# Patient Record
Sex: Male | Born: 1935
Health system: Southern US, Community
[De-identification: ages and names within clinical notes are randomized; demographics above are authoritative.]

## PROBLEM LIST (undated history)

## (undated) DIAGNOSIS — M549 Dorsalgia, unspecified: Secondary | ICD-10-CM

## (undated) DIAGNOSIS — I639 Cerebral infarction, unspecified: Secondary | ICD-10-CM

## (undated) DIAGNOSIS — Z8739 Personal history of other diseases of the musculoskeletal system and connective tissue: Secondary | ICD-10-CM

## (undated) DIAGNOSIS — R0600 Dyspnea, unspecified: Secondary | ICD-10-CM

## (undated) DIAGNOSIS — I10 Essential (primary) hypertension: Secondary | ICD-10-CM

## (undated) DIAGNOSIS — M2550 Pain in unspecified joint: Secondary | ICD-10-CM

## (undated) DIAGNOSIS — J189 Pneumonia, unspecified organism: Secondary | ICD-10-CM

## (undated) DIAGNOSIS — H9190 Unspecified hearing loss, unspecified ear: Secondary | ICD-10-CM

## (undated) DIAGNOSIS — Z8619 Personal history of other infectious and parasitic diseases: Secondary | ICD-10-CM

## (undated) DIAGNOSIS — I2510 Atherosclerotic heart disease of native coronary artery without angina pectoris: Secondary | ICD-10-CM

## (undated) DIAGNOSIS — K5790 Diverticulosis of intestine, part unspecified, without perforation or abscess without bleeding: Secondary | ICD-10-CM

## (undated) DIAGNOSIS — K52839 Microscopic colitis, unspecified: Secondary | ICD-10-CM

## (undated) DIAGNOSIS — K219 Gastro-esophageal reflux disease without esophagitis: Secondary | ICD-10-CM

## (undated) DIAGNOSIS — I959 Hypotension, unspecified: Secondary | ICD-10-CM

## (undated) DIAGNOSIS — E785 Hyperlipidemia, unspecified: Secondary | ICD-10-CM

## (undated) DIAGNOSIS — J449 Chronic obstructive pulmonary disease, unspecified: Secondary | ICD-10-CM

## (undated) DIAGNOSIS — C61 Malignant neoplasm of prostate: Secondary | ICD-10-CM

## (undated) DIAGNOSIS — N400 Enlarged prostate without lower urinary tract symptoms: Secondary | ICD-10-CM

## (undated) DIAGNOSIS — R739 Hyperglycemia, unspecified: Secondary | ICD-10-CM

## (undated) DIAGNOSIS — M1990 Unspecified osteoarthritis, unspecified site: Secondary | ICD-10-CM

## (undated) DIAGNOSIS — G459 Transient cerebral ischemic attack, unspecified: Secondary | ICD-10-CM

## (undated) DIAGNOSIS — I480 Paroxysmal atrial fibrillation: Secondary | ICD-10-CM

## (undated) DIAGNOSIS — H269 Unspecified cataract: Secondary | ICD-10-CM

## (undated) DIAGNOSIS — R531 Weakness: Secondary | ICD-10-CM

## (undated) HISTORY — DX: Hyperglycemia, unspecified: R73.9

## (undated) HISTORY — PX: EYE SURGERY: SHX253

## (undated) HISTORY — DX: Hyperlipidemia, unspecified: E78.5

## (undated) HISTORY — DX: Dorsalgia, unspecified: M54.9

## (undated) HISTORY — DX: Malignant neoplasm of prostate: C61

## (undated) HISTORY — DX: Hypotension, unspecified: I95.9

## (undated) HISTORY — PX: OTHER SURGICAL HISTORY: SHX169

## (undated) HISTORY — DX: Essential (primary) hypertension: I10

## (undated) HISTORY — DX: Transient cerebral ischemic attack, unspecified: G45.9

## (undated) HISTORY — DX: Gastro-esophageal reflux disease without esophagitis: K21.9

## (undated) HISTORY — DX: Diverticulosis of intestine, part unspecified, without perforation or abscess without bleeding: K57.90

## (undated) HISTORY — PX: COLONOSCOPY: SHX174

## (undated) HISTORY — DX: Personal history of other infectious and parasitic diseases: Z86.19

## (undated) HISTORY — DX: Atherosclerotic heart disease of native coronary artery without angina pectoris: I25.10

---
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
## 1995-12-19 HISTORY — PX: TOTAL HIP ARTHROPLASTY: SHX124

## 1998-12-18 DIAGNOSIS — M129 Arthropathy, unspecified: Secondary | ICD-10-CM | POA: Insufficient documentation

## 2000-12-18 DIAGNOSIS — K5730 Diverticulosis of large intestine without perforation or abscess without bleeding: Secondary | ICD-10-CM | POA: Insufficient documentation

## 2005-03-20 DIAGNOSIS — M4310 Spondylolisthesis, site unspecified: Secondary | ICD-10-CM | POA: Insufficient documentation

## 2007-05-10 ENCOUNTER — Other Ambulatory Visit: Payer: Self-pay

## 2007-05-10 ENCOUNTER — Emergency Department: Payer: Self-pay | Admitting: Emergency Medicine

## 2007-08-02 DIAGNOSIS — I10 Essential (primary) hypertension: Secondary | ICD-10-CM | POA: Insufficient documentation

## 2007-08-02 DIAGNOSIS — E785 Hyperlipidemia, unspecified: Secondary | ICD-10-CM | POA: Insufficient documentation

## 2007-09-10 ENCOUNTER — Ambulatory Visit: Payer: Self-pay | Admitting: Specialist

## 2008-03-04 ENCOUNTER — Ambulatory Visit: Payer: Self-pay | Admitting: Family Medicine

## 2008-03-11 ENCOUNTER — Ambulatory Visit: Payer: Self-pay | Admitting: Family Medicine

## 2008-09-28 DIAGNOSIS — J309 Allergic rhinitis, unspecified: Secondary | ICD-10-CM | POA: Insufficient documentation

## 2008-10-13 ENCOUNTER — Ambulatory Visit: Payer: Self-pay | Admitting: Family Medicine

## 2008-10-22 ENCOUNTER — Ambulatory Visit: Payer: Self-pay | Admitting: Family Medicine

## 2009-01-18 ENCOUNTER — Inpatient Hospital Stay: Payer: Self-pay | Admitting: Internal Medicine

## 2009-03-01 DIAGNOSIS — M545 Low back pain, unspecified: Secondary | ICD-10-CM | POA: Insufficient documentation

## 2009-03-17 ENCOUNTER — Ambulatory Visit: Payer: Self-pay | Admitting: Specialist

## 2009-04-07 ENCOUNTER — Ambulatory Visit: Payer: Self-pay | Admitting: Anesthesiology

## 2009-05-06 ENCOUNTER — Ambulatory Visit: Payer: Self-pay | Admitting: Anesthesiology

## 2009-06-02 ENCOUNTER — Ambulatory Visit: Payer: Self-pay | Admitting: Anesthesiology

## 2009-06-23 ENCOUNTER — Ambulatory Visit: Payer: Self-pay | Admitting: Anesthesiology

## 2009-12-18 HISTORY — PX: CORONARY ARTERY BYPASS GRAFT: SHX141

## 2010-03-14 ENCOUNTER — Ambulatory Visit: Payer: Self-pay | Admitting: Cardiology

## 2010-03-14 DIAGNOSIS — I2510 Atherosclerotic heart disease of native coronary artery without angina pectoris: Secondary | ICD-10-CM | POA: Insufficient documentation

## 2010-05-17 ENCOUNTER — Encounter: Payer: Self-pay | Admitting: Cardiology

## 2010-05-18 ENCOUNTER — Encounter: Payer: Self-pay | Admitting: Cardiology

## 2010-06-10 DIAGNOSIS — N529 Male erectile dysfunction, unspecified: Secondary | ICD-10-CM | POA: Insufficient documentation

## 2010-06-17 ENCOUNTER — Encounter: Payer: Self-pay | Admitting: Cardiology

## 2012-02-15 ENCOUNTER — Ambulatory Visit: Payer: Self-pay | Admitting: Unknown Physician Specialty

## 2012-02-15 LAB — HM COLONOSCOPY

## 2012-02-16 LAB — PATHOLOGY REPORT

## 2012-06-04 ENCOUNTER — Ambulatory Visit: Payer: Self-pay | Admitting: Family Medicine

## 2012-09-02 DIAGNOSIS — R3911 Hesitancy of micturition: Secondary | ICD-10-CM | POA: Insufficient documentation

## 2012-11-18 HISTORY — PX: PROSTATE SURGERY: SHX751

## 2012-12-02 ENCOUNTER — Ambulatory Visit: Payer: Self-pay | Admitting: Radiation Oncology

## 2012-12-18 ENCOUNTER — Ambulatory Visit: Payer: Self-pay | Admitting: Radiation Oncology

## 2013-01-18 ENCOUNTER — Ambulatory Visit: Payer: Self-pay | Admitting: Radiation Oncology

## 2013-01-21 ENCOUNTER — Ambulatory Visit: Payer: Self-pay | Admitting: Radiation Oncology

## 2013-01-21 LAB — BASIC METABOLIC PANEL
Anion Gap: 6 — ABNORMAL LOW (ref 7–16)
BUN: 29 mg/dL — ABNORMAL HIGH (ref 7–18)
Calcium, Total: 9 mg/dL (ref 8.5–10.1)
Chloride: 106 mmol/L (ref 98–107)
Co2: 28 mmol/L (ref 21–32)
Creatinine: 1.28 mg/dL (ref 0.60–1.30)
EGFR (African American): >60
EGFR (Non-African Amer.): 52 — ABNORMAL LOW
Glucose: 104 mg/dL — ABNORMAL HIGH (ref 65–99)
Osmolality: 286 (ref 275–301)
Potassium: 4.7 mmol/L (ref 3.5–5.1)
Sodium: 140 mmol/L (ref 136–145)

## 2013-01-21 LAB — HEMOGLOBIN: HGB: 14.7 g/dL (ref 13.0–18.0)

## 2013-01-29 ENCOUNTER — Ambulatory Visit: Payer: Self-pay | Admitting: Urology

## 2013-02-15 ENCOUNTER — Ambulatory Visit: Payer: Self-pay | Admitting: Radiation Oncology

## 2013-02-25 ENCOUNTER — Ambulatory Visit: Payer: Self-pay | Admitting: Radiation Oncology

## 2013-03-18 ENCOUNTER — Ambulatory Visit: Payer: Self-pay | Admitting: Radiation Oncology

## 2013-05-29 HISTORY — PX: OTHER SURGICAL HISTORY: SHX169

## 2013-05-29 HISTORY — PX: CARDIAC CATHETERIZATION: SHX172

## 2013-06-04 ENCOUNTER — Emergency Department: Payer: Self-pay

## 2013-06-04 LAB — TROPONIN I: Troponin-I: <0.02 ng/mL

## 2013-06-04 LAB — BASIC METABOLIC PANEL
Anion Gap: 7 (ref 7–16)
BUN: 20 mg/dL — ABNORMAL HIGH (ref 7–18)
Calcium, Total: 8.9 mg/dL (ref 8.5–10.1)
Chloride: 103 mmol/L (ref 98–107)
Co2: 26 mmol/L (ref 21–32)
Creatinine: 1.12 mg/dL (ref 0.60–1.30)
EGFR (African American): >60
EGFR (Non-African Amer.): >60
Glucose: 80 mg/dL (ref 65–99)
Osmolality: 274 (ref 275–301)
Potassium: 4.2 mmol/L (ref 3.5–5.1)
Sodium: 136 mmol/L (ref 136–145)

## 2013-06-04 LAB — CBC WITH DIFFERENTIAL/PLATELET
Basophil #: 0.1 10*3/uL (ref 0.0–0.1)
Basophil %: 1.1 %
Eosinophil #: 0.4 10*3/uL (ref 0.0–0.7)
Eosinophil %: 8 %
HCT: 43.6 % (ref 40.0–52.0)
HGB: 14.3 g/dL (ref 13.0–18.0)
Lymphocyte #: 1.7 10*3/uL (ref 1.0–3.6)
Lymphocyte %: 29.3 %
MCH: 28.7 pg (ref 26.0–34.0)
MCHC: 32.8 g/dL (ref 32.0–36.0)
MCV: 87 fL (ref 80–100)
Monocyte #: 0.6 x10 3/mm (ref 0.2–1.0)
Monocyte %: 10.3 %
Neutrophil #: 2.9 10*3/uL (ref 1.4–6.5)
Neutrophil %: 51.3 %
Platelet: 142 10*3/uL — ABNORMAL LOW (ref 150–440)
RBC: 4.98 10*6/uL (ref 4.40–5.90)
RDW: 15 % — ABNORMAL HIGH (ref 11.5–14.5)
WBC: 5.6 10*3/uL (ref 3.8–10.6)

## 2013-06-07 ENCOUNTER — Ambulatory Visit: Payer: Self-pay | Admitting: Cardiology

## 2013-06-07 HISTORY — PX: OTHER SURGICAL HISTORY: SHX169

## 2013-07-28 ENCOUNTER — Ambulatory Visit: Payer: Self-pay | Admitting: Radiation Oncology

## 2013-07-31 LAB — PSA: PSA: 1.2 ng/mL (ref 0.0–4.0)

## 2013-08-18 ENCOUNTER — Ambulatory Visit: Payer: Self-pay | Admitting: Radiation Oncology

## 2014-01-27 ENCOUNTER — Ambulatory Visit: Payer: Self-pay | Admitting: Radiation Oncology

## 2014-01-29 LAB — PSA: PSA: 0.7 ng/mL (ref 0.0–4.0)

## 2014-02-15 ENCOUNTER — Ambulatory Visit: Payer: Self-pay | Admitting: Radiation Oncology

## 2014-10-05 LAB — HEMOGLOBIN A1C: Hgb A1c MFr Bld: 5.7 % (ref 4.0–6.0)

## 2014-10-07 LAB — LIPID PANEL
Cholesterol: 104 mg/dL (ref 0–200)
HDL: 52 mg/dL (ref 35–70)
LDL Cholesterol: 40 mg/dL
Triglycerides: 61 mg/dL (ref 40–160)

## 2014-10-07 LAB — CBC AND DIFFERENTIAL
HCT: 43 % (ref 41–53)
Hemoglobin: 14.6 g/dL (ref 13.5–17.5)
Platelets: 156 10*3/uL (ref 150–399)
WBC: 5.5 10^3/mL

## 2014-10-07 LAB — BASIC METABOLIC PANEL
BUN: 17 mg/dL (ref 4–21)
Creatinine: 1.2 mg/dL (ref 0.6–1.3)
Glucose: 112 mg/dL
Potassium: 5.2 mmol/L (ref 3.4–5.3)
Sodium: 136 mmol/L — AB (ref 137–147)

## 2014-10-07 LAB — HEPATIC FUNCTION PANEL: ALT: 25 U/L (ref 10–40)

## 2014-10-07 LAB — HEMOGLOBIN A1C: Hgb A1c MFr Bld: 5.7 % (ref 4.0–6.0)

## 2014-12-02 ENCOUNTER — Ambulatory Visit: Payer: Self-pay | Admitting: Cardiology

## 2015-01-28 ENCOUNTER — Ambulatory Visit: Payer: Self-pay | Admitting: Radiation Oncology

## 2015-02-16 ENCOUNTER — Ambulatory Visit
Admit: 2015-02-16 | Disposition: A | Discharge status: Home / Self Care | Payer: Self-pay | Attending: Radiation Oncology | Admitting: Radiation Oncology

## 2015-03-03 ENCOUNTER — Inpatient Hospital Stay: Payer: Self-pay | Admitting: Internal Medicine

## 2015-03-08 ENCOUNTER — Emergency Department: Payer: Self-pay | Admitting: Emergency Medicine

## 2015-04-06 NOTE — Consult Note (Signed)
Reason for Visit: This 81 year old Male patient presents to the clinic for initial evaluation of  prostate cancer .   Referred by Dr. Serita Grit.  Diagnosis:   Chief Complaint/Diagnosis   81 year old male with clinical stage IIa (T1 C. N0 M0 presenting with a PSA in the 5 range and a Gleason score of 7 (3+4)   Pathology Report pathology report reviewed    Imaging Report appropriately no bone scan ordered    Referral Report clinical notes reviewed    Planned Treatment Regimen I-125 interstitial implant    HPI   patient is a healthy 81 year old male who presented with some increasing urinary frequency and urgency. His PSA on November 11, 2012 was 6.3. PSA dropped to 5 range with an. Antibiotic therapy although this prompted a transrectal ultrasound-guided biopsy. Biopsy confirmed Gleason 7 (3+4) adenocarcinoma of the prostate confined to the left lateral lobe at the base and apex. He has been seen by urology and discussion of treatment options have been made. Based on his life expectancy he was offered treatment and has elected to go ahead with brachytherapy. He is seen today in doing well. He has nocturia x1. No other systemic complaints at this time.  Past Hx:    Heart Bypass 2011:    Prostate Cancer:    Hypertension:    Appendectomy:    Hip Replacement - Left:   Past, Family and Social History:   Past Medical History positive    Cardiovascular CABG performed; coronary artery disease; hypertension; coronary bypass    Respiratory COPD    Past Surgical History appendectomy; cataract removal, hip replacement    Family History positive    Family History Comments family history of hypertension,mother and father both with MIs,rub with bladder cancer    Social History positive    Social History Comments patient is approximately 40-pack-year smoking history quit smoking in 1984. No EtOH abuse history    Additional Past Medical and Surgical History accompanied by his wife  today.   Allergies:   No Known Allergies:   Home Meds:  Home Medications: Medication Instructions Status  lorazepam 1 mg oral tablet 1 tab(s)  once a day, As Needed Active  ranitidine 150 mg oral tablet 1 tab(s) po once a day  Active  fish oil   once a day  Active  multivitamin 1 tab(s)  once a day  Active  metoprolol 75 mg.  1 cap(s)  2 times a day Active  Aspirin 325 mg.  1 tab(s) orally once a day Active  benazepril-hydrochlorothiazide 20 mg-12.5 mg oral tablet 1 tab(s) orally once a day Active  tramadol 50 mg oral tablet 1 tab(s) orally every 4 hours, As Needed- for Pain  Active  montelukast 10 mg oral tablet 1 tab(s) orally once a day (in the evening) Active  Spiriva 18 mcg inhalation capsule 1 each inhaled once a day Active  atorvastatin 40 mg oral tablet 1 tab(s) orally once a day (at bedtime) Active  tamsulosin 0.4 mg oral capsule 1 cap(s) orally once a day Active  Fish Oil 1200 mg oral capsule cap(s) orally once a day Active  magnesium gluconate 500 mg oral tablet 1 tab(s) orally once a day Active   Review of Systems:   General negative    Performance Status (ECOG) 0    Skin negative    Ophthalmologic see HPI    ENMT negative    Respiratory and Thorax negative    Cardiovascular negative    Gastrointestinal negative  Genitourinary see HPI    Musculoskeletal negative    Neurological negative    Psychiatric negative    Hematology/Lymphatics negative    Endocrine negative    Allergic/Immunologic negative    Review of Systems   according to nurse's notesPatient denies any weight loss, fatigue, weakness, fever, chills or night sweats. Patient denies any loss of vision, blurred vision. Patient denies any ringing  of the ears or hearing loss. No irregular heartbeat. Patient denies heart murmur or history of fainting. Patient denies any chest pain or pain radiating to her upper extremities. Patient denies any shortness of breath, difficulty breathing at  night, cough or hemoptysis. Patient denies any swelling in the lower legs. Patient denies any nausea vomiting, vomiting of blood, or coffee ground material in the vomitus. Patient denies any stomach pain. Patient states has had normal bowel movements no significant constipation or diarrhea. Patient denies any dysuria, hematuria or significant nocturia. Patient denies any problems walking, swelling in the joints or loss of balance. Patient denies any skin changes, loss of hair or loss of weight. Patient denies any excessive worrying or anxiety or significant depression. Patient denies any problems with insomnia. Patient denies excessive thirst, polyuria, polydipsia. Patient denies any swollen glands, patient denies easy bruising or easy bleeding. Patient denies any recent infections, allergies or URI. Patient "s visual fields have not changed significantly in recent time.  Nursing Notes:  Nursing Vital Signs and Chemo Nursing Nursing Notes: *CC Vital Signs Flowsheet:   16-Dec-13 09:19   Temp Temperature 97.2   Pulse Pulse 60   Respirations Respirations 20   SBP SBP 114   DBP DBP 63   Pain Scale (0-10)  0   Current Weight (kg) (kg) 90.9   Height (cm) centimeters 166.5   BSA (m2) 1.9   Physical Exam:  General/Skin/HEENT:   General normal    Skin normal    Eyes normal    ENMT normal    Head and Neck normal    Additional PE well-developed slightly obese male in NAD appears younger than stated age. Lungs are clear to A&P cardiac examination shows regular rate and rhythm. On rectal exam rectal sphincter tone is good. Prostate has slight irregularity of the left lateral lobe although the sulcus is preserved. Left lateral lobe is somewhat more firm than the right lateral lobe may be from prior biopsy. No other rectal abnormalities identified.   Breasts/Resp/CV/GI/GU:   Respiratory and Thorax normal    Cardiovascular normal    Gastrointestinal normal    Genitourinary normal    MS/Neuro/Psych/Lymph:   Musculoskeletal normal    Neurological normal    Lymphatics normal   Assessment and Plan:  Impression:   clinical stage II adenocarcinoma prostate and 81 year old male.  Plan:   at this time I agree patient be a good candidate for I-125 interstitial implant to deliver 145 gray to his prostatic volume. I believe his intermediate risk with Gleason 7 (3+4) score. Do not see need for Lupron therapy in his case. Do not see need for pelvic lymph node external beam treatment. Will set up a volume study of in next several weeks and use BrachyVision treatment planning to perform a treatment plan for implant. Risks and benefits of treatment including risk of urinary frequency and urgency, possible diarrhea, possible fatigue, were all explained in detail to the patient. We will arrange his Lyme study and proceed with implant for point on.  I would like to take this opportunity to thank you for  allowing me to continue to participate in this patient's care.  CC Referral:   cc: Dr. Bernardo Heater, Dr. Lelon Huh   Electronic Signatures: Baruch Gouty, Roda Shutters (MD)  (Signed 16-Dec-13 11:18)  Authored: HPI, Diagnosis, Past Hx, PFSH, Allergies, Home Meds, ROS, Nursing Notes, Physical Exam, Encounter Assessment and Plan, CC Referring Physician   Last Updated: 16-Dec-13 11:18 by Armstead Peaks (MD)

## 2015-04-09 NOTE — H&P (Signed)
PATIENT NAME:  James [name], James [name] MR#:  [id number] DATE OF BIRTH:  02-14-1929  DATE OF ADMISSION:  06/04/2013  PRIMARY CARE PHYSICIAN:  Kirstie Peri. Caryn Section, MD  CARDIOLOGIST:  Javier Docker. Ubaldo Glassing, MD  CHIEF COMPLAINT:  "My wife thinks I had slurred a few words."   HISTORY OF PRESENT ILLNESS:  James [name] is a very pleasant 81 year old Caucasian gentleman with history of CAD status post bypass in 2011, history of prostate cancer being treated at the [facility] and hypertension who comes in today because the patient's wife felt that the patient slurred a few words while having dinner and talking at the same time. The patient tells me he did not feel any of the symptoms. Denies any slurred speech, dysarthria or any trouble getting words out. The patient tells me "I am absolutely fine and I am just at my baseline." In the Emergency Room, the patient did not have any numbness, weakness, any dysarthria, dysphasia or any blurred vision. He is hemodynamically stable.   The patient has been having some mild shortness of breath as well doing heavy lifting at home and he was evaluated as outpatient by Dr. Ubaldo Glassing by a nuclear stress test. He has a followup appointment tomorrow on June 05, 2013. The patient did take an aspirin today in the morning. In the ER, the patient's CT of the head is normal.   PAST MEDICAL HISTORY:   1.  Cardiac bypass in 2011 with history of CAD.  2.  Prostate cancer being treated by Dr. Baruch Gouty at the [facility].  3.  Hypertension.  4.  Appendectomy.  5.  Left hip replacement.   ALLERGIES:  No known drug allergies.   MEDICATIONS:   1.  Aspirin 325 mg p.o. daily.  2.  Atorvastatin 40 mg at bedtime.  3.  Benazepril/hydrochlorothiazide 20/12.5 mg p.o. daily.  4.  Fish oil 1200 mg p.o. daily.  5.  Flonase 50 mcg 2 sprays daily.  6.  Lorazepam 1 mg at bedtime as needed for sleep.  7.  Magnesium gluconate 500 mg daily.  8.  Metoprolol tartrate 50 mg b.i.d.  9.  Singulair 10 mg daily.   10.  Multivitamin daily.  11.  Ranitidine 150 mg daily.  12.  Spiriva 18 mcg inhalation daily.  13.  Tamsulosin 0.4 mg daily.  14.  Tramadol 50 mg daily as needed for pain.   SOCIAL HISTORY:  Married lives with wife at home.   FAMILY HISTORY:  Brother deceased of bladder cancer at age 45, mother deceased with MI in 4s, father deceased with MI in 30s.   SOCIAL HISTORY:  Ex-smoker 15-pack-year history. He drinks 3 to 4 beers per week.   REVIEW OF SYSTEMS:  CONSTITUTIONAL: No fever, fatigue, weakness.  EYES: No blurred or double vision.  ENT: No tinnitus, ear pain, hearing loss.  RESPIRATORY: No cough, wheeze, hemoptysis or COPD.  CARDIOVASCULAR: No chest pain, orthopnea. Positive for some mild shortness of breath on exertion. No palpitations, syncope.  GASTROINTESTINAL: No nausea, vomiting, diarrhea, abdominal pain or hematemesis.  GENITOURINARY: No dysuria, hematuria.  SKIN: No acne or rash.  MUSCULOSKELETAL: Positive for arthritis.  NEUROLOGIC: No CVA or TIA, no dysarthria, tremors or epilepsy. No headache. No numbness or weakness.  PSYCHIATRIC: No anxiety, depression or bipolar disorder. All other systems are reviewed and negative.   PHYSICAL EXAMINATION: GENERAL: The patient is awake, alert and oriented x 3 not in acute distress.  VITAL SIGNS: Afebrile, pulse is 97, blood pressure is 122/78  and sats are 96% on room air.  HEENT: Atraumatic, normocephalic. PERLA. EOM intact. Oral mucosa is moist.  NECK: Supple. No JVD. No carotid bruit.  RESPIRATORY: Clear to auscultation bilaterally. No rales, rhonchi, respiratory distress or labored breathing.  CARDIOVASCULAR: Both the heart sounds are normal. Rate, rhythm is regular. PMI not lateralized. Chest is nontender.  EXTREMITIES: Good pedal pulses, good femoral pulses. No lower extremity edema.  ABDOMEN: Soft, benign, nontender. No organomegaly.  NEUROLOGIC: Grossly intact. Cranial nerves II through XII normal. No dysarthria. No facial  droop. No nystagmus.  MOTOR SYSTEM EXAM: In both upper and lower extremity 5/5 strength in all 4 extremities. Reflexes 1+ in both upper and lower extremities. Plantars downgoing. Sensory exam within normal limits.  SKIN: Warm and dry.  PSYCHIATRIC: The patient is alert and oriented x 3.   DIAGNOSTIC DATA:  Chest x-ray: No acute cardiopulmonary abnormality, mild right basilar opacities likely secondary to atelectasis. CT of the head: No acute intracranial process. Basic metabolic panel is within normal limits. CBC is within normal limits. Troponin is 0.02.   ASSESSMENT:  An 81 year old male with history of coronary artery disease status post coronary artery bypass graft and hypertension who comes in with:  1.  Suspected slurred speech, very transient, that was noted by the patient's wife. The patient is not aware of any symptoms or any dysarthria. I do not think this is a transient ischemic attack. I had recommended the patient to continue his aspirin and continue to monitor symptoms. If they get worse, either come to the Emergency Room or call primary care physician's office depending on his symptoms. There are no neurological deficits noted. CT of the head is negative. His blood pressure is within normal limits. In ER stay, the patient did not have any further symptoms of dysarthria.  2.  Coronary artery disease status post coronary artery bypass graft. The patient will continue his aspirin, statins and will continue his ACE inhibitors.  3.  Hypertension. The patient is on metoprolol and benazepril.  4.  Hyperlipidemia on atorvastatin.  5.  History of prostate cancer. The patient follows with Dr. Baruch Gouty.  6.  Shortness of breath on exertion. The patient had a recent Myoview stress test with Dr. Ubaldo Glassing. He will follow up with Dr. Ubaldo Glassing as an outpatient. He has a scheduled appointment on June 05, 2013. The patient's cardiac enzymes are negative and EKG does not show any acute changes. He does not have any  chest pain.   We will discharge the patient to go home. The patient is agreeable to it. He feels absolutely at baseline and did not even feel any of those symptoms of slurred speech that his wife noted prior to coming to the Emergency Room. Discharge plan was discussed with the patient.   TIME SPENT:  45 minutes.    ____________________________ Hart Rochester Posey Pronto, MD sap:si D: 06/04/2013 23:02:00 ET T: 06/04/2013 23:37:20 ET JOB#: [id number]  cc: Kiernan Farkas A. Posey Pronto, MD, <Dictator> Kirstie Peri. Caryn Section, MD Javier Docker Ubaldo Glassing, MD  Ilda Basset MD ELECTRONICALLY SIGNED 06/11/2013 14:23

## 2015-04-09 NOTE — H&P (Signed)
PATIENT NAME:  James [name], James [name] MR#:  [id number] DATE OF BIRTH:  December 20, 1928  DATE OF ADMISSION:  01/29/2013  HISTORY OF PRESENT ILLNESS: James [name] is an 81 year old male who presented with increasing urinary frequency and urgency and his PSA jumped to 6.3, in November 2013. This prompted transrectal ultrasound-guided biopsy which showed a Gleason 7 (3 + 4) adenocarcinoma confined to the left lateral lobe, at the base and apex. Treatment options were discussed and the patient opted for I-125 interstitial implant. Seen today to go ahead with his volume study. The patient is doing well. Still has some slight frequency, urgency and nocturia. No other complaints.   PAST MEDICAL HISTORY: 1. Heart bypass in 2011.  2. Hypertension.  3. Appendectomy.  4. Left hip replacement.  5. Prostate cancer, as described above.  6. Coronary artery disease.  7. Hypertension.  8. COPD.   FAMILY HISTORY: Family history of hypertension. Mother and father both with MIs. Also family history of bladder cancer.   SOCIAL HISTORY: A 40 pack-year smoking history, quit smoking in 1984. No EtOH abuse history.   Accompanied by his wife today.   ALLERGIES: No known medical allergies.   HOME MEDICATIONS: 1. Lorazepam 1 mg oral tablet once a day as needed. 2. Ranitidine 150 mg oral tablet 1 tablet once a day.  3. Fish oil once a day.  4. Multivitamin once a day.  5. Metoprolol 75 mg 1 capsule 2 times a day.  6. Baby aspirin 1 tablet orally once a day. The patient has been asked to discontinue this two weeks prior to his implant.  7. Benazepril/hydrochlorothiazide 20/12.5 mg one tablet orally once a day. 8. Tramadol 50 mg oral tablet 1 tablet every 4 hours as needed.  9. Montelukast 10 mg oral tablet 1 tablet orally once a day.  10. Spiriva 18 mcg inhalation capsule 1 each inhaled once a day.  11. Atorvastatin 40 mg orally 1 tablet once a day.  12. Flomax 0.4 mg oral capsule 1 capsule orally once a day.  13. Fish oil 1200  mg oral capsule once a day.  14. Magnesium gluconate 500 mg oral tablet once a day.   REVIEW OF SYSTEMS: Unchanged from prior examination dated 12/02/2012.   PHYSICAL EXAMINATION: VITAL SIGNS: Vital signs are stable. Temperature is 97.2, pulse 60, respirations 20 per minute and blood pressure 114/63.  GENERAL: Well-developed, well-nourished slightly obese male in no acute distress.  HEENT: PERRLA. EOMs intact. Disks not visualized.  NECK: Clear without evidence of cervical or supraclavicular adenopathy.  PULMONARY: Lungs are clear to auscultation and percussion.  CARDIAC: Regular rate and rhythm without murmur, rub or thrill.  ABDOMEN: Benign with no organomegaly or masses noted.  NEUROLOGIC: Motor, sensory and DTR levels are equal and symmetric in the upper and lower extremities. No peripheral adenopathy or edema is identified. Neurologically he is intact.   IMPRESSION: Adenocarcinoma of the prostate, clinical stage II-A (T1C, N0, M0) presenting with a PSA in the 5 range and a Gleason score of 7 (3 + 4).   PLAN: At this point in time, we are taking the patient to the OR for volume study. We will plan on delivering 145 Gy to his prostatic volume. Risks and benefits of treatment were reviewed with the patient and his wife and consent was signed. We will follow up with a CT scan a month after implant for quality assurance purposes.   I would like to thank you for allowing me to continue to participate in  his care.   ____________________________ Armstead Peaks, MD gsc:sb D: 01/01/2013 13:21:00 ET T: 01/01/2013 14:21:40 ET JOB#: [id number]  cc: Armstead Peaks, MD, <Dictator>  Armstead Peaks MD ELECTRONICALLY SIGNED 02/18/2013 11:10

## 2015-04-09 NOTE — Op Note (Signed)
PATIENT NAME:  James [name], James [name] MR#:  [id number] DATE OF BIRTH:  11/13/1929  DATE OF PROCEDURE:  01/29/2013  PREOPERATIVE DIAGNOSIS: Clinical stage T1C, moderate risk adenocarcinoma of the prostate.   POSTOPERATIVE DIAGNOSIS: Clinical stage T1C, moderate risk adenocarcinoma of the prostate.   PROCEDURES:  1.  I-125 transperineal interstitial implant.  2.  Cystoscopy.   SURGEON: Scott C. Bernardo Heater, M.D.   RADIATION ONCOLOGIST: Dr. Baruch Gouty.   ANESTHESIA: General/LMA.   INDICATIONS: An 81 year old male with a PSA of 6.3 and biopsy positive for Gleason 3 + 4 adenocarcinoma of the prostate. He is healthy and elected curative treatment. He presents for brachytherapy and is approximately 1 month status post brachytherapy planning study.   DESCRIPTION OF PROCEDURE: He was taken to the operating room where a general anesthetic was administered via LMA. He was then placed in the low lithotomy position and his external genitalia were prepped and draped in the usual fashion. A 16-French Foley was placed to gravity drainage. A transrectal ultrasound probe was lubricated and passed per rectum. Rectal probe was placed into the stepping unit and the prostate image was positioned to match that obtained during the brachytherapy planning study. Preloaded needles were then passed under ultrasound guidance to coordinates determined by the brachytherapy planning study. The needle position was verified on sagittal views. 18 needles were passed with a total number of  97 seeds. At completion of the procedure, the Foley catheter was removed. A 21-French cystoscope was passed under direct vision. There was moderate lateral lobe enlargement present. No seeds were seen within the urethra, prostatic urethra or bladder. Cystoscope was removed and Foley catheter was replaced. The patient was taken to the PACU in stable condition. There were no complications. EBL minimal.    ____________________________ Ronda Fairly. Bernardo Heater,  MD scs:aw D: 01/29/2013 09:17:05 ET T: 01/29/2013 09:31:57 ET JOB#: [id number]  cc: Nicki Reaper C. Bernardo Heater, MD, <Dictator> Abbie Sons MD ELECTRONICALLY SIGNED 02/05/2013 7:57

## 2015-04-12 ENCOUNTER — Ambulatory Visit
Admit: 2015-04-12 | Disposition: A | Discharge status: Home / Self Care | Payer: Self-pay | Attending: Gastroenterology | Admitting: Gastroenterology

## 2015-04-18 NOTE — Discharge Summary (Signed)
PATIENT NAME:  James [name], James [name] MR#:  [id number] DATE OF BIRTH:  [birth year]  DATE OF ADMISSION:  03/03/2015 DATE OF DISCHARGE:  03/04/2015  DISCHARGE DIAGNOSES:  1.  Sinus bradycardia secondary to metoprolol.  2.  Mild chest pain due to chronic angina versus gastroesophageal reflux disease.  3.  Coronary artery disease.  4.  Benign prostatic hypertrophy.  5.  Hypertension.  6.  Chronic obstructive pulmonary disease.  7.  Hyperlipidemia.   CONSULTATIONS: Dwayne D. Clayborn Bigness, MD and Dionisio David, MD of cardiology.   DISCHARGE MEDICATIONS: Please refer to medicine reconciliation.   DISCHARGE INSTRUCTIONS: Low sodium, regular consistency diet. Activity as tolerated.  FOLLOWUP: With primary care physician and Dr. Ubaldo Glassing in 1 to 2 weeks.   IMAGING STUDIES: Include a chest x-ray which shows signs of COPD, nothing acute.   ADMITTING HISTORY AND PHYSICAL: Please see detailed H and P dictated previously. In brief, an 81 year old male patient who presented to the hospital complaining of some chest pain. He was found to have bradycardia. The patient was admitted to rule out acute coronary syndrome.   HOSPITAL COURSE: The patient was admitted onto telemetry floor. His metoprolol was stopped. The patient did not have any further bradycardia in the hospital once the metoprolol was stopped. Cardiac enzymes have remained stable. He did have insignificant cardiac catheterization 1 month prior, was seen by Dr. Clayborn Bigness, was advised to stop the metoprolol and discharged home. At this point, the patient is back to normal. No bradycardia. No chest pain. Cardiac enzymes are normal. Has ambulated well.  PHYSICAL EXAMINATION: HEART: S1, S2 heard. LUNGS: Clear. EXTREMITIES: No edema.   DISPOSITION: He is being discharged home in stable condition.   DISCHARGE TIME SPENT ON DAY OF DISCHARGE: 40 minutes.   ____________________________ James Alf Iaan Oregel, MD srs:ST D: 03/06/2015 18:26:40 ET T: 03/07/2015  02:29:32 ET JOB#: [id number]  cc: James Heimlich R. Luella Gardenhire, MD, <Dictator> James Carp MD ELECTRONICALLY SIGNED 03/09/2015 2:24

## 2015-04-18 NOTE — H&P (Signed)
PATIENT NAME:  James [name], James [name] MR#:  [id number] DATE OF BIRTH:  [birth year]  DATE OF ADMISSION:  03/03/2015  PRIMARY CARE PHYSICIAN Kirstie Peri. Fisher, MD  CARDIOLOGIST:  [facility]  HISTORY OF PRESENT ILLNESS:  The patient is an 81 year old Caucasian male with past medical history significant for history of coronary artery disease status post coronary artery bypass grafting in 2011, who presents to the hospital with complaints of chest pain. Apparently, the patient was working in the yard as well as in the garage at around 5:00 p.m. today, but no strenuous work. He suddenly started having chest pains. The pain was described as in the lower sternal area. It was sharp, intermittent pain with no radiation and no improvement with deep breathing or walking around. His pain was as high as 8 out of 10 in intensity. It lasted approximately 30 minutes in total. He also noted significant sweating, as well as he was feeling somewhat short of breath. He felt nauseated, but he had no syncopal episode. He felt somewhat presyncopal. EMS was called by his wife, and the patient was noted to have a heart rate in the 20s. His systolic blood pressure was in the 60s at that time. Pacing was initiated, and the patient was brought to the Emergency Room for further evaluation. While en route, his pain abated.   On arrival to the Emergency Room, his heart rate was already in the 74J and his systolic blood pressure was in the 80s. He was given IV fluids and his pacer was taken off. He now remains in sinus rhythm at a rate of 80 and his systolic blood pressure is in the 130s. He has no chest pains at present. Apparently, EMS felt that he had third-degree AV block when they initiated pacing. The patient has been having diarrheal stool for the past 2 weeks. He was initiated on Cipro and Flagyl by his primary care physician, and while he was taking the Cipro and Flagyl, his diarrhea subsided; however, as soon as he stopped these  medication and antibiotic, his diarrhea started again. On arrival to the Emergency Room, his labs revealed acute renal failure, and the hospitalist services were contacted for admission.   PAST MEDICAL HISTORY:  Significant for history of prostate carcinoma status post iodine-121 implant, history of hypertension, coronary artery disease status post coronary artery bypass grafting in 2011, history of COPD but not on oxygen therapy at home, history of lower back pains.   MEDICATIONS:  Per medical records, the patient is on aspirin 325 mg p.o. daily; atorvastatin 80 mg p.o. daily; benazepril/HCTZ 20/12.5 mg p.o. once daily; fluticasone nasal spray 2 sprays daily; lansoprazole 30 mg p.o. daily; lorazepam 1 mg daily as needed; magnesium oxide 400 mg p.o. daily; metoprolol tartrate 50 mg 1-1/2 tablets, which would be 75 mg, once daily; metoprolol tartrate 25 mg in the evening; montelukast 10 mg p.o. daily; multivitamins once daily; Naprosyn 500 mg p.o. twice daily; Ocuvite once daily; Spiriva 2 inhalations daily; Symbicort 80/4.5 two puffs twice daily; tamsulosin 0.4 mg once daily; tramadol 50 mg every 6 hours as needed.   PAST SURGICAL HISTORY:  The patient has had no surgeries.   ALLERGIES:  None.   FAMILY HISTORY:  Coronary artery disease in both parents. The patient's older brother had bladder cancer. The patient's sister as well as brother had coronary artery disease at a relatively early age; the patient's sister was in her 17s and the patient's brother as well as parents were in their  2s.   SOCIAL HISTORY:  The patient is married and has 1 son, who lives in [place]. The patient lives with his wife. He used to smoke 1 pack a day for years and quit approximately 30 years ago. He drinks a couple of beers a day.   REVIEW OF SYSTEMS:   CONSTITUTIONAL:  Positive for fatigue and weakness and pains in the chest earlier today. Denies any high fevers, chills, or weight loss or gain.  EYES:  In regards to  the eyes, denies any blurry vision, double vision, or glaucoma. Cataract:  Left is removed. Right eye shows sometimes halo.  EARS, NOSE, AND THROAT:  Denies any tinnitus, allergies, epistaxis, sinus pain, dentures, or difficulty swallowing.  RESPIRATORY:  Sometimes coughing, sputum production, some pale yellowish phlegm. Denies any wheezes, asthma, or COPD.  CARDIOVASCULAR:  Lower extremity swelling in his ankles. Also, intermittent arrhythmias, palpitations, and dyspnea on exertion, especially whenever he walks; however, the patient's oxygen saturations on exertion are normal. Chest pains earlier today. Denies orthopnea, PND. GASTROINTESTINAL:  Nausea today with chest pains, abdominal pain as well as diarrheal stool over the past 2 weeks. Pain is described as in the abdomen, gas pains, some tenesmus prior to defecation. Denies any vomiting, hematemesis, rectal bleeding, or changes in bowel habits. GENITOURINARY:  Denies dysuria, hematuria, frequency, or incontinence.  ENDOCRINE:  Denies polydipsia, nocturia, thyroid problems, heat or cold intolerance, or thirst.  HEMATOLOGIC:  Denies anemia, easy bruising or bleeding, or swollen glands.  SKIN:  Denies acne, rashes, lesions, or change in moles.  MUSCULOSKELETAL:  Denies arthritis, cramps, or swelling.  NEUROLOGIC:  Denies numbness, epilepsy, or tremor.  PSYCHIATRIC: Denies anxiety, insomnia, or depression.   PHYSICAL EXAMINATION: VITAL SIGNS:  On arrival to the hospital, the patient's vital signs show temperature of 98, pulse 72, respirations 20, and blood pressure 100/69, although initially on arrival to the Emergency Room, the patient's vital signs showed a heart rate in the 80s, respirations 22, blood pressure 78/59, and saturation of 97% on oxygen therapy.  GENERAL:  This is a well-developed, well-nourished, obese Caucasian male in no significant distress, lying on the stretcher.  HEENT:  Pupils are equal and reactive to light. Extraocular  movements are intact. No icterus or conjunctivitis. He has normal hearing. No pharyngeal erythema. Mucosa is moist.  NECK:  No masses. Supple and nontender. Thyroid is not enlarged. No adenopathy. No JVD or carotid bruits bilaterally. Full range of motion.  LUNGS:  Clear to auscultation with diminished breath sounds at the bases. Few rhonchi are heard. Also, some wheezing is heard. No labored inspirations, increased effort, dullness to percussion, or overt respiratory distress.  CARDIOVASCULAR:  S1 and S2 appreciated. Rhythm is regular. PMI is not lateralized. Chest is nontender to palpation. There are 1+ pedal pulses. The patient does have 1+ lower extremity edema. No calf tenderness or cyanosis noted.  ABDOMEN:  Soft and nontender. Bowel sounds are present. No hepatosplenomegaly or masses are noted. The patient does have mild discomfort in the abdomen, but no rebound or guarding is noted.  MUSCULOSKELETAL:  Able to move all extremities. No cyanosis, degenerative joint disease, or kyphosis.  SKIN:  Does not reveal any rashes, lesions, erythema, nodularity, or induration. It is warm and dry to palpation.  LYMPHATIC:  No adenopathy in the cervical region.  NEUROLOGICAL:  Cranial nerves are grossly intact. Sensory is intact. No dysarthria or aphasia.  PSYCHIATRIC:  The patient is alert and oriented to time, person, and place. Cooperative. Memory is  good. No significant confusion, agitation, or depression noted.   LABORATORY DATA:  BMP showed glucose of 138, BUN and creatinine were 31 and 2.17, otherwise unremarkable. The patient's troponin was less than 0.03. CBC:  Within normal limits. Coagulation panel was unremarkable. EKG showed normal sinus rhythm at 80 beats per minute; left axis deviation; nonspecific intraventricular block; inferior infarct, age indeterminate; possible anterolateral infarct, age indeterminate. No acute ST-T changes, however, were noted. No significant change since prior EKG done in  June 2014.   RADIOLOGIC STUDIES:  Chest x-ray, portable, single view, on 03/03/2015, showed asymmetric elevation of the right hemidiaphragm.   ASSESSMENT AND PLAN: 1.  Chest pain. Admit the patient to the medical floor. Check cardiac enzymes x 3. Continue on aspirin and nitroglycerin as long as the patient's blood pressure remains stable and also heparin IV. Cardiology consultation was obtained by Emergency Room MD. We will follow the patient clinically. No beta blocker due to bradycardia.  2.  Bradycardia of unclear etiology at this time, concerning for cardiac event. We will hold beta blockers at this time.  3.  Acute renal failure due to acute tubular necrosis likely. We will hold the patient's nonsteroidal anti-inflammatory medications and also ACE inhibitor. We will continue IV fluids. We will follow creatinine. We will get ultrasound of kidneys as well as urinalysis to rule out urinary tract infection. 4.  Diarrhea. Get Clostridium difficile as well as stool cultures. We will start the patient on Imodium if the patient's Clostridium difficile and stool cultures are negative.  5.  Hypertension. We will continue IV fluids and hold blood pressure medications.   TIME SPENT:  50 minutes on the patient.    ____________________________ Theodoro Grist, MD rv:nb D: 03/03/2015 20:26:05 ET T: 03/04/2015 01:01:52 ET JOB#: [id number]  cc: Theodoro Grist, MD, <Dictator> Kirstie Peri. Caryn Section, MD  Theodoro Grist MD ELECTRONICALLY SIGNED 03/21/2015 20:01

## 2015-04-18 NOTE — Consult Note (Signed)
PATIENT NAME:  James [name], James [name] MR#:  [id number] DATE OF BIRTH:  Jul 18, 1929  DATE OF CONSULTATION:  03/03/2015  REFERRING PHYSICIAN:   CONSULTING PHYSICIAN:  Dionisio David, MD  INDICATION FOR CONSULTATION: Acute onset of chest pain and severe bradycardia associated with external pacing on arrival to the Emergency Room.     HISTORY OF PRESENT ILLNESS:  This is an 81 year old white male with past medical history of CABG 5 vessel at [facility] in 2011, apparently had a cardiac catheterization 1 month ago by Dr. Ubaldo Glassing that was unremarkable as per patient. The patient states that he normally has blood pressure systolic around 914 and the heart rate lately been running in the 40s and all of a sudden today he started having severe chest pain, a retrosternal pressure-type associated with shortness of breath and diaphoresis and he checked his pulse which was very sluggish and slow, thus he called 911. When EMS arrived according to the strips the patient was apparently having a heart rate in the 36 range with AV dissociation and they started externally pacing him. After giving 2 mg of Versed the patient's chest pain started subsiding. Upon arrival in the Emergency Room his blood pressure systolic was 60. I was called to evaluate the patient.  Decision was being made whether to transfer the patient to [facility] or [facility] because it was 5:30. I was asked to evaluate the patient emergently.  It turns out the patient is actually followed by Dr. Ubaldo Glassing and [facility]. At this time the patient denies any chest pain or shortness of breath, feeling much better blood pressure systolic is 88. He is getting 150 mL of IV fluid, we will increase the IV fluid to wide open. EKG was done which shows normal sinus rhythm with  , poor R wave progression suggestive of old anteroseptal wall MI, also Q waves in leads 2, 3, aVF, suggestive of old inferior wall MI, there is no evidence of ST elevation.   PAST MEDICAL HISTORY: As  mentioned has a history of CABG 5 vessel in 2011, had a negative cardiac catheterization 1 month ago, he has a history of hyperlipidemia, hypertension.   MEDICATIONS: Important that the fact that he is on metoprolol.   PHYSICAL EXAMINATION:  GENERAL: He is alert and oriented x 3, in no acute distress. He is not being paced externally. VITAL SIGNS: His blood pressure right now is 88 systolic. Pulse is 88, respirations are 14. He is afebrile.  HEENT:  No JVD.  LUNGS: Clear.  HEART: Regular rate and rhythm. Normal S1, S2. No audible murmur.  ABDOMEN: Soft, nontender, positive bowel sounds.  EXTREMITIES: No pedal edema.  NEUROLOGIC: The patient appears to be intact.   LABORATORY DATA:  His EKG shows normal sinus rhythm, 80 beats per minute, left axis deviation, nonspecific intraventricular conduction delay with poor R wave progression, old inferior wall MI, no ST elevation. Initial troponin is negative, however creatinine is 2.17, sodium is 139. BUN is 31. Glucose is 138. His white count is 7.6, H and H  is 14.6/44.6, platelet count is not available.   ASSESSMENT AND PLAN: The patient had unstable angina with questionable AV dissociation with a heart rate of 36.  I looked at the strips. He was given 2 mg of Versed and had externally been paced. When I arrived we removed the pacemaker and we did another EKG and he is apparently back in sinus rhythm. He is feeling much better. Advise giving wide open  fluid until blood pressure systolic is over 096. Also we will get an echocardiogram. His first set of cardiac enzymes are negative and EKG does not have any ST elevation, and he is no longer having chest pain, in fact this is the first time he ever had chest pain.  His cardiac catheterization was done by Dr. Ubaldo Glassing 1 month ago which was unremarkable after abnormal stress test. We will look at that and also will turn over the patient to Dr. Ubaldo Glassing in the morning.  I think right now we will just give IV fluid,  aspirin, heparin, and avoid nitrates, and will have the [facility] cardiology follow the patient in the morning.   Thank you very much for the referral.     ____________________________ Dionisio David, MD sak:bu D: 03/03/2015 18:00:10 ET T: 03/03/2015 18:43:33 ET JOB#: [id number]  cc: Dionisio David, MD, <Dictator> Dionisio David MD ELECTRONICALLY SIGNED 03/30/2015 13:32

## 2015-04-18 NOTE — Consult Note (Signed)
PATIENT NAME:  James [name], James [name] MR#:  [id number] DATE OF BIRTH:  12/21/1928  DATE OF CONSULTATION:  03/04/2015  REFERRING PHYSICIAN:  Theodoro Grist, MD CONSULTING PHYSICIAN:  Dwayne D. Clayborn Bigness, MD  PRIMARY PHYSICIAN: Kirstie Peri. Caryn Section, MD  CARDIOLOGY CONSULTATION   INDICATION: Bradycardia, hypotension.   HISTORY OF PRESENT ILLNESS: The patient is an 81 year old white male with history of coronary artery disease, status post coronary bypass surgery in 2011, presenting to the hospital with complaints of chest pain. The patient was working in the yard around 5 p.m., some strenuous work. The patient suddenly started having chest pains. The patient described left sternal, was sharp, intermittent, no radiation. Pain was as high as an 8/10, lasted about 30 minutes. Also complained of sweating. He was short of breath, felt nauseous, did not have any syncope. EMS was called. Heart rate was low, less than 50. Reportedly, the patient was paced and brought to the Emergency Room for evaluation. In the Emergency Room, the patient was found to have heart rates in the 50s, blood pressure was in the 80s. He was given IV fluids. Rate appeared to be sinus. Blood pressure in the 30s after the fluid bolus. He did reasonably well. The patient has had some diarrhea off and on for about 2 weeks. Placed on Cipro and Flagyl. Now he came to the Emergency Room for evaluation.   PAST MEDICAL HISTORY: Prostate cancer, hypertension, coronary artery disease, COPD, lower back pain.   PAST SURGICAL HISTORY: Bypass surgery, prostate implant in the past.    ALLERGIES: None.   FAMILY HISTORY: Coronary artery disease, bladder cancer.   SOCIAL HISTORY: Married. One son who lives in [place]. The patient lives with his wife. Quit smoking. Still drinks beer occasionally.   MEDICATIONS: Aspirin 325 a day, Lipitor 80 a day, benazepril/HCTZ 20/12.5, fluticasone spray 2 sprays a day, lansoprazole 30 mg a day, Ativan 1 mg as needed,  magnesium 400 mg daily, metoprolol 75 mg a day, Naprosyn 500 twice a day, Spiriva daily, Symbicort 80/4.5 twice a day, tamsulosin 0.4 daily, tramadol 50 q. 6.   REVIEW OF SYSTEMS: No blackout spells or syncope. No nausea, vomiting. No fever, chills. No weight loss or weight gain. No hemoptysis, hematemesis. No bright red blood per rectum. Skin exam normal.   PHYSICAL EXAMINATION:  VITAL SIGNS: Blood pressure 100/70, pulse 60, respiratory rate of 16, afebrile.  HEENT: Normocephalic, atraumatic. Pupils equal and reactive to light.  NECK: Supple. No significant JVD, bruits, or adenopathy.  LUNGS: Clear to auscultation and percussion. No significant wheeze, rhonchi, or rale.  HEART: Slightly bradycardic. Systolic ejection murmur left sternal border.  ABDOMEN: Benign.  NEUROLOGIC: Intact.  SKIN: Normal.   LABORATORY DATA: BMP normal except for sodium of 138,  31,  creatinine of 2.17. Troponin 0.03. CBC was normal. Coagulations normal.   EKG: Normal sinus rhythm. Followup EKG shows a rate of 80, nonspecific ST-T wave changes.   Chest x-ray is negative.   ASSESSMENT: Chest pain, bradycardia, renal insufficiency, diarrhea, hypertension, obesity.   PLAN:  1.  Recommend evaluation for chest pain. Consider functional study. Do not recommend cardiac catheterization. Hold off on beta blockade therapy and discontinue metoprolol. Continue current therapy. No clear indication for pacemaker as long as beta blocker is continued.  2.  Acute on chronic renal substances insufficiency. Have the patient follow with nephrology for further evaluation and management. Avoid ACE inhibitors. Follow up creatinine. Recommend continued hydration. Recommend ultrasound of the kidneys to rule out obstruction as well  as urinary tract infection.  3.  Diarrhea. Rule out Clostridium difficile. Recommend Imodium, which may be contributing to his dehydration.  4.  Hypertension Continue fluid hydration. Change metoprolol to avoid  ACE inhibitors, maybe consider amlodipine or hydralazine. Treat hypotension. Will recommend continued medical therapy.  5.  Continue treatment for gastroesophageal reflux disease. Have the patient follow up if he has further problems. We will treat the patient medically for now.    ____________________________ Loran Senters. Clayborn Bigness, MD ddc:bm D: 03/06/2015 18:28:53 ET T: 03/06/2015 22:49:47 ET JOB#: [id number]  cc: Dwayne D. Clayborn Bigness, MD, <Dictator> Yolonda Kida MD ELECTRONICALLY SIGNED 03/08/2015 13:32

## 2015-05-31 ENCOUNTER — Other Ambulatory Visit: Payer: Self-pay | Admitting: *Deleted

## 2015-05-31 MED ORDER — TAMSULOSIN HCL 0.4 MG PO CAPS: 0.4000 mg | ORAL_CAPSULE | Freq: Every day | ORAL | Status: DC

## 2015-05-31 NOTE — Telephone Encounter (Signed)
Refill request for Tamsulosin 0.4 mg Last filled by MD on- 09/02/2014 #90 x3 Last Appt: 03/10/2015 Next Appt: none Please advise refill? [facility]

## 2015-06-03 ENCOUNTER — Other Ambulatory Visit: Payer: Self-pay | Admitting: Radiation Oncology

## 2015-06-16 ENCOUNTER — Telehealth: Payer: Self-pay | Admitting: Emergency Medicine

## 2015-06-16 MED ORDER — MONTELUKAST SODIUM 10 MG PO TABS: 10.0000 mg | ORAL_TABLET | Freq: Every day | ORAL | Status: DC

## 2015-06-16 MED ORDER — TIOTROPIUM BROMIDE MONOHYDRATE 18 MCG IN CAPS: 1.0000 | ORAL_CAPSULE | Freq: Every day | RESPIRATORY_TRACT | Status: DC

## 2015-06-16 NOTE — Telephone Encounter (Signed)
Pt needs refills on these medications. Thanks.

## 2015-06-18 ENCOUNTER — Other Ambulatory Visit: Payer: Self-pay | Admitting: Family Medicine

## 2015-06-18 DIAGNOSIS — R0602 Shortness of breath: Secondary | ICD-10-CM

## 2015-06-18 NOTE — Telephone Encounter (Signed)
Pharmacy request refill.  Pharmacy: [facility]

## 2015-06-28 ENCOUNTER — Other Ambulatory Visit: Payer: Self-pay | Admitting: *Deleted

## 2015-06-28 MED ORDER — LORAZEPAM 1 MG PO TABS: 1.0000 mg | ORAL_TABLET | Freq: Every evening | ORAL | Status: DC | PRN

## 2015-06-28 NOTE — Telephone Encounter (Signed)
Please call in the following medication.   LORazepam (ATIVAN) 1 MG tablet     Sig: Take by mouth at bedtime as needed    Dispense: 30 tablet   Start RF: 3

## 2015-06-28 NOTE — Telephone Encounter (Signed)
Rx phoned into [facility].

## 2015-06-28 NOTE — Telephone Encounter (Signed)
Refill request for Lorazepam 1 mg Last filled by MD on- 12/21/2014 #30 x5 Last Appt: 03/10/2015 Next Appt: none Please advise refill?

## 2015-07-05 ENCOUNTER — Other Ambulatory Visit: Payer: Self-pay | Admitting: *Deleted

## 2015-07-05 MED ORDER — ATORVASTATIN CALCIUM 80 MG PO TABS: 80.0000 mg | ORAL_TABLET | Freq: Every day | ORAL | Status: DC

## 2015-07-05 NOTE — Telephone Encounter (Signed)
Refill request for atorvastatin 80 mg Last filled by MD on- 03/30/2014 #90 x3 Last Appt: 03/11/2015 Next Appt: none Please advise refill?

## 2015-07-27 ENCOUNTER — Encounter: Payer: Self-pay | Admitting: Family Medicine

## 2015-07-27 ENCOUNTER — Ambulatory Visit
Admission: RE | Admit: 2015-07-27 | Discharge: 2015-07-27 | Disposition: A | Discharge status: Home / Self Care | Payer: Medicare Other | Source: Ambulatory Visit | Attending: Family Medicine | Admitting: Family Medicine

## 2015-07-27 ENCOUNTER — Ambulatory Visit (INDEPENDENT_AMBULATORY_CARE_PROVIDER_SITE_OTHER): Payer: Medicare Other | Admitting: Family Medicine

## 2015-07-27 VITALS — BP 110/60 | HR 94 | Temp 98.3°F | Resp 20 | Wt 197.0 lb

## 2015-07-27 DIAGNOSIS — C61 Malignant neoplasm of prostate: Secondary | ICD-10-CM | POA: Insufficient documentation

## 2015-07-27 DIAGNOSIS — M5136 Other intervertebral disc degeneration, lumbar region: Secondary | ICD-10-CM | POA: Insufficient documentation

## 2015-07-27 DIAGNOSIS — G4700 Insomnia, unspecified: Secondary | ICD-10-CM | POA: Insufficient documentation

## 2015-07-27 DIAGNOSIS — M4310 Spondylolisthesis, site unspecified: Secondary | ICD-10-CM

## 2015-07-27 DIAGNOSIS — M1990 Unspecified osteoarthritis, unspecified site: Secondary | ICD-10-CM | POA: Insufficient documentation

## 2015-07-27 DIAGNOSIS — M5442 Lumbago with sciatica, left side: Secondary | ICD-10-CM

## 2015-07-27 DIAGNOSIS — R0602 Shortness of breath: Secondary | ICD-10-CM | POA: Insufficient documentation

## 2015-07-27 DIAGNOSIS — G459 Transient cerebral ischemic attack, unspecified: Secondary | ICD-10-CM | POA: Insufficient documentation

## 2015-07-27 DIAGNOSIS — M109 Gout, unspecified: Secondary | ICD-10-CM | POA: Insufficient documentation

## 2015-07-27 DIAGNOSIS — N400 Enlarged prostate without lower urinary tract symptoms: Secondary | ICD-10-CM | POA: Insufficient documentation

## 2015-07-27 DIAGNOSIS — R739 Hyperglycemia, unspecified: Secondary | ICD-10-CM | POA: Insufficient documentation

## 2015-07-27 DIAGNOSIS — R109 Unspecified abdominal pain: Secondary | ICD-10-CM | POA: Insufficient documentation

## 2015-07-27 DIAGNOSIS — M4319 Spondylolisthesis, multiple sites in spine: Secondary | ICD-10-CM

## 2015-07-27 DIAGNOSIS — K921 Melena: Secondary | ICD-10-CM | POA: Insufficient documentation

## 2015-07-27 DIAGNOSIS — K219 Gastro-esophageal reflux disease without esophagitis: Secondary | ICD-10-CM | POA: Insufficient documentation

## 2015-07-27 DIAGNOSIS — J441 Chronic obstructive pulmonary disease with (acute) exacerbation: Secondary | ICD-10-CM | POA: Insufficient documentation

## 2015-07-27 DIAGNOSIS — I959 Hypotension, unspecified: Secondary | ICD-10-CM | POA: Insufficient documentation

## 2015-07-27 DIAGNOSIS — J449 Chronic obstructive pulmonary disease, unspecified: Secondary | ICD-10-CM | POA: Insufficient documentation

## 2015-07-27 MED ORDER — HYDROCODONE-ACETAMINOPHEN 5-325 MG PO TABS: 1.0000 | ORAL_TABLET | Freq: Four times a day (QID) | ORAL | Status: DC | PRN

## 2015-07-27 NOTE — Progress Notes (Signed)
Patient: James WADHWA Sr. Male    DOB: [birth year]   81 y.o.   MRN: [id number] Visit Date: 07/27/2015  Today's Provider: Lelon Huh, MD   Chief Complaint  Patient presents with  . Back Pain    chronic   Subjective:    Back Pain This is a chronic problem. The current episode started more than 1 year ago. The problem occurs intermittently. The problem has been gradually worsening since onset. The pain is present in the lumbar spine. The quality of the pain is described as aching and shooting. Radiates to: left leg. Stiffness is present in the morning. Associated symptoms include leg pain and tingling (numbness in hands). Pertinent negatives include no abdominal pain, bladder incontinence, bowel incontinence, chest pain, dysuria, fever, numbness, paresis, paresthesias, pelvic pain, perianal numbness, weakness or weight loss. He has tried NSAIDs (Tramadol) for the symptoms. The treatment provided mild relief.   Has been getting chiropractic treatments which don't seem to help much anymore. Has taken occasional tramadol, but it makes him sleepy, but doesn't seem to help much with pain. Occasional pain radiates into his left hip. Pain is worse in the morning. OTC heat patches seem to help. He was found to have spondylolisthesis and was scheduled for back surgery several years but was not cleared for surgery due to cardiac condition.      Allergies  Allergen Reactions  . Indomethacin Nausea Only   Previous Medications   ASPIRIN BUF,CACARB-MGCARB-MGO, (BUFFERED ASPIRIN) 325 MG TABS       ATORVASTATIN (LIPITOR) 80 MG TABLET    Take 1 tablet (80 mg total) by mouth daily.   AZELASTINE (OPTIVAR) 0.05 % OPHTHALMIC SOLUTION    Apply 1 drop to eye 2 (two) times daily.    BENAZEPRIL-HYDROCHLORTHIAZIDE (LOTENSIN HCT) 20-12.5 MG PER TABLET    Take 0.5 tablets by mouth daily.    BETA CAROTENE W/MINERALS (OCUVITE) TABLET    Take 1 tablet by mouth daily.   FLUTICASONE (FLONASE) 50 MCG/ACT NASAL  SPRAY    Place 2 sprays into both nostrils daily.    LANSOPRAZOLE (PREVACID) 30 MG CAPSULE    TAKE ONE CAPSULE EVERY DAY   LORAZEPAM (ATIVAN) 1 MG TABLET    Take 1 tablet (1 mg total) by mouth at bedtime as needed for anxiety.   MAGNESIUM 400 MG CAPS    Take 1 capsule by mouth daily.    METOPROLOL (LOPRESSOR) 50 MG TABLET    1 tablet every morning , then 1/2 tablet in the evening   MONTELUKAST (SINGULAIR) 10 MG TABLET    Take 1 tablet (10 mg total) by mouth daily.   MULTIPLE VITAMIN PO    Take 1 tablet by mouth daily.    NAPROXEN (NAPROSYN) 500 MG TABLET    Take 500 mg by mouth 2 (two) times daily as needed.    OMEGA-3 FATTY ACIDS PO    Take 1,000 mg by mouth daily.    TAMSULOSIN (FLOMAX) 0.4 MG CAPS CAPSULE    Take 1 capsule (0.4 mg total) by mouth daily.   TIOTROPIUM (SPIRIVA HANDIHALER) 18 MCG INHALATION CAPSULE    1 capsule inhaled through device every day   TRAMADOL (ULTRAM) 50 MG TABLET    Take 50 mg by mouth 2 (two) times daily.     Review of Systems  Constitutional: Negative for fever and weight loss.  Cardiovascular: Negative for chest pain.  Gastrointestinal: Negative for abdominal pain and bowel incontinence.  Genitourinary: Negative for  bladder incontinence, dysuria and pelvic pain.  Musculoskeletal: Positive for back pain.  Neurological: Positive for tingling (numbness in hands). Negative for weakness, numbness and paresthesias.    History  Substance Use Topics  . Smoking status: Former Smoker -- 1.00 packs/day for 11 years    Types: Cigarettes    Quit date: 12/18/1978  . Smokeless tobacco: Not on file  . Alcohol Use: 0.0 oz/week    0 Standard drinks or equivalent per week     Comment: Moderate use; 2 beers per week   Objective:   BP 110/60 mmHg  Pulse 94  Temp(Src) 98.3 F (36.8 C) (Oral)  Resp 20  Wt 197 lb (89.359 kg)  SpO2 93%  Physical Exam   General Appearance:    Alert, cooperative, no distress  Eyes:    PERRL, conjunctiva/corneas clear, EOM's intact        Lungs:     Clear to auscultation bilaterally, respirations unlabored  Heart:    Regular rate and rhythm  Neurologic:   Awake, alert, oriented x 3. No apparent focal neurological           defect.   MS:  Tender along left lateral spine, no swelling or gross deformities. FROM and strength of LEs.        Assessment & Plan:     1. Low back pain with radiation, left No longer controlled with tramadol.  - DG Lumbar Spine Complete; Future - HYDROcodone-acetaminophen (NORCO/VICODIN) 5-325 MG per tablet; Take 1 tablet by mouth every 6 (six) hours as needed for moderate pain.  Dispense: 30 tablet; Refill: 0  2. Acquired spondylolisthesis         Lelon Huh, MD  [facility]

## 2015-08-17 ENCOUNTER — Other Ambulatory Visit: Payer: Self-pay | Admitting: *Deleted

## 2015-08-17 MED ORDER — METOPROLOL TARTRATE 50 MG PO TABS: 50.0000 mg | ORAL_TABLET | Freq: Two times a day (BID) | ORAL | Status: DC

## 2015-08-17 NOTE — Telephone Encounter (Signed)
Refill request for Metoprolol Tartrate 50 mg Last filled by MD on- 03/30/2014 #270 x3 Last Appt: 07/27/2015 Next Appt: none Please advise refill?

## 2015-08-19 ENCOUNTER — Other Ambulatory Visit: Payer: Self-pay | Admitting: Family Medicine

## 2015-08-19 ENCOUNTER — Telehealth: Payer: Self-pay | Admitting: Family Medicine

## 2015-08-19 MED ORDER — METOPROLOL TARTRATE 50 MG PO TABS: 50.0000 mg | ORAL_TABLET | Freq: Two times a day (BID) | ORAL | Status: DC

## 2015-08-19 NOTE — Telephone Encounter (Signed)
Pharmacy is needing clarification for how pt takes metoprolol 50 mg. Clarification was given to pharmacy.

## 2015-08-19 NOTE — Telephone Encounter (Signed)
Pt request that we contact his mail order pharmacy b/c they called him and told him they weren't going to fill his metoprolol (LOPRESSOR) 50 MG tablet RX that we sent in today. Pt stated he was told that they had tried to reach our office b/c they have a question and they can not fill the RX until they speak to someone at our office. Pt stated the pharmacy # is [phone]. Please advise. Thanks TNP

## 2015-08-20 ENCOUNTER — Other Ambulatory Visit: Payer: Self-pay | Admitting: Family Medicine

## 2015-08-20 MED ORDER — FLUTICASONE PROPIONATE 50 MCG/ACT NA SUSP: 2.0000 | Freq: Every day | NASAL | Status: DC

## 2015-09-11 ENCOUNTER — Ambulatory Visit: Payer: Medicare Other

## 2015-09-16 ENCOUNTER — Ambulatory Visit (INDEPENDENT_AMBULATORY_CARE_PROVIDER_SITE_OTHER): Payer: Medicare Other

## 2015-09-16 DIAGNOSIS — Z23 Encounter for immunization: Secondary | ICD-10-CM | POA: Diagnosis not present

## 2015-09-21 ENCOUNTER — Other Ambulatory Visit: Payer: Self-pay | Admitting: *Deleted

## 2015-09-21 MED ORDER — LORAZEPAM 1 MG PO TABS: 1.0000 mg | ORAL_TABLET | Freq: Every evening | ORAL | Status: DC | PRN

## 2015-09-21 NOTE — Telephone Encounter (Signed)
Rx called in to pharmacy. 

## 2015-09-21 NOTE — Telephone Encounter (Signed)
Please call in lorazepam.  

## 2015-09-30 ENCOUNTER — Other Ambulatory Visit: Payer: Self-pay | Admitting: Radiation Oncology

## 2015-10-26 ENCOUNTER — Other Ambulatory Visit: Payer: Self-pay | Admitting: *Deleted

## 2015-10-26 MED ORDER — TAMSULOSIN HCL 0.4 MG PO CAPS: 0.4000 mg | ORAL_CAPSULE | Freq: Every day | ORAL | Status: DC

## 2015-11-16 ENCOUNTER — Ambulatory Visit (INDEPENDENT_AMBULATORY_CARE_PROVIDER_SITE_OTHER): Payer: Medicare Other | Admitting: Family Medicine

## 2015-11-16 ENCOUNTER — Encounter: Payer: Self-pay | Admitting: Family Medicine

## 2015-11-16 VITALS — BP 120/68 | HR 73 | Temp 98.6°F | Resp 20 | Wt 203.0 lb

## 2015-11-16 DIAGNOSIS — R0609 Other forms of dyspnea: Secondary | ICD-10-CM | POA: Diagnosis not present

## 2015-11-16 DIAGNOSIS — R0600 Dyspnea, unspecified: Secondary | ICD-10-CM

## 2015-11-16 DIAGNOSIS — J449 Chronic obstructive pulmonary disease, unspecified: Secondary | ICD-10-CM | POA: Diagnosis not present

## 2015-11-16 DIAGNOSIS — I493 Ventricular premature depolarization: Secondary | ICD-10-CM | POA: Diagnosis not present

## 2015-11-16 MED ORDER — BUDESONIDE-FORMOTEROL FUMARATE 80-4.5 MCG/ACT IN AERO: 2.0000 | INHALATION_SPRAY | Freq: Two times a day (BID) | RESPIRATORY_TRACT | Status: DC

## 2015-11-16 NOTE — Progress Notes (Signed)
Patient: James SUSKI Sr. Male    DOB: 1929/02/25   81 y.o.   MRN: [id number] Visit Date: 11/16/2015  Today's Provider: Lelon Huh, MD   Chief Complaint  Patient presents with  . Shortness of Breath   Subjective:    HPI  Patient has had SOB for about 7 years. Patient has had sob upon exertion for the last 3 months. Patient has trouble doing strenuous thing without getting out of breath. He presented with similar symptoms in October of last year and found to have moderate COPD on spirometry. He was started on Symbicort and had significant improvement in symptoms and  Spirometry at follow up in December. He took Symbicort for a few months, but did not request additional refills since he thought he didn't need it anymore. He was doing well until about 3 months ago. He is followed by Dr. Ubaldo Glassing for CAD and states he had good report when he had cath done this spring.   Allergies  Allergen Reactions  . Indomethacin Nausea Only   Previous Medications   ASPIRIN BUF,CACARB-MGCARB-MGO, (BUFFERED ASPIRIN) 325 MG TABS       ATORVASTATIN (LIPITOR) 80 MG TABLET    Take 1 tablet (80 mg total) by mouth daily.   AZELASTINE (OPTIVAR) 0.05 % OPHTHALMIC SOLUTION    Apply 1 drop to eye 2 (two) times daily.    BENAZEPRIL-HYDROCHLORTHIAZIDE (LOTENSIN HCT) 20-12.5 MG PER TABLET    Take 0.5 tablets by mouth daily.    BETA CAROTENE W/MINERALS (OCUVITE) TABLET    Take 1 tablet by mouth daily.   FLUTICASONE (FLONASE) 50 MCG/ACT NASAL SPRAY    Place 2 sprays into both nostrils daily.   HYDROCODONE-ACETAMINOPHEN (NORCO/VICODIN) 5-325 MG PER TABLET    Take 1 tablet by mouth every 6 (six) hours as needed for moderate pain.   LANSOPRAZOLE (PREVACID) 30 MG CAPSULE    TAKE ONE CAPSULE EVERY DAY   LORAZEPAM (ATIVAN) 1 MG TABLET    Take 1 tablet (1 mg total) by mouth at bedtime as needed for anxiety.   MAGNESIUM 400 MG CAPS    Take 1 capsule by mouth daily.    METOPROLOL (LOPRESSOR) 50 MG TABLET    Take 1 tablet  (50 mg total) by mouth 2 (two) times daily.   MONTELUKAST (SINGULAIR) 10 MG TABLET    Take 1 tablet (10 mg total) by mouth daily.   MULTIPLE VITAMIN PO    Take 1 tablet by mouth daily.    NAPROXEN (NAPROSYN) 500 MG TABLET    Take 500 mg by mouth 2 (two) times daily as needed.    OMEGA-3 FATTY ACIDS PO    Take 1,000 mg by mouth daily.    TAMSULOSIN (FLOMAX) 0.4 MG CAPS CAPSULE    Take 1 capsule (0.4 mg total) by mouth daily.   TIOTROPIUM (SPIRIVA HANDIHALER) 18 MCG INHALATION CAPSULE    1 capsule inhaled through device every day   TRAMADOL (ULTRAM) 50 MG TABLET    Take 50 mg by mouth 2 (two) times daily.     Review of Systems  Respiratory: Positive for cough, shortness of breath and wheezing. Negative for chest tightness.   Cardiovascular: Positive for leg swelling. Negative for chest pain and palpitations.  Neurological: Positive for numbness. Negative for dizziness and light-headedness.       Hands get numb    Social History  Substance Use Topics  . Smoking status: Former Smoker -- 1.00 packs/day for 11 years  Types: Cigarettes    Quit date: 12/18/1978  . Smokeless tobacco: Not on file  . Alcohol Use: 0.0 oz/week    0 Standard drinks or equivalent per week     Comment: Moderate use; 2 beers per week   Objective:   BP 120/68 mmHg  Pulse 73  Temp(Src) 98.6 F (37 C) (Oral)  Resp 20  Wt 203 lb (92.08 kg)  SpO2 95%  Physical Exam   General Appearance:    Alert, cooperative, no distress  Eyes:    PERRL, conjunctiva/corneas clear, EOM's intact       Lungs:     Clear to auscultation bilaterally, respirations unlabored, diminished breath sounds.   Heart:    Iregular rhythm, normal rate  Neurologic:   Awake, alert, oriented x 3. No apparent focal neurological           defect.           Assessment & Plan:     1. Dyspnea on exertion  - EKG 12-Lead  2. PVC (premature ventricular contraction) On betablocker per cardiology.   3. Chronic obstructive pulmonary disease,  unspecified COPD type ([place]) Responded well to Symbicort for same symptoms last year. Will restart. Given sample for 15 day trial and prescription to fill if effective. He is to call if not greatly improved after finishing sample. Continue Spiriva.  - budesonide-formoterol (SYMBICORT) 80-4.5 MCG/ACT inhaler; Inhale 2 puffs into the lungs 2 (two) times daily.  Dispense: 1 Inhaler; Refill: 12       Lelon Huh, MD  [facility]

## 2015-11-17 ENCOUNTER — Other Ambulatory Visit: Payer: Self-pay | Admitting: Family Medicine

## 2015-11-17 ENCOUNTER — Other Ambulatory Visit: Payer: Self-pay | Admitting: *Deleted

## 2015-11-17 MED ORDER — MONTELUKAST SODIUM 10 MG PO TABS: 10.0000 mg | ORAL_TABLET | Freq: Every day | ORAL | Status: DC

## 2015-11-17 MED ORDER — FLUTICASONE PROPIONATE 50 MCG/ACT NA SUSP: 2.0000 | Freq: Every day | NASAL | Status: DC

## 2015-11-17 NOTE — Telephone Encounter (Signed)
Received fax from [facility] requesting 90 supply for montelukast 10 mg.

## 2015-11-29 ENCOUNTER — Other Ambulatory Visit: Payer: Self-pay | Admitting: *Deleted

## 2015-11-29 ENCOUNTER — Ambulatory Visit (INDEPENDENT_AMBULATORY_CARE_PROVIDER_SITE_OTHER): Payer: Medicare Other | Admitting: Family Medicine

## 2015-11-29 ENCOUNTER — Encounter: Payer: Self-pay | Admitting: Family Medicine

## 2015-11-29 VITALS — BP 124/62 | HR 65 | Temp 98.8°F | Resp 16 | Wt 204.8 lb

## 2015-11-29 DIAGNOSIS — J069 Acute upper respiratory infection, unspecified: Secondary | ICD-10-CM

## 2015-11-29 MED ORDER — DOXYCYCLINE HYCLATE 100 MG PO TABS: 100.0000 mg | ORAL_TABLET | Freq: Two times a day (BID) | ORAL | Status: DC

## 2015-11-29 MED ORDER — HYDROCOD POLST-CPM POLST ER 10-8 MG/5ML PO SUER: 5.0000 mL | Freq: Two times a day (BID) | ORAL | Status: DC | PRN

## 2015-11-29 NOTE — Telephone Encounter (Signed)
Patient sent an e-mail request for tussonex rx. Called pt back for more information. Patient has had cough for 2 days. Coughing often, non-productive. No sob, wheezing, fever, or sore throat. Patient had some tussionex that he has taken from a previous cough. Patient stated that it seems to help with cough and is requesting another rx for tussionex. Please advise?

## 2015-11-29 NOTE — Progress Notes (Signed)
Subjective:     Patient ID: James Croissant Sr., male   DOB: September 27, 1929, 81 y.o.   MRN: [id number]  HPI  Chief Complaint  Patient presents with  . Cough    Patient comes in office today with concerns of cough and congestion for 1 week. Patient states that he has had chest congestion and shortness of breath, his cough is describes as "croup" like. Patient reports taking his Symbicort inhaler for relief as well with otc Mucinex  Cough is minimally productive. Reports compliance with both Spiriva and Symbicort. Also has started Mucinex with minimal production of "pale yellow" sputum. Accompanied by his wife today.   Review of Systems  Constitutional: Negative for fever and chills.       Objective:   Physical Exam  Constitutional: He appears well-developed and well-nourished. No distress.  Ears: T.M's intact without inflammation Throat: no tonsillar enlargement or exudate Neck: no cervical adenopathy Lungs: clear     Assessment:    1. Upper respiratory infection - chlorpheniramine-HYDROcodone (TUSSIONEX PENNKINETIC ER) 10-8 MG/5ML SUER; Take 5 mLs by mouth every 12 (twelve) hours as needed for cough.  Dispense: 240 mL; Refill: 0 - doxycycline (VIBRA-TABS) 100 MG tablet; Take 1 tablet (100 mg total) by mouth 2 (two) times daily.  Dispense: 20 tablet; Refill: 0    Plan:   Start antibiotic if cough and shortness of breath are not improving over the next 24-48 hours.

## 2015-11-29 NOTE — Patient Instructions (Signed)
Continue inhalers and start cough syrup. If cough and shortness of breath not improving over the next few days to start antibiotic.

## 2015-11-29 NOTE — Telephone Encounter (Signed)
Patient is requesting rx for Tussionex Penninetic susp mpi

## 2015-11-29 NOTE — Telephone Encounter (Signed)
Tussionex is a controlled drug and cannot be called into pharmacy. He can take OTC Delsym or Mucinex for cough. He needs office visit if he has any fever, chills, shortness of breath or chest pain. Or if cough is not improving within a week.

## 2015-11-30 NOTE — Telephone Encounter (Signed)
Patient was seen by Mikki Santee yesterday for cough. Patient stated that is feeling a little better today.

## 2015-12-03 ENCOUNTER — Ambulatory Visit (INDEPENDENT_AMBULATORY_CARE_PROVIDER_SITE_OTHER): Payer: Medicare Other | Admitting: Family Medicine

## 2015-12-03 ENCOUNTER — Encounter: Payer: Self-pay | Admitting: Family Medicine

## 2015-12-03 ENCOUNTER — Ambulatory Visit
Admission: RE | Admit: 2015-12-03 | Discharge: 2015-12-03 | Disposition: A | Discharge status: Home / Self Care | Payer: Medicare Other | Source: Ambulatory Visit | Attending: Family Medicine | Admitting: Family Medicine

## 2015-12-03 ENCOUNTER — Telehealth: Payer: Self-pay

## 2015-12-03 VITALS — BP 118/58 | HR 54 | Temp 98.1°F | Resp 16 | Wt 200.0 lb

## 2015-12-03 DIAGNOSIS — J189 Pneumonia, unspecified organism: Secondary | ICD-10-CM | POA: Diagnosis not present

## 2015-12-03 DIAGNOSIS — R059 Cough, unspecified: Secondary | ICD-10-CM

## 2015-12-03 DIAGNOSIS — R05 Cough: Secondary | ICD-10-CM

## 2015-12-03 DIAGNOSIS — Z87891 Personal history of nicotine dependence: Secondary | ICD-10-CM | POA: Insufficient documentation

## 2015-12-03 DIAGNOSIS — Z951 Presence of aortocoronary bypass graft: Secondary | ICD-10-CM | POA: Diagnosis not present

## 2015-12-03 DIAGNOSIS — R918 Other nonspecific abnormal finding of lung field: Secondary | ICD-10-CM | POA: Insufficient documentation

## 2015-12-03 MED ORDER — LEVOFLOXACIN 500 MG PO TABS: 500.0000 mg | ORAL_TABLET | Freq: Every day | ORAL | Status: DC

## 2015-12-03 MED ORDER — PREDNISONE 10 MG (21) PO TBPK: 10.0000 mg | ORAL_TABLET | Freq: Every day | ORAL | Status: DC

## 2015-12-03 NOTE — Progress Notes (Signed)
Patient: James HABERLE Sr. Male    DOB: 1929-10-11   81 y.o.   MRN: [id number] Visit Date: 12/03/2015  Today's Provider: Lelon Huh, MD   Chief Complaint  Patient presents with  . URI   Subjective:    URI  The problem has been unchanged. Associated symptoms include congestion, coughing (productive with yellow phlegm), joint swelling (in feet and ankles), a sore throat and wheezing. Pertinent negatives include no abdominal pain, chest pain, diarrhea, dysuria, ear pain, headaches, joint pain, nausea, neck pain, plugged ear sensation, rash, rhinorrhea, sinus pain, sneezing, swollen glands or vomiting. Treatments tried: Tussionex cough syrup and Doxycline. The treatment provided no relief.  Patient was seen 11/29/2015 for URI. Patient was seen by Carmon Ginsberg PA-C and treated with Tussionex cough syrup. Patient was advised to start taking antibiotic is symptoms did not improve, which he just started yesterday. He continues on his chronic inhalers, which he states helps with his breathing quite a bit, but has not cough continues. Has been taking Tussionex prescribed by Mikki Santee but it hasn't been helping.      Allergies  Allergen Reactions  . Indomethacin Nausea Only   Previous Medications   ASPIRIN BUF,CACARB-MGCARB-MGO, (BUFFERED ASPIRIN) 325 MG TABS       ASPIRIN EC 325 MG TABLET    Take by mouth.   ATORVASTATIN (LIPITOR) 80 MG TABLET    Take 1 tablet (80 mg total) by mouth daily.   AZELASTINE (OPTIVAR) 0.05 % OPHTHALMIC SOLUTION    Apply 1 drop to eye 2 (two) times daily.    BENAZEPRIL-HYDROCHLORTHIAZIDE (LOTENSIN HCT) 20-12.5 MG PER TABLET    Take 0.5 tablets by mouth daily.    BETA CAROTENE W/MINERALS (OCUVITE) TABLET    Take 1 tablet by mouth daily.   BUDESONIDE-FORMOTEROL (SYMBICORT) 80-4.5 MCG/ACT INHALER    Inhale 2 puffs into the lungs 2 (two) times daily.   CHLORPHENIRAMINE-HYDROCODONE (TUSSIONEX PENNKINETIC ER) 10-8 MG/5ML SUER    Take 5 mLs by mouth every 12 (twelve)  hours as needed for cough.   DOXYCYCLINE (VIBRA-TABS) 100 MG TABLET    Take 1 tablet (100 mg total) by mouth 2 (two) times daily.   FLUTICASONE (FLONASE) 50 MCG/ACT NASAL SPRAY    Place 2 sprays into both nostrils daily.   HYDROCODONE-ACETAMINOPHEN (NORCO/VICODIN) 5-325 MG PER TABLET    Take 1 tablet by mouth every 6 (six) hours as needed for moderate pain.   LANSOPRAZOLE (PREVACID) 30 MG CAPSULE    TAKE ONE CAPSULE EVERY DAY   LORAZEPAM (ATIVAN) 1 MG TABLET    Take 1 tablet (1 mg total) by mouth at bedtime as needed for anxiety.   MAGNESIUM 400 MG CAPS    Take 1 capsule by mouth daily.    METOPROLOL (LOPRESSOR) 50 MG TABLET    Take 1 tablet (50 mg total) by mouth 2 (two) times daily.   MONTELUKAST (SINGULAIR) 10 MG TABLET    Take 1 tablet (10 mg total) by mouth daily.   MULTIPLE VITAMIN PO    Take 1 tablet by mouth daily.    NAPROXEN (NAPROSYN) 500 MG TABLET    Take 500 mg by mouth 2 (two) times daily as needed.    OMEGA-3 FATTY ACIDS PO    Take 1,000 mg by mouth daily.    TAMSULOSIN (FLOMAX) 0.4 MG CAPS CAPSULE    Take 1 capsule (0.4 mg total) by mouth daily.   TIOTROPIUM (SPIRIVA HANDIHALER) 18 MCG INHALATION CAPSULE  1 capsule inhaled through device every day   TRAMADOL (ULTRAM) 50 MG TABLET    Take 50 mg by mouth 2 (two) times daily.     Review of Systems  Constitutional: Positive for fatigue. Negative for fever, chills, diaphoresis and appetite change.  HENT: Positive for congestion, sore throat and voice change (hoarsness). Negative for ear pain, rhinorrhea and sneezing.   Eyes: Negative for photophobia, pain, discharge, redness, itching and visual disturbance.  Respiratory: Positive for cough (productive with yellow phlegm), shortness of breath and wheezing. Negative for chest tightness.   Cardiovascular: Negative for chest pain and palpitations.  Gastrointestinal: Negative for nausea, vomiting, abdominal pain and diarrhea.  Genitourinary: Negative for dysuria.  Musculoskeletal:  Negative for joint pain and neck pain.  Skin: Negative for rash.  Neurological: Negative for headaches.    Social History  Substance Use Topics  . Smoking status: Former Smoker -- 1.00 packs/day for 11 years    Types: Cigarettes    Quit date: 12/18/1978  . Smokeless tobacco: Not on file  . Alcohol Use: 0.0 oz/week    0 Standard drinks or equivalent per week     Comment: Moderate use; 2 beers per week   Objective:   BP 118/58 mmHg  Pulse 54  Temp(Src) 98.1 F (36.7 C) (Oral)  Resp 16  Wt 200 lb (90.719 kg)  SpO2 95%  Physical Exam  General Appearance:    Alert, cooperative, no distress  HENT:   bilateral TM normal without fluid or infection, neck without nodes and throat normal without erythema or exudate  Eyes:    PERRL, conjunctiva/corneas clear, EOM's intact       Lungs:     Clear to auscultation bilaterally, respirations unlabored  Heart:    Regular rate and rhythm  Neurologic:   Awake, alert, oriented x 3. No apparent focal neurological           defect.      Dg Chest 2 View  12/03/2015   IMPRESSION: Chronic elevation of the right hemidiaphragm. New subsegmental atelectasis or early interstitial pneumonia in the right middle lobe. Stable cardiomegaly and post CABG changes without CHF. Followup PA and lateral chest X-ray is recommended in 3-4 weeks following trial of antibiotic therapy to ensure resolution and exclude underlying malignancy. Electronically Signed   By: David  Martinique M.D.   On: 12/03/2015 16:29        Assessment & Plan:     1. Pneumonia Start Levaquin '500mg'$  daily and 6 day prednisone tape. Call if symptoms change or if not rapidly improving.    - DG Chest 2 View; Future       Lelon Huh, MD  [facility]

## 2015-12-03 NOTE — Telephone Encounter (Signed)
Advised patient of xray results. Meds called into pharmacy.

## 2015-12-07 ENCOUNTER — Telehealth: Payer: Self-pay | Admitting: Family Medicine

## 2015-12-07 NOTE — Telephone Encounter (Signed)
Please check with patient to see if cough is improving since starting Levaquin and prednisone last week. We can always extend prednisone if cough flares up when finished.

## 2015-12-07 NOTE — Telephone Encounter (Signed)
Patient reports that he is feeling much better. He reports that his breathing has improved. He also states that he has been taking Prednisone '10mg'$  1 tablet daily since Friday. He states that in the past he would have to take 5-6 tablets the first day, 4 the next, and so on. I thought I sent in Prednisone 6 day taper, did I (Electra Paladino) send in this med incorrectly? Please advise. Thanks!

## 2016-01-10 ENCOUNTER — Other Ambulatory Visit: Payer: Self-pay

## 2016-01-10 MED ORDER — BENAZEPRIL-HYDROCHLOROTHIAZIDE 20-12.5 MG PO TABS: 0.5000 | ORAL_TABLET | Freq: Every day | ORAL | Status: DC

## 2016-01-10 NOTE — Telephone Encounter (Signed)
Patient requesting refill through email to mail order pharmacy.

## 2016-01-25 ENCOUNTER — Other Ambulatory Visit: Payer: Self-pay | Admitting: *Deleted

## 2016-01-25 MED ORDER — LORAZEPAM 1 MG PO TABS: 1.0000 mg | ORAL_TABLET | Freq: Every evening | ORAL | Status: DC | PRN

## 2016-01-25 NOTE — Telephone Encounter (Signed)
Called in medication below into pharmacy.

## 2016-01-25 NOTE — Telephone Encounter (Signed)
Please call in alprazolam.  

## 2016-02-08 ENCOUNTER — Other Ambulatory Visit: Payer: Self-pay | Admitting: *Deleted

## 2016-02-08 DIAGNOSIS — C61 Malignant neoplasm of prostate: Secondary | ICD-10-CM

## 2016-02-09 ENCOUNTER — Ambulatory Visit: Payer: Medicare Other | Attending: Radiation Oncology | Admitting: Radiation Oncology

## 2016-02-09 ENCOUNTER — Inpatient Hospital Stay: Payer: Medicare Other | Attending: Radiation Oncology

## 2016-02-10 DIAGNOSIS — J42 Unspecified chronic bronchitis: Secondary | ICD-10-CM | POA: Diagnosis not present

## 2016-02-10 DIAGNOSIS — E782 Mixed hyperlipidemia: Secondary | ICD-10-CM | POA: Diagnosis not present

## 2016-02-10 DIAGNOSIS — I2510 Atherosclerotic heart disease of native coronary artery without angina pectoris: Secondary | ICD-10-CM | POA: Diagnosis not present

## 2016-02-10 DIAGNOSIS — I4891 Unspecified atrial fibrillation: Secondary | ICD-10-CM | POA: Diagnosis not present

## 2016-02-10 DIAGNOSIS — I10 Essential (primary) hypertension: Secondary | ICD-10-CM | POA: Diagnosis not present

## 2016-02-14 DIAGNOSIS — M9902 Segmental and somatic dysfunction of thoracic region: Secondary | ICD-10-CM | POA: Diagnosis not present

## 2016-02-14 DIAGNOSIS — M62830 Muscle spasm of back: Secondary | ICD-10-CM | POA: Diagnosis not present

## 2016-02-14 DIAGNOSIS — M9901 Segmental and somatic dysfunction of cervical region: Secondary | ICD-10-CM | POA: Diagnosis not present

## 2016-02-14 DIAGNOSIS — M9903 Segmental and somatic dysfunction of lumbar region: Secondary | ICD-10-CM | POA: Diagnosis not present

## 2016-02-14 DIAGNOSIS — M5412 Radiculopathy, cervical region: Secondary | ICD-10-CM | POA: Diagnosis not present

## 2016-02-14 DIAGNOSIS — M5136 Other intervertebral disc degeneration, lumbar region: Secondary | ICD-10-CM | POA: Diagnosis not present

## 2016-02-21 DIAGNOSIS — M9903 Segmental and somatic dysfunction of lumbar region: Secondary | ICD-10-CM | POA: Diagnosis not present

## 2016-02-21 DIAGNOSIS — M62830 Muscle spasm of back: Secondary | ICD-10-CM | POA: Diagnosis not present

## 2016-02-21 DIAGNOSIS — M9901 Segmental and somatic dysfunction of cervical region: Secondary | ICD-10-CM | POA: Diagnosis not present

## 2016-02-21 DIAGNOSIS — M9902 Segmental and somatic dysfunction of thoracic region: Secondary | ICD-10-CM | POA: Diagnosis not present

## 2016-02-21 DIAGNOSIS — M5412 Radiculopathy, cervical region: Secondary | ICD-10-CM | POA: Diagnosis not present

## 2016-02-21 DIAGNOSIS — M5136 Other intervertebral disc degeneration, lumbar region: Secondary | ICD-10-CM | POA: Diagnosis not present

## 2016-03-03 DIAGNOSIS — H3530 Unspecified macular degeneration: Secondary | ICD-10-CM | POA: Diagnosis not present

## 2016-03-03 DIAGNOSIS — H524 Presbyopia: Secondary | ICD-10-CM | POA: Diagnosis not present

## 2016-03-13 DIAGNOSIS — M9903 Segmental and somatic dysfunction of lumbar region: Secondary | ICD-10-CM | POA: Diagnosis not present

## 2016-03-13 DIAGNOSIS — M9901 Segmental and somatic dysfunction of cervical region: Secondary | ICD-10-CM | POA: Diagnosis not present

## 2016-03-13 DIAGNOSIS — M62830 Muscle spasm of back: Secondary | ICD-10-CM | POA: Diagnosis not present

## 2016-03-13 DIAGNOSIS — M5412 Radiculopathy, cervical region: Secondary | ICD-10-CM | POA: Diagnosis not present

## 2016-03-13 DIAGNOSIS — M9902 Segmental and somatic dysfunction of thoracic region: Secondary | ICD-10-CM | POA: Diagnosis not present

## 2016-03-13 DIAGNOSIS — M5136 Other intervertebral disc degeneration, lumbar region: Secondary | ICD-10-CM | POA: Diagnosis not present

## 2016-03-21 DIAGNOSIS — M4802 Spinal stenosis, cervical region: Secondary | ICD-10-CM | POA: Diagnosis not present

## 2016-03-21 DIAGNOSIS — M5136 Other intervertebral disc degeneration, lumbar region: Secondary | ICD-10-CM | POA: Diagnosis not present

## 2016-03-21 DIAGNOSIS — M5416 Radiculopathy, lumbar region: Secondary | ICD-10-CM | POA: Diagnosis not present

## 2016-03-21 DIAGNOSIS — M5412 Radiculopathy, cervical region: Secondary | ICD-10-CM | POA: Diagnosis not present

## 2016-03-21 DIAGNOSIS — M5030 Other cervical disc degeneration, unspecified cervical region: Secondary | ICD-10-CM | POA: Diagnosis not present

## 2016-03-27 DIAGNOSIS — M5412 Radiculopathy, cervical region: Secondary | ICD-10-CM | POA: Diagnosis not present

## 2016-03-27 DIAGNOSIS — M9903 Segmental and somatic dysfunction of lumbar region: Secondary | ICD-10-CM | POA: Diagnosis not present

## 2016-03-27 DIAGNOSIS — M9902 Segmental and somatic dysfunction of thoracic region: Secondary | ICD-10-CM | POA: Diagnosis not present

## 2016-03-27 DIAGNOSIS — M9901 Segmental and somatic dysfunction of cervical region: Secondary | ICD-10-CM | POA: Diagnosis not present

## 2016-03-27 DIAGNOSIS — M5136 Other intervertebral disc degeneration, lumbar region: Secondary | ICD-10-CM | POA: Diagnosis not present

## 2016-03-27 DIAGNOSIS — M62830 Muscle spasm of back: Secondary | ICD-10-CM | POA: Diagnosis not present

## 2016-04-04 ENCOUNTER — Ambulatory Visit (INDEPENDENT_AMBULATORY_CARE_PROVIDER_SITE_OTHER): Payer: Medicare Other | Admitting: Family Medicine

## 2016-04-04 ENCOUNTER — Encounter: Payer: Self-pay | Admitting: Family Medicine

## 2016-04-04 ENCOUNTER — Other Ambulatory Visit: Payer: Self-pay

## 2016-04-04 VITALS — BP 110/76 | HR 88 | Temp 98.5°F | Resp 16 | Wt 206.2 lb

## 2016-04-04 DIAGNOSIS — J069 Acute upper respiratory infection, unspecified: Secondary | ICD-10-CM | POA: Diagnosis not present

## 2016-04-04 NOTE — Patient Instructions (Signed)
Upper Respiratory Infection, Adult Most upper respiratory infections (URIs) are a viral infection of the air passages leading to the lungs. A URI affects the nose, throat, and upper air passages. The most common type of URI is nasopharyngitis and is typically referred to as "the common cold." URIs run their course and usually go away on their own. Most of the time, a URI does not require medical attention, but sometimes a bacterial infection in the upper airways can follow a viral infection. This is called a secondary infection. Sinus and middle ear infections are common types of secondary upper respiratory infections. Bacterial pneumonia can also complicate a URI. A URI can worsen asthma and chronic obstructive pulmonary disease (COPD). Sometimes, these complications can require emergency medical care and may be life threatening.  CAUSES Almost all URIs are caused by viruses. A virus is a type of germ and can spread from one person to another.  RISKS FACTORS You may be at risk for a URI if:   You smoke.   You have chronic heart or lung disease.  You have a weakened defense (immune) system.   You are very young or very old.   You have nasal allergies or asthma.  You work in crowded or poorly ventilated areas.  You work in health care facilities or schools. SIGNS AND SYMPTOMS  Symptoms typically develop 2-3 days after you come in contact with a cold virus. Most viral URIs last 7-10 days. However, viral URIs from the influenza virus (flu virus) can last 14-18 days and are typically more severe. Symptoms may include:   Runny or stuffy (congested) nose.   Sneezing.   Cough.   Sore throat.   Headache.   Fatigue.   Fever.   Loss of appetite.   Pain in your forehead, behind your eyes, and over your cheekbones (sinus pain).  Muscle aches.  DIAGNOSIS  Your health care provider may diagnose a URI by:  Physical exam.  Tests to check that your symptoms are not due to  another condition such as:  Strep throat.  Sinusitis.  Pneumonia.  Asthma. TREATMENT  A URI goes away on its own with time. It cannot be cured with medicines, but medicines may be prescribed or recommended to relieve symptoms. Medicines may help:  Reduce your fever.  Reduce your cough.  Relieve nasal congestion. HOME CARE INSTRUCTIONS   Take medicines only as directed by your health care provider.   Gargle warm saltwater or take cough drops to comfort your throat as directed by your health care provider.  Use a warm mist humidifier or inhale steam from a shower to increase air moisture. This may make it easier to breathe.  Drink enough fluid to keep your urine clear or pale yellow.   Eat soups and other clear broths and maintain good nutrition.   Rest as needed.   Return to work when your temperature has returned to normal or as your health care provider advises. You may need to stay home longer to avoid infecting others. You can also use a face mask and careful hand washing to prevent spread of the virus.  Increase the usage of your inhaler if you have asthma.   Do not use any tobacco products, including cigarettes, chewing tobacco, or electronic cigarettes. If you need help quitting, ask your health care provider. PREVENTION  The best way to protect yourself from getting a cold is to practice good hygiene.   Avoid oral or hand contact with people with cold   symptoms.   Wash your hands often if contact occurs.  There is no clear evidence that vitamin C, vitamin E, echinacea, or exercise reduces the chance of developing a cold. However, it is always recommended to get plenty of rest, exercise, and practice good nutrition.  SEEK MEDICAL CARE IF:   You are getting worse rather than better.   Your symptoms are not controlled by medicine.   You have chills.  You have worsening shortness of breath.  You have brown or red mucus.  You have yellow or brown nasal  discharge.  You have pain in your face, especially when you bend forward.  You have a fever.  You have swollen neck glands.  You have pain while swallowing.  You have white areas in the back of your throat. SEEK IMMEDIATE MEDICAL CARE IF:   You have severe or persistent:  Headache.  Ear pain.  Sinus pain.  Chest pain.  You have chronic lung disease and any of the following:  Wheezing.  Prolonged cough.  Coughing up blood.  A change in your usual mucus.  You have a stiff neck.  You have changes in your:  Vision.  Hearing.  Thinking.  Mood. MAKE SURE YOU:   Understand these instructions.  Will watch your condition.  Will get help right away if you are not doing well or get worse.   This information is not intended to replace advice given to you by your health care provider. Make sure you discuss any questions you have with your health care provider.   Document Released: 05/30/2001 Document Revised: 04/20/2015 Document Reviewed: 03/11/2014 Elsevier Interactive Patient Education 2016 [employer]  

## 2016-04-04 NOTE — Progress Notes (Signed)
Patient ID: James DASTRUP Sr., male   DOB: July 07, 1929, 82 y.o.   MRN: [id number]   Patient: James LEVINSON Sr. Male    DOB: 1928/12/28   82 y.o.   MRN: [id number] Visit Date: 04/04/2016  Today's Provider: Vernie Murders, PA   Chief Complaint  Patient presents with  . Nasal Congestion   Subjective:    URI  This is a new problem. Episode onset: 2 weeks ago. The problem has been gradually worsening. There has been no fever. Associated symptoms include congestion and coughing (mild). Associated symptoms comments: Postnasal drainage . Treatments tried: OTC sinus medications. The treatment provided no relief.    Past Medical History  Diagnosis Date  . GERD (gastroesophageal reflux disease)   . Prostate cancer ([place])   . Hyperlipidemia   . TIA (transient ischemic attack)   . Hypertension   . CAD (coronary artery disease)   . Hypotension   . Diverticulosis   . Back pain   . Hyperglycemia   . History of chicken pox   . History of measles   . History of mumps    Past Surgical History  Procedure Laterality Date  . Coronary artery bypass graft  2011    with 4 grafts  . Total hip arthroplasty Left 1997    hip joint replacement  . Appendectomy  1950  . Cardiac catheterization  05/29/2013    EF=40-45%. Moderate pulmonary hypertension. Infero/ lateral hypokinesis  . Mri brain  06/07/2013    Mild chronic involutional changes. No acute abnormalities  . Myocardial perfusion scan  05/29/2013    Abnormal myocardial perfusion image consistent with myocardial infarction  . Prostate surgery  11/18/2012     Needle biopsy; Adenocarcinoma of Prostate; Dr. Bernardo Heater   Family History  Problem Relation Age of Onset  . Heart attack Mother   . Stroke Father   . Heart attack Father   . Hypertension Father   . Heart attack Sister   . Bladder Cancer Brother   . Kidney cancer Brother   . Heart Problems Brother    Previous Medications   ASPIRIN EC 325 MG TABLET    Take by mouth.   ATORVASTATIN  (LIPITOR) 80 MG TABLET    Take 1 tablet (80 mg total) by mouth daily.   AZELASTINE (OPTIVAR) 0.05 % OPHTHALMIC SOLUTION    Apply 1 drop to eye 2 (two) times daily.    BENAZEPRIL-HYDROCHLORTHIAZIDE (LOTENSIN HCT) 20-12.5 MG TABLET    Take 0.5 tablets by mouth daily.   BETA CAROTENE W/MINERALS (OCUVITE) TABLET    Take 1 tablet by mouth daily.   BUDESONIDE-FORMOTEROL (SYMBICORT) 80-4.5 MCG/ACT INHALER    Inhale 2 puffs into the lungs 2 (two) times daily.   FLUTICASONE (FLONASE) 50 MCG/ACT NASAL SPRAY    Place 2 sprays into both nostrils daily.   LANSOPRAZOLE (PREVACID) 30 MG CAPSULE    TAKE ONE CAPSULE EVERY DAY   LORAZEPAM (ATIVAN) 1 MG TABLET    Take 1 tablet (1 mg total) by mouth at bedtime as needed for anxiety.   MAGNESIUM 400 MG CAPS    Take 1 capsule by mouth daily.    METOPROLOL (LOPRESSOR) 50 MG TABLET    Take 1 tablet (50 mg total) by mouth 2 (two) times daily.   MONTELUKAST (SINGULAIR) 10 MG TABLET    Take 1 tablet (10 mg total) by mouth daily.   MULTIPLE VITAMIN PO    Take 1 tablet by mouth daily.    NAPROXEN (NAPROSYN) 500  MG TABLET    Take 500 mg by mouth 2 (two) times daily as needed.    OMEGA-3 FATTY ACIDS PO    Take 1,000 mg by mouth daily.    TAMSULOSIN (FLOMAX) 0.4 MG CAPS CAPSULE    Take 1 capsule (0.4 mg total) by mouth daily.   TIOTROPIUM (SPIRIVA HANDIHALER) 18 MCG INHALATION CAPSULE    1 capsule inhaled through device every day   TRAMADOL (ULTRAM) 50 MG TABLET    Take 50 mg by mouth 2 (two) times daily.    Allergies  Allergen Reactions  . Indomethacin Nausea Only    Review of Systems  Constitutional: Negative.   HENT: Positive for congestion.   Eyes: Negative.   Respiratory: Positive for cough (mild).   Cardiovascular: Negative.   Gastrointestinal: Negative.   Endocrine: Negative.   Genitourinary: Negative.   Musculoskeletal: Negative.   Skin: Negative.   Allergic/Immunologic: Negative.   Neurological: Negative.   Hematological: Negative.     Psychiatric/Behavioral: Negative.     Social History  Substance Use Topics  . Smoking status: Former Smoker -- 1.00 packs/day for 11 years    Types: Cigarettes    Quit date: 12/18/1978  . Smokeless tobacco: Not on file  . Alcohol Use: 0.0 oz/week    0 Standard drinks or equivalent per week     Comment: Moderate use; 2 beers per week   Objective:   BP 110/76 mmHg  Pulse 88  Temp(Src) 98.5 F (36.9 C) (Oral)  Resp 16  Wt 206 lb 3.2 oz (93.532 kg)  Wt Readings from Last 3 Encounters:  04/04/16 206 lb 3.2 oz (93.532 kg)  12/03/15 200 lb (90.719 kg)  11/29/15 204 lb 12.8 oz (92.897 kg)   Temp Readings from Last 3 Encounters:  04/04/16 98.5 F (36.9 C) Oral  12/03/15 98.1 F (36.7 C) Oral  11/29/15 98.8 F (37.1 C) Oral   BP Readings from Last 3 Encounters:  04/04/16 110/76  12/03/15 118/58  11/29/15 124/62   Pulse Readings from Last 3 Encounters:  04/04/16 88  12/03/15 54  11/29/15 65   Physical Exam  Constitutional: He is oriented to person, place, and time. He appears well-developed and well-nourished. No distress.  HENT:  Head: Normocephalic and atraumatic.  Right Ear: Hearing and external ear normal.  Left Ear: Hearing and external ear normal.  Nose: Nose normal.  Mouth/Throat: Oropharynx is clear and moist.  Eyes: Conjunctivae and lids are normal. Right eye exhibits no discharge. Left eye exhibits no discharge. No scleral icterus.  Neck: Neck supple.  Cardiovascular: Normal rate and regular rhythm.   Pulmonary/Chest: Effort normal and breath sounds normal. No respiratory distress.  Abdominal: Soft. Bowel sounds are normal.  Musculoskeletal: Normal range of motion.  Neurological: He is alert and oriented to person, place, and time.  Skin: Skin is intact. No lesion and no rash noted.  Psychiatric: He has a normal mood and affect. His speech is normal and behavior is normal. Thought content normal.      Assessment & Plan:     1. Upper respiratory  infection Onset of congestion with post nasal drip over the past 10-14 days. Only slight cough. Finds symptoms are more prevalent at night and better during the day. No fever and feeling better since using Coricidin. May use it prn and increase fluid intake. Continue Fluticasone nasal spray each night and follow up prn.

## 2016-04-24 DIAGNOSIS — M9902 Segmental and somatic dysfunction of thoracic region: Secondary | ICD-10-CM | POA: Diagnosis not present

## 2016-04-24 DIAGNOSIS — M9901 Segmental and somatic dysfunction of cervical region: Secondary | ICD-10-CM | POA: Diagnosis not present

## 2016-04-24 DIAGNOSIS — M9903 Segmental and somatic dysfunction of lumbar region: Secondary | ICD-10-CM | POA: Diagnosis not present

## 2016-04-24 DIAGNOSIS — M5136 Other intervertebral disc degeneration, lumbar region: Secondary | ICD-10-CM | POA: Diagnosis not present

## 2016-04-24 DIAGNOSIS — M5412 Radiculopathy, cervical region: Secondary | ICD-10-CM | POA: Diagnosis not present

## 2016-04-24 DIAGNOSIS — M62830 Muscle spasm of back: Secondary | ICD-10-CM | POA: Diagnosis not present

## 2016-05-04 DIAGNOSIS — M5412 Radiculopathy, cervical region: Secondary | ICD-10-CM | POA: Diagnosis not present

## 2016-05-04 DIAGNOSIS — M4802 Spinal stenosis, cervical region: Secondary | ICD-10-CM | POA: Diagnosis not present

## 2016-05-04 DIAGNOSIS — M5136 Other intervertebral disc degeneration, lumbar region: Secondary | ICD-10-CM | POA: Diagnosis not present

## 2016-05-04 DIAGNOSIS — M5416 Radiculopathy, lumbar region: Secondary | ICD-10-CM | POA: Diagnosis not present

## 2016-05-04 DIAGNOSIS — M5030 Other cervical disc degeneration, unspecified cervical region: Secondary | ICD-10-CM | POA: Diagnosis not present

## 2016-05-22 ENCOUNTER — Encounter: Payer: Self-pay | Admitting: Family Medicine

## 2016-05-22 ENCOUNTER — Ambulatory Visit (INDEPENDENT_AMBULATORY_CARE_PROVIDER_SITE_OTHER): Payer: Medicare Other | Admitting: Family Medicine

## 2016-05-22 ENCOUNTER — Ambulatory Visit
Admission: RE | Admit: 2016-05-22 | Discharge: 2016-05-22 | Disposition: A | Discharge status: Home / Self Care | Payer: Medicare Other | Source: Ambulatory Visit | Attending: Family Medicine | Admitting: Family Medicine

## 2016-05-22 VITALS — BP 100/60 | HR 72 | Temp 98.5°F | Resp 18 | Wt 210.0 lb

## 2016-05-22 DIAGNOSIS — J9811 Atelectasis: Secondary | ICD-10-CM | POA: Insufficient documentation

## 2016-05-22 DIAGNOSIS — R0600 Dyspnea, unspecified: Secondary | ICD-10-CM

## 2016-05-22 DIAGNOSIS — R059 Cough, unspecified: Secondary | ICD-10-CM

## 2016-05-22 DIAGNOSIS — R0609 Other forms of dyspnea: Secondary | ICD-10-CM

## 2016-05-22 DIAGNOSIS — R0602 Shortness of breath: Secondary | ICD-10-CM | POA: Diagnosis not present

## 2016-05-22 DIAGNOSIS — R05 Cough: Secondary | ICD-10-CM | POA: Diagnosis not present

## 2016-05-22 DIAGNOSIS — I517 Cardiomegaly: Secondary | ICD-10-CM | POA: Diagnosis not present

## 2016-05-22 NOTE — Patient Instructions (Signed)
Go to the [facility] on [address] for Chest Xray  

## 2016-05-22 NOTE — Progress Notes (Signed)
Patient: James CUEVAS Sr. Male    DOB: Apr 13, 1929   82 y.o.   MRN: [id number] Visit Date: 05/22/2016  Today's Provider: Lelon Huh, MD   Chief Complaint  Patient presents with  . Cough   Subjective:    Cough This is a chronic problem. The current episode started more than 1 year ago. The problem has been gradually worsening. The cough is productive of sputum. Associated symptoms include nasal congestion and shortness of breath. Pertinent negatives include no chest pain, chills, ear congestion, ear pain, fever, headaches, heartburn, hemoptysis, myalgias, postnasal drip, rash, rhinorrhea, sore throat, sweats or wheezing. The symptoms are aggravated by exercise. He has tried steroid inhaler for the symptoms. The treatment provided mild relief. His past medical history is significant for COPD and pneumonia.    Patient has had a cough for the last few months. Sob upon exertion and productive cough which has worsened lately. Has mild cough productive yellow mucous. Patient is also more swelling in his feet for the last month or so. No chest pains.   Wt Readings from Last 3 Encounters:  05/22/16 210 lb (95.255 kg)  04/04/16 206 lb 3.2 oz (93.532 kg)  12/03/15 200 lb (90.719 kg)     Allergies  Allergen Reactions  . Indomethacin Nausea Only   Previous Medications   ASPIRIN EC 325 MG TABLET    Take by mouth.   ATORVASTATIN (LIPITOR) 80 MG TABLET    Take 1 tablet (80 mg total) by mouth daily.   AZELASTINE (OPTIVAR) 0.05 % OPHTHALMIC SOLUTION    Apply 1 drop to eye 2 (two) times daily.    BENAZEPRIL-HYDROCHLORTHIAZIDE (LOTENSIN HCT) 20-12.5 MG TABLET    Take 0.5 tablets by mouth daily.   BETA CAROTENE W/MINERALS (OCUVITE) TABLET    Take 1 tablet by mouth daily.   BUDESONIDE-FORMOTEROL (SYMBICORT) 80-4.5 MCG/ACT INHALER    Inhale 2 puffs into the lungs 2 (two) times daily.   FLUTICASONE (FLONASE) 50 MCG/ACT NASAL SPRAY    Place 2 sprays into both nostrils daily.   LANSOPRAZOLE  (PREVACID) 30 MG CAPSULE    TAKE ONE CAPSULE EVERY DAY   LORAZEPAM (ATIVAN) 1 MG TABLET    Take 1 tablet (1 mg total) by mouth at bedtime as needed for anxiety.   MAGNESIUM 400 MG CAPS    Take 1 capsule by mouth daily.    METOPROLOL (LOPRESSOR) 50 MG TABLET    Take 1 tablet (50 mg total) by mouth 2 (two) times daily.   MONTELUKAST (SINGULAIR) 10 MG TABLET    Take 1 tablet (10 mg total) by mouth daily.   MULTIPLE VITAMIN PO    Take 1 tablet by mouth daily.    NAPROXEN (NAPROSYN) 500 MG TABLET    Take 500 mg by mouth 2 (two) times daily as needed.    OMEGA-3 FATTY ACIDS PO    Take 1,000 mg by mouth daily.    TAMSULOSIN (FLOMAX) 0.4 MG CAPS CAPSULE    Take 1 capsule (0.4 mg total) by mouth daily.   TIOTROPIUM (SPIRIVA HANDIHALER) 18 MCG INHALATION CAPSULE    1 capsule inhaled through device every day   TRAMADOL (ULTRAM) 50 MG TABLET    Take 50 mg by mouth 2 (two) times daily.     Review of Systems  Constitutional: Negative for fever, chills and appetite change.  HENT: Negative for ear pain, postnasal drip, rhinorrhea and sore throat.   Respiratory: Positive for cough and shortness of  breath. Negative for hemoptysis, chest tightness and wheezing.   Cardiovascular: Positive for leg swelling. Negative for chest pain and palpitations.  Gastrointestinal: Negative for heartburn, nausea, vomiting and abdominal pain.  Musculoskeletal: Negative for myalgias.  Skin: Negative for rash.  Neurological: Negative for dizziness, light-headedness and headaches.    Social History  Substance Use Topics  . Smoking status: Former Smoker -- 1.00 packs/day for 11 years    Types: Cigarettes    Quit date: 12/18/1978  . Smokeless tobacco: Not on file  . Alcohol Use: 0.0 oz/week    0 Standard drinks or equivalent per week     Comment: Moderate use; 2 beers per week   Objective:   BP 100/60 mmHg  Pulse 72  Temp(Src) 98.5 F (36.9 C) (Oral)  Resp 18  Wt 210 lb (95.255 kg)  SpO2 95%  Physical  Exam  General Appearance:    Alert, cooperative, no distress, obese  Eyes:    PERRL, conjunctiva/corneas clear, EOM's intact       Lungs:     Clear to auscultation bilaterally, respirations unlabored  Heart:    Regular rate and rhythm  Neurologic:   Awake, alert, oriented x 3. No apparent focal neurological           defect.   Ext:   2+ bipedal edema       Assessment & Plan:     1. Dyspnea on exertion COPD versus CHF versus LRI - Brain natriuretic peptide - CBC - DG Chest 2 View; Future  2. Cough        Lelon Huh, MD  [facility]

## 2016-05-23 ENCOUNTER — Telehealth: Payer: Self-pay | Admitting: *Deleted

## 2016-05-23 LAB — CBC
Hematocrit: 43.7 % (ref 37.5–51.0)
Hemoglobin: 14.2 g/dL (ref 12.6–17.7)
MCH: 28.7 pg (ref 26.6–33.0)
MCHC: 32.5 g/dL (ref 31.5–35.7)
MCV: 89 fL (ref 79–97)
Platelets: 150 10*3/uL (ref 150–379)
RBC: 4.94 x10E6/uL (ref 4.14–5.80)
RDW: 14.3 % (ref 12.3–15.4)
WBC: 5.6 10*3/uL (ref 3.4–10.8)

## 2016-05-23 LAB — BRAIN NATRIURETIC PEPTIDE: BNP: 111 pg/mL — ABNORMAL HIGH (ref 0.0–100.0)

## 2016-05-23 MED ORDER — FUROSEMIDE 20 MG PO TABS: 20.0000 mg | ORAL_TABLET | Freq: Every day | ORAL | Status: DC

## 2016-05-23 NOTE — Telephone Encounter (Signed)
-----   Message from Birdie Sons, MD sent at 05/23/2016  3:04 PM EDT ----- Odette Horns of lungs and heart are normal. Slight elevation of BNP which could indicate increased work load on heart, which could also contribute to swelling in feet. Need to start furosemide '20mg'$  per day, #30, rf x 0. Follow up o.v. In 2-3 days. Need to get EKG at follow up.

## 2016-05-25 ENCOUNTER — Ambulatory Visit (INDEPENDENT_AMBULATORY_CARE_PROVIDER_SITE_OTHER): Payer: Medicare Other | Admitting: Family Medicine

## 2016-05-25 ENCOUNTER — Encounter: Payer: Self-pay | Admitting: Family Medicine

## 2016-05-25 VITALS — BP 114/60 | HR 71 | Temp 97.8°F | Resp 16 | Wt 208.0 lb

## 2016-05-25 DIAGNOSIS — R0602 Shortness of breath: Secondary | ICD-10-CM | POA: Diagnosis not present

## 2016-05-25 DIAGNOSIS — J449 Chronic obstructive pulmonary disease, unspecified: Secondary | ICD-10-CM

## 2016-05-25 DIAGNOSIS — R7989 Other specified abnormal findings of blood chemistry: Secondary | ICD-10-CM

## 2016-05-25 DIAGNOSIS — R799 Abnormal finding of blood chemistry, unspecified: Secondary | ICD-10-CM | POA: Diagnosis not present

## 2016-05-25 MED ORDER — BUDESONIDE-FORMOTEROL FUMARATE 160-4.5 MCG/ACT IN AERO: 2.0000 | INHALATION_SPRAY | Freq: Two times a day (BID) | RESPIRATORY_TRACT | Status: DC

## 2016-05-25 NOTE — Progress Notes (Signed)
       Patient: James ALLRED Sr. Male    DOB: 17-Oct-1929   82 y.o.   MRN: [id number] Visit Date: 05/25/2016  Today's Provider: Lelon Huh, MD   Chief Complaint  Patient presents with  . Abnormal Lab   Subjective:    HPI Shortness of breath Patient was last seen 05/22/2016. Labs were ordered showing slight elevation of BNP. Had XR with no acute findings. Patient was started on Furosemide '20mg'$  daily which he started yesterday and advised to follow up in the office in 2-3 days. Patient reports good compliance with treatment. Patient states his shortness of breath has worsened since he was last seen, but slightly improved today. Has chronic cough occasionally productive mucous, unchanged from baseline. No chest pains or palpitations.    Wt Readings from Last 3 Encounters:  05/25/16 208 lb (94.348 kg)  05/22/16 210 lb (95.255 kg)  04/04/16 206 lb 3.2 oz (93.532 kg)       Allergies  Allergen Reactions  . Indomethacin Nausea Only   No outpatient prescriptions have been marked as taking for the 05/25/16 encounter (Office Visit) with Birdie Sons, MD.    Review of Systems  Constitutional: Negative for fever, chills and appetite change.  Respiratory: Positive for cough and shortness of breath. Negative for chest tightness and wheezing.   Cardiovascular: Positive for leg swelling (in ankles). Negative for chest pain and palpitations.  Gastrointestinal: Negative for nausea, vomiting and abdominal pain.    Social History  Substance Use Topics  . Smoking status: Former Smoker -- 1.00 packs/day for 11 years    Types: Cigarettes    Quit date: 12/18/1978  . Smokeless tobacco: Not on file  . Alcohol Use: 0.0 oz/week    0 Standard drinks or equivalent per week     Comment: Moderate use; 2 beers per week   Objective:   BP 114/60 mmHg  Pulse 71  Temp(Src) 97.8 F (36.6 C) (Oral)  Resp 16  Wt 208 lb (94.348 kg)  SpO2 95%  Physical Exam  Spirometry-severe restriction EKG:  Sinus  Rhythm  - occasional ectopic ventricular beat    Low voltage in limb leads.   -Nonspecific QRS widening.   -Old inferior-apical infarct  -Poor R-wave progression -may be secondary to pulmonary disease   consider old anterior infarct  -Left axis secondary to infarct -consider anterior fascicular block.   -  Nonspecific T-abnormality.     Assessment & Plan:     1. Elevated brain natriuretic peptide (BNP) level NSR and no sign of CHF on chest XR. Edema is improved on furosemide which we will continue through the weekend.  - EKG 12-Lead - Spirometry with graph  2. Shortness of breath FVC has declined compared to spirometry of 11/2014 Change Symbicort to 160 2 puffs BID. Consider pulmonary referral.   - Spirometry with graph        Lelon Huh, MD  [facility]

## 2016-06-19 ENCOUNTER — Other Ambulatory Visit: Payer: Self-pay | Admitting: *Deleted

## 2016-06-19 ENCOUNTER — Other Ambulatory Visit: Payer: Self-pay | Admitting: Family Medicine

## 2016-06-21 ENCOUNTER — Other Ambulatory Visit: Payer: Self-pay | Admitting: Family Medicine

## 2016-06-21 MED ORDER — BENAZEPRIL-HYDROCHLOROTHIAZIDE 20-12.5 MG PO TABS: 1.0000 | ORAL_TABLET | Freq: Every day | ORAL | Status: DC

## 2016-06-21 MED ORDER — BUDESONIDE-FORMOTEROL FUMARATE 160-4.5 MCG/ACT IN AERO: 2.0000 | INHALATION_SPRAY | Freq: Two times a day (BID) | RESPIRATORY_TRACT | Status: DC

## 2016-06-21 MED ORDER — LORAZEPAM 1 MG PO TABS: 1.0000 mg | ORAL_TABLET | Freq: Every evening | ORAL | Status: DC | PRN

## 2016-06-21 NOTE — Progress Notes (Signed)
Patient sent e-mail message stating he has been taking 1 tablet daily benazepril-hctz 20-12.5, not 1/2 daily as previously ordered . New order with 1 daily sent to [facility]

## 2016-06-21 NOTE — Telephone Encounter (Signed)
Please call in lorazepam.  

## 2016-06-21 NOTE — Telephone Encounter (Signed)
Rx called in to pharmacy. 

## 2016-07-04 ENCOUNTER — Encounter: Payer: Self-pay | Admitting: Family Medicine

## 2016-07-04 ENCOUNTER — Telehealth: Payer: Self-pay | Admitting: Family Medicine

## 2016-07-04 ENCOUNTER — Ambulatory Visit (INDEPENDENT_AMBULATORY_CARE_PROVIDER_SITE_OTHER): Payer: Medicare Other | Admitting: Family Medicine

## 2016-07-04 VITALS — BP 92/48 | HR 45 | Temp 98.0°F | Resp 16 | Wt 207.0 lb

## 2016-07-04 DIAGNOSIS — M5090 Cervical disc disorder, unspecified, unspecified cervical region: Secondary | ICD-10-CM | POA: Diagnosis not present

## 2016-07-04 DIAGNOSIS — J449 Chronic obstructive pulmonary disease, unspecified: Secondary | ICD-10-CM | POA: Diagnosis not present

## 2016-07-04 MED ORDER — HYDROCODONE-ACETAMINOPHEN 5-325 MG PO TABS: 1.0000 | ORAL_TABLET | Freq: Two times a day (BID) | ORAL | Status: DC | PRN

## 2016-07-04 NOTE — Telephone Encounter (Signed)
If they require trial of PT first, then can go ahead and order PT and have patient schedule follow up in a month

## 2016-07-04 NOTE — Telephone Encounter (Signed)
Pt's insurance company would like a peer to peer to get MRI approved. ( Pt has not had physical therapy from what I can tell) Phone [phone].Member # [id number]

## 2016-07-04 NOTE — Telephone Encounter (Signed)
Please add order for physical therapy to [facility]

## 2016-07-04 NOTE — Progress Notes (Signed)
Patient: James MAYBURY Sr. Male    DOB: 10-Jun-1929   82 y.o.   MRN: [id number] Visit Date: 07/04/2016  Today's Provider: Lelon Huh, MD   Chief Complaint  Patient presents with  . Neck Pain   Subjective:    Neck Pain  This is a new problem. The current episode started more than 1 month ago (2 months). The problem occurs constantly. The problem has been gradually worsening. The pain is associated with an unknown factor. The pain is present in the right side. The quality of the pain is described as burning and shooting. The pain is at a severity of 6/10. The pain is moderate. The symptoms are aggravated by twisting and position. The pain is same all the time. Stiffness is present all day. Associated symptoms include numbness, photophobia, tingling and a visual change. Pertinent negatives include no chest pain, fever, headaches, leg pain, pain with swallowing, paresis, syncope, trouble swallowing, weakness or weight loss. Treatments tried: hydrocodone  The treatment provided mild relief.    Patient has had right sided neck pain since May 2017. Patient saw Dr. Sharlet Salina who did an x-ray and gave pt prednisone. Was last seen 05/04/16. Medication did not help with pain. Patient has symptoms of numbness, burning, and pain in right shoulder. Has also been prescribed hydrocodone/apap which is effective, but makes him sleepy so he only takes at bedtime to help rest at night. Patient states that his neck pain is gradually worsening. Does have pain when lifting and with certain movements. Discussed physical therapy, but has not yet been set up.     C-Spine 03/21/2016 from [facility] X-rays of the cervical spine including AP, lateral, and oblique views were  obtained in the office today.  There is reversal of the normal cervical  lordotic curvature.  At C2-3 there is a slight grade 1 anterior listhesis.   The disc space appears well preserved.  At C3-4 there is moderate  intervertebral  disc space narrowing with anterior osteophyte formation  consistent with moderate to severe degenerative disc disease.  At C4-5  there is severe degenerative disc disease with near obliteration of the  disc space and anterior osteophyte formation.  At C5-6 there is severe  degenerative disc disease.  Multilevel advanced facet spondylosis is  noted.  On the oblique views multilevel bilateral foraminal stenosis is  noted, most advanced at C3-4 and C4-5.  Overall multilevel severe  degenerative changes.  MRI 01/31/2010 [facility] At L5-S1 there is broad-based disc bulging and facet arthropathy with severe bilateral foraminal stenosis. There is no central stenosis. At L4-5 there are bilateral pars defects and a grade 2 anterior listhesis. There is severe bilateral foraminal stenosis. There is no central stenosis. At L3-4 there is no significant central or foraminal stenosis. At L2-3 there is broad-based disc bulging without central stenosis. There is mild bilateral foraminal stenosis.   Follow up COPD States much less short of breath on exertion since starting Spiriva and wants to continue.  Allergies  Allergen Reactions  . Indomethacin Nausea Only   Current Meds  Medication Sig  . aspirin EC 325 MG tablet Take by mouth.  [name] atorvastatin (LIPITOR) 80 MG tablet Take 1 tablet (80 mg total) by mouth daily.  [name] azelastine (OPTIVAR) 0.05 % ophthalmic solution Apply 1 drop to eye 2 (two) times daily.   . benazepril-hydrochlorthiazide (LOTENSIN HCT) 20-12.5 MG tablet Take 1 tablet by mouth daily.  . beta carotene w/minerals (OCUVITE) tablet  Take 1 tablet by mouth daily.  . budesonide-formoterol (SYMBICORT) 160-4.5 MCG/ACT inhaler Inhale 2 puffs into the lungs 2 (two) times daily.  . Coenzyme Q10 (CO Q 10) 100 MG CAPS Take by mouth.  . fluticasone (FLONASE) 50 MCG/ACT nasal spray Place 2 sprays into both nostrils daily.  . furosemide (LASIX) 20 MG tablet Take 1 tablet (20 mg total) by mouth daily.  [name]  HYDROcodone-acetaminophen (NORCO/VICODIN) 5-325 MG tablet Take 1 tablet by mouth 2 (two) times daily as needed.  . lansoprazole (PREVACID) 30 MG capsule TAKE ONE CAPSULE EVERY DAY  . LORazepam (ATIVAN) 1 MG tablet Take 1 tablet (1 mg total) by mouth at bedtime as needed for anxiety.  . Magnesium 400 MG CAPS Take 1 capsule by mouth daily.   . metoprolol (LOPRESSOR) 50 MG tablet Take 1 tablet (50 mg total) by mouth 2 (two) times daily.  . montelukast (SINGULAIR) 10 MG tablet TAKE 1 TABLET BY MOUTH DAILY  . MULTIPLE VITAMIN PO Take 1 tablet by mouth daily.   . naproxen (NAPROSYN) 500 MG tablet Take 500 mg by mouth 2 (two) times daily as needed.   . OMEGA-3 FATTY ACIDS PO Take 1,000 mg by mouth daily.   . tamsulosin (FLOMAX) 0.4 MG CAPS capsule Take 1 capsule (0.4 mg total) by mouth daily.  [name] tiotropium (SPIRIVA HANDIHALER) 18 MCG inhalation capsule 1 capsule inhaled through device every day  . traMADol (ULTRAM) 50 MG tablet Take 50 mg by mouth 2 (two) times daily.     Review of Systems  Constitutional: Negative for fever, chills, weight loss and appetite change.  HENT: Negative for trouble swallowing.   Eyes: Positive for photophobia.  Respiratory: Negative for chest tightness, shortness of breath and wheezing.   Cardiovascular: Negative for chest pain, palpitations and syncope.  Gastrointestinal: Negative for nausea, vomiting and abdominal pain.  Musculoskeletal: Positive for neck pain and neck stiffness.  Neurological: Positive for tingling and numbness. Negative for weakness and headaches.    Social History  Substance Use Topics  . Smoking status: Former Smoker -- 1.00 packs/day for 11 years    Types: Cigarettes    Quit date: 12/18/1978  . Smokeless tobacco: Not on file  . Alcohol Use: 0.0 oz/week    0 Standard drinks or equivalent per week     Comment: Moderate use; 2 beers per week   Objective:   BP 92/48 mmHg  Pulse 45  Temp(Src) 98 F (36.7 C) (Oral)  Resp 16  Wt 207 lb  (93.895 kg)  SpO2 95%  Physical Exam   General Appearance:    Alert, cooperative, no distress  Eyes:    PERRL, conjunctiva/corneas clear, EOM's intact       Lungs:     Clear to auscultation bilaterally, respirations unlabored  Heart:    Regular rate and rhythm  Neurologic:   MS +4 right upper extremity. +5 left upper extremity. DTR +1 and symmetric UEs. Normal s/s Numbness in right neck and shoulder when rotating head to limits of ROM.           Assessment & Plan:     1. Cervical neck pain with evidence of disc disease Refill hydrocodone/apap - MR Cervical Spine W Wo Contrast; Future  2. Chronic obstructive pulmonary disease, unspecified COPD type ([place]) Much better since adding Spiriva. Continue current inhalers.        Lelon Huh, MD  [facility]

## 2016-07-06 NOTE — Telephone Encounter (Signed)
Pt does want referral for physical therapy and will call to schedule appointment with Dr Caryn Section once physical therapy appointment has been made.Please add order,Thanks

## 2016-07-13 ENCOUNTER — Telehealth: Payer: Self-pay | Admitting: Family Medicine

## 2016-07-13 NOTE — Telephone Encounter (Signed)
See below

## 2016-07-13 NOTE — Telephone Encounter (Signed)
James [name] from [employer] called to let you know that case is still open for peer to peer to get MRI approved.Phone [phone]

## 2016-07-13 NOTE — Telephone Encounter (Signed)
Please review when you get back-aa

## 2016-07-13 NOTE — Telephone Encounter (Signed)
James [name] from [employer] called back stating that she had given wrong phone # to contact for peer to peer.Correct phone # is [phone].See previous note

## 2016-07-17 ENCOUNTER — Other Ambulatory Visit: Payer: Self-pay

## 2016-07-17 MED ORDER — ATORVASTATIN CALCIUM 80 MG PO TABS: 80.0000 mg | ORAL_TABLET | Freq: Every day | ORAL | 4 refills | Status: DC

## 2016-07-17 NOTE — Telephone Encounter (Signed)
Patient sent an e mail requesting a refill

## 2016-07-19 ENCOUNTER — Other Ambulatory Visit: Payer: Self-pay | Admitting: Family Medicine

## 2016-07-19 DIAGNOSIS — R0602 Shortness of breath: Secondary | ICD-10-CM

## 2016-07-19 NOTE — Telephone Encounter (Signed)
Pharmacy contacted office for refill request on the following medications: tiotropium (SPIRIVA HANDIHALER) 18 MCG inhalation capsule to [facility] stated that pt was there to pick up the RX and asked for approval. They stated pt said he called this morning and asked for the refill. I advised the only request we had was the request the pharmacy just sent this afternoon. Please advise. Thanks TNP

## 2016-07-19 NOTE — Telephone Encounter (Signed)
Patient stated that he was aware of this and that his PT has been scheduled to begin 08/08/2016.

## 2016-07-19 NOTE — Telephone Encounter (Signed)
Please advise patient that insurance won't cover MRI until he tries physical therapy first. If Dr. Sharlet Salina did not order PT then we can go ahead and order it for him. Schedule follow up o.v. 3-4 weeks after starting PT.

## 2016-08-08 ENCOUNTER — Ambulatory Visit: Payer: Medicare Other | Attending: Family Medicine

## 2016-08-08 VITALS — BP 148/71 | HR 98

## 2016-08-08 DIAGNOSIS — R293 Abnormal posture: Secondary | ICD-10-CM | POA: Diagnosis not present

## 2016-08-08 DIAGNOSIS — M542 Cervicalgia: Secondary | ICD-10-CM | POA: Diagnosis not present

## 2016-08-08 NOTE — Therapy (Signed)
[place] MAIN [facility] SERVICES [address] [place], [place], [address] Phone: [phone]   Fax:  [phone]  Physical Therapy Evaluation  Patient Details  Name: James CHEATUM Sr. MRN: [id number] Date of Birth: [birth year] Referring Provider: Lelon Huh  Encounter Date: 08/08/2016      PT End of Session - 08/09/16 1539    Visit Number 1   Number of Visits 13   Date for PT Re-Evaluation October 07, 2016   Authorization Type g codes 1/10   PT Start Time 1430   PT Stop Time 1530   PT Time Calculation (min) 60 min   Activity Tolerance Patient tolerated treatment well   Behavior During Therapy [facility] for tasks assessed/performed      Past Medical History:  Diagnosis Date  . Back pain   . CAD (coronary artery disease)   . Diverticulosis   . GERD (gastroesophageal reflux disease)   . History of chicken pox   . History of measles   . History of mumps   . Hyperglycemia   . Hyperlipidemia   . Hypertension   . Hypotension   . Prostate cancer ([place])   . TIA (transient ischemic attack)     Past Surgical History:  Procedure Laterality Date  . APPENDECTOMY  1950  . CARDIAC CATHETERIZATION  05/29/2013   EF=40-45%. Moderate pulmonary hypertension. Infero/ lateral hypokinesis  . CORONARY ARTERY BYPASS GRAFT  2011   with 4 grafts  . MRI BRAIN  06/07/2013   Mild chronic involutional changes. No acute abnormalities  . Myocardial Perfusion Scan  05/29/2013   Abnormal myocardial perfusion image consistent with myocardial infarction  . PROSTATE SURGERY  11/18/2012    Needle biopsy; Adenocarcinoma of Prostate; Dr. Bernardo Heater  . TOTAL HIP ARTHROPLASTY Left 1997   hip joint replacement    Vitals:   08/08/16 1438  BP: (!) 148/71  Pulse: 98  SpO2: 93%         Subjective Assessment - 08/08/16 1446    Subjective R sided neck pain   Pertinent History Pt reports R sided neck pain which radiates to R jaw and down into R shoulder. This started  approximately 2 months ago. He denies any injury. Pt states that he has been very short of breath recently. He has an appt with his cardiologist tomorrow. Pt has a history of CABG x 5. No history of similar pain in the past. Pt reports that the pain is intermittent and will last for 15 minutes at a time. He states that the pain worsens with straining when lifting something heavy. ROS negative for red flags.     Limitations Other (comment)  Denies limitation   Diagnostic tests Radiographs 03/2016: degenerative changes   Patient Stated Goals Decrease pain   Currently in Pain? Yes   Pain Score 0-No pain  Worst: 8/10, Best: 0/10; Present: 0/10   Pain Location Neck   Pain Orientation Right   Pain Descriptors / Indicators Burning;Numbness   Pain Type Chronic pain  Last 4 months   Pain Radiating Towards R jaw and down into R shoulder. Does not pass the shoulder into the arm   Pain Onset More than a month ago   Pain Frequency Intermittent   Aggravating Factors  Straining, unable to determine any motions of head/neck that bother   Pain Relieving Factors None   Multiple Pain Sites No  Lower back pain, unrelated to current episode            [facility]  PT Assessment - 08/08/16 1450      Assessment   Medical Diagnosis Cervical neck pain with evidence of disc disease   Referring Provider Lelon Huh   Onset Date/Surgical Date 04/08/16  Approximate   Hand Dominance Right   Next MD Visit None scheduled   Prior Therapy None     Precautions   Precautions None     Restrictions   Weight Bearing Restrictions No     Balance Screen   Has the patient fallen in the past 6 months No   Has the patient had a decrease in activity level because of a fear of falling?  No     Home Ecologist residence     Prior Function   Level of Independence Independent     Cognition   Overall Cognitive Status Within Functional Limits for tasks assessed     Observation/Other  Assessments   Other Surveys  Other Surveys   Neck Disability Index  44%     Sensation   Light Touch Appears Intact   Additional Comments C2-T1 intact to light touch     Posture/Postural Control   Posture Comments Forward head and rounded shoulders     ROM / Strength   AROM / PROM / Strength AROM;Strength     AROM   Overall AROM Comments UE grossly WFL without notable deficits. Active and passive L cervical rotation painless with OP, active and passive R cervical rotation are both painful, worsens with OP, active and passive cervical extension is painful, active and passive cervical flexion is painless, Active and passive lateral flexion is painless in both directions including with overpressure. Positive Hoffman L side, negative R side. Negative clonus or spasticity in bilateral UE. Pain with C5-C7 CPA but more central pressure. Positive reproduction of pain with C3-C5 R UPA. Still unable to reproduce radiating pain into jaw. Pain with first rib mobilizations. which is considerably higher on R side compared to L side. Pt reports he is unable to lay flat so cervical mobility assessed in sitting.    AROM Assessment Site Cervical   Cervical Flexion 32   Cervical Extension 34   Cervical - Right Side Bend 30   Cervical - Left Side Bend 30   Cervical - Right Rotation 50   Cervical - Left Rotation 47     Strength   Overall Strength Comments Cervical isometrics are strong and painless. UE strength grossly WFL without notable weakness     Palpation   Palpation comment Tender to palpation along R upper trap more distal near clavicular attachment. No radidating pain with tigger points in traps. No pain with palpation of SCM     Special Tests    Special Tests Cervical   Cervical Tests Spurling's;Dictraction     Spurling's   Findings Positive   Side Right     Distraction Test   side Right   Comment Pt reports "it feels good" but no real change in pain. Pt not having resting pain at time of  testing     Transfers   Comments Independent       TREATMENT  THER-EX  Pt instructed and performed cervical retractions, scapular retractions, and scalene stretch; Pt issued written HEP and provided correction regarding technique and form;                  PT Education - 08/09/16 1539    Education provided Yes   Education Details HEP and plan of  care   Person(s) Educated Patient   Methods Explanation   Comprehension Verbalized understanding             PT Long Term Goals - 08/09/16 1549      PT LONG TERM GOAL #1   Title Pt will be independent with HEP in order to decrease neck pain and improve function   Time 8   Period Weeks   Status New     PT LONG TERM GOAL #2   Title Pt will decrease NDI by at least 19% in order to demonstrate clinically significant reduction in pain and disability   Baseline 08/08/16: 44%   Time 8   Period Weeks   Status New     PT LONG TERM GOAL #3   Title Pt will improve cervical rotation by at least 5 degrees bilaterally in order to improve ability to look over his shoulder.    Baseline 08/08/16: R: 50 degrees, L: 47   Time 8   Period Weeks   Status New               Plan - 08/09/16 1541    Clinical Impression Statement Pt is a pleasant 82 yo male referred for R sided neck pain radiating into R shoulder and R jaw. NDI: 44% indicating significant disability from neck pain. PT evaluation reveals limited cervical rotation bilaterally, painful to the R. Pt also with limited and painful cervical extension. Positive spurling's and distraction. Painful posterior to anterior mobility testing both centrally and unilaterally on the R for C5-C7.[name] Pt denies numbness/tingling in face, neck, or RUE and unable to reproduce R jaw pain. Jaw pain appears to follow an upper cervical dermatomal distribution and is very transient in nature. Mild reproduction of pain with palpation of right posterior neck especially upper traps near distal  attachment at clavicle. Pt will benefit from skilled PT services in order to decrease neck pain and improve pain-free function.    Rehab Potential Good   Clinical Impairments Affecting Rehab Potential Positive: motivation; Negative: age   PT Frequency 2x / week   PT Duration 6 weeks   PT Treatment/Interventions Cryotherapy;Electrical Stimulation;Iontophoresis '4mg'$ /ml Dexamethasone;Moist Heat;Traction;Gait training;Therapeutic activities;Therapeutic exercise;Balance training;Neuromuscular re-education;Patient/family education;Manual techniques;Passive range of motion;Dry needling   PT Next Visit Plan STM and cervical mobilizations, manual distraction, pt unable to lay supine   PT Home Exercise Plan repeated cervical retractions, scapular retractions   Consulted and Agree with Plan of Care Patient      Patient will benefit from skilled therapeutic intervention in order to improve the following deficits and impairments:  Pain, Decreased range of motion  Visit Diagnosis: Cervicalgia - Plan: PT plan of care cert/re-cert  Abnormal posture - Plan: PT plan of care cert/re-cert      G-Codes - 40/81/44 1557    Functional Assessment Tool Used NDI, clinical judgement   Functional Limitation Mobility: Walking and moving around   Mobility: Walking and Moving Around Current Status (Y1856) At least 40 percent but less than 60 percent impaired, limited or restricted   Mobility: Walking and Moving Around Goal Status [id number]) At least 1 percent but less than 20 percent impaired, limited or restricted       Problem List Patient Active Problem List   Diagnosis Date Noted  . Cervical neck pain with evidence of disc disease 07/04/2016  . PVC (premature ventricular contraction) 11/16/2015  . Abdominal pain 07/27/2015  . Adenocarcinoma of prostate ([place]) 07/27/2015  . Arthritis 07/27/2015  . Blood in feces  07/27/2015  . BPH (benign prostatic hyperplasia) 07/27/2015  . COPD (chronic obstructive pulmonary  disease) ([place]) 07/27/2015  . GERD (gastroesophageal reflux disease) 07/27/2015  . Gout 07/27/2015  . Hyperglycemia 07/27/2015  . Hypotension 07/27/2015  . Insomnia 07/27/2015  . Shortness of breath 07/27/2015  . TIA (transient ischemic attack) 07/27/2015  . Atrial fibrillation ([place]) 11/16/2014  . BP (high blood pressure) 10/13/2014  . Urinary hesitancy 09/02/2012  . ED (erectile dysfunction) of organic origin 06/10/2010  . CAD (coronary artery disease) 03/14/2010  . LBP (low back pain) 03/01/2009  . Allergic rhinitis 09/28/2008  . Essential (primary) hypertension 08/02/2007  . Hyperlipidemia 08/02/2007  . Acquired spondylolisthesis 03/20/2005  . Diverticulosis of colon without hemorrhage 12/18/2000  . Arthropathy 12/18/1998   Phillips Grout PT, DPT   Jaquin Coy 08/09/2016, 5:20 PM  [place] MAIN [facility] SERVICES [address] [place], [place], [address] Phone: [phone]   Fax:  [phone]  Name: James WHELLER Sr. MRN: [id number] Date of Birth: [birth year]

## 2016-08-08 NOTE — Patient Instructions (Signed)
Scapular Retraction (Standing)    With arms at sides, pinch shoulder blades down and together. Repeat _10___ times per set. Do __2__ sets per session. Do __2__ sessions per day.   Neck Stretch (Chair) - Variation 1    Right hand holding edge of chair, tip head to left side, chin slightly tucked. Optional: Place other hand above temple for sensitive pressure to deepen stretch. Hold for _30 seconds. Repeat _3___ times, 2 times day.   Stretch Break - Chin Tuck    Looking straight forward, tuck chin push head straight back. Hold for _5___ seconds. Relax and return to starting position. Repeat 10 times. Relax and perform another 10 times. Perform 2 times/day.

## 2016-08-09 DIAGNOSIS — E782 Mixed hyperlipidemia: Secondary | ICD-10-CM | POA: Diagnosis not present

## 2016-08-09 DIAGNOSIS — J42 Unspecified chronic bronchitis: Secondary | ICD-10-CM | POA: Diagnosis not present

## 2016-08-09 DIAGNOSIS — R0609 Other forms of dyspnea: Secondary | ICD-10-CM | POA: Diagnosis not present

## 2016-08-09 DIAGNOSIS — I2510 Atherosclerotic heart disease of native coronary artery without angina pectoris: Secondary | ICD-10-CM | POA: Diagnosis not present

## 2016-08-09 DIAGNOSIS — R0602 Shortness of breath: Secondary | ICD-10-CM | POA: Diagnosis not present

## 2016-08-09 DIAGNOSIS — I10 Essential (primary) hypertension: Secondary | ICD-10-CM | POA: Diagnosis not present

## 2016-08-09 DIAGNOSIS — I4891 Unspecified atrial fibrillation: Secondary | ICD-10-CM | POA: Diagnosis not present

## 2016-08-10 ENCOUNTER — Ambulatory Visit: Payer: Medicare Other

## 2016-08-10 VITALS — BP 134/67 | HR 70

## 2016-08-10 DIAGNOSIS — R293 Abnormal posture: Secondary | ICD-10-CM

## 2016-08-10 DIAGNOSIS — M542 Cervicalgia: Secondary | ICD-10-CM | POA: Diagnosis not present

## 2016-08-10 NOTE — Patient Instructions (Signed)
Hand written instructions on HEP handout to cue pt to relax shoulders with scapular retraction exercise as practiced this session.

## 2016-08-10 NOTE — Therapy (Signed)
[place] MAIN [facility] SERVICES [address] [place], [place], [address] Phone: [phone]   Fax:  [phone]  Physical Therapy Treatment  Patient Details  Name: James SABADO Sr. MRN: [id number] Date of Birth: October 11, 1929 Referring Provider: Lelon Huh  Encounter Date: 08/10/2016      PT End of Session - 08/10/16 1531    Visit Number 2   Number of Visits 13   Date for PT Re-Evaluation Oct 10, 2016   Authorization Type g codes 02/17/2023   PT Start Time 7412   PT Stop Time 8786   PT Time Calculation (min) 44 min   Activity Tolerance Patient tolerated treatment well;No increased pain   Behavior During Therapy WFL for tasks assessed/performed      Past Medical History:  Diagnosis Date  . Back pain   . CAD (coronary artery disease)   . Diverticulosis   . GERD (gastroesophageal reflux disease)   . History of chicken pox   . History of measles   . History of mumps   . Hyperglycemia   . Hyperlipidemia   . Hypertension   . Hypotension   . Prostate cancer ([place])   . TIA (transient ischemic attack)     Past Surgical History:  Procedure Laterality Date  . APPENDECTOMY  1950  . CARDIAC CATHETERIZATION  05/29/2013   EF=40-45%. Moderate pulmonary hypertension. Infero/ lateral hypokinesis  . CORONARY ARTERY BYPASS GRAFT  2011   with 4 grafts  . MRI BRAIN  06/07/2013   Mild chronic involutional changes. No acute abnormalities  . Myocardial Perfusion Scan  05/29/2013   Abnormal myocardial perfusion image consistent with myocardial infarction  . PROSTATE SURGERY  11/18/2012    Needle biopsy; Adenocarcinoma of Prostate; Dr. Bernardo Heater  . TOTAL HIP ARTHROPLASTY Left 1997   hip joint replacement    Vitals:   08/10/16 1523  BP: 134/67  Pulse: 70  SpO2: 94%        Subjective Assessment - 08/10/16 1525    Subjective Pt reports he did not experience pain the day after his evaluation but has had more pain today, "I think I did something to  aggravate it".  Pt unable to think of any non-routine activities he may have done.  Has been completing his HEP.   Pertinent History Pt reports R sided neck pain which radiates to R jaw and down into R shoulder. This started approximately 2 months ago. He denies any injury. Pt states that he has been very short of breath recently. He has an appt with his cardiologist tomorrow. Pt has a history of CABG x 5. No history of similar pain in the past. Pt reports that the pain is intermittent and will last for 15 minutes at a time. He states that the pain worsens with straining when lifting something heavy. ROS negative for red flags.     Limitations Other (comment)  Denies limitation   Diagnostic tests Radiographs 03/2016: degenerative changes   Patient Stated Goals Decrease pain   Currently in Pain? Yes   Pain Score 4    Pain Location Neck   Pain Orientation Right   Pain Descriptors / Indicators Numbness;Grimacing;Discomfort   Pain Onset More than a month ago          TREATMENT:  Therapeutic Exercise: Reviewed and pt performed HEP: Cervical retractions, scapular retractions, and scalene stretch.  Postural cues to relax shoulders and depress scapulae during scapular retractions.   Cervical rotation AROM 4x10 following cervical mobilizations and MET.  Seated thoracic extension with towel roll 3x10  Manual Therapy: C3-7 upglides x30 seconds at each level (in supine) C5-7 CPA x 30 seconds at each level (in sitting) C3-C5 R UPA mobs x30 seconds at each level (in sitting) 1st rib mob Bil 3x20 seconds (in sitting).  Head in neutral as pt reports Rt neck numbness with Rt lateral flexion during mob MET Bil cervical rotation, Rt rotation limited to just before pain.  Not limited by pain or numbness on Lt. MET cervical lateral flexion Bil with head in neutral as pt unable to tolerate lateral flexion due to pain          PT Education - 08/10/16 1531    Education provided Yes   Education  Details Exercise technique, posture             PT Long Term Goals - 08/09/16 1549      PT LONG TERM GOAL #1   Title Pt will be independent with HEP in order to decrease neck pain and improve function   Time 8   Period Weeks   Status New     PT LONG TERM GOAL #2   Title Pt will decrease NDI by at least 19% in order to demonstrate clinically significant reduction in pain and disability   Baseline 08/08/16: 44%   Time 8   Period Weeks   Status New     PT LONG TERM GOAL #3   Title Pt will improve cervical rotation by at least 5 degrees bilaterally in order to improve ability to look over his shoulder.    Baseline 08/08/16: R: 50 degrees, L: 47   Time 8   Period Weeks   Status New               Plan - 08/10/16 1532    Clinical Impression Statement Pt demonstrated poor posture with HEP and cues provided to relax shoulders which pt has most difficulty with throughout session.  Tactile and verbal cues provided with cervical AROM and therapeutic exercises to relax shoulders and depress scapula.  Improved cervical Bil rotation AROM and pt reported decrease in Rt neck pain following mobilizations and MET.  He was very appreciative at end of session and will benefit from continued strengthening, manual therapy, and therapeutic exercise programs.   Rehab Potential Good   Clinical Impairments Affecting Rehab Potential Positive: motivation; Negative: age   PT Frequency 2x / week   PT Duration 6 weeks   PT Treatment/Interventions Cryotherapy;Electrical Stimulation;Iontophoresis 41m/ml Dexamethasone;Moist Heat;Traction;Gait training;Therapeutic activities;Therapeutic exercise;Balance training;Neuromuscular re-education;Patient/family education;Manual techniques;Passive range of motion;Dry needling   PT Next Visit Plan STM and cervical mobilizations, manual distraction, MET, pt able to lie supine for short period of time (5-10 mins).   PT Home Exercise Plan repeated cervical retractions,  scapular retractions, scalene stretch; Hand written instructions on HEP handout to cue pt to relax shoulders with scapular retraction exercise as practiced this session.   Consulted and Agree with Plan of Care Patient      Patient will benefit from skilled therapeutic intervention in order to improve the following deficits and impairments:  Pain, Decreased range of motion  Visit Diagnosis: Cervicalgia  Abnormal posture     Problem List Patient Active Problem List   Diagnosis Date Noted  . Cervical neck pain with evidence of disc disease 07/04/2016  . PVC (premature ventricular contraction) 11/16/2015  . Abdominal pain 07/27/2015  . Adenocarcinoma of prostate ([place] 07/27/2015  . Arthritis 07/27/2015  . Blood in feces  07/27/2015  . BPH (benign prostatic hyperplasia) 07/27/2015  . COPD (chronic obstructive pulmonary disease) ([place]) 07/27/2015  . GERD (gastroesophageal reflux disease) 07/27/2015  . Gout 07/27/2015  . Hyperglycemia 07/27/2015  . Hypotension 07/27/2015  . Insomnia 07/27/2015  . Shortness of breath 07/27/2015  . TIA (transient ischemic attack) 07/27/2015  . Atrial fibrillation ([place]) 11/16/2014  . BP (high blood pressure) 10/13/2014  . Urinary hesitancy 09/02/2012  . ED (erectile dysfunction) of organic origin 06/10/2010  . CAD (coronary artery disease) 03/14/2010  . LBP (low back pain) 03/01/2009  . Allergic rhinitis 09/28/2008  . Essential (primary) hypertension 08/02/2007  . Hyperlipidemia 08/02/2007  . Acquired spondylolisthesis 03/20/2005  . Diverticulosis of colon without hemorrhage 12/18/2000  . Arthropathy 12/18/1998     Collie Siad PT, DPT  08/11/2016, 10:17 AM Phillips Grout PT, DPT    [place] MAIN [facility] [address] [place], [place], [address] Phone: [phone]   Fax:  [phone]  Name: James REGNIER Sr. MRN: [id number] Date of Birth: 06-15-1929

## 2016-08-14 ENCOUNTER — Ambulatory Visit: Payer: Medicare Other

## 2016-08-14 DIAGNOSIS — R293 Abnormal posture: Secondary | ICD-10-CM

## 2016-08-14 DIAGNOSIS — M542 Cervicalgia: Secondary | ICD-10-CM

## 2016-08-14 NOTE — Therapy (Signed)
[place] MAIN [facility] SERVICES [address] [place], [place], [address] Phone: [phone]   Fax:  [phone]  Physical Therapy Treatment  Patient Details  Name: James [name] Sr. MRN: [id number] Date of Birth: [birth year] Referring Provider: Lelon Huh  Encounter Date: 08/14/2016      PT End of Session - 08/14/16 1356    Visit Number 3   Number of Visits 13   Date for PT Re-Evaluation 14-Oct-2016   Authorization Type g codes 3/10   PT Start Time 9924   PT Stop Time 1430   PT Time Calculation (min) 35 min   Activity Tolerance Patient tolerated treatment well;No increased pain   Behavior During Therapy WFL for tasks assessed/performed      Past Medical History:  Diagnosis Date  . Back pain   . CAD (coronary artery disease)   . Diverticulosis   . GERD (gastroesophageal reflux disease)   . History of chicken pox   . History of measles   . History of mumps   . Hyperglycemia   . Hyperlipidemia   . Hypertension   . Hypotension   . Prostate cancer ([place])   . TIA (transient ischemic attack)     Past Surgical History:  Procedure Laterality Date  . APPENDECTOMY  1950  . CARDIAC CATHETERIZATION  05/29/2013   EF=40-45%. Moderate pulmonary hypertension. Infero/ lateral hypokinesis  . CORONARY ARTERY BYPASS GRAFT  2011   with 4 grafts  . MRI BRAIN  06/07/2013   Mild chronic involutional changes. No acute abnormalities  . Myocardial Perfusion Scan  05/29/2013   Abnormal myocardial perfusion image consistent with myocardial infarction  . PROSTATE SURGERY  11/18/2012    Needle biopsy; Adenocarcinoma of Prostate; Dr. Bernardo Heater  . TOTAL HIP ARTHROPLASTY Left 1997   hip joint replacement    There were no vitals filed for this visit.      Subjective Assessment - 08/14/16 1354    Subjective Pt states that he is having some increased pain today. He states that he is having some more burning in his neck and R jaw. No specific questions or  concerns at this time.    Pertinent History Pt reports R sided neck pain which radiates to R jaw and down into R shoulder. This started approximately 2 months ago. He denies any injury. Pt states that he has been very short of breath recently. He has an appt with his cardiologist tomorrow. Pt has a history of CABG x 5. No history of similar pain in the past. Pt reports that the pain is intermittent and will last for 15 minutes at a time. He states that the pain worsens with straining when lifting something heavy. ROS negative for red flags.     Limitations Other (comment)  Denies limitation   Diagnostic tests Radiographs 03/2016: degenerative changes   Patient Stated Goals Decrease pain   Currently in Pain? Yes   Pain Score 5    Pain Location Neck   Pain Orientation Right   Pain Descriptors / Indicators Numbness  Stinging   Pain Type Chronic pain   Pain Radiating Towards R neck and R jaw   Pain Onset More than a month ago   Pain Frequency Intermittent   Aggravating Factors  Straining   Pain Relieving Factors None       TREATMENT:  Manual Therapy All manual therapy performed in sitting; C3-C4 R UPA mobs, grade III, 30 seconds/bout x 5 bouts/level; Mobilization with movement, L cervical  rotation following to end range followed by hold with AROM R cervical rotation 2 x 10, pain-free R cervical rotation following as well as increase in AROM; R 1st rib mobilization, grade III, 30 seconds/bout x 3 bouts, some positive reproduction of pain around R ear; Repeated cervical retractions 5 seconds hold 2 x 10; Scalene stretches in sitting 30 seconds x 2; Seated thoracic extension with towel roll 2 x 10 with overpressure from therapist; STM to R upper trap and levator scapulae near upper cervical attachments with some positive reproduction of pain around R ear; Biofreeze applied to R neck for patients comfort following therapy;                         PT Education -  08/14/16 1356    Education provided Yes   Education Details HEP    Person(s) Educated Patient   Methods Explanation   Comprehension Verbalized understanding             PT Long Term Goals - 08/09/16 1549      PT LONG TERM GOAL #1   Title Pt will be independent with HEP in order to decrease neck pain and improve function   Time 8   Period Weeks   Status New     PT LONG TERM GOAL #2   Title Pt will decrease NDI by at least 19% in order to demonstrate clinically significant reduction in pain and disability   Baseline 08/08/16: 44%   Time 8   Period Weeks   Status New     PT LONG TERM GOAL #3   Title Pt will improve cervical rotation by at least 5 degrees bilaterally in order to improve ability to look over his shoulder.    Baseline 08/08/16: R: 50 degrees, L: 47   Time 8   Period Weeks   Status New               Plan - 08/14/16 1356    Clinical Impression Statement Pt demonstrates improved R pain-free cervical rotation following manual therapy. Positive reproduction of pain with first rib mobilizations, STM, and R upper cervical UPA. Pt reports no pain at end of session. Pt will benefit from continued PT services to address to decrease neck pain.    Rehab Potential Good   Clinical Impairments Affecting Rehab Potential Positive: motivation; Negative: age   PT Frequency 2x / week   PT Duration 6 weeks   PT Treatment/Interventions Cryotherapy;Electrical Stimulation;Iontophoresis 26m/ml Dexamethasone;Moist Heat;Traction;Gait training;Therapeutic activities;Therapeutic exercise;Balance training;Neuromuscular re-education;Patient/family education;Manual techniques;Passive range of motion;Dry needling   PT Next Visit Plan STM and cervical mobilizations, 1st rib mobilizations, manual distraction, MET, pt able to lie supine for short period of time (5-10 mins).   PT Home Exercise Plan repeated cervical retractions, scapular retractions, scalene stretch; Hand written  instructions on HEP handout to cue pt to relax shoulders with scapular retraction exercise as practiced this session.   Consulted and Agree with Plan of Care Patient      Patient will benefit from skilled therapeutic intervention in order to improve the following deficits and impairments:  Pain, Decreased range of motion  Visit Diagnosis: Cervicalgia  Abnormal posture     Problem List Patient Active Problem List   Diagnosis Date Noted  . Cervical neck pain with evidence of disc disease 07/04/2016  . PVC (premature ventricular contraction) 11/16/2015  . Abdominal pain 07/27/2015  . [place] 07/27/2015  . Arthritis 07/27/2015  .  Blood in feces 07/27/2015  . BPH (benign prostatic hyperplasia) 07/27/2015  . COPD (chronic obstructive pulmonary disease) ([place]) 07/27/2015  . GERD (gastroesophageal reflux disease) 07/27/2015  . Gout 07/27/2015  . Hyperglycemia 07/27/2015  . Hypotension 07/27/2015  . Insomnia 07/27/2015  . Shortness of breath 07/27/2015  . TIA (transient ischemic attack) 07/27/2015  . Atrial fibrillation ([place]) 11/16/2014  . BP (high blood pressure) 10/13/2014  . Urinary hesitancy 09/02/2012  . ED (erectile dysfunction) of organic origin 06/10/2010  . CAD (coronary artery disease) 03/14/2010  . LBP (low back pain) 03/01/2009  . Allergic rhinitis 09/28/2008  . Essential (primary) hypertension 08/02/2007  . Hyperlipidemia 08/02/2007  . Acquired spondylolisthesis 03/20/2005  . Diverticulosis of colon without hemorrhage 12/18/2000  . Arthropathy 12/18/1998   Phillips Grout PT, DPT   Huprich,Jason 08/14/2016, 2:59 PM  [place] MAIN [facility] SERVICES [address] [place], [place], [address] Phone: [phone]   Fax:  [phone]  Name: AAIDYN SAN Sr. MRN: [id number] Date of Birth: 11-11-1929

## 2016-08-16 ENCOUNTER — Other Ambulatory Visit: Payer: Self-pay

## 2016-08-16 MED ORDER — METOPROLOL TARTRATE 50 MG PO TABS: 50.0000 mg | ORAL_TABLET | Freq: Two times a day (BID) | ORAL | 3 refills | Status: DC

## 2016-08-16 NOTE — Telephone Encounter (Signed)
Patient sent e-mail requesting refills to mail order pharmacy. Thanks!

## 2016-08-17 ENCOUNTER — Ambulatory Visit: Payer: Medicare Other | Admitting: Physical Therapy

## 2016-08-17 ENCOUNTER — Encounter: Payer: Self-pay | Admitting: Physical Therapy

## 2016-08-17 DIAGNOSIS — M542 Cervicalgia: Secondary | ICD-10-CM | POA: Diagnosis not present

## 2016-08-17 DIAGNOSIS — R293 Abnormal posture: Secondary | ICD-10-CM

## 2016-08-17 NOTE — Therapy (Signed)
[place] [facility] REHAB SERVICES [address] [place], [place], [address] Phone: [phone]   Fax:  [phone]  Physical Therapy Treatment  Patient Details  Name: James K Cimino Sr. MRN: [id number] Date of Birth: 06/09/1929 Referring Provider: Fisher, Donald  Encounter Date: 08/17/2016      PT End of Session - 08/17/16 1526    Visit Number 4   Number of Visits 13   Date for PT Re-Evaluation 09/19/16   Authorization Type g codes 4/10   PT Start Time 1432   PT Stop Time 1515   PT Time Calculation (min) 43 min   Activity Tolerance --   Behavior During Therapy WFL for tasks assessed/performed      Past Medical History:  Diagnosis Date  . Back pain   . CAD (coronary artery disease)   . Diverticulosis   . GERD (gastroesophageal reflux disease)   . History of chicken pox   . History of measles   . History of mumps   . Hyperglycemia   . Hyperlipidemia   . Hypertension   . Hypotension   . Prostate cancer ([facility])   . TIA (transient ischemic attack)     Past Surgical History:  Procedure Laterality Date  . APPENDECTOMY  1950  . CARDIAC CATHETERIZATION  05/29/2013   EF=40-45%. Moderate pulmonary hypertension. Infero/ lateral hypokinesis  . CORONARY ARTERY BYPASS GRAFT  2011   with 4 grafts  . MRI BRAIN  06/07/2013   Mild chronic involutional changes. No acute abnormalities  . Myocardial Perfusion Scan  05/29/2013   Abnormal myocardial perfusion image consistent with myocardial infarction  . PROSTATE SURGERY  11/18/2012    Needle biopsy; Adenocarcinoma of Prostate; Dr. Stoioff  . TOTAL HIP ARTHROPLASTY Left 1997   hip joint replacement    There were no vitals filed for this visit.      Subjective Assessment - 08/17/16 1434    Subjective Pt states that he has been performing his exercises at home. Patient states his pain has increased and his pain now stays for a longer period of time. He states he is not in pain but has numbness  in R jaw and neck.    Pertinent History Pt reports R sided neck pain which radiates to R jaw and down into R shoulder. This started approximately 2 months ago. He denies any injury. Pt states that he has been very short of breath recently. He has an appt with his cardiologist tomorrow. Pt has a history of CABG x 5. No history of similar pain in the past. Pt reports that the pain is intermittent and will last for 15 minutes at a time. He states that the pain worsens with straining when lifting something heavy. ROS negative for red flags.     Limitations Other (comment)  Denies limitation   Diagnostic tests Radiographs 03/2016: degenerative changes   Patient Stated Goals Decrease pain   Currently in Pain? No/denies   Pain Onset More than a month ago      Treatment: Manual Therapy (seated position): Soft tissue mobilization (incline position) to R upper quadrant ~8 minutes, tightness in upper traps and scalene muscles. Patient denies tenderness, but that doing this can help alleviate his pain. Reports increased numbness when performing soft tissue just lateral to R cervical spine. CPAs to C4-7, 10 sec x4, discontinued due to increased numbness (C4-C6). During treatment, patient complains of mild lightheadedness x2, BP: 118/54, HR: 73, SpO2: 94%. This subsides quickly.  Cervical AROM,   x2 each direction, relief with cervical flexion and moving towards left, increase in pain/numbness in opposite directions.  Manual distraction, 30 second holds x3, patient notes immediate relief, numbness begins after stopping distraction but to a lesser degree.  Thoracic PAs to T1-T6, 2x30 seconds each segment, patient is hypomobile and mildly kyphotic.  TherEx: Scapular retractions, x10, yellow tband resistance, min VCs to squeeze shoulder blades and decrease upper trap dominance, increase numbness Manual distraction used to relieve pain, x15 seconds Scapular low row, 2x10, yellow tband resistance, min VCs to  decrease range UEs moved through, no increase in numbness. Repeated cervical retraction, x5 Re-instructed in scalene stretch, min VCs to perform very light pressure with UE not resisted pressure. Seated passive scalene stretch, 3x30 second holds, denies pain Seated thoracic extension over towel roll, min VCs to perform at thoracic level not to include UEs or cervical in the movement.   Instructed on appropriate posture and head positioning to decrease numbness. After this instruction, patient had decreased numbness that did not return.                               PT Education - 08/17/16 1526    Education provided Yes   Education Details HEP (stretching), maintaining posture, breaks from the car during drive   Person(s) Educated Patient   Methods Explanation;Demonstration;Verbal cues   Comprehension Verbalized understanding;Returned demonstration;Verbal cues required             PT Long Term Goals - 08/09/16 1549      PT LONG TERM GOAL #1   Title Pt will be independent with HEP in order to decrease neck pain and improve function   Time 8   Period Weeks   Status New     PT LONG TERM GOAL #2   Title Pt will decrease NDI by at least 19% in order to demonstrate clinically significant reduction in pain and disability   Baseline 08/08/16: 44%   Time 8   Period Weeks   Status New     PT LONG TERM GOAL #3   Title Pt will improve cervical rotation by at least 5 degrees bilaterally in order to improve ability to look over his shoulder.    Baseline 08/08/16: R: 50 degrees, L: 47   Time 8   Period Weeks   Status New               Plan - 08/17/16 1527    Clinical Impression Statement Patient notes he has increased numbness and tingling that lasts for longer periods of time. With Grade I PAs to central (C5-C7) patient has increase in numbness so discontinued this session. Also has increased numbness with first rib mobility, discontinued. Patient  tolerate thoracic PAs and numbness resolved with instruction on posture. Patient would continue to benefit from skilled PT in order to address numbness, pain, and decreased range of motion.    Rehab Potential Good   Clinical Impairments Affecting Rehab Potential Positive: motivation; Negative: age   PT Frequency 2x / week   PT Duration 6 weeks   PT Treatment/Interventions Cryotherapy;Electrical Stimulation;Iontophoresis 4mg/ml Dexamethasone;Moist Heat;Traction;Gait training;Therapeutic activities;Therapeutic exercise;Balance training;Neuromuscular re-education;Patient/family education;Manual techniques;Passive range of motion;Dry needling   PT Next Visit Plan STM and cervical mobilizations, 1st rib mobilizations, manual distraction, MET, pt able to lie supine for short period of time (5-10 mins).   PT Home Exercise Plan repeated cervical retractions, scapular retractions, scalene stretch; Hand written instructions   on HEP handout to cue pt to relax shoulders with scapular retraction exercise as practiced this session.   Consulted and Agree with Plan of Care Patient      Patient will benefit from skilled therapeutic intervention in order to improve the following deficits and impairments:  Pain, Decreased range of motion  Visit Diagnosis: Cervicalgia  Abnormal posture     Problem List Patient Active Problem List   Diagnosis Date Noted  . Cervical neck pain with evidence of disc disease 07/04/2016  . PVC (premature ventricular contraction) 11/16/2015  . Abdominal pain 07/27/2015  . Adenocarcinoma of prostate ([place]) 07/27/2015  . Arthritis 07/27/2015  . Blood in feces 07/27/2015  . BPH (benign prostatic hyperplasia) 07/27/2015  . COPD (chronic obstructive pulmonary disease) ([place]) 07/27/2015  . GERD (gastroesophageal reflux disease) 07/27/2015  . Gout 07/27/2015  . Hyperglycemia 07/27/2015  . Hypotension 07/27/2015  . Insomnia 07/27/2015  . Shortness of breath 07/27/2015  . TIA  (transient ischemic attack) 07/27/2015  . Atrial fibrillation ([place]) 11/16/2014  . BP (high blood pressure) 10/13/2014  . Urinary hesitancy 09/02/2012  . ED (erectile dysfunction) of organic origin 06/10/2010  . CAD (coronary artery disease) 03/14/2010  . LBP (low back pain) 03/01/2009  . Allergic rhinitis 09/28/2008  . Essential (primary) hypertension 08/02/2007  . Hyperlipidemia 08/02/2007  . Acquired spondylolisthesis 03/20/2005  . Diverticulosis of colon without hemorrhage 12/18/2000  . Arthropathy 12/18/1998   Tilman Neat, SPT This entire session was performed under direct supervision and direction of a licensed therapist/therapist assistant . I have personally read, edited and approve of the note as written.  Trotter,Margaret PT, DPT 08/17/2016, 5:35 PM  [place] MAIN [facility] SERVICES [address] [place], [place], [address] Phone: [phone]   Fax:  [phone]  Name: James ROYE Sr. MRN: [id number] Date of Birth: [birth year]

## 2016-08-23 ENCOUNTER — Other Ambulatory Visit: Payer: Self-pay | Admitting: Family Medicine

## 2016-08-23 MED ORDER — AZELASTINE HCL 0.05 % OP SOLN: 1.0000 [drp] | Freq: Two times a day (BID) | OPHTHALMIC | 5 refills | Status: DC

## 2016-08-23 NOTE — Telephone Encounter (Signed)
LMOVM for pt to return call. Rx was sent to mail order 08/16/2016.

## 2016-08-23 NOTE — Telephone Encounter (Signed)
Patient called saying he ask for a refill on his metoprolol last week and has not heard anything.  He said if it has not been processed please send refill to [facility].  Thanks, [employer]

## 2016-08-24 ENCOUNTER — Encounter: Payer: Self-pay | Admitting: Physical Therapy

## 2016-08-24 ENCOUNTER — Ambulatory Visit: Payer: Medicare Other | Attending: Family Medicine | Admitting: Physical Therapy

## 2016-08-24 ENCOUNTER — Other Ambulatory Visit: Payer: Self-pay | Admitting: Family Medicine

## 2016-08-24 VITALS — BP 138/76 | HR 64

## 2016-08-24 DIAGNOSIS — M542 Cervicalgia: Secondary | ICD-10-CM | POA: Diagnosis not present

## 2016-08-24 DIAGNOSIS — R293 Abnormal posture: Secondary | ICD-10-CM | POA: Diagnosis not present

## 2016-08-24 MED ORDER — METOPROLOL TARTRATE 50 MG PO TABS: 50.0000 mg | ORAL_TABLET | Freq: Two times a day (BID) | ORAL | 4 refills | Status: DC

## 2016-08-24 NOTE — Therapy (Signed)
[place] MAIN [facility] SERVICES [address] [place], [place], [address] Phone: [phone]   Fax:  [phone]  Physical Therapy Treatment  Patient Details  Name: James VOLLER Sr. MRN: [id number] Date of Birth: 1929/11/17 Referring Provider: Lelon Huh  Encounter Date: 08/24/2016      PT End of Session - 08/24/16 1514    Visit Number 5   Number of Visits 13   Date for PT Re-Evaluation Oct 06, 2016   Authorization Type g codes 5/10   PT Start Time 8466   PT Stop Time 1513   PT Time Calculation (min) 45 min   Activity Tolerance Patient tolerated treatment well;No increased pain   Behavior During Therapy WFL for tasks assessed/performed      Past Medical History:  Diagnosis Date  . Back pain   . CAD (coronary artery disease)   . Diverticulosis   . GERD (gastroesophageal reflux disease)   . History of chicken pox   . History of measles   . History of mumps   . Hyperglycemia   . Hyperlipidemia   . Hypertension   . Hypotension   . Prostate cancer ([place])   . TIA (transient ischemic attack)     Past Surgical History:  Procedure Laterality Date  . APPENDECTOMY  1950  . CARDIAC CATHETERIZATION  05/29/2013   EF=40-45%. Moderate pulmonary hypertension. Infero/ lateral hypokinesis  . CORONARY ARTERY BYPASS GRAFT  2011   with 4 grafts  . MRI BRAIN  06/07/2013   Mild chronic involutional changes. No acute abnormalities  . Myocardial Perfusion Scan  05/29/2013   Abnormal myocardial perfusion image consistent with myocardial infarction  . PROSTATE SURGERY  11/18/2012    Needle biopsy; Adenocarcinoma of Prostate; Dr. Bernardo Heater  . TOTAL HIP ARTHROPLASTY Left 1997   hip joint replacement    Vitals:   08/24/16 1431  BP: 138/76  Pulse: 64  SpO2: 99%        Subjective Assessment - 08/24/16 1431    Subjective Pt states he has been peforming HEP and says today is a good day with his pain only 3/10 R neck.  "My neck feels the best it has  felt in a long time".  Used Blue Emu this morning and says he is using this 1-2x/day when he has numbness or pain. Says this reduces his pain does not eliminate it.  Drove to and from [place] since last session and pt reports pain with cervical rotation to switch lanes.  Reports new onset of R hip pain which he thinks may be associated with his yardwork, first noticed it when he woke up this morning.  Pt reports h/o Rt hip arthritis (h/o L hip replacement).   Pertinent History Pt reports R sided neck pain which radiates to R jaw and down into R shoulder. This started approximately 2 months ago. He denies any injury. Pt states that he has been very short of breath recently. He has an appt with his cardiologist tomorrow. Pt has a history of CABG x 5. No history of similar pain in the past. Pt reports that the pain is intermittent and will last for 15 minutes at a time. He states that the pain worsens with straining when lifting something heavy. ROS negative for red flags.     Limitations Other (comment)  Denies limitation   Diagnostic tests Radiographs 03/2016: degenerative changes   Patient Stated Goals Decrease pain   Currently in Pain? Yes   Pain Score 3  Pain Location Neck   Pain Orientation Right   Pain Descriptors / Indicators Sharp   Pain Type Chronic pain   Pain Onset More than a month ago   Multiple Pain Sites Yes   Pain Score 4   Pain Location Hip   Pain Orientation Right   Pain Descriptors / Indicators Aching   Pain Type Acute pain   Pain Onset Today       Treatment:   Manual Therapy (seated position):  Manual distraction, 30 second holds x3, reports less pain (2/10) following  Thoracic PAs grades II-III to T1-T6, 2x30 seconds each segment, patient is hypomobile and mildly kyphotic.  R 1st rib mobilization, grade III 2x30 seconds  R cervical rotation 2x10, MWM to aide with rotation of cervical spine, pt reports 0/10 pain with this but reports numbness for ~10 seconds  following. Cues for correct posture prior to exercise.   TherEx:  Cervical retraction 2x10 with demonstration and cues for technique  Repeated thoracic extension over  foam roll 2x10  Scapular retractions, 2x10, yellow tband resistance, min VCs to squeeze shoulder blades and decrease upper trap dominance, pt stood in front of mirror for feedback  Scapular low row, 2x10, yellow tband resistance, pt stood in front of mirror for feedback  Seated passive scalene stretch, 3x30 second holds         PT Education - 08/24/16 1435    Education provided Yes   Education Details Posture, exercise technique   Person(s) Educated Patient   Methods Explanation;Demonstration;Verbal cues;Tactile cues   Comprehension Verbalized understanding;Returned demonstration             PT Long Term Goals - 08/09/16 1549      PT LONG TERM GOAL #1   Title Pt will be independent with HEP in order to decrease neck pain and improve function   Time 8   Period Weeks   Status New     PT LONG TERM GOAL #2   Title Pt will decrease NDI by at least 19% in order to demonstrate clinically significant reduction in pain and disability   Baseline 08/08/16: 44%   Time 8   Period Weeks   Status New     PT LONG TERM GOAL #3   Title Pt will improve cervical rotation by at least 5 degrees bilaterally in order to improve ability to look over his shoulder.    Baseline 08/08/16: R: 50 degrees, L: 47   Time 8   Period Weeks   Status New               Plan - 08/24/16 1515    Clinical Impression Statement Today was a good day with less pain in R neck (3/10) with R rotation AROM.  Pt denied pain with MWM into R cervical rotation.  Pt is beginning to self correct posture with min verbal cues, specifically for scapular/shoulder depression.     Rehab Potential Good   Clinical Impairments Affecting Rehab Potential Positive: motivation; Negative: age   PT Frequency 2x / week   PT Duration 6 weeks   PT  Treatment/Interventions Cryotherapy;Electrical Stimulation;Iontophoresis 48m/ml Dexamethasone;Moist Heat;Traction;Gait training;Therapeutic activities;Therapeutic exercise;Balance training;Neuromuscular re-education;Patient/family education;Manual techniques;Passive range of motion;Dry needling   PT Next Visit Plan STM and cervical mobilizations, 1st rib mobilizations, manual distraction, MET, cervical rotation MWM   PT Home Exercise Plan repeated cervical retractions, scapular retractions, scalene stretch; Hand written instructions on HEP handout to cue pt to relax shoulders with scapular retraction exercise as practiced this  session.   Consulted and Agree with Plan of Care Patient      Patient will benefit from skilled therapeutic intervention in order to improve the following deficits and impairments:  Pain, Decreased range of motion  Visit Diagnosis: Cervicalgia  Abnormal posture     Problem List Patient Active Problem List   Diagnosis Date Noted  . Cervical neck pain with evidence of disc disease 07/04/2016  . PVC (premature ventricular contraction) 11/16/2015  . Abdominal pain 07/27/2015  . Adenocarcinoma of prostate ([place]) 07/27/2015  . Arthritis 07/27/2015  . Blood in feces 07/27/2015  . BPH (benign prostatic hyperplasia) 07/27/2015  . COPD (chronic obstructive pulmonary disease) ([place]) 07/27/2015  . GERD (gastroesophageal reflux disease) 07/27/2015  . Gout 07/27/2015  . Hyperglycemia 07/27/2015  . Hypotension 07/27/2015  . Insomnia 07/27/2015  . Shortness of breath 07/27/2015  . TIA (transient ischemic attack) 07/27/2015  . Atrial fibrillation ([place]) 11/16/2014  . BP (high blood pressure) 10/13/2014  . Urinary hesitancy 09/02/2012  . ED (erectile dysfunction) of organic origin 06/10/2010  . CAD (coronary artery disease) 03/14/2010  . LBP (low back pain) 03/01/2009  . Allergic rhinitis 09/28/2008  . Essential (primary) hypertension 08/02/2007  . Hyperlipidemia  08/02/2007  . Acquired spondylolisthesis 03/20/2005  . Diverticulosis of colon without hemorrhage 12/18/2000  . Arthropathy 12/18/1998    Collie Siad PT, DPT 08/24/2016, 3:18 PM  [place] MAIN [facility] SERVICES [address] [place], [place], [address] Phone: [phone]   Fax:  [phone]  Name: James FRICKER Sr. MRN: [id number] Date of Birth: 1929/07/27

## 2016-08-24 NOTE — Telephone Encounter (Signed)
Pt stated that mail order pharmacy still hasn't sent his metoprolol (LOPRESSOR) 50 MG tablet and he would like the RX for the medication to be sent to [place] b/c he no longer wishes to deal with the mail order. Please advise. Thanks TNP

## 2016-08-25 ENCOUNTER — Other Ambulatory Visit: Payer: Self-pay | Admitting: Family Medicine

## 2016-08-25 MED ORDER — METOPROLOL TARTRATE 50 MG PO TABS: 50.0000 mg | ORAL_TABLET | Freq: Two times a day (BID) | ORAL | 4 refills | Status: DC

## 2016-08-25 NOTE — Telephone Encounter (Signed)
LOV 07/04/2016. Renaldo Fiddler, CMA

## 2016-08-25 NOTE — Telephone Encounter (Signed)
Pt needs a refill for his metoprolol 50 mg sent to [facility].  He is completely out.  Thanks, [employer]

## 2016-08-29 ENCOUNTER — Ambulatory Visit: Payer: Medicare Other | Admitting: Physical Therapy

## 2016-08-29 ENCOUNTER — Encounter: Payer: Self-pay | Admitting: Physical Therapy

## 2016-08-29 VITALS — BP 124/58 | HR 83

## 2016-08-29 DIAGNOSIS — R293 Abnormal posture: Secondary | ICD-10-CM

## 2016-08-29 DIAGNOSIS — M542 Cervicalgia: Secondary | ICD-10-CM | POA: Diagnosis not present

## 2016-08-29 NOTE — Therapy (Signed)
[place] MAIN [facility] SERVICES [address] [place], [place], [address] Phone: [phone]   Fax:  [phone]  Physical Therapy Treatment  Patient Details  Name: James WARGA Sr. MRN: [id number] Date of Birth: 1929-04-26 Referring Provider: Lelon Huh  Encounter Date: 08/29/2016      PT End of Session - 08/29/16 1513    Visit Number 6   Number of Visits 13   Date for PT Re-Evaluation 2016-10-15   Authorization Type g codes 6/10   PT Start Time 1427   PT Stop Time 1512   PT Time Calculation (min) 45 min   Activity Tolerance Patient tolerated treatment well;No increased pain   Behavior During Therapy WFL for tasks assessed/performed      Past Medical History:  Diagnosis Date  . Back pain   . CAD (coronary artery disease)   . Diverticulosis   . GERD (gastroesophageal reflux disease)   . History of chicken pox   . History of measles   . History of mumps   . Hyperglycemia   . Hyperlipidemia   . Hypertension   . Hypotension   . Prostate cancer ([place])   . TIA (transient ischemic attack)     Past Surgical History:  Procedure Laterality Date  . APPENDECTOMY  1950  . CARDIAC CATHETERIZATION  05/29/2013   EF=40-45%. Moderate pulmonary hypertension. Infero/ lateral hypokinesis  . CORONARY ARTERY BYPASS GRAFT  2011   with 4 grafts  . MRI BRAIN  06/07/2013   Mild chronic involutional changes. No acute abnormalities  . Myocardial Perfusion Scan  05/29/2013   Abnormal myocardial perfusion image consistent with myocardial infarction  . PROSTATE SURGERY  11/18/2012    Needle biopsy; Adenocarcinoma of Prostate; Dr. Bernardo Heater  . TOTAL HIP ARTHROPLASTY Left 1997   hip joint replacement    Vitals:   08/29/16 1427  BP: (!) 124/58  Pulse: 83  SpO2: 96%        Subjective Assessment - 08/29/16 1427    Subjective Pt reports that after he performs his HEP his pain decreases.  Only has pain when turning his head to the R or into E.  Is  not noticing this pain as much when he is driving.  Occasional N/T remains over R neck.   Reports muscles spasms on R neck and typically applies Biofreeze which relieves this spasm.  Has been completing HEP every day.  Reports his R hip is getting better but still causes him pain at times.     Pertinent History Pt reports R sided neck pain which radiates to R jaw and down into R shoulder. This started approximately 2 months ago. He denies any injury. Pt states that he has been very short of breath recently. He has an appt with his cardiologist tomorrow. Pt has a history of CABG x 5. No history of similar pain in the past. Pt reports that the pain is intermittent and will last for 15 minutes at a time. He states that the pain worsens with straining when lifting something heavy. ROS negative for red flags.     Limitations Other (comment)  Denies limitation   Diagnostic tests Radiographs 03/2016: degenerative changes   Patient Stated Goals Decrease pain   Currently in Pain? Yes   Pain Score 4   when rotating to the R   Pain Location Neck   Pain Orientation Right   Pain Descriptors / Indicators Aching   Pain Onset More than a month ago  TREATMENT  R Cervical AROM rotation prior to manual therapy: 47 deg  Manual Therapy (seated position):  Manual distraction in sitting, 45 second holds x3  MWM cervical PA mob as pt extends neck. Prior to this treatment pain 4/10 with neck E. During this treatment pain 0/10 with neck E. After this treatment pain 3-4/10 with neck E.  Thoracic PAs grades II-III to T1-T6, 2x30 seconds each segment, patient is hypomobile and mildly kyphotic.  R 1st rib mobilization, grade III 2x30 seconds  R cervical rotation 3x10, MWM to aide with rotation of cervical spine, pt denies tingling or pain. Cues to relax shoulders during exercise.  R Cervical AROM rotation after manual therapy: 61 deg   TherEx:  Cervical retraction 2x10 with 3 second holds and with demonstration  and cues for technique  Repeated thoracic extension over  foam roll 2x10  Seated passive scalene stretch, 3x30 second holds  Scapular retractions, 2x10, yellow tband resistance, min VCs to squeeze shoulder blades and decrease upper trap dominance, pt stood in front of mirror for feedback  Scapular low row, 2x10, yellow tband resistance, pt stood in front of mirror for feedback           PT Education - 08/29/16 1433    Education Details Exercise technique; Cues for posture; provided pt with YTB to perform scapular retraction exercise with   Person(s) Educated Patient   Methods Explanation;Tactile cues;Verbal cues;Demonstration   Comprehension Verbalized understanding;Returned demonstration             PT Long Term Goals - 08/09/16 1549      PT LONG TERM GOAL #1   Title Pt will be independent with HEP in order to decrease neck pain and improve function   Time 8   Period Weeks   Status New     PT LONG TERM GOAL #2   Title Pt will decrease NDI by at least 19% in order to demonstrate clinically significant reduction in pain and disability   Baseline 08/08/16: 44%   Time 8   Period Weeks   Status New     PT LONG TERM GOAL #3   Title Pt will improve cervical rotation by at least 5 degrees bilaterally in order to improve ability to look over his shoulder.    Baseline 08/08/16: R: 50 degrees, L: 47   Time 8   Period Weeks   Status New               Plan - 08/29/16 1513    Clinical Impression Statement Pt demonstrated significant improvement in R cervical AROM following manual techniques.  Additionally pt denies pain with cervical extension with MWM exercise today.  Pt reports an overall decrease in intensity and frequency of symptoms and will benefit from skilled PT services to further address impairments and improve QOL.   Rehab Potential Good   Clinical Impairments Affecting Rehab Potential Positive: motivation; Negative: age   PT Frequency 2x / week   PT Duration  6 weeks   PT Treatment/Interventions Cryotherapy;Electrical Stimulation;Iontophoresis 65m/ml Dexamethasone;Moist Heat;Traction;Gait training;Therapeutic activities;Therapeutic exercise;Balance training;Neuromuscular re-education;Patient/family education;Manual techniques;Passive range of motion;Dry needling   PT Next Visit Plan STM and cervical mobilizations, 1st rib mobilizations, manual distraction, MET, cervical rotation and extension MWM   PT Home Exercise Plan repeated cervical retractions, scapular retractions, scalene stretch; Hand written instructions on HEP handout to cue pt to relax shoulders with scapular retraction exercise as practiced this session.   Consulted and Agree with Plan of Care Patient  Patient will benefit from skilled therapeutic intervention in order to improve the following deficits and impairments:  Pain, Decreased range of motion  Visit Diagnosis: Cervicalgia  Abnormal posture     Problem List Patient Active Problem List   Diagnosis Date Noted  . Cervical neck pain with evidence of disc disease 07/04/2016  . PVC (premature ventricular contraction) 11/16/2015  . Abdominal pain 07/27/2015  . Adenocarcinoma of prostate ([place]) 07/27/2015  . Arthritis 07/27/2015  . Blood in feces 07/27/2015  . BPH (benign prostatic hyperplasia) 07/27/2015  . COPD (chronic obstructive pulmonary disease) ([place]) 07/27/2015  . GERD (gastroesophageal reflux disease) 07/27/2015  . Gout 07/27/2015  . Hyperglycemia 07/27/2015  . Hypotension 07/27/2015  . Insomnia 07/27/2015  . Shortness of breath 07/27/2015  . TIA (transient ischemic attack) 07/27/2015  . Atrial fibrillation ([place]) 11/16/2014  . BP (high blood pressure) 10/13/2014  . Urinary hesitancy 09/02/2012  . ED (erectile dysfunction) of organic origin 06/10/2010  . CAD (coronary artery disease) 03/14/2010  . LBP (low back pain) 03/01/2009  . Allergic rhinitis 09/28/2008  . Essential (primary) hypertension 08/02/2007   . Hyperlipidemia 08/02/2007  . Acquired spondylolisthesis 03/20/2005  . Diverticulosis of colon without hemorrhage 12/18/2000  . Arthropathy 12/18/1998    Collie Siad PT, DPT 08/29/2016, 3:16 PM  [place] MAIN [facility] SERVICES [address] [place], [place], [address] Phone: [phone]   Fax:  [phone]  Name: James GUANDIQUE Sr. MRN: [id number] Date of Birth: 30-May-1929

## 2016-08-31 ENCOUNTER — Encounter: Payer: Self-pay | Admitting: Physical Therapy

## 2016-08-31 ENCOUNTER — Ambulatory Visit: Payer: Medicare Other | Admitting: Physical Therapy

## 2016-08-31 VITALS — BP 136/68 | HR 77

## 2016-08-31 DIAGNOSIS — M542 Cervicalgia: Secondary | ICD-10-CM

## 2016-08-31 DIAGNOSIS — R293 Abnormal posture: Secondary | ICD-10-CM

## 2016-08-31 NOTE — Therapy (Signed)
[place] MAIN [facility] SERVICES [address] [place], [place], [address] Phone: [phone]   Fax:  [phone]  Physical Therapy Treatment  Patient Details  Name: James KAUS Sr. MRN: [id number] Date of Birth: May 20, 1929 Referring Provider: Lelon Huh  Encounter Date: 08/31/2016      PT End of Session - 08/31/16 1515    Visit Number 7   Number of Visits 13   Date for PT Re-Evaluation 2016-10-13   Authorization Type g codes 7/10   PT Start Time 1426   PT Stop Time 1515   PT Time Calculation (min) 49 min   Activity Tolerance Patient tolerated treatment well;No increased pain   Behavior During Therapy WFL for tasks assessed/performed      Past Medical History:  Diagnosis Date  . Back pain   . CAD (coronary artery disease)   . Diverticulosis   . GERD (gastroesophageal reflux disease)   . History of chicken pox   . History of measles   . History of mumps   . Hyperglycemia   . Hyperlipidemia   . Hypertension   . Hypotension   . Prostate cancer ([place])   . TIA (transient ischemic attack)     Past Surgical History:  Procedure Laterality Date  . APPENDECTOMY  1950  . CARDIAC CATHETERIZATION  05/29/2013   EF=40-45%. Moderate pulmonary hypertension. Infero/ lateral hypokinesis  . CORONARY ARTERY BYPASS GRAFT  2011   with 4 grafts  . MRI BRAIN  06/07/2013   Mild chronic involutional changes. No acute abnormalities  . Myocardial Perfusion Scan  05/29/2013   Abnormal myocardial perfusion image consistent with myocardial infarction  . PROSTATE SURGERY  11/18/2012    Needle biopsy; Adenocarcinoma of Prostate; Dr. Bernardo Heater  . TOTAL HIP ARTHROPLASTY Left 1997   hip joint replacement    Vitals:   08/31/16 1425  BP: 136/68  Pulse: 77  SpO2: 94%        Subjective Assessment - 08/31/16 1426    Subjective Pt reports he had more neck pain and tingling than usual.  Says he has not been sleeping well.  Is not having as much pain with  R cervical rotation, most pain now is with cervical extension.  Says he does not have pain when turning his head while driving.  Has been doing repeated extension at home (not in HEP) with pain, instructed pt ot d/c this.  Is no longer having R hip pain.     Pertinent History Pt reports R sided neck pain which radiates to R jaw and down into R shoulder. This started approximately 2 months ago. He denies any injury. Pt states that he has been very short of breath recently. He has an appt with his cardiologist tomorrow. Pt has a history of CABG x 5. No history of similar pain in the past. Pt reports that the pain is intermittent and will last for 15 minutes at a time. He states that the pain worsens with straining when lifting something heavy. ROS negative for red flags.     Limitations Other (comment)  Denies limitation   Diagnostic tests Radiographs 03/2016: degenerative changes   Patient Stated Goals Decrease pain   Currently in Pain? Yes   Pain Score 6   with cervical extension   Pain Location Neck   Pain Orientation Right   Pain Descriptors / Indicators Sharp   Pain Type Chronic pain   Pain Onset More than a month ago   Pain Relieving Factors  avoiding cervical extension and R rotation      TREATMENT Cervical Extension AROM prior to manual therapy: 34 deg, 4/10 pain   Manual Therapy (seated position):  Manual distraction in sitting, 45 second holds x3  MWM cervical PA mob as pt extends neck, 3x10. Prior to this treatment pain 4/10 with neck E. During this treatment pain 0/10 with neck E. After this treatment pain 2/10 with neck E and cues for correct posture.  Cervical and Thoracic PAs grades II-III to C4-T6, 2x30 seconds each segment, patient is hypomobile and mildly kyphotic.  UPAs on L cervical grade III 3x30 second bouts  R 1st rib mobilization, grade III 2x30 seconds  R cervical rotation 3x10, MWM to aide with rotation of cervical spine, pt denies tingling or pain and reports 0/10  with R cervical rotation following. Cues to relax shoulders during exercise.  Cervical Extension AROM after manual therapy: 40deg, 1/10 pain   TherEx:  Seated repeated thoracic extension over  foam roll 2x10 with 3 second holds  Seated passive scalene stretch, 3x30 second holds  Seated self MWM into R cervical rotation with towel, 2x10 with 3 seconds                  PT Education - 08/31/16 1429    Education provided Yes   Education Details To not do cervical extension at home; Exercise technique   Person(s) Educated Patient   Methods Explanation;Demonstration;Tactile cues;Verbal cues   Comprehension Verbalized understanding;Returned demonstration             PT Long Term Goals - 08/09/16 1549      PT LONG TERM GOAL #1   Title Pt will be independent with HEP in order to decrease neck pain and improve function   Time 8   Period Weeks   Status New     PT LONG TERM GOAL #2   Title Pt will decrease NDI by at least 19% in order to demonstrate clinically significant reduction in pain and disability   Baseline 08/08/16: 44%   Time 8   Period Weeks   Status New     PT LONG TERM GOAL #3   Title Pt will improve cervical rotation by at least 5 degrees bilaterally in order to improve ability to look over his shoulder.    Baseline 08/08/16: R: 50 degrees, L: 47   Time 8   Period Weeks   Status New               Plan - 08/31/16 1447    Clinical Impression Statement Pt continues to have pain that fluctuates day to day but reports overall decrease in pain.  He does not notice pain unless performing cervical extension or R rotation, both of which decrease in pain following mobilizations today.  Pt will benefit from skilled PT interventions to continue to decresae pain and improve rotation and extension.   Rehab Potential Good   Clinical Impairments Affecting Rehab Potential Positive: motivation; Negative: age   PT Frequency 2x / week   PT Duration 6 weeks   PT  Treatment/Interventions Cryotherapy;Electrical Stimulation;Iontophoresis 27m/ml Dexamethasone;Moist Heat;Traction;Gait training;Therapeutic activities;Therapeutic exercise;Balance training;Neuromuscular re-education;Patient/family education;Manual techniques;Passive range of motion;Dry needling   PT Next Visit Plan Self MWM using towel into R cervical retraction at start of session and add to HEP if appopriate.  STM and cervical mobilizations, 1st rib mobilizations, manual distraction, MET, cervical rotation and extension MWM   PT Home Exercise Plan repeated cervical retractions, scapular retractions, scalene  stretch; Hand written instructions on HEP handout to cue pt to relax shoulders with scapular retraction exercise as practiced this session.   Consulted and Agree with Plan of Care Patient      Patient will benefit from skilled therapeutic intervention in order to improve the following deficits and impairments:  Pain, Decreased range of motion  Visit Diagnosis: Cervicalgia  Abnormal posture     Problem List Patient Active Problem List   Diagnosis Date Noted  . Cervical neck pain with evidence of disc disease 07/04/2016  . PVC (premature ventricular contraction) 11/16/2015  . Abdominal pain 07/27/2015  . Adenocarcinoma of prostate ([place]) 07/27/2015  . Arthritis 07/27/2015  . Blood in feces 07/27/2015  . BPH (benign prostatic hyperplasia) 07/27/2015  . COPD (chronic obstructive pulmonary disease) ([place]) 07/27/2015  . GERD (gastroesophageal reflux disease) 07/27/2015  . Gout 07/27/2015  . Hyperglycemia 07/27/2015  . Hypotension 07/27/2015  . Insomnia 07/27/2015  . Shortness of breath 07/27/2015  . TIA (transient ischemic attack) 07/27/2015  . Atrial fibrillation ([place]) 11/16/2014  . BP (high blood pressure) 10/13/2014  . Urinary hesitancy 09/02/2012  . ED (erectile dysfunction) of organic origin 06/10/2010  . CAD (coronary artery disease) 03/14/2010  . LBP (low back pain)  03/01/2009  . Allergic rhinitis 09/28/2008  . Essential (primary) hypertension 08/02/2007  . Hyperlipidemia 08/02/2007  . Acquired spondylolisthesis 03/20/2005  . Diverticulosis of colon without hemorrhage 12/18/2000  . Arthropathy 12/18/1998    Collie Siad PT, DPT 08/31/2016, 3:17 PM  [place] MAIN [facility] SERVICES [address] [place], [place], [address] Phone: [phone]   Fax:  [phone]  Name: James ENCINAS Sr. MRN: [id number] Date of Birth: 05-18-1929

## 2016-09-05 ENCOUNTER — Encounter: Payer: Self-pay | Admitting: Physical Therapy

## 2016-09-05 ENCOUNTER — Ambulatory Visit: Payer: Medicare Other | Admitting: Physical Therapy

## 2016-09-05 VITALS — BP 126/104 | HR 79

## 2016-09-05 DIAGNOSIS — M542 Cervicalgia: Secondary | ICD-10-CM

## 2016-09-05 DIAGNOSIS — R293 Abnormal posture: Secondary | ICD-10-CM

## 2016-09-05 NOTE — Therapy (Signed)
[place] MAIN [facility] SERVICES [address] [place], [place], [address] Phone: [phone]   Fax:  [phone]  Physical Therapy Treatment  Patient Details  Name: James TAVELLA Sr. MRN: [id number] Date of Birth: [birth year] Referring Provider: Lelon Huh  Encounter Date: 09/05/2016      PT End of Session - 09/05/16 1531    Visit Number 8   Number of Visits 13   Date for PT Re-Evaluation Oct 13, 2016   Authorization Type g codes 8/10   PT Start Time 1324   PT Stop Time 4010   PT Time Calculation (min) 45 min   Activity Tolerance Patient tolerated treatment well;No increased pain   Behavior During Therapy WFL for tasks assessed/performed      Past Medical History:  Diagnosis Date  . Back pain   . CAD (coronary artery disease)   . Diverticulosis   . GERD (gastroesophageal reflux disease)   . History of chicken pox   . History of measles   . History of mumps   . Hyperglycemia   . Hyperlipidemia   . Hypertension   . Hypotension   . Prostate cancer ([place])   . TIA (transient ischemic attack)     Past Surgical History:  Procedure Laterality Date  . APPENDECTOMY  1950  . CARDIAC CATHETERIZATION  05/29/2013   EF=40-45%. Moderate pulmonary hypertension. Infero/ lateral hypokinesis  . CORONARY ARTERY BYPASS GRAFT  2011   with 4 grafts  . MRI BRAIN  06/07/2013   Mild chronic involutional changes. No acute abnormalities  . Myocardial Perfusion Scan  05/29/2013   Abnormal myocardial perfusion image consistent with myocardial infarction  . PROSTATE SURGERY  11/18/2012    Needle biopsy; Adenocarcinoma of Prostate; Dr. Bernardo Heater  . 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  Pt  does not notice pain unless performing cervical extension (6/10 pain).  Otherwise no other complaints or concerns.  Reports that he is very pleased with his progress and overall is experiencing less pain than when he began PT.   Pertinent History Pt reports R sided neck pain which radiates to R jaw and down into R shoulder. This started approximately 2 months ago. He denies any injury. Pt states that he has been very short of breath recently. He has an appt with his cardiologist tomorrow. Pt has a history of CABG x 5. No history of similar pain in the past. Pt reports that the pain is intermittent and will last for 15 minutes at a time. He states that the pain worsens with straining when lifting something heavy. ROS negative for red flags.     Limitations Other (comment)  Denies limitation   Diagnostic tests Radiographs 03/2016: degenerative changes   Patient Stated Goals Decrease pain   Currently in Pain? Yes   Pain Score 6   with cervical extension   Pain Location Neck   Pain Orientation Right   Pain Descriptors / Indicators Sore;Sharp   Pain Type Chronic pain   Pain Onset More than a month ago   Multiple Pain Sites No       TREATMENT:  Cervical Extension AROM prior  to manual therapy: 27 deg, 6/10 pain   Manual Therapy (seated position):  Manual distraction in sitting, 45 second holds x3  MWM cervical PA mob as pt extends neck, 3x10. Prior to this treatment pain 6/10 with neck E. After this treatment pain 4/10 with neck E and cues for correct posture.  Cervical and Thoracic PAs grades II-III to C4-T6, 2x30 seconds each segment, patient is hypomobile and mildly kyphotic.  UPAs to L cervical grade III 3x30 second bouts  R 1st rib mobilization, grade III 2x30 seconds  R cervical rotation 3x10, MWM to aide with rotation of cervical spine, pt denies tingling or pain during and reports 1/10 with R cervical rotation following. Cues to relax shoulders during exercise.  Cervical Extension AROM  after manual therapy: 35deg, 4/10 pain   Therapeutic Exercise:  Seated repeated thoracic extension over  foam roll 2x10 with 3 second holds  Seated repeated cervical retraction, 3x10 with 3 second holds  Seated passive scalene stretch, 2x30 second holds  Seated self MWM into R cervical rotation with towel, 2x10 with 3 second hold in painfree range (at start of session). 2/10 pain with active R cervical rotation at start of session. 1/10 pain after exercise.                    PT Education - 09/05/16 1454    Education provided Yes   Education Details Exercise technique   Person(s) Educated Patient   Methods Explanation;Demonstration;Verbal cues;Tactile cues   Comprehension Verbalized understanding;Returned demonstration             PT Long Term Goals - 08/09/16 1549      PT LONG TERM GOAL #1   Title Pt will be independent with HEP in order to decrease neck pain and improve function   Time 8   Period Weeks   Status New     PT LONG TERM GOAL #2   Title Pt will decrease NDI by at least 19% in order to demonstrate clinically significant reduction in pain and disability   Baseline 08/08/16: 44%   Time 8   Period Weeks   Status New     PT LONG TERM GOAL #3   Title Pt will improve cervical rotation by at least 5 degrees bilaterally in order to improve ability to look over his shoulder.    Baseline 08/08/16: R: 50 degrees, L: 47   Time 8   Period Weeks   Status New               Plan - 09/05/16 1531    Clinical Impression Statement Pt reports improvement in cervical extension pain following MWM but not completely resolved.  Provided pt with MWM HEP for R cervical rotation as he reported decreased pain with R cervical rotation following this exercise.  Pt tolerated all interventions well this date.  Pt will benefit from continued skilled PT for strenthening and ROM exercises to decrease pain and improve QOL.   Rehab Potential Good   Clinical Impairments  Affecting Rehab Potential Positive: motivation; Negative: age   PT Frequency 2x / week   PT Duration 6 weeks   PT Treatment/Interventions Cryotherapy;Electrical Stimulation;Iontophoresis 93m/ml Dexamethasone;Moist Heat;Traction;Gait training;Therapeutic activities;Therapeutic exercise;Balance training;Neuromuscular re-education;Patient/family education;Manual techniques;Passive range of motion;Dry needling   PT Next Visit Plan STM and cervical mobilizations, 1st rib mobilizations, manual distraction, MET, cervical rotation and extension MWM   PT Home Exercise Plan repeated cervical retractions, scapular retractions, scalene stretch; Hand written instructions on HEP handout  to cue pt to relax shoulders with scapular retraction exercise as practiced this session; MWM R cervical rotation with towel   Consulted and Agree with Plan of Care Patient      Patient will benefit from skilled therapeutic intervention in order to improve the following deficits and impairments:  Pain, Decreased range of motion  Visit Diagnosis: Cervicalgia  Abnormal posture     Problem List Patient Active Problem List   Diagnosis Date Noted  . Cervical neck pain with evidence of disc disease 07/04/2016  . PVC (premature ventricular contraction) 11/16/2015  . Abdominal pain 07/27/2015  . Adenocarcinoma of prostate ([place]) 07/27/2015  . Arthritis 07/27/2015  . Blood in feces 07/27/2015  . BPH (benign prostatic hyperplasia) 07/27/2015  . COPD (chronic obstructive pulmonary disease) ([place]) 07/27/2015  . GERD (gastroesophageal reflux disease) 07/27/2015  . Gout 07/27/2015  . Hyperglycemia 07/27/2015  . Hypotension 07/27/2015  . Insomnia 07/27/2015  . Shortness of breath 07/27/2015  . TIA (transient ischemic attack) 07/27/2015  . Atrial fibrillation ([place]) 11/16/2014  . BP (high blood pressure) 10/13/2014  . Urinary hesitancy 09/02/2012  . ED (erectile dysfunction) of organic origin 06/10/2010  . CAD (coronary  artery disease) 03/14/2010  . LBP (low back pain) 03/01/2009  . Allergic rhinitis 09/28/2008  . Essential (primary) hypertension 08/02/2007  . Hyperlipidemia 08/02/2007  . Acquired spondylolisthesis 03/20/2005  . Diverticulosis of colon without hemorrhage 12/18/2000  . Arthropathy 12/18/1998    Collie Siad PT, DPT 09/05/2016, 8:38 PM  [place] MAIN [facility] SERVICES [address] [place], [place], [address] Phone: [phone]   Fax:  [phone]  Name: James VANHOUTEN Sr. MRN: [id number] Date of Birth: 1929-05-24

## 2016-09-05 NOTE — Patient Instructions (Addendum)
Active Neck Rotation   Copyright  VHI. All rights reserved.  Flexion: Cervical Facet Joint - Upglide    Wrap towel behind neck. Rotate neck slightly by pulling other end of towel so that you rotate your neck to the right.  Do not push into pain, stop just before you feel pain and hold this position for 3 seconds. Repeat 10 times per set. Do 2 sets each day. Do 5 sessions per week.  Copyright  VHI. All rights reserved.

## 2016-09-07 ENCOUNTER — Encounter: Payer: Self-pay | Admitting: Physical Therapy

## 2016-09-07 ENCOUNTER — Ambulatory Visit: Payer: Medicare Other | Admitting: Physical Therapy

## 2016-09-07 VITALS — BP 126/70 | HR 72

## 2016-09-07 DIAGNOSIS — R293 Abnormal posture: Secondary | ICD-10-CM

## 2016-09-07 DIAGNOSIS — M542 Cervicalgia: Secondary | ICD-10-CM

## 2016-09-07 NOTE — Therapy (Signed)
[employer] MAIN [facility] SERVICES [address] [place], [place], [address] Phone: [phone]   Fax:  [phone]  Physical Therapy Treatment  Patient Details  Name: James GALLICCHIO Sr. MRN: [id number] Date of Birth: 1929/09/04 Referring Provider: Lelon Huh  Encounter Date: 09/07/2016      PT End of Session - 09/07/16 1529    Visit Number 9   Number of Visits 13   Date for PT Re-Evaluation 12-Oct-2016   Authorization Type g codes 2023-09-20   PT Start Time 9798   PT Stop Time 1528   PT Time Calculation (min) 43 min   Activity Tolerance Patient tolerated treatment well;No increased pain   Behavior During Therapy WFL for tasks assessed/performed      Past Medical History:  Diagnosis Date  . Back pain   . CAD (coronary artery disease)   . Diverticulosis   . GERD (gastroesophageal reflux disease)   . History of chicken pox   . History of measles   . History of mumps   . Hyperglycemia   . Hyperlipidemia   . Hypertension   . Hypotension   . Prostate cancer ([place])   . TIA (transient ischemic attack)     Past Surgical History:  Procedure Laterality Date  . APPENDECTOMY  1950  . CARDIAC CATHETERIZATION  05/29/2013   EF=40-45%. Moderate pulmonary hypertension. Infero/ lateral hypokinesis  . CORONARY ARTERY BYPASS GRAFT  2011   with 4 grafts  . MRI BRAIN  06/07/2013   Mild chronic involutional changes. No acute abnormalities  . Myocardial Perfusion Scan  05/29/2013   Abnormal myocardial perfusion image consistent with myocardial infarction  . PROSTATE SURGERY  11/18/2012    Needle biopsy; Adenocarcinoma of Prostate; Dr. Bernardo Heater  . TOTAL HIP ARTHROPLASTY Left 1997   hip joint replacement    Vitals:   09/07/16 1451  BP: 126/70  Pulse: 72  SpO2: 95%        Subjective Assessment - 09/07/16 1451    Subjective Pt reports that the more he moves his neck and completes his HEP his pain with cervical rotation improves.  Reports he had some  tingling and pain in his neck while sleeping last night.  Pt reports he is unsure if his pillow is too flat or too fluffy.   Pertinent History Pt reports R sided neck pain which radiates to R jaw and down into R shoulder. This started approximately 2 months ago. He denies any injury. Pt states that he has been very short of breath recently. He has an appt with his cardiologist tomorrow. Pt has a history of CABG x 5. No history of similar pain in the past. Pt reports that the pain is intermittent and will last for 15 minutes at a time. He states that the pain worsens with straining when lifting something heavy. ROS negative for red flags.     Limitations Other (comment)  Denies limitation   Diagnostic tests Radiographs 03/2016: degenerative changes   Patient Stated Goals Decrease pain   Currently in Pain? Yes  with active cervical extension   Pain Score 8    Pain Location Neck   Pain Orientation Right   Pain Descriptors / Indicators Sharp   Pain Type Chronic pain   Pain Onset More than a month ago       TREATMENT:  Cervical Extension AROM prior to manual therapy: 8/10 pain   Manual Therapy (seated position):  Manual distraction in sitting, 45 second holds x3  MWM cervical PA mob as pt extends neck, 3x10.  Cervical and Thoracic PAs grades II-III to C4-T6, 2x30 seconds each segment, patient is hypomobile and mildly kyphotic.  UPAs to L cervical grade III 3x30 second bouts  R 1st rib mobilization, grade III 2x30 seconds  R cervical rotation MWM, 3x10  Cervical extension MWM, 3x10  Cervical Extension AROM after manual therapy: 1/10 pain  STM to R upper trap using Free-Up   Therapeutic Exercise:  Seated passive scalene stretch, 2x30 second holds  Seated passive upper trap stretch, 2x30 second holds. Pt reports relief following.                    PT Education - 09/07/16 1453    Education provided Yes   Education Details Exercise technique; cues for posture; demonstration  of proper pillow height in sidelying   Person(s) Educated Patient   Methods Explanation;Demonstration;Tactile cues;Verbal cues   Comprehension Verbalized understanding;Returned demonstration             PT Long Term Goals - 08/09/16 1549      PT LONG TERM GOAL #1   Title Pt will be independent with HEP in order to decrease neck pain and improve function   Time 8   Period Weeks   Status New     PT LONG TERM GOAL #2   Title Pt will decrease NDI by at least 19% in order to demonstrate clinically significant reduction in pain and disability   Baseline 08/08/16: 44%   Time 8   Period Weeks   Status New     PT LONG TERM GOAL #3   Title Pt will improve cervical rotation by at least 5 degrees bilaterally in order to improve ability to look over his shoulder.    Baseline 08/08/16: R: 50 degrees, L: 47   Time 8   Period Weeks   Status New               Plan - 09/07/16 1457    Clinical Impression Statement Discussed proper pillow height in sidelying (as pt does at home) and to have his wife check orientation of neck.  Pt reports 1/10 pain with cervical extension AROM at end of session compared to 8/10 pain at start of session.  STM to R upper trap this sesison which pt tolerated well.  Pt will benefit from skilled PT services to decrease pain.   Rehab Potential Good   Clinical Impairments Affecting Rehab Potential Positive: motivation; Negative: age   PT Frequency 2x / week   PT Duration 6 weeks   PT Treatment/Interventions Cryotherapy;Electrical Stimulation;Iontophoresis 87m/ml Dexamethasone;Moist Heat;Traction;Gait training;Therapeutic activities;Therapeutic exercise;Balance training;Neuromuscular re-education;Patient/family education;Manual techniques;Passive range of motion;Dry needling   PT Next Visit Plan G-codes; STM and cervical mobilizations, 1st rib mobilizations, manual distraction, MET, cervical rotation and extension MWM   PT Home Exercise Plan repeated cervical  retractions, scapular retractions, scalene stretch; Hand written instructions on HEP handout to cue pt to relax shoulders with scapular retraction exercise as practiced this session; MWM R cervical rotation with towel   Consulted and Agree with Plan of Care Patient      Patient will benefit from skilled therapeutic intervention in order to improve the following deficits and impairments:  Pain, Decreased range of motion  Visit Diagnosis: Cervicalgia  Abnormal posture     Problem List Patient Active Problem List   Diagnosis Date Noted  . Cervical neck pain with evidence of disc disease 07/04/2016  . PVC (premature ventricular  contraction) 11/16/2015  . Abdominal pain 07/27/2015  . Adenocarcinoma of prostate ([place]) 07/27/2015  . Arthritis 07/27/2015  . Blood in feces 07/27/2015  . BPH (benign prostatic hyperplasia) 07/27/2015  . COPD (chronic obstructive pulmonary disease) ([place]) 07/27/2015  . GERD (gastroesophageal reflux disease) 07/27/2015  . Gout 07/27/2015  . Hyperglycemia 07/27/2015  . Hypotension 07/27/2015  . Insomnia 07/27/2015  . Shortness of breath 07/27/2015  . TIA (transient ischemic attack) 07/27/2015  . Atrial fibrillation ([place]) 11/16/2014  . BP (high blood pressure) 10/13/2014  . Urinary hesitancy 09/02/2012  . ED (erectile dysfunction) of organic origin 06/10/2010  . CAD (coronary artery disease) 03/14/2010  . LBP (low back pain) 03/01/2009  . Allergic rhinitis 09/28/2008  . Essential (primary) hypertension 08/02/2007  . Hyperlipidemia 08/02/2007  . Acquired spondylolisthesis 03/20/2005  . Diverticulosis of colon without hemorrhage 12/18/2000  . Arthropathy 12/18/1998    Collie Siad PT, DPT 09/07/2016, 3:31 PM  [place] MAIN [facility] SERVICES [address] [place], [place], [address] Phone: [phone]   Fax:  [phone]  Name: James ROUSSEAU Sr. MRN: [id number] Date of Birth: 07/05/1929

## 2016-09-11 DIAGNOSIS — R0609 Other forms of dyspnea: Secondary | ICD-10-CM | POA: Diagnosis not present

## 2016-09-11 DIAGNOSIS — R0602 Shortness of breath: Secondary | ICD-10-CM | POA: Diagnosis not present

## 2016-09-13 ENCOUNTER — Ambulatory Visit: Payer: Medicare Other

## 2016-09-14 DIAGNOSIS — I480 Paroxysmal atrial fibrillation: Secondary | ICD-10-CM | POA: Diagnosis not present

## 2016-09-14 DIAGNOSIS — E782 Mixed hyperlipidemia: Secondary | ICD-10-CM | POA: Diagnosis not present

## 2016-09-14 DIAGNOSIS — I10 Essential (primary) hypertension: Secondary | ICD-10-CM | POA: Diagnosis not present

## 2016-09-14 DIAGNOSIS — I2510 Atherosclerotic heart disease of native coronary artery without angina pectoris: Secondary | ICD-10-CM | POA: Diagnosis not present

## 2016-09-19 ENCOUNTER — Ambulatory Visit: Payer: Medicare Other | Attending: Family Medicine

## 2016-09-19 DIAGNOSIS — R293 Abnormal posture: Secondary | ICD-10-CM | POA: Diagnosis not present

## 2016-09-19 DIAGNOSIS — M542 Cervicalgia: Secondary | ICD-10-CM | POA: Insufficient documentation

## 2016-09-19 NOTE — Therapy (Signed)
[place] MAIN [facility] SERVICES [address] [place], [place], [address] Phone: [phone]   Fax:  [phone]  Physical Therapy Treatment  Patient Details  Name: James MISIASZEK Sr. MRN: [id number] Date of Birth: December 03, 1929 Referring Provider: Lelon Huh  Encounter Date: 09/19/2016      PT End of Session - 09/19/16 1525    Visit Number 10   Number of Visits 25   Date for PT Re-Evaluation November 17, 2016   Authorization Type g codes 10/10   PT Start Time 1430   PT Stop Time 1519   PT Time Calculation (min) 49 min   Activity Tolerance Patient tolerated treatment well;No increased pain   Behavior During Therapy WFL for tasks assessed/performed      Past Medical History:  Diagnosis Date  . Back pain   . CAD (coronary artery disease)   . Diverticulosis   . GERD (gastroesophageal reflux disease)   . History of chicken pox   . History of measles   . History of mumps   . Hyperglycemia   . Hyperlipidemia   . Hypertension   . Hypotension   . Prostate cancer ([place])   . TIA (transient ischemic attack)     Past Surgical History:  Procedure Laterality Date  . APPENDECTOMY  1950  . CARDIAC CATHETERIZATION  05/29/2013   EF=40-45%. Moderate pulmonary hypertension. Infero/ lateral hypokinesis  . CORONARY ARTERY BYPASS GRAFT  2011   with 4 grafts  . MRI BRAIN  06/07/2013   Mild chronic involutional changes. No acute abnormalities  . Myocardial Perfusion Scan  05/29/2013   Abnormal myocardial perfusion image consistent with myocardial infarction  . PROSTATE SURGERY  11/18/2012    Needle biopsy; Adenocarcinoma of Prostate; Dr. Bernardo Heater  . TOTAL HIP ARTHROPLASTY Left 1997   hip joint replacement    There were no vitals filed for this visit.      Subjective Assessment - 09/19/16 1433    Subjective Pt reports his neck has been hurting friday and saturday but has been better since Sunday. States he has pain when he turns his head to the right  and going into extension.    Pertinent History Pt reports R sided neck pain which radiates to R jaw and down into R shoulder. This started approximately 2 months ago. He denies any injury. Pt states that he has been very short of breath recently. He has an appt with his cardiologist tomorrow. Pt has a history of CABG x 5. No history of similar pain in the past. Pt reports that the pain is intermittent and will last for 15 minutes at a time. He states that the pain worsens with straining when lifting something heavy. ROS negative for red flags.     Limitations Other (comment)  Denies limitation   Diagnostic tests Radiographs 03/2016: degenerative changes   Patient Stated Goals Decrease pain   Currently in Pain? No/denies   Pain Onset More than a month ago            [facility] PT Assessment - 09/19/16 1443      AROM   Overall AROM Comments UE grossly WFL without notable deficits. Active and passive L cervical rotation painless with OP, active and passive R cervical rotation are both painful, worsens with OP, active and passive cervical extension is painful, active and passive cervical flexion is painless, Active and passive lateral flexion is painless in both directions including with overpressure. Positive Hoffman L side, negative R side. Negative clonus  or spasticity in bilateral UE. Pain with C5-C7 CPA but more central pressure. Positive reproduction of pain with C3-C5 R UPA. Still unable to reproduce radiating pain into jaw. Pain with first rib mobilizations. which is considerably higher on R side compared to L side. Pt reports he is unable to lay flat so cervical mobility assessed in sitting.    AROM Assessment Site Cervical   Cervical Flexion 40   Cervical Extension 40   Cervical - Right Side Bend 40   Cervical - Left Side Bend 20   Cervical - Right Rotation 60   Cervical - Left Rotation 50     Spurling's   Findings Negative   Side Right     Distraction Test   side Right   Comment  Negative      TREATMENT: Manual therapy: STM with patient positioned in sitting and standing utilizing superficial and deep techniques to the left upper trap and cervical extensors before and following exercises. Patient reports minor decrease in pain after performing.  Therapeutic Exercise: Standing Wall angels facing the wall -- x20, x10 --stoppped due to fatigue; requires cueing to decrease upper trap activation Push up PLUS against wall -- x20 with cueing to activate serratus anterior Scapular retraction at MATRIX -- 17.5 # 2 x 15 cueing to decrease upper trap activation Cervical retraction isometrics against wall with a towel behind the head -- x 20  Straight arm push downs at MATRIX -- 2 x 20 10# on each side; cueing for lower trap activation Sitting high row at MATRIX -- 2 x 20 17.5# with cueing to decrease upper trap activation.         PT Education - 09/19/16 1531    Education provided Yes   Education Details Educated on exercise technique, form and posture throughout therapy session   Person(s) Educated Patient   Methods Explanation;Demonstration   Comprehension Verbalized understanding;Returned demonstration             PT Long Term Goals - 09/19/16 1439      PT LONG TERM GOAL #1   Title Pt will be independent with HEP in order to decrease neck pain and improve function   Baseline 09/19/16: Requires MinA and cueing for exercise performance   Time 6   Period Weeks   Status On-going     PT LONG TERM GOAL #2   Title Pt will decrease NDI by at least 19% in order to demonstrate clinically significant reduction in pain and disability   Baseline 08/08/16: 44% 09/19/16: 18%   Time 6   Period Weeks   Status On-going     PT LONG TERM GOAL #3   Title Pt will improve cervical rotation by at least 5 degrees bilaterally in order to improve ability to look over his shoulder.    Baseline 08/08/16: R: 50 degrees, L: 47 09/19/16: +5-10   Period Weeks   Status Achieved      PT LONG TERM GOAL #4   Title Patient will be able to extend his neck without pain to be able to perform showering activities without increase in pain   Baseline Increased neck pain to 2/10    Time 6   Period Weeks   Status New               Plan - 09/19/16 1526    Clinical Impression Statement Patient returns with no pain upon entering the clinic and focused on performing therapeutic exercise to improve cervical and scapular musculature stabilization.  Patient tolerated patient well with no aggravation of symptoms during therapy session but demonstrates increased muscular fatigue after performance indicating decreased endurance. Patient required frequent cueing on scapular and cervical positioning throughout session indicating decreased coordination. Patient demonstrates improved NDI and cervical AROM scores but continues to have pain with cervical movements limiting functional tasks such as driving. Pt will benefit from further skilled therapy addressing limitations to return to prior level of function.    Rehab Potential Good   Clinical Impairments Affecting Rehab Potential Positive: motivation; Negative: age   PT Frequency 2x / week   PT Duration 6 weeks   PT Treatment/Interventions Cryotherapy;Electrical Stimulation;Iontophoresis '4mg'$ /ml Dexamethasone;Moist Heat;Traction;Gait training;Therapeutic activities;Therapeutic exercise;Balance training;Neuromuscular re-education;Patient/family education;Manual techniques;Passive range of motion;Dry needling   PT Next Visit Plan Therapeutic exercise as long as pain continues to be decreased.    PT Home Exercise Plan repeated cervical retractions, scapular retractions, scalene stretch; Hand written instructions on HEP handout to cue pt to relax shoulders with scapular retraction exercise as practiced this session; MWM R cervical rotation with towel   Consulted and Agree with Plan of Care Patient      Patient will benefit from skilled therapeutic  intervention in order to improve the following deficits and impairments:  Pain, Decreased range of motion  Visit Diagnosis: Cervicalgia  Abnormal posture       G-Codes - 2016-10-13 1507    Functional Assessment Tool Used NDI, Clinical judgement   Functional Limitation Mobility: Walking and moving around   Mobility: Walking and Moving Around Current Status ([id number]) At least 20 percent but less than 40 percent impaired, limited or restricted   Mobility: Walking and Moving Around Goal Status [phone]) At least 1 percent but less than 20 percent impaired, limited or restricted      Problem List Patient Active Problem List   Diagnosis Date Noted  . Cervical neck pain with evidence of disc disease 07/04/2016  . PVC (premature ventricular contraction) 11/16/2015  . Abdominal pain 07/27/2015  . Adenocarcinoma of prostate ([place]) 07/27/2015  . Arthritis 07/27/2015  . Blood in feces 07/27/2015  . BPH (benign prostatic hyperplasia) 07/27/2015  . COPD (chronic obstructive pulmonary disease) ([place]) 07/27/2015  . GERD (gastroesophageal reflux disease) 07/27/2015  . Gout 07/27/2015  . Hyperglycemia 07/27/2015  . Hypotension 07/27/2015  . Insomnia 07/27/2015  . Shortness of breath 07/27/2015  . TIA (transient ischemic attack) 07/27/2015  . Atrial fibrillation ([place]) 11/16/2014  . BP (high blood pressure) 10/13/2014  . Urinary hesitancy 09/02/2012  . ED (erectile dysfunction) of organic origin 06/10/2010  . CAD (coronary artery disease) 03/14/2010  . LBP (low back pain) 03/01/2009  . Allergic rhinitis 09/28/2008  . Essential (primary) hypertension 08/02/2007  . Hyperlipidemia 08/02/2007  . Acquired spondylolisthesis 03/20/2005  . Diverticulosis of colon without hemorrhage 12/18/2000  . Arthropathy 12/18/1998    Blythe Stanford, PT DPT 10-13-16, 3:39 PM  [place] MAIN [facility] SERVICES [address] [place], [place], [address] Phone: [phone]   Fax:   [phone]  Name: James KLUEVER Sr. MRN: [id number] Date of Birth: 02-Jul-1929

## 2016-09-21 ENCOUNTER — Ambulatory Visit: Payer: Medicare Other

## 2016-09-21 DIAGNOSIS — M542 Cervicalgia: Secondary | ICD-10-CM

## 2016-09-21 DIAGNOSIS — R293 Abnormal posture: Secondary | ICD-10-CM

## 2016-09-21 NOTE — Therapy (Signed)
[place] MAIN [facility] SERVICES [address] [place], [place], [address] Phone: [phone]   Fax:  [phone]  Physical Therapy Treatment  Patient Details  Name: James [name] Sr. MRN: [id number] Date of Birth: 01/22/1929 Referring Provider: Lelon Huh  Encounter Date: 09/21/2016      PT End of Session - 09/21/16 1504    Visit Number 11   Number of Visits 25   Date for PT Re-Evaluation Nov 12, 2016   Authorization Type g codes 2023/11/19   PT Start Time 9371   PT Stop Time 1500   PT Time Calculation (min) 45 min   Activity Tolerance Patient tolerated treatment well;No increased pain   Behavior During Therapy WFL for tasks assessed/performed      Past Medical History:  Diagnosis Date  . Back pain   . CAD (coronary artery disease)   . Diverticulosis   . GERD (gastroesophageal reflux disease)   . History of chicken pox   . History of measles   . History of mumps   . Hyperglycemia   . Hyperlipidemia   . Hypertension   . Hypotension   . Prostate cancer ([place])   . TIA (transient ischemic attack)     Past Surgical History:  Procedure Laterality Date  . APPENDECTOMY  1950  . CARDIAC CATHETERIZATION  05/29/2013   EF=40-45%. Moderate pulmonary hypertension. Infero/ lateral hypokinesis  . CORONARY ARTERY BYPASS GRAFT  2011   with 4 grafts  . MRI BRAIN  06/07/2013   Mild chronic involutional changes. No acute abnormalities  . Myocardial Perfusion Scan  05/29/2013   Abnormal myocardial perfusion image consistent with myocardial infarction  . PROSTATE SURGERY  11/18/2012    Needle biopsy; Adenocarcinoma of Prostate; Dr. Bernardo Heater  . TOTAL HIP ARTHROPLASTY Left 1997   hip joint replacement    There were no vitals filed for this visit.      Subjective Assessment - 09/21/16 1418    Subjective Pt reports minimal muscular soreness after the previous treatment. States no pain in the neck currently. Patient reports he's enjoying the change  of exercise. Patient reports SOB when lifting and carrying objects.    Pertinent History Pt reports R sided neck pain which radiates to R jaw and down into R shoulder. This started approximately 2 months ago. He denies any injury. Pt states that he has been very short of breath recently. He has an appt with his cardiologist tomorrow. Pt has a history of CABG x 5. No history of similar pain in the past. Pt reports that the pain is intermittent and will last for 15 minutes at a time. He states that the pain worsens with straining when lifting something heavy. ROS negative for red flags.     Limitations Other (comment)  Denies limitation   Diagnostic tests Radiographs 03/2016: degenerative changes   Patient Stated Goals Decrease pain   Pain Onset More than a month ago      TREATMENT: Manual therapy: STM with patient positioned in supine utilizing superficial and deep techniques to the left upper trap and cervical extensors before and following exercises. Patient reports minor decrease in pain after performing.   Therapeutic Exercise: Push up PLUS against wall -- x20 with cueing to activate serratus anterior Scapular retraction at MATRIX -- 20 # 2 x 15 cueing to decrease upper trap activation Low row at MATRIX 10# 2 x 15 with cueing on proper muscular activation Cervical retraction and lateral isometrics against manual resistance in supine --  5 x 5 sec holds each direction  Sitting high row at MATRIX -- 2 x 20 20# with cueing to decrease upper trap activation. Straight arm push downs at MATRIX -- 2 x 20 10# on each side; cueing for lower trap activation Shoulder flexion with blue physioball overhead - 2 x 20 with cueing for serratus anterior activation         PT Education - 09/21/16 1505    Education provided Yes   Education Details Educated on decreasing upper trap activation and improving scapular retraction   Person(s) Educated Patient   Methods Explanation;Demonstration    Comprehension Verbalized understanding;Returned demonstration             PT Long Term Goals - 09/19/16 1439      PT LONG TERM GOAL #1   Title Pt will be independent with HEP in order to decrease neck pain and improve function   Baseline 09/19/16: Requires MinA and cueing for exercise performance   Time 6   Period Weeks   Status On-going     PT LONG TERM GOAL #2   Title Pt will decrease NDI by at least 19% in order to demonstrate clinically significant reduction in pain and disability   Baseline 08/08/16: 44% 09/19/16: 18%   Time 6   Period Weeks   Status On-going     PT LONG TERM GOAL #3   Title Pt will improve cervical rotation by at least 5 degrees bilaterally in order to improve ability to look over his shoulder.    Baseline 08/08/16: R: 50 degrees, L: 47 09/19/16: +5-10   Period Weeks   Status Achieved     PT LONG TERM GOAL #4   Title Patient will be able to extend his neck without pain to be able to perform showering activities without increase in pain   Baseline Increased neck pain to 2/10    Time 6   Period Weeks   Status New               Plan - 09/21/16 1506    Clinical Impression Statement Patient demonstrates decreased pain after performing manual therapy indicating decreased spasms and greater tissue mobility. Patient able to perform exercises with decreased cueing today indicating improved motor control and muscular coordination. Although patient is improving, he continues to demonstrate increased upper trap activation and decreased muscular endurance and will benefit from further skilled therapy to return to prior level of function.    Rehab Potential Good   Clinical Impairments Affecting Rehab Potential Positive: motivation; Negative: age   PT Frequency 2x / week   PT Duration 6 weeks   PT Treatment/Interventions Cryotherapy;Electrical Stimulation;Iontophoresis '4mg'$ /ml Dexamethasone;Moist Heat;Traction;Gait training;Therapeutic activities;Therapeutic  exercise;Balance training;Neuromuscular re-education;Patient/family education;Manual techniques;Passive range of motion;Dry needling   PT Next Visit Plan Therapeutic exercise as long as pain continues to be decreased; prone retraction   PT Home Exercise Plan repeated cervical retractions, scapular retractions, scalene stretch; Hand written instructions on HEP handout to cue pt to relax shoulders with scapular retraction exercise as practiced this session; MWM R cervical rotation with towel   Consulted and Agree with Plan of Care Patient      Patient will benefit from skilled therapeutic intervention in order to improve the following deficits and impairments:  Pain, Decreased range of motion  Visit Diagnosis: Cervicalgia  Abnormal posture     Problem List Patient Active Problem List   Diagnosis Date Noted  . Cervical neck pain with evidence of disc disease 07/04/2016  .  PVC (premature ventricular contraction) 11/16/2015  . Abdominal pain 07/27/2015  . Adenocarcinoma of prostate ([place]) 07/27/2015  . Arthritis 07/27/2015  . Blood in feces 07/27/2015  . BPH (benign prostatic hyperplasia) 07/27/2015  . COPD (chronic obstructive pulmonary disease) ([place]) 07/27/2015  . GERD (gastroesophageal reflux disease) 07/27/2015  . Gout 07/27/2015  . Hyperglycemia 07/27/2015  . Hypotension 07/27/2015  . Insomnia 07/27/2015  . Shortness of breath 07/27/2015  . TIA (transient ischemic attack) 07/27/2015  . Atrial fibrillation ([place]) 11/16/2014  . BP (high blood pressure) 10/13/2014  . Urinary hesitancy 09/02/2012  . ED (erectile dysfunction) of organic origin 06/10/2010  . CAD (coronary artery disease) 03/14/2010  . LBP (low back pain) 03/01/2009  . Allergic rhinitis 09/28/2008  . Essential (primary) hypertension 08/02/2007  . Hyperlipidemia 08/02/2007  . Acquired spondylolisthesis 03/20/2005  . Diverticulosis of colon without hemorrhage 12/18/2000  . Arthropathy 12/18/1998    Blythe Stanford, PT DPT 09/21/2016, 3:09 PM  [place] MAIN [facility] SERVICES [address] [place], [place], [address] Phone: [phone]   Fax:  [phone]  Name: TRAYVEN LUMADUE Sr. MRN: [id number] Date of Birth: 09-04-1929

## 2016-09-26 ENCOUNTER — Ambulatory Visit (INDEPENDENT_AMBULATORY_CARE_PROVIDER_SITE_OTHER): Payer: Medicare Other | Admitting: Family Medicine

## 2016-09-26 ENCOUNTER — Encounter: Payer: Self-pay | Admitting: Family Medicine

## 2016-09-26 VITALS — BP 104/58 | HR 84 | Temp 99.0°F | Resp 18 | Wt 209.0 lb

## 2016-09-26 DIAGNOSIS — R05 Cough: Secondary | ICD-10-CM

## 2016-09-26 DIAGNOSIS — R059 Cough, unspecified: Secondary | ICD-10-CM

## 2016-09-26 DIAGNOSIS — R0609 Other forms of dyspnea: Secondary | ICD-10-CM | POA: Diagnosis not present

## 2016-09-26 DIAGNOSIS — J984 Other disorders of lung: Secondary | ICD-10-CM

## 2016-09-26 DIAGNOSIS — R0600 Dyspnea, unspecified: Secondary | ICD-10-CM

## 2016-09-26 MED ORDER — AZITHROMYCIN 250 MG PO TABS: ORAL_TABLET | ORAL | 0 refills | Status: AC

## 2016-09-26 NOTE — Progress Notes (Signed)
Patient: James GARY Sr. Male    DOB: [birth year]   82 y.o.   MRN: [id number] Visit Date: 09/26/2016  Today's Provider: Lelon Huh, MD   Chief Complaint  Patient presents with  . Shortness of Breath   Subjective:    HPI Patient comes in today c/o cough and shortness of breath. Patient reports that it has gotten worse over the last 2-3 days. Patient does have history of COPD and reports that he has been compliant with taking his medications. Patient reports that he has shortness of breath more with exertion. Patient reports that since the increase of Symbicort (on last visit ) it seemed to help. Patient denies any chest pain or numbness or tingling in extremities. He has not been taking anything OTC for symptoms.   This is long standing problem that has worsened somewhat the last 3 days. He did have follow up with Dr. Ubaldo Glassing last week and Myoview showing hypokinesis and echo with EF=40%. No increase edema. He states inhaler helps for a few hours. Had CXR in June showing stable cardiomegaly. Had spirometry in June showing moderately severe restriction.   Lab Results  Component Value Date   WBC 5.6 05/22/2016   HGB 14.6 10/07/2014   HCT 43.7 05/22/2016   MCV 89 05/22/2016   PLT 150 05/22/2016        Wt Readings from Last 3 Encounters:  09/26/16 209 lb (94.8 kg)  07/04/16 207 lb (93.9 kg)  05/25/16 208 lb (94.3 kg)    Allergies  Allergen Reactions  . Indomethacin Nausea Only     Current Outpatient Prescriptions:  .  aspirin EC 325 MG tablet, Take by mouth., Disp: , Rfl:  .  atorvastatin (LIPITOR) 80 MG tablet, Take 1 tablet (80 mg total) by mouth daily., Disp: 90 tablet, Rfl: 4 .  azelastine (OPTIVAR) 0.05 % ophthalmic solution, Apply 1 drop to eye 2 (two) times daily., Disp: 6 mL, Rfl: 5 .  benazepril-hydrochlorthiazide (LOTENSIN HCT) 20-12.5 MG tablet, Take 1 tablet by mouth daily., Disp: 90 tablet, Rfl: 1 .  beta carotene w/minerals (OCUVITE) tablet, Take 1  tablet by mouth daily., Disp: , Rfl:  .  budesonide-formoterol (SYMBICORT) 160-4.5 MCG/ACT inhaler, Inhale 2 puffs into the lungs 2 (two) times daily., Disp: 1 Inhaler, Rfl: 12 .  Coenzyme Q10 (CO Q 10) 100 MG CAPS, Take by mouth., Disp: , Rfl:  .  fluticasone (FLONASE) 50 MCG/ACT nasal spray, Place 2 sprays into both nostrils daily., Disp: 48 g, Rfl: 3 .  HYDROcodone-acetaminophen (NORCO/VICODIN) 5-325 MG tablet, Take 1 tablet by mouth 2 (two) times daily as needed., Disp: 30 tablet, Rfl: 0 .  lansoprazole (PREVACID) 30 MG capsule, TAKE ONE CAPSULE EVERY DAY, Disp: 30 capsule, Rfl: 11 .  LORazepam (ATIVAN) 1 MG tablet, Take 1 tablet (1 mg total) by mouth at bedtime as needed for anxiety., Disp: 30 tablet, Rfl: 5 .  Magnesium 400 MG CAPS, Take 1 capsule by mouth daily. , Disp: , Rfl:  .  metoprolol (LOPRESSOR) 50 MG tablet, Take 1 tablet (50 mg total) by mouth 2 (two) times daily., Disp: 180 tablet, Rfl: 4 .  montelukast (SINGULAIR) 10 MG tablet, TAKE 1 TABLET BY MOUTH DAILY, Disp: 30 tablet, Rfl: 12 .  MULTIPLE VITAMIN PO, Take 1 tablet by mouth daily. , Disp: , Rfl:  .  naproxen (NAPROSYN) 500 MG tablet, Take 500 mg by mouth 2 (two) times daily as needed. , Disp: , Rfl:  .  OMEGA-3 FATTY ACIDS PO, Take 1,000 mg by mouth daily. , Disp: , Rfl:  .  SPIRIVA HANDIHALER 18 MCG inhalation capsule, INHALE 1 CAPSULE AS DIRECTED ONCE A DAY, Disp: 30 capsule, Rfl: 5 .  tamsulosin (FLOMAX) 0.4 MG CAPS capsule, Take 1 capsule (0.4 mg total) by mouth daily., Disp: 90 capsule, Rfl: 4 .  traMADol (ULTRAM) 50 MG tablet, Take 50 mg by mouth 2 (two) times daily. , Disp: , Rfl:  .  furosemide (LASIX) 20 MG tablet, Take 1 tablet (20 mg total) by mouth daily. (Patient not taking: Reported on 09/26/2016), Disp: 30 tablet, Rfl: 0  Review of Systems  Constitutional: Positive for activity change and fatigue. Negative for appetite change, chills, diaphoresis, fever and unexpected weight change.  HENT: Positive for  postnasal drip.   Respiratory: Positive for cough and shortness of breath.   Cardiovascular: Negative.   Musculoskeletal: Negative.   Skin: Negative.   Neurological: Negative.     Social History  Substance Use Topics  . Smoking status: Former Smoker    Packs/day: 1.00    Years: 11.00    Types: Cigarettes    Quit date: 12/18/1978  . Smokeless tobacco: Never Used  . Alcohol use 0.0 oz/week     Comment: Moderate use; 2 beers per week   Objective:   BP (!) 104/58   Pulse 84   Temp 99 F (37.2 C)   Resp 18   Wt 209 lb (94.8 kg)   SpO2 93%   BMI 33.73 kg/m   Physical Exam  General Appearance:    Alert, cooperative, no distress  HENT:   ENT exam normal, no neck nodes or sinus tenderness  Eyes:    PERRL, conjunctiva/corneas clear, EOM's intact       Lungs:     Clear to auscultation bilaterally, respirations unlabored. Occasional expiratory wheezes.   Heart:    Distant heart soundsRegular rate and rhythm  Neurologic:   Awake, alert, oriented x 3. No apparent focal neurological           defect.           Assessment & Plan:     1. Cough Zpack  2. Dyspnea on exertion Chronic, but worsening. Likely multifactorial. Dr. Ubaldo Glassing managing CHF, felt to be stable.  - Ambulatory referral to Pulmonology  3. Restrictive lung disease On Advair and Spiriva. Unclear if worsening symptoms are cardiac or pulmonary in nature.        Lelon Huh, MD  [facility]

## 2016-09-28 ENCOUNTER — Ambulatory Visit: Payer: Medicare Other | Admitting: Physical Therapy

## 2016-09-28 ENCOUNTER — Encounter: Payer: Self-pay | Admitting: Physical Therapy

## 2016-09-28 ENCOUNTER — Encounter: Payer: Self-pay | Admitting: Internal Medicine

## 2016-09-28 ENCOUNTER — Ambulatory Visit (INDEPENDENT_AMBULATORY_CARE_PROVIDER_SITE_OTHER): Payer: Medicare Other | Admitting: Internal Medicine

## 2016-09-28 VITALS — BP 122/70 | HR 73 | Ht 68.5 in | Wt 208.0 lb

## 2016-09-28 VITALS — BP 146/82 | HR 40

## 2016-09-28 DIAGNOSIS — R293 Abnormal posture: Secondary | ICD-10-CM

## 2016-09-28 DIAGNOSIS — Z23 Encounter for immunization: Secondary | ICD-10-CM

## 2016-09-28 DIAGNOSIS — J449 Chronic obstructive pulmonary disease, unspecified: Secondary | ICD-10-CM | POA: Diagnosis not present

## 2016-09-28 DIAGNOSIS — I10 Essential (primary) hypertension: Secondary | ICD-10-CM | POA: Diagnosis not present

## 2016-09-28 DIAGNOSIS — J984 Other disorders of lung: Secondary | ICD-10-CM

## 2016-09-28 DIAGNOSIS — I2510 Atherosclerotic heart disease of native coronary artery without angina pectoris: Secondary | ICD-10-CM | POA: Diagnosis not present

## 2016-09-28 DIAGNOSIS — I4891 Unspecified atrial fibrillation: Secondary | ICD-10-CM | POA: Diagnosis not present

## 2016-09-28 DIAGNOSIS — E782 Mixed hyperlipidemia: Secondary | ICD-10-CM | POA: Diagnosis not present

## 2016-09-28 DIAGNOSIS — M542 Cervicalgia: Secondary | ICD-10-CM

## 2016-09-28 MED ORDER — ALBUTEROL SULFATE HFA 108 (90 BASE) MCG/ACT IN AERS: 2.0000 | INHALATION_SPRAY | Freq: Four times a day (QID) | RESPIRATORY_TRACT | 2 refills | Status: DC | PRN

## 2016-09-28 NOTE — Patient Instructions (Addendum)
--  Go for a walk with your wife every day.   --Will refer to pulmonary rehab in November.   --Proair inhaler, take 2 puffs before exercise, or when experiencing dyspnea.   --Will give flu vaccine today.

## 2016-09-28 NOTE — Addendum Note (Signed)
Addended by: Maryanna Shape A on: 09/28/2016 04:22 PM   Modules accepted: Orders

## 2016-09-28 NOTE — Progress Notes (Signed)
[place] [facility] Consultation      Assessment and Plan:  COPD.  --Continue spiriva, symbicort.  --Add slow walking 20 min per day.  --Recommend Prevnar-13 vaccine if not given already.   Deconditioning/Debility.  --Recommend pulmonary rehab, but he is currently going through PT, and would like to hold off until he completes PT in November.   Elevated right diaphragm.  --Present for many years, likely contributing to dyspnea.   Chronic systolic cardiac dysfunction.  --Stable EF of 40%.   Dyspnea. --Dyspnea is likely multifactorial due to all of the above issues.  --I will prescribe an albuterol MDI to use with exercise.   Date: 09/28/2016  MRN# [id number] James MIZUNO Sr. 1929-06-12   [name] Sr. is a 82 y.o. old male seen in consultation for chief complaint of:    Chief Complaint  Patient presents with  . pulmonary consult    per Dr. Caryn Section. pt c/o sob with exertion X15y & occ prod ocugh with white to yellow mucus X4-5wk    HPI:   He notes that he has had trouble breathing progressively for the past 15 years.  He notes that his weight has increased about 25 lbs in that time. He used to smoke last about 40 years ago. He has noted about ppd.   He is using spiriva and symbicort inhalers, which helped. He notes that his throat is sore. He is rinsing his mouth after use.  He had a CABG in 2011, they noted that his diaphragm on his right side was high.   He has cats and dogs at home, seldom in the bedroom. He denies cough, occasional mucus production.  He recently had a echo and stress test with Dr. Ubaldo Glassing.   Review of Dr. Bethanne Ginger records: He is noted to have controlled Afib, most recent echocardiogram showed stable systolic function of about 40%.   Review of CXR images with patient; 05/22/16; elevated right diaphragm, stable for several years.   Review of office spirometry today 09/28/16; restriction with FEV1=48%  PMHX:   Past Medical History:    Diagnosis Date  . Back pain   . CAD (coronary artery disease)   . Diverticulosis   . GERD (gastroesophageal reflux disease)   . History of chicken pox   . History of measles   . History of mumps   . Hyperglycemia   . Hyperlipidemia   . Hypertension   . Hypotension   . Prostate cancer ([place])   . TIA (transient ischemic attack)    Surgical Hx:  Past Surgical History:  Procedure Laterality Date  . APPENDECTOMY  1950  . CARDIAC CATHETERIZATION  05/29/2013   EF=40-45%. Moderate pulmonary hypertension. Infero/ lateral hypokinesis  . CORONARY ARTERY BYPASS GRAFT  2011   with 4 grafts  . MRI BRAIN  06/07/2013   Mild chronic involutional changes. No acute abnormalities  . Myocardial Perfusion Scan  05/29/2013   Abnormal myocardial perfusion image consistent with myocardial infarction  . PROSTATE SURGERY  11/18/2012    Needle biopsy; Adenocarcinoma of Prostate; Dr. Bernardo Heater  . TOTAL HIP ARTHROPLASTY Left 1997   hip joint replacement   Family Hx:  Family History  Problem Relation Age of Onset  . Heart attack Mother   . Stroke Father   . Heart attack Father   . Hypertension Father   . Heart attack Sister   . Bladder Cancer Brother   . Kidney cancer Brother   . Heart Problems Brother    Social  Hx:   Social History  Substance Use Topics  . Smoking status: Former Smoker    Packs/day: 1.00    Years: 11.00    Types: Cigarettes    Quit date: 12/18/1978  . Smokeless tobacco: Never Used  . Alcohol use 0.0 oz/week     Comment: Moderate use; 2 beers per week   Medication:   Reviewed.     Allergies:  Indomethacin  Review of Systems: Gen:  Denies  fever, sweats, chills HEENT: Denies blurred vision, double vision. bleeds, sore throat Cvc:  No dizziness, chest pain. Resp:   Denies cough or sputum production, Gi: Denies swallowing difficulty, stomach pain. Gu:  Denies bladder incontinence, burning urine Ext:   No Joint pain, stiffness. Skin: No skin rash,  hives  Endoc:  No  polyuria, polydipsia. Psych: No depression, insomnia. Other:  All other systems were reviewed with the patient and were negative other that what is mentioned in the HPI.   Physical Examination:   VS: BP 122/70 (BP Location: Left Arm, Cuff Size: Normal)   Pulse 73   Ht 5' 8.5" (1.74 m)   Wt 208 lb (94.3 kg)   SpO2 93%   BMI 31.17 kg/m   General Appearance: No distress  Neuro:without focal findings,  speech normal,  HEENT: PERRLA, EOM intact.   Pulmonary: normal breath sounds, No wheezing.  CardiovascularNormal S1,S2.  No m/r/g.   Abdomen: Benign, Soft, non-tender. Renal:  No costovertebral tenderness  GU:  No performed at this time. Endoc: No evident thyromegaly, no signs of acromegaly. Skin:   warm, no rashes, no ecchymosis  Extremities: normal, no cyanosis, clubbing.  Other findings:    LABORATORY PANEL:   CBC No results for input(s): WBC, HGB, HCT, PLT in the last 168 hours. ------------------------------------------------------------------------------------------------------------------  Chemistries  No results for input(s): NA, K, CL, CO2, GLUCOSE, BUN, CREATININE, CALCIUM, MG, AST, ALT, ALKPHOS, BILITOT in the last 168 hours.  Invalid input(s): GFRCGP ------------------------------------------------------------------------------------------------------------------  Cardiac Enzymes No results for input(s): TROPONINI in the last 168 hours. ------------------------------------------------------------  RADIOLOGY:  No results found.     Thank  you for the consultation and for allowing [facility] to assist in the care of your patient. Our recommendations are noted above.  Please contact us if we can be of further service.   Marda Stalker, MD.  Board Certified in Internal Medicine, Pulmonary Medicine, [place], and Sleep Medicine.  [facility] Office Number: [phone]  Patricia Pesa, M.D.    Vilinda Boehringer, M.D.  Merton Border, M.D  09/28/2016

## 2016-09-28 NOTE — Therapy (Signed)
[place] MAIN [facility] SERVICES [address] [place], [place], [address] Phone: [phone]   Fax:  [phone]  Physical Therapy Treatment  Patient Details  Name: James USMAN Sr. MRN: [id number] Date of Birth: [birth year] Referring Provider: Lelon Huh  Encounter Date: 09/28/2016    Past Medical History:  Diagnosis Date  . Back pain   . CAD (coronary artery disease)   . Diverticulosis   . GERD (gastroesophageal reflux disease)   . History of chicken pox   . History of measles   . History of mumps   . Hyperglycemia   . Hyperlipidemia   . Hypertension   . Hypotension   . Prostate cancer ([place])   . TIA (transient ischemic attack)     Past Surgical History:  Procedure Laterality Date  . APPENDECTOMY  1950  . CARDIAC CATHETERIZATION  05/29/2013   EF=40-45%. Moderate pulmonary hypertension. Infero/ lateral hypokinesis  . CORONARY ARTERY BYPASS GRAFT  2011   with 4 grafts  . MRI BRAIN  06/07/2013   Mild chronic involutional changes. No acute abnormalities  . Myocardial Perfusion Scan  05/29/2013   Abnormal myocardial perfusion image consistent with myocardial infarction  . PROSTATE SURGERY  11/18/2012    Needle biopsy; Adenocarcinoma of Prostate; Dr. Bernardo Heater  . TOTAL HIP ARTHROPLASTY Left 1997   hip joint replacement    Vitals:   09/28/16 1513  BP: (!) 146/82  Pulse: (!) 40  SpO2: 95%        Subjective Assessment - 09/28/16 1513    Subjective Pt reports his neck has flared up over the last three days, he reports pain with R cervical rotation, extension.  Denies any new activity since last session.  Vitals taken and pt noted to have pulse ranging 30-80 bpm.  Pt went to his MD 2 days ago due to SOB who sent him to a Pulmonologist who said pt's weight is playing a role in his SOB and suggested weight loss.  Pt has known elevated diaphragm on R.  Per review of Dr. Bethanne Ginger notes (Cardiologist) pt is noted to have controlled afib.  Pt reports feeling fatigued and SOB over the past 3 days and this, combined with his fluctuating HR, this PT called Dr Bethanne Ginger office and spoke to Dr. Bethanne Ginger nurse who suggested pt to come in to see Dr. Ubaldo Glassing.  Pt left PT appointment at15:40 to go to Dr. Bethanne Ginger office for follow up.    Pertinent History Pt reports R sided neck pain which radiates to R jaw and down into R shoulder. This started approximately 2 months ago. He denies any injury. Pt states that he has been very short of breath recently. He has an appt with his cardiologist tomorrow. Pt has a history of CABG x 5. No history of similar pain in the past. Pt reports that the pain is intermittent and will last for 15 minutes at a time. He states that the pain worsens with straining when lifting something heavy. ROS negative for red flags.     Limitations Other (comment)  Denies limitation   Diagnostic tests Radiographs 03/2016: degenerative changes   Patient Stated Goals Decrease pain   Currently in Pain? Yes   Pain Location Neck   Pain Orientation Right;Posterior;Anterior   Pain Descriptors / Indicators Sharp   Pain Type Chronic pain   Pain Onset More than a month ago  PT Long Term Goals - 09/19/16 1439      PT LONG TERM GOAL #1   Title Pt will be independent with HEP in order to decrease neck pain and improve function   Baseline 09/19/16: Requires MinA and cueing for exercise performance   Time 6   Period Weeks   Status On-going     PT LONG TERM GOAL #2   Title Pt will decrease NDI by at least 19% in order to demonstrate clinically significant reduction in pain and disability   Baseline 08/08/16: 44% 09/19/16: 18%   Time 6   Period Weeks   Status On-going     PT LONG TERM GOAL #3   Title Pt will improve cervical rotation by at least 5 degrees bilaterally in order to improve ability to look over his shoulder.    Baseline 08/08/16: R: 50 degrees, L: 47 09/19/16: +5-10    Period Weeks   Status Achieved     PT LONG TERM GOAL #4   Title Patient will be able to extend his neck without pain to be able to perform showering activities without increase in pain   Baseline Increased neck pain to 2/10    Time 6   Period Weeks   Status New             Patient will benefit from skilled therapeutic intervention in order to improve the following deficits and impairments:     Visit Diagnosis: Cervicalgia  Abnormal posture     Problem List Patient Active Problem List   Diagnosis Date Noted  . Restrictive lung disease 09/26/2016  . Cervical neck pain with evidence of disc disease 07/04/2016  . PVC (premature ventricular contraction) 11/16/2015  . Abdominal pain 07/27/2015  . Adenocarcinoma of prostate ([place]) 07/27/2015  . Arthritis 07/27/2015  . Blood in feces 07/27/2015  . BPH (benign prostatic hyperplasia) 07/27/2015  . COPD (chronic obstructive pulmonary disease) ([place]) 07/27/2015  . GERD (gastroesophageal reflux disease) 07/27/2015  . Gout 07/27/2015  . Hyperglycemia 07/27/2015  . Hypotension 07/27/2015  . Insomnia 07/27/2015  . Shortness of breath 07/27/2015  . TIA (transient ischemic attack) 07/27/2015  . Atrial fibrillation ([place]) 11/16/2014  . BP (high blood pressure) 10/13/2014  . Urinary hesitancy 09/02/2012  . ED (erectile dysfunction) of organic origin 06/10/2010  . CAD (coronary artery disease) 03/14/2010  . LBP (low back pain) 03/01/2009  . Allergic rhinitis 09/28/2008  . Essential (primary) hypertension 08/02/2007  . Hyperlipidemia 08/02/2007  . Acquired spondylolisthesis 03/20/2005  . Diverticulosis of colon without hemorrhage 12/18/2000  . Arthropathy 12/18/1998    Collie Siad PT, DPT 09/28/2016, 4:07 PM  [place] [facility] SERVICES [address] [place], [place], [address] Phone: [phone]   Fax:  [phone]  Name: James VANSCYOC Sr. MRN: [id number] Date of Birth:  01-Feb-1929

## 2016-10-05 ENCOUNTER — Other Ambulatory Visit: Payer: Self-pay | Admitting: Radiation Oncology

## 2016-10-09 DIAGNOSIS — I480 Paroxysmal atrial fibrillation: Secondary | ICD-10-CM | POA: Diagnosis not present

## 2016-10-24 ENCOUNTER — Encounter: Payer: Medicare Other | Attending: Internal Medicine | Admitting: *Deleted

## 2016-10-24 VITALS — Ht 65.0 in | Wt 207.7 lb

## 2016-10-24 DIAGNOSIS — R05 Cough: Secondary | ICD-10-CM | POA: Diagnosis not present

## 2016-10-24 DIAGNOSIS — R0609 Other forms of dyspnea: Secondary | ICD-10-CM | POA: Diagnosis not present

## 2016-10-24 DIAGNOSIS — J449 Chronic obstructive pulmonary disease, unspecified: Secondary | ICD-10-CM

## 2016-10-24 NOTE — Patient Instructions (Signed)
Patient Instructions  Patient Details  Name: James FILS Sr. MRN: [id number] Date of Birth: 1929/02/26 Referring Provider:  Laverle Hobby, *  Below are the personal goals you chose as well as exercise and nutrition goals. Our goal is to help you keep on track towards obtaining and maintaining your goals. We will be discussing your progress on these goals with you throughout the program.  Initial Exercise Prescription:     Initial Exercise Prescription - 10/24/16 1300      Date of Initial Exercise RX and Referring Provider   Date 10/24/16   Referring Provider Ramachandran     Treadmill   MPH 1.4   Grade 0   Minutes 15  5/5/5   METs 2.07     NuStep   Level 2   Minutes 15   METs 2     T5 Nustep   Level 1   Minutes 15   METs 2      Exercise Goals: Frequency: Be able to perform aerobic exercise three times per week working toward 3-5 days per week.  Intensity: Work with a perceived exertion of 11 (fairly light) - 15 (hard) as tolerated. Follow your new exercise prescription and watch for changes in prescription as you progress with the program. Changes will be reviewed with you when they are made.  Duration: You should be able to do 30 minutes of continuous aerobic exercise in addition to a 5 minute warm-up and a 5 minute cool-down routine.  Nutrition Goals: Your personal nutrition goals will be established when you do your nutrition analysis with the dietician.  The following are nutrition guidelines to follow: Cholesterol < '200mg'$ /day Sodium < '1500mg'$ /day Fiber: Men over 50 yrs - 30 grams per day  Personal Goals:     Personal Goals and Risk Factors at Admission - 10/24/16 1207      Core Components/Risk Factors/Patient Goals on Admission    Weight Management Yes;Weight Maintenance   Admit Weight 207 lb 11.2 oz (94.2 kg)   Goal Weight: Short Term 204 lb (92.5 kg)   Goal Weight: Long Term 180 lb (81.6 kg)   Expected Outcomes Short Term: Continue to assess  and modify interventions until short term weight is achieved;Long Term: Adherence to nutrition and physical activity/exercise program aimed toward attainment of established weight goal;Weight Loss: Understanding of general recommendations for a balanced deficit meal plan, which promotes 1-2 lb weight loss per week and includes a negative energy balance of [phone] kcal/d   Sedentary Yes   Intervention Provide advice, education, support and counseling about physical activity/exercise needs.;Develop an individualized exercise prescription for aerobic and resistive training based on initial evaluation findings, risk stratification, comorbidities and participant's personal goals.   Expected Outcomes Achievement of increased cardiorespiratory fitness and enhanced flexibility, muscular endurance and strength shown through measurements of functional capacity and personal statement of participant.   Increase Strength and Stamina Yes   Intervention Provide advice, education, support and counseling about physical activity/exercise needs.;Develop an individualized exercise prescription for aerobic and resistive training based on initial evaluation findings, risk stratification, comorbidities and participant's personal goals.   Expected Outcomes Achievement of increased cardiorespiratory fitness and enhanced flexibility, muscular endurance and strength shown through measurements of functional capacity and personal statement of participant.   Improve shortness of breath with ADL's Yes   Intervention Provide education, individualized exercise plan and daily activity instruction to help decrease symptoms of SOB with activities of daily living.   Expected Outcomes Short Term: Achieves a reduction of  symptoms when performing activities of daily living.   Develop more efficient breathing techniques such as purse lipped breathing and diaphragmatic breathing; and practicing self-pacing with activity Yes   Intervention  Provide education, demonstration and support about specific breathing techniuqes utilized for more efficient breathing. Include techniques such as pursed lipped breathing, diaphragmatic breathing and self-pacing activity.   Expected Outcomes Short Term: Participant will be able to demonstrate and use breathing techniques as needed throughout daily activities.   Increase knowledge of respiratory medications and ability to use respiratory devices properly  Yes   Intervention Provide education and demonstration as needed of appropriate use of medications, inhalers, and oxygen therapy.   Expected Outcomes Short Term: Achieves understanding of medications use. Understands that oxygen is a medication prescribed by physician. Demonstrates appropriate use of inhaler and oxygen therapy.   Hypertension Yes   Intervention Provide education on lifestyle modifcations including regular physical activity/exercise, weight management, moderate sodium restriction and increased consumption of fresh fruit, vegetables, and low fat dairy, alcohol moderation, and smoking cessation.;Monitor prescription use compliance.   Expected Outcomes Short Term: Continued assessment and intervention until BP is < 140/48m HG in hypertensive participants. < 130/853mHG in hypertensive participants with diabetes, heart failure or chronic kidney disease.;Long Term: Maintenance of blood pressure at goal levels.   Lipids Yes   Intervention Provide education and support for participant on nutrition & aerobic/resistive exercise along with prescribed medications to achieve LDL '70mg'$ , HDL >'40mg'$ .   Expected Outcomes Short Term: Participant states understanding of desired cholesterol values and is compliant with medications prescribed. Participant is following exercise prescription and nutrition guidelines.;Long Term: Cholesterol controlled with medications as prescribed, with individualized exercise RX and with personalized nutrition plan. Value  goals: LDL < '70mg'$ , HDL > 40 mg.      Tobacco Use Initial Evaluation: History  Smoking Status   Former Smoker   Packs/day: 1.00   Years: 11.00   Types: Cigarettes   Quit date: 12/18/1978  Smokeless Tobacco   Never Used    Copy of goals given to participant.

## 2016-10-24 NOTE — Progress Notes (Signed)
Pulmonary Individual Treatment Plan  Patient Details  Name: James SISTRUNK Sr. MRN: [id number] Date of Birth: [birth year] Referring Provider:   Flowsheet Row Pulmonary Rehab from 10/24/2016 in [facility] Cardiac and Pulmonary Rehab  Referring Provider  Ramachandran      Initial Encounter Date:  Flowsheet Row Pulmonary Rehab from 10/24/2016 in [facility] Cardiac and Pulmonary Rehab  Date  10/24/16  Referring Provider  Ashby Dawes      Visit Diagnosis: COPD, mild ([place])  Patient's Home Medications on Admission:  Current Outpatient Prescriptions:    albuterol (PROAIR HFA) 108 (90 Base) MCG/ACT inhaler, Inhale 2 puffs into the lungs every 6 (six) hours as needed for wheezing or shortness of breath., Disp: 1 Inhaler, Rfl: 2   aspirin EC 325 MG tablet, Take by mouth., Disp: , Rfl:    atorvastatin (LIPITOR) 80 MG tablet, Take 1 tablet (80 mg total) by mouth daily., Disp: 90 tablet, Rfl: 4   azelastine (OPTIVAR) 0.05 % ophthalmic solution, Apply 1 drop to eye 2 (two) times daily., Disp: 6 mL, Rfl: 5   benazepril-hydrochlorthiazide (LOTENSIN HCT) 20-12.5 MG tablet, Take 1 tablet by mouth daily., Disp: 90 tablet, Rfl: 1   beta carotene w/minerals (OCUVITE) tablet, Take 1 tablet by mouth daily., Disp: , Rfl:    budesonide-formoterol (SYMBICORT) 160-4.5 MCG/ACT inhaler, Inhale 2 puffs into the lungs 2 (two) times daily., Disp: 1 Inhaler, Rfl: 12   fluticasone (FLONASE) 50 MCG/ACT nasal spray, Place 2 sprays into both nostrils daily., Disp: 48 g, Rfl: 3   lansoprazole (PREVACID) 30 MG capsule, TAKE 1 CAPSULE EVERY DAY, Disp: 30 capsule, Rfl: 6   LORazepam (ATIVAN) 1 MG tablet, Take 1 tablet (1 mg total) by mouth at bedtime as needed for anxiety., Disp: 30 tablet, Rfl: 5   Magnesium 400 MG CAPS, Take 1 capsule by mouth daily. , Disp: , Rfl:    metoprolol (LOPRESSOR) 50 MG tablet, Take 1 tablet (50 mg total) by mouth 2 (two) times daily., Disp: 180 tablet, Rfl: 4   montelukast (SINGULAIR) 10 MG  tablet, TAKE 1 TABLET BY MOUTH DAILY, Disp: 30 tablet, Rfl: 12   MULTIPLE VITAMIN PO, Take 1 tablet by mouth daily. , Disp: , Rfl:    naproxen (NAPROSYN) 500 MG tablet, Take 500 mg by mouth 2 (two) times daily as needed. , Disp: , Rfl:    OMEGA-3 FATTY ACIDS PO, Take 1,000 mg by mouth daily. , Disp: , Rfl:    SPIRIVA HANDIHALER 18 MCG inhalation capsule, INHALE 1 CAPSULE AS DIRECTED ONCE A DAY, Disp: 30 capsule, Rfl: 5   tamsulosin (FLOMAX) 0.4 MG CAPS capsule, Take 1 capsule (0.4 mg total) by mouth daily., Disp: 90 capsule, Rfl: 4   traMADol (ULTRAM) 50 MG tablet, Take 50 mg by mouth 2 (two) times daily. , Disp: , Rfl:    furosemide (LASIX) 20 MG tablet, Take 1 tablet (20 mg total) by mouth daily., Disp: 30 tablet, Rfl: 0   HYDROcodone-acetaminophen (NORCO/VICODIN) 5-325 MG tablet, Take 1 tablet by mouth 2 (two) times daily as needed., Disp: 30 tablet, Rfl: 0  Past Medical History: Past Medical History:  Diagnosis Date   Back pain    CAD (coronary artery disease)    Diverticulosis    GERD (gastroesophageal reflux disease)    History of chicken pox    History of measles    History of mumps    Hyperglycemia    Hyperlipidemia    Hypertension    Hypotension    Prostate cancer ([place])  TIA (transient ischemic attack)     Tobacco Use: History  Smoking Status   Former Smoker   Packs/day: 1.00   Years: 11.00   Types: Cigarettes   Quit date: 12/18/1978  Smokeless Tobacco   Never Used    Labs: Recent Review Flowsheet Data    Labs for ITP Cardiac and Pulmonary Rehab Latest Ref Rng & Units 10/05/2014 10/07/2014   Cholestrol 0 - 200 mg/dL - 104   LDLCALC mg/dL - 40   HDL 35 - 70 mg/dL - 52   Trlycerides 40 - 160 mg/dL - 61   Hemoglobin A1c 4.0 - 6.0 % 5.7 5.7       ADL UCSD:     Pulmonary Assessment Scores    Row Name 10/24/16 1213         ADL UCSD   ADL Phase Entry     SOB Score total 29     Rest 1     Walk 2     Stairs 3     Bath 3      Dress 2     Shop 1        Pulmonary Function Assessment:     Pulmonary Function Assessment - 10/24/16 1212      Initial Spirometry Results   FVC% 37 %   FEV1% 47 %   FEV1/FVC Ratio 87.3     Breath   Bilateral Breath Sounds Clear   Shortness of Breath Yes;Limiting activity      Exercise Target Goals: Date: 10/24/16  Exercise Program Goal: Individual exercise prescription set with THRR, safety & activity barriers. Participant demonstrates ability to understand and report RPE using BORG scale, to self-measure pulse accurately, and to acknowledge the importance of the exercise prescription.  Exercise Prescription Goal: Starting with aerobic activity 30 plus minutes a day, 3 days per week for initial exercise prescription. Provide home exercise prescription and guidelines that participant acknowledges understanding prior to discharge.  Activity Barriers & Risk Stratification:     Activity Barriers & Cardiac Risk Stratification - 10/24/16 1211      Activity Barriers & Cardiac Risk Stratification   Activity Barriers Back Problems;Arthritis;Shortness of Breath;Deconditioning      6 Minute Walk:     6 Minute Walk    Row Name 10/24/16 1258         6 Minute Walk   Distance 800 feet     Walk Time 6 minutes     # of Rest Breaks 0     MPH 1.5     METS 2.15     RPE 17     Perceived Dyspnea  4     VO2 Peak 1.9     Symptoms Yes (comment)     Comments left hip pain more than breathing     Resting HR 67 bpm     Resting BP 126/62     Max Ex. HR 110 bpm     Max Ex. BP 150/70       Interval HR   Baseline HR 67     1 Minute HR 92     2 Minute HR 99     3 Minute HR 96     4 Minute HR 110     5 Minute HR 99     6 Minute HR 100     2 Minute Post HR 81     Interval Heart Rate? Yes       Interval Oxygen   Interval  Oxygen? Yes     Baseline Oxygen Saturation % 96 %     1 Minute Oxygen Saturation % 92 %     2 Minute Oxygen Saturation % 92 %     3 Minute Oxygen  Saturation % 91 %     4 Minute Oxygen Saturation % 93 %     5 Minute Oxygen Saturation % 91 %     6 Minute Oxygen Saturation % 93 %     2 Minute Post Oxygen Saturation % 96 %        Initial Exercise Prescription:     Initial Exercise Prescription - 10/24/16 1300      Date of Initial Exercise RX and Referring Provider   Date 10/24/16   Referring Provider Ramachandran     Treadmill   MPH 1.4   Grade 0   Minutes 15  5/5/5   METs 2.07     NuStep   Level 2   Minutes 15   METs 2     T5 Nustep   Level 1   Minutes 15   METs 2      Perform Capillary Blood Glucose checks as needed.  Exercise Prescription Changes:   Exercise Comments:   Discharge Exercise Prescription (Final Exercise Prescription Changes):    Nutrition:  Target Goals: Understanding of nutrition guidelines, daily intake of sodium '1500mg'$ , cholesterol '200mg'$ , calories 30% from fat and 7% or less from saturated fats, daily to have 5 or more servings of fruits and vegetables.  Biometrics:     Pre Biometrics - 10/24/16 1257      Pre Biometrics   Height '5\' 5"'$  (1.651 m)   Weight 207 lb 11.2 oz (94.2 kg)   Waist Circumference 44.25 inches   Hip Circumference 44.25 inches   Waist to Hip Ratio 1 %   BMI (Calculated) 34.6       Nutrition Therapy Plan and Nutrition Goals:     Nutrition Therapy & Goals - 10/24/16 1207      Intervention Plan   Intervention Prescribe, educate and counsel regarding individualized specific dietary modifications aiming towards targeted core components such as weight, hypertension, lipid management, diabetes, heart failure and other comorbidities.   Expected Outcomes Short Term Goal: Understand basic principles of dietary content, such as calories, fat, sodium, cholesterol and nutrients.;Short Term Goal: A plan has been developed with personal nutrition goals set during dietitian appointment.;Long Term Goal: Adherence to prescribed nutrition plan.      Nutrition  Discharge: Rate Your Plate Scores:   Psychosocial: Target Goals: Acknowledge presence or absence of depression, maximize coping skills, provide positive support system. Participant is able to verbalize types and ability to use techniques and skills needed for reducing stress and depression.  Initial Review & Psychosocial Screening:     Initial Psych Review & Screening - 10/24/16 1209      Initial Review   Current issues with --  No concerns noted     Family Dynamics   Good Support System? Yes  Wife and neighbors     Barriers   Psychosocial barriers to participate in program There are no identifiable barriers or psychosocial needs.;The patient should benefit from training in stress management and relaxation.     Screening Interventions   Interventions Encouraged to exercise      Quality of Life Scores:     Quality of Life - 10/24/16 1218      Quality of Life Scores   Health/Function Pre 19.81 %  Socioeconomic Pre 21 %   Psych/Spiritual Pre 21 %   Family Pre 21 %   GLOBAL Pre 20.46 %      PHQ-9: Recent Review Flowsheet Data    Depression screen [facility] 2/9 10/24/2016 12/03/2015   Decreased Interest 0 0   Down, Depressed, Hopeless 0 0   PHQ - 2 Score 0 0   Altered sleeping 1 -   Tired, decreased energy 2 -   Change in appetite 0 -   Feeling bad or failure about yourself  2  -   Trouble concentrating 1 -   Moving slowly or fidgety/restless 0 -   Suicidal thoughts 0 -   PHQ-9 Score 6 -   Difficult doing work/chores Somewhat difficult -      Psychosocial Evaluation and Intervention:   Psychosocial Re-Evaluation:  Education: Education Goals: Education classes will be provided on a weekly basis, covering required topics. Participant will state understanding/return demonstration of topics presented.  Learning Barriers/Preferences:     Learning Barriers/Preferences - 10/24/16 1211      Learning Barriers/Preferences   Learning Barriers Hearing   Learning  Preferences None      Education Topics: Initial Evaluation Education: - Verbal, written and demonstration of respiratory meds, RPE/PD scales, oximetry and breathing techniques. Instruction on use of nebulizers and MDIs: cleaning and proper use, rinsing mouth with steroid doses and importance of monitoring MDI activations.   General Nutrition Guidelines/Fats and Fiber: -Group instruction provided by verbal, written material, models and posters to present the general guidelines for heart healthy nutrition. Gives an explanation and review of dietary fats and fiber.   Controlling Sodium/Reading Food Labels: -Group verbal and written material supporting the discussion of sodium use in heart healthy nutrition. Review and explanation with models, verbal and written materials for utilization of the food label.   Exercise Physiology & Risk Factors: - Group verbal and written instruction with models to review the exercise physiology of the cardiovascular system and associated critical values. Details cardiovascular disease risk factors and the goals associated with each risk factor.   Aerobic Exercise & Resistance Training: - Gives group verbal and written discussion on the health impact of inactivity. On the components of aerobic and resistive training programs and the benefits of this training and how to safely progress through these programs.   Flexibility, Balance, General Exercise Guidelines: - Provides group verbal and written instruction on the benefits of flexibility and balance training programs. Provides general exercise guidelines with specific guidelines to those with heart or lung disease. Demonstration and skill practice provided.   Stress Management: - Provides group verbal and written instruction about the health risks of elevated stress, cause of high stress, and healthy ways to reduce stress.   Depression: - Provides group verbal and written instruction on the correlation  between heart/lung disease and depressed mood, treatment options, and the stigmas associated with seeking treatment.   Exercise & Equipment Safety: - Individual verbal instruction and demonstration of equipment use and safety with use of the equipment. Flowsheet Row Pulmonary Rehab from 10/24/2016 in [facility] Cardiac and Pulmonary Rehab  Date  10/24/16  Educator  Sb  Instruction Review Code  2- meets goals/outcomes      Infection Prevention: - Provides verbal and written material to individual with discussion of infection control including proper hand washing and proper equipment cleaning during exercise session. Flowsheet Row Pulmonary Rehab from 10/24/2016 in [facility] Cardiac and Pulmonary Rehab  Date  10/24/16  Educator  Sb  Instruction Review  Code  2- meets goals/outcomes      Falls Prevention: - Provides verbal and written material to individual with discussion of falls prevention and safety. Flowsheet Row Pulmonary Rehab from 10/24/2016 in [facility] Cardiac and Pulmonary Rehab  Date  10/24/16  Educator  Sb  Instruction Review Code  2- meets goals/outcomes      Diabetes: - Individual verbal and written instruction to review signs/symptoms of diabetes, desired ranges of glucose level fasting, after meals and with exercise. Advice that pre and post exercise glucose checks will be done for 3 sessions at entry of program.   Chronic Lung Diseases: - Group verbal and written instruction to review new updates, new respiratory medications, new advancements in procedures and treatments. Provide informative websites and "800" numbers of self-education.   Lung Procedures: - Group verbal and written instruction to describe testing methods done to diagnose lung disease. Review the outcome of test results. Describe the treatment choices: Pulmonary Function Tests, ABGs and oximetry.   Energy Conservation: - Provide group verbal and written instruction for methods to conserve energy, plan and  organize activities. Instruct on pacing techniques, use of adaptive equipment and posture/positioning to relieve shortness of breath.   Triggers: - Group verbal and written instruction to review types of environmental controls: home humidity, furnaces, filters, dust mite/pet prevention, HEPA vacuums. To discuss weather changes, air quality and the benefits of nasal washing.   Exacerbations: - Group verbal and written instruction to provide: warning signs, infection symptoms, calling MD promptly, preventive modes, and value of vaccinations. Review: effective airway clearance, coughing and/or vibration techniques. Create an Sports administrator.   Oxygen: - Individual and group verbal and written instruction on oxygen therapy. Includes supplement oxygen, available portable oxygen systems, continuous and intermittent flow rates, oxygen safety, concentrators, and Medicare reimbursement for oxygen.   Respiratory Medications: - Group verbal and written instruction to review medications for lung disease. Drug class, frequency, complications, importance of spacers, rinsing mouth after steroid MDI's, and proper cleaning methods for nebulizers.   AED/CPR: - Group verbal and written instruction with the use of models to demonstrate the basic use of the AED with the basic ABC's of resuscitation.   Breathing Retraining: - Provides individuals verbal and written instruction on purpose, frequency, and proper technique of diaphragmatic breathing and pursed-lipped breathing. Applies individual practice skills.   Anatomy and Physiology of the Lungs: - Group verbal and written instruction with the use of models to provide basic lung anatomy and physiology related to function, structure and complications of lung disease.   Heart Failure: - Group verbal and written instruction on the basics of heart failure: signs/symptoms, treatments, explanation of ejection fraction, enlarged heart and cardiomyopathy.   Sleep  Apnea: - Individual verbal and written instruction to review Obstructive Sleep Apnea. Review of risk factors, methods for diagnosing and types of masks and machines for OSA.   Anxiety: - Provides group, verbal and written instruction on the correlation between heart/lung disease and anxiety, treatment options, and management of anxiety.   Relaxation: - Provides group, verbal and written instruction about the benefits of relaxation for patients with heart/lung disease. Also provides patients with examples of relaxation techniques.   Knowledge Questionnaire Score:     Knowledge Questionnaire Score - 10/24/16 1211      Knowledge Questionnaire Score   Pre Score 10/10       Core Components/Risk Factors/Patient Goals at Admission:     Personal Goals and Risk Factors at Admission - 10/24/16 1207  Core Components/Risk Factors/Patient Goals on Admission    Weight Management Yes;Weight Maintenance   Admit Weight 207 lb 11.2 oz (94.2 kg)   Goal Weight: Short Term 204 lb (92.5 kg)   Goal Weight: Long Term 180 lb (81.6 kg)   Expected Outcomes Short Term: Continue to assess and modify interventions until short term weight is achieved;Long Term: Adherence to nutrition and physical activity/exercise program aimed toward attainment of established weight goal;Weight Loss: Understanding of general recommendations for a balanced deficit meal plan, which promotes 1-2 lb weight loss per week and includes a negative energy balance of [phone] kcal/d   Sedentary Yes   Intervention Provide advice, education, support and counseling about physical activity/exercise needs.;Develop an individualized exercise prescription for aerobic and resistive training based on initial evaluation findings, risk stratification, comorbidities and participant's personal goals.   Expected Outcomes Achievement of increased cardiorespiratory fitness and enhanced flexibility, muscular endurance and strength shown through  measurements of functional capacity and personal statement of participant.   Increase Strength and Stamina Yes   Intervention Provide advice, education, support and counseling about physical activity/exercise needs.;Develop an individualized exercise prescription for aerobic and resistive training based on initial evaluation findings, risk stratification, comorbidities and participant's personal goals.   Expected Outcomes Achievement of increased cardiorespiratory fitness and enhanced flexibility, muscular endurance and strength shown through measurements of functional capacity and personal statement of participant.   Improve shortness of breath with ADL's Yes   Intervention Provide education, individualized exercise plan and daily activity instruction to help decrease symptoms of SOB with activities of daily living.   Expected Outcomes Short Term: Achieves a reduction of symptoms when performing activities of daily living.   Develop more efficient breathing techniques such as purse lipped breathing and diaphragmatic breathing; and practicing self-pacing with activity Yes   Intervention Provide education, demonstration and support about specific breathing techniuqes utilized for more efficient breathing. Include techniques such as pursed lipped breathing, diaphragmatic breathing and self-pacing activity.   Expected Outcomes Short Term: Participant will be able to demonstrate and use breathing techniques as needed throughout daily activities.   Increase knowledge of respiratory medications and ability to use respiratory devices properly  Yes   Intervention Provide education and demonstration as needed of appropriate use of medications, inhalers, and oxygen therapy.   Expected Outcomes Short Term: Achieves understanding of medications use. Understands that oxygen is a medication prescribed by physician. Demonstrates appropriate use of inhaler and oxygen therapy.   Hypertension Yes   Intervention Provide  education on lifestyle modifcations including regular physical activity/exercise, weight management, moderate sodium restriction and increased consumption of fresh fruit, vegetables, and low fat dairy, alcohol moderation, and smoking cessation.;Monitor prescription use compliance.   Expected Outcomes Short Term: Continued assessment and intervention until BP is < 140/60m HG in hypertensive participants. < 130/814mHG in hypertensive participants with diabetes, heart failure or chronic kidney disease.;Long Term: Maintenance of blood pressure at goal levels.   Lipids Yes   Intervention Provide education and support for participant on nutrition & aerobic/resistive exercise along with prescribed medications to achieve LDL '70mg'$ , HDL >'40mg'$ .   Expected Outcomes Short Term: Participant states understanding of desired cholesterol values and is compliant with medications prescribed. Participant is following exercise prescription and nutrition guidelines.;Long Term: Cholesterol controlled with medications as prescribed, with individualized exercise RX and with personalized nutrition plan. Value goals: LDL < '70mg'$ , HDL > 40 mg.      Core Components/Risk Factors/Patient Goals Review:    Core Components/Risk Factors/Patient Goals at  Discharge (Final Review):    ITP Comments:     ITP Comments    Row Name 10/24/16 1155           ITP Comments Initial medical review completed with ITP created. Documentation of diagnosis in [facility] Office Encounter 09/28/2016          Comments: Mr Dion plans to start [facility] on 10/30/16 and attended 3 days/week.

## 2016-10-27 DIAGNOSIS — I480 Paroxysmal atrial fibrillation: Secondary | ICD-10-CM | POA: Diagnosis not present

## 2016-10-27 DIAGNOSIS — I10 Essential (primary) hypertension: Secondary | ICD-10-CM | POA: Diagnosis not present

## 2016-10-27 DIAGNOSIS — E782 Mixed hyperlipidemia: Secondary | ICD-10-CM | POA: Diagnosis not present

## 2016-10-27 DIAGNOSIS — J42 Unspecified chronic bronchitis: Secondary | ICD-10-CM | POA: Diagnosis not present

## 2016-10-27 DIAGNOSIS — I2510 Atherosclerotic heart disease of native coronary artery without angina pectoris: Secondary | ICD-10-CM | POA: Diagnosis not present

## 2016-10-30 ENCOUNTER — Encounter: Payer: Medicare Other | Admitting: *Deleted

## 2016-10-30 DIAGNOSIS — R0609 Other forms of dyspnea: Secondary | ICD-10-CM | POA: Diagnosis not present

## 2016-10-30 DIAGNOSIS — R05 Cough: Secondary | ICD-10-CM | POA: Diagnosis not present

## 2016-10-30 DIAGNOSIS — J449 Chronic obstructive pulmonary disease, unspecified: Secondary | ICD-10-CM

## 2016-10-30 NOTE — Progress Notes (Signed)
Daily Session Note  Patient Details  Name: James ICKES Sr. MRN: [id number] Date of Birth: [birth year] Referring Provider:   Flowsheet Row Pulmonary Rehab from 10/24/2016 in [facility] Cardiac and Pulmonary Rehab  Referring Provider  Ramachandran      Encounter Date: 10/30/2016  Check In:     Session Check In - 10/30/16 1246      Check-In   Location [facility]-Cardiac & Pulmonary Rehab   Staff Present Gerlene Burdock, RN, Moises Blood, BS, ACSM CEP, Exercise Physiologist;Amanda Oletta Darter, [place], ACSM CEP, Exercise Physiologist   Supervising physician immediately available to respond to emergencies [facility] immediately available ER MD   Physician(s) Dr. Cinda Quest and Dr. Joni Fears   Medication changes reported     No   Fall or balance concerns reported    No   Warm-up and Cool-down Performed as group-led instruction   Resistance Training Performed Yes   VAD Patient? No     Pain Assessment   Currently in Pain? No/denies         Goals Met:  Proper associated with RPD/PD & O2 Sat Exercise tolerated well  Goals Unmet:  Not Applicable  Comments:     Dr. Emily Filbert is Medical Director for [place] and [facility] Rehabilitation.

## 2016-10-30 NOTE — Progress Notes (Signed)
Daily Session Note  Patient Details  Name: James K Veracruz Sr. MRN: [id number] Date of Birth: 05/28/1929 Referring Provider:   Flowsheet Row Pulmonary Rehab from 10/24/2016 in [facility]  Referring Provider  Ramachandran      Encounter Date: 10/30/2016  Check In:     Session Check In - 10/30/16 1246      Check-In   Location [facility]   Staff Present Christy Friede, RN, BSN;Kelly Hayes, BS, ACSM CEP, Exercise Physiologist;Amanda Sommer, BA, ACSM CEP, Exercise Physiologist   Supervising physician immediately available to respond to emergencies [facility] immediately available ER MD   Physician(s) Dr. Malinda and Dr. Stafford   Medication changes reported     No   Fall or balance concerns reported    No   Warm-up and Cool-down Performed as group-led instruction   Resistance Training Performed Yes   VAD Patient? No     Pain Assessment   Currently in Pain? No/denies           Exercise Prescription Changes - 10/30/16 1200      Response to Exercise   Blood Pressure (Admit) 140/60   Blood Pressure (Exercise) 132/68   Heart Rate (Admit) 67 bpm   Heart Rate (Exercise) 75 bpm   Oxygen Saturation (Admit) 97 %   Oxygen Saturation (Exercise) 95 %  Room air   Rating of Perceived Exertion (Exercise) 11   Perceived Dyspnea (Exercise) 3   Symptoms --  new knee pain which felt better with exercise.     Resistance Training   Training Prescription Yes   Weight 2   Reps 10-12     Treadmill   MPH 1.4   Grade 0   Minutes 15  Did alternate equipment due to knee pain will cont to assess   METs 2.07     NuStep   Level 2   Minutes 15   METs 2     T5 Nustep   Level 1   Minutes 15   METs 2      Goals Met:  Proper associated with RPD/PD & O2 Sat Exercise tolerated well  Goals Unmet:  Not Applicable  Comments: First full day of exercise!  Patient was oriented to gym and equipment including functions, settings, policies, and  procedures.  Patient's individual exercise prescription and treatment plan were reviewed.  All starting workloads were established based on the results of the 6 minute walk test done at initial orientation visit.  The plan for exercise progression was also introduced and progression will be customized based on patient's performance and goals.    Dr. Mark Miller is Medical Director for [facility] and [facility]. 

## 2016-10-30 NOTE — Progress Notes (Signed)
Daily Session Note  Patient Details  Name: James SIMIEN Sr. MRN: [id number] Date of Birth: 05/26/1929 Referring Provider:   Flowsheet Row Pulmonary Rehab from 10/24/2016 in [facility]  Referring Provider  Ramachandran      Encounter Date: 10/30/2016  Check In:     Session Check In - 10/30/16 1246      Check-In   Location [facility]   Staff Present Gerlene Burdock, RN, Moises Blood, BS, ACSM CEP, Exercise Physiologist;Amanda Oletta Darter, [place], ACSM CEP, Exercise Physiologist   Supervising physician immediately available to respond to emergencies [facility] immediately available ER MD   Physician(s) Dr. Cinda Quest and Dr. Joni Fears   Medication changes reported     No   Fall or balance concerns reported    No   Warm-up and Cool-down Performed as group-led instruction   Resistance Training Performed Yes   VAD Patient? No     Pain Assessment   Currently in Pain? No/denies           Exercise Prescription Changes - 10/30/16 1200      Response to Exercise   Blood Pressure (Admit) 140/60   Blood Pressure (Exercise) 132/68   Heart Rate (Admit) 67 bpm   Heart Rate (Exercise) 75 bpm   Oxygen Saturation (Admit) 97 %   Oxygen Saturation (Exercise) 95 %  Room air   Rating of Perceived Exertion (Exercise) 11   Perceived Dyspnea (Exercise) 3   Symptoms --  new knee pain which felt better with exercise.     Resistance Training   Training Prescription Yes   Weight 2   Reps 10-12     Treadmill   MPH 1.4   Grade 0   Minutes 15  Did alternate equipment due to knee pain will cont to assess   METs 2.07     NuStep   Level 2   Minutes 15   METs 2     T5 Nustep   Level 1   Minutes 15   METs 2      Goals Met:  Proper associated with RPD/PD & O2 Sat Exercise tolerated well  Goals Unmet:  Not Applicable  Comments: First full day of exercise!  Patient was oriented to gym and equipment including functions, settings, policies, and  procedures.  Patient's individual exercise prescription and treatment plan were reviewed.  All starting workloads were established based on the results of the 6 minute walk test done at initial orientation visit.  The plan for exercise progression was also introduced and progression will be customized based on patient's performance and goals.    Dr. Emily Filbert is Medical Director for [place] and [facility].

## 2016-11-06 ENCOUNTER — Other Ambulatory Visit: Payer: Self-pay

## 2016-11-06 DIAGNOSIS — R05 Cough: Secondary | ICD-10-CM | POA: Diagnosis not present

## 2016-11-06 DIAGNOSIS — J449 Chronic obstructive pulmonary disease, unspecified: Secondary | ICD-10-CM

## 2016-11-06 MED ORDER — TAMSULOSIN HCL 0.4 MG PO CAPS: 0.4000 mg | ORAL_CAPSULE | Freq: Every day | ORAL | 4 refills | Status: DC

## 2016-11-06 NOTE — Progress Notes (Signed)
Daily Session Note  Patient Details  Name: James [name] Sr. MRN: [id number] Date of Birth: 10-28-1929 Referring Provider:   Flowsheet Row Pulmonary Rehab from 10/24/2016 in [facility]  Referring Provider  Ramachandran      Encounter Date: 11/06/2016  Check In:     Session Check In - 11/06/16 1300      Check-In   Location [facility]   Staff Present Carson Myrtle, BS, RRT, Respiratory Therapist;Kelly Amedeo Plenty, BS, ACSM CEP, Exercise Physiologist;Raneen Jaffer Oletta Darter, BA, ACSM CEP, Exercise Physiologist   Supervising physician immediately available to respond to emergencies [facility] immediately available ER MD   Physician(s) Cinda Quest and [place]   Medication changes reported     No   Fall or balance concerns reported    No   Warm-up and Cool-down Performed as group-led instruction   Resistance Training Performed Yes     Pain Assessment   Currently in Pain? No/denies   Multiple Pain Sites No         Goals Met:  Proper associated with RPD/PD & O2 Sat Independence with exercise equipment Exercise tolerated well Strength training completed today  Goals Unmet:  Not Applicable  Comments: Pt able to follow exercise prescription today without complaint.  Will continue to monitor for progression.    Dr. Emily Filbert is Medical Director for [facility] and [facility].

## 2016-11-06 NOTE — Telephone Encounter (Signed)
Patient sent email requesting refill to mail order pharmacy. Thanks!

## 2016-11-08 ENCOUNTER — Encounter: Payer: Medicare Other | Admitting: *Deleted

## 2016-11-08 DIAGNOSIS — R05 Cough: Secondary | ICD-10-CM | POA: Diagnosis not present

## 2016-11-08 DIAGNOSIS — J449 Chronic obstructive pulmonary disease, unspecified: Secondary | ICD-10-CM

## 2016-11-08 NOTE — Progress Notes (Signed)
Daily Session Note  Patient Details  Name: James MICHALSKI Sr. MRN: [id number] Date of Birth: 1929/01/20 Referring Provider:   Flowsheet Row Pulmonary Rehab from 10/24/2016 in [facility]  Referring Provider  Ramachandran      Encounter Date: 11/08/2016  Check In:     Session Check In - 11/08/16 1246      Check-In   Location [facility]   Staff Present Carson Myrtle, BS, RRT, Respiratory Lennie Hummer, MA, ACSM RCEP, Exercise Physiologist;Other   Supervising physician immediately available to respond to emergencies [facility] immediately available ER MD   Physician(s) Drs. Marcelene Butte and Alfred Levins   Medication changes reported     No   Fall or balance concerns reported    No   Warm-up and Cool-down Performed as group-led Location manager Performed Yes   VAD Patient? No     Pain Assessment   Currently in Pain? No/denies   Multiple Pain Sites No           Exercise Prescription Changes - 11/07/16 1600      Exercise Review   Progression Yes     Response to Exercise   Blood Pressure (Admit) 136/70   Blood Pressure (Exercise) 110/72   Blood Pressure (Exit) 122/80   Heart Rate (Admit) 70 bpm   Heart Rate (Exercise) 92 bpm   Heart Rate (Exit) 82 bpm   Oxygen Saturation (Admit) 92 %   Oxygen Saturation (Exercise) 92 %   Oxygen Saturation (Exit) 95 %   Rating of Perceived Exertion (Exercise) 13   Perceived Dyspnea (Exercise) 4   Duration Progress to 45 minutes of aerobic exercise without signs/symptoms of physical distress   Intensity THRR unchanged     Progression   Progression Continue to progress workloads to maintain intensity without signs/symptoms of physical distress.   Average METs 1.99     Resistance Training   Training Prescription Yes   Weight 2 lbs   Reps 10-12     Interval Training   Interval Training No     Treadmill   MPH 1   Grade 0   Minutes 15   METs 1.77     NuStep   Level  2   Minutes 15   METs 2.2      Goals Met:  Proper associated with RPD/PD & O2 Sat Independence with exercise equipment Using PLB without cueing & demonstrates good technique Exercise tolerated well Personal goals reviewed No report of cardiac concerns or symptoms Strength training completed today  Goals Unmet:  Not Applicable  Comments: Pt able to follow exercise prescription today without complaint.  Will continue to monitor for progression.    Dr. Emily Filbert is Medical Director for [place] and [facility].

## 2016-11-13 ENCOUNTER — Other Ambulatory Visit: Payer: Self-pay | Admitting: *Deleted

## 2016-11-13 ENCOUNTER — Encounter: Payer: Self-pay | Admitting: *Deleted

## 2016-11-13 DIAGNOSIS — J449 Chronic obstructive pulmonary disease, unspecified: Secondary | ICD-10-CM

## 2016-11-13 DIAGNOSIS — R05 Cough: Secondary | ICD-10-CM | POA: Diagnosis not present

## 2016-11-13 MED ORDER — FLUTICASONE PROPIONATE 50 MCG/ACT NA SUSP: 2.0000 | Freq: Every day | NASAL | 5 refills | Status: DC

## 2016-11-13 NOTE — Progress Notes (Signed)
Pulmonary Individual Treatment Plan  Patient Details  Name: James POLLACK Sr. MRN: [id number] Date of Birth: Oct 04, 1929 Referring Provider:   Flowsheet Row Pulmonary Rehab from 10/24/2016 in [facility] Cardiac and Pulmonary Rehab  Referring Provider  Ramachandran      Initial Encounter Date:  Flowsheet Row Pulmonary Rehab from 10/24/2016 in [facility] Cardiac and Pulmonary Rehab  Date  10/24/16  Referring Provider  Ashby Dawes      Visit Diagnosis: COPD, mild ([place])  Patient's Home Medications on Admission:  Current Outpatient Prescriptions:  .  albuterol (PROAIR HFA) 108 (90 Base) MCG/ACT inhaler, Inhale 2 puffs into the lungs every 6 (six) hours as needed for wheezing or shortness of breath., Disp: 1 Inhaler, Rfl: 2 .  aspirin EC 325 MG tablet, Take by mouth., Disp: , Rfl:  .  atorvastatin (LIPITOR) 80 MG tablet, Take 1 tablet (80 mg total) by mouth daily., Disp: 90 tablet, Rfl: 4 .  azelastine (OPTIVAR) 0.05 % ophthalmic solution, Apply 1 drop to eye 2 (two) times daily., Disp: 6 mL, Rfl: 5 .  benazepril-hydrochlorthiazide (LOTENSIN HCT) 20-12.5 MG tablet, Take 1 tablet by mouth daily., Disp: 90 tablet, Rfl: 1 .  beta carotene w/minerals (OCUVITE) tablet, Take 1 tablet by mouth daily., Disp: , Rfl:  .  budesonide-formoterol (SYMBICORT) 160-4.5 MCG/ACT inhaler, Inhale 2 puffs into the lungs 2 (two) times daily., Disp: 1 Inhaler, Rfl: 12 .  fluticasone (FLONASE) 50 MCG/ACT nasal spray, Place 2 sprays into both nostrils daily., Disp: 48 g, Rfl: 3 .  furosemide (LASIX) 20 MG tablet, Take 1 tablet (20 mg total) by mouth daily., Disp: 30 tablet, Rfl: 0 .  HYDROcodone-acetaminophen (NORCO/VICODIN) 5-325 MG tablet, Take 1 tablet by mouth 2 (two) times daily as needed., Disp: 30 tablet, Rfl: 0 .  lansoprazole (PREVACID) 30 MG capsule, TAKE 1 CAPSULE EVERY DAY, Disp: 30 capsule, Rfl: 6 .  LORazepam (ATIVAN) 1 MG tablet, Take 1 tablet (1 mg total) by mouth at bedtime as needed for anxiety., Disp: 30  tablet, Rfl: 5 .  Magnesium 400 MG CAPS, Take 1 capsule by mouth daily. , Disp: , Rfl:  .  metoprolol (LOPRESSOR) 50 MG tablet, Take 1 tablet (50 mg total) by mouth 2 (two) times daily., Disp: 180 tablet, Rfl: 4 .  montelukast (SINGULAIR) 10 MG tablet, TAKE 1 TABLET BY MOUTH DAILY, Disp: 30 tablet, Rfl: 12 .  MULTIPLE VITAMIN PO, Take 1 tablet by mouth daily. , Disp: , Rfl:  .  naproxen (NAPROSYN) 500 MG tablet, Take 500 mg by mouth 2 (two) times daily as needed. , Disp: , Rfl:  .  OMEGA-3 FATTY ACIDS PO, Take 1,000 mg by mouth daily. , Disp: , Rfl:  .  SPIRIVA HANDIHALER 18 MCG inhalation capsule, INHALE 1 CAPSULE AS DIRECTED ONCE A DAY, Disp: 30 capsule, Rfl: 5 .  tamsulosin (FLOMAX) 0.4 MG CAPS capsule, Take 1 capsule (0.4 mg total) by mouth daily., Disp: 90 capsule, Rfl: 4 .  traMADol (ULTRAM) 50 MG tablet, Take 50 mg by mouth 2 (two) times daily. , Disp: , Rfl:   Past Medical History: Past Medical History:  Diagnosis Date  . Back pain   . CAD (coronary artery disease)   . Diverticulosis   . GERD (gastroesophageal reflux disease)   . History of chicken pox   . History of measles   . History of mumps   . Hyperglycemia   . Hyperlipidemia   . Hypertension   . Hypotension   . Prostate cancer ([place])   .  TIA (transient ischemic attack)     Tobacco Use: History  Smoking Status  . Former Smoker  . Packs/day: 1.00  . Years: 11.00  . Types: Cigarettes  . Quit date: 12/18/1978  Smokeless Tobacco  . Never Used    Labs: Recent Review Flowsheet Data    Labs for ITP Cardiac and Pulmonary Rehab Latest Ref Rng & Units 10/05/2014 10/07/2014   Cholestrol 0 - 200 mg/dL - 104   LDLCALC mg/dL - 40   HDL 35 - 70 mg/dL - 52   Trlycerides 40 - 160 mg/dL - 61   Hemoglobin A1c 4.0 - 6.0 % 5.7 5.7       ADL UCSD:     Pulmonary Assessment Scores    Row Name 10/24/16 1213         ADL UCSD   ADL Phase Entry     SOB Score total 29     Rest 1     Walk 2     Stairs 3     Bath 3      Dress 2     Shop 1        Pulmonary Function Assessment:     Pulmonary Function Assessment - 10/24/16 1212      Initial Spirometry Results   FVC% 37 %   FEV1% 47 %   FEV1/FVC Ratio 87.3     Breath   Bilateral Breath Sounds Clear   Shortness of Breath Yes;Limiting activity      Exercise Target Goals:    Exercise Program Goal: Individual exercise prescription set with THRR, safety & activity barriers. Participant demonstrates ability to understand and report RPE using BORG scale, to self-measure pulse accurately, and to acknowledge the importance of the exercise prescription.  Exercise Prescription Goal: Starting with aerobic activity 30 plus minutes a day, 3 days per week for initial exercise prescription. Provide home exercise prescription and guidelines that participant acknowledges understanding prior to discharge.  Activity Barriers & Risk Stratification:     Activity Barriers & Cardiac Risk Stratification - 10/24/16 1211      Activity Barriers & Cardiac Risk Stratification   Activity Barriers Back Problems;Arthritis;Shortness of Breath;Deconditioning      6 Minute Walk:     6 Minute Walk    Row Name 10/24/16 1258         6 Minute Walk   Distance 800 feet     Walk Time 6 minutes     # of Rest Breaks 0     MPH 1.5     METS 2.15     RPE 17     Perceived Dyspnea  4     VO2 Peak 1.9     Symptoms Yes (comment)     Comments left hip pain more than breathing     Resting HR 67 bpm     Resting BP 126/62     Max Ex. HR 110 bpm     Max Ex. BP 150/70       Interval HR   Baseline HR 67     1 Minute HR 92     2 Minute HR 99     3 Minute HR 96     4 Minute HR 110     5 Minute HR 99     6 Minute HR 100     2 Minute Post HR 81     Interval Heart Rate? Yes       Interval Oxygen   Interval  Oxygen? Yes     Baseline Oxygen Saturation % 96 %     1 Minute Oxygen Saturation % 92 %     2 Minute Oxygen Saturation % 92 %     3 Minute Oxygen Saturation % 91 %      4 Minute Oxygen Saturation % 93 %     5 Minute Oxygen Saturation % 91 %     6 Minute Oxygen Saturation % 93 %     2 Minute Post Oxygen Saturation % 96 %        Initial Exercise Prescription:     Initial Exercise Prescription - 10/24/16 1300      Date of Initial Exercise RX and Referring Provider   Date 10/24/16   Referring Provider Ashby Dawes     Treadmill   MPH 1.4   Grade 0   Minutes 15  5/5/5   METs 2.07     NuStep   Level 2   Minutes 15   METs 2     T5 Nustep   Level 1   Minutes 15   METs 2     Intensity   THRR 40-80% of Max Heartrate 94-121   Ratings of Perceived Exertion 11-13   Perceived Dyspnea 0-4     Progression   Progression Continue to progress workloads to maintain intensity without signs/symptoms of physical distress.     Resistance Training   Training Prescription Yes   Weight 2   Reps 10-12      Perform Capillary Blood Glucose checks as needed.  Exercise Prescription Changes:     Exercise Prescription Changes    Row Name 10/30/16 1200 11/07/16 1600           Exercise Review   Progression  - Yes        Response to Exercise   Blood Pressure (Admit) 140/60 136/70      Blood Pressure (Exercise) 132/68 110/72      Blood Pressure (Exit)  - 122/80      Heart Rate (Admit) 67 bpm 70 bpm      Heart Rate (Exercise) 75 bpm 92 bpm      Heart Rate (Exit)  - 82 bpm      Oxygen Saturation (Admit) 97 % 92 %      Oxygen Saturation (Exercise) 95 %  Room air 92 %      Oxygen Saturation (Exit)  - 95 %      Rating of Perceived Exertion (Exercise) 11 13      Perceived Dyspnea (Exercise) 3 4      Symptoms -  new knee pain which felt better with exercise.  -      Duration  - Progress to 45 minutes of aerobic exercise without signs/symptoms of physical distress      Intensity  - THRR unchanged        Progression   Progression  - Continue to progress workloads to maintain intensity without signs/symptoms of physical distress.      Average  METs  - 1.99        Resistance Training   Training Prescription Yes Yes      Weight 2 2 lbs      Reps 10-12 10-12        Interval Training   Interval Training  - No        Treadmill   MPH 1.4 1      Grade 0 0      Minutes 15  Did alternate equipment due to knee pain will cont to assess 15      METs 2.07 1.77        NuStep   Level 2 2      Minutes 15 15      METs 2 2.2        T5 Nustep   Level 1  -      Minutes 15  -      METs 2  -         Exercise Comments:     Exercise Comments    Row Name 11/07/16 1620           Exercise Comments Camran had a great first day on 11/13.  He is doing well.  We will continue to monitor his progression.          Discharge Exercise Prescription (Final Exercise Prescription Changes):     Exercise Prescription Changes - 11/07/16 1600      Exercise Review   Progression Yes     Response to Exercise   Blood Pressure (Admit) 136/70   Blood Pressure (Exercise) 110/72   Blood Pressure (Exit) 122/80   Heart Rate (Admit) 70 bpm   Heart Rate (Exercise) 92 bpm   Heart Rate (Exit) 82 bpm   Oxygen Saturation (Admit) 92 %   Oxygen Saturation (Exercise) 92 %   Oxygen Saturation (Exit) 95 %   Rating of Perceived Exertion (Exercise) 13   Perceived Dyspnea (Exercise) 4   Duration Progress to 45 minutes of aerobic exercise without signs/symptoms of physical distress   Intensity THRR unchanged     Progression   Progression Continue to progress workloads to maintain intensity without signs/symptoms of physical distress.   Average METs 1.99     Resistance Training   Training Prescription Yes   Weight 2 lbs   Reps 10-12     Interval Training   Interval Training No     Treadmill   MPH 1   Grade 0   Minutes 15   METs 1.77     NuStep   Level 2   Minutes 15   METs 2.2       Nutrition:  Target Goals: Understanding of nutrition guidelines, daily intake of sodium <1579m, cholesterol <2047m calories 30% from fat and 7% or less  from saturated fats, daily to have 5 or more servings of fruits and vegetables.  Biometrics:     Pre Biometrics - 10/24/16 1257      Pre Biometrics   Height '5\' 5"'  (1.651 m)   Weight 207 lb 11.2 oz (94.2 kg)   Waist Circumference 44.25 inches   Hip Circumference 44.25 inches   Waist to Hip Ratio 1 %   BMI (Calculated) 34.6       Nutrition Therapy Plan and Nutrition Goals:     Nutrition Therapy & Goals - 10/24/16 1207      Intervention Plan   Intervention Prescribe, educate and counsel regarding individualized specific dietary modifications aiming towards targeted core components such as weight, hypertension, lipid management, diabetes, heart failure and other comorbidities.   Expected Outcomes Short Term Goal: Understand basic principles of dietary content, such as calories, fat, sodium, cholesterol and nutrients.;Short Term Goal: A plan has been developed with personal nutrition goals set during dietitian appointment.;Long Term Goal: Adherence to prescribed nutrition plan.      Nutrition Discharge: Rate Your Plate Scores:     Nutrition Assessments - 10/30/16 1554  Rate Your Plate Scores   Pre Score 60   Pre Score % 66.7 %      Psychosocial: Target Goals: Acknowledge presence or absence of depression, maximize coping skills, provide positive support system. Participant is able to verbalize types and ability to use techniques and skills needed for reducing stress and depression.  Initial Review & Psychosocial Screening:     Initial Psych Review & Screening - 10/24/16 1209      Initial Review   Current issues with --  No concerns noted     Family Dynamics   Good Support System? Yes  Wife and neighbors     Barriers   Psychosocial barriers to participate in program There are no identifiable barriers or psychosocial needs.;The patient should benefit from training in stress management and relaxation.     Screening Interventions   Interventions Encouraged to  exercise      Quality of Life Scores:     Quality of Life - 10/24/16 1218      Quality of Life Scores   Health/Function Pre 19.81 %   Socioeconomic Pre 21 %   Psych/Spiritual Pre 21 %   Family Pre 21 %   GLOBAL Pre 20.46 %      PHQ-9: Recent Review Flowsheet Data    Depression screen [facility] 2/9 10/24/2016 12/03/2015   Decreased Interest 0 0   Down, Depressed, Hopeless 0 0   PHQ - 2 Score 0 0   Altered sleeping 1 -   Tired, decreased energy 2 -   Change in appetite 0 -   Feeling bad or failure about yourself  2  -   Trouble concentrating 1 -   Moving slowly or fidgety/restless 0 -   Suicidal thoughts 0 -   PHQ-9 Score 6 -   Difficult doing work/chores Somewhat difficult -      Psychosocial Evaluation and Intervention:     Psychosocial Evaluation - 11/06/16 1226      Psychosocial Evaluation & Interventions   Interventions Encouraged to exercise with the program and follow exercise prescription   Comments Counselor met with Mr. B today for initial psychosocial evaluation.  He is a well-adjusted 82 year old who has been diagnosed with mild COPD.  He has a wife of 65 years and is actively involved in his local church.  He has had CABGx5 in 2011 and hip replacement in 1997.  He also is in remission from Prostate Cancer in 2014.  Mr. B states he has "memory issues" as well., although he may have had some difficulty recalling information; it seemed more related to his lack of ability to hear - as he forgot his hearing aid today.  Mr. B states his sleep varies and his appetite is "too good."  He denies a history or current symptoms of depression or anxiety and states he is a positive person most of the time.  Mr. B has minimal stress in his life other than his health.  He reports his spouse walks (3) miles per day and he may join her once he completes this program.  His goals while here are to breathe better; increase his energy and possible lose some weight.        Psychosocial  Re-Evaluation:  Education: Education Goals: Education classes will be provided on a weekly basis, covering required topics. Participant will state understanding/return demonstration of topics presented.  Learning Barriers/Preferences:     Learning Barriers/Preferences - 10/24/16 1211      Learning Barriers/Preferences  Learning Barriers Hearing   Learning Preferences None      Education Topics: Initial Evaluation Education: - Verbal, written and demonstration of respiratory meds, RPE/PD scales, oximetry and breathing techniques. Instruction on use of nebulizers and MDIs: cleaning and proper use, rinsing mouth with steroid doses and importance of monitoring MDI activations.   General Nutrition Guidelines/Fats and Fiber: -Group instruction provided by verbal, written material, models and posters to present the general guidelines for heart healthy nutrition. Gives an explanation and review of dietary fats and fiber. Flowsheet Row Pulmonary Rehab from 11/13/2016 in [facility] Cardiac and Pulmonary Rehab  Date  11/06/16  Educator  CR  Instruction Review Code  2- meets goals/outcomes      Controlling Sodium/Reading Food Labels: -Group verbal and written material supporting the discussion of sodium use in heart healthy nutrition. Review and explanation with models, verbal and written materials for utilization of the food label. Flowsheet Row Pulmonary Rehab from 11/13/2016 in [facility] Cardiac and Pulmonary Rehab  Date  11/13/16  Educator  CR  Instruction Review Code  2- meets goals/outcomes      Exercise Physiology & Risk Factors: - Group verbal and written instruction with models to review the exercise physiology of the cardiovascular system and associated critical values. Details cardiovascular disease risk factors and the goals associated with each risk factor.   Aerobic Exercise & Resistance Training: - Gives group verbal and written discussion on the health impact of inactivity. On  the components of aerobic and resistive training programs and the benefits of this training and how to safely progress through these programs.   Flexibility, Balance, General Exercise Guidelines: - Provides group verbal and written instruction on the benefits of flexibility and balance training programs. Provides general exercise guidelines with specific guidelines to those with heart or lung disease. Demonstration and skill practice provided.   Stress Management: - Provides group verbal and written instruction about the health risks of elevated stress, cause of high stress, and healthy ways to reduce stress.   Depression: - Provides group verbal and written instruction on the correlation between heart/lung disease and depressed mood, treatment options, and the stigmas associated with seeking treatment.   Exercise & Equipment Safety: - Individual verbal instruction and demonstration of equipment use and safety with use of the equipment. Flowsheet Row Pulmonary Rehab from 11/13/2016 in [facility] Cardiac and Pulmonary Rehab  Date  10/30/16  Educator  C. EnterkinRN  Instruction Review Code  2- meets goals/outcomes      Infection Prevention: - Provides verbal and written material to individual with discussion of infection control including proper hand washing and proper equipment cleaning during exercise session. Flowsheet Row Pulmonary Rehab from 11/13/2016 in [facility] Cardiac and Pulmonary Rehab  Date  10/30/16  Educator  C. EnterkinRN  Instruction Review Code  2- meets goals/outcomes      Falls Prevention: - Provides verbal and written material to individual with discussion of falls prevention and safety. Flowsheet Row Pulmonary Rehab from 11/13/2016 in [facility] Cardiac and Pulmonary Rehab  Date  10/30/16  Educator  C. Enterkin R.N.  Instruction Review Code  2- meets goals/outcomes      Diabetes: - Individual verbal and written instruction to review signs/symptoms of diabetes, desired  ranges of glucose level fasting, after meals and with exercise. Advice that pre and post exercise glucose checks will be done for 3 sessions at entry of program.   Chronic Lung Diseases: - Group verbal and written instruction to review new updates, new respiratory medications, new  advancements in procedures and treatments. Provide informative websites and "800" numbers of self-education.   Lung Procedures: - Group verbal and written instruction to describe testing methods done to diagnose lung disease. Review the outcome of test results. Describe the treatment choices: Pulmonary Function Tests, ABGs and oximetry.   Energy Conservation: - Provide group verbal and written instruction for methods to conserve energy, plan and organize activities. Instruct on pacing techniques, use of adaptive equipment and posture/positioning to relieve shortness of breath.   Triggers: - Group verbal and written instruction to review types of environmental controls: home humidity, furnaces, filters, dust mite/pet prevention, HEPA vacuums. To discuss weather changes, air quality and the benefits of nasal washing.   Exacerbations: - Group verbal and written instruction to provide: warning signs, infection symptoms, calling MD promptly, preventive modes, and value of vaccinations. Review: effective airway clearance, coughing and/or vibration techniques. Create an Sports administrator.   Oxygen: - Individual and group verbal and written instruction on oxygen therapy. Includes supplement oxygen, available portable oxygen systems, continuous and intermittent flow rates, oxygen safety, concentrators, and Medicare reimbursement for oxygen.   Respiratory Medications: - Group verbal and written instruction to review medications for lung disease. Drug class, frequency, complications, importance of spacers, rinsing mouth after steroid MDI's, and proper cleaning methods for nebulizers.   AED/CPR: - Group verbal and written  instruction with the use of models to demonstrate the basic use of the AED with the basic ABC's of resuscitation.   Breathing Retraining: - Provides individuals verbal and written instruction on purpose, frequency, and proper technique of diaphragmatic breathing and pursed-lipped breathing. Applies individual practice skills. Flowsheet Row Pulmonary Rehab from 11/13/2016 in [facility] Cardiac and Pulmonary Rehab  Date  10/30/16  Educator  Loletha Grayer ENterkinRN  Instruction Review Code  2- meets goals/outcomes      Anatomy and Physiology of the Lungs: - Group verbal and written instruction with the use of models to provide basic lung anatomy and physiology related to function, structure and complications of lung disease.   Heart Failure: - Group verbal and written instruction on the basics of heart failure: signs/symptoms, treatments, explanation of ejection fraction, enlarged heart and cardiomyopathy.   Sleep Apnea: - Individual verbal and written instruction to review Obstructive Sleep Apnea. Review of risk factors, methods for diagnosing and types of masks and machines for OSA.   Anxiety: - Provides group, verbal and written instruction on the correlation between heart/lung disease and anxiety, treatment options, and management of anxiety.   Relaxation: - Provides group, verbal and written instruction about the benefits of relaxation for patients with heart/lung disease. Also provides patients with examples of relaxation techniques.   Knowledge Questionnaire Score:     Knowledge Questionnaire Score - 10/24/16 1211      Knowledge Questionnaire Score   Pre Score 10/10       Core Components/Risk Factors/Patient Goals at Admission:     Personal Goals and Risk Factors at Admission - 10/24/16 1207      Core Components/Risk Factors/Patient Goals on Admission    Weight Management Yes;Weight Maintenance   Admit Weight 207 lb 11.2 oz (94.2 kg)   Goal Weight: Short Term 204 lb (92.5 kg)    Goal Weight: Long Term 180 lb (81.6 kg)   Expected Outcomes Short Term: Continue to assess and modify interventions until short term weight is achieved;Long Term: Adherence to nutrition and physical activity/exercise program aimed toward attainment of established weight goal;Weight Loss: Understanding of general recommendations for a balanced deficit meal  plan, which promotes 1-2 lb weight loss per week and includes a negative energy balance of [phone] kcal/d   Sedentary Yes   Intervention Provide advice, education, support and counseling about physical activity/exercise needs.;Develop an individualized exercise prescription for aerobic and resistive training based on initial evaluation findings, risk stratification, comorbidities and participant's personal goals.   Expected Outcomes Achievement of increased cardiorespiratory fitness and enhanced flexibility, muscular endurance and strength shown through measurements of functional capacity and personal statement of participant.   Increase Strength and Stamina Yes   Intervention Provide advice, education, support and counseling about physical activity/exercise needs.;Develop an individualized exercise prescription for aerobic and resistive training based on initial evaluation findings, risk stratification, comorbidities and participant's personal goals.   Expected Outcomes Achievement of increased cardiorespiratory fitness and enhanced flexibility, muscular endurance and strength shown through measurements of functional capacity and personal statement of participant.   Improve shortness of breath with ADL's Yes   Intervention Provide education, individualized exercise plan and daily activity instruction to help decrease symptoms of SOB with activities of daily living.   Expected Outcomes Short Term: Achieves a reduction of symptoms when performing activities of daily living.   Develop more efficient breathing techniques such as purse lipped breathing and  diaphragmatic breathing; and practicing self-pacing with activity Yes   Intervention Provide education, demonstration and support about specific breathing techniuqes utilized for more efficient breathing. Include techniques such as pursed lipped breathing, diaphragmatic breathing and self-pacing activity.   Expected Outcomes Short Term: Participant will be able to demonstrate and use breathing techniques as needed throughout daily activities.   Increase knowledge of respiratory medications and ability to use respiratory devices properly  Yes   Intervention Provide education and demonstration as needed of appropriate use of medications, inhalers, and oxygen therapy.   Expected Outcomes Short Term: Achieves understanding of medications use. Understands that oxygen is a medication prescribed by physician. Demonstrates appropriate use of inhaler and oxygen therapy.   Hypertension Yes   Intervention Provide education on lifestyle modifcations including regular physical activity/exercise, weight management, moderate sodium restriction and increased consumption of fresh fruit, vegetables, and low fat dairy, alcohol moderation, and smoking cessation.;Monitor prescription use compliance.   Expected Outcomes Short Term: Continued assessment and intervention until BP is < 140/21m HG in hypertensive participants. < 130/824mHG in hypertensive participants with diabetes, heart failure or chronic kidney disease.;Long Term: Maintenance of blood pressure at goal levels.   Lipids Yes   Intervention Provide education and support for participant on nutrition & aerobic/resistive exercise along with prescribed medications to achieve LDL <7086mHDL >30m19m Expected Outcomes Short Term: Participant states understanding of desired cholesterol values and is compliant with medications prescribed. Participant is following exercise prescription and nutrition guidelines.;Long Term: Cholesterol controlled with medications as  prescribed, with individualized exercise RX and with personalized nutrition plan. Value goals: LDL < 70mg46mL > 40 mg.      Core Components/Risk Factors/Patient Goals Review:      Goals and Risk Factor Review    Row Name 10/30/16 1254             Core Components/Risk Factors/Patient Goals Review   Personal Goals Review Develop more efficient breathing techniques such as purse lipped breathing and diaphragmatic breathing and practicing self-pacing with activity.       Review Cont to encourage pursed lip breathing       Expected Outcomes Able to do activities of daily living without shortness of breath  Core Components/Risk Factors/Patient Goals at Discharge (Final Review):      Goals and Risk Factor Review - 10/30/16 1254      Core Components/Risk Factors/Patient Goals Review   Personal Goals Review Develop more efficient breathing techniques such as purse lipped breathing and diaphragmatic breathing and practicing self-pacing with activity.   Review Cont to encourage pursed lip breathing   Expected Outcomes Able to do activities of daily living without shortness of breath      ITP Comments:     ITP Comments    Row Name 10/24/16 1155 10/30/16 1256         ITP Comments Initial medical review completed with ITP created. Documentation of diagnosis in [facility] Office Encounter 09/28/2016 First full day of exercise!  Patient was oriented to gym and equipment including functions, settings, policies, and procedures.  Patient's individual exercise prescription and treatment plan were reviewed.  All starting workloads were established based on the results of the 6 minute walk test done at initial orientation visit.  The plan for exercise progression was also introduced and progression will be customized based on patient's performance and goals.         Comments: 30 Day Review

## 2016-11-13 NOTE — Progress Notes (Signed)
Daily Session Note  Patient Details  Name: James TOLEN Sr. MRN: [id number] Date of Birth: 1929/05/10 Referring Provider:   Flowsheet Row Pulmonary Rehab from 10/24/2016 in [facility] Cardiac and Pulmonary Rehab  Referring Provider  Ramachandran      Encounter Date: 11/13/2016  Check In:     Session Check In - 11/13/16 1140      Check-In   Location [facility]-Cardiac & Pulmonary Rehab   Staff Present Carson Myrtle, BS, RRT, Respiratory Therapist;Kelly Amedeo Plenty, BS, ACSM CEP, Exercise Physiologist;Amanda Oletta Darter, BA, ACSM CEP, Exercise Physiologist   Supervising physician immediately available to respond to emergencies [facility] immediately available ER MD   Physician(s) Joni Fears and Mariea Clonts   Medication changes reported     No   Fall or balance concerns reported    No   Warm-up and Cool-down Performed as group-led instruction   Resistance Training Performed Yes   VAD Patient? No     Pain Assessment   Currently in Pain? No/denies   Multiple Pain Sites No         Goals Met:  Proper associated with RPD/PD & O2 Sat Independence with exercise equipment Exercise tolerated well Strength training completed today  Goals Unmet:  Not Applicable  Comments: Pt able to follow exercise prescription today without complaint.  Will continue to monitor for progression.    Dr. Emily Filbert is Medical Director for [place] and [facility].

## 2016-11-15 ENCOUNTER — Encounter: Payer: Medicare Other | Admitting: *Deleted

## 2016-11-15 DIAGNOSIS — J449 Chronic obstructive pulmonary disease, unspecified: Secondary | ICD-10-CM

## 2016-11-15 DIAGNOSIS — R05 Cough: Secondary | ICD-10-CM | POA: Diagnosis not present

## 2016-11-15 NOTE — Progress Notes (Signed)
Daily Session Note  Patient Details  Name: James LILL Sr. MRN: [id number] Date of Birth: 1929/03/26 Referring Provider:   Flowsheet Row Pulmonary Rehab from 10/24/2016 in [facility] Cardiac and Pulmonary Rehab  Referring Provider  Ramachandran      Encounter Date: 11/15/2016  Check In:     Session Check In - 11/15/16 1152      Check-In   Location [facility]-Cardiac & Pulmonary Rehab   Staff Present Carson Myrtle, BS, RRT, Respiratory Lennie Hummer, MA, ACSM RCEP, Exercise Physiologist   Supervising physician immediately available to respond to emergencies [facility] immediately available ER MD   Physician(s) Drs. Jimmye Norman and Lord   Medication changes reported     No   Fall or balance concerns reported    No   Warm-up and Cool-down Performed as group-led Location manager Performed Yes   VAD Patient? No     Pain Assessment   Currently in Pain? No/denies   Multiple Pain Sites No         Goals Met:  Proper associated with RPD/PD & O2 Sat Exercise tolerated well No report of cardiac concerns or symptoms Strength training completed today  Goals Unmet:  Not Applicable  Comments: Pt able to follow exercise prescription today without complaint.  Will continue to monitor for progression.    Dr. Emily Filbert is Medical Director for [place] and [facility].

## 2016-11-17 ENCOUNTER — Encounter: Payer: Medicare Other | Attending: Internal Medicine | Admitting: *Deleted

## 2016-11-17 DIAGNOSIS — R05 Cough: Secondary | ICD-10-CM | POA: Insufficient documentation

## 2016-11-17 DIAGNOSIS — R0609 Other forms of dyspnea: Secondary | ICD-10-CM | POA: Insufficient documentation

## 2016-11-17 DIAGNOSIS — J449 Chronic obstructive pulmonary disease, unspecified: Secondary | ICD-10-CM

## 2016-11-17 NOTE — Progress Notes (Signed)
Daily Session Note  Patient Details  Name: James [name] Sr. MRN: [id number] Date of Birth: May 21, 1929 Referring Provider:   Flowsheet Row Pulmonary Rehab from 10/24/2016 in [facility] Cardiac and Pulmonary Rehab  Referring Provider  Ramachandran      Encounter Date: 11/17/2016  Check In:     Session Check In - 11/17/16 1340      Check-In   Location [facility]-Cardiac & Pulmonary Rehab   Staff Present Alberteen Sam, MA, ACSM RCEP, Exercise Physiologist;Other   Supervising physician immediately available to respond to emergencies [facility] immediately available ER MD   Physician(s) Drs. Schevita and Veronese   Medication changes reported     No   Fall or balance concerns reported    No   Warm-up and Cool-down Performed as group-led Location manager Performed Yes   VAD Patient? No     Pain Assessment   Currently in Pain? No/denies   Multiple Pain Sites No         Goals Met:  Proper associated with RPD/PD & O2 Sat Independence with exercise equipment Using PLB without cueing & demonstrates good technique Exercise tolerated well Strength training completed today  Goals Unmet:  Not Applicable  Comments: Pt able to follow exercise prescription today without complaint.  Will continue to monitor for progression. Attended Know Your Numbers education class.    Dr. Emily Filbert is Medical Director for [place] and [facility].

## 2016-11-20 DIAGNOSIS — J449 Chronic obstructive pulmonary disease, unspecified: Secondary | ICD-10-CM

## 2016-11-20 DIAGNOSIS — R05 Cough: Secondary | ICD-10-CM | POA: Diagnosis not present

## 2016-11-20 NOTE — Progress Notes (Signed)
Daily Session Note  Patient Details  Name: James PILLSBURY Sr. MRN: [id number] Date of Birth: [birth year] Referring Provider:   Flowsheet Row Pulmonary Rehab from 10/24/2016 in [facility] Cardiac and Pulmonary Rehab  Referring Provider  Ramachandran      Encounter Date: 11/20/2016  Check In:     Session Check In - 11/20/16 1424      Check-In   Location [facility]-Cardiac & Pulmonary Rehab   Staff Present Carson Myrtle, BS, RRT, Respiratory Therapist;Kelly Amedeo Plenty, BS, ACSM CEP, Exercise Physiologist;Avian Konigsberg Oletta Darter, BA, ACSM CEP, Exercise Physiologist   Supervising physician immediately available to respond to emergencies [facility] immediately available ER MD   Physician(s) Jimmye Norman and Mariea Clonts   Medication changes reported     No   Fall or balance concerns reported    No   Warm-up and Cool-down Performed as group-led instruction   Resistance Training Performed Yes   VAD Patient? No     Pain Assessment   Currently in Pain? No/denies   Multiple Pain Sites No         Goals Met:  Proper associated with RPD/PD & O2 Sat Independence with exercise equipment Exercise tolerated well Strength training completed today  Goals Unmet:  Not Applicable  Comments: Pt able to follow exercise prescription today without complaint.  Will continue to monitor for progression.    Dr. Emily Filbert is Medical Director for [place] and [facility].

## 2016-11-22 ENCOUNTER — Encounter: Payer: Medicare Other | Admitting: *Deleted

## 2016-11-22 DIAGNOSIS — R05 Cough: Secondary | ICD-10-CM | POA: Diagnosis not present

## 2016-11-22 DIAGNOSIS — J449 Chronic obstructive pulmonary disease, unspecified: Secondary | ICD-10-CM

## 2016-11-22 NOTE — Progress Notes (Signed)
[place] psychosocial assessment reveals no barriers at this time to participation in Pulmonary Rehab.  he  has good family and friend support that encourages Brian to participate in [place] and progress with His goals.  Roel concerns are monitored, but he  has acknowledge that attending the program has helped to maintain quality life with improved mobility, self-care, and emotional and financial stability.  Reinaldo is commended for regular attendance and self-motivation to improve His pulmonary disease management.

## 2016-11-22 NOTE — Progress Notes (Signed)
Daily Session Note  Patient Details  Name: James SAKATA Sr. MRN: [id number] Date of Birth: 10/30/1929 Referring Provider:   Flowsheet Row Pulmonary Rehab from 10/24/2016 in [facility] Cardiac and Pulmonary Rehab  Referring Provider  Ramachandran      Encounter Date: 11/22/2016  Check In:     Session Check In - 11/22/16 1145      Check-In   Location [facility]-Cardiac & Pulmonary Rehab   Staff Present Alberteen Sam, MA, ACSM RCEP, Exercise Physiologist;Laureen Owens Shark, BS, RRT, Respiratory Therapist;Other   Supervising physician immediately available to respond to emergencies [facility] immediately available ER MD   Physician(s) Drs. McShane and Schaevitz   Medication changes reported     No   Fall or balance concerns reported    No   Warm-up and Cool-down Performed as group-led Location manager Performed Yes   VAD Patient? No     Pain Assessment   Currently in Pain? No/denies   Multiple Pain Sites No           Exercise Prescription Changes - 11/21/16 1500      Exercise Review   Progression Yes     Response to Exercise   Blood Pressure (Admit) 112/84   Blood Pressure (Exercise) 118/74   Blood Pressure (Exit) 118/68   Heart Rate (Admit) 72 bpm   Heart Rate (Exercise) 101 bpm   Heart Rate (Exit) 61 bpm   Oxygen Saturation (Admit) 97 %   Oxygen Saturation (Exercise) 94 %   Oxygen Saturation (Exit) 98 %   Rating of Perceived Exertion (Exercise) 13   Perceived Dyspnea (Exercise) 3   Symptoms none   Duration Progress to 45 minutes of aerobic exercise without signs/symptoms of physical distress   Intensity THRR unchanged     Progression   Progression Continue to progress workloads to maintain intensity without signs/symptoms of physical distress.   Average METs 2     Resistance Training   Training Prescription Yes   Weight 2 lbs   Reps 10-12     Interval Training   Interval Training No     Treadmill   MPH 1.2   Grade 0   Minutes 15   METs 1.92     NuStep   Level 2   Minutes 15   METs 1.9     REL-XR   Level 1   Watts --  speed 40-50   Minutes 15   METs 2.2      Goals Met:  Proper associated with RPD/PD & O2 Sat Independence with exercise equipment Using PLB without cueing & demonstrates good technique Exercise tolerated well No report of cardiac concerns or symptoms Strength training completed today  Goals Unmet:  Not Applicable  Comments: Pt able to follow exercise prescription today without complaint.  Will continue to monitor for progression.    Dr. Emily Filbert is Medical Director for [place] and [facility].

## 2016-11-24 ENCOUNTER — Encounter: Payer: Medicare Other | Admitting: *Deleted

## 2016-11-24 DIAGNOSIS — J449 Chronic obstructive pulmonary disease, unspecified: Secondary | ICD-10-CM

## 2016-11-24 NOTE — Progress Notes (Signed)
Daily Session Note  Patient Details  Name: James K Idleman Sr. MRN: [id number] Date of Birth: 05/28/1929 Referring Provider:   Flowsheet Row Pulmonary Rehab from 10/24/2016 in [facility] Cardiac and Pulmonary Rehab  Referring Provider  Ramachandran      Encounter Date: 11/24/2016  Check In:     Session Check In - 11/24/16 1155      Check-In   Location [facility]-Cardiac & Pulmonary Rehab   Staff Present Jessica Hawkins, MA, ACSM RCEP, Exercise Physiologist;Rebecca Sickles, DPT, CEEA;Other   , RN   Supervising physician immediately available to respond to emergencies [facility] immediately available ER MD   Physician(s) Drs. Quale and Paduchowski   Medication changes reported     No   Fall or balance concerns reported    No   Warm-up and Cool-down Performed as group-led instruction   Resistance Training Performed Yes   VAD Patient? No     Pain Assessment   Currently in Pain? No/denies   Multiple Pain Sites No           Exercise Prescription Changes - 11/24/16 1200      Exercise Review   Progression Yes     Response to Exercise   Oxygen Saturation (Exercise) 94 %   Symptoms none   Comments Home Exercise Guidelines given 11/24/16   Duration Progress to 45 minutes of aerobic exercise without signs/symptoms of physical distress   Intensity THRR unchanged     Progression   Progression Continue to progress workloads to maintain intensity without signs/symptoms of physical distress.   Average METs 2     Resistance Training   Training Prescription Yes   Weight 2 lbs   Reps 10-12     Interval Training   Interval Training No     Treadmill   MPH 1.2   Grade 0   Minutes 15   METs 1.92     NuStep   Level 2   Minutes 15   METs 1.9     REL-XR   Level 1   Watts --  speed 40-50   Minutes 15   METs 2.2     Home Exercise Plan   Plans to continue exercise at Home  walking   Frequency Add 2 additional days to program exercise sessions.      Goals Met:   Proper associated with RPD/PD & O2 Sat Independence with exercise equipment Using PLB without cueing & demonstrates good technique Exercise tolerated well No report of cardiac concerns or symptoms Strength training completed today  Goals Unmet:  Not Applicable  Comments: Pt able to follow exercise prescription today without complaint.  Will continue to monitor for progression.    Dr. Mark Miller is Medical Director for [facility] and [facility]. 

## 2016-11-24 NOTE — Progress Notes (Signed)
Daily Session Note  Patient Details  Name: James TILLIS Sr. MRN: [id number] Date of Birth: Mar 06, 1929 Referring Provider:   Flowsheet Row Pulmonary Rehab from 10/24/2016 in [facility] Cardiac and Pulmonary Rehab  Referring Provider  Ramachandran      Encounter Date: 11/24/2016  Check In:     Session Check In - 11/24/16 1155      Check-In   Location [facility]-Cardiac & Pulmonary Rehab   Staff Present Alberteen Sam, MA, ACSM RCEP, Exercise Physiologist;Rebecca Brayton El, DPT, CEEA;Other  Levell July, RN   Supervising physician immediately available to respond to emergencies [facility] immediately available ER MD   Physician(s) Drs. Quale and Paduchowski   Medication changes reported     No   Fall or balance concerns reported    No   Warm-up and Cool-down Performed as group-led Location manager Performed Yes   VAD Patient? No     Pain Assessment   Currently in Pain? No/denies   Multiple Pain Sites No         Goals Met:  Proper associated with RPD/PD & O2 Sat Independence with exercise equipment Using PLB without cueing & demonstrates good technique Exercise tolerated well Strength training completed today  Goals Unmet:  Not Applicable  Comments: Pt able to follow exercise prescription today without complaint.  Will continue to monitor for progression. Reviewed home exercise with pt today.  Pt plans to walk at home and at stores for exercise.  Reviewed THR, pulse, RPE, sign and symptoms, and when to call 911 or MD.  Also discussed weather considerations and indoor options.  Pt voiced understanding.    Dr. Emily Filbert is Medical Director for [place] and [facility].

## 2016-11-27 DIAGNOSIS — R05 Cough: Secondary | ICD-10-CM | POA: Diagnosis not present

## 2016-11-27 DIAGNOSIS — J449 Chronic obstructive pulmonary disease, unspecified: Secondary | ICD-10-CM

## 2016-11-27 NOTE — Progress Notes (Signed)
Daily Session Note  Patient Details  Name: James [name]. MRN: [id number] Date of Birth: August 27, 1929 Referring Provider:   Flowsheet Row Pulmonary Rehab from 10/24/2016 in [facility] Cardiac and Pulmonary Rehab  Referring Provider  Ramachandran      Encounter Date: 11/27/2016  Check In:     Session Check In - 11/27/16 1305      Check-In   Location [facility]-Cardiac & Pulmonary Rehab   Staff Present Carson Myrtle, BS, RRT, Respiratory Therapist;Kelly Amedeo Plenty, BS, ACSM CEP, Exercise Physiologist;Casin Federici Oletta Darter, BA, ACSM CEP, Exercise Physiologist   Supervising physician immediately available to respond to emergencies [facility] immediately available ER MD   Physician(s) Clearnce Hasten and Joni Fears   Medication changes reported     No   Fall or balance concerns reported    No   Warm-up and Cool-down Performed as group-led instruction   Resistance Training Performed Yes   VAD Patient? No     Pain Assessment   Currently in Pain? No/denies   Multiple Pain Sites No         Goals Met:  Proper associated with RPD/PD & O2 Sat Independence with exercise equipment Exercise tolerated well Strength training completed today  Goals Unmet:  Not Applicable  Comments: Pt able to follow exercise prescription today without complaint.  Will continue to monitor for progression.    Dr. Emily Filbert is Medical Director for [place] and [facility].

## 2016-11-29 ENCOUNTER — Encounter: Payer: Medicare Other | Admitting: *Deleted

## 2016-11-29 DIAGNOSIS — J449 Chronic obstructive pulmonary disease, unspecified: Secondary | ICD-10-CM

## 2016-11-29 DIAGNOSIS — R05 Cough: Secondary | ICD-10-CM | POA: Diagnosis not present

## 2016-11-29 NOTE — Progress Notes (Signed)
Daily Session Note  Patient Details  Name: James NAKAMA Sr. MRN: [id number] Date of Birth: [birth year] Referring Provider:   Flowsheet Row Pulmonary Rehab from 10/24/2016 in [facility] Cardiac and Pulmonary Rehab  Referring Provider  Ramachandran      Encounter Date: 11/29/2016  Check In:     Session Check In - 11/29/16 1233      Check-In   Location [facility]-Cardiac & Pulmonary Rehab   Staff Present Alberteen Sam, MA, ACSM RCEP, Exercise Physiologist;Laureen Owens Shark, BS, RRT, Respiratory Therapist;Other   Supervising physician immediately available to respond to emergencies [facility] immediately available ER MD   Physician(s) Drs. Norm Salt, and [place]   Medication changes reported     No   Fall or balance concerns reported    No   Warm-up and Cool-down Performed as group-led instruction   Resistance Training Performed Yes   VAD Patient? No     Pain Assessment   Currently in Pain? No/denies   Multiple Pain Sites No         Goals Met:  Proper associated with RPD/PD & O2 Sat Independence with exercise equipment Using PLB without cueing & demonstrates good technique Exercise tolerated well Strength training completed today  Goals Unmet:  Not Applicable  Comments: Pt able to follow exercise prescription today without complaint.  Will continue to monitor for progression.    Dr. Emily Filbert is Medical Director for [place] and [facility].

## 2016-12-01 ENCOUNTER — Encounter: Payer: Medicare Other | Admitting: *Deleted

## 2016-12-01 DIAGNOSIS — R05 Cough: Secondary | ICD-10-CM | POA: Diagnosis not present

## 2016-12-01 DIAGNOSIS — J449 Chronic obstructive pulmonary disease, unspecified: Secondary | ICD-10-CM

## 2016-12-01 NOTE — Progress Notes (Signed)
Daily Session Note  Patient Details  Name: James BILTON Sr. MRN: [id number] Date of Birth: Jun 09, 1929 Referring Provider:   Flowsheet Row Pulmonary Rehab from 10/24/2016 in [facility] Cardiac and Pulmonary Rehab  Referring Provider  Ramachandran      Encounter Date: 12/01/2016  Check In:     Session Check In - 12/01/16 1143      Check-In   Location [facility]-Cardiac & Pulmonary Rehab   Staff Present Alberteen Sam, MA, ACSM RCEP, Exercise Physiologist;Susanne Bice, RN, BSN, CCRP;Other  Simmie Davies, RN, BSN   Supervising physician immediately available to respond to emergencies [facility] immediately available ER MD   Physician(s) Drs. Earnstine Regal, and Paduchowski   Medication changes reported     No   Fall or balance concerns reported    No   Warm-up and Cool-down Performed as group-led instruction   Resistance Training Performed Yes   VAD Patient? No     Pain Assessment   Currently in Pain? No/denies   Multiple Pain Sites No         Goals Met:  Proper associated with RPD/PD & O2 Sat Independence with exercise equipment Using PLB without cueing & demonstrates good technique Exercise tolerated well Strength training completed today  Goals Unmet:  Not Applicable  Comments: Pt able to follow exercise prescription today without complaint.  Will continue to monitor for progression.    Dr. Emily Filbert is Medical Director for [place] and [facility].

## 2016-12-04 ENCOUNTER — Encounter: Payer: Self-pay | Admitting: Respiratory Therapy

## 2016-12-04 DIAGNOSIS — R05 Cough: Secondary | ICD-10-CM | POA: Diagnosis not present

## 2016-12-04 DIAGNOSIS — J449 Chronic obstructive pulmonary disease, unspecified: Secondary | ICD-10-CM

## 2016-12-04 NOTE — Progress Notes (Signed)
Daily Session Note  Patient Details  Name: James [name]. MRN: [id number] Date of Birth: September 16, 1929 Referring Provider:   Flowsheet Row Pulmonary Rehab from 10/24/2016 in [facility] Cardiac and Pulmonary Rehab  Referring Provider  Ramachandran      Encounter Date: 12/04/2016  Check In:     Session Check In - 12/04/16 1249      Check-In   Location [facility]-Cardiac & Pulmonary Rehab   Staff Present Nyoka Cowden, RN, BSN, [place], BS, RRT, Respiratory Bertis Ruddy, BS, ACSM CEP, Exercise Physiologist;Saraiya Kozma Oletta Darter, [place], ACSM CEP, Exercise Physiologist   Supervising physician immediately available to respond to emergencies [facility] immediately available ER MD   Physician(s) Dominic Pea and Paduchowski   Medication changes reported     No   Fall or balance concerns reported    No   Warm-up and Cool-down Performed as group-led instruction   Resistance Training Performed Yes   VAD Patient? No     Pain Assessment   Currently in Pain? No/denies         Goals Met:  Proper associated with RPD/PD & O2 Sat Independence with exercise equipment Exercise tolerated well Strength training completed today  Goals Unmet:  Not Applicable  Comments: Pt able to follow exercise prescription today without complaint.  Will continue to monitor for progression.    Dr. Emily Filbert is Medical Director for [place] and [facility].

## 2016-12-04 NOTE — Progress Notes (Signed)
Pulmonary Individual Treatment Plan  Patient Details  Name: James JOHANNES Sr. MRN: [id number] Date of Birth: 26-Mar-1929 Referring Provider:   Flowsheet Row Pulmonary Rehab from 10/24/2016 in [facility] Cardiac and Pulmonary Rehab  Referring Provider  Ramachandran      Initial Encounter Date:  Flowsheet Row Pulmonary Rehab from 10/24/2016 in [facility] Cardiac and Pulmonary Rehab  Date  10/24/16  Referring Provider  Ashby Dawes      Visit Diagnosis: COPD, mild ([place])  Patient's Home Medications on Admission:  Current Outpatient Prescriptions:    albuterol (PROAIR HFA) 108 (90 Base) MCG/ACT inhaler, Inhale 2 puffs into the lungs every 6 (six) hours as needed for wheezing or shortness of breath., Disp: 1 Inhaler, Rfl: 2   aspirin EC 325 MG tablet, Take by mouth., Disp: , Rfl:    atorvastatin (LIPITOR) 80 MG tablet, Take 1 tablet (80 mg total) by mouth daily., Disp: 90 tablet, Rfl: 4   azelastine (OPTIVAR) 0.05 % ophthalmic solution, Apply 1 drop to eye 2 (two) times daily., Disp: 6 mL, Rfl: 5   benazepril-hydrochlorthiazide (LOTENSIN HCT) 20-12.5 MG tablet, Take 1 tablet by mouth daily., Disp: 90 tablet, Rfl: 1   beta carotene w/minerals (OCUVITE) tablet, Take 1 tablet by mouth daily., Disp: , Rfl:    budesonide-formoterol (SYMBICORT) 160-4.5 MCG/ACT inhaler, Inhale 2 puffs into the lungs 2 (two) times daily., Disp: 1 Inhaler, Rfl: 12   fluticasone (FLONASE) 50 MCG/ACT nasal spray, Place 2 sprays into both nostrils daily., Disp: 48 g, Rfl: 5   furosemide (LASIX) 20 MG tablet, Take 1 tablet (20 mg total) by mouth daily., Disp: 30 tablet, Rfl: 0   HYDROcodone-acetaminophen (NORCO/VICODIN) 5-325 MG tablet, Take 1 tablet by mouth 2 (two) times daily as needed., Disp: 30 tablet, Rfl: 0   lansoprazole (PREVACID) 30 MG capsule, TAKE 1 CAPSULE EVERY DAY, Disp: 30 capsule, Rfl: 6   LORazepam (ATIVAN) 1 MG tablet, Take 1 tablet (1 mg total) by mouth at bedtime as needed for anxiety., Disp: 30  tablet, Rfl: 5   Magnesium 400 MG CAPS, Take 1 capsule by mouth daily. , Disp: , Rfl:    metoprolol (LOPRESSOR) 50 MG tablet, Take 1 tablet (50 mg total) by mouth 2 (two) times daily., Disp: 180 tablet, Rfl: 4   montelukast (SINGULAIR) 10 MG tablet, TAKE 1 TABLET BY MOUTH DAILY, Disp: 30 tablet, Rfl: 12   MULTIPLE VITAMIN PO, Take 1 tablet by mouth daily. , Disp: , Rfl:    naproxen (NAPROSYN) 500 MG tablet, Take 500 mg by mouth 2 (two) times daily as needed. , Disp: , Rfl:    OMEGA-3 FATTY ACIDS PO, Take 1,000 mg by mouth daily. , Disp: , Rfl:    SPIRIVA HANDIHALER 18 MCG inhalation capsule, INHALE 1 CAPSULE AS DIRECTED ONCE A DAY, Disp: 30 capsule, Rfl: 5   tamsulosin (FLOMAX) 0.4 MG CAPS capsule, Take 1 capsule (0.4 mg total) by mouth daily., Disp: 90 capsule, Rfl: 4   traMADol (ULTRAM) 50 MG tablet, Take 50 mg by mouth 2 (two) times daily. , Disp: , Rfl:   Past Medical History: Past Medical History:  Diagnosis Date   Back pain    CAD (coronary artery disease)    Diverticulosis    GERD (gastroesophageal reflux disease)    History of chicken pox    History of measles    History of mumps    Hyperglycemia    Hyperlipidemia    Hypertension    Hypotension    Prostate cancer ([place])  TIA (transient ischemic attack)     Tobacco Use: History  Smoking Status   Former Smoker   Packs/day: 1.00   Years: 11.00   Types: Cigarettes   Quit date: 12/18/1978  Smokeless Tobacco   Never Used    Labs: Recent Review Flowsheet Data    Labs for ITP Cardiac and Pulmonary Rehab Latest Ref Rng & Units 10/05/2014 10/07/2014   Cholestrol 0 - 200 mg/dL - 104   LDLCALC mg/dL - 40   HDL 35 - 70 mg/dL - 52   Trlycerides 40 - 160 mg/dL - 61   Hemoglobin A1c 4.0 - 6.0 % 5.7 5.7       ADL UCSD:     Pulmonary Assessment Scores    Row Name 10/24/16 1213         ADL UCSD   ADL Phase Entry     SOB Score total 29     Rest 1     Walk 2     Stairs 3     Bath 3      Dress 2     Shop 1        Pulmonary Function Assessment:     Pulmonary Function Assessment - 10/24/16 1212      Initial Spirometry Results   FVC% 37 %   FEV1% 47 %   FEV1/FVC Ratio 87.3     Breath   Bilateral Breath Sounds Clear   Shortness of Breath Yes;Limiting activity      Exercise Target Goals:    Exercise Program Goal: Individual exercise prescription set with THRR, safety & activity barriers. Participant demonstrates ability to understand and report RPE using BORG scale, to self-measure pulse accurately, and to acknowledge the importance of the exercise prescription.  Exercise Prescription Goal: Starting with aerobic activity 30 plus minutes a day, 3 days per week for initial exercise prescription. Provide home exercise prescription and guidelines that participant acknowledges understanding prior to discharge.  Activity Barriers & Risk Stratification:     Activity Barriers & Cardiac Risk Stratification - 10/24/16 1211      Activity Barriers & Cardiac Risk Stratification   Activity Barriers Back Problems;Arthritis;Shortness of Breath;Deconditioning      6 Minute Walk:     6 Minute Walk    Row Name 10/24/16 1258         6 Minute Walk   Distance 800 feet     Walk Time 6 minutes     # of Rest Breaks 0     MPH 1.5     METS 2.15     RPE 17     Perceived Dyspnea  4     VO2 Peak 1.9     Symptoms Yes (comment)     Comments left hip pain more than breathing     Resting HR 67 bpm     Resting BP 126/62     Max Ex. HR 110 bpm     Max Ex. BP 150/70       Interval HR   Baseline HR 67     1 Minute HR 92     2 Minute HR 99     3 Minute HR 96     4 Minute HR 110     5 Minute HR 99     6 Minute HR 100     2 Minute Post HR 81     Interval Heart Rate? Yes       Interval Oxygen   Interval  Oxygen? Yes     Baseline Oxygen Saturation % 96 %     1 Minute Oxygen Saturation % 92 %     2 Minute Oxygen Saturation % 92 %     3 Minute Oxygen Saturation % 91 %      4 Minute Oxygen Saturation % 93 %     5 Minute Oxygen Saturation % 91 %     6 Minute Oxygen Saturation % 93 %     2 Minute Post Oxygen Saturation % 96 %        Initial Exercise Prescription:     Initial Exercise Prescription - 10/24/16 1300      Date of Initial Exercise RX and Referring Provider   Date 10/24/16   Referring Provider Ashby Dawes     Treadmill   MPH 1.4   Grade 0   Minutes 15  5/5/5   METs 2.07     NuStep   Level 2   Minutes 15   METs 2     T5 Nustep   Level 1   Minutes 15   METs 2     Intensity   THRR 40-80% of Max Heartrate 94-121   Ratings of Perceived Exertion 11-13   Perceived Dyspnea 0-4     Progression   Progression Continue to progress workloads to maintain intensity without signs/symptoms of physical distress.     Resistance Training   Training Prescription Yes   Weight 2   Reps 10-12      Perform Capillary Blood Glucose checks as needed.  Exercise Prescription Changes:     Exercise Prescription Changes    Row Name 10/30/16 1200 11/07/16 1600 11/21/16 1500 11/24/16 1200       Exercise Review   Progression  -- Yes Yes Yes      Response to Exercise   Blood Pressure (Admit) 140/60 136/70 112/84  --    Blood Pressure (Exercise) 132/68 110/72 118/74  --    Blood Pressure (Exit)  -- 122/80 118/68  --    Heart Rate (Admit) 67 bpm 70 bpm 72 bpm  --    Heart Rate (Exercise) 75 bpm 92 bpm 101 bpm  --    Heart Rate (Exit)  -- 82 bpm 61 bpm  --    Oxygen Saturation (Admit) 97 % 92 % 97 %  --    Oxygen Saturation (Exercise) 95 %  Room air 92 % 94 % 94 %    Oxygen Saturation (Exit)  -- 95 % 98 %  --    Rating of Perceived Exertion (Exercise) '11 13 13  ' --    Perceived Dyspnea (Exercise) '3 4 3  ' --    Symptoms --  new knee pain which felt better with exercise.  -- none none    Comments  --  --  -- Home Exercise Guidelines given 11/24/16    Duration  -- Progress to 45 minutes of aerobic exercise without signs/symptoms of physical  distress Progress to 45 minutes of aerobic exercise without signs/symptoms of physical distress Progress to 45 minutes of aerobic exercise without signs/symptoms of physical distress    Intensity  -- THRR unchanged THRR unchanged THRR unchanged      Progression   Progression  -- Continue to progress workloads to maintain intensity without signs/symptoms of physical distress. Continue to progress workloads to maintain intensity without signs/symptoms of physical distress. Continue to progress workloads to maintain intensity without signs/symptoms of physical distress.    Average METs  --  1.99 2 2      Resistance Training   Training Prescription Yes Yes Yes Yes    Weight 2 2 lbs 2 lbs 2 lbs    Reps 10-12 10-12 10-12 10-12      Interval Training   Interval Training  -- No No No      Treadmill   MPH 1.4 1 1.2 1.2    Grade 0 0 0 0    Minutes 15  Did alternate equipment due to knee pain will cont to assess '15 15 15    ' METs 2.07 1.77 1.92 1.92      NuStep   Level '2 2 2 2    ' Minutes '15 15 15 15    ' METs 2 2.2 1.9 1.9      REL-XR   Level  --  -- 1 1    Watts  --  -- --  speed 40-50 --  speed 40-50    Minutes  --  -- 15 15    METs  --  -- 2.2 2.2      T5 Nustep   Level 1  --  --  --    Minutes 15  --  --  --    METs 2  --  --  --      Home Exercise Plan   Plans to continue exercise at  --  --  -- Home  walking    Frequency  --  --  -- Add 2 additional days to program exercise sessions.       Exercise Comments:     Exercise Comments    Row Name 11/07/16 1620 11/21/16 1545 11/24/16 1209 11/24/16 1230     Exercise Comments Seung had a great first day on 11/13.  He is doing well.  We will continue to monitor his progression. Pascal continues to do well with exercise.  We will try to bump up some of his workloads.  He had bumped up the NuStep by accident, but did not return to the increase.  We will continue to monitor his progression. Reviewed METs average and discussed progression  with pt today. Reviewed home exercise with pt today.  Pt plans to walk at home and at stores for exercise.  Reviewed THR, pulse, RPE, sign and symptoms, and when to call 911 or MD.  Also discussed weather considerations and indoor options.  Pt voiced understanding.       Discharge Exercise Prescription (Final Exercise Prescription Changes):     Exercise Prescription Changes - 11/24/16 1200      Exercise Review   Progression Yes     Response to Exercise   Oxygen Saturation (Exercise) 94 %   Symptoms none   Comments Home Exercise Guidelines given 11/24/16   Duration Progress to 45 minutes of aerobic exercise without signs/symptoms of physical distress   Intensity THRR unchanged     Progression   Progression Continue to progress workloads to maintain intensity without signs/symptoms of physical distress.   Average METs 2     Resistance Training   Training Prescription Yes   Weight 2 lbs   Reps 10-12     Interval Training   Interval Training No     Treadmill   MPH 1.2   Grade 0   Minutes 15   METs 1.92     NuStep   Level 2   Minutes 15   METs 1.9     REL-XR   Level 1   Watts --  speed 40-50   Minutes 15   METs 2.2     Home Exercise Plan   Plans to continue exercise at Home  walking   Frequency Add 2 additional days to program exercise sessions.       Nutrition:  Target Goals: Understanding of nutrition guidelines, daily intake of sodium <153m, cholesterol <2084m calories 30% from fat and 7% or less from saturated fats, daily to have 5 or more servings of fruits and vegetables.  Biometrics:     Pre Biometrics - 10/24/16 1257      Pre Biometrics   Height '5\' 5"'  (1.651 m)   Weight 207 lb 11.2 oz (94.2 kg)   Waist Circumference 44.25 inches   Hip Circumference 44.25 inches   Waist to Hip Ratio 1 %   BMI (Calculated) 34.6       Nutrition Therapy Plan and Nutrition Goals:     Nutrition Therapy & Goals - 10/24/16 1207      Intervention Plan    Intervention Prescribe, educate and counsel regarding individualized specific dietary modifications aiming towards targeted core components such as weight, hypertension, lipid management, diabetes, heart failure and other comorbidities.   Expected Outcomes Short Term Goal: Understand basic principles of dietary content, such as calories, fat, sodium, cholesterol and nutrients.;Short Term Goal: A plan has been developed with personal nutrition goals set during dietitian appointment.;Long Term Goal: Adherence to prescribed nutrition plan.      Nutrition Discharge: Rate Your Plate Scores:     Nutrition Assessments - 10/30/16 1554      Rate Your Plate Scores   Pre Score 60   Pre Score % 66.7 %      Psychosocial: Target Goals: Acknowledge presence or absence of depression, maximize coping skills, provide positive support system. Participant is able to verbalize types and ability to use techniques and skills needed for reducing stress and depression.  Initial Review & Psychosocial Screening:     Initial Psych Review & Screening - 10/24/16 1209      Initial Review   Current issues with --  No concerns noted     Family Dynamics   Good Support System? Yes  Wife and neighbors     Barriers   Psychosocial barriers to participate in program There are no identifiable barriers or psychosocial needs.;The patient should benefit from training in stress management and relaxation.     Screening Interventions   Interventions Encouraged to exercise      Quality of Life Scores:     Quality of Life - 10/24/16 1218      Quality of Life Scores   Health/Function Pre 19.81 %   Socioeconomic Pre 21 %   Psych/Spiritual Pre 21 %   Family Pre 21 %   GLOBAL Pre 20.46 %      PHQ-9: Recent Review Flowsheet Data    [facility] 10/24/2016 12/03/2015   Decreased Interest 0 0   Down, Depressed, Hopeless 0 0   PHQ - 2 Score 0 0   Altered sleeping 1 -   Tired, decreased energy 2 -    Change in appetite 0 -   Feeling bad or failure about yourself  2  -   Trouble concentrating 1 -   Moving slowly or fidgety/restless 0 -   Suicidal thoughts 0 -   PHQ-9 Score 6 -   Difficult doing work/chores Somewhat difficult -      Psychosocial Evaluation and Intervention:     Psychosocial Evaluation - 11/06/16  1226      Psychosocial Evaluation & Interventions   Interventions Encouraged to exercise with the program and follow exercise prescription   Comments Counselor met with James [name] today for initial psychosocial evaluation.  He is a well-adjusted 82 year old who has been diagnosed with mild COPD.  He has a wife of 6 years and is actively involved in his local church.  He has had CABGx5 in 2011 and hip replacement in 1997.  He also is in remission from Prostate Cancer in 2014.  James [name] states he has "memory issues" as well., although he may have had some difficulty recalling information; it seemed more related to his lack of ability to hear - as he forgot his hearing aid today.  James [name] states his sleep varies and his appetite is "too good."  He denies a history or current symptoms of depression or anxiety and states he is a positive person most of the time.  James [name] has minimal stress in his life other than his health.  He reports his spouse walks (3) miles per day and he may join her once he completes this program.  His goals while here are to breathe better; increase his energy and possible lose some weight.        Psychosocial Re-Evaluation:     Psychosocial Re-Evaluation    [place] Name 11/22/16 1521             Psychosocial Re-Evaluation   Comments James [name] psychosocial assessment reveals no barriers at this time to participation in Pulmonary Rehab.  he  has good family and friend support that encourages James [name] to participate in [place] and progress with His goals.  Gerald concerns are monitored, but he  has acknowledge that attending the program has helped to maintain quality life with  improved mobility, self-care, and emotional and financial stability.  Danuel is commended for regular attendance and self-motivation to improve His pulmonary disease management.         Education: Education Goals: Education classes will be provided on a weekly basis, covering required topics. Participant will state understanding/return demonstration of topics presented.  Learning Barriers/Preferences:     Learning Barriers/Preferences - 10/24/16 1211      Learning Barriers/Preferences   Learning Barriers Hearing   Learning Preferences None      Education Topics: Initial Evaluation Education: - Verbal, written and demonstration of respiratory meds, RPE/PD scales, oximetry and breathing techniques. Instruction on use of nebulizers and MDIs: cleaning and proper use, rinsing mouth with steroid doses and importance of monitoring MDI activations.   General Nutrition Guidelines/Fats and Fiber: -Group instruction provided by verbal, written material, models and posters to present the general guidelines for heart healthy nutrition. Gives an explanation and review of dietary fats and fiber. Flowsheet Row Pulmonary Rehab from 12/01/2016 in [facility] Cardiac and Pulmonary Rehab  Date  11/06/16  Educator  CR  Instruction Review Code  2- meets goals/outcomes      Controlling Sodium/Reading Food Labels: -Group verbal and written material supporting the discussion of sodium use in heart healthy nutrition. Review and explanation with models, verbal and written materials for utilization of the food label. Flowsheet Row Pulmonary Rehab from 12/01/2016 in [facility] Cardiac and Pulmonary Rehab  Date  11/13/16  Educator  CR  Instruction Review Code  2- meets goals/outcomes      Exercise Physiology & Risk Factors: - Group verbal and written instruction with models to review the exercise physiology of the cardiovascular system and associated critical  values. Details cardiovascular disease risk factors and the  goals associated with each risk factor.   Aerobic Exercise & Resistance Training: - Gives group verbal and written discussion on the health impact of inactivity. On the components of aerobic and resistive training programs and the benefits of this training and how to safely progress through these programs.   Flexibility, Balance, General Exercise Guidelines: - Provides group verbal and written instruction on the benefits of flexibility and balance training programs. Provides general exercise guidelines with specific guidelines to those with heart or lung disease. Demonstration and skill practice provided.   Stress Management: - Provides group verbal and written instruction about the health risks of elevated stress, cause of high stress, and healthy ways to reduce stress.   Depression: - Provides group verbal and written instruction on the correlation between heart/lung disease and depressed mood, treatment options, and the stigmas associated with seeking treatment. Flowsheet Row Pulmonary Rehab from 12/01/2016 in [facility] Cardiac and Pulmonary Rehab  Date  11/20/16  Educator  [facility]  Instruction Review Code  2- meets goals/outcomes      Exercise & Equipment Safety: - Individual verbal instruction and demonstration of equipment use and safety with use of the equipment. Flowsheet Row Pulmonary Rehab from 12/01/2016 in [facility] Cardiac and Pulmonary Rehab  Date  10/30/16  Educator  C. EnterkinRN  Instruction Review Code  2- meets goals/outcomes      Infection Prevention: - Provides verbal and written material to individual with discussion of infection control including proper hand washing and proper equipment cleaning during exercise session. Flowsheet Row Pulmonary Rehab from 12/01/2016 in [facility] Cardiac and Pulmonary Rehab  Date  10/30/16  Educator  C. EnterkinRN  Instruction Review Code  2- meets goals/outcomes      Falls Prevention: - Provides verbal and written material to individual  with discussion of falls prevention and safety. Flowsheet Row Pulmonary Rehab from 12/01/2016 in [facility] Cardiac and Pulmonary Rehab  Date  10/30/16  Educator  C. Enterkin R.N.  Instruction Review Code  2- meets goals/outcomes      Diabetes: - Individual verbal and written instruction to review signs/symptoms of diabetes, desired ranges of glucose level fasting, after meals and with exercise. Advice that pre and post exercise glucose checks will be done for 3 sessions at entry of program.   Chronic Lung Diseases: - Group verbal and written instruction to review new updates, new respiratory medications, new advancements in procedures and treatments. Provide informative websites and "800" numbers of self-education. Flowsheet Row Pulmonary Rehab from 12/01/2016 in [facility] Cardiac and Pulmonary Rehab  Date  11/15/16  Educator  LB  Instruction Review Code  2- meets goals/outcomes      Lung Procedures: - Group verbal and written instruction to describe testing methods done to diagnose lung disease. Review the outcome of test results. Describe the treatment choices: Pulmonary Function Tests, ABGs and oximetry.   Energy Conservation: - Provide group verbal and written instruction for methods to conserve energy, plan and organize activities. Instruct on pacing techniques, use of adaptive equipment and posture/positioning to relieve shortness of breath.   Triggers: - Group verbal and written instruction to review types of environmental controls: home humidity, furnaces, filters, dust mite/pet prevention, HEPA vacuums. To discuss weather changes, air quality and the benefits of nasal washing.   Exacerbations: - Group verbal and written instruction to provide: warning signs, infection symptoms, calling MD promptly, preventive modes, and value of vaccinations. Review: effective airway clearance, coughing and/or vibration techniques. Create an  Action Plan. Flowsheet Row Pulmonary Rehab from 12/01/2016  in [facility] Cardiac and Pulmonary Rehab  Date  11/22/16  Educator  LB  Instruction Review Code  2- meets goals/outcomes      Oxygen: - Individual and group verbal and written instruction on oxygen therapy. Includes supplement oxygen, available portable oxygen systems, continuous and intermittent flow rates, oxygen safety, concentrators, and Medicare reimbursement for oxygen.   Respiratory Medications: - Group verbal and written instruction to review medications for lung disease. Drug class, frequency, complications, importance of spacers, rinsing mouth after steroid MDI's, and proper cleaning methods for nebulizers.   AED/CPR: - Group verbal and written instruction with the use of models to demonstrate the basic use of the AED with the basic ABC's of resuscitation.   Breathing Retraining: - Provides individuals verbal and written instruction on purpose, frequency, and proper technique of diaphragmatic breathing and pursed-lipped breathing. Applies individual practice skills. Flowsheet Row Pulmonary Rehab from 12/01/2016 in [facility] Cardiac and Pulmonary Rehab  Date  10/30/16  Educator  Loletha Grayer ENterkinRN  Instruction Review Code  2- meets goals/outcomes      Anatomy and Physiology of the Lungs: - Group verbal and written instruction with the use of models to provide basic lung anatomy and physiology related to function, structure and complications of lung disease.   Heart Failure: - Group verbal and written instruction on the basics of heart failure: signs/symptoms, treatments, explanation of ejection fraction, enlarged heart and cardiomyopathy. Flowsheet Row Pulmonary Rehab from 12/01/2016 in [facility] Cardiac and Pulmonary Rehab  Date  12/01/16  Educator  SB  Instruction Review Code  2- meets goals/outcomes      Sleep Apnea: - Individual verbal and written instruction to review Obstructive Sleep Apnea. Review of risk factors, methods for diagnosing and types of masks and machines for  OSA.   Anxiety: - Provides group, verbal and written instruction on the correlation between heart/lung disease and anxiety, treatment options, and management of anxiety.   Relaxation: - Provides group, verbal and written instruction about the benefits of relaxation for patients with heart/lung disease. Also provides patients with examples of relaxation techniques.   Knowledge Questionnaire Score:     Knowledge Questionnaire Score - 10/24/16 1211      Knowledge Questionnaire Score   Pre Score 10/10       Core Components/Risk Factors/Patient Goals at Admission:     Personal Goals and Risk Factors at Admission - 10/24/16 1207      Core Components/Risk Factors/Patient Goals on Admission    Weight Management Yes;Weight Maintenance   Admit Weight 207 lb 11.2 oz (94.2 kg)   Goal Weight: Short Term 204 lb (92.5 kg)   Goal Weight: Long Term 180 lb (81.6 kg)   Expected Outcomes Short Term: Continue to assess and modify interventions until short term weight is achieved;Long Term: Adherence to nutrition and physical activity/exercise program aimed toward attainment of established weight goal;Weight Loss: Understanding of general recommendations for a balanced deficit meal plan, which promotes 1-2 lb weight loss per week and includes a negative energy balance of [phone] kcal/d   Sedentary Yes   Intervention Provide advice, education, support and counseling about physical activity/exercise needs.;Develop an individualized exercise prescription for aerobic and resistive training based on initial evaluation findings, risk stratification, comorbidities and participant's personal goals.   Expected Outcomes Achievement of increased cardiorespiratory fitness and enhanced flexibility, muscular endurance and strength shown through measurements of functional capacity and personal statement of participant.   Increase Strength and Stamina Yes  Intervention Provide advice, education, support and  counseling about physical activity/exercise needs.;Develop an individualized exercise prescription for aerobic and resistive training based on initial evaluation findings, risk stratification, comorbidities and participant's personal goals.   Expected Outcomes Achievement of increased cardiorespiratory fitness and enhanced flexibility, muscular endurance and strength shown through measurements of functional capacity and personal statement of participant.   Improve shortness of breath with ADL's Yes   Intervention Provide education, individualized exercise plan and daily activity instruction to help decrease symptoms of SOB with activities of daily living.   Expected Outcomes Short Term: Achieves a reduction of symptoms when performing activities of daily living.   Develop more efficient breathing techniques such as purse lipped breathing and diaphragmatic breathing; and practicing self-pacing with activity Yes   Intervention Provide education, demonstration and support about specific breathing techniuqes utilized for more efficient breathing. Include techniques such as pursed lipped breathing, diaphragmatic breathing and self-pacing activity.   Expected Outcomes Short Term: Participant will be able to demonstrate and use breathing techniques as needed throughout daily activities.   Increase knowledge of respiratory medications and ability to use respiratory devices properly  Yes   Intervention Provide education and demonstration as needed of appropriate use of medications, inhalers, and oxygen therapy.   Expected Outcomes Short Term: Achieves understanding of medications use. Understands that oxygen is a medication prescribed by physician. Demonstrates appropriate use of inhaler and oxygen therapy.   Hypertension Yes   Intervention Provide education on lifestyle modifcations including regular physical activity/exercise, weight management, moderate sodium restriction and increased consumption of fresh  fruit, vegetables, and low fat dairy, alcohol moderation, and smoking cessation.;Monitor prescription use compliance.   Expected Outcomes Short Term: Continued assessment and intervention until BP is < 140/53m HG in hypertensive participants. < 130/883mHG in hypertensive participants with diabetes, heart failure or chronic kidney disease.;Long Term: Maintenance of blood pressure at goal levels.   Lipids Yes   Intervention Provide education and support for participant on nutrition & aerobic/resistive exercise along with prescribed medications to achieve LDL <7090mHDL >66m30m Expected Outcomes Short Term: Participant states understanding of desired cholesterol values and is compliant with medications prescribed. Participant is following exercise prescription and nutrition guidelines.;Long Term: Cholesterol controlled with medications as prescribed, with individualized exercise RX and with personalized nutrition plan. Value goals: LDL < 70mg58mL > 40 mg.      Core Components/Risk Factors/Patient Goals Review:      Goals and Risk Factor Review    Row Name 10/30/16 1254 11/22/16 1507           Core Components/Risk Factors/Patient Goals Review   Personal Goals Review Develop more efficient breathing techniques such as purse lipped breathing and diaphragmatic breathing and practicing self-pacing with activity. Weight Management/Obesity;Sedentary;Increase Strength and Stamina;Improve shortness of breath with ADL's;Develop more efficient breathing techniques such as purse lipped breathing and diaphragmatic breathing and practicing self-pacing with activity.;Increase knowledge of respiratory medications and ability to use respiratory devices properly.;Hypertension;Lipids      Review Cont to encourage pursed lip breathing James BrodeHakimincreased his exercise goals, notices improvement in his shortness of breath, and is using PLB daily with his activity in [facility] and at home. He has good understanding  of his Spiriva and Symbicort. A spacer was given to him today for use with his Symbicort and was given instruction on technique and cleaning of the spacer. His blood pressures are acceptable, and he is compliant with his medication, both blood pressure and cholestrol. Planning to  meet with the dietitian, James [name] has the food diary to complete.       Expected Outcomes Able to do activities of daily living without shortness of breath Continue increasing his exercise goals and increasing knowledge of COPD management.          Core Components/Risk Factors/Patient Goals at Discharge (Final Review):      Goals and Risk Factor Review - 11/22/16 1507      Core Components/Risk Factors/Patient Goals Review   Personal Goals Review Weight Management/Obesity;Sedentary;Increase Strength and Stamina;Improve shortness of breath with ADL's;Develop more efficient breathing techniques such as purse lipped breathing and diaphragmatic breathing and practicing self-pacing with activity.;Increase knowledge of respiratory medications and ability to use respiratory devices properly.;Hypertension;Lipids   Review James [name] has increased his exercise goals, notices improvement in his shortness of breath, and is using PLB daily with his activity in [facility] and at home. He has good understanding of his Spiriva and Symbicort. A spacer was given to him today for use with his Symbicort and was given instruction on technique and cleaning of the spacer. His blood pressures are acceptable, and he is compliant with his medication, both blood pressure and cholestrol. Planning to meet with the dietitian, James [name] has the food diary to complete.    Expected Outcomes Continue increasing his exercise goals and increasing knowledge of COPD management.       ITP Comments:     ITP Comments    Row Name 10/24/16 1155 10/30/16 1256 11/17/16 1341       ITP Comments Initial medical review completed with ITP created. Documentation of  diagnosis in [facility] Office Encounter 09/28/2016 First full day of exercise!  Patient was oriented to gym and equipment including functions, settings, policies, and procedures.  Patient's individual exercise prescription and treatment plan were reviewed.  All starting workloads were established based on the results of the 6 minute walk test done at initial orientation visit.  The plan for exercise progression was also introduced and progression will be customized based on patient's performance and goals. Attended Know Your Numbers education class.        Comments: 30 day note review

## 2016-12-06 ENCOUNTER — Encounter: Payer: Medicare Other | Admitting: *Deleted

## 2016-12-06 DIAGNOSIS — J449 Chronic obstructive pulmonary disease, unspecified: Secondary | ICD-10-CM

## 2016-12-06 DIAGNOSIS — R05 Cough: Secondary | ICD-10-CM | POA: Diagnosis not present

## 2016-12-06 NOTE — Progress Notes (Signed)
Daily Session Note  Patient Details  Name: James [name]. MRN: [id number] Date of Birth: 01/10/1929 Referring Provider:   Flowsheet Row Pulmonary Rehab from 10/24/2016 in [facility] Cardiac and Pulmonary Rehab  Referring Provider  Ramachandran      Encounter Date: 12/06/2016  Check In:     Session Check In - 12/06/16 1452      Check-In   Location [facility]-Cardiac & Pulmonary Rehab   Staff Present Alberteen Sam, MA, ACSM RCEP, Exercise Physiologist;Patricia Surles RN BSN;Laureen Owens Shark, BS, RRT, Respiratory Therapist   Supervising physician immediately available to respond to emergencies [facility] immediately available ER MD   Physician(s) Drs. Schaevitz and Alfred Levins   Medication changes reported     No   Fall or balance concerns reported    No   Warm-up and Cool-down Performed as group-led Location manager Performed Yes   VAD Patient? No     Pain Assessment   Currently in Pain? No/denies   Multiple Pain Sites No           Exercise Prescription Changes - 12/06/16 1400      Exercise Review   Progression Yes     Response to Exercise   Blood Pressure (Admit) 124/72   Blood Pressure (Exercise) 140/68   Blood Pressure (Exit) 98/58   Heart Rate (Admit) 68 bpm   Heart Rate (Exercise) 105 bpm   Heart Rate (Exit) 68 bpm   Oxygen Saturation (Admit) 97 %   Oxygen Saturation (Exercise) 95 %   Oxygen Saturation (Exit) 96 %   Rating of Perceived Exertion (Exercise) 13   Perceived Dyspnea (Exercise) 3   Symptoms none   Comments Home Exercise Guidelines given 11/24/16   Duration Progress to 45 minutes of aerobic exercise without signs/symptoms of physical distress   Intensity THRR unchanged     Progression   Progression Continue to progress workloads to maintain intensity without signs/symptoms of physical distress.   Average METs 2     Resistance Training   Training Prescription Yes   Weight 2 lbs   Reps 10-12     Interval Training   Interval Training No      Treadmill   MPH 1.4   Grade 0   Minutes 15   METs 2.07     NuStep   Level 3   Minutes 15   METs 2.4     REL-XR   Level 2   Minutes 15   METs 1.5     Home Exercise Plan   Plans to continue exercise at Home  walking   Frequency Add 2 additional days to program exercise sessions.      Goals Met:  Independence with exercise equipment Exercise tolerated well No report of cardiac concerns or symptoms Strength training completed today  Goals Unmet:  Not Applicable  Comments: Pt able to follow exercise prescription today without complaint.  Will continue to monitor for progression.    Dr. Emily Filbert is Medical Director for [place] and [facility].

## 2016-12-14 ENCOUNTER — Encounter: Payer: Self-pay | Admitting: Family Medicine

## 2016-12-14 ENCOUNTER — Ambulatory Visit (INDEPENDENT_AMBULATORY_CARE_PROVIDER_SITE_OTHER): Payer: Medicare Other | Admitting: Family Medicine

## 2016-12-14 VITALS — BP 106/60 | HR 60 | Temp 98.1°F | Resp 18 | Wt 209.0 lb

## 2016-12-14 DIAGNOSIS — M5090 Cervical disc disorder, unspecified, unspecified cervical region: Secondary | ICD-10-CM

## 2016-12-14 DIAGNOSIS — G4700 Insomnia, unspecified: Secondary | ICD-10-CM | POA: Diagnosis not present

## 2016-12-14 MED ORDER — TIZANIDINE HCL 4 MG PO TABS: 4.0000 mg | ORAL_TABLET | Freq: Every day | ORAL | 1 refills | Status: DC

## 2016-12-14 NOTE — Progress Notes (Signed)
Patient: James LORTIE Sr. Male    DOB: 03-17-1929   82 y.o.   MRN: [id number] Visit Date: 12/14/2016  Today's Provider: Lelon Huh, MD   Chief Complaint  Patient presents with  . Neck Pain   Subjective:    Neck Pain   This is a recurrent problem. The current episode started more than 1 month ago (Patient was last seen for cervical neck pain 5 months ago. Hydrocodone-Acetaminophen was refilled during that visit. An MRI was ordered, but patients insurance required a trial of PT before covering that procedure). The problem occurs constantly. The problem has been gradually worsening. Associated symptoms include numbness, tingling and weakness. Pertinent negatives include no chest pain, fever, headaches or leg pain. Treatments tried: Physical therapy and Hydrocodone-Acetaminophen. The treatment provided mild relief.  Pain seems to getting worse with burning and stinging in neck. Was referred to physical therapy which he did for 6 weeks with no improvement.  Was seen by Dr. Sharlet Salina in April and recommended physical therapy, home exercises and prescribed prednisone which didn't seem to help.   Also complains he is having more trouble sleeping. Is taking lorazepam at bedtime and sleeps for a few hours.     Allergies  Allergen Reactions  . Indomethacin Nausea Only     Current Outpatient Prescriptions:  .  albuterol (PROAIR HFA) 108 (90 Base) MCG/ACT inhaler, Inhale 2 puffs into the lungs every 6 (six) hours as needed for wheezing or shortness of breath., Disp: 1 Inhaler, Rfl: 2 .  aspirin EC 325 MG tablet, Take by mouth., Disp: , Rfl:  .  atorvastatin (LIPITOR) 80 MG tablet, Take 1 tablet (80 mg total) by mouth daily., Disp: 90 tablet, Rfl: 4 .  azelastine (OPTIVAR) 0.05 % ophthalmic solution, Apply 1 drop to eye 2 (two) times daily., Disp: 6 mL, Rfl: 5 .  benazepril-hydrochlorthiazide (LOTENSIN HCT) 20-12.5 MG tablet, Take 1 tablet by mouth daily., Disp: 90 tablet, Rfl: 1 .  beta  carotene w/minerals (OCUVITE) tablet, Take 1 tablet by mouth daily., Disp: , Rfl:  .  budesonide-formoterol (SYMBICORT) 160-4.5 MCG/ACT inhaler, Inhale 2 puffs into the lungs 2 (two) times daily., Disp: 1 Inhaler, Rfl: 12 .  fluticasone (FLONASE) 50 MCG/ACT nasal spray, Place 2 sprays into both nostrils daily., Disp: 48 g, Rfl: 5 .  furosemide (LASIX) 20 MG tablet, Take 1 tablet (20 mg total) by mouth daily., Disp: 30 tablet, Rfl: 0 .  HYDROcodone-acetaminophen (NORCO/VICODIN) 5-325 MG tablet, Take 1 tablet by mouth 2 (two) times daily as needed., Disp: 30 tablet, Rfl: 0 .  lansoprazole (PREVACID) 30 MG capsule, TAKE 1 CAPSULE EVERY DAY, Disp: 30 capsule, Rfl: 6 .  LORazepam (ATIVAN) 1 MG tablet, Take 1 tablet (1 mg total) by mouth at bedtime as needed for anxiety., Disp: 30 tablet, Rfl: 5 .  Magnesium 400 MG CAPS, Take 1 capsule by mouth daily. , Disp: , Rfl:  .  metoprolol (LOPRESSOR) 50 MG tablet, Take 1 tablet (50 mg total) by mouth 2 (two) times daily., Disp: 180 tablet, Rfl: 4 .  montelukast (SINGULAIR) 10 MG tablet, TAKE 1 TABLET BY MOUTH DAILY, Disp: 30 tablet, Rfl: 12 .  MULTIPLE VITAMIN PO, Take 1 tablet by mouth daily. , Disp: , Rfl:  .  naproxen (NAPROSYN) 500 MG tablet, Take 500 mg by mouth 2 (two) times daily as needed. , Disp: , Rfl:  .  OMEGA-3 FATTY ACIDS PO, Take 1,000 mg by mouth daily. , Disp: ,  Rfl:  .  SPIRIVA HANDIHALER 18 MCG inhalation capsule, INHALE 1 CAPSULE AS DIRECTED ONCE A DAY, Disp: 30 capsule, Rfl: 5 .  tamsulosin (FLOMAX) 0.4 MG CAPS capsule, Take 1 capsule (0.4 mg total) by mouth daily., Disp: 90 capsule, Rfl: 4 .  traMADol (ULTRAM) 50 MG tablet, Take 50 mg by mouth 2 (two) times daily. , Disp: , Rfl:   Review of Systems  Constitutional: Positive for fatigue. Negative for appetite change, chills and fever.  Respiratory: Negative for chest tightness, shortness of breath and wheezing.   Cardiovascular: Negative for chest pain and palpitations.  Gastrointestinal:  Negative for abdominal pain, nausea and vomiting.  Musculoskeletal: Positive for neck pain (right side) and neck stiffness.  Neurological: Positive for tingling, weakness and numbness. Negative for headaches.    Social History  Substance Use Topics  . Smoking status: Former Smoker    Packs/day: 1.00    Years: 11.00    Types: Cigarettes    Quit date: 12/18/1978  . Smokeless tobacco: Never Used  . Alcohol use 0.0 oz/week     Comment: Moderate use; 2 beers per week   Objective:   BP 106/60 (BP Location: Left Arm, Patient Position: Sitting, Cuff Size: Large)   Pulse 60   Temp 98.1 F (36.7 C) (Oral)   Resp 18   Wt 209 lb (94.8 kg)   BMI 34.78 kg/m   Physical Exam  General appearance: alert, well developed, well nourished, cooperative and in no distress Head: Normocephalic, without obvious abnormality, atraumatic Respiratory: Respirations even and unlabored, normal respiratory rate Extremities:Tender mid and lower c-spine. Pain in neck and shoulder exacerbated by lateral neck flexion and extension. +5 muscle strength both UEs. Diminished somatosensation     Assessment & Plan:     1. Cervical neck pain with evidence of disc disease Failed 6 weeks physical therapy, no improvement with oxycodone or prendisone.  - MR CERVICAL SPINE W CONTRAST; Future - tiZANidine (ZANAFLEX) 4 MG tablet; Take 1 tablet (4 mg total) by mouth at bedtime.  Dispense: 30 tablet; Refill: 1  2. Insomnia, unspecified type May improve with nighttime dose of muscle relaxer as above.        Lelon Huh, MD  [facility]

## 2016-12-15 ENCOUNTER — Encounter: Payer: Medicare Other | Admitting: *Deleted

## 2016-12-15 ENCOUNTER — Other Ambulatory Visit: Payer: Self-pay | Admitting: Family Medicine

## 2016-12-15 ENCOUNTER — Telehealth: Payer: Self-pay | Admitting: Family Medicine

## 2016-12-15 DIAGNOSIS — J449 Chronic obstructive pulmonary disease, unspecified: Secondary | ICD-10-CM

## 2016-12-15 DIAGNOSIS — M5412 Radiculopathy, cervical region: Secondary | ICD-10-CM

## 2016-12-15 DIAGNOSIS — R05 Cough: Secondary | ICD-10-CM | POA: Diagnosis not present

## 2016-12-15 NOTE — Telephone Encounter (Signed)
Please enter order,Thanks

## 2016-12-15 NOTE — Progress Notes (Signed)
Daily Session Note  Patient Details  Name: James ZAPF Sr. MRN: [id number] Date of Birth: 1929-09-23 Referring Provider:   Flowsheet Row Pulmonary Rehab from 10/24/2016 in [facility] Cardiac and Pulmonary Rehab  Referring Provider  Ramachandran      Encounter Date: 12/15/2016  Check In:     Session Check In - 12/15/16 1141      Check-In   Location [facility]-Cardiac & Pulmonary Rehab   Staff Present Gerlene Burdock, RN, Levie Heritage, MA, ACSM RCEP, Exercise Physiologist;Patricia Surles RN BSN   Supervising physician immediately available to respond to emergencies [facility] immediately available ER MD   Physician(s) Dr. Corky Downs and Dr. Marcelene Butte   Medication changes reported     No   Fall or balance concerns reported    No   Warm-up and Cool-down Performed as group-led instruction   Resistance Training Performed Yes   VAD Patient? No     Pain Assessment   Currently in Pain? No/denies         Goals Met:  Proper associated with RPD/PD & O2 Sat Exercise tolerated well  Goals Unmet:  Not Applicable  Comments:     Dr. Emily Filbert is Medical Director for [place] and [facility].

## 2016-12-15 NOTE — Telephone Encounter (Signed)
Without contrast please

## 2016-12-15 NOTE — Telephone Encounter (Signed)
Called into [facility]

## 2016-12-15 NOTE — Telephone Encounter (Signed)
Order in Epic!

## 2016-12-15 NOTE — Telephone Encounter (Signed)
Please call in lorazepam.  

## 2016-12-15 NOTE — Telephone Encounter (Signed)
Per [facility] MRI cervical spine must be ordered either without contrast or W/WO.If looking for disc problem w/o contrast.If mass is suspected it is w/wo

## 2016-12-20 ENCOUNTER — Encounter: Payer: Medicare Other | Attending: Internal Medicine | Admitting: Respiratory Therapy

## 2016-12-20 DIAGNOSIS — J449 Chronic obstructive pulmonary disease, unspecified: Secondary | ICD-10-CM

## 2016-12-20 DIAGNOSIS — R0609 Other forms of dyspnea: Secondary | ICD-10-CM | POA: Insufficient documentation

## 2016-12-20 DIAGNOSIS — R05 Cough: Secondary | ICD-10-CM | POA: Insufficient documentation

## 2016-12-20 NOTE — Progress Notes (Signed)
Daily Session Note  Patient Details  Name: CORNELUIS ALLSTON Sr. MRN: [id number] Date of Birth: 18-Feb-1929 Referring Provider:   Flowsheet Row Pulmonary Rehab from 10/24/2016 in [facility] Cardiac and Pulmonary Rehab  Referring Provider  Ramachandran      Encounter Date: 12/20/2016  Check In:     Session Check In - 12/20/16 1148      Check-In   Location [facility]-Cardiac & Pulmonary Rehab   Staff Present Carson Myrtle, BS, RRT, Respiratory Lennie Hummer, MA, ACSM RCEP, Exercise Physiologist;Patricia Surles RN BSN   Supervising physician immediately available to respond to emergencies [facility] immediately available ER MD   Physician(s) Drs Clearnce Hasten & Joni Fears   Medication changes reported     No   Fall or balance concerns reported    No   Warm-up and Cool-down Performed as group-led instruction   Resistance Training Performed Yes   VAD Patient? No     Pain Assessment   Currently in Pain? No/denies   Multiple Pain Sites No           Exercise Prescription Changes - 12/20/16 1300      Exercise Review   Progression Yes     Response to Exercise   Blood Pressure (Admit) 126/69   Blood Pressure (Exercise) 136/64   Blood Pressure (Exit) 116/64   Heart Rate (Admit) 98 bpm   Heart Rate (Exercise) 95 bpm   Heart Rate (Exit) 76 bpm   Oxygen Saturation (Admit) 95 %   Oxygen Saturation (Exercise) 93 %   Oxygen Saturation (Exit) 96 %   Rating of Perceived Exertion (Exercise) 13   Perceived Dyspnea (Exercise) 4   Symptoms none   Comments Home Exercise Guidelines given 11/24/16   Duration Progress to 45 minutes of aerobic exercise without signs/symptoms of physical distress   Intensity THRR unchanged     Progression   Progression Continue to progress workloads to maintain intensity without signs/symptoms of physical distress.   Average METs 1.89     Resistance Training   Training Prescription Yes   Weight 2 lbs   Reps 10-12     Interval Training   Interval Training No      Treadmill   MPH 1.4   Grade 0   Minutes 15   METs 2.07     NuStep   Level 4   Minutes 15   METs 2.3     REL-XR   Level 2   Minutes 15   METs 1.5     Home Exercise Plan   Plans to continue exercise at Home  walking   Frequency Add 2 additional days to program exercise sessions.      Goals Met:  Proper associated with RPD/PD & O2 Sat Independence with exercise equipment Using PLB without cueing & demonstrates good technique Exercise tolerated well Strength training completed today  Goals Unmet:  Not Applicable  Comments: Pt able to follow exercise prescription today without complaint.  Will continue to monitor for progression.    Dr. Emily Filbert is Medical Director for [place] and [facility].

## 2016-12-20 NOTE — Progress Notes (Signed)
Daily Session Note  Patient Details  Name: James CALIENDO Sr. MRN: [id number] Date of Birth: 1929/07/02 Referring Provider:   Flowsheet Row Pulmonary Rehab from 10/24/2016 in [facility] Cardiac and Pulmonary Rehab  Referring Provider  Ramachandran      Encounter Date: 12/20/2016  Check In:     Session Check In - 12/20/16 1148      Check-In   Location [facility]-Cardiac & Pulmonary Rehab   Staff Present Carson Myrtle, BS, RRT, Respiratory Lennie Hummer, MA, ACSM RCEP, Exercise Physiologist;Isra Lindy RN BSN   Supervising physician immediately available to respond to emergencies [facility] immediately available ER MD   Physician(s) Drs Clearnce Hasten & Joni Fears   Medication changes reported     No   Fall or balance concerns reported    No   Warm-up and Cool-down Performed as group-led instruction   Resistance Training Performed Yes   VAD Patient? No     Pain Assessment   Currently in Pain? No/denies   Multiple Pain Sites No         Goals Met:  Proper associated with RPD/PD & O2 Sat Independence with exercise equipment Using PLB without cueing & demonstrates good technique Exercise tolerated well No report of cardiac concerns or symptoms Strength training completed today  Goals Unmet:  Not Applicable  Comments: Pt able to follow exercise prescription today without complaint.  Will continue to monitor for progression.    Dr. Emily Filbert is Medical Director for [place] and [facility].

## 2016-12-22 ENCOUNTER — Encounter: Payer: Medicare Other | Admitting: *Deleted

## 2016-12-22 DIAGNOSIS — R05 Cough: Secondary | ICD-10-CM | POA: Diagnosis not present

## 2016-12-22 DIAGNOSIS — J449 Chronic obstructive pulmonary disease, unspecified: Secondary | ICD-10-CM

## 2016-12-22 NOTE — Progress Notes (Signed)
Daily Session Note  Patient Details  Name: James DORR Sr. MRN: [id number] Date of Birth: Jul 15, 1929 Referring Provider:   Flowsheet Row Pulmonary Rehab from 10/24/2016 in [facility] Cardiac and Pulmonary Rehab  Referring Provider  Ramachandran      Encounter Date: 12/22/2016  Check In:     Session Check In - 12/22/16 1147      Check-In   Location [facility]-Cardiac & Pulmonary Rehab   Staff Present Gerlene Burdock, RN, Levie Heritage, MA, ACSM RCEP, Exercise Physiologist;Darriana Deboy RN BSN   Supervising physician immediately available to respond to emergencies [facility] immediately available ER MD   Physician(s) Drs. Paduchowski & Williams   Medication changes reported     No   Fall or balance concerns reported    No   Warm-up and Cool-down Performed as group-led Location manager Performed Yes   VAD Patient? No     Pain Assessment   Currently in Pain? No/denies   Multiple Pain Sites No         Goals Met:  Proper associated with RPD/PD & O2 Sat Independence with exercise equipment Using PLB without cueing & demonstrates good technique Exercise tolerated well No report of cardiac concerns or symptoms Strength training completed today  Goals Unmet:  Not Applicable  Comments: Pt able to follow exercise prescription today without complaint.  Will continue to monitor for progression.    Dr. Emily Filbert is Medical Director for [place] and [facility].

## 2016-12-25 ENCOUNTER — Encounter: Payer: Self-pay | Admitting: *Deleted

## 2016-12-25 ENCOUNTER — Telehealth: Payer: Self-pay | Admitting: *Deleted

## 2016-12-25 DIAGNOSIS — R05 Cough: Secondary | ICD-10-CM | POA: Diagnosis not present

## 2016-12-25 DIAGNOSIS — R0609 Other forms of dyspnea: Secondary | ICD-10-CM | POA: Diagnosis not present

## 2016-12-25 NOTE — Addendum Note (Signed)
Addended by: Gerlene Burdock on: 12/25/2016 10:25 AM   Modules accepted: Miquel Dunn
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
## 2016-12-25 NOTE — Progress Notes (Signed)
This encounter was created in error - please disregard.

## 2016-12-26 ENCOUNTER — Other Ambulatory Visit: Payer: Self-pay | Admitting: *Deleted

## 2016-12-26 MED ORDER — BENAZEPRIL-HYDROCHLOROTHIAZIDE 20-12.5 MG PO TABS: 1.0000 | ORAL_TABLET | Freq: Every day | ORAL | 3 refills | Status: DC

## 2016-12-27 ENCOUNTER — Encounter: Payer: Medicare Other | Admitting: *Deleted

## 2016-12-27 DIAGNOSIS — J449 Chronic obstructive pulmonary disease, unspecified: Secondary | ICD-10-CM

## 2016-12-27 DIAGNOSIS — R05 Cough: Secondary | ICD-10-CM | POA: Diagnosis not present

## 2016-12-27 NOTE — Progress Notes (Signed)
Daily Session Note  Patient Details  Name: James BRAHM Sr. MRN: [id number] Date of Birth: [birth year] Referring Provider:   Flowsheet Row Pulmonary Rehab from 10/24/2016 in [facility] Cardiac and Pulmonary Rehab  Referring Provider  Ramachandran      Encounter Date: 12/27/2016  Check In:     Session Check In - 12/27/16 1124      Check-In   Location [facility]-Cardiac & Pulmonary Rehab   Staff Present Alberteen Sam, MA, ACSM RCEP, Exercise Physiologist;Laureen Owens Shark, BS, RRT, Respiratory Therapist;Lokelani Lutes RN BSN   Supervising physician immediately available to respond to emergencies [facility] immediately available ER MD   Physician(s) Drs. Darl Householder and Alfred Levins   Medication changes reported     No   Fall or balance concerns reported    No   Warm-up and Cool-down Performed as group-led Location manager Performed Yes   VAD Patient? No     Pain Assessment   Currently in Pain? No/denies   Multiple Pain Sites No         Goals Met:  Independence with exercise equipment Using PLB without cueing & demonstrates good technique Exercise tolerated well No report of cardiac concerns or symptoms Strength training completed today  Goals Unmet:  Not Applicable  Comments:Pt able to follow exercise prescription today without complaint at reduced workloads due to hip pain (chronic pain flare-up) that kept him out of class earlier this week as well.  Will continue to monitor for progression.    Dr. Emily Filbert is Medical Director for [place] and [facility].

## 2016-12-28 ENCOUNTER — Ambulatory Visit
Admission: RE | Admit: 2016-12-28 | Discharge: 2016-12-28 | Disposition: A | Discharge status: Home / Self Care | Payer: Medicare Other | Source: Ambulatory Visit | Attending: Family Medicine | Admitting: Family Medicine

## 2016-12-28 DIAGNOSIS — M50321 Other cervical disc degeneration at C4-C5 level: Secondary | ICD-10-CM | POA: Insufficient documentation

## 2016-12-28 DIAGNOSIS — M542 Cervicalgia: Secondary | ICD-10-CM | POA: Insufficient documentation

## 2016-12-28 DIAGNOSIS — M50222 Other cervical disc displacement at C5-C6 level: Secondary | ICD-10-CM | POA: Diagnosis not present

## 2016-12-28 DIAGNOSIS — R200 Anesthesia of skin: Secondary | ICD-10-CM | POA: Insufficient documentation

## 2016-12-28 DIAGNOSIS — M25511 Pain in right shoulder: Secondary | ICD-10-CM | POA: Diagnosis not present

## 2016-12-28 DIAGNOSIS — M5031 Other cervical disc degeneration,  high cervical region: Secondary | ICD-10-CM | POA: Insufficient documentation

## 2016-12-28 DIAGNOSIS — M47812 Spondylosis without myelopathy or radiculopathy, cervical region: Secondary | ICD-10-CM | POA: Diagnosis not present

## 2016-12-28 DIAGNOSIS — M5412 Radiculopathy, cervical region: Secondary | ICD-10-CM

## 2016-12-28 DIAGNOSIS — M25512 Pain in left shoulder: Secondary | ICD-10-CM | POA: Insufficient documentation

## 2016-12-29 ENCOUNTER — Telehealth: Payer: Self-pay

## 2016-12-29 DIAGNOSIS — J449 Chronic obstructive pulmonary disease, unspecified: Secondary | ICD-10-CM

## 2016-12-29 DIAGNOSIS — G542 Cervical root disorders, not elsewhere classified: Secondary | ICD-10-CM

## 2016-12-29 DIAGNOSIS — R05 Cough: Secondary | ICD-10-CM | POA: Diagnosis not present

## 2016-12-29 MED ORDER — PREDNISONE 10 MG (48) PO TBPK: ORAL_TABLET | ORAL | 0 refills | Status: DC

## 2016-12-29 NOTE — Telephone Encounter (Signed)
-----   Message from Birdie Sons, MD sent at 12/29/2016  8:04 AM EST ----- MRI shows pinching of several nerves in his neck and some swelling around spine. Need to start on prednisone '10mg'$  12 day taper (6 for 2 days, 5 fo r 2days, etc..[name]#[id number]) and need referral to neurosurgery for further evaluation.

## 2016-12-29 NOTE — Progress Notes (Unsigned)
Daily Session Note  Patient Details  Name: James BOEDECKER Sr. MRN: [id number] Date of Birth: [birth year] Referring Provider:   Flowsheet Row Pulmonary Rehab from 10/24/2016 in [facility] and [facility] Rehab  Referring Provider  Ramachandran      Encounter Date: 12/29/2016  Check In:     Session Check In - 12/29/16 1159      Check-In   Location [facility]-Cardiac & Pulmonary Rehab   Staff Present Heath Lark, RN, BSN, CCRP;Jessica Winthrop, [place], ACSM RCEP, Exercise Physiologist;Lindel Marcell RN BSN   Supervising physician immediately available to respond to emergencies [facility] immediately available ER MD   Physician(s) Drs. McShane & Kinner   Medication changes reported     No   Fall or balance concerns reported    No   Warm-up and Cool-down Performed as group-led Location manager Performed Yes   VAD Patient? No     Pain Assessment   Currently in Pain? No/denies   Multiple Pain Sites No         Goals Met:  Proper associated with RPD/PD & O2 Sat Independence with exercise equipment Using PLB without cueing & demonstrates good technique Exercise tolerated well No report of cardiac concerns or symptoms Strength training completed today  Goals Unmet:  Not Applicable  Comments: Pt able to follow exercise prescription today without complaint.  Will continue to monitor for progression.    Dr. Emily Filbert is Medical Director for [place] and [facility].

## 2017-01-01 ENCOUNTER — Encounter: Payer: Self-pay | Admitting: Respiratory Therapy

## 2017-01-01 DIAGNOSIS — J449 Chronic obstructive pulmonary disease, unspecified: Secondary | ICD-10-CM

## 2017-01-01 DIAGNOSIS — R05 Cough: Secondary | ICD-10-CM | POA: Diagnosis not present

## 2017-01-01 NOTE — Progress Notes (Signed)
Pulmonary Individual Treatment Plan  Patient Details  Name: James KSIAZEK Sr. MRN: [id number] Date of Birth: July 02, 1929 Referring Provider:   Flowsheet Row Pulmonary Rehab from 10/24/2016 in [facility] Cardiac and Pulmonary Rehab  Referring Provider  Ramachandran      Initial Encounter Date:  Flowsheet Row Pulmonary Rehab from 10/24/2016 in [facility] Cardiac and Pulmonary Rehab  Date  10/24/16  Referring Provider  Ashby Dawes      Visit Diagnosis: COPD, mild ([place])  Patient's Home Medications on Admission:  Current Outpatient Prescriptions:    albuterol (PROAIR HFA) 108 (90 Base) MCG/ACT inhaler, Inhale 2 puffs into the lungs every 6 (six) hours as needed for wheezing or shortness of breath., Disp: 1 Inhaler, Rfl: 2   aspirin EC 325 MG tablet, Take by mouth., Disp: , Rfl:    atorvastatin (LIPITOR) 80 MG tablet, Take 1 tablet (80 mg total) by mouth daily., Disp: 90 tablet, Rfl: 4   azelastine (OPTIVAR) 0.05 % ophthalmic solution, Apply 1 drop to eye 2 (two) times daily., Disp: 6 mL, Rfl: 5   benazepril-hydrochlorthiazide (LOTENSIN HCT) 20-12.5 MG tablet, Take 1 tablet by mouth daily., Disp: 90 tablet, Rfl: 3   beta carotene w/minerals (OCUVITE) tablet, Take 1 tablet by mouth daily., Disp: , Rfl:    budesonide-formoterol (SYMBICORT) 160-4.5 MCG/ACT inhaler, Inhale 2 puffs into the lungs 2 (two) times daily., Disp: 1 Inhaler, Rfl: 12   fluticasone (FLONASE) 50 MCG/ACT nasal spray, Place 2 sprays into both nostrils daily., Disp: 48 g, Rfl: 5   furosemide (LASIX) 20 MG tablet, Take 1 tablet (20 mg total) by mouth daily., Disp: 30 tablet, Rfl: 0   HYDROcodone-acetaminophen (NORCO/VICODIN) 5-325 MG tablet, Take 1 tablet by mouth 2 (two) times daily as needed., Disp: 30 tablet, Rfl: 0   lansoprazole (PREVACID) 30 MG capsule, TAKE 1 CAPSULE EVERY DAY, Disp: 30 capsule, Rfl: 6   LORazepam (ATIVAN) 1 MG tablet, TAKE ONE TABLET BY MOUTH AT BEDTIME AS NEEDED FOR ANXIETY, Disp: 30 tablet, Rfl: 4    Magnesium 400 MG CAPS, Take 1 capsule by mouth daily. , Disp: , Rfl:    metoprolol (LOPRESSOR) 50 MG tablet, Take 1 tablet (50 mg total) by mouth 2 (two) times daily., Disp: 180 tablet, Rfl: 4   montelukast (SINGULAIR) 10 MG tablet, TAKE 1 TABLET BY MOUTH DAILY, Disp: 30 tablet, Rfl: 12   MULTIPLE VITAMIN PO, Take 1 tablet by mouth daily. , Disp: , Rfl:    OMEGA-3 FATTY ACIDS PO, Take 1,000 mg by mouth daily. , Disp: , Rfl:    predniSONE (STERAPRED UNI-PAK 48 TAB) 10 MG (48) TBPK tablet, As directed, Disp: 48 tablet, Rfl: 0   SPIRIVA HANDIHALER 18 MCG inhalation capsule, INHALE 1 CAPSULE AS DIRECTED ONCE A DAY, Disp: 30 capsule, Rfl: 5   tamsulosin (FLOMAX) 0.4 MG CAPS capsule, Take 1 capsule (0.4 mg total) by mouth daily., Disp: 90 capsule, Rfl: 4   tiZANidine (ZANAFLEX) 4 MG tablet, Take 1 tablet (4 mg total) by mouth at bedtime., Disp: 30 tablet, Rfl: 1   traMADol (ULTRAM) 50 MG tablet, Take 50 mg by mouth 2 (two) times daily. , Disp: , Rfl:   Past Medical History: Past Medical History:  Diagnosis Date   Back pain    CAD (coronary artery disease)    Diverticulosis    GERD (gastroesophageal reflux disease)    History of chicken pox    History of measles    History of mumps    Hyperglycemia    Hyperlipidemia  Hypertension    Hypotension    Prostate cancer ([place])    TIA (transient ischemic attack)     Tobacco Use: History  Smoking Status   Former Smoker   Packs/day: 1.00   Years: 11.00   Types: Cigarettes   Quit date: 12/18/1978  Smokeless Tobacco   Never Used    Labs: Recent Review Flowsheet Data    Labs for ITP Cardiac and Pulmonary Rehab Latest Ref Rng & Units 10/05/2014 10/07/2014   Cholestrol 0 - 200 mg/dL - 104   LDLCALC mg/dL - 40   HDL 35 - 70 mg/dL - 52   Trlycerides 40 - 160 mg/dL - 61   Hemoglobin A1c 4.0 - 6.0 % 5.7 5.7       ADL UCSD:     Pulmonary Assessment Scores    Row Name 10/24/16 1213 12/27/16 1134       ADL  UCSD   ADL Phase Entry Mid    SOB Score total 29 64    Rest 1 1    Walk 2 3    Stairs 3 4    Bath 3 4    Dress 2 3    Shop 1 3       Pulmonary Function Assessment:     Pulmonary Function Assessment - 10/24/16 1212      Initial Spirometry Results   FVC% 37 %   FEV1% 47 %   FEV1/FVC Ratio 87.3     Breath   Bilateral Breath Sounds Clear   Shortness of Breath Yes;Limiting activity      Exercise Target Goals:    Exercise Program Goal: Individual exercise prescription set with THRR, safety & activity barriers. Participant demonstrates ability to understand and report RPE using BORG scale, to self-measure pulse accurately, and to acknowledge the importance of the exercise prescription.  Exercise Prescription Goal: Starting with aerobic activity 30 plus minutes a day, 3 days per week for initial exercise prescription. Provide home exercise prescription and guidelines that participant acknowledges understanding prior to discharge.  Activity Barriers & Risk Stratification:     Activity Barriers & Cardiac Risk Stratification - 10/24/16 1211      Activity Barriers & Cardiac Risk Stratification   Activity Barriers Back Problems;Arthritis;Shortness of Breath;Deconditioning      6 Minute Walk:     6 Minute Walk    Row Name 10/24/16 1258 12/20/16 1350       6 Minute Walk   Phase  -- Mid Program    Distance 800 feet 1000 feet    Distance % Change  -- 25 %  200 ft    Walk Time 6 minutes 6 minutes    # of Rest Breaks 0 0    MPH 1.5 1.89    METS 2.15 2.45    RPE 17 17    Perceived Dyspnea  4 5    VO2 Peak 1.9 4.18    Symptoms Yes (comment) Yes (comment)    Comments left hip pain more than breathing left hip pain    Resting HR 67 bpm 81 bpm    Resting BP 126/62 126/69    Max Ex. HR 110 bpm 102 bpm    Max Ex. BP 150/70 136/64    2 Minute Post BP  -- 134/64      Interval HR   Baseline HR 67 81    1 Minute HR 92 91    2 Minute HR 99 94    3 Minute HR 96  99    4  Minute HR 110 100    5 Minute HR 99 101    6 Minute HR 100 102    2 Minute Post HR 81 87    Interval Heart Rate? Yes Yes      Interval Oxygen   Interval Oxygen? Yes Yes    Baseline Oxygen Saturation % 96 % 95 %    Baseline Liters of Oxygen  -- 0 L  Room Air    1 Minute Oxygen Saturation % 92 % 96 %    1 Minute Liters of Oxygen  -- 0 L    2 Minute Oxygen Saturation % 92 % 94 %    2 Minute Liters of Oxygen  -- 0 L    3 Minute Oxygen Saturation % 91 % 94 %    3 Minute Liters of Oxygen  -- 0 L    4 Minute Oxygen Saturation % 93 % 92 %    4 Minute Liters of Oxygen  -- 0 L    5 Minute Oxygen Saturation % 91 % 93 %    5 Minute Liters of Oxygen  -- 0 L    6 Minute Oxygen Saturation % 93 % 93 %    6 Minute Liters of Oxygen  -- 0 L    2 Minute Post Oxygen Saturation % 96 % 97 %    2 Minute Post Liters of Oxygen  -- 0 L       Initial Exercise Prescription:     Initial Exercise Prescription - 10/24/16 1300      Date of Initial Exercise RX and Referring Provider   Date 10/24/16   Referring Provider Ramachandran     Treadmill   MPH 1.4   Grade 0   Minutes 15  5/5/5   METs 2.07     NuStep   Level 2   Minutes 15   METs 2     T5 Nustep   Level 1   Minutes 15   METs 2     Intensity   THRR 40-80% of Max Heartrate 94-121   Ratings of Perceived Exertion 11-13   Perceived Dyspnea 0-4     Progression   Progression Continue to progress workloads to maintain intensity without signs/symptoms of physical distress.     Resistance Training   Training Prescription Yes   Weight 2   Reps 10-12      Perform Capillary Blood Glucose checks as needed.  Exercise Prescription Changes:     Exercise Prescription Changes    Row Name 10/30/16 1200 11/07/16 1600 11/21/16 1500 11/24/16 1200 12/06/16 1400     Exercise Review   Progression  -- Yes Yes Yes Yes     Response to Exercise   Blood Pressure (Admit) 140/60 136/70 112/84  -- 124/72   Blood Pressure (Exercise) 132/68 110/72  118/74  -- 140/68   Blood Pressure (Exit)  -- 122/80 118/68  -- 98/58   Heart Rate (Admit) 67 bpm 70 bpm 72 bpm  -- 68 bpm   Heart Rate (Exercise) 75 bpm 92 bpm 101 bpm  -- 105 bpm   Heart Rate (Exit)  -- 82 bpm 61 bpm  -- 68 bpm   Oxygen Saturation (Admit) 97 % 92 % 97 %  -- 97 %   Oxygen Saturation (Exercise) 95 %  Room air 92 % 94 % 94 % 95 %   Oxygen Saturation (Exit)  -- 95 % 98 %  --  96 %   Rating of Perceived Exertion (Exercise) _0 -- 13   Perceived Dyspnea (Exercise) _1 -- 3   Symptoms --  new knee pain which felt better with exercise.  -- none none none   Comments  --  --  -- Home Exercise Guidelines given 11/24/16 Home Exercise Guidelines given 11/24/16   Duration  -- Progress to 45 minutes of aerobic exercise without signs/symptoms of physical distress Progress to 45 minutes of aerobic exercise without signs/symptoms of physical distress Progress to 45 minutes of aerobic exercise without signs/symptoms of physical distress Progress to 45 minutes of aerobic exercise without signs/symptoms of physical distress   Intensity  -- THRR unchanged THRR unchanged THRR unchanged THRR unchanged     Progression   Progression  -- Continue to progress workloads to maintain intensity without signs/symptoms of physical distress. Continue to progress workloads to maintain intensity without signs/symptoms of physical distress. Continue to progress workloads to maintain intensity without signs/symptoms of physical distress. Continue to progress workloads to maintain intensity without signs/symptoms of physical distress.   Average METs  -- 1.99 _2 Resistance Training   Training Prescription _3    Weight 2 2 lbs 2 lbs 2 lbs 2 lbs   Reps 10-12 10-12 10-12 10-12 10-12     Interval Training   Interval Training  -- No No No No     Treadmill   MPH 1.4 1 1.2 1.2 1.4   Grade 0 0 0 0 0   Minutes 15  Did alternate equipment due to knee pain will cont to assess _4 METs 2.07 1.77 1.92 1.92 2.07     NuStep   Level _5 Minutes _6 METs 2 2.2 1.9 1.9 2.4     REL-XR   Level  --  -- _7 Watts  --  -- --  speed 40-50 --  speed 40-50  --   Minutes  --  -- _8 METs  --  -- 2.2 2.2 1.5     T5 Nustep   Level 1  --  --  --  --   Minutes 15  --  --  --  --   METs 2  --  --  --  --     Home Exercise Plan   Plans to continue exercise at  --  --  -- Home  walking Home  walking   Frequency  --  --  -- Add 2 additional days to program exercise sessions. Add 2 additional days to program exercise sessions.   Row Name 12/20/16 1300             Exercise Review   Progression Yes         Response to Exercise   Blood Pressure (Admit) 126/69       Blood Pressure (Exercise) 136/64       Blood Pressure (Exit) 116/64       Heart Rate (Admit) 98 bpm       Heart Rate (Exercise) 95 bpm       Heart Rate (Exit) 76 bpm       Oxygen Saturation (Admit) 95 %       Oxygen Saturation (Exercise) 93 %       Oxygen Saturation (Exit) 96 %  Rating of Perceived Exertion (Exercise) 13       Perceived Dyspnea (Exercise) 4       Symptoms none       Comments Home Exercise Guidelines given 11/24/16       Duration Progress to 45 minutes of aerobic exercise without signs/symptoms of physical distress       Intensity THRR unchanged         Progression   Progression Continue to progress workloads to maintain intensity without signs/symptoms of physical distress.       Average METs 1.89         Resistance Training   Training Prescription Yes       Weight 2 lbs       Reps 10-12         Interval Training   Interval Training No         Treadmill   MPH 1.4       Grade 0       Minutes 15       METs 2.07         NuStep   Level 4       Minutes 15       METs 2.3         REL-XR   Level 2       Minutes 15       METs 1.5         Home Exercise Plan   Plans to continue exercise at Home  walking       Frequency Add 2  additional days to program exercise sessions.          Exercise Comments:     Exercise Comments    Row Name 11/07/16 1620 11/21/16 1545 11/24/16 1209 11/24/16 1230 12/06/16 1453   Exercise Comments Shervin had a great first day on 11/13.  He is doing well.  We will continue to monitor his progression. Le continues to do well with exercise.  We will try to bump up some of his workloads.  He had bumped up the NuStep by accident, but did not return to the increase.  We will continue to monitor his progression. Reviewed METs average and discussed progression with pt today. Reviewed home exercise with pt today.  Pt plans to walk at home and at stores for exercise.  Reviewed THR, pulse, RPE, sign and symptoms, and when to call 911 or MD.  Also discussed weather considerations and indoor options.  Pt voiced understanding. Morio has been doing well with rehab.  He is already almost half way done.  We will be doing his mid 6MWT soon as he is on visit 15 today.  He has sped his treadmill up to 1.4 mph now.  We will continue to monitor Rockie's progression.   [place] Name 12/20/16 1352 12/27/16 1234         Exercise Comments Deniz has returned after being out sick for a week.  He did his mid 6MWT today and improved by 25%.  We will conitnue to monitor his porgression. Pt able to follow exercise prescription today without complaint at reduced workloads due to hip pain (chronic pain flare-up) that kept him out of class earlier this week as well.  Will continue to monitor for progression.         Discharge Exercise Prescription (Final Exercise Prescription Changes):     Exercise Prescription Changes - 12/20/16 1300      Exercise Review   Progression Yes  Response to Exercise   Blood Pressure (Admit) 126/69   Blood Pressure (Exercise) 136/64   Blood Pressure (Exit) 116/64   Heart Rate (Admit) 98 bpm   Heart Rate (Exercise) 95 bpm   Heart Rate (Exit) 76 bpm   Oxygen Saturation (Admit) 95 %   Oxygen  Saturation (Exercise) 93 %   Oxygen Saturation (Exit) 96 %   Rating of Perceived Exertion (Exercise) 13   Perceived Dyspnea (Exercise) 4   Symptoms none   Comments Home Exercise Guidelines given 11/24/16   Duration Progress to 45 minutes of aerobic exercise without signs/symptoms of physical distress   Intensity THRR unchanged     Progression   Progression Continue to progress workloads to maintain intensity without signs/symptoms of physical distress.   Average METs 1.89     Resistance Training   Training Prescription Yes   Weight 2 lbs   Reps 10-12     Interval Training   Interval Training No     Treadmill   MPH 1.4   Grade 0   Minutes 15   METs 2.07     NuStep   Level 4   Minutes 15   METs 2.3     REL-XR   Level 2   Minutes 15   METs 1.5     Home Exercise Plan   Plans to continue exercise at Home  walking   Frequency Add 2 additional days to program exercise sessions.       Nutrition:  Target Goals: Understanding of nutrition guidelines, daily intake of sodium <1573m, cholesterol <2023m calories 30% from fat and 7% or less from saturated fats, daily to have 5 or more servings of fruits and vegetables.  Biometrics:     Pre Biometrics - 10/24/16 1257      Pre Biometrics   Height 5' 5" (1.651 m)   Weight 207 lb 11.2 oz (94.2 kg)   Waist Circumference 44.25 inches   Hip Circumference 44.25 inches   Waist to Hip Ratio 1 %   BMI (Calculated) 34.6       Nutrition Therapy Plan and Nutrition Goals:     Nutrition Therapy & Goals - 10/24/16 1207      Intervention Plan   Intervention Prescribe, educate and counsel regarding individualized specific dietary modifications aiming towards targeted core components such as weight, hypertension, lipid management, diabetes, heart failure and other comorbidities.   Expected Outcomes Short Term Goal: Understand basic principles of dietary content, such as calories, fat, sodium, cholesterol and nutrients.;Short Term  Goal: A plan has been developed with personal nutrition goals set during dietitian appointment.;Long Term Goal: Adherence to prescribed nutrition plan.      Nutrition Discharge: Rate Your Plate Scores:     Nutrition Assessments - 10/30/16 1554      Rate Your Plate Scores   Pre Score 60   Pre Score % 66.7 %      Psychosocial: Target Goals: Acknowledge presence or absence of depression, maximize coping skills, provide positive support system. Participant is able to verbalize types and ability to use techniques and skills needed for reducing stress and depression.  Initial Review & Psychosocial Screening:     Initial Psych Review & Screening - 10/24/16 1209      Initial Review   Current issues with --  No concerns noted     Family Dynamics   Good Support System? Yes  Wife and neighbors     Barriers   Psychosocial barriers to participate in program There  are no identifiable barriers or psychosocial needs.;The patient should benefit from training in stress management and relaxation.     Screening Interventions   Interventions Encouraged to exercise      Quality of Life Scores:     Quality of Life - 10/24/16 1218      Quality of Life Scores   Health/Function Pre 19.81 %   Socioeconomic Pre 21 %   Psych/Spiritual Pre 21 %   Family Pre 21 %   GLOBAL Pre 20.46 %      PHQ-9: Recent Review Flowsheet Data    Depression screen [facility] 2/9 10/24/2016 12/03/2015   Decreased Interest 0 0   Down, Depressed, Hopeless 0 0   PHQ - 2 Score 0 0   Altered sleeping 1 -   Tired, decreased energy 2 -   Change in appetite 0 -   Feeling bad or failure about yourself  2  -   Trouble concentrating 1 -   Moving slowly or fidgety/restless 0 -   Suicidal thoughts 0 -   PHQ-9 Score 6 -   Difficult doing work/chores Somewhat difficult -      Psychosocial Evaluation and Intervention:     Psychosocial Evaluation - 11/06/16 1226      Psychosocial Evaluation & Interventions    Interventions Encouraged to exercise with the program and follow exercise prescription   Comments Counselor met with Mr. B today for initial psychosocial evaluation.  He is a well-adjusted 83 year old who has been diagnosed with mild COPD.  He has a wife of 74 years and is actively involved in his local church.  He has had CABGx5 in 2011 and hip replacement in 1997.  He also is in remission from Prostate Cancer in 2014.  Mr. B states he has "memory issues" as well., although he may have had some difficulty recalling information; it seemed more related to his lack of ability to hear - as he forgot his hearing aid today.  Mr. B states his sleep varies and his appetite is "too good."  He denies a history or current symptoms of depression or anxiety and states he is a positive person most of the time.  Mr. B has minimal stress in his life other than his health.  He reports his spouse walks (3) miles per day and he may join her once he completes this program.  His goals while here are to breathe better; increase his energy and possible lose some weight.        Psychosocial Re-Evaluation:     Psychosocial Re-Evaluation    [place] Name 11/22/16 1521 12/20/16 1242 12/25/16 1019         Psychosocial Re-Evaluation   Interventions  -- Encouraged to attend Pulmonary Rehabilitation for the exercise  --     Comments Eddie Dibbles psychosocial assessment reveals no barriers at this time to participation in Pulmonary Rehab.  he  has good family and friend support that encourages Emmanuel to participate in [place] and progress with His goals.  Nicolo concerns are monitored, but he  has acknowledge that attending the program has helped to maintain quality life with improved mobility, self-care, and emotional and financial stability.  Demetrios is commended for regular attendance and self-motivation to improve His pulmonary disease management. Jaser states that he is continuing to feel well mentally, and that he has "no worries".  He reports  that he tends to just take life situations as they come and doesn't worry.  Altough, he does admit that  he is sometimes "aggravated that I can't do things like I used to".  He continues to have good support from his family and remains self-motivated in the [facility] program.   Hasaan called to say he was sorry that he could not attend Pulm Rehab today because he is sick. There is also a chance of an ice storm today too.        Education: Education Goals: Education classes will be provided on a weekly basis, covering required topics. Participant will state understanding/return demonstration of topics presented.  Learning Barriers/Preferences:     Learning Barriers/Preferences - 10/24/16 1211      Learning Barriers/Preferences   Learning Barriers Hearing   Learning Preferences None      Education Topics: Initial Evaluation Education: - Verbal, written and demonstration of respiratory meds, RPE/PD scales, oximetry and breathing techniques. Instruction on use of nebulizers and MDIs: cleaning and proper use, rinsing mouth with steroid doses and importance of monitoring MDI activations.   General Nutrition Guidelines/Fats and Fiber: -Group instruction provided by verbal, written material, models and posters to present the general guidelines for heart healthy nutrition. Gives an explanation and review of dietary fats and fiber. Flowsheet Row Pulmonary Rehab from 12/27/2016 in [facility] Cardiac and Pulmonary Rehab  Date  11/06/16  Educator  CR  Instruction Review Code  2- meets goals/outcomes      Controlling Sodium/Reading Food Labels: -Group verbal and written material supporting the discussion of sodium use in heart healthy nutrition. Review and explanation with models, verbal and written materials for utilization of the food label. Flowsheet Row Pulmonary Rehab from 12/27/2016 in [facility] Cardiac and Pulmonary Rehab  Date  11/13/16  Educator  CR  Instruction Review Code  2- meets goals/outcomes       Exercise Physiology & Risk Factors: - Group verbal and written instruction with models to review the exercise physiology of the cardiovascular system and associated critical values. Details cardiovascular disease risk factors and the goals associated with each risk factor. Flowsheet Row Pulmonary Rehab from 12/27/2016 in [facility] Cardiac and Pulmonary Rehab  Date  12/20/16  Educator  [facility]  Instruction Review Code  2- meets goals/outcomes      Aerobic Exercise & Resistance Training: - Gives group verbal and written discussion on the health impact of inactivity. On the components of aerobic and resistive training programs and the benefits of this training and how to safely progress through these programs.   Flexibility, Balance, General Exercise Guidelines: - Provides group verbal and written instruction on the benefits of flexibility and balance training programs. Provides general exercise guidelines with specific guidelines to those with heart or lung disease. Demonstration and skill practice provided. Flowsheet Row Pulmonary Rehab from 12/27/2016 in [facility] Cardiac and Pulmonary Rehab  Date  12/06/16  Educator  AS  Instruction Review Code  2- meets goals/outcomes      Stress Management: - Provides group verbal and written instruction about the health risks of elevated stress, cause of high stress, and healthy ways to reduce stress. Flowsheet Row Pulmonary Rehab from 12/27/2016 in [facility] Cardiac and Pulmonary Rehab  Date  12/27/16  Educator  [facility]  Instruction Review Code  2- meets goals/outcomes      Depression: - Provides group verbal and written instruction on the correlation between heart/lung disease and depressed mood, treatment options, and the stigmas associated with seeking treatment. Flowsheet Row Pulmonary Rehab from 12/27/2016 in [facility] Cardiac and Pulmonary Rehab  Date  11/20/16  Educator  [facility]  Instruction  Review Code  2- meets goals/outcomes      Exercise & Equipment  Safety: - Individual verbal instruction and demonstration of equipment use and safety with use of the equipment. Flowsheet Row Pulmonary Rehab from 12/27/2016 in [facility] Cardiac and Pulmonary Rehab  Date  10/30/16  Educator  C. EnterkinRN  Instruction Review Code  2- meets goals/outcomes      Infection Prevention: - Provides verbal and written material to individual with discussion of infection control including proper hand washing and proper equipment cleaning during exercise session. Flowsheet Row Pulmonary Rehab from 12/27/2016 in [facility] Cardiac and Pulmonary Rehab  Date  10/30/16  Educator  C. EnterkinRN  Instruction Review Code  2- meets goals/outcomes      Falls Prevention: - Provides verbal and written material to individual with discussion of falls prevention and safety. Flowsheet Row Pulmonary Rehab from 12/27/2016 in [facility] Cardiac and Pulmonary Rehab  Date  10/30/16  Educator  C. Enterkin R.N.  Instruction Review Code  2- meets goals/outcomes      Diabetes: - Individual verbal and written instruction to review signs/symptoms of diabetes, desired ranges of glucose level fasting, after meals and with exercise. Advice that pre and post exercise glucose checks will be done for 3 sessions at entry of program.   Chronic Lung Diseases: - Group verbal and written instruction to review new updates, new respiratory medications, new advancements in procedures and treatments. Provide informative websites and "800" numbers of self-education. Flowsheet Row Pulmonary Rehab from 12/27/2016 in [facility] Cardiac and Pulmonary Rehab  Date  11/15/16  Educator  LB  Instruction Review Code  2- meets goals/outcomes      Lung Procedures: - Group verbal and written instruction to describe testing methods done to diagnose lung disease. Review the outcome of test results. Describe the treatment choices: Pulmonary Function Tests, ABGs and oximetry.   Energy Conservation: - Provide group verbal and  written instruction for methods to conserve energy, plan and organize activities. Instruct on pacing techniques, use of adaptive equipment and posture/positioning to relieve shortness of breath.   Triggers: - Group verbal and written instruction to review types of environmental controls: home humidity, furnaces, filters, dust mite/pet prevention, HEPA vacuums. To discuss weather changes, air quality and the benefits of nasal washing.   Exacerbations: - Group verbal and written instruction to provide: warning signs, infection symptoms, calling MD promptly, preventive modes, and value of vaccinations. Review: effective airway clearance, coughing and/or vibration techniques. Create an Sports administrator. Flowsheet Row Pulmonary Rehab from 12/27/2016 in [facility] Cardiac and Pulmonary Rehab  Date  11/22/16  Educator  LB  Instruction Review Code  2- meets goals/outcomes      Oxygen: - Individual and group verbal and written instruction on oxygen therapy. Includes supplement oxygen, available portable oxygen systems, continuous and intermittent flow rates, oxygen safety, concentrators, and Medicare reimbursement for oxygen.   Respiratory Medications: - Group verbal and written instruction to review medications for lung disease. Drug class, frequency, complications, importance of spacers, rinsing mouth after steroid MDI's, and proper cleaning methods for nebulizers.   AED/CPR: - Group verbal and written instruction with the use of models to demonstrate the basic use of the AED with the basic ABC's of resuscitation.   Breathing Retraining: - Provides individuals verbal and written instruction on purpose, frequency, and proper technique of diaphragmatic breathing and pursed-lipped breathing. Applies individual practice skills. Flowsheet Row Pulmonary Rehab from 12/27/2016 in [facility] Cardiac and Pulmonary Rehab  Date  10/30/16  Educator  C. ENterkinRN  Instruction Review Code  2- meets goals/outcomes       Anatomy and Physiology of the Lungs: - Group verbal and written instruction with the use of models to provide basic lung anatomy and physiology related to function, structure and complications of lung disease.   Heart Failure: - Group verbal and written instruction on the basics of heart failure: signs/symptoms, treatments, explanation of ejection fraction, enlarged heart and cardiomyopathy. Flowsheet Row Pulmonary Rehab from 12/27/2016 in [facility] Cardiac and Pulmonary Rehab  Date  12/01/16  Educator  SB  Instruction Review Code  2- meets goals/outcomes      Sleep Apnea: - Individual verbal and written instruction to review Obstructive Sleep Apnea. Review of risk factors, methods for diagnosing and types of masks and machines for OSA.   Anxiety: - Provides group, verbal and written instruction on the correlation between heart/lung disease and anxiety, treatment options, and management of anxiety. Flowsheet Row Pulmonary Rehab from 12/27/2016 in [facility] Cardiac and Pulmonary Rehab  Date  12/27/16  Educator  [facility]  Instruction Review Code  2- Meets goals/outcomes      Relaxation: - Provides group, verbal and written instruction about the benefits of relaxation for patients with heart/lung disease. Also provides patients with examples of relaxation techniques.   Knowledge Questionnaire Score:     Knowledge Questionnaire Score - 10/24/16 1211      Knowledge Questionnaire Score   Pre Score 10/10       Core Components/Risk Factors/Patient Goals at Admission:     Personal Goals and Risk Factors at Admission - 10/24/16 1207      Core Components/Risk Factors/Patient Goals on Admission    Weight Management Yes;Weight Maintenance   Admit Weight 207 lb 11.2 oz (94.2 kg)   Goal Weight: Short Term 204 lb (92.5 kg)   Goal Weight: Long Term 180 lb (81.6 kg)   Expected Outcomes Short Term: Continue to assess and modify interventions until short term weight is achieved;Long Term: Adherence  to nutrition and physical activity/exercise program aimed toward attainment of established weight goal;Weight Loss: Understanding of general recommendations for a balanced deficit meal plan, which promotes 1-2 lb weight loss per week and includes a negative energy balance of [id number] kcal/d   Sedentary Yes   Intervention Provide advice, education, support and counseling about physical activity/exercise needs.;Develop an individualized exercise prescription for aerobic and resistive training based on initial evaluation findings, risk stratification, comorbidities and participant's personal goals.   Expected Outcomes Achievement of increased cardiorespiratory fitness and enhanced flexibility, muscular endurance and strength shown through measurements of functional capacity and personal statement of participant.   Increase Strength and Stamina Yes   Intervention Provide advice, education, support and counseling about physical activity/exercise needs.;Develop an individualized exercise prescription for aerobic and resistive training based on initial evaluation findings, risk stratification, comorbidities and participant's personal goals.   Expected Outcomes Achievement of increased cardiorespiratory fitness and enhanced flexibility, muscular endurance and strength shown through measurements of functional capacity and personal statement of participant.   Improve shortness of breath with ADL's Yes   Intervention Provide education, individualized exercise plan and daily activity instruction to help decrease symptoms of SOB with activities of daily living.   Expected Outcomes Short Term: Achieves a reduction of symptoms when performing activities of daily living.   Develop more efficient breathing techniques such as purse lipped breathing and diaphragmatic breathing; and practicing self-pacing with activity Yes   Intervention Provide education, demonstration and support about specific breathing techniuqes  utilized for more efficient  breathing. Include techniques such as pursed lipped breathing, diaphragmatic breathing and self-pacing activity.   Expected Outcomes Short Term: Participant will be able to demonstrate and use breathing techniques as needed throughout daily activities.   Increase knowledge of respiratory medications and ability to use respiratory devices properly  Yes   Intervention Provide education and demonstration as needed of appropriate use of medications, inhalers, and oxygen therapy.   Expected Outcomes Short Term: Achieves understanding of medications use. Understands that oxygen is a medication prescribed by physician. Demonstrates appropriate use of inhaler and oxygen therapy.   Hypertension Yes   Intervention Provide education on lifestyle modifcations including regular physical activity/exercise, weight management, moderate sodium restriction and increased consumption of fresh fruit, vegetables, and low fat dairy, alcohol moderation, and smoking cessation.;Monitor prescription use compliance.   Expected Outcomes Short Term: Continued assessment and intervention until BP is < 140/20m HG in hypertensive participants. < 130/86mHG in hypertensive participants with diabetes, heart failure or chronic kidney disease.;Long Term: Maintenance of blood pressure at goal levels.   Lipids Yes   Intervention Provide education and support for participant on nutrition & aerobic/resistive exercise along with prescribed medications to achieve LDL <7073mHDL >64m56m Expected Outcomes Short Term: Participant states understanding of desired cholesterol values and is compliant with medications prescribed. Participant is following exercise prescription and nutrition guidelines.;Long Term: Cholesterol controlled with medications as prescribed, with individualized exercise RX and with personalized nutrition plan. Value goals: LDL < 70mg66mL > 40 mg.      Core Components/Risk Factors/Patient Goals  Review:      Goals and Risk Factor Review    Row Name 10/30/16 1254 11/22/16 1507 12/20/16 1249 12/22/16 1139       Core Components/Risk Factors/Patient Goals Review   Personal Goals Review Develop more efficient breathing techniques such as purse lipped breathing and diaphragmatic breathing and practicing self-pacing with activity. Weight Management/Obesity;Sedentary;Increase Strength and Stamina;Improve shortness of breath with ADL's;Develop more efficient breathing techniques such as purse lipped breathing and diaphragmatic breathing and practicing self-pacing with activity.;Increase knowledge of respiratory medications and ability to use respiratory devices properly.;Hypertension;Lipids Weight Management/Obesity;Increase knowledge of respiratory medications and ability to use respiratory devices properly.;Increase Strength and Stamina;Sedentary;Lipids;Hypertension  --    Review Cont to encourage pursed lip breathing Mr BrodeEschmannincreased his exercise goals, notices improvement in his shortness of breath, and is using PLB daily with his activity in [facility] and at home. He has good understanding of his Spiriva and Symbicort. A spacer was given to him today for use with his Symbicort and was given instruction on technique and cleaning of the spacer. His blood pressures are acceptable, and he is compliant with his medication, both blood pressure and cholestrol. Planning to meet with the dietitian, Mr BrodeEdgellthe food diary to complete.  Mr. BrodeKleinmanially saw a decrease in his shortness of breath with the program, but states that he is currently having a slight set-back related to "junk in my throat" making him cough more and increasing SOB.  He continues to use his pulmonary and blood pressure & cholesterol meds as prescribed (using spacer with inhalers) and is using his rescue inhaler on average one time/day and prior to coming to [facility] [employer] has noticed an improvement in overall stamina, but  steps are still difficult.  Bernardino Lavell bit reluctant to meet with dietician, stating "it would be a waste of time because I won't follow through with a diet".  I encouraged him  to make the appointment anyway, and that he might be surprised how much difference a few changes can make.  He agreed to make the appointment and he still has the diet diary to complete.  He continues to have excellent attendence to the program.   Mr. Bathgate initially saw a decrease in his shortness of breath with the program, but states that he is currently having a slight set-back related to "junk in my throat" making him cough more and increasing SOB.  He continues to use his pulmonary and blood pressure & cholesterol meds as prescribed (using spacer with inhalers) and is using his rescue inhaler on average one time/day and prior to coming to [employer].  He has noticed an improvement in overall stamina, but steps are still difficult.  Abhimanyu is a bit reluctant to meet with dietician, stating "it would be a waste of time because I won't follow through with a diet".  I encouraged him to make the appointment anyway, and that he might be surprised how much difference a few changes can make.  He agreed to make the appointment and he still has the diet diary to complete.  He continues to have excellent attendence to the program.      Expected Outcomes Able to do activities of daily living without shortness of breath Continue increasing his exercise goals and increasing knowledge of COPD management.  Continue increasing his exercise goals and increase knowledge of COPD management and diet management.    --       Core Components/Risk Factors/Patient Goals at Discharge (Final Review):      Goals and Risk Factor Review - 12/22/16 1139      Core Components/Risk Factors/Patient Goals Review   Review Mr. Conran initially saw a decrease in his shortness of breath with the program, but states that he is currently having a slight set-back related  to "junk in my throat" making him cough more and increasing SOB.  He continues to use his pulmonary and blood pressure & cholesterol meds as prescribed (using spacer with inhalers) and is using his rescue inhaler on average one time/day and prior to coming to [employer].  He has noticed an improvement in overall stamina, but steps are still difficult.  Rondey is a bit reluctant to meet with dietician, stating "it would be a waste of time because I won't follow through with a diet".  I encouraged him to make the appointment anyway, and that he might be surprised how much difference a few changes can make.  He agreed to make the appointment and he still has the diet diary to complete.  He continues to have excellent attendence to the program.        ITP Comments:     ITP Comments    Row Name 10/24/16 1155 10/30/16 1256 11/17/16 1341 12/25/16 1019 12/25/16 1026   ITP Comments Initial medical review completed with ITP created. Documentation of diagnosis in [facility] Office Encounter 09/28/2016 First full day of exercise!  Patient was oriented to gym and equipment including functions, settings, policies, and procedures.  Patient's individual exercise prescription and treatment plan were reviewed.  All starting workloads were established based on the results of the 6 minute walk test done at initial orientation visit.  The plan for exercise progression was also introduced and progression will be customized based on patient's performance and goals. Attended Know Your Numbers education class. Roosevelt [place]ocks called to say he was sorry that he could not attend Pulm Rehab  today because he is sick. There is also a chance of an ice storm today too.  Alistar Mcenery called to say he was sorry that he could not attend Pulm Rehab today because he is sick. There is also a chance of an ice storm today.       Comments: 30 day note review

## 2017-01-01 NOTE — Progress Notes (Signed)
Daily Session Note  Patient Details  Name: James [name] Sr. MRN: [id number] Date of Birth: 02/23/1929 Referring Provider:   Flowsheet Row Pulmonary Rehab from 10/24/2016 in [facility] Cardiac and Pulmonary Rehab  Referring Provider  Ramachandran      Encounter Date: 01/01/2017  Check In:     Session Check In - 01/01/17 1208      Check-In   Location [facility]-Cardiac & Pulmonary Rehab   Staff Present Earlean Shawl, BS, ACSM CEP, Exercise Physiologist;Laureen Owens Shark, BS, RRT, Respiratory Dareen Piano, BA, ACSM CEP, Exercise Physiologist   Supervising physician immediately available to respond to emergencies [facility] immediately available ER MD   Physician(s) Marcelene [place] and Jimmye Norman   Medication changes reported     No   Fall or balance concerns reported    No   Warm-up and Cool-down Performed as group-led instruction   Resistance Training Performed Yes   VAD Patient? No     Pain Assessment   Currently in Pain? No/denies   Multiple Pain Sites No         Goals Met:  Proper associated with RPD/PD & O2 Sat Independence with exercise equipment Exercise tolerated well Strength training completed today  Goals Unmet:  Not Applicable  Comments: Pt able to follow exercise prescription today without complaint.  Will continue to monitor for progression.    Dr. Emily Filbert is Medical Director for [place] and [facility].

## 2017-01-07 ENCOUNTER — Other Ambulatory Visit: Payer: Self-pay | Admitting: Family Medicine

## 2017-01-07 DIAGNOSIS — M5090 Cervical disc disorder, unspecified, unspecified cervical region: Secondary | ICD-10-CM

## 2017-01-08 DIAGNOSIS — R05 Cough: Secondary | ICD-10-CM | POA: Diagnosis not present

## 2017-01-08 DIAGNOSIS — J449 Chronic obstructive pulmonary disease, unspecified: Secondary | ICD-10-CM

## 2017-01-08 NOTE — Progress Notes (Signed)
Daily Session Note  Patient Details  Name: ODEAN FESTER Sr. MRN: [id number] Date of Birth: 1929/05/19 Referring Provider:   Flowsheet Row Pulmonary Rehab from 10/24/2016 in [facility] Cardiac and Pulmonary Rehab  Referring Provider  Ramachandran      Encounter Date: 01/08/2017  Check In:     Session Check In - 01/08/17 1304      Check-In   Location [facility]-Cardiac & Pulmonary Rehab   Staff Present Earlean Shawl, BS, ACSM CEP, Exercise Physiologist;Laureen Owens Shark, BS, RRT, Respiratory Dareen Piano, BA, ACSM CEP, Exercise Physiologist   Supervising physician immediately available to respond to emergencies [facility] immediately available ER MD   Physician(s) Mariea Clonts and Marcelene [place]   Medication changes reported     No   Fall or balance concerns reported    No   Warm-up and Cool-down Performed as group-led instruction   Resistance Training Performed Yes   VAD Patient? No     Pain Assessment   Currently in Pain? No/denies   Multiple Pain Sites No         Goals Met:  Proper associated with RPD/PD & O2 Sat Independence with exercise equipment Exercise tolerated well Strength training completed today  Goals Unmet:  Not Applicable  Comments: Pt able to follow exercise prescription today without complaint.  Will continue to monitor for progression.    Dr. Emily Filbert is Medical Director for [place] and [facility].

## 2017-01-09 ENCOUNTER — Telehealth: Payer: Self-pay

## 2017-01-09 NOTE — Telephone Encounter (Signed)
Please advise 

## 2017-01-09 NOTE — Telephone Encounter (Signed)
Pt reports he was referred to Dr. Arnoldo Morale in [place] on 01/23/2017. Pt is asking if he can be referred to a neurologist in [place]? Renaldo Fiddler, CMA

## 2017-01-10 ENCOUNTER — Encounter: Payer: Medicare Other | Admitting: Respiratory Therapy

## 2017-01-10 DIAGNOSIS — J449 Chronic obstructive pulmonary disease, unspecified: Secondary | ICD-10-CM

## 2017-01-10 DIAGNOSIS — R05 Cough: Secondary | ICD-10-CM | POA: Diagnosis not present

## 2017-01-10 NOTE — Progress Notes (Signed)
Daily Session Note  Patient Details  Name: James GALLICCHIO Sr. MRN: [id number] Date of Birth: 1929/06/14 Referring Provider:   Flowsheet Row Pulmonary Rehab from 10/24/2016 in [facility] Cardiac and Pulmonary Rehab  Referring Provider  Ramachandran      Encounter Date: 01/10/2017  Check In:     Session Check In - 01/10/17 1134      Check-In   Location [facility]-Cardiac & Pulmonary Rehab   Staff Present Carson Myrtle, BS, RRT, Respiratory Lennie Hummer, MA, ACSM RCEP, Exercise Physiologist;Kalla Watson RN BSN   Supervising physician immediately available to respond to emergencies [facility] immediately available ER MD   Physician(s) Drs. Malinda and Lord   Medication changes reported     No   Fall or balance concerns reported    No   Warm-up and Cool-down Performed as group-led Location manager Performed Yes   VAD Patient? No     Pain Assessment   Currently in Pain? No/denies   Multiple Pain Sites No         Goals Met:  Proper associated with RPD/PD & O2 Sat Using PLB without cueing & demonstrates good technique Exercise tolerated well No report of cardiac concerns or symptoms Strength training completed today  Goals Unmet:  Not Applicable  Comments: Pt able to follow exercise prescription today without complaint.  Will continue to monitor for progression.    Dr. Emily Filbert is Medical Director for [place] and [facility].

## 2017-01-10 NOTE — Telephone Encounter (Signed)
It will be a few weeks or more before before anyone can see him in [place], I would suggest he keep appointment with Dr. Arnoldo Morale in [place].

## 2017-01-11 NOTE — Telephone Encounter (Signed)
Patient advised as directed below.  Thanks,  -Tomeka Kantner 

## 2017-01-12 ENCOUNTER — Encounter: Payer: Medicare Other | Admitting: *Deleted

## 2017-01-12 DIAGNOSIS — R05 Cough: Secondary | ICD-10-CM | POA: Diagnosis not present

## 2017-01-12 DIAGNOSIS — J449 Chronic obstructive pulmonary disease, unspecified: Secondary | ICD-10-CM

## 2017-01-12 NOTE — Progress Notes (Addendum)
Daily Session Note  Patient Details  Name: James HYSER Sr. MRN: [id number] Date of Birth: 1929-12-06 Referring Provider:   Flowsheet Row Pulmonary Rehab from 10/24/2016 in [facility] Cardiac and Pulmonary Rehab  Referring Provider  Ramachandran      Encounter Date: 01/12/2017  Check In:     Session Check In - 01/12/17 1204      Check-In   Location [facility]-Cardiac & Pulmonary Rehab   Staff Present Gerlene Burdock, RN, Levie Heritage, MA, ACSM RCEP, Exercise Physiologist;Patricia Surles RN BSN   Supervising physician immediately available to respond to emergencies See telemetry face sheet for immediately available ER MD   Physician(s) Dr. Kerman Passey and Dr. Quentin Cornwall   Medication changes reported     No   Fall or balance concerns reported    No   Warm-up and Cool-down Performed on first and last piece of equipment   Resistance Training Performed Yes   VAD Patient? No     Pain Assessment   Currently in Pain? No/denies         Goals Met:  Proper associated with RPD/PD & O2 Sat Independence with exercise equipment Using PLB without cueing & demonstrates good technique Exercise tolerated well Personal goals reviewed Strength training completed today  Goals Unmet:  Not Applicable  Comments: Pt able to follow exercise prescription today without complaint.  Will continue to monitor for progression.     Dr. Emily Filbert is Medical Director for [place] and [facility].

## 2017-01-15 DIAGNOSIS — R05 Cough: Secondary | ICD-10-CM | POA: Diagnosis not present

## 2017-01-15 DIAGNOSIS — J449 Chronic obstructive pulmonary disease, unspecified: Secondary | ICD-10-CM

## 2017-01-15 NOTE — Progress Notes (Signed)
Daily Session Note  Patient Details  Name: James MCCAUGHAN Sr. MRN: [id number] Date of Birth: August 17, 1929 Referring Provider:   Flowsheet Row Pulmonary Rehab from 10/24/2016 in [facility] Cardiac and Pulmonary Rehab  Referring Provider  Ramachandran      Encounter Date: 01/15/2017  Check In:     Session Check In - 01/15/17 1235      Check-In   Location [facility]-Cardiac & Pulmonary Rehab   Staff Present Carson Myrtle, BS, RRT, Respiratory Therapist;Kelly Amedeo Plenty, BS, ACSM CEP, Exercise Physiologist;Amanda Oletta Darter, BA, ACSM CEP, Exercise Physiologist   Supervising physician immediately available to respond to emergencies [facility] immediately available ER MD   Physician(s) Corky Downs and Paduchowski   Medication changes reported     No   Fall or balance concerns reported    No   Warm-up and Cool-down Performed as group-led instruction   Resistance Training Performed Yes   VAD Patient? No     Pain Assessment   Currently in Pain? No/denies   Multiple Pain Sites No         Goals Met:  Proper associated with RPD/PD & O2 Sat Independence with exercise equipment Exercise tolerated well Strength training completed today  Goals Unmet:  Not Applicable  Comments: Pt able to follow exercise prescription today without complaint.  Will continue to monitor for progression.    Dr. Emily Filbert is Medical Director for [place] and [facility].

## 2017-01-16 ENCOUNTER — Other Ambulatory Visit: Payer: Self-pay | Admitting: Family Medicine

## 2017-01-16 DIAGNOSIS — Z6833 Body mass index (BMI) 33.0-33.9, adult: Secondary | ICD-10-CM | POA: Diagnosis not present

## 2017-01-16 DIAGNOSIS — I10 Essential (primary) hypertension: Secondary | ICD-10-CM | POA: Diagnosis not present

## 2017-01-16 DIAGNOSIS — R0602 Shortness of breath: Secondary | ICD-10-CM

## 2017-01-16 DIAGNOSIS — M542 Cervicalgia: Secondary | ICD-10-CM | POA: Diagnosis not present

## 2017-01-16 DIAGNOSIS — M4712 Other spondylosis with myelopathy, cervical region: Secondary | ICD-10-CM | POA: Diagnosis not present

## 2017-01-17 ENCOUNTER — Encounter: Payer: Medicare Other | Admitting: *Deleted

## 2017-01-17 DIAGNOSIS — J449 Chronic obstructive pulmonary disease, unspecified: Secondary | ICD-10-CM

## 2017-01-17 DIAGNOSIS — R05 Cough: Secondary | ICD-10-CM | POA: Diagnosis not present

## 2017-01-17 NOTE — Progress Notes (Signed)
Daily Session Note  Patient Details  Name: James NUNN Sr. MRN: [id number] Date of Birth: 1929/09/09 Referring Provider:   Flowsheet Row Pulmonary Rehab from 10/24/2016 in [facility] Cardiac and Pulmonary Rehab  Referring Provider  Ramachandran      Encounter Date: 01/17/2017  Check In:     Session Check In - 01/17/17 1223      Check-In   Location [facility]-Cardiac & Pulmonary Rehab   Staff Present Carson Myrtle, BS, RRT, Respiratory Lennie Hummer, MA, ACSM RCEP, Exercise Physiologist;Temiloluwa Laredo RN BSN   Supervising physician immediately available to respond to emergencies [facility] immediately available ER MD   Physician(s) Drs. Malinda and Williams   Medication changes reported     No   Fall or balance concerns reported    No   Warm-up and Cool-down Performed as group-led Location manager Performed Yes   VAD Patient? No     Pain Assessment   Currently in Pain? No/denies   Multiple Pain Sites No         Goals Met:  Proper associated with RPD/PD & O2 Sat Independence with exercise equipment Using PLB without cueing & demonstrates good technique Exercise tolerated well Strength training completed today  Goals Unmet:  Not Applicable  Comments: Pt able to follow exercise prescription today without complaint.  Will continue to monitor for progression.   Dr. Emily Filbert is Medical Director for [place] and [facility].

## 2017-01-19 ENCOUNTER — Telehealth: Payer: Self-pay | Admitting: *Deleted

## 2017-01-19 NOTE — Telephone Encounter (Signed)
James [name] called out for rehab today as his hip was bothering him more today

## 2017-01-22 ENCOUNTER — Encounter: Payer: Medicare Other | Attending: Internal Medicine

## 2017-01-22 DIAGNOSIS — R0609 Other forms of dyspnea: Secondary | ICD-10-CM | POA: Insufficient documentation

## 2017-01-22 DIAGNOSIS — R05 Cough: Secondary | ICD-10-CM | POA: Insufficient documentation

## 2017-01-23 ENCOUNTER — Other Ambulatory Visit: Payer: Self-pay | Admitting: Neurosurgery

## 2017-01-24 ENCOUNTER — Encounter: Payer: Medicare Other | Admitting: *Deleted

## 2017-01-24 DIAGNOSIS — J449 Chronic obstructive pulmonary disease, unspecified: Secondary | ICD-10-CM

## 2017-01-24 DIAGNOSIS — R05 Cough: Secondary | ICD-10-CM | POA: Diagnosis not present

## 2017-01-24 DIAGNOSIS — R0609 Other forms of dyspnea: Secondary | ICD-10-CM | POA: Diagnosis not present

## 2017-01-24 NOTE — Progress Notes (Signed)
Daily Session Note  Patient Details  Name: James [name] Sr. MRN: [id number] Date of Birth: June 06, 1929 Referring Provider:   Flowsheet Row Pulmonary Rehab from 10/24/2016 in [facility] Cardiac and Pul[facility] Rehab  Referring Provider  Ramachandran      Encounter Date: 01/24/2017  Check In:     Session Check In - 01/24/17 1140      Check-In   Location [facility]-Cardiac & Pulmonary Rehab   Staff Present Alberteen Sam, MA, ACSM RCEP, Exercise Physiologist;Patricia Surles RN BSN;Laureen Owens Shark, BS, RRT, Respiratory Therapist   Supervising physician immediately available to respond to emergencies [facility] immediately available ER MD   Physician(s) Drs. Malinda and Robinson   Medication changes reported     No   Fall or balance concerns reported    No   Warm-up and Cool-down Performed as group-led Location manager Performed Yes   VAD Patient? No     Pain Assessment   Currently in Pain? No/denies   Multiple Pain Sites No         Goals Met:  Proper associated with RPD/PD & O2 Sat Independence with exercise equipment Using PLB without cueing & demonstrates good technique Exercise tolerated well Strength training completed today  Goals Unmet:  Not Applicable  Comments: Pt able to follow exercise prescription today without complaint.  Will continue to monitor for progression.    Dr. Emily Filbert is Medical Director for [place] and [facility].

## 2017-01-26 ENCOUNTER — Encounter: Payer: Medicare Other | Admitting: *Deleted

## 2017-01-26 DIAGNOSIS — R0609 Other forms of dyspnea: Secondary | ICD-10-CM | POA: Diagnosis not present

## 2017-01-26 DIAGNOSIS — J449 Chronic obstructive pulmonary disease, unspecified: Secondary | ICD-10-CM

## 2017-01-26 DIAGNOSIS — R05 Cough: Secondary | ICD-10-CM | POA: Diagnosis not present

## 2017-01-26 NOTE — Progress Notes (Signed)
Daily Session Note  Patient Details  Name: James [name] Sr. MRN: [id number] Date of Birth: 1929/01/21 Referring Provider:   Flowsheet Row Pulmonary Rehab from 10/24/2016 in [facility] Cardiac and Pulmonary Rehab  Referring Provider  Ramachandran      Encounter Date: 01/26/2017  Check In:     Session Check In - 01/26/17 1212      Check-In   Location [facility]-Cardiac & Pulmonary Rehab   Staff Present Alberteen Sam, MA, ACSM RCEP, Exercise Physiologist;Abdirizak Richison RN BSN   Supervising physician immediately available to respond to emergencies [facility] immediately available ER MD   Physician(s) Drs. Schaevitz & Williams   Medication changes reported     No   Fall or balance concerns reported    No   Warm-up and Cool-down Performed as group-led Location manager Performed Yes   VAD Patient? No     Pain Assessment   Currently in Pain? No/denies   Multiple Pain Sites No         Goals Met:  Proper associated with RPD/PD & O2 Sat Independence with exercise equipment Using PLB without cueing & demonstrates good technique Exercise tolerated well No report of cardiac concerns or symptoms Strength training completed today  Goals Unmet:  Not Applicable  Comments: Pt able to follow exercise prescription today without complaint.  Will continue to monitor for progression.    Dr. Emily Filbert is Medical Director for [place] and [facility].

## 2017-01-29 ENCOUNTER — Encounter: Payer: Self-pay | Admitting: Respiratory Therapy

## 2017-01-29 DIAGNOSIS — R05 Cough: Secondary | ICD-10-CM | POA: Diagnosis not present

## 2017-01-29 DIAGNOSIS — R0609 Other forms of dyspnea: Secondary | ICD-10-CM | POA: Diagnosis not present

## 2017-01-29 DIAGNOSIS — J449 Chronic obstructive pulmonary disease, unspecified: Secondary | ICD-10-CM

## 2017-01-29 NOTE — Progress Notes (Signed)
Daily Session Note  Patient Details  Name: James WOLZ Sr. MRN: [id number] Date of Birth: [birth year] Referring Provider:   Flowsheet Row Pulmonary Rehab from 10/24/2016 in [facility] Card[facility] Pul[facility] Re[facility]  Referring Provider  Ramachandran      Encounter Date: 01/29/2017  Check In:     Session Check In - 01/29/17 1344      Check-In   Location [facility]-Cardiac & Pulmonary Rehab   Staff Present Nyoka Cowden, RN, BSN, Kela Millin, BA, ACSM CEP, Exercise Physiologist;Laureen Janell Quiet, RRT, Respiratory Therapist   Supervising physician immediately available to respond to emergencies [facility] immediately available ER MD   Physician(s) Jacqualine Code and Mariea Clonts   Medication changes reported     No   Fall or balance concerns reported    No   Warm-up and Cool-down Performed as group-led instruction   Resistance Training Performed Yes   VAD Patient? No     Pain Assessment   Currently in Pain? No/denies   Multiple Pain Sites No         Goals Met:  Proper associated with RPD/PD & O2 Sat Independence with exercise equipment Exercise tolerated well Strength training completed today  Goals Unmet:  Not Applicable  Comments: Pt able to follow exercise prescription today without complaint.  Will continue to monitor for progression.    Dr. Emily [name] is Medical Director for [place] and [facility].

## 2017-01-29 NOTE — Progress Notes (Signed)
Pulmonary Individual Treatment Plan  Patient Details  Name: James RICKE Sr. MRN: [id number] Date of Birth: [birth year] Referring Provider:   Flowsheet Row Pulmonary Rehab from 10/24/2016 in [facility] Cardiac and Pulmonary Rehab  Referring Provider  Ramachandran      Initial Encounter Date:  Flowsheet Row Pulmonary Rehab from 10/24/2016 in [facility] Cardiac and Pulmonary Rehab  Date  10/24/16  Referring Provider  Ashby Dawes      Visit Diagnosis: COPD, mild ([place])  Patient's Home Medications on Admission:  Current Outpatient Prescriptions:    albuterol (PROAIR HFA) 108 (90 Base) MCG/ACT inhaler, Inhale 2 puffs into the lungs every 6 (six) hours as needed for wheezing or shortness of breath., Disp: 1 Inhaler, Rfl: 2   aspirin EC 325 MG tablet, Take by mouth., Disp: , Rfl:    atorvastatin (LIPITOR) 80 MG tablet, Take 1 tablet (80 mg total) by mouth daily., Disp: 90 tablet, Rfl: 4   azelastine (OPTIVAR) 0.05 % ophthalmic solution, Apply 1 drop to eye 2 (two) times daily., Disp: 6 mL, Rfl: 5   benazepril-hydrochlorthiazide (LOTENSIN HCT) 20-12.5 MG tablet, Take 1 tablet by mouth daily., Disp: 90 tablet, Rfl: 3   beta carotene w/minerals (OCUVITE) tablet, Take 1 tablet by mouth daily., Disp: , Rfl:    budesonide-formoterol (SYMBICORT) 160-4.5 MCG/ACT inhaler, Inhale 2 puffs into the lungs 2 (two) times daily., Disp: 1 Inhaler, Rfl: 12   fluticasone (FLONASE) 50 MCG/ACT nasal spray, Place 2 sprays into both nostrils daily., Disp: 48 g, Rfl: 5   furosemide (LASIX) 20 MG tablet, Take 1 tablet (20 mg total) by mouth daily., Disp: 30 tablet, Rfl: 0   HYDROcodone-acetaminophen (NORCO/VICODIN) 5-325 MG tablet, Take 1 tablet by mouth 2 (two) times daily as needed., Disp: 30 tablet, Rfl: 0   lansoprazole (PREVACID) 30 MG capsule, TAKE 1 CAPSULE EVERY DAY, Disp: 30 capsule, Rfl: 6   LORazepam (ATIVAN) 1 MG tablet, TAKE ONE TABLET BY MOUTH AT BEDTIME AS NEEDED FOR ANXIETY, Disp: 30 tablet, Rfl: 4    Magnesium 400 MG CAPS, Take 1 capsule by mouth daily. , Disp: , Rfl:    metoprolol (LOPRESSOR) 50 MG tablet, Take 1 tablet (50 mg total) by mouth 2 (two) times daily., Disp: 180 tablet, Rfl: 4   montelukast (SINGULAIR) 10 MG tablet, TAKE 1 TABLET BY MOUTH DAILY, Disp: 30 tablet, Rfl: 12   MULTIPLE VITAMIN PO, Take 1 tablet by mouth daily. , Disp: , Rfl:    OMEGA-3 FATTY ACIDS PO, Take 1,000 mg by mouth daily. , Disp: , Rfl:    predniSONE (STERAPRED UNI-PAK 48 TAB) 10 MG (48) TBPK tablet, As directed, Disp: 48 tablet, Rfl: 0   SPIRIVA HANDIHALER 18 MCG inhalation capsule, INHALE 1 CAPSULE AS DIRECTED ONCE A DAY, Disp: 30 capsule, Rfl: 12   tamsulosin (FLOMAX) 0.4 MG CAPS capsule, Take 1 capsule (0.4 mg total) by mouth daily., Disp: 90 capsule, Rfl: 4   tiZANidine (ZANAFLEX) 4 MG tablet, TAKE ONE TABLET AT BEDTIME, Disp: 30 tablet, Rfl: 12   traMADol (ULTRAM) 50 MG tablet, Take 50 mg by mouth 2 (two) times daily. , Disp: , Rfl:   Past Medical History: Past Medical History:  Diagnosis Date   Back pain    CAD (coronary artery disease)    Diverticulosis    GERD (gastroesophageal reflux disease)    History of chicken pox    History of measles    History of mumps    Hyperglycemia    Hyperlipidemia    Hypertension  Hypotension    Prostate cancer (HCC)    TIA (transient ischemic attack)     Tobacco Use: History  Smoking Status   Former Smoker   Packs/day: 1.00   Years: 11.00   Types: Cigarettes   Quit date: 12/18/1978  Smokeless Tobacco   Never Used    Labs: Recent Review Flowsheet Data    Labs for ITP Cardiac and Pulmonary Rehab Latest Ref Rng & Units 10/05/2014 10/07/2014   Cholestrol 0 - 200 mg/dL - 104   LDLCALC mg/dL - 40   HDL 35 - 70 mg/dL - 52   Trlycerides 40 - 160 mg/dL - 61   Hemoglobin A1c 4.0 - 6.0 % 5.7 5.7       ADL UCSD:     Pulmonary Assessment Scores    Row Name 10/24/16 1213 12/27/16 1134       ADL UCSD   ADL Phase  Entry Mid    SOB Score total 29 64    Rest 1 1    Walk 2 3    Stairs 3 4    Bath 3 4    Dress 2 3    Shop 1 3       Pulmonary Function Assessment:     Pulmonary Function Assessment - 10/24/16 1212      Initial Spirometry Results   FVC% 37 %   FEV1% 47 %   FEV1/FVC Ratio 87.3     Breath   Bilateral Breath Sounds Clear   Shortness of Breath Yes;Limiting activity      Exercise Target Goals:    Exercise Program Goal: Individual exercise prescription set with THRR, safety & activity barriers. Participant demonstrates ability to understand and report RPE using BORG scale, to self-measure pulse accurately, and to acknowledge the importance of the exercise prescription.  Exercise Prescription Goal: Starting with aerobic activity 30 plus minutes a day, 3 days per week for initial exercise prescription. Provide home exercise prescription and guidelines that participant acknowledges understanding prior to discharge.  Activity Barriers & Risk Stratification:     Activity Barriers & Cardiac Risk Stratification - 10/24/16 1211      Activity Barriers & Cardiac Risk Stratification   Activity Barriers Back Problems;Arthritis;Shortness of Breath;Deconditioning      6 Minute Walk:     6 Minute Walk    Row Name 10/24/16 1258 12/20/16 1350       6 Minute Walk   Phase  -- Mid Program    Distance 800 feet 1000 feet    Distance % Change  -- 25 %  200 ft    Walk Time 6 minutes 6 minutes    # of Rest Breaks 0 0    MPH 1.5 1.89    METS 2.15 2.45    RPE 17 17    Perceived Dyspnea  4 5    VO2 Peak 1.9 4.18    Symptoms Yes (comment) Yes (comment)    Comments left hip pain more than breathing left hip pain    Resting HR 67 bpm 81 bpm    Resting BP 126/62 126/69    Max Ex. HR 110 bpm 102 bpm    Max Ex. BP 150/70 136/64    2 Minute Post BP  -- 134/64      Interval HR   Baseline HR 67 81    1 Minute HR 92 91    2 Minute HR 99 94    3 Minute HR 96 99  4 Minute HR 110 100     5 Minute HR 99 101    6 Minute HR 100 102    2 Minute Post HR 81 87    Interval Heart Rate? Yes Yes      Interval Oxygen   Interval Oxygen? Yes Yes    Baseline Oxygen Saturation % 96 % 95 %    Baseline Liters of Oxygen  -- 0 L  Room Air    1 Minute Oxygen Saturation % 92 % 96 %    1 Minute Liters of Oxygen  -- 0 L    2 Minute Oxygen Saturation % 92 % 94 %    2 Minute Liters of Oxygen  -- 0 L    3 Minute Oxygen Saturation % 91 % 94 %    3 Minute Liters of Oxygen  -- 0 L    4 Minute Oxygen Saturation % 93 % 92 %    4 Minute Liters of Oxygen  -- 0 L    5 Minute Oxygen Saturation % 91 % 93 %    5 Minute Liters of Oxygen  -- 0 L    6 Minute Oxygen Saturation % 93 % 93 %    6 Minute Liters of Oxygen  -- 0 L    2 Minute Post Oxygen Saturation % 96 % 97 %    2 Minute Post Liters of Oxygen  -- 0 L       Initial Exercise Prescription:     Initial Exercise Prescription - 10/24/16 1300      Date of Initial Exercise RX and Referring Provider   Date 10/24/16   Referring Provider Ramachandran     Treadmill   MPH 1.4   Grade 0   Minutes 15  5/5/5   METs 2.07     NuStep   Level 2   Minutes 15   METs 2     T5 Nustep   Level 1   Minutes 15   METs 2     Intensity   THRR 40-80% of Max Heartrate 94-121   Ratings of Perceived Exertion 11-13   Perceived Dyspnea 0-4     Progression   Progression Continue to progress workloads to maintain intensity without signs/symptoms of physical distress.     Resistance Training   Training Prescription Yes   Weight 2   Reps 10-12      Perform Capillary Blood Glucose checks as needed.  Exercise Prescription Changes:     Exercise Prescription Changes    Row Name 10/30/16 1200 11/07/16 1600 11/21/16 1500 11/24/16 1200 12/06/16 1400     Exercise Review   Progression  -- Yes Yes Yes Yes     Response to Exercise   Blood Pressure (Admit) 140/60 136/70 112/84  -- 124/72   Blood Pressure (Exercise) 132/68 110/72 118/74  -- 140/68    Blood Pressure (Exit)  -- 122/80 118/68  -- 98/58   Heart Rate (Admit) 67 bpm 70 bpm 72 bpm  -- 68 bpm   Heart Rate (Exercise) 75 bpm 92 bpm 101 bpm  -- 105 bpm   Heart Rate (Exit)  -- 82 bpm 61 bpm  -- 68 bpm   Oxygen Saturation (Admit) 97 % 92 % 97 %  -- 97 %   Oxygen Saturation (Exercise) 95 %  Room air 92 % 94 % 94 % 95 %   Oxygen Saturation (Exit)  -- 95 % 98 %  -- 96 %  Rating of Perceived Exertion (Exercise) '11 13 13  ' -- 13   Perceived Dyspnea (Exercise) '3 4 3  ' -- 3   Symptoms --  new knee pain which felt better with exercise.  -- none none none   Comments  --  --  -- Home Exercise Guidelines given 11/24/16 Home Exercise Guidelines given 11/24/16   Duration  -- Progress to 45 minutes of aerobic exercise without signs/symptoms of physical distress Progress to 45 minutes of aerobic exercise without signs/symptoms of physical distress Progress to 45 minutes of aerobic exercise without signs/symptoms of physical distress Progress to 45 minutes of aerobic exercise without signs/symptoms of physical distress   Intensity  -- THRR unchanged THRR unchanged THRR unchanged THRR unchanged     Progression   Progression  -- Continue to progress workloads to maintain intensity without signs/symptoms of physical distress. Continue to progress workloads to maintain intensity without signs/symptoms of physical distress. Continue to progress workloads to maintain intensity without signs/symptoms of physical distress. Continue to progress workloads to maintain intensity without signs/symptoms of physical distress.   Average METs  -- 1.99 '2 2 2     ' Resistance Training   Training Prescription Yes Yes Yes Yes Yes   Weight 2 2 lbs 2 lbs 2 lbs 2 lbs   Reps 10-12 10-12 10-12 10-12 10-12     Interval Training   Interval Training  -- No No No No     Treadmill   MPH 1.4 1 1.2 1.2 1.4   Grade 0 0 0 0 0   Minutes 15  Did alternate equipment due to knee pain will cont to assess '15 15 15 15   ' METs 2.07 1.77  1.92 1.92 2.07     NuStep   Level '2 2 2 2 3   ' Minutes '15 15 15 15 15   ' METs 2 2.2 1.9 1.9 2.4     REL-XR   Level  --  -- '1 1 2   ' Watts  --  -- --  speed 40-50 --  speed 40-50  --   Minutes  --  -- '15 15 15   ' METs  --  -- 2.2 2.2 1.5     T5 Nustep   Level 1  --  --  --  --   Minutes 15  --  --  --  --   METs 2  --  --  --  --     Home Exercise Plan   Plans to continue exercise at  --  --  -- Home  walking Home  walking   Frequency  --  --  -- Add 2 additional days to program exercise sessions. Add 2 additional days to program exercise sessions.   Row Name 12/20/16 1300 01/02/17 1500 01/17/17 1400         Exercise Review   Progression Yes Yes Yes       Response to Exercise   Blood Pressure (Admit) 126/69 134/70 126/72     Blood Pressure (Exercise) 136/64 144/70 132/70     Blood Pressure (Exit) 116/64 114/72 118/70     Heart Rate (Admit) 98 bpm 80 bpm 66 bpm     Heart Rate (Exercise) 95 bpm 104 bpm 84 bpm     Heart Rate (Exit) 76 bpm 93 bpm 70 bpm     Oxygen Saturation (Admit) 95 % 97 % 96 %     Oxygen Saturation (Exercise) 93 % 91 % 94 %  Oxygen Saturation (Exit) 96 % 97 % 97 %     Rating of Perceived Exertion (Exercise) '13 13 12     ' Perceived Dyspnea (Exercise) '4 3 3     ' Symptoms none hip pain hip pain and shoulder pain     Comments Home Exercise Guidelines given 11/24/16 Home Exercise Guidelines given 11/24/16 Home Exercise Guidelines given 11/24/16     Duration Progress to 45 minutes of aerobic exercise without signs/symptoms of physical distress Progress to 45 minutes of aerobic exercise without signs/symptoms of physical distress Progress to 45 minutes of aerobic exercise without signs/symptoms of physical distress     Intensity THRR unchanged THRR unchanged THRR unchanged       Progression   Progression Continue to progress workloads to maintain intensity without signs/symptoms of physical distress. Continue to progress workloads to maintain intensity without  signs/symptoms of physical distress. Continue to progress workloads to maintain intensity without signs/symptoms of physical distress.     Average METs 1.89 2.05 2.39       Resistance Training   Training Prescription Yes Yes Yes     Weight 2 lbs 3 lbs right and 2 lbs left red band     Reps 10-12 10-12 10-15       Interval Training   Interval Training No No No       Treadmill   MPH 1.4 1.3  modified for hip pain 1.4     Grade 0 0 0     Minutes '15 15 15     ' METs 2.07 2 2.07       NuStep   Level '4 5 5     ' Minutes '15 15 15     ' METs 2.3 2.2 2.4       REL-XR   Level 2  -- 2     Watts  --  -- --  44 spm     Minutes 15  -- 15     METs 1.5  -- 2.7       T5 Nustep   Level  -- 2  --     Minutes  -- 15  --     METs  -- 1.9  --       Home Exercise Plan   Plans to continue exercise at Home  walking Home  walking Home  walking     Frequency Add 2 additional days to program exercise sessions. Add 2 additional days to program exercise sessions. Add 2 additional days to program exercise sessions.        Exercise Comments:     Exercise Comments    Row Name 11/07/16 1620 11/21/16 1545 11/24/16 1209 11/24/16 1230 12/06/16 1453   Exercise Comments James [name] had a great first day on 11/13.  He is doing well.  We will continue to monitor his progression. James [name] continues to do well with exercise.  We will try to bump up some of his workloads.  He had bumped up the NuStep by accident, but did not return to the increase.  We will continue to monitor his progression. Reviewed METs average and discussed progression with pt today. Reviewed home exercise with pt today.  Pt plans to walk at home and at stores for exercise.  Reviewed THR, pulse, RPE, sign and symptoms, and when to call 911 or MD.  Also discussed weather considerations and indoor options.  Pt voiced understanding. James [name] has been doing well with rehab.  He is already almost half way done.  We  will be doing his mid 6MWT soon as he is on visit  15 today.  He has sped his treadmill up to 1.4 mph now.  We will continue to monitor James [name]'s progression.   Row Name 12/20/16 1352 12/27/16 1234 01/02/17 1502 01/17/17 1427     Exercise Comments Hairo has returned after being out sick for a week.  He did his mid 6MWT today and improved by 25%.  We will conitnue to monitor his porgression. Pt able to follow exercise prescription today without complaint at reduced workloads due to hip pain (chronic pain flare-up) that kept him out of class earlier this week as well.  Will continue to monitor for progression. Pradyun has been doing well in exercise. He has had some issues with his hip, but is trying to do his exercise.  We will continue to monitor his progression. [place] continues to do well in exercise.  He is now also having some left sided shoulder problems along with his hip.  We will continue to monitor his progress.       Discharge Exercise Prescription (Final Exercise Prescription Changes):     Exercise Prescription Changes - 01/17/17 1400      Exercise Review   Progression Yes     Response to Exercise   Blood Pressure (Admit) 126/72   Blood Pressure (Exercise) 132/70   Blood Pressure (Exit) 118/70   Heart Rate (Admit) 66 bpm   Heart Rate (Exercise) 84 bpm   Heart Rate (Exit) 70 bpm   Oxygen Saturation (Admit) 96 %   Oxygen Saturation (Exercise) 94 %   Oxygen Saturation (Exit) 97 %   Rating of Perceived Exertion (Exercise) 12   Perceived Dyspnea (Exercise) 3   Symptoms hip pain and shoulder pain   Comments Home Exercise Guidelines given 11/24/16   Duration Progress to 45 minutes of aerobic exercise without signs/symptoms of physical distress   Intensity THRR unchanged     Progression   Progression Continue to progress workloads to maintain intensity without signs/symptoms of physical distress.   Average METs 2.39     Resistance Training   Training Prescription Yes   Weight red band   Reps 10-15     Interval Training   Interval  Training No     Treadmill   MPH 1.4   Grade 0   Minutes 15   METs 2.07     NuStep   Level 5   Minutes 15   METs 2.4     REL-XR   Level 2   Watts --  44 spm   Minutes 15   METs 2.7     Home Exercise Plan   Plans to continue exercise at Home  walking   Frequency Add 2 additional days to program exercise sessions.       Nutrition:  Target Goals: Understanding of nutrition guidelines, daily intake of sodium <1565m, cholesterol <2082m calories 30% from fat and 7% or less from saturated fats, daily to have 5 or more servings of fruits and vegetables.  Biometrics:     Pre Biometrics - 10/24/16 1257      Pre Biometrics   Height '5\' 5"'  (1.651 m)   Weight 207 lb 11.2 oz (94.2 kg)   Waist Circumference 44.25 inches   Hip Circumference 44.25 inches   Waist to Hip Ratio 1 %   BMI (Calculated) 34.6       Nutrition Therapy Plan and Nutrition Goals:     Nutrition Therapy & Goals - 10/24/16  1207      Intervention Plan   Intervention Prescribe, educate and counsel regarding individualized specific dietary modifications aiming towards targeted core components such as weight, hypertension, lipid management, diabetes, heart failure and other comorbidities.   Expected Outcomes Short Term Goal: Understand basic principles of dietary content, such as calories, fat, sodium, cholesterol and nutrients.;Short Term Goal: A plan has been developed with personal nutrition goals set during dietitian appointment.;Long Term Goal: Adherence to prescribed nutrition plan.      Nutrition Discharge: Rate Your Plate Scores:     Nutrition Assessments - 10/30/16 1554      Rate Your Plate Scores   Pre Score 60   Pre Score % 66.7 %      Psychosocial: Target Goals: Acknowledge presence or absence of depression, maximize coping skills, provide positive support system. Participant is able to verbalize types and ability to use techniques and skills needed for reducing stress and  depression.  Initial Review & Psychosocial Screening:     Initial Psych Review & Screening - 10/24/16 1209      Initial Review   Current issues with --  No concerns noted     Family Dynamics   Good Support System? Yes  Wife and neighbors     Barriers   Psychosocial barriers to participate in program There are no identifiable barriers or psychosocial needs.;The patient should benefit from training in stress management and relaxation.     Screening Interventions   Interventions Encouraged to exercise      Quality of Life Scores:     Quality of Life - 10/24/16 1218      Quality of Life Scores   Health/Function Pre 19.81 %   Socioeconomic Pre 21 %   Psych/Spiritual Pre 21 %   Family Pre 21 %   GLOBAL Pre 20.46 %      PHQ-9: Recent Review Flowsheet Data    Depression screen [facility] 2/9 10/24/2016 12/03/2015   Decreased Interest 0 0   Down, Depressed, Hopeless 0 0   PHQ - 2 Score 0 0   Altered sleeping 1 -   Tired, decreased energy 2 -   Change in appetite 0 -   Feeling bad or failure about yourself  2  -   Trouble concentrating 1 -   Moving slowly or fidgety/restless 0 -   Suicidal thoughts 0 -   PHQ-9 Score 6 -   Difficult doing work/chores Somewhat difficult -      Psychosocial Evaluation and Intervention:     Psychosocial Evaluation - 11/06/16 1226      Psychosocial Evaluation & Interventions   Interventions Encouraged to exercise with the program and follow exercise prescription   Comments Counselor met with James [name] today for initial psychosocial evaluation.  He is a well-adjusted 83 year old who has been diagnosed with mild COPD.  He has a wife of 31 years and is actively involved in his local church.  He has had CABGx5 in 2011 and hip replacement in 1997.  He also is in remission from Prostate Cancer in 2014.  James [name] states he has "memory issues" as well., although he may have had some difficulty recalling information; it seemed more related to his lack of  ability to hear - as he forgot his hearing aid today.  James [name] states his sleep varies and his appetite is "too good."  He denies a history or current symptoms of depression or anxiety and states he is a positive person most of  the time.  James [name] has minimal stress in his life other than his health.  He reports his spouse walks (3) miles per day and he may join her once he completes this program.  His goals while here are to breathe better; increase his energy and possible lose some weight.        Psychosocial Re-Evaluation:     Psychosocial Re-Evaluation    [place] Name 11/22/16 1521 12/20/16 1242 12/25/16 1019 01/12/17 1309       Psychosocial Re-Evaluation   Interventions  -- Encouraged to attend Pulmonary Rehabilitation for the exercise  -- Encouraged to attend Pulmonary Rehabilitation for the exercise    Comments [facility] psychosocial assessment reveals no barriers at this time to participation in Pulmonary Rehab.  he  has good family and friend support that encourages Read to participate in [place] and progress with His goals.  [place] concerns are monitored, but he  has acknowledge that attending the program has helped to maintain quality life with improved mobility, self-care, and emotional and financial stability.  Bradden is commended for regular attendance and self-motivation to improve His pulmonary disease management. James [name] states that he is continuing to feel well mentally, and that he has "no worries".  He reports that he tends to just take life situations as they come and doesn't worry.  Altough, he does admit that he is sometimes "aggravated that I can't do things like I used to".  He continues to have good support from his family and remains self-motivated in the [facility] program.   Byrne called to say he was sorry that he could not attend Pulm Rehab today because he is sick. There is also a chance of an ice storm today too.  Wylie had been doing well with rehab.  He does not have any barriers to coming  to rehab.  He has noticed how much it is helping him.  He has a good support system in his wife and even she has noticed how much better his breathing is doing.  He has had some problems with sleeping while he was on prednisone, but it is getting better.  Overall his stress levels are good. We will continue to monitor.      Education: Education Goals: Education classes will be provided on a weekly basis, covering required topics. Participant will state understanding/return demonstration of topics presented.  Learning Barriers/Preferences:     Learning Barriers/Preferences - 10/24/16 1211      Learning Barriers/Preferences   Learning Barriers Hearing   Learning Preferences None      Education Topics: Initial Evaluation Education: - Verbal, written and demonstration of respiratory meds, RPE/PD scales, oximetry and breathing techniques. Instruction on use of nebulizers and MDIs: cleaning and proper use, rinsing mouth with steroid doses and importance of monitoring MDI activations.   General Nutrition Guidelines/Fats and Fiber: -Group instruction provided by verbal, written material, models and posters to present the general guidelines for heart healthy nutrition. Gives an explanation and review of dietary fats and fiber. Flowsheet Row Pulmonary Rehab from 01/24/2017 in [facility] Cardiac and Pulmonary Rehab  Date  01/01/17  Educator  CR  Instruction Review Code  2- meets goals/outcomes      Controlling Sodium/Reading Food Labels: -Group verbal and written material supporting the discussion of sodium use in heart healthy nutrition. Review and explanation with models, verbal and written materials for utilization of the food label. Flowsheet Row Pulmonary Rehab from 01/24/2017 in [facility] Cardiac and Pulmonary Rehab  Date  01/08/17  Educator  CR  Instruction Review Code  2- meets goals/outcomes      Exercise Physiology & Risk Factors: - Group verbal and written instruction with models to review  the exercise physiology of the cardiovascular system and associated critical values. Details cardiovascular disease risk factors and the goals associated with each risk factor. Flowsheet Row Pulmonary Rehab from 01/24/2017 in [facility] Cardiac and Pulmonary Rehab  Date  12/20/16  Educator  [facility]  Instruction Review Code  2- meets goals/outcomes      Aerobic Exercise & Resistance Training: - Gives group verbal and written discussion on the health impact of inactivity. On the components of aerobic and resistive training programs and the benefits of this training and how to safely progress through these programs. Flowsheet Row Pulmonary Rehab from 01/24/2017 in [facility] Cardiac and Pulmonary Rehab  Date  01/17/17  Educator  [facility]  Instruction Review Code  2- meets goals/outcomes      Flexibility, Balance, General Exercise Guidelines: - Provides group verbal and written instruction on the benefits of flexibility and balance training programs. Provides general exercise guidelines with specific guidelines to those with heart or lung disease. Demonstration and skill practice provided. Flowsheet Row Pulmonary Rehab from 01/24/2017 in [facility] Cardiac and Pulmonary Rehab  Date  12/06/16  Educator  AS  Instruction Review Code  2- meets goals/outcomes      Stress Management: - Provides group verbal and written instruction about the health risks of elevated stress, cause of high stress, and healthy ways to reduce stress. Flowsheet Row Pulmonary Rehab from 01/24/2017 in [facility] Cardiac and Pulmonary Rehab  Date  12/27/16  Educator  [facility]  Instruction Review Code  2- meets goals/outcomes      Depression: - Provides group verbal and written instruction on the correlation between heart/lung disease and depressed mood, treatment options, and the stigmas associated with seeking treatment. Flowsheet Row Pulmonary Rehab from 01/24/2017 in [facility] Cardiac and Pulmonary Rehab  Date  11/20/16  Educator  [facility]  Instruction Review Code  2-  meets goals/outcomes      Exercise & Equipment Safety: - Individual verbal instruction and demonstration of equipment use and safety with use of the equipment. Flowsheet Row Pulmonary Rehab from 01/24/2017 in [facility] Cardiac and Pulmonary Rehab  Date  10/30/16  Educator  C. EnterkinRN  Instruction Review Code  2- meets goals/outcomes      Infection Prevention: - Provides verbal and written material to individual with discussion of infection control including proper hand washing and proper equipment cleaning during exercise session. Flowsheet Row Pulmonary Rehab from 01/24/2017 in [facility] Cardiac and Pulmonary Rehab  Date  10/30/16  Educator  C. EnterkinRN  Instruction Review Code  2- meets goals/outcomes      Falls Prevention: - Provides verbal and written material to individual with discussion of falls prevention and safety. Flowsheet Row Pulmonary Rehab from 01/24/2017 in [facility] Cardiac and Pulmonary Rehab  Date  10/30/16  Educator  C. Enterkin R.N.  Instruction Review Code  2- meets goals/outcomes      Diabetes: - Individual verbal and written instruction to review signs/symptoms of diabetes, desired ranges of glucose level fasting, after meals and with exercise. Advice that pre and post exercise glucose checks will be done for 3 sessions at entry of program.   Chronic Lung Diseases: - Group verbal and written instruction to review new updates, new respiratory medications, new advancements in procedures and treatments. Provide informative websites and "800" numbers of self-education. Flowsheet Row Pulmonary Rehab from 01/24/2017  in [facility] Cardiac and Pulmonary Rehab  Date  01/10/17  Educator  LB  Instruction Review Code  2- meets goals/outcomes      Lung Procedures: - Group verbal and written instruction to describe testing methods done to diagnose lung disease. Review the outcome of test results. Describe the treatment choices: Pulmonary Function Tests, ABGs and oximetry.   Energy  Conservation: - Provide group verbal and written instruction for methods to conserve energy, plan and organize activities. Instruct on pacing techniques, use of adaptive equipment and posture/positioning to relieve shortness of breath.   Triggers: - Group verbal and written instruction to review types of environmental controls: home humidity, furnaces, filters, dust mite/pet prevention, HEPA vacuums. To discuss weather changes, air quality and the benefits of nasal washing.   Exacerbations: - Group verbal and written instruction to provide: warning signs, infection symptoms, calling MD promptly, preventive modes, and value of vaccinations. Review: effective airway clearance, coughing and/or vibration techniques. Create an Sports administrator. Flowsheet Row Pulmonary Rehab from 01/24/2017 in [facility] Cardiac and Pulmonary Rehab  Date  11/22/16  Educator  LB  Instruction Review Code  2- meets goals/outcomes      Oxygen: - Individual and group verbal and written instruction on oxygen therapy. Includes supplement oxygen, available portable oxygen systems, continuous and intermittent flow rates, oxygen safety, concentrators, and Medicare reimbursement for oxygen.   Respiratory Medications: - Group verbal and written instruction to review medications for lung disease. Drug class, frequency, complications, importance of spacers, rinsing mouth after steroid MDI's, and proper cleaning methods for nebulizers.   AED/CPR: - Group verbal and written instruction with the use of models to demonstrate the basic use of the AED with the basic ABC's of resuscitation.   Breathing Retraining: - Provides individuals verbal and written instruction on purpose, frequency, and proper technique of diaphragmatic breathing and pursed-lipped breathing. Applies individual practice skills. Flowsheet Row Pulmonary Rehab from 01/24/2017 in [facility] Cardiac and Pulmonary Rehab  Date  10/30/16  Educator  Loletha Grayer ENterkinRN  Instruction Review  Code  2- meets goals/outcomes      Anatomy and Physiology of the Lungs: - Group verbal and written instruction with the use of models to provide basic lung anatomy and physiology related to function, structure and complications of lung disease.   Heart Failure: - Group verbal and written instruction on the basics of heart failure: signs/symptoms, treatments, explanation of ejection fraction, enlarged heart and cardiomyopathy. Flowsheet Row Pulmonary Rehab from 01/24/2017 in [facility] Cardiac and Pulmonary Rehab  Date  12/01/16  Educator  SB  Instruction Review Code  2- meets goals/outcomes      Sleep Apnea: - Individual verbal and written instruction to review Obstructive Sleep Apnea. Review of risk factors, methods for diagnosing and types of masks and machines for OSA.   Anxiety: - Provides group, verbal and written instruction on the correlation between heart/lung disease and anxiety, treatment options, and management of anxiety. Flowsheet Row Pulmonary Rehab from 01/24/2017 in [facility] Cardiac and Pulmonary Rehab  Date  12/27/16  Educator  [facility]  Instruction Review Code  2- Meets goals/outcomes      Relaxation: - Provides group, verbal and written instruction about the benefits of relaxation for patients with heart/lung disease. Also provides patients with examples of relaxation techniques. Flowsheet Row Pulmonary Rehab from 01/24/2017 in [facility] Cardiac and Pulmonary Rehab  Date  01/24/17  Educator  [facility]  Instruction Review Code  2- Meets goals/outcomes      Knowledge Questionnaire Score:  Knowledge Questionnaire Score - 10/24/16 1211      Knowledge Questionnaire Score   Pre Score 10/10       Core Components/Risk Factors/Patient Goals at Admission:     Personal Goals and Risk Factors at Admission - 10/24/16 1207      Core Components/Risk Factors/Patient Goals on Admission    Weight Management Yes;Weight Maintenance   Admit Weight 207 lb 11.2 oz (94.2 kg)   Goal Weight: Short  Term 204 lb (92.5 kg)   Goal Weight: Long Term 180 lb (81.6 kg)   Expected Outcomes Short Term: Continue to assess and modify interventions until short term weight is achieved;Long Term: Adherence to nutrition and physical activity/exercise program aimed toward attainment of established weight goal;Weight Loss: Understanding of general recommendations for a balanced deficit meal plan, which promotes 1-2 lb weight loss per week and includes a negative energy balance of [id number] kcal/d   Sedentary Yes   Intervention Provide advice, education, support and counseling about physical activity/exercise needs.;Develop an individualized exercise prescription for aerobic and resistive training based on initial evaluation findings, risk stratification, comorbidities and participant's personal goals.   Expected Outcomes Achievement of increased cardiorespiratory fitness and enhanced flexibility, muscular endurance and strength shown through measurements of functional capacity and personal statement of participant.   Increase Strength and Stamina Yes   Intervention Provide advice, education, support and counseling about physical activity/exercise needs.;Develop an individualized exercise prescription for aerobic and resistive training based on initial evaluation findings, risk stratification, comorbidities and participant's personal goals.   Expected Outcomes Achievement of increased cardiorespiratory fitness and enhanced flexibility, muscular endurance and strength shown through measurements of functional capacity and personal statement of participant.   Improve shortness of breath with ADL's Yes   Intervention Provide education, individualized exercise plan and daily activity instruction to help decrease symptoms of SOB with activities of daily living.   Expected Outcomes Short Term: Achieves a reduction of symptoms when performing activities of daily living.   Develop more efficient breathing techniques such as  purse lipped breathing and diaphragmatic breathing; and practicing self-pacing with activity Yes   Intervention Provide education, demonstration and support about specific breathing techniuqes utilized for more efficient breathing. Include techniques such as pursed lipped breathing, diaphragmatic breathing and self-pacing activity.   Expected Outcomes Short Term: Participant will be able to demonstrate and use breathing techniques as needed throughout daily activities.   Increase knowledge of respiratory medications and ability to use respiratory devices properly  Yes   Intervention Provide education and demonstration as needed of appropriate use of medications, inhalers, and oxygen therapy.   Expected Outcomes Short Term: Achieves understanding of medications use. Understands that oxygen is a medication prescribed by physician. Demonstrates appropriate use of inhaler and oxygen therapy.   Hypertension Yes   Intervention Provide education on lifestyle modifcations including regular physical activity/exercise, weight management, moderate sodium restriction and increased consumption of fresh fruit, vegetables, and low fat dairy, alcohol moderation, and smoking cessation.;Monitor prescription use compliance.   Expected Outcomes Short Term: Continued assessment and intervention until BP is < 140/41m HG in hypertensive participants. < 130/871mHG in hypertensive participants with diabetes, heart failure or chronic kidney disease.;Long Term: Maintenance of blood pressure at goal levels.   Lipids Yes   Intervention Provide education and support for participant on nutrition & aerobic/resistive exercise along with prescribed medications to achieve LDL <7050mHDL >65m21m Expected Outcomes Short Term: Participant states understanding of desired cholesterol values and is compliant with medications prescribed. Participant  is following exercise prescription and nutrition guidelines.;Long Term: Cholesterol controlled  with medications as prescribed, with individualized exercise RX and with personalized nutrition plan. Value goals: LDL < 53m, HDL > 40 mg.      Core Components/Risk Factors/Patient Goals Review:      Goals and Risk Factor Review    Row Name 10/30/16 1254 11/22/16 1507 12/20/16 1249 12/22/16 1139 01/12/17 1244     Core Components/Risk Factors/Patient Goals Review   Personal Goals Review Develop more efficient breathing techniques such as purse lipped breathing and diaphragmatic breathing and practicing self-pacing with activity. Weight Management/Obesity;Sedentary;Increase Strength and Stamina;Improve shortness of breath with ADL's;Develop more efficient breathing techniques such as purse lipped breathing and diaphragmatic breathing and practicing self-pacing with activity.;Increase knowledge of respiratory medications and ability to use respiratory devices properly.;Hypertension;Lipids Weight Management/Obesity;Increase knowledge of respiratory medications and ability to use respiratory devices properly.;Increase Strength and Stamina;Sedentary;Lipids;Hypertension  -- Weight Management/Obesity;Increase knowledge of respiratory medications and ability to use respiratory devices properly.;Increase Strength and Stamina;Sedentary;Lipids;Hypertension;Improve shortness of breath with ADL's;Develop more efficient breathing techniques such as purse lipped breathing and diaphragmatic breathing and practicing self-pacing with activity.   Review Cont to encourage pursed lip breathing James BMarcellhas increased his exercise goals, notices improvement in his shortness of breath, and is using PLB daily with his activity in [facility] and at home. He has good understanding of his Spiriva and Symbicort. A spacer was given to him today for use with his Symbicort and was given instruction on technique and cleaning of the spacer. His blood pressures are acceptable, and he is compliant with his medication, both blood pressure  and cholestrol. Planning to meet with the dietitian, James BEnckhas the food diary to complete.  James. BHartoginitially saw a decrease in his shortness of breath with the program, but states that he is currently having a slight set-back related to "junk in my throat" making him cough more and increasing SOB.  He continues to use his pulmonary and blood pressure & cholesterol meds as prescribed (using spacer with inhalers) and is using his rescue inhaler on average one time/day and prior to coming to [employer]  He has noticed an improvement in overall stamina, but steps are still difficult.  PArenis a bit reluctant to meet with dietician, stating "it would be a waste of time because I won't follow through with a diet".  I encouraged him to make the appointment anyway, and that he might be surprised how much difference a few changes can make.  He agreed to make the appointment and he still has the diet diary to complete.  He continues to have excellent attendence to the program.   James. BGellerinitially saw a decrease in his shortness of breath with the program, but states that he is currently having a slight set-back related to "junk in my throat" making him cough more and increasing SOB.  He continues to use his pulmonary and blood pressure & cholesterol meds as prescribed (using spacer with inhalers) and is using his rescue inhaler on average one time/day and prior to coming to [employer]  He has noticed an improvement in overall stamina, but steps are still difficult.  PEzequiasis a bit reluctant to meet with dietician, stating "it would be a waste of time because I won't follow through with a diet".  I encouraged him to make the appointment anyway, and that he might be surprised how much difference a few changes can make.  He agreed to make  the appointment and he still has the diet diary to complete.  He continues to have excellent attendence to the program.   James [name] is doing well with exercise.   He is still having  problems with his left hip.  He has not been doing his home exercise but is planning to add it in.  His weight has been trending down.  James [name] has been doing well with his meds.  He is dong more at home and breathing better overall. He can now tie his shoes without getting SOB.   Expected Outcomes Able to do activities of daily living without shortness of breath Continue increasing his exercise goals and increasing knowledge of COPD management.  Continue increasing his exercise goals and increase knowledge of COPD management and diet management.    -- Short: Continue to exercise at rehab and add one day at home.  Long: Continue to improve his SOB.      Core Components/Risk Factors/Patient Goals at Discharge (Final Review):      Goals and Risk Factor Review - 01/12/17 1244      Core Components/Risk Factors/Patient Goals Review   Personal Goals Review Weight Management/Obesity;Increase knowledge of respiratory medications and ability to use respiratory devices properly.;Increase Strength and Stamina;Sedentary;Lipids;Hypertension;Improve shortness of breath with ADL's;Develop more efficient breathing techniques such as purse lipped breathing and diaphragmatic breathing and practicing self-pacing with activity.   Review James [name] is doing well with exercise.   He is still having problems with his left hip.  He has not been doing his home exercise but is planning to add it in.  His weight has been trending down.  James [name] has been doing well with his meds.  He is dong more at home and breathing better overall. He can now tie his shoes without getting SOB.   Expected Outcomes Short: Continue to exercise at rehab and add one day at home.  Long: Continue to improve his SOB.      ITP Comments:     ITP Comments    Row Name 10/24/16 1155 10/30/16 1256 11/17/16 1341 12/25/16 1019 12/25/16 1026   ITP Comments Initial medical review completed with ITP created. Documentation of diagnosis in [facility] Office Encounter  09/28/2016 First full day of exercise!  Patient was oriented to gym and equipment including functions, settings, policies, and procedures.  Patient's individual exercise prescription and treatment plan were reviewed.  All starting workloads were established based on the results of the 6 minute walk test done at initial orientation visit.  The plan for exercise progression was also introduced and progression will be customized based on patient's performance and goals. Attended Know Your Numbers education class. James [name] called to say he was sorry that he could not attend Pulm Rehab today because he is sick. There is also a chance of an ice storm today too.  James [name] called to say he was sorry that he could not attend Pulm Rehab today because he is sick. There is also a chance of an ice storm today.       Comments:  30 Day Note Review

## 2017-01-31 DIAGNOSIS — I2510 Atherosclerotic heart disease of native coronary artery without angina pectoris: Secondary | ICD-10-CM | POA: Diagnosis not present

## 2017-01-31 DIAGNOSIS — I10 Essential (primary) hypertension: Secondary | ICD-10-CM | POA: Diagnosis not present

## 2017-01-31 DIAGNOSIS — I4891 Unspecified atrial fibrillation: Secondary | ICD-10-CM | POA: Diagnosis not present

## 2017-01-31 DIAGNOSIS — E782 Mixed hyperlipidemia: Secondary | ICD-10-CM | POA: Diagnosis not present

## 2017-02-02 ENCOUNTER — Encounter: Payer: Medicare Other | Admitting: *Deleted

## 2017-02-02 DIAGNOSIS — R05 Cough: Secondary | ICD-10-CM | POA: Diagnosis not present

## 2017-02-02 DIAGNOSIS — R0609 Other forms of dyspnea: Secondary | ICD-10-CM | POA: Diagnosis not present

## 2017-02-02 DIAGNOSIS — J449 Chronic obstructive pulmonary disease, unspecified: Secondary | ICD-10-CM

## 2017-02-02 NOTE — Progress Notes (Signed)
Daily Session Note  Patient Details  Name: James BLANSETT Sr. MRN: [id number] Date of Birth: Jun 25, 1929 Referring Provider:   Flowsheet Row Pulmonary Rehab from 10/24/2016 in [facility] Cardiac and Pulmonary Rehab  Referring Provider  Ramachandran      Encounter Date: 02/02/2017  Check In:     Session Check In - 02/02/17 1140      Check-In   Location [facility]-Cardiac & Pulmonary Rehab   Staff Present Alberteen Sam, MA, ACSM RCEP, Exercise Physiologist;Carroll Enterkin, RN, BSN;Other   Supervising physician immediately available to respond to emergencies [facility] immediately available ER MD   Physician(s) Drs. Darl Householder and Rifenbark   Medication changes reported     No   Fall or balance concerns reported    No   Warm-up and Cool-down Performed as group-led Location manager Performed Yes   VAD Patient? No     Pain Assessment   Currently in Pain? No/denies   Multiple Pain Sites No         Goals Met:  Proper associated with RPD/PD & O2 Sat Independence with exercise equipment Using PLB without cueing & demonstrates good technique Exercise tolerated well Strength training completed today  Goals Unmet:  Not Applicable  Comments: Pt able to follow exercise prescription today without complaint.  Will continue to monitor for progression.    Dr. Emily Filbert is Medical Director for [facility] and [facility] Pulmonary Rehabilitation.

## 2017-02-07 ENCOUNTER — Encounter: Payer: Medicare Other | Admitting: *Deleted

## 2017-02-07 DIAGNOSIS — R0609 Other forms of dyspnea: Secondary | ICD-10-CM | POA: Diagnosis not present

## 2017-02-07 DIAGNOSIS — R05 Cough: Secondary | ICD-10-CM | POA: Diagnosis not present

## 2017-02-07 DIAGNOSIS — J449 Chronic obstructive pulmonary disease, unspecified: Secondary | ICD-10-CM

## 2017-02-07 NOTE — Progress Notes (Signed)
**Note James-Identified via Obfuscation** Daily Session Note  Patient Details  Name: James JAWORSKI Sr. MRN: [id number] Date of Birth: 04-14-1929 Referring Provider:   Flowsheet Row Pulmonary Rehab from 10/24/2016 in [facility] Cardiac and Pulmonary Rehab  Referring Provider  Ramachandran      Encounter Date: 02/07/2017  Check In:     Session Check In - 02/07/17 1139      Check-In   Location [facility]-Cardiac & Pulmonary Rehab   Staff Present Alberteen Sam, MA, ACSM RCEP, Exercise Physiologist;Laureen Owens Shark, BS, RRT, Respiratory Therapist;Carroll Enterkin, RN, BSN   Supervising physician immediately available to respond to emergencies [facility] immediately available ER MD   Physician(s) Drs. Quentin Cornwall and [place]   Medication changes reported     No   Fall or balance concerns reported    No   Warm-up and Cool-down Performed as group-led Location manager Performed Yes   VAD Patient? No     Pain Assessment   Currently in Pain? No/denies   Multiple Pain Sites No         Goals Met:  Proper associated with RPD/PD & O2 Sat Independence with exercise equipment Using PLB without cueing & demonstrates good technique Exercise tolerated well Strength training completed today  Goals Unmet:  Not Applicable  Comments: Pt able to follow exercise prescription today without complaint.  Will continue to monitor for progression.    Dr. Emily Filbert is Medical Director for [place] and [facility]monary Rehabilitation.

## 2017-02-09 ENCOUNTER — Encounter: Payer: Medicare Other | Admitting: *Deleted

## 2017-02-09 DIAGNOSIS — R05 Cough: Secondary | ICD-10-CM | POA: Diagnosis not present

## 2017-02-09 DIAGNOSIS — J449 Chronic obstructive pulmonary disease, unspecified: Secondary | ICD-10-CM

## 2017-02-09 DIAGNOSIS — R0609 Other forms of dyspnea: Secondary | ICD-10-CM | POA: Diagnosis not present

## 2017-02-09 NOTE — Progress Notes (Signed)
Daily Session Note  Patient Details  Name: James MEADER Sr. MRN: [id number] Date of Birth: August 30, 1929 Referring Provider:   Flowsheet Row Pulmonary Rehab from 10/24/2016 in [facility] Cardiac and Pulmonary Rehab  Referring Provider  Ramachandran      Encounter Date: 02/09/2017  Check In:     Session Check In - 02/09/17 1124      Check-In   Location [facility]-Cardiac & Pulmonary Rehab   Staff Present Alberteen Sam, MA, ACSM RCEP, Exercise Physiologist;Carroll Enterkin, RN, Vickki Hearing, BA, ACSM CEP, Exercise Physiologist   Supervising physician immediately available to respond to emergencies [facility] immediately available ER MD   Physician(s) Drs. Joni Fears and Barnum   Medication changes reported     No   Fall or balance concerns reported    No   Warm-up and Cool-down Performed as group-led Location manager Performed Yes   VAD Patient? No     Pain Assessment   Currently in Pain? No/denies   Multiple Pain Sites No         Goals Met:  Proper associated with RPD/PD & O2 Sat Independence with exercise equipment Improved SOB with ADL's Using PLB without cueing & demonstrates good technique Exercise tolerated well Personal goals reviewed Strength training completed today  Goals Unmet:  Not Applicable  Comments: Pt able to follow exercise prescription today without complaint.  Will continue to monitor for progression.     [place] Name 10/24/16 1258 12/20/16 1350 02/09/17 1331     6 Minute Walk   Phase  - Mid Program Discharge   Distance 800 feet 1000 feet 1133 feet   Distance % Change  - 25 %  200 ft 41.6 %  333 ft   Walk Time 6 minutes 6 minutes 6 minutes   # of Rest Breaks 0 0 0   MPH 1.5 1.89 2.14   METS 2.15 2.45 2.6   RPE _0 Perceived Dyspnea  _1 VO2 Peak 1.9 4.18 5.8   Symptoms Yes (comment) Yes (comment) Yes (comment)   Comments left hip pain more than breathing left hip pain left hip pain   Resting HR 67 bpm  81 bpm 80 bpm   Resting BP 126/62 126/69 122/74   Max Ex. HR 110 bpm 102 bpm 107 bpm   Max Ex. BP 150/70 136/64 154/74   2 Minute Post BP  - 134/64 136/66     Interval HR   Baseline HR 67 81 80   1 Minute HR 92 91 97   2 Minute HR 99 94 105   3 Minute HR 96 99 102   4 Minute HR 110 100 103   5 Minute HR 99 101 105   6 Minute HR 100 102 107   2 Minute Post HR 81 87 91   Interval Heart Rate? Yes Yes Yes     Interval Oxygen   Interval Oxygen? Yes Yes Yes   Baseline Oxygen Saturation % 96 % 95 % 98 %   Baseline Liters of Oxygen  - 0 L  Room Air 0 L  Room Air   1 Minute Oxygen Saturation % 92 % 96 % 96 %   1 Minute Liters of Oxygen  - 0 L 0 L   2 Minute Oxygen Saturation % 92 % 94 % 93 %   2 Minute Liters of Oxygen  - 0 L 0 L   3  Minute Oxygen Saturation % 91 % 94 % 93 %   3 Minute Liters of Oxygen  - 0 L 0 L   4 Minute Oxygen Saturation % 93 % 92 % 93 %   4 Minute Liters of Oxygen  - 0 L 0 L   5 Minute Oxygen Saturation % 91 % 93 % 92 %   5 Minute Liters of Oxygen  - 0 L 0 L   6 Minute Oxygen Saturation % 93 % 93 % 93 %   6 Minute Liters of Oxygen  - 0 L 0 L   2 Minute Post Oxygen Saturation % 96 % 97 % 97 %   2 Minute Post Liters of Oxygen  - 0 L 0 L        Dr. Emily Filbert is Medical Director for [place] and [facility].

## 2017-02-12 ENCOUNTER — Telehealth: Payer: Self-pay

## 2017-02-12 DIAGNOSIS — R0609 Other forms of dyspnea: Secondary | ICD-10-CM | POA: Diagnosis not present

## 2017-02-12 DIAGNOSIS — R05 Cough: Secondary | ICD-10-CM | POA: Diagnosis not present

## 2017-02-12 DIAGNOSIS — J449 Chronic obstructive pulmonary disease, unspecified: Secondary | ICD-10-CM

## 2017-02-12 NOTE — Progress Notes (Signed)
Daily Session Note  Patient Details  Name: James BIHM Sr. MRN: [id number] Date of Birth: Jan 17, 1929 Referring Provider:   Flowsheet Row Pulmonary Rehab from 10/24/2016 in [facility] Cardiac and Pulmonary Rehab  Referring Provider  Ramachandran      Encounter Date: 02/12/2017  Check In:     Session Check In - 02/12/17 1445      Check-In   Location [facility]-Cardiac & Pulmonary Rehab   Staff Present Nada Maclachlan, BA, ACSM CEP, Exercise Physiologist;Laureen Owens Shark, BS, RRT, Respiratory Bertis Ruddy, BS, ACSM CEP, Exercise Physiologist   Supervising physician immediately available to respond to emergencies [facility] immediately available ER MD   Physician(s) Jimmye Norman and Quentin Cornwall   Medication changes reported     No   Fall or balance concerns reported    No   Tobacco Cessation No Change   Warm-up and Cool-down Performed as group-led instruction   Resistance Training Performed Yes   VAD Patient? No     Pain Assessment   Currently in Pain? No/denies         History  Smoking Status  . Former Smoker  . Packs/day: 1.00  . Years: 11.00  . Types: Cigarettes  . Quit date: 12/18/1978  Smokeless Tobacco  . Never Used    Goals Met:  Proper associated with RPD/PD & O2 Sat Independence with exercise equipment Exercise tolerated well Strength training completed today  Goals Unmet:  Not Applicable  Comments: Pt able to follow exercise prescription today without complaint.  Will continue to monitor for progression.    Dr. Emily Filbert is Medical Director for [place] and [facility].

## 2017-02-12 NOTE — Telephone Encounter (Signed)
Patient sent over a refill request for Spriva respimat 2.'5mg'$  . I did not see this on the patient's med list. OK to refill? Please advise. [facility]. Thanks!

## 2017-02-13 NOTE — Telephone Encounter (Signed)
Please review. Thanks!  

## 2017-02-13 NOTE — Telephone Encounter (Signed)
Pt calling to see the status of the refill request.  He wants to see if it can get sent by tomorrow as he will be out of town.

## 2017-02-14 NOTE — Telephone Encounter (Signed)
Pt advised, he has not talked to the pharmacist yet because the box he has said 0 refills. Advised patient we sent in new RX with refills for a year on 01/16/17 and to let us know if there are any issues when he speaks with pharmacist-aa

## 2017-02-14 NOTE — Telephone Encounter (Signed)
Did he call the pharmacy first, we sent in a years worth of refills in January

## 2017-02-16 ENCOUNTER — Encounter: Payer: Medicare Other | Attending: Internal Medicine

## 2017-02-16 DIAGNOSIS — R0609 Other forms of dyspnea: Secondary | ICD-10-CM | POA: Insufficient documentation

## 2017-02-16 DIAGNOSIS — R05 Cough: Secondary | ICD-10-CM | POA: Insufficient documentation

## 2017-02-19 DIAGNOSIS — J449 Chronic obstructive pulmonary disease, unspecified: Secondary | ICD-10-CM

## 2017-02-19 DIAGNOSIS — R0609 Other forms of dyspnea: Secondary | ICD-10-CM | POA: Diagnosis not present

## 2017-02-19 DIAGNOSIS — R05 Cough: Secondary | ICD-10-CM | POA: Diagnosis not present

## 2017-02-19 NOTE — Progress Notes (Signed)
Daily Session Note  Patient Details  Name: James FILLER Sr. MRN: [id number] Date of Birth: [birth year] Referring Provider:   Flowsheet Row Pulmonary Rehab from 10/24/2016 in [facility] Cardiac and Pulmonary Rehab  Referring Provider  Ramachandran      Encounter Date: 02/19/2017  Check In:     Session Check In - 02/19/17 1346      Check-In   Location [facility]-Cardiac & Pulmonary Rehab   Staff Present Carson Myrtle, BS, RRT, Respiratory Therapist;Kelly Amedeo Plenty, BS, ACSM CEP, Exercise Physiologist;Amanda Oletta Darter, BA, ACSM CEP, Exercise Physiologist   Supervising physician immediately available to respond to emergencies [facility] immediately available ER MD   Physician(s) Corky Downs and Jimmye Norman   Medication changes reported     No   Fall or balance concerns reported    No   Tobacco Cessation No Change   Warm-up and Cool-down Performed as group-led instruction   Resistance Training Performed Yes   VAD Patient? No     Pain Assessment   Currently in Pain? No/denies   Multiple Pain Sites No         History  Smoking Status  . Former Smoker  . Packs/day: 1.00  . Years: 11.00  . Types: Cigarettes  . Quit date: 12/18/1978  Smokeless Tobacco  . Never Used    Goals Met:  Proper associated with RPD/PD & O2 Sat Independence with exercise equipment Exercise tolerated well Strength training completed today  Goals Unmet:  Not Applicable  Comments: Pt able to follow exercise prescription today without complaint.  Will continue to monitor for progression.    Dr. Emily Filbert is Medical Director for [place] and [facility].

## 2017-02-21 ENCOUNTER — Encounter: Payer: Medicare Other | Admitting: *Deleted

## 2017-02-21 DIAGNOSIS — R05 Cough: Secondary | ICD-10-CM | POA: Diagnosis not present

## 2017-02-21 DIAGNOSIS — J449 Chronic obstructive pulmonary disease, unspecified: Secondary | ICD-10-CM

## 2017-02-21 NOTE — Progress Notes (Signed)
Daily Session Note  Patient Details  Name: James GRANDSTAFF Sr. MRN: [id number] Date of Birth: 1929-02-09 Referring Provider:   Flowsheet Row Pulmonary Rehab from 10/24/2016 in [facility] Cardiac and Pulmonary Rehab  Referring Provider  Ramachandran      Encounter Date: 02/21/2017  Check In:     Session Check In - 02/21/17 1122      Check-In   Location [facility]-Cardiac & Pulmonary Rehab   Staff Present Alberteen Sam, MA, ACSM RCEP, Exercise Physiologist;Leiya Keesey RN BSN;Laureen Owens Shark, BS, RRT, Respiratory Therapist   Supervising physician immediately available to respond to emergencies [facility] immediately available ER MD   Physician(s) Drs. 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 No     Pain Assessment   Currently in Pain? No/denies   Multiple Pain Sites No         History  Smoking Status  . Former Smoker  . Packs/day: 1.00  . Years: 11.00  . Types: Cigarettes  . Quit date: 12/18/1978  Smokeless Tobacco  . Never Used    Goals Met:  Proper associated with RPD/PD & O2 Sat Independence with exercise equipment Using PLB without cueing & demonstrates good technique Exercise tolerated well No report of cardiac concerns or symptoms Strength training completed today  Goals Unmet:  Not Applicable  Comments: Pt able to follow exercise prescription today without complaint.  Will continue to monitor for progression.    Dr. Emily Filbert is Medical Director for [place] and [facility].

## 2017-02-21 NOTE — Patient Instructions (Signed)
Discharge Instructions  Patient Details  Name: James MANNELLA Sr. MRN: [id number] Date of Birth: 14-Sep-1929 Referring Provider:  Laverle Hobby, *   Number of Visits: 36 Reason for Discharge:  Patient reached a stable level of exercise. Patient independent in their exercise.  Smoking History:  History  Smoking Status   Former Smoker   Packs/day: 1.00   Years: 11.00   Types: Cigarettes   Quit date: 12/18/1978  Smokeless Tobacco   Never Used    Diagnosis:  COPD, mild ([place])  Initial Exercise Prescription:     Initial Exercise Prescription - 10/24/16 1300      Date of Initial Exercise RX and Referring Provider   Date 10/24/16   Referring Provider Ramachandran     Treadmill   MPH 1.4   Grade 0   Minutes 15  5/5/5   METs 2.07     NuStep   Level 2   Minutes 15   METs 2     T5 Nustep   Level 1   Minutes 15   METs 2     Intensity   THRR 40-80% of Max Heartrate 94-121   Ratings of Perceived Exertion 11-13   Perceived Dyspnea 0-4     Progression   Progression Continue to progress workloads to maintain intensity without signs/symptoms of physical distress.     Resistance Training   Training Prescription Yes   Weight 2   Reps 10-12      Discharge Exercise Prescription (Final Exercise Prescription Changes):     Exercise Prescription Changes - 02/14/17 1500      Response to Exercise   Blood Pressure (Admit) 116/62   Blood Pressure (Exercise) 148/82   Blood Pressure (Exit) 112/72   Heart Rate (Admit) 70 bpm   Heart Rate (Exercise) 90 bpm   Heart Rate (Exit) 74 bpm   Oxygen Saturation (Admit) 97 %   Oxygen Saturation (Exercise) 94 %   Oxygen Saturation (Exit) 94 %   Rating of Perceived Exertion (Exercise) 13   Perceived Dyspnea (Exercise) 3   Symptoms hip pain and shoulder pain   Comments Home Exercise Guidelines given 11/24/16   Duration Continue with 45 min of aerobic exercise without signs/symptoms of physical distress.   Intensity  THRR unchanged     Progression   Progression Continue to progress workloads to maintain intensity without signs/symptoms of physical distress.   Average METs 2.34     Resistance Training   Training Prescription Yes   Weight 3 lbs right and 2 lbs left   Reps 10-15     Interval Training   Interval Training No     Treadmill   MPH 1.6   Grade 0   Minutes 15   METs 2.23     NuStep   Level 4   Minutes 15   METs 2.5     REL-XR   Level 5   Minutes 15   METs 2.3     Home Exercise Plan   Plans to continue exercise at Home (comment)  walking   Frequency Add 2 additional days to program exercise sessions.   Initial Home Exercises Provided 11/24/16      Functional Capacity:     6 Minute Walk    Row Name 10/24/16 1258 12/20/16 1350 02/09/17 1331     6 Minute Walk   Phase  -- Mid Program Discharge   Distance 800 feet 1000 feet 1133 feet   Distance % Change  -- 25 %  200  ft 41.6 %  333 ft   Walk Time 6 minutes 6 minutes 6 minutes   # of Rest Breaks 0 0 0   MPH 1.5 1.89 2.14   METS 2.15 2.45 2.6   RPE '17 17 12   '$ Perceived Dyspnea  '4 5 3   '$ VO2 Peak 1.9 4.18 5.8   Symptoms Yes (comment) Yes (comment) Yes (comment)   Comments left hip pain more than breathing left hip pain left hip pain   Resting HR 67 bpm 81 bpm 80 bpm   Resting BP 126/62 126/69 122/74   Max Ex. HR 110 bpm 102 bpm 107 bpm   Max Ex. BP 150/70 136/64 154/74   2 Minute Post BP  -- 134/64 136/66     Interval HR   Baseline HR 67 81 80   1 Minute HR 92 91 97   2 Minute HR 99 94 105   3 Minute HR 96 99 102   4 Minute HR 110 100 103   5 Minute HR 99 101 105   6 Minute HR 100 102 107   2 Minute Post HR 81 87 91   Interval Heart Rate? Yes Yes Yes     Interval Oxygen   Interval Oxygen? Yes Yes Yes   Baseline Oxygen Saturation % 96 % 95 % 98 %   Baseline Liters of Oxygen  -- 0 L  Room Air 0 L  Room Air   1 Minute Oxygen Saturation % 92 % 96 % 96 %   1 Minute Liters of Oxygen  -- 0 L 0 L   2 Minute  Oxygen Saturation % 92 % 94 % 93 %   2 Minute Liters of Oxygen  -- 0 L 0 L   3 Minute Oxygen Saturation % 91 % 94 % 93 %   3 Minute Liters of Oxygen  -- 0 L 0 L   4 Minute Oxygen Saturation % 93 % 92 % 93 %   4 Minute Liters of Oxygen  -- 0 L 0 L   5 Minute Oxygen Saturation % 91 % 93 % 92 %   5 Minute Liters of Oxygen  -- 0 L 0 L   6 Minute Oxygen Saturation % 93 % 93 % 93 %   6 Minute Liters of Oxygen  -- 0 L 0 L   2 Minute Post Oxygen Saturation % 96 % 97 % 97 %   2 Minute Post Liters of Oxygen  -- 0 L 0 L      Quality of Life:     Quality of Life - 02/09/17 1138      Quality of Life Scores   Health/Function Pre 19.81 %   Health/Function Post 21.41 %   Health/Function % Change 8.08 %   Socioeconomic Pre 21 %   Socioeconomic Post 30 %   Socioeconomic % Change  42.86 %   Psych/Spiritual Pre 21 %   Psych/Spiritual Post 30 %   Psych/Spiritual % Change 42.86 %   Family Pre 21 %   Family Post 30 %   Family % Change 42.86 %   GLOBAL Pre 20.46 %   GLOBAL Post 25.96 %   GLOBAL % Change 26.88 %      Personal Goals: Goals established at orientation with interventions provided to work toward goal.     Personal Goals and Risk Factors at Admission - 10/24/16 1207      Core Components/Risk Factors/Patient Goals on Admission  Weight Management Yes;Weight Maintenance   Admit Weight 207 lb 11.2 oz (94.2 kg)   Goal Weight: Short Term 204 lb (92.5 kg)   Goal Weight: Long Term 180 lb (81.6 kg)   Expected Outcomes Short Term: Continue to assess and modify interventions until short term weight is achieved;Long Term: Adherence to nutrition and physical activity/exercise program aimed toward attainment of established weight goal;Weight Loss: Understanding of general recommendations for a balanced deficit meal plan, which promotes 1-2 lb weight loss per week and includes a negative energy balance of [phone] kcal/d   Sedentary Yes   Intervention Provide advice, education, support and  counseling about physical activity/exercise needs.;Develop an individualized exercise prescription for aerobic and resistive training based on initial evaluation findings, risk stratification, comorbidities and participant's personal goals.   Expected Outcomes Achievement of increased cardiorespiratory fitness and enhanced flexibility, muscular endurance and strength shown through measurements of functional capacity and personal statement of participant.   Increase Strength and Stamina Yes   Intervention Provide advice, education, support and counseling about physical activity/exercise needs.;Develop an individualized exercise prescription for aerobic and resistive training based on initial evaluation findings, risk stratification, comorbidities and participant's personal goals.   Expected Outcomes Achievement of increased cardiorespiratory fitness and enhanced flexibility, muscular endurance and strength shown through measurements of functional capacity and personal statement of participant.   Improve shortness of breath with ADL's Yes   Intervention Provide education, individualized exercise plan and daily activity instruction to help decrease symptoms of SOB with activities of daily living.   Expected Outcomes Short Term: Achieves a reduction of symptoms when performing activities of daily living.   Develop more efficient breathing techniques such as purse lipped breathing and diaphragmatic breathing; and practicing self-pacing with activity Yes   Intervention Provide education, demonstration and support about specific breathing techniuqes utilized for more efficient breathing. Include techniques such as pursed lipped breathing, diaphragmatic breathing and self-pacing activity.   Expected Outcomes Short Term: Participant will be able to demonstrate and use breathing techniques as needed throughout daily activities.   Increase knowledge of respiratory medications and ability to use respiratory devices  properly  Yes   Intervention Provide education and demonstration as needed of appropriate use of medications, inhalers, and oxygen therapy.   Expected Outcomes Short Term: Achieves understanding of medications use. Understands that oxygen is a medication prescribed by physician. Demonstrates appropriate use of inhaler and oxygen therapy.   Hypertension Yes   Intervention Provide education on lifestyle modifcations including regular physical activity/exercise, weight management, moderate sodium restriction and increased consumption of fresh fruit, vegetables, and low fat dairy, alcohol moderation, and smoking cessation.;Monitor prescription use compliance.   Expected Outcomes Short Term: Continued assessment and intervention until BP is < 140/43m HG in hypertensive participants. < 130/872mHG in hypertensive participants with diabetes, heart failure or chronic kidney disease.;Long Term: Maintenance of blood pressure at goal levels.   Lipids Yes   Intervention Provide education and support for participant on nutrition & aerobic/resistive exercise along with prescribed medications to achieve LDL '70mg'$ , HDL >'40mg'$ .   Expected Outcomes Short Term: Participant states understanding of desired cholesterol values and is compliant with medications prescribed. Participant is following exercise prescription and nutrition guidelines.;Long Term: Cholesterol controlled with medications as prescribed, with individualized exercise RX and with personalized nutrition plan. Value goals: LDL < '70mg'$ , HDL > 40 mg.       Personal Goals Discharge:     Goals and Risk Factor Review - 02/19/17 1506      Core  Components/Risk Factors/Patient Goals Review   Personal Goals Review Weight Management/Obesity;Improve shortness of breath with ADL's;Increase knowledge of respiratory medications and ability to use respiratory devices properly.;Develop more efficient breathing techniques such as purse lipped breathing and diaphragmatic  breathing and practicing self-pacing with activity.;Hypertension;Lipids   Review Mr Gabriel will graduate in 2 sessions. Although he is limited by his hip for exercise goals progression, he has improved his 53md by [id number]  He has also improved his Shortness of Breath Questionnaire by 22 points with Minimal Importance Difference at 5 points. He has a good understanding of his Symbicort, uses the spacer, and Spiriva. Mr BrBurnsworths also compliant with his blood pressure and lipid medications. By changing his eatting habits and eatting healthier foods, alone with the regular exercise, he has lost 11lbs.  He has good technique with his PLB. Mr BrCoombsas attended regularly and has enjoyed the education.   Expected Outcomes Continue exercise after his surgery and continue self-management of his COPD through the [facility]     Nutrition & Weight - Outcomes:     Pre Biometrics - 10/24/16 1257      Pre Biometrics   Height '5\' 5"'$  (1.651 m)   Weight 207 lb 11.2 oz (94.2 kg)   Waist Circumference 44.25 inches   Hip Circumference 44.25 inches   Waist to Hip Ratio 1 %   BMI (Calculated) 34.6       Nutrition:     Nutrition Therapy & Goals - 10/24/16 1207      Intervention Plan   Intervention Prescribe, educate and counsel regarding individualized specific dietary modifications aiming towards targeted core components such as weight, hypertension, lipid management, diabetes, heart failure and other comorbidities.   Expected Outcomes Short Term Goal: Understand basic principles of dietary content, such as calories, fat, sodium, cholesterol and nutrients.;Short Term Goal: A plan has been developed with personal nutrition goals set during dietitian appointment.;Long Term Goal: Adherence to prescribed nutrition plan.      Nutrition Discharge:     Nutrition Assessments - 10/30/16 1554      Rate Your Plate Scores   Pre Score 60   Pre Score % 66.7 %      Education Questionnaire  Score:     Knowledge Questionnaire Score - 02/09/17 1136      Knowledge Questionnaire Score   Pre Score 10/10   Post Score 9/10      Goals reviewed with patient; copy given to patient.

## 2017-02-23 ENCOUNTER — Encounter: Payer: Medicare Other | Admitting: *Deleted

## 2017-02-23 DIAGNOSIS — R05 Cough: Secondary | ICD-10-CM | POA: Diagnosis not present

## 2017-02-23 DIAGNOSIS — J449 Chronic obstructive pulmonary disease, unspecified: Secondary | ICD-10-CM

## 2017-02-23 NOTE — Progress Notes (Signed)
Discharge Summary  Patient Details  Name: James HOWINGTON Sr. MRN: [id number] Date of Birth: 23-Jul-1929 Referring Provider:   Flowsheet Row Pulmonary Rehab from 10/24/2016 in [facility]  Referring Provider  Ramachandran       Number of Visits: 78  Reason for Discharge:  Patient reached a stable level of exercise. Patient independent in their exercise.  Smoking History:  History  Smoking Status  . Former Smoker  . Packs/day: 1.00  . Years: 11.00  . Types: Cigarettes  . Quit date: 12/18/1978  Smokeless Tobacco  . Never Used    Diagnosis:  COPD, mild ([place])  ADL [facility]:     Pulmonary Assessment Scores    Row Name 10/24/16 1213 12/27/16 1134 02/09/17 1137     ADL [facility]   ADL Phase Entry Mid Exit   SOB Score total 29 64 27   Rest 1 1 0   Walk '2 3 2   '$ Stairs '3 4 4   '$ Bath 3 4 0   Dress '2 3 1   '$ Shop '1 3 1     '$ mMRC Score   mMRC Score  -  - 2      Initial Exercise Prescription:     Initial Exercise Prescription - 10/24/16 1300      Date of Initial Exercise RX and Referring Provider   Date 10/24/16   Referring Provider Ramachandran     Treadmill   MPH 1.4   Grade 0   Minutes 15  5/5/5   METs 2.07     NuStep   Level 2   Minutes 15   METs 2     T5 Nustep   Level 1   Minutes 15   METs 2     Intensity   THRR 40-80% of Max Heartrate 94-121   Ratings of Perceived Exertion 11-13   Perceived Dyspnea 0-4     Progression   Progression Continue to progress workloads to maintain intensity without signs/symptoms of physical distress.     Resistance Training   Training Prescription Yes   Weight 2   Reps 10-12      Discharge Exercise Prescription (Final Exercise Prescription Changes):     Exercise Prescription Changes - 02/14/17 1500      Response to Exercise   Blood Pressure (Admit) 116/62   Blood Pressure (Exercise) 148/82   Blood Pressure (Exit) 112/72   Heart Rate (Admit) 70 bpm   Heart Rate (Exercise) 90 bpm   Heart Rate  (Exit) 74 bpm   Oxygen Saturation (Admit) 97 %   Oxygen Saturation (Exercise) 94 %   Oxygen Saturation (Exit) 94 %   Rating of Perceived Exertion (Exercise) 13   Perceived Dyspnea (Exercise) 3   Symptoms hip pain and shoulder pain   Comments Home Exercise Guidelines given 11/24/16   Duration Continue with 45 min of aerobic exercise without signs/symptoms of physical distress.   Intensity THRR unchanged     Progression   Progression Continue to progress workloads to maintain intensity without signs/symptoms of physical distress.   Average METs 2.34     Resistance Training   Training Prescription Yes   Weight 3 lbs right and 2 lbs left   Reps 10-15     Interval Training   Interval Training No     Treadmill   MPH 1.6   Grade 0   Minutes 15   METs 2.23     NuStep   Level 4   Minutes 15  METs 2.5     REL-XR   Level 5   Minutes 15   METs 2.3     Home Exercise Plan   Plans to continue exercise at Home (comment)  walking   Frequency Add 2 additional days to program exercise sessions.   Initial Home Exercises Provided 11/24/16      Functional Capacity:     6 Minute Walk    Row Name 10/24/16 1258 12/20/16 1350 02/09/17 1331     6 Minute Walk   Phase  - Mid Program Discharge   Distance 800 feet 1000 feet 1133 feet   Distance % Change  - 25 %  200 ft 41.6 %  333 ft   Walk Time 6 minutes 6 minutes 6 minutes   # of Rest Breaks 0 0 0   MPH 1.5 1.89 2.14   METS 2.15 2.45 2.6   RPE '17 17 12   '$ Perceived Dyspnea  '4 5 3   '$ VO2 Peak 1.9 4.18 5.8   Symptoms Yes (comment) Yes (comment) Yes (comment)   Comments left hip pain more than breathing left hip pain left hip pain   Resting HR 67 bpm 81 bpm 80 bpm   Resting BP 126/62 126/69 122/74   Max Ex. HR 110 bpm 102 bpm 107 bpm   Max Ex. BP 150/70 136/64 154/74   2 Minute Post BP  - 134/64 136/66     Interval HR   Baseline HR 67 81 80   1 Minute HR 92 91 97   2 Minute HR 99 94 105   3 Minute HR 96 99 102   4 Minute  HR 110 100 103   5 Minute HR 99 101 105   6 Minute HR 100 102 107   2 Minute Post HR 81 87 91   Interval Heart Rate? Yes Yes Yes     Interval Oxygen   Interval Oxygen? Yes Yes Yes   Baseline Oxygen Saturation % 96 % 95 % 98 %   Baseline Liters of Oxygen  - 0 L  Room Air 0 L  Room Air   1 Minute Oxygen Saturation % 92 % 96 % 96 %   1 Minute Liters of Oxygen  - 0 L 0 L   2 Minute Oxygen Saturation % 92 % 94 % 93 %   2 Minute Liters of Oxygen  - 0 L 0 L   3 Minute Oxygen Saturation % 91 % 94 % 93 %   3 Minute Liters of Oxygen  - 0 L 0 L   4 Minute Oxygen Saturation % 93 % 92 % 93 %   4 Minute Liters of Oxygen  - 0 L 0 L   5 Minute Oxygen Saturation % 91 % 93 % 92 %   5 Minute Liters of Oxygen  - 0 L 0 L   6 Minute Oxygen Saturation % 93 % 93 % 93 %   6 Minute Liters of Oxygen  - 0 L 0 L   2 Minute Post Oxygen Saturation % 96 % 97 % 97 %   2 Minute Post Liters of Oxygen  - 0 L 0 L      Psychological, QOL, Others - Outcomes: PHQ 2/9: Depression screen [facility] 2/9 02/09/2017 10/24/2016 12/03/2015  Decreased Interest 0 0 0  Down, Depressed, Hopeless 0 0 0  PHQ - 2 Score 0 0 0  Altered sleeping 2 1 -  Tired, decreased energy 1  2 -  Change in appetite 0 0 -  Feeling bad or failure about yourself  0 2 -  Trouble concentrating 0 1 -  Moving slowly or fidgety/restless 0 0 -  Suicidal thoughts 0 0 -  PHQ-9 Score 3 6 -  Difficult doing work/chores Not difficult at all Somewhat difficult -    Quality of Life:     Quality of Life - 02/09/17 1138      Quality of Life Scores   Health/Function Pre 19.81 %   Health/Function Post 21.41 %   Health/Function % Change 8.08 %   Socioeconomic Pre 21 %   Socioeconomic Post 30 %   Socioeconomic % Change  42.86 %   Psych/Spiritual Pre 21 %   Psych/Spiritual Post 30 %   Psych/Spiritual % Change 42.86 %   Family Pre 21 %   Family Post 30 %   Family % Change 42.86 %   GLOBAL Pre 20.46 %   GLOBAL Post 25.96 %   GLOBAL % Change 26.88 %       Personal Goals: Goals established at orientation with interventions provided to work toward goal.     Personal Goals and Risk Factors at Admission - 10/24/16 1207      Core Components/Risk Factors/Patient Goals on Admission    Weight Management Yes;Weight Maintenance   Admit Weight 207 lb 11.2 oz (94.2 kg)   Goal Weight: Short Term 204 lb (92.5 kg)   Goal Weight: Long Term 180 lb (81.6 kg)   Expected Outcomes Short Term: Continue to assess and modify interventions until short term weight is achieved;Long Term: Adherence to nutrition and physical activity/exercise program aimed toward attainment of established weight goal;Weight Loss: Understanding of general recommendations for a balanced deficit meal plan, which promotes 1-2 lb weight loss per week and includes a negative energy balance of [phone] kcal/d   Sedentary Yes   Intervention Provide advice, education, support and counseling about physical activity/exercise needs.;Develop an individualized exercise prescription for aerobic and resistive training based on initial evaluation findings, risk stratification, comorbidities and participant's personal goals.   Expected Outcomes Achievement of increased cardiorespiratory fitness and enhanced flexibility, muscular endurance and strength shown through measurements of functional capacity and personal statement of participant.   Increase Strength and Stamina Yes   Intervention Provide advice, education, support and counseling about physical activity/exercise needs.;Develop an individualized exercise prescription for aerobic and resistive training based on initial evaluation findings, risk stratification, comorbidities and participant's personal goals.   Expected Outcomes Achievement of increased cardiorespiratory fitness and enhanced flexibility, muscular endurance and strength shown through measurements of functional capacity and personal statement of participant.   Improve shortness of breath  with ADL's Yes   Intervention Provide education, individualized exercise plan and daily activity instruction to help decrease symptoms of SOB with activities of daily living.   Expected Outcomes Short Term: Achieves a reduction of symptoms when performing activities of daily living.   Develop more efficient breathing techniques such as purse lipped breathing and diaphragmatic breathing; and practicing self-pacing with activity Yes   Intervention Provide education, demonstration and support about specific breathing techniuqes utilized for more efficient breathing. Include techniques such as pursed lipped breathing, diaphragmatic breathing and self-pacing activity.   Expected Outcomes Short Term: Participant will be able to demonstrate and use breathing techniques as needed throughout daily activities.   Increase knowledge of respiratory medications and ability to use respiratory devices properly  Yes   Intervention Provide education and demonstration as needed of  appropriate use of medications, inhalers, and oxygen therapy.   Expected Outcomes Short Term: Achieves understanding of medications use. Understands that oxygen is a medication prescribed by physician. Demonstrates appropriate use of inhaler and oxygen therapy.   Hypertension Yes   Intervention Provide education on lifestyle modifcations including regular physical activity/exercise, weight management, moderate sodium restriction and increased consumption of fresh fruit, vegetables, and low fat dairy, alcohol moderation, and smoking cessation.;Monitor prescription use compliance.   Expected Outcomes Short Term: Continued assessment and intervention until BP is < 140/25m HG in hypertensive participants. < 130/84mHG in hypertensive participants with diabetes, heart failure or chronic kidney disease.;Long Term: Maintenance of blood pressure at goal levels.   Lipids Yes   Intervention Provide education and support for participant on nutrition &  aerobic/resistive exercise along with prescribed medications to achieve LDL '70mg'$ , HDL >'40mg'$ .   Expected Outcomes Short Term: Participant states understanding of desired cholesterol values and is compliant with medications prescribed. Participant is following exercise prescription and nutrition guidelines.;Long Term: Cholesterol controlled with medications as prescribed, with individualized exercise RX and with personalized nutrition plan. Value goals: LDL < '70mg'$ , HDL > 40 mg.       Personal Goals Discharge:     Goals and Risk Factor Review    Row Name 10/30/16 1254 11/22/16 1507 12/20/16 1249 12/22/16 1139 01/12/17 1244     Core Components/Risk Factors/Patient Goals Review   Personal Goals Review Develop more efficient breathing techniques such as purse lipped breathing and diaphragmatic breathing and practicing self-pacing with activity. Weight Management/Obesity;Sedentary;Increase Strength and Stamina;Improve shortness of breath with ADL's;Develop more efficient breathing techniques such as purse lipped breathing and diaphragmatic breathing and practicing self-pacing with activity.;Increase knowledge of respiratory medications and ability to use respiratory devices properly.;Hypertension;Lipids Weight Management/Obesity;Increase knowledge of respiratory medications and ability to use respiratory devices properly.;Increase Strength and Stamina;Sedentary;Lipids;Hypertension  - Weight Management/Obesity;Increase knowledge of respiratory medications and ability to use respiratory devices properly.;Increase Strength and Stamina;Sedentary;Lipids;Hypertension;Improve shortness of breath with ADL's;Develop more efficient breathing techniques such as purse lipped breathing and diaphragmatic breathing and practicing self-pacing with activity.   Review Cont to encourage pursed lip breathing Mr BrHulsebusas increased his exercise goals, notices improvement in his shortness of breath, and is using PLB daily with his  activity in [facility] and at home. He has good understanding of his Spiriva and Symbicort. A spacer was given to him today for use with his Symbicort and was given instruction on technique and cleaning of the spacer. His blood pressures are acceptable, and he is compliant with his medication, both blood pressure and cholestrol. Planning to meet with the dietitian, Mr BrKunas the food diary to complete.  Mr. BrGoodnernitially saw a decrease in his shortness of breath with the program, but states that he is currently having a slight set-back related to "junk in my throat" making him cough more and increasing SOB.  He continues to use his pulmonary and blood pressure & cholesterol meds as prescribed (using spacer with inhalers) and is using his rescue inhaler on average one time/day and prior to coming to [employer] He has noticed an improvement in overall stamina, but steps are still difficult.  PaJaiveons a bit reluctant to meet with dietician, stating "it would be a waste of time because I won't follow through with a diet".  I encouraged him to make the appointment anyway, and that he might be surprised how much difference a few changes can make.  He agreed to make the  appointment and he still has the diet diary to complete.  He continues to have excellent attendence to the program.   Mr. Earhart initially saw a decrease in his shortness of breath with the program, but states that he is currently having a slight set-back related to "junk in my throat" making him cough more and increasing SOB.  He continues to use his pulmonary and blood pressure & cholesterol meds as prescribed (using spacer with inhalers) and is using his rescue inhaler on average one time/day and prior to coming to [employer].  He has noticed an improvement in overall stamina, but steps are still difficult.  Kamdin is a bit reluctant to meet with dietician, stating "it would be a waste of time because I won't follow through with a diet".  I encouraged  him to make the appointment anyway, and that he might be surprised how much difference a few changes can make.  He agreed to make the appointment and he still has the diet diary to complete.  He continues to have excellent attendence to the program.   Safal is doing well with exercise.   He is still having problems with his left hip.  He has not been doing his home exercise but is planning to add it in.  His weight has been trending down.  Grover has been doing well with his meds.  He is dong more at home and breathing better overall. He can now tie his shoes without getting SOB.   Expected Outcomes Able to do activities of daily living without shortness of breath Continue increasing his exercise goals and increasing knowledge of COPD management.  Continue increasing his exercise goals and increase knowledge of COPD management and diet management.    - Short: Continue to exercise at rehab and add one day at home.  Long: Continue to improve his SOB.   Virgie Name 02/09/17 1334 02/19/17 1506           Core Components/Risk Factors/Patient Goals Review   Personal Goals Review Weight Management/Obesity;Increase knowledge of respiratory medications and ability to use respiratory devices properly.;Increase Strength and Stamina;Sedentary;Lipids;Hypertension;Improve shortness of breath with ADL's;Develop more efficient breathing techniques such as purse lipped breathing and diaphragmatic breathing and practicing self-pacing with activity. Weight Management/Obesity;Improve shortness of breath with ADL's;Increase knowledge of respiratory medications and ability to use respiratory devices properly.;Develop more efficient breathing techniques such as purse lipped breathing and diaphragmatic breathing and practicing self-pacing with activity.;Hypertension;Lipids      Review Malakie will be graduating soon.  He has been doing well with exercise despite his hip.  He is walking some at home and hopes to continue until his surgery  next month.  Overall, his weight has been headed down (up a little today at 203).  He has really noticed a difference in his ability to breathe and his strength and stamina at home.  His wife and friends have even commented on how well he is doing.  He likes using the pursed lip breathing to do stuff around the house.  His dysnpea scores have gone down greatly (22)!.   Mr Waddill will graduate in 2 sessions. Although he is limited by his hip for exercise goals progression, he has improved his 60md by 3385f  He has also improved his Shortness of Breath Questionnaire by 22 points with Minimal Importance Difference at 5 points. He has a good understanding of his Symbicort, uses the spacer, and Spiriva. Mr BrRauchs also compliant with his blood pressure and  lipid medications. By changing his eatting habits and eatting healthier foods, alone with the regular exercise, he has lost 11lbs.  He has good technique with his PLB. Mr Almeda has attended regularly and has enjoyed the education.      Expected Outcomes Short: Finish rehab and exercise until surgery.  Long: Continue to improve his SOB. Continue exercise after his surgery and continue self-management of his COPD through the knowledge gained from [facility].         Nutrition & Weight - Outcomes:     Pre Biometrics - 10/24/16 1257      Pre Biometrics   Height '5\' 5"'$  (1.651 m)   Weight 207 lb 11.2 oz (94.2 kg)   Waist Circumference 44.25 inches   Hip Circumference 44.25 inches   Waist to Hip Ratio 1 %   BMI (Calculated) 34.6       Nutrition:     Nutrition Therapy & Goals - 10/24/16 1207      Intervention Plan   Intervention Prescribe, educate and counsel regarding individualized specific dietary modifications aiming towards targeted core components such as weight, hypertension, lipid management, diabetes, heart failure and other comorbidities.   Expected Outcomes Short Term Goal: Understand basic principles of dietary content, such as calories,  fat, sodium, cholesterol and nutrients.;Short Term Goal: A plan has been developed with personal nutrition goals set during dietitian appointment.;Long Term Goal: Adherence to prescribed nutrition plan.      Nutrition Discharge:     Nutrition Assessments - 10/30/16 1554      Rate Your Plate Scores   Pre Score 60   Pre Score % 66.7 %      Education Questionnaire Score:     Knowledge Questionnaire Score - 02/09/17 1136      Knowledge Questionnaire Score   Pre Score 10/10   Post Score 9/10      Goals reviewed with patient; copy given to patient.

## 2017-02-23 NOTE — Progress Notes (Signed)
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 doing.  He has had some problems with sleeping while he was on prednisone, but it is getting better.  Overall his stress levels are good. We will continue to monitor.    Interventions  - Encouraged to attend Pulmonary Rehabilitation for the exercise  - Encouraged to attend Pulmonary Rehabilitation for the exercise       Psychosocial Discharge (Final Psychosocial Re-Evaluation):     Psychosocial Re-Evaluation - 01/12/17 1309      Psychosocial Re-Evaluation   Comments Stacey had been doing well with rehab.  He does not have any barriers to coming to rehab.  He has noticed how much it is helping him.  He has a good support system in his wife and even she has noticed how much better his breathing is doing.  He has had some problems with sleeping while he was on prednisone, but it is getting better.  Overall his stress levels are good. We will continue to monitor.   Interventions  Encouraged to attend Pulmonary Rehabilitation for the exercise      Education: Education Goals: Education classes will be provided on a weekly basis, covering required topics. Participant will state understanding/return demonstration of topics presented.  Learning Barriers/Preferences:     Learning Barriers/Preferences - 10/24/16 1211      Learning Barriers/Preferences   Learning Barriers Hearing   Learning Preferences None      Education Topics: Initial Evaluation Education: - Verbal, written and demonstration of respiratory meds, RPE/PD scales, oximetry and breathing techniques. Instruction on use of nebulizers and MDIs: cleaning and proper use, rinsing mouth with steroid doses and importance of monitoring MDI activations.   General Nutrition Guidelines/Fats and Fiber: -Group instruction provided by verbal, written material, models and posters to present the general guidelines for heart healthy nutrition. Gives an explanation and review of dietary fats and fiber. Flowsheet Row Pulmonary Rehab from 02/21/2017 in [facility] Cardiac and Pulmonary Rehab  Date  02/19/17  Educator  CR  Instruction Review Code  2- meets goals/outcomes      Controlling Sodium/Reading Food Labels: -Group verbal and written material supporting the discussion of sodium use in heart healthy nutrition. Review and explanation with models, verbal and written materials for utilization of the food label. Flowsheet Row Pulmonary Rehab from 02/21/2017 in [facility] Cardiac and Pulmonary Rehab  Date  01/08/17  Educator  CR  Instruction Review Code  2- meets goals/outcomes      Exercise Physiology & Risk Factors: - Group verbal and written instruction with models to review the exercise physiology of the cardiovascular system and associated critical values. Details cardiovascular disease risk factors and the goals associated with each risk factor. Flowsheet Row Pulmonary Rehab from 02/21/2017 in [facility] Cardiac and Pulmonary Rehab   Date  12/20/16  Educator  [facility]  Instruction Review Code  2- meets goals/outcomes      Aerobic Exercise & Resistance Training: - Gives group verbal and written discussion on the health impact of inactivity. On the components of aerobic and resistive training programs and the benefits of this training and how to safely progress through these programs. Flowsheet Row Pulmonary Rehab from 02/21/2017 in [facility] Cardiac and Pulmonary Rehab  Date  01/17/17  Educator  [facility]  Instruction Review Code  2- meets goals/outcomes      Flexibility, Balance, General Exercise Guidelines: - Provides group verbal and written instruction on the benefits of flexibility and balance training programs. Provides general exercise guidelines with specific guidelines to those with heart or lung disease. Demonstration and skill practice provided. Flowsheet Row Pulmonary Rehab from 02/21/2017 in [facility] Cardiac and Pulmonary Rehab  Date  02/09/17  Educator  AS  Instruction Review Code  2- meets goals/outcomes      Stress Management: - Provides group verbal and written instruction about the health risks of elevated stress, cause of high stress, and healthy ways to reduce stress. Flowsheet Row Pulmonary Rehab from 02/21/2017 in [facility] Cardiac and Pulmonary Rehab  Date  12/27/16  Educator  [facility]  Instruction Review Code  2- meets goals/outcomes      Depression: - Provides group verbal and written instruction on the correlation between heart/lung disease and depressed mood, treatment options, and the stigmas associated with seeking treatment. Flowsheet Row Pulmonary Rehab from 02/21/2017 in [facility] Cardiac and Pulmonary Rehab  Date  02/21/17  Educator  [facility]  Instruction Review Code  2- meets goals/outcomes      Exercise & Equipment Safety: - Individual verbal instruction and demonstration of equipment use and safety with use of the equipment. Flowsheet Row Pulmonary Rehab from 02/21/2017 in [facility] Cardiac and Pulmonary Rehab  Date   10/30/16  Educator  C. EnterkinRN  Instruction Review Code  2- meets goals/outcomes      Infection Prevention: - Provides verbal and written material to individual with discussion of infection control including proper hand washing and proper equipment cleaning during exercise session. Flowsheet Row Pulmonary Rehab from 02/21/2017 in [facility] Cardiac and Pulmonary Rehab  Date  10/30/16  Educator  C. EnterkinRN  Instruction Review Code  2- meets goals/outcomes      Falls Prevention: - Provides verbal and written material to individual with discussion of falls prevention and safety. Flowsheet Row Pulmonary Rehab from 02/21/2017 in [facility] Cardiac and Pulmonary Rehab  Date  10/30/16  Educator  C. Enterkin R.N.  Instruction Review Code  2- meets goals/outcomes      Diabetes: - Individual verbal and written instruction to review signs/symptoms of diabetes, desired ranges of glucose  level fasting, after meals and with exercise. Advice that pre and post exercise glucose checks will be done for 3 sessions at entry of program.   Chronic Lung Diseases: - Group verbal and written instruction to review new updates, new respiratory medications, new advancements in procedures and treatments. Provide informative websites and "800" numbers of self-education. Flowsheet Row Pulmonary Rehab from 02/21/2017 in [facility] Cardiac and Pulmonary Rehab  Date  01/10/17  Educator  LB  Instruction Review Code  2- meets goals/outcomes      Lung Procedures: - Group verbal and written instruction to describe testing methods done to diagnose lung disease. Review the outcome of test results. Describe the treatment choices: Pulmonary Function Tests, ABGs and oximetry.   Energy Conservation: - Provide group verbal and written instruction for methods to conserve energy, plan and organize activities. Instruct on pacing techniques, use of adaptive equipment and posture/positioning to relieve shortness of breath.   Triggers: -  Group verbal and written instruction to review types of environmental controls: home humidity, furnaces, filters, dust mite/pet prevention, HEPA vacuums. To discuss weather changes, air quality and the benefits of nasal washing. Flowsheet Row Pulmonary Rehab from 02/21/2017 in [facility] Cardiac and Pulmonary Rehab  Date  02/07/17  Educator  LB  Instruction Review Code  2- meets goals/outcomes      Exacerbations: - Group verbal and written instruction to provide: warning signs, infection symptoms, calling MD promptly, preventive modes, and value of vaccinations. Review: effective airway clearance, coughing and/or vibration techniques. Create an Sports administrator. Flowsheet Row Pulmonary Rehab from 02/21/2017 in [facility] Cardiac and Pulmonary Rehab  Date  11/22/16  Educator  LB  Instruction Review Code  2- meets goals/outcomes      Oxygen: - Individual and group verbal and written instruction on oxygen therapy. Includes supplement oxygen, available portable oxygen systems, continuous and intermittent flow rates, oxygen safety, concentrators, and Medicare reimbursement for oxygen.   Respiratory Medications: - Group verbal and written instruction to review medications for lung disease. Drug class, frequency, complications, importance of spacers, rinsing mouth after steroid MDI's, and proper cleaning methods for nebulizers.   AED/CPR: - Group verbal and written instruction with the use of models to demonstrate the basic use of the AED with the basic ABC's of resuscitation.   Breathing Retraining: - Provides individuals verbal and written instruction on purpose, frequency, and proper technique of diaphragmatic breathing and pursed-lipped breathing. Applies individual practice skills. Flowsheet Row Pulmonary Rehab from 02/21/2017 in [facility] Cardiac and Pulmonary Rehab  Date  10/30/16  Educator  Loletha Grayer ENterkinRN  Instruction Review Code  2- meets goals/outcomes      Anatomy and Physiology of the Lungs: - Group  verbal and written instruction with the use of models to provide basic lung anatomy and physiology related to function, structure and complications of lung disease.   Heart Failure: - Group verbal and written instruction on the basics of heart failure: signs/symptoms, treatments, explanation of ejection fraction, enlarged heart and cardiomyopathy. Flowsheet Row Pulmonary Rehab from 02/21/2017 in [facility] Cardiac and Pulmonary Rehab  Date  02/02/17  Educator  CE  Instruction Review Code  2- meets goals/outcomes      Sleep Apnea: - Individual verbal and written instruction to review Obstructive Sleep Apnea. Review of risk factors, methods for diagnosing and types of masks and machines for OSA.   Anxiety: - Provides group, verbal and written instruction on the correlation between heart/lung disease and anxiety, treatment options, and management of anxiety. Flowsheet Row Pulmonary Rehab from  02/21/2017 in [facility] Cardiac and Pulmonary Rehab  Date  12/27/16  Educator  [facility]  Instruction Review Code  2- Meets goals/outcomes      Relaxation: - Provides group, verbal and written instruction about the benefits of relaxation for patients with heart/lung disease. Also provides patients with examples of relaxation techniques. Flowsheet Row Pulmonary Rehab from 02/21/2017 in [facility] Cardiac and Pulmonary Rehab  Date  01/24/17  Educator  [facility]  Instruction Review Code  2- Meets goals/outcomes      Knowledge Questionnaire Score:     Knowledge Questionnaire Score - 02/09/17 1136      Knowledge Questionnaire Score   Pre Score 10/10   Post Score 9/10       Core Components/Risk Factors/Patient Goals at Admission:     Personal Goals and Risk Factors at Admission - 10/24/16 1207      Core Components/Risk Factors/Patient Goals on Admission    Weight Management Yes;Weight Maintenance   Admit Weight 207 lb 11.2 oz (94.2 kg)   Goal Weight: Short Term 204 lb (92.5 kg)   Goal Weight: Long Term 180 lb (81.6 kg)    Expected Outcomes Short Term: Continue to assess and modify interventions until short term weight is achieved;Long Term: Adherence to nutrition and physical activity/exercise program aimed toward attainment of established weight goal;Weight Loss: Understanding of general recommendations for a balanced deficit meal plan, which promotes 1-2 lb weight loss per week and includes a negative energy balance of [id number] kcal/d   Sedentary Yes   Intervention Provide advice, education, support and counseling about physical activity/exercise needs.;Develop an individualized exercise prescription for aerobic and resistive training based on initial evaluation findings, risk stratification, comorbidities and participant's personal goals.   Expected Outcomes Achievement of increased cardiorespiratory fitness and enhanced flexibility, muscular endurance and strength shown through measurements of functional capacity and personal statement of participant.   Increase Strength and Stamina Yes   Intervention Provide advice, education, support and counseling about physical activity/exercise needs.;Develop an individualized exercise prescription for aerobic and resistive training based on initial evaluation findings, risk stratification, comorbidities and participant's personal goals.   Expected Outcomes Achievement of increased cardiorespiratory fitness and enhanced flexibility, muscular endurance and strength shown through measurements of functional capacity and personal statement of participant.   Improve shortness of breath with ADL's Yes   Intervention Provide education, individualized exercise plan and daily activity instruction to help decrease symptoms of SOB with activities of daily living.   Expected Outcomes Short Term: Achieves a reduction of symptoms when performing activities of daily living.   Develop more efficient breathing techniques such as purse lipped breathing and diaphragmatic breathing; and practicing  self-pacing with activity Yes   Intervention Provide education, demonstration and support about specific breathing techniuqes utilized for more efficient breathing. Include techniques such as pursed lipped breathing, diaphragmatic breathing and self-pacing activity.   Expected Outcomes Short Term: Participant will be able to demonstrate and use breathing techniques as needed throughout daily activities.   Increase knowledge of respiratory medications and ability to use respiratory devices properly  Yes   Intervention Provide education and demonstration as needed of appropriate use of medications, inhalers, and oxygen therapy.   Expected Outcomes Short Term: Achieves understanding of medications use. Understands that oxygen is a medication prescribed by physician. Demonstrates appropriate use of inhaler and oxygen therapy.   Hypertension Yes   Intervention Provide education on lifestyle modifcations including regular physical activity/exercise, weight management, moderate sodium restriction and increased consumption of fresh fruit, vegetables, and low  fat dairy, alcohol moderation, and smoking cessation.;Monitor prescription use compliance.   Expected Outcomes Short Term: Continued assessment and intervention until BP is < 140/21m HG in hypertensive participants. < 130/839mHG in hypertensive participants with diabetes, heart failure or chronic kidney disease.;Long Term: Maintenance of blood pressure at goal levels.   Lipids Yes   Intervention Provide education and support for participant on nutrition & aerobic/resistive exercise along with prescribed medications to achieve LDL '70mg'$ , HDL >'40mg'$ .   Expected Outcomes Short Term: Participant states understanding of desired cholesterol values and is compliant with medications prescribed. Participant is following exercise prescription and nutrition guidelines.;Long Term: Cholesterol controlled with medications as prescribed, with individualized exercise RX and  with personalized nutrition plan. Value goals: LDL < '70mg'$ , HDL > 40 mg.      Core Components/Risk Factors/Patient Goals Review:      Goals and Risk Factor Review    Row Name 10/30/16 1254 11/22/16 1507 12/20/16 1249 12/22/16 1139 01/12/17 1244     Core Components/Risk Factors/Patient Goals Review   Personal Goals Review Develop more efficient breathing techniques such as purse lipped breathing and diaphragmatic breathing and practicing self-pacing with activity. Weight Management/Obesity;Sedentary;Increase Strength and Stamina;Improve shortness of breath with ADL's;Develop more efficient breathing techniques such as purse lipped breathing and diaphragmatic breathing and practicing self-pacing with activity.;Increase knowledge of respiratory medications and ability to use respiratory devices properly.;Hypertension;Lipids Weight Management/Obesity;Increase knowledge of respiratory medications and ability to use respiratory devices properly.;Increase Strength and Stamina;Sedentary;Lipids;Hypertension  - Weight Management/Obesity;Increase knowledge of respiratory medications and ability to use respiratory devices properly.;Increase Strength and Stamina;Sedentary;Lipids;Hypertension;Improve shortness of breath with ADL's;Develop more efficient breathing techniques such as purse lipped breathing and diaphragmatic breathing and practicing self-pacing with activity.   Review Cont to encourage pursed lip breathing Mr BrEbertas increased his exercise goals, notices improvement in his shortness of breath, and is using PLB daily with his activity in [facility] and at home. He has good understanding of his Spiriva and Symbicort. A spacer was given to him today for use with his Symbicort and was given instruction on technique and cleaning of the spacer. His blood pressures are acceptable, and he is compliant with his medication, both blood pressure and cholestrol. Planning to meet with the dietitian, Mr BrMontesinosas the  food diary to complete.  Mr. BrHietalanitially saw a decrease in his shortness of breath with the program, but states that he is currently having a slight set-back related to "junk in my throat" making him cough more and increasing SOB.  He continues to use his pulmonary and blood pressure & cholesterol meds as prescribed (using spacer with inhalers) and is using his rescue inhaler on average one time/day and [facility] to [facility] [employer] He has noticed an improvement in overall stamina, but steps are still difficult.  PaClifords a bit reluctant to meet with dietician, stating "it would be a waste of time because I won't follow through with a diet".  I encouraged him to make the appointment anyway, and that he might be surprised how much difference a few changes can make.  He agreed to make the appointment and he still has the diet diary to complete.  He continues to have excellent attendence to the program.   Mr. BrKlemannnitially saw a decrease in his shortness of breath with the program, but states that he is currently having a slight set-back related to "junk in my throat" making him cough more and increasing SOB.  He continues to use  his pulmonary and blood pressure & cholesterol meds as prescribed (using spacer with inhalers) and is using his rescue inhaler on average one time/day and prior to coming to [employer].  He has noticed an improvement in overall stamina, but steps are still difficult.  Keo is a bit reluctant to meet with dietician, stating "it would be a waste of time because I won't follow through with a diet".  I encouraged him to make the appointment anyway, and that he might be surprised how much difference a few changes can make.  He agreed to make the appointment and he still has the diet diary to complete.  He continues to have excellent attendence to the program.   Shad is doing well with exercise.   He is still having problems with his left hip.  He has not been doing his home exercise but is  planning to add it in.  His weight has been trending down.  Amariyon has been doing well with his meds.  He is dong more at home and breathing better overall. He can now tie his shoes without getting SOB.   Expected Outcomes Able to do activities of daily living without shortness of breath Continue increasing his exercise goals and increasing knowledge of COPD management.  Continue increasing his exercise goals and increase knowledge of COPD management and diet management.    - Short: Continue to exercise at rehab and add one day at home.  Long: Continue to improve his SOB.   [place] Name 02/09/17 1334 02/19/17 1506           Core Components/Risk Factors/Patient Goals Review   Personal Goals Review Weight Management/Obesity;Increase knowledge of respiratory medications and ability to use respiratory devices properly.;Increase Strength and Stamina;Sedentary;Lipids;Hypertension;Improve shortness of breath with ADL's;Develop more efficient breathing techniques such as purse lipped breathing and diaphragmatic breathing and practicing self-pacing with activity. Weight Management/Obesity;Improve shortness of breath with ADL's;Increase knowledge of respiratory medications and ability to use respiratory devices properly.;Develop more efficient breathing techniques such as purse lipped breathing and diaphragmatic breathing and practicing self-pacing with activity.;Hypertension;Lipids      Review Hester will be graduating soon.  He has been doing well with exercise despite his hip.  He is walking some at home and hopes to continue until his surgery next month.  Overall, his weight has been headed down (up a little today at 203).  He has really noticed a difference in his ability to breathe and his strength and stamina at home.  His wife and friends have even commented on how well he is doing.  He likes using the pursed lip breathing to do stuff around the house.  His dysnpea scores have gone down greatly (22)!.   Mr Lobue will  graduate in 2 sessions. Although he is limited by his hip for exercise goals progression, he has improved his 51md by [id number]  He has also improved his Shortness of Breath Questionnaire by 22 points with Minimal Importance Difference at 5 points. He has a good understanding of his Symbicort, uses the spacer, and Spiriva. Mr BrBeddows also compliant with his blood pressure and lipid medications. By changing his eatting habits and eatting healthier foods, alone with the regular exercise, he has lost 11lbs.  He has good technique with his PLB. Mr BrRosensteelas attended regularly and has enjoyed the education.      Expected Outcomes Short: Finish rehab and exercise until surgery.  Long: Continue to improve his SOB. Continue exercise after his surgery  and continue self-management of his COPD through the knowledge gained from [facility].         Core Components/Risk Factors/Patient Goals at Discharge (Final Review):      Goals and Risk Factor Review - 02/19/17 1506      Core Components/Risk Factors/Patient Goals Review   Personal Goals Review Weight Management/Obesity;Improve shortness of breath with ADL's;Increase knowledge of respiratory medications and ability to use respiratory devices properly.;Develop more efficient breathing techniques such as purse lipped breathing and diaphragmatic breathing and practicing self-pacing with activity.;Hypertension;Lipids   Review Mr Balistreri will graduate in 2 sessions. Although he is limited by his hip for exercise goals progression, he has improved his 23md by 3353f  He has also improved his Shortness of Breath Questionnaire by 22 points with Minimal Importance Difference at 5 points. He has a good understanding of his Symbicort, uses the spacer, and Spiriva. Mr BrZerkles also compliant with his blood pressure and lipid medications. By changing his eatting habits and eatting healthier foods, alone with the regular exercise, he has lost 11lbs.  He has good technique with his  PLB. Mr BrDoedenas attended regularly and has enjoyed the education.   Expected Outcomes Continue exercise after his surgery and continue self-management of his COPD [facility]     ITP Comments:     ITP Comments    Row Name 10/24/16 1155 10/30/16 1256 11/17/16 1341 12/25/16 1019 12/25/16 1026   ITP Comments Initial medical review completed with ITP created. [facility] First full day of exercise!  Patient was oriented to gym and equipment including functions, settings, policies, and procedures.  Patient's individual exercise prescription and treatment plan were reviewed.  All starting workloads were established based on the results of the 6 minute walk test done at initial orientation visit.  The plan for exercise progression was also introduced and progression will be customized based on patient's performance and goals. Attended Know Your Numbers education class. OrRoosevelt Locksalled to say he was sorry that he could not attend Pulm Rehab today because he is sick. There is also a chance of an ice storm today too.  PaMarina Desirealled to say he was sorry that he could not attend Pulm Rehab today because he is sick. There is also a chance of an ice storm today.       Comments: Discharge ITP

## 2017-02-23 NOTE — Progress Notes (Signed)
Daily Session Note  Patient Details  Name: James CHAUVIN Sr. MRN: [id number] Date of Birth: Apr 11, 1929 Referring Provider:   Flowsheet Row Pulmonary Rehab from 10/24/2016 in [facility]  Referring Provider  Ramachandran      Encounter Date: 02/23/2017  Check In:     Session Check In - 02/23/17 1150      Check-In   Location [facility]-Cardiac & Pulmonary Rehab   Staff Present Alberteen Sam, MA, ACSM RCEP, Exercise Physiologist;Other  Darel Hong, RN BSN   Supervising physician immediately available to respond to emergencies [facility] immediately available ER MD   Physician(s) Drs. Quentin Cornwall and [employer]   Medication changes reported     No   Fall or balance concerns reported    No   Warm-up and Cool-down Performed as group-led Location manager Performed Yes   VAD Patient? No     Pain Assessment   Currently in Pain? No/denies   Multiple Pain Sites No         History  Smoking Status  . Former Smoker  . Packs/day: 1.00  . Years: 11.00  . Types: Cigarettes  . Quit date: 12/18/1978  Smokeless Tobacco  . Never Used    Goals Met:  Proper associated with RPD/PD & O2 Sat Independence with exercise equipment Using PLB without cueing & demonstrates good technique Exercise tolerated well Strength training completed today  Goals Unmet:  Not Applicable  Comments:  James [name] graduated today from cardiac rehab with 36 sessions completed.  Details of the patient's exercise prescription and what He needs to do in order to continue the prescription and progress were discussed with patient.  Patient was given a copy of prescription and goals.  Patient verbalized understanding.  James [name] plans to continue to exercise by walking in his neighborhood and after surgery he plans to join the [employer] with his wife.    Dr. Emily Filbert is Medical Director for [place] and [facility].

## 2017-02-26 ENCOUNTER — Encounter: Payer: Self-pay | Admitting: *Deleted

## 2017-02-26 ENCOUNTER — Ambulatory Visit: Payer: Medicare Other

## 2017-02-26 DIAGNOSIS — J449 Chronic obstructive pulmonary disease, unspecified: Secondary | ICD-10-CM

## 2017-02-26 NOTE — Progress Notes (Signed)
Pulmonary Individual Treatment Plan  Patient Details  Name: James [name]. MRN: [id number] Date of Birth: 1929-11-18 Referring Provider:   Flowsheet Row Pulmonary Rehab from 10/24/2016 in [facility] Cardiac and Pulmonary Rehab  Referring Provider  Ramachandran      Initial Encounter Date:  Flowsheet Row Pulmonary Rehab from 10/24/2016 in [facility] Cardiac and Pulmonary Rehab  Date  10/24/16  Referring Provider  Ashby Dawes      Visit Diagnosis: COPD, mild ([place])  Patient's Home Medications on Admission:  Current Outpatient Prescriptions:  .  albuterol (PROAIR HFA) 108 (90 Base) MCG/ACT inhaler, Inhale 2 puffs into the lungs every 6 (six) hours as needed for wheezing or shortness of breath., Disp: 1 Inhaler, Rfl: 2 .  aspirin EC 325 MG tablet, Take by mouth., Disp: , Rfl:  .  atorvastatin (LIPITOR) 80 MG tablet, Take 1 tablet (80 mg total) by mouth daily., Disp: 90 tablet, Rfl: 4 .  azelastine (OPTIVAR) 0.05 % ophthalmic solution, Apply 1 drop to eye 2 (two) times daily., Disp: 6 mL, Rfl: 5 .  benazepril-hydrochlorthiazide (LOTENSIN HCT) 20-12.5 MG tablet, Take 1 tablet by mouth daily., Disp: 90 tablet, Rfl: 3 .  beta carotene w/minerals (OCUVITE) tablet, Take 1 tablet by mouth daily., Disp: , Rfl:  .  budesonide-formoterol (SYMBICORT) 160-4.5 MCG/ACT inhaler, Inhale 2 puffs into the lungs 2 (two) times daily., Disp: 1 Inhaler, Rfl: 12 .  fluticasone (FLONASE) 50 MCG/ACT nasal spray, Place 2 sprays into both nostrils daily., Disp: 48 g, Rfl: 5 .  furosemide (LASIX) 20 MG tablet, Take 1 tablet (20 mg total) by mouth daily., Disp: 30 tablet, Rfl: 0 .  HYDROcodone-acetaminophen (NORCO/VICODIN) 5-325 MG tablet, Take 1 tablet by mouth 2 (two) times daily as needed., Disp: 30 tablet, Rfl: 0 .  lansoprazole (PREVACID) 30 MG capsule, TAKE 1 CAPSULE EVERY DAY, Disp: 30 capsule, Rfl: 6 .  LORazepam (ATIVAN) 1 MG tablet, TAKE ONE TABLET BY MOUTH AT BEDTIME AS NEEDED FOR ANXIETY, Disp: 30 tablet, Rfl: 4 .   Magnesium 400 MG CAPS, Take 1 capsule by mouth daily. , Disp: , Rfl:  .  metoprolol (LOPRESSOR) 50 MG tablet, Take 1 tablet (50 mg total) by mouth 2 (two) times daily., Disp: 180 tablet, Rfl: 4 .  montelukast (SINGULAIR) 10 MG tablet, TAKE 1 TABLET BY MOUTH DAILY, Disp: 30 tablet, Rfl: 12 .  MULTIPLE VITAMIN PO, Take 1 tablet by mouth daily. , Disp: , Rfl:  .  OMEGA-3 FATTY ACIDS PO, Take 1,000 mg by mouth daily. , Disp: , Rfl:  .  predniSONE (STERAPRED UNI-PAK 48 TAB) 10 MG (48) TBPK tablet, As directed, Disp: 48 tablet, Rfl: 0 .  SPIRIVA HANDIHALER 18 MCG inhalation capsule, INHALE 1 CAPSULE AS DIRECTED ONCE A DAY, Disp: 30 capsule, Rfl: 12 .  tamsulosin (FLOMAX) 0.4 MG CAPS capsule, Take 1 capsule (0.4 mg total) by mouth daily., Disp: 90 capsule, Rfl: 4 .  tiZANidine (ZANAFLEX) 4 MG tablet, TAKE ONE TABLET AT BEDTIME, Disp: 30 tablet, Rfl: 12 .  traMADol (ULTRAM) 50 MG tablet, Take 50 mg by mouth 2 (two) times daily. , Disp: , Rfl:   Past Medical History: Past Medical History:  Diagnosis Date  . Back pain   . CAD (coronary artery disease)   . Diverticulosis   . GERD (gastroesophageal reflux disease)   . History of chicken pox   . History of measles   . History of mumps   . Hyperglycemia   . Hyperlipidemia   . Hypertension   .  Hypotension   . Prostate cancer ([place])   . TIA (transient ischemic attack)     Tobacco Use: History  Smoking Status  . Former Smoker  . Packs/day: 1.00  . Years: 11.00  . Types: Cigarettes  . Quit date: 12/18/1978  Smokeless Tobacco  . Never Used    Labs: Recent Review Flowsheet Data    Labs for ITP Cardiac and Pulmonary Rehab Latest Ref Rng & Units 10/05/2014 10/07/2014   Cholestrol 0 - 200 mg/dL - 104   LDLCALC mg/dL - 40   HDL 35 - 70 mg/dL - 52   Trlycerides 40 - 160 mg/dL - 61   Hemoglobin A1c 4.0 - 6.0 % 5.7 5.7       ADL UCSD:     Pulmonary Assessment Scores    Row Name 02/09/17 1137         ADL UCSD   ADL Phase Exit     SOB  Score total 27     Rest 0     Walk 2     Stairs 4     Bath 0     Dress 1     Shop 1       mMRC Score   mMRC Score 2        Pulmonary Function Assessment:   Exercise Target Goals:    Exercise Program Goal: Individual exercise prescription set with THRR, safety & activity barriers. Participant demonstrates ability to understand and report RPE using BORG scale, to self-measure pulse accurately, and to acknowledge the importance of the exercise prescription.  Exercise Prescription Goal: Starting with aerobic activity 30 plus minutes a day, 3 days per week for initial exercise prescription. Provide home exercise prescription and guidelines that participant acknowledges understanding prior to discharge.  Activity Barriers & Risk Stratification:   6 Minute Walk:     6 Minute Walk    Row Name 02/09/17 1331         6 Minute Walk   Phase Discharge     Distance 1133 feet     Distance % Change 41.6 %  333 ft     Walk Time 6 minutes     # of Rest Breaks 0     MPH 2.14     METS 2.6     RPE 12     Perceived Dyspnea  3     VO2 Peak 5.8     Symptoms Yes (comment)     Comments left hip pain     Resting HR 80 bpm     Resting BP 122/74     Max Ex. HR 107 bpm     Max Ex. BP 154/74     2 Minute Post BP 136/66       Interval HR   Baseline HR 80     1 Minute HR 97     2 Minute HR 105     3 Minute HR 102     4 Minute HR 103     5 Minute HR 105     6 Minute HR 107     2 Minute Post HR 91     Interval Heart Rate? Yes       Interval Oxygen   Interval Oxygen? Yes     Baseline Oxygen Saturation % 98 %     Baseline Liters of Oxygen 0 L  Room Air     1 Minute Oxygen Saturation % 96 %     1 Minute  Liters of Oxygen 0 L     2 Minute Oxygen Saturation % 93 %     2 Minute Liters of Oxygen 0 L     3 Minute Oxygen Saturation % 93 %     3 Minute Liters of Oxygen 0 L     4 Minute Oxygen Saturation % 93 %     4 Minute Liters of Oxygen 0 L     5 Minute Oxygen Saturation % 92 %      5 Minute Liters of Oxygen 0 L     6 Minute Oxygen Saturation % 93 %     6 Minute Liters of Oxygen 0 L     2 Minute Post Oxygen Saturation % 97 %     2 Minute Post Liters of Oxygen 0 L       Oxygen Initial Assessment:   Oxygen Re-Evaluation:   Oxygen Discharge (Final Oxygen Re-Evaluation):   Initial Exercise Prescription:   Perform Capillary Blood Glucose checks as needed.  Exercise Prescription Changes:     Exercise Prescription Changes    Row Name 01/02/17 1500 01/17/17 1400 01/31/17 1500 02/14/17 1500       Response to Exercise   Blood Pressure (Admit) 134/70 126/72 118/60 116/62    Blood Pressure (Exercise) 144/70 132/70 110/60 148/82    Blood Pressure (Exit) 114/72 118/70 94/58 112/72    Heart Rate (Admit) 80 bpm 66 bpm 81 bpm 70 bpm    Heart Rate (Exercise) 104 bpm 84 bpm 95 bpm 90 bpm    Heart Rate (Exit) 93 bpm 70 bpm 81 bpm 74 bpm    Oxygen Saturation (Admit) 97 % 96 % 96 % 97 %    Oxygen Saturation (Exercise) 91 % 94 % 95 % 94 %    Oxygen Saturation (Exit) 97 % 97 % 97 % 94 %    Rating of Perceived Exertion (Exercise) '13 12 13 13    '$ Perceived Dyspnea (Exercise) '3 3 3 3    '$ Symptoms hip pain hip pain and shoulder pain hip pain and shoulder pain hip pain and shoulder pain    Comments Home Exercise Guidelines given 11/24/16 Home Exercise Guidelines given 11/24/16 Home Exercise Guidelines given 11/24/16 Home Exercise Guidelines given 11/24/16    Duration Progress to 45 minutes of aerobic exercise without signs/symptoms of physical distress Progress to 45 minutes of aerobic exercise without signs/symptoms of physical distress Progress to 45 minutes of aerobic exercise without signs/symptoms of physical distress Continue with 45 min of aerobic exercise without signs/symptoms of physical distress.    Intensity THRR unchanged THRR unchanged THRR unchanged THRR unchanged      Progression   Progression Continue to progress workloads to maintain intensity without  signs/symptoms of physical distress. Continue to progress workloads to maintain intensity without signs/symptoms of physical distress. Continue to progress workloads to maintain intensity without signs/symptoms of physical distress. Continue to progress workloads to maintain intensity without signs/symptoms of physical distress.    Average METs 2.05 2.39 2.26 2.34      Resistance Training   Training Prescription Yes Yes Yes Yes    Weight 3 lbs right and 2 lbs left red band 2 lbs 3 lbs right and 2 lbs left    Reps 10-12 10-15 10-15 10-15      Interval Training   Interval Training No No No No      Treadmill   MPH 1.3  modified for hip pain 1.4 1.4 1.6    Grade  0 0 0 0    Minutes '15 15 15 15    '$ METs 2 2.07 2.07 2.23      NuStep   Level '5 5 4 4    '$ Minutes '15 15 15 15    '$ METs 2.2 2.4 2.7 2.5      REL-XR   Level  - '2 2 5    '$ Watts  - -  44 spm  -  -    Minutes  - '15 15 15    '$ METs  - 2.7 2 2.3      T5 Nustep   Level 2  -  -  -    Minutes 15  -  -  -    METs 1.9  -  -  -      Home Exercise Plan   Plans to continue exercise at Home  walking Home  walking Home  walking Home (comment)  walking    Frequency Add 2 additional days to program exercise sessions. Add 2 additional days to program exercise sessions. Add 2 additional days to program exercise sessions. Add 2 additional days to program exercise sessions.    Initial Home Exercises Provided  -  -  - 11/24/16      Exercise Review   Progression Yes Yes Yes  -       Exercise Comments:     Exercise Comments    Row Name 01/02/17 1502 01/17/17 1427 01/31/17 1556 02/23/17 1201     Exercise Comments Buck has been doing well in exercise. He has had some issues with his hip, but is trying to do his exercise.  We will continue to monitor his progression. Thelmer continues to do well in exercise.  He is now also having some left sided shoulder problems along with his hip.  We will continue to monitor his progress. Geza is scheduled to  have surgery next week.  We will discuss having him graduate prior to surgery as he may need PT after surgery.  We will do his post walk test on Friday before he goes out. Sandra graduated today from cardiac rehab with 36 sessions completed.  Details of the patient's exercise prescription and what He needs to do in order to continue the prescription and progress were discussed with patient.  Patient was given a copy of prescription and goals.  Patient verbalized understanding.  Trapper plans to continue to exercise by walking in his neighborhood and after surgery he plans to join the [employer] with his wife.       Exercise Goals and Review:   Exercise Goals Re-Evaluation :     Exercise Goals Re-Evaluation    Row Name 02/14/17 1512             Exercise Goal Re-Evaluation   Exercise Goals Review Increase Physical Activity;Increase Strenth and Stamina       Comments Glendal wil be graduating at the end of next week.  He has done well in the program. He is now up to level 5 on the XR.       Expected Outcomes Short: Brevon will graduate [facility] Long: Mayson will continue to exercise on his own by walking at home.          Discharge Exercise Prescription (Final Exercise Prescription Changes):     Exercise Prescription Changes - 02/14/17 1500      Response to Exercise   Blood Pressure (Admit) 116/62   Blood Pressure (Exercise) 148/82   Blood Pressure (  Exit) 112/72   Heart Rate (Admit) 70 bpm   Heart Rate (Exercise) 90 bpm   Heart Rate (Exit) 74 bpm   Oxygen Saturation (Admit) 97 %   Oxygen Saturation (Exercise) 94 %   Oxygen Saturation (Exit) 94 %   Rating of Perceived Exertion (Exercise) 13   Perceived Dyspnea (Exercise) 3   Symptoms hip pain and shoulder pain   Comments Home Exercise Guidelines given 11/24/16   Duration Continue with 45 min of aerobic exercise without signs/symptoms of physical distress.   Intensity THRR unchanged     Progression   Progression Continue to progress workloads  to maintain intensity without signs/symptoms of physical distress.   Average METs 2.34     Resistance Training   Training Prescription Yes   Weight 3 lbs right and 2 lbs left   Reps 10-15     Interval Training   Interval Training No     Treadmill   MPH 1.6   Grade 0   Minutes 15   METs 2.23     NuStep   Level 4   Minutes 15   METs 2.5     REL-XR   Level 5   Minutes 15   METs 2.3     Home Exercise Plan   Plans to continue exercise at Home (comment)  walking   Frequency Add 2 additional days to program exercise sessions.   Initial Home Exercises Provided 11/24/16      Nutrition:  Target Goals: Understanding of nutrition guidelines, daily intake of sodium '1500mg'$ , cholesterol '200mg'$ , calories 30% from fat and 7% or less from saturated fats, daily to have 5 or more servings of fruits and vegetables.  Biometrics:    Nutrition Therapy Plan and Nutrition Goals:   Nutrition Discharge: Rate Your Plate Scores:   Nutrition Goals Re-Evaluation:   Nutrition Goals Discharge (Final Nutrition Goals Re-Evaluation):   Psychosocial: Target Goals: Acknowledge presence or absence of significant depression and/or stress, maximize coping skills, provide positive support system. Participant is able to verbalize types and ability to use techniques and skills needed for reducing stress and depression.   Initial Review & Psychosocial Screening:   Quality of Life Scores:     Quality of Life - 02/09/17 1138      Quality of Life Scores   Health/Function Pre 19.81 %   Health/Function Post 21.41 %   Health/Function % Change 8.08 %   Socioeconomic Pre 21 %   Socioeconomic Post 30 %   Socioeconomic % Change  42.86 %   Psych/Spiritual Pre 21 %   Psych/Spiritual Post 30 %   Psych/Spiritual % Change 42.86 %   Family Pre 21 %   Family Post 30 %   Family % Change 42.86 %   GLOBAL Pre 20.46 %   GLOBAL Post 25.96 %   GLOBAL % Change 26.88 %      PHQ-9: Recent Review  Flowsheet Data    Depression screen [facility] 2/9 02/09/2017 10/24/2016 12/03/2015   Decreased Interest 0 0 0   Down, Depressed, Hopeless 0 0 0   PHQ - 2 Score 0 0 0   Altered sleeping 2 1 -   Tired, decreased energy 1 2 -   Change in appetite 0 0 -   Feeling bad or failure about yourself  0 2  -   Trouble concentrating 0 1 -   Moving slowly or fidgety/restless 0 0 -   Suicidal thoughts 0 0 -   PHQ-9 Score 3 6 -  Difficult doing work/chores Not difficult at all Somewhat difficult -     Interpretation of Total Score  Total Score Depression Severity:  1-4 = Minimal depression, 5-9 = Mild depression, 10-14 = Moderate depression, 15-19 = Moderately severe depression, 20-27 = Severe depression   Psychosocial Evaluation and Intervention:     Psychosocial Evaluation - 02/12/17 1230      Discharge Psychosocial Assessment & Intervention   Comments Counselor follow up prior to discharge with Mr. Swab.  He reports he has accomplished all his goals since starting this program with increased energy; breathing a little better and loss of some weight (not as much as he would have liked!).  He also reports he has been sleeping better lately and this is some progress as well.  Mr. Brault [facility] following discharge from this program.  Counselor commended Mr. Seier on all his progress made.        Psychosocial Re-Evaluation:     Psychosocial Re-Evaluation    Row Name 01/12/17 1309             Psychosocial Re-Evaluation   Comments Tyrique had been doing well with rehab.  He does not have any barriers to coming to rehab.  He has noticed how much it is helping him.  He has a good support system in his wife and even she has noticed how much better his breathing is doing.  He has had some problems with sleeping while he was on prednisone, but it is getting better.  Overall his stress levels are good. We will continue to monitor.       Interventions  Encouraged to attend Pulmonary Rehabilitation for the exercise          Psychosocial Discharge (Final Psychosocial Re-Evaluation):     Psychosocial Re-Evaluation - 01/12/17 1309      Psychosocial Re-Evaluation   Comments Bradin had been doing well with rehab.  He does not have any barriers to coming to rehab.  He has noticed how much it is helping him.  He has a good support system in his wife and even she has noticed how much better his breathing is doing.  He has had some problems with sleeping while he was on prednisone, but it is getting better.  Overall his stress levels are good. We will continue to monitor.   Interventions Encouraged to attend Pulmonary Rehabilitation for the exercise      Education: Education Goals: Education classes will be provided on a weekly basis, covering required topics. Participant will state understanding/return demonstration of topics presented.  Learning Barriers/Preferences:   Education Topics: Initial Evaluation Education: - Verbal, written and demonstration of respiratory meds, RPE/PD scales, oximetry and breathing techniques. Instruction on use of nebulizers and MDIs: cleaning and proper use, rinsing mouth with steroid doses and importance of monitoring MDI activations.   General Nutrition Guidelines/Fats and Fiber: -Group instruction provided by verbal, written material, models and posters to present the general guidelines for heart healthy nutrition. Gives an explanation and review of dietary fats and fiber. Flowsheet Row Pulmonary Rehab from 02/21/2017 in [facility] Cardiac and Pulmonary Rehab  Date  02/19/17  Educator  CR  Instruction Review Code  2- meets goals/outcomes      Controlling Sodium/Reading Food Labels: -Group verbal and written material supporting the discussion of sodium use in heart healthy nutrition. Review and explanation with models, verbal and written materials for utilization of the food label. Flowsheet  Row Pulmonary Rehab  from 02/21/2017 in [facility] Cardiac and Pulmonary Rehab  Date  01/08/17  Educator  CR  Instruction Review Code  2- meets goals/outcomes      Exercise Physiology & Risk Factors: - Group verbal and written instruction with models to review the exercise physiology of the cardiovascular system and associated critical values. Details cardiovascular disease risk factors and the goals associated with each risk factor. Flowsheet Row Pulmonary Rehab from 02/21/2017 in [facility] Cardiac and Pulmonary Rehab  Date  12/20/16  Educator  [facility]  Instruction Review Code  2- meets goals/outcomes      Aerobic Exercise & Resistance Training: - Gives group verbal and written discussion on the health impact of inactivity. On the components of aerobic and resistive training programs and the benefits of this training and how to safely progress through these programs. Flowsheet Row Pulmonary Rehab from 02/21/2017 in [facility] Cardiac and Pulmonary Rehab  Date  01/17/17  Educator  [facility]  Instruction Review Code  2- meets goals/outcomes      Flexibility, Balance, General Exercise Guidelines: - Provides group verbal and written instruction on the benefits of flexibility and balance training programs. Provides general exercise guidelines with specific guidelines to those with heart or lung disease. Demonstration and skill practice provided. Flowsheet Row Pulmonary Rehab from 02/21/2017 in [facility] Cardiac and Pulmonary Rehab  Date  02/09/17  Educator  AS  Instruction Review Code  2- meets goals/outcomes      Stress Management: - Provides group verbal and written instruction about the health risks of elevated stress, cause of high stress, and healthy ways to reduce stress. Flowsheet Row Pulmonary Rehab from 02/21/2017 in [facility] Cardiac and Pulmonary Rehab  Date  12/27/16  Educator  [facility]  Instruction Review Code  2- meets goals/outcomes      Depression: - Provides group verbal and written instruction on the correlation between heart/lung  disease and depressed mood, treatment options, and the stigmas associated with seeking treatment. Flowsheet Row Pulmonary Rehab from 02/21/2017 in [facility] Cardiac and Pulmonary Rehab  Date  02/21/17  Educator  [facility]  Instruction Review Code  2- meets goals/outcomes      Exercise & Equipment Safety: - Individual verbal instruction and demonstration of equipment use and safety with use of the equipment. Flowsheet Row Pulmonary Rehab from 02/21/2017 in [facility] Cardiac and Pulmonary Rehab  Date  10/30/16  Educator  C. EnterkinRN  Instruction Review Code  2- meets goals/outcomes      Infection Prevention: - Provides verbal and written material to individual with discussion of infection control including proper hand washing and proper equipment cleaning during exercise session. Flowsheet Row Pulmonary Rehab from 02/21/2017 in [facility] Cardiac and Pulmonary Rehab  Date  10/30/16  Educator  C. EnterkinRN  Instruction Review Code  2- meets goals/outcomes      Falls Prevention: - Provides verbal and written material to individual with discussion of falls prevention and safety. Flowsheet Row Pulmonary Rehab from 02/21/2017 in [facility] Cardiac and Pulmonary Rehab  Date  10/30/16  Educator  C. Enterkin R.N.  Instruction Review Code  2- meets goals/outcomes      Diabetes: - Individual verbal and written instruction to review signs/symptoms of diabetes, desired ranges of glucose level fasting, after meals and with exercise. Advice that pre and post exercise glucose checks will be done for 3 sessions at entry of program.   Chronic Lung Diseases: - Group verbal and written instruction to review new updates, new respiratory medications, new advancements in procedures  and treatments. Provide informative websites and "800" numbers of self-education. Flowsheet Row Pulmonary Rehab from 02/21/2017 in [facility] Cardiac and Pulmonary Rehab  Date  01/10/17  Educator  LB  Instruction Review Code  2- meets goals/outcomes       Lung Procedures: - Group verbal and written instruction to describe testing methods done to diagnose lung disease. Review the outcome of test results. Describe the treatment choices: Pulmonary Function Tests, ABGs and oximetry.   Energy Conservation: - Provide group verbal and written instruction for methods to conserve energy, plan and organize activities. Instruct on pacing techniques, use of adaptive equipment and posture/positioning to relieve shortness of breath.   Triggers: - Group verbal and written instruction to review types of environmental controls: home humidity, furnaces, filters, dust mite/pet prevention, HEPA vacuums. To discuss weather changes, air quality and the benefits of nasal washing. Flowsheet Row Pulmonary Rehab from 02/21/2017 in [facility] Cardiac and Pulmonary Rehab  Date  02/07/17  Educator  LB  Instruction Review Code  2- meets goals/outcomes      Exacerbations: - Group verbal and written instruction to provide: warning signs, infection symptoms, calling MD promptly, preventive modes, and value of vaccinations. Review: effective airway clearance, coughing and/or vibration techniques. Create an Sports administrator. Flowsheet Row Pulmonary Rehab from 02/21/2017 in [facility] Cardiac and Pulmonary Rehab  Date  11/22/16  Educator  LB  Instruction Review Code  2- meets goals/outcomes      Oxygen: - Individual and group verbal and written instruction on oxygen therapy. Includes supplement oxygen, available portable oxygen systems, continuous and intermittent flow rates, oxygen safety, concentrators, and Medicare reimbursement for oxygen.   Respiratory Medications: - Group verbal and written instruction to review medications for lung disease. Drug class, frequency, complications, importance of spacers, rinsing mouth after steroid MDI's, and proper cleaning methods for nebulizers.   AED/CPR: - Group verbal and written instruction with the use of models to demonstrate the basic  use of the AED with the basic ABC's of resuscitation.   Breathing Retraining: - Provides individuals verbal and written instruction on purpose, frequency, and proper technique of diaphragmatic breathing and pursed-lipped breathing. Applies individual practice skills. Flowsheet Row Pulmonary Rehab from 02/21/2017 in [facility] Cardiac and Pulmonary Rehab  Date  10/30/16  Educator  Loletha Grayer ENterkinRN  Instruction Review Code  2- meets goals/outcomes      Anatomy and Physiology of the Lungs: - Group verbal and written instruction with the use of models to provide basic lung anatomy and physiology related to function, structure and complications of lung disease.   Heart Failure: - Group verbal and written instruction on the basics of heart failure: signs/symptoms, treatments, explanation of ejection fraction, enlarged heart and cardiomyopathy. Flowsheet Row Pulmonary Rehab from 02/21/2017 in [facility] Cardiac and Pulmonary Rehab  Date  02/02/17  Educator  CE  Instruction Review Code  2- meets goals/outcomes      Sleep Apnea: - Individual verbal and written instruction to review Obstructive Sleep Apnea. Review of risk factors, methods for diagnosing and types of masks and machines for OSA.   Anxiety: - Provides group, verbal and written instruction on the correlation between heart/lung disease and anxiety, treatment options, and management of anxiety. Flowsheet Row Pulmonary Rehab from 02/21/2017 in [facility] Cardiac and Pulmonary Rehab  Date  12/27/16  Educator  [facility]  Instruction Review Code  2- Meets goals/outcomes      Relaxation: - Provides group, verbal and written instruction about the benefits of relaxation for patients with heart/lung disease. Also  provides patients with examples of relaxation techniques. Flowsheet Row Pulmonary Rehab from 02/21/2017 in [facility] Cardiac and Pulmonary Rehab  Date  01/24/17  Educator  [facility]  Instruction Review Code  2- Meets goals/outcomes      Knowledge Questionnaire  Score:     Knowledge Questionnaire Score - 02/09/17 1136      Knowledge Questionnaire Score   Pre Score 10/10   Post Score 9/10       Core Components/Risk Factors/Patient Goals at Admission:   Core Components/Risk Factors/Patient Goals Review:      Goals and Risk Factor Review    Row Name 01/12/17 1244 02/09/17 1334 02/19/17 1506         Core Components/Risk Factors/Patient Goals Review   Personal Goals Review Weight Management/Obesity;Increase knowledge of respiratory medications and ability to use respiratory devices properly.;Increase Strength and Stamina;Sedentary;Lipids;Hypertension;Improve shortness of breath with ADL's;Develop more efficient breathing techniques such as purse lipped breathing and diaphragmatic breathing and practicing self-pacing with activity. Weight Management/Obesity;Increase knowledge of respiratory medications and ability to use respiratory devices properly.;Increase Strength and Stamina;Sedentary;Lipids;Hypertension;Improve shortness of breath with ADL's;Develop more efficient breathing techniques such as purse lipped breathing and diaphragmatic breathing and practicing self-pacing with activity. Weight Management/Obesity;Improve shortness of breath with ADL's;Increase knowledge of respiratory medications and ability to use respiratory devices properly.;Develop more efficient breathing techniques such as purse lipped breathing and diaphragmatic breathing and practicing self-pacing with activity.;Hypertension;Lipids     Review Khyre is doing well with exercise.   He is still having problems with his left hip.  He has not been doing his home exercise but is planning to add it in.  His weight has been trending down.  Maison has been doing well with his meds.  He is dong more at home and breathing better overall. He can now tie his shoes without getting SOB. Emran will be graduating soon.  He has been doing well with exercise despite his hip.  He is walking some at home  and hopes to continue until his surgery next month.  Overall, his weight has been headed down (up a little today at 203).  He has really noticed a difference in his ability to breathe and his strength and stamina at home.  His wife and friends have even commented on how well he is doing.  He likes using the pursed lip breathing to do stuff around the house.  His dysnpea scores have gone down greatly (22)!.   Mr Leaf will graduate in 2 sessions. Although he is limited by his hip for exercise goals progression, he has improved his 54md by 3397f  He has also improved his Shortness of Breath Questionnaire by 22 points with Minimal Importance Difference at 5 points. He has a good understanding of his Symbicort, uses the spacer, and Spiriva. Mr BrZuerchers also compliant with his blood pressure and lipid medications. By changing his eatting habits and eatting healthier foods, alone with the regular exercise, he has lost 11lbs.  He has good technique with his PLB. Mr BrMatneyas attended regularly and has enjoyed the education.     Expected Outcomes Short: Continue to exercise at rehab and add one day at home.  Long: Continue to improve his SOB. Short: Finish rehab and exercise until surgery.  Long: Continue to improve his SOB. Continue exercise after his surgery and continue self-management of his COPD [facility]       Core Components/Risk Factors/Patient Goals at Discharge (Final Review):  Goals and Risk Factor Review - 02/19/17 1506      Core Components/Risk Factors/Patient Goals Review   Personal Goals Review Weight Management/Obesity;Improve shortness of breath with ADL's;Increase knowledge of respiratory medications and ability to use respiratory devices properly.;Develop more efficient breathing techniques such as purse lipped breathing and diaphragmatic breathing and practicing self-pacing with activity.;Hypertension;Lipids   Review Mr Mahler will graduate in 2  sessions. Although he is limited by his hip for exercise goals progression, he has improved his 48md by 3381f  He has also improved his Shortness of Breath Questionnaire by 22 points with Minimal Importance Difference at 5 points. He has a good understanding of his Symbicort, uses the spacer, and Spiriva. Mr BrBernardss also compliant with his blood pressure and lipid medications. By changing his eatting habits and eatting healthier foods, alone with the regular exercise, he has lost 11lbs.  He has good technique with his PLB. Mr BrCullenas attended regularly and has enjoyed the education.   Expected Outcomes Continue exercise after his surgery and continue self-management of his COPD [facility]     ITP Comments:   Comments: 30 Day Review

## 2017-02-28 ENCOUNTER — Encounter (HOSPITAL_COMMUNITY)
Admission: RE | Admit: 2017-02-28 | Discharge: 2017-02-28 | Disposition: A | Discharge status: Home / Self Care | Payer: Medicare Other | Source: Ambulatory Visit | Attending: Neurosurgery | Admitting: Neurosurgery

## 2017-02-28 ENCOUNTER — Ambulatory Visit: Payer: Medicare Other

## 2017-02-28 ENCOUNTER — Encounter (HOSPITAL_COMMUNITY): Payer: Self-pay

## 2017-02-28 ENCOUNTER — Other Ambulatory Visit: Payer: Self-pay

## 2017-02-28 DIAGNOSIS — M4712 Other spondylosis with myelopathy, cervical region: Secondary | ICD-10-CM | POA: Insufficient documentation

## 2017-02-28 DIAGNOSIS — Z01818 Encounter for other preprocedural examination: Secondary | ICD-10-CM | POA: Diagnosis not present

## 2017-02-28 HISTORY — DX: Unspecified cataract: H26.9

## 2017-02-28 HISTORY — DX: Weakness: R53.1

## 2017-02-28 HISTORY — DX: Dyspnea, unspecified: R06.00

## 2017-02-28 HISTORY — DX: Chronic obstructive pulmonary disease, unspecified: J44.9

## 2017-02-28 HISTORY — DX: Pain in unspecified joint: M25.50

## 2017-02-28 HISTORY — DX: Microscopic colitis, unspecified: K52.839

## 2017-02-28 HISTORY — DX: Personal history of other diseases of the musculoskeletal system and connective tissue: Z87.39

## 2017-02-28 HISTORY — DX: Unspecified osteoarthritis, unspecified site: M19.90

## 2017-02-28 HISTORY — DX: Benign prostatic hyperplasia without lower urinary tract symptoms: N40.0

## 2017-02-28 LAB — BASIC METABOLIC PANEL
Anion gap: 6 (ref 5–15)
BUN: 14 mg/dL (ref 6–20)
CO2: 30 mmol/L (ref 22–32)
Calcium: 9 mg/dL (ref 8.9–10.3)
Chloride: 101 mmol/L (ref 101–111)
Creatinine, Ser: 1.14 mg/dL (ref 0.61–1.24)
GFR calc Af Amer: >60 mL/min (ref >60)
GFR calc non Af Amer: 57 mL/min — ABNORMAL LOW (ref >60)
Glucose, Bld: 141 mg/dL — ABNORMAL HIGH (ref 65–99)
Potassium: 4.6 mmol/L (ref 3.5–5.1)
Sodium: 137 mmol/L (ref 135–145)

## 2017-02-28 LAB — CBC
HCT: 44.8 % (ref 39.0–52.0)
Hemoglobin: 14.4 g/dL (ref 13.0–17.0)
MCH: 28.9 pg (ref 26.0–34.0)
MCHC: 32.1 g/dL (ref 30.0–36.0)
MCV: 90 fL (ref 78.0–100.0)
Platelets: 160 10*3/uL (ref 150–400)
RBC: 4.98 MIL/uL (ref 4.22–5.81)
RDW: 14.1 % (ref 11.5–15.5)
WBC: 5.9 10*3/uL (ref 4.0–10.5)

## 2017-02-28 LAB — TYPE AND SCREEN
ABO/RH(D): O NEG
Antibody Screen: NEGATIVE

## 2017-02-28 LAB — SURGICAL PCR SCREEN
MRSA, PCR: NEGATIVE
Staphylococcus aureus: NEGATIVE

## 2017-02-28 LAB — ABO/RH: ABO/RH(D): O NEG

## 2017-02-28 MED ORDER — CHLORHEXIDINE GLUCONATE CLOTH 2 % EX PADS: 6.0000 | MEDICATED_PAD | Freq: Once | CUTANEOUS | Status: DC

## 2017-02-28 NOTE — Progress Notes (Addendum)
Anesthesia PAT Evaluation: Patient is a 83 year old [name] scheduled for C3-4, C4-5 ACDF on 03/07/17 by Dr. Arnoldo Morale.  History includes former smoker, CAD s/p CABG (LIMA-LAD, SVG-D1, SVG-RAM INT, SVG-OM1, SVG-PDA) 03/17/10 (by op note, patient felt to be a poor redo CABG candidate), GERD, HLD, TIA, prostate cancer s/p radioactive seed implant 01/2012, HTN, COPD, exertional dyspnea, microscopic colitis, left THA '97, appendectomy. He had ventricular runs versus PAF on 2017 event monitor.   - PCP is Dr. Lelon Huh. - Cardiologist is Dr. Bartholome Bill, last visit 01/31/17. He felt patient would be at moderate risk from a cardiac standpoint. Today's EKG faxed to Dr. Ubaldo Glassing, and I also spoke with him regarding patient's heart rate of 33 and follow-up EKG (HR 64, PVCs). He agrees that patient was likely in ventricular bigeminy at the time of his vitals (thus reading of HR 33). In looking back at patient's records, he said that patient has had a known history of this. Patient has been asymptomatic. Continue b-blocker therapy, but if no new symptoms nothing further recommended preoperatively. Patient denied CP, SOB at rest, any significant edema, dizziness, syncope.  - Pulmonologist is Dr. Laverle Hobby, last visit 09/28/16 for COPD. Patient also with known elevated right diaphragm. He prescribed pulmonary rehab due to deconditioning and albuterol with exercise. Patient just completed Pulmonary Re[facility] with [facility] Patient has lost 10 lb through diet and exercise. He does not require home oxygen. He denied SOB at rest, and has stable DOE.   Meds include albuterol, ASA 325 mg (on hold), Liptior, Optivar ophthalmic, benazepril-HCTZ, Symbicort, Flonase, Lasix, Prevacid, Ativan, magnesium, metoprolol 50 mg twice a day, Singulair, Omega 3 (on hold), Spiriva, Flomax, Zanaflex.  BP (!) 125/45   Pulse (!) 33 Comment: notified Kristi RN  Temp 36.4 C   Resp 20   Ht 5' 8.5" (1.74 m)   Wt 200 lb 3.2 oz  (90.8 kg)   SpO2 95%   BMI 30.00 kg/m   Heart overall RRR, occasional ectopic beat. Lungs clear. Trace pre-tibial edema. I did not note any carotid bruits.    EKG 02/28/17: SR at 64 bpm, occasional PVCs, LAD, left BBB. PVCs are new, but otherwise I think tracing is similar to 01/31/17 from Dr. Bethanne Ginger office.   48 Hour Holter Monitor 09/28/16 Jefm Bryant; [facility]; Care Everywhere): Holter Results Hours recorded: 48 hour Total Beats: 90,562 beats Heart Rate:  Average: 67 bpm Minimum: 38 bpm at 4:43 AM Maximum: 114 bpm at 4:09 PM Pauses > 2 seconds: 0 Longest RR interval: 1.9 sec at 5 : 19: A.m. Ventricular Ectopy:Supraventricular Ectopy Total: 4218 Total: 49 Isolated: 4049 Isolated: 49 Couplets: 80 Pairs: 0 Runs: 1 Runs: 0 Longest: 9 beats at 2:11 AMLongest: N/A Fastest: 165 bpm at 2:11 AMFastest: N/A Summary: 1.Predominant rhythm was sinus rhythm.He had 1 9 beat run of wide-complex tachycardia with irregularity.Ventricular runs versus apparent atrial fibrillation.There were no prolonged periods of A. Fib.  Nuclear stress test 09/11/16 Jefm Bryant, [facility]; Care Everywhere): Result Impression: Perfusion Analysis:SPECT images demonstrate large perfusion abnormality  of moderate intensity is present in the inferolateral region on the stress  images.Borderline reversibility. Reduced left ventricular function EF 37%.Evidence of inferolateral  infarct with borderline peri-infarct ischemia. (Dr. Ubaldo Glassing reviewed and felt study revealed no significant change from his 2015 study that was followed by cardiac catheterization which showed no progression of disease. Patient's symptoms were stable. Medical therapy recommended.)  Echo 09/11/16 Jefm Bryant, [facility]; Care Everywhere): INTERPRETATION MODERATE LV SYSTOLIC DYSFUNCTION (hypocontractile: Anterior, lateral, septal,  apical, inferior, posterior) MILD RV SYSTOLIC DYSFUNCTION (See above-->documented as normal contraction) MILD VALVULAR  REGURGITATION (trivial AR, trivial MR, trivial TR) NO VALVULAR STENOSIS Contraction: MOD GLOBAL DECREASE LVH: MILD LVH Size: MILDLY ENLARGED Closest EF: 40% (Estimated) Aortic: TRIVIAL AR Tricuspid: TRIVIAL TR Mitral: TRIVIAL MR  Cardiac cath 12/02/14 [facility]):  Native: 100% mid LAD. 99% ostial circumflex. 100% proximal circumflex. 100% proximal RCA. Grafts:  Graft to the LAD: The graft was a very small size LIMA. Graft angiography showed severe atherosclerosis. Graft to the first diagonal: The graft was a normal-size saphenous vein graft from aorta. Graft angiography showed minor luminal irregularities. Graft to the OM1: The graft was a normal-size sequential vein graft from the first diagonal. Graft angiography showed minor luminal irregularities. Graft to the distal RCA: The graft was a saphenous vein graft from aorta. Graft to the RPDA: The graft was a normal-size saphenous vein graft from aorta. Angiography showed minor luminal irregularities. Impression: There is significant triple-vessel coronary artery disease.  (Per 12/08/14 note by Dr. Ubaldo Glassing, "Recently underwent leftcardiac catheterization revealing no significant progression of disease. He was felt to be a candidate for medical management.")  Spirometry 09/28/16: FVC 1.70 (48%), FEV1 1.19 (48%), FEV1/FVC 70% (99%), FEF 25-75% 0.70 (44%). Severe restriction.  Preoperative labs noted.   If no acute changes then I anticipate that he can proceed as planned.  George Hugh [facility] Short Stay Center/Anesthesiology Phone [phone] 02/28/2017 4:37 PM

## 2017-02-28 NOTE — Progress Notes (Addendum)
Cardiologist is Dr.Fath with last visit in chart  Medical Md is Dr.Donald Caryn Section   Echo report in epic from 09/11/16  Stress test report in epic from 09/11/16  Heart cath report in epic from 12/02/14  EKG in chart from 01-31-17  CXR in epic from 05-22-16

## 2017-02-28 NOTE — Progress Notes (Signed)
This pt has screened at an elevated risk for Obstructive Sleep Apnea using the STOP-Bang tool during a presurgical visit.A score of 4 or greater is an elevated risk.

## 2017-02-28 NOTE — Pre-Procedure Instructions (Signed)
James Santellan Heilman Sr.  02/28/2017      [facility] (MAIL ORDER) [facility] - [place], [place] - [place] [address] Phone: [phone] Fax: [phone]  [place], [place] - [address] [place] [address] Phone: [phone] Fax: [phone]  [facility] filled by [place], [address] [place] [address] Phone: [phone] Fax: [phone]    Your procedure is scheduled on Wed, Mar 21 @ 11:15 AM  Report to [facility] at 9:15 AM  Call this number if you have problems the morning of surgery:  [phone]   Remember:  Do not eat food or drink liquids after midnight.  Take these medicines the morning of surgery with A SIP OF WATER Albuterol<Bring Your Inhaler With You>,Eye Drops,Symbicort,Flonase(Fluticasone),Prevacid(Lansoprazole),Metoprolol(Lopressor),Singulair(Montelukast),and Spiriva.                Stop taking your Aspirin along with any Vitamins or Herbal Medications.No Goody's,BC's,Aleve,Advil,Motrin,or Fish Oil.   Do not wear jewelry.  Do not wear lotions, powders,colognes, or deoderant.  Men may shave face and neck.  Do not bring valuables to the hospital.  [facility] is not responsible for any belongings or valuables.  Contacts, dentures or bridgework may not be worn into surgery.  Leave your suitcase in the car.  After surgery it may be brought to your room.  For patients admitted to the hospital, discharge time will be determined by your treatment team.  Patients discharged the day of surgery will not be allowed to drive home.    [facility]  Before surgery, you can play an important role.  Because skin is not sterile, your skin needs to be as free of germs as possible.  You can reduce the number of germs on you skin by washing with CHG (chlorahexidine  gluconate) soap before surgery.  CHG is an antiseptic cleaner which kills germs and bonds with the skin to continue killing germs even after washing.  Please DO NOT use if you have an allergy to CHG or antibacterial soaps.  If your skin becomes reddened/irritated stop using the CHG and inform your nurse when you arrive at Short Stay.  Do not shave (including legs and underarms) for at least 48 hours prior to the first CHG shower.  You may shave your face.  Please follow these instructions carefully:   1.  Shower with CHG Soap the night before surgery and the                                morning of Surgery.  2.  If you choose to wash your hair, wash your hair first as usual with your       normal shampoo.  3.  After you shampoo, rinse your hair and body thoroughly to remove the                      Shampoo.  4.  Use CHG as you would any other liquid soap.  You can apply chg directly       to the skin and wash gently with scrungie or a clean washcloth.  5.  Apply the CHG Soap to your body ONLY FROM THE NECK DOWN.        Do not use on  open wounds or open sores.  Avoid contact with your eyes,       ears, mouth and genitals (private parts).  Wash genitals (private parts)       with your normal soap.  6.  Wash thoroughly, paying special attention to the area where your surgery        will be performed.  7.  Thoroughly rinse your body with warm water from the neck down.  8.  DO NOT shower/wash with your normal soap after using and rinsing off       the CHG Soap.  9.  Pat yourself dry with a clean towel.            10.  Wear clean pajamas.            11.  Place clean sheets on your bed the night of your first shower and do not        sleep with pets.  Day of Surgery  Do not apply any lotions/deoderants the morning of surgery.  Please wear clean clothes to the hospital/surgery center.   Please read over the following fact sheets that you were given. Pain Booklet, Coughing and Deep Breathing, MRSA  Information and Surgical Site Infection Prevention

## 2017-03-02 ENCOUNTER — Ambulatory Visit: Payer: Medicare Other

## 2017-03-05 ENCOUNTER — Ambulatory Visit: Payer: Medicare Other

## 2017-03-07 ENCOUNTER — Ambulatory Visit (HOSPITAL_COMMUNITY): Payer: Medicare Other

## 2017-03-07 ENCOUNTER — Encounter (HOSPITAL_COMMUNITY): Payer: Self-pay

## 2017-03-07 ENCOUNTER — Encounter (HOSPITAL_COMMUNITY)
Admission: RE | Disposition: A | Discharge status: Home / Self Care | Payer: Self-pay | Source: Ambulatory Visit | Attending: Neurosurgery

## 2017-03-07 ENCOUNTER — Ambulatory Visit (HOSPITAL_COMMUNITY): Payer: Medicare Other | Admitting: Certified Registered Nurse Anesthetist

## 2017-03-07 ENCOUNTER — Ambulatory Visit (HOSPITAL_COMMUNITY)
Admission: RE | Admit: 2017-03-07 | Discharge: 2017-03-08 | Disposition: A | Discharge status: Home / Self Care | Payer: Medicare Other | Source: Ambulatory Visit | Attending: Neurosurgery | Admitting: Neurosurgery

## 2017-03-07 ENCOUNTER — Ambulatory Visit (HOSPITAL_COMMUNITY): Payer: Medicare Other | Admitting: Vascular Surgery

## 2017-03-07 DIAGNOSIS — Z951 Presence of aortocoronary bypass graft: Secondary | ICD-10-CM | POA: Diagnosis not present

## 2017-03-07 DIAGNOSIS — M4802 Spinal stenosis, cervical region: Secondary | ICD-10-CM | POA: Diagnosis not present

## 2017-03-07 DIAGNOSIS — J449 Chronic obstructive pulmonary disease, unspecified: Secondary | ICD-10-CM | POA: Insufficient documentation

## 2017-03-07 DIAGNOSIS — Z7951 Long term (current) use of inhaled steroids: Secondary | ICD-10-CM | POA: Insufficient documentation

## 2017-03-07 DIAGNOSIS — Z79899 Other long term (current) drug therapy: Secondary | ICD-10-CM | POA: Diagnosis not present

## 2017-03-07 DIAGNOSIS — K219 Gastro-esophageal reflux disease without esophagitis: Secondary | ICD-10-CM | POA: Insufficient documentation

## 2017-03-07 DIAGNOSIS — I2510 Atherosclerotic heart disease of native coronary artery without angina pectoris: Secondary | ICD-10-CM | POA: Insufficient documentation

## 2017-03-07 DIAGNOSIS — I10 Essential (primary) hypertension: Secondary | ICD-10-CM | POA: Insufficient documentation

## 2017-03-07 DIAGNOSIS — Z419 Encounter for procedure for purposes other than remedying health state, unspecified: Secondary | ICD-10-CM

## 2017-03-07 DIAGNOSIS — N400 Enlarged prostate without lower urinary tract symptoms: Secondary | ICD-10-CM | POA: Insufficient documentation

## 2017-03-07 DIAGNOSIS — Z981 Arthrodesis status: Secondary | ICD-10-CM | POA: Diagnosis not present

## 2017-03-07 DIAGNOSIS — E785 Hyperlipidemia, unspecified: Secondary | ICD-10-CM | POA: Diagnosis not present

## 2017-03-07 DIAGNOSIS — Z7982 Long term (current) use of aspirin: Secondary | ICD-10-CM | POA: Diagnosis not present

## 2017-03-07 DIAGNOSIS — M4712 Other spondylosis with myelopathy, cervical region: Secondary | ICD-10-CM | POA: Diagnosis not present

## 2017-03-07 DIAGNOSIS — Z96642 Presence of left artificial hip joint: Secondary | ICD-10-CM | POA: Insufficient documentation

## 2017-03-07 DIAGNOSIS — M4722 Other spondylosis with radiculopathy, cervical region: Secondary | ICD-10-CM | POA: Insufficient documentation

## 2017-03-07 DIAGNOSIS — Z87891 Personal history of nicotine dependence: Secondary | ICD-10-CM | POA: Diagnosis not present

## 2017-03-07 DIAGNOSIS — Z8673 Personal history of transient ischemic attack (TIA), and cerebral infarction without residual deficits: Secondary | ICD-10-CM | POA: Insufficient documentation

## 2017-03-07 DIAGNOSIS — M542 Cervicalgia: Secondary | ICD-10-CM | POA: Diagnosis present

## 2017-03-07 HISTORY — PX: ANTERIOR CERVICAL DECOMP/DISCECTOMY FUSION: SHX1161

## 2017-03-07 SURGERY — ANTERIOR CERVICAL DECOMPRESSION/DISCECTOMY FUSION 2 LEVELS
Anesthesia: General | Site: Spine Cervical

## 2017-03-07 MED ORDER — PANTOPRAZOLE SODIUM 40 MG PO TBEC
40.0000 mg | DELAYED_RELEASE_TABLET | Freq: Every day | ORAL | Status: DC
  Administered 2017-03-08: 40 mg via ORAL
  Filled 2017-03-07: qty 1

## 2017-03-07 MED ORDER — ROCURONIUM BROMIDE 50 MG/5ML IV SOSY
PREFILLED_SYRINGE | INTRAVENOUS | Status: AC
  Filled 2017-03-07: qty 5

## 2017-03-07 MED ORDER — PROSIGHT PO TABS
1.0000 | ORAL_TABLET | Freq: Every day | ORAL | Status: DC
  Administered 2017-03-07 – 2017-03-08 (×2): 1 via ORAL
  Filled 2017-03-07 (×3): qty 1

## 2017-03-07 MED ORDER — BENAZEPRIL HCL 20 MG PO TABS
20.0000 mg | ORAL_TABLET | Freq: Every day | ORAL | Status: DC
  Administered 2017-03-08: 20 mg via ORAL
  Filled 2017-03-07 (×2): qty 1

## 2017-03-07 MED ORDER — FENTANYL CITRATE (PF) 100 MCG/2ML IJ SOLN
INTRAMUSCULAR | Status: AC
  Filled 2017-03-07: qty 2

## 2017-03-07 MED ORDER — LACTATED RINGERS IV SOLN
INTRAVENOUS | Status: DC
  Administered 2017-03-07: 19:00:00 via INTRAVENOUS

## 2017-03-07 MED ORDER — SUCCINYLCHOLINE CHLORIDE 200 MG/10ML IV SOSY
PREFILLED_SYRINGE | INTRAVENOUS | Status: AC
  Filled 2017-03-07: qty 10

## 2017-03-07 MED ORDER — THROMBIN 5000 UNITS EX SOLR
CUTANEOUS | Status: DC | PRN
  Administered 2017-03-07 (×2): 5000 [IU] via TOPICAL

## 2017-03-07 MED ORDER — EPHEDRINE 5 MG/ML INJ
INTRAVENOUS | Status: AC
  Filled 2017-03-07: qty 10

## 2017-03-07 MED ORDER — PHENOL 1.4 % MT LIQD: 1.0000 | OROMUCOSAL | Status: DC | PRN

## 2017-03-07 MED ORDER — SUGAMMADEX SODIUM 200 MG/2ML IV SOLN
INTRAVENOUS | Status: DC | PRN
  Administered 2017-03-07: 180 mg via INTRAVENOUS

## 2017-03-07 MED ORDER — DOCUSATE SODIUM 100 MG PO CAPS
100.0000 mg | ORAL_CAPSULE | Freq: Two times a day (BID) | ORAL | Status: DC
  Administered 2017-03-07 – 2017-03-08 (×2): 100 mg via ORAL
  Filled 2017-03-07 (×2): qty 1

## 2017-03-07 MED ORDER — HYDROMORPHONE HCL 1 MG/ML IJ SOLN: 0.2500 mg | INTRAMUSCULAR | Status: DC | PRN

## 2017-03-07 MED ORDER — ATORVASTATIN CALCIUM 80 MG PO TABS
80.0000 mg | ORAL_TABLET | Freq: Every day | ORAL | Status: DC
  Administered 2017-03-07 – 2017-03-08 (×2): 80 mg via ORAL
  Filled 2017-03-07 (×2): qty 1

## 2017-03-07 MED ORDER — HEMOSTATIC AGENTS (NO CHARGE) OPTIME
TOPICAL | Status: DC | PRN
  Administered 2017-03-07: 1 via TOPICAL

## 2017-03-07 MED ORDER — LIDOCAINE 2% (20 MG/ML) 5 ML SYRINGE
INTRAMUSCULAR | Status: DC | PRN
  Administered 2017-03-07: 20 mg via INTRAVENOUS
  Administered 2017-03-07: 80 mg via INTRAVENOUS

## 2017-03-07 MED ORDER — TAMSULOSIN HCL 0.4 MG PO CAPS
0.4000 mg | ORAL_CAPSULE | Freq: Every day | ORAL | Status: DC
  Administered 2017-03-08: 0.4 mg via ORAL
  Filled 2017-03-07: qty 1

## 2017-03-07 MED ORDER — DEXAMETHASONE SODIUM PHOSPHATE 4 MG/ML IJ SOLN: 4.0000 mg | Freq: Four times a day (QID) | INTRAMUSCULAR | Status: AC

## 2017-03-07 MED ORDER — EPHEDRINE SULFATE-NACL 50-0.9 MG/10ML-% IV SOSY
PREFILLED_SYRINGE | INTRAVENOUS | Status: DC | PRN
  Administered 2017-03-07: 10 mg via INTRAVENOUS
  Administered 2017-03-07: 5 mg via INTRAVENOUS

## 2017-03-07 MED ORDER — MULTIPLE VITAMIN PO TABS: ORAL_TABLET | Freq: Every day | ORAL | Status: DC

## 2017-03-07 MED ORDER — PROPOFOL 10 MG/ML IV BOLUS
INTRAVENOUS | Status: AC
  Filled 2017-03-07: qty 20

## 2017-03-07 MED ORDER — TIOTROPIUM BROMIDE MONOHYDRATE 18 MCG IN CAPS
18.0000 ug | ORAL_CAPSULE | Freq: Every day | RESPIRATORY_TRACT | Status: DC
  Administered 2017-03-08: 18 ug via RESPIRATORY_TRACT
  Filled 2017-03-07: qty 5

## 2017-03-07 MED ORDER — ONDANSETRON HCL 4 MG/2ML IJ SOLN
INTRAMUSCULAR | Status: DC | PRN
  Administered 2017-03-07: 4 mg via INTRAVENOUS

## 2017-03-07 MED ORDER — BUPIVACAINE-EPINEPHRINE (PF) 0.5% -1:200000 IJ SOLN
INTRAMUSCULAR | Status: DC | PRN
Start: 2017-03-07 — End: 2017-03-07
  Administered 2017-03-07: 10 mL

## 2017-03-07 MED ORDER — FLUTICASONE PROPIONATE 50 MCG/ACT NA SUSP
2.0000 | Freq: Every day | NASAL | Status: DC
  Administered 2017-03-07 – 2017-03-08 (×2): 2 via NASAL
  Filled 2017-03-07: qty 16

## 2017-03-07 MED ORDER — METOPROLOL TARTRATE 50 MG PO TABS
50.0000 mg | ORAL_TABLET | Freq: Two times a day (BID) | ORAL | Status: DC
  Administered 2017-03-07 – 2017-03-08 (×2): 50 mg via ORAL
  Filled 2017-03-07 (×2): qty 1

## 2017-03-07 MED ORDER — CEFAZOLIN SODIUM-DEXTROSE 2-4 GM/100ML-% IV SOLN
2.0000 g | INTRAVENOUS | Status: AC
  Administered 2017-03-07: 2 g via INTRAVENOUS

## 2017-03-07 MED ORDER — PHENYLEPHRINE HCL 10 MG/ML IJ SOLN
INTRAVENOUS | Status: DC | PRN
  Administered 2017-03-07: 25 ug/min via INTRAVENOUS

## 2017-03-07 MED ORDER — DEXAMETHASONE SODIUM PHOSPHATE 10 MG/ML IJ SOLN
INTRAMUSCULAR | Status: DC | PRN
  Administered 2017-03-07: 10 mg via INTRAVENOUS

## 2017-03-07 MED ORDER — MENTHOL 3 MG MT LOZG
1.0000 | LOZENGE | OROMUCOSAL | Status: DC | PRN
  Administered 2017-03-07: 3 mg via ORAL
  Filled 2017-03-07: qty 9

## 2017-03-07 MED ORDER — LIDOCAINE 2% (20 MG/ML) 5 ML SYRINGE
INTRAMUSCULAR | Status: AC
  Filled 2017-03-07: qty 5

## 2017-03-07 MED ORDER — CEFAZOLIN SODIUM-DEXTROSE 2-4 GM/100ML-% IV SOLN
INTRAVENOUS | Status: AC
  Filled 2017-03-07: qty 100

## 2017-03-07 MED ORDER — OXYCODONE HCL 5 MG PO TABS
5.0000 mg | ORAL_TABLET | ORAL | Status: DC | PRN
  Administered 2017-03-07 – 2017-03-08 (×3): 10 mg via ORAL
  Administered 2017-03-08: 5 mg via ORAL
  Filled 2017-03-07: qty 1
  Filled 2017-03-07 (×3): qty 2

## 2017-03-07 MED ORDER — THROMBIN 5000 UNITS EX SOLR
CUTANEOUS | Status: AC
  Filled 2017-03-07: qty 15000

## 2017-03-07 MED ORDER — SUGAMMADEX SODIUM 200 MG/2ML IV SOLN
INTRAVENOUS | Status: AC
  Filled 2017-03-07: qty 2

## 2017-03-07 MED ORDER — MORPHINE SULFATE (PF) 2 MG/ML IV SOLN: 2.0000 mg | INTRAVENOUS | Status: DC | PRN

## 2017-03-07 MED ORDER — HYDROCHLOROTHIAZIDE 12.5 MG PO CAPS
12.5000 mg | ORAL_CAPSULE | Freq: Every day | ORAL | Status: DC
  Administered 2017-03-08: 12.5 mg via ORAL
  Filled 2017-03-07 (×2): qty 1

## 2017-03-07 MED ORDER — ALBUTEROL SULFATE (2.5 MG/3ML) 0.083% IN NEBU: 2.5000 mg | INHALATION_SOLUTION | Freq: Four times a day (QID) | RESPIRATORY_TRACT | Status: DC | PRN

## 2017-03-07 MED ORDER — CEFAZOLIN SODIUM-DEXTROSE 2-4 GM/100ML-% IV SOLN
2.0000 g | Freq: Three times a day (TID) | INTRAVENOUS | Status: AC
  Administered 2017-03-07 – 2017-03-08 (×2): 2 g via INTRAVENOUS
  Filled 2017-03-07 (×2): qty 100

## 2017-03-07 MED ORDER — BACITRACIN ZINC 500 UNIT/GM EX OINT
TOPICAL_OINTMENT | CUTANEOUS | Status: DC | PRN
  Administered 2017-03-07: 1 via TOPICAL

## 2017-03-07 MED ORDER — FENTANYL CITRATE (PF) 100 MCG/2ML IJ SOLN
INTRAMUSCULAR | Status: DC | PRN
Start: 2017-03-07 — End: 2017-03-07
  Administered 2017-03-07: 75 ug via INTRAVENOUS
  Administered 2017-03-07: 25 ug via INTRAVENOUS
  Administered 2017-03-07: 50 ug via INTRAVENOUS

## 2017-03-07 MED ORDER — ONDANSETRON HCL 4 MG PO TABS: 4.0000 mg | ORAL_TABLET | Freq: Four times a day (QID) | ORAL | Status: DC | PRN

## 2017-03-07 MED ORDER — MOMETASONE FURO-FORMOTEROL FUM 200-5 MCG/ACT IN AERO
2.0000 | INHALATION_SPRAY | Freq: Two times a day (BID) | RESPIRATORY_TRACT | Status: DC
  Administered 2017-03-07 – 2017-03-08 (×2): 2 via RESPIRATORY_TRACT
  Filled 2017-03-07: qty 8.8

## 2017-03-07 MED ORDER — DEXAMETHASONE 4 MG PO TABS
4.0000 mg | ORAL_TABLET | Freq: Four times a day (QID) | ORAL | Status: AC
  Administered 2017-03-07 – 2017-03-08 (×3): 4 mg via ORAL
  Filled 2017-03-07 (×3): qty 1

## 2017-03-07 MED ORDER — DEXAMETHASONE SODIUM PHOSPHATE 10 MG/ML IJ SOLN
INTRAMUSCULAR | Status: AC
  Filled 2017-03-07: qty 1

## 2017-03-07 MED ORDER — SODIUM CHLORIDE 0.9 % IR SOLN
Status: DC | PRN
  Administered 2017-03-07: 12:00:00

## 2017-03-07 MED ORDER — ROCURONIUM BROMIDE 100 MG/10ML IV SOLN
INTRAVENOUS | Status: DC | PRN
  Administered 2017-03-07: 50 mg via INTRAVENOUS

## 2017-03-07 MED ORDER — ONDANSETRON HCL 4 MG/2ML IJ SOLN: 4.0000 mg | Freq: Four times a day (QID) | INTRAMUSCULAR | Status: DC | PRN

## 2017-03-07 MED ORDER — THROMBIN 5000 UNITS EX SOLR
OROMUCOSAL | Status: DC | PRN
  Administered 2017-03-07: 12:00:00 via TOPICAL

## 2017-03-07 MED ORDER — ALBUTEROL SULFATE HFA 108 (90 BASE) MCG/ACT IN AERS: 2.0000 | INHALATION_SPRAY | Freq: Four times a day (QID) | RESPIRATORY_TRACT | Status: DC | PRN

## 2017-03-07 MED ORDER — BENAZEPRIL-HYDROCHLOROTHIAZIDE 20-12.5 MG PO TABS: 1.0000 | ORAL_TABLET | Freq: Every day | ORAL | Status: DC

## 2017-03-07 MED ORDER — ACETAMINOPHEN 650 MG RE SUPP
650.0000 mg | RECTAL | Status: DC | PRN
Start: 2017-03-07 — End: 2017-03-08

## 2017-03-07 MED ORDER — KETOTIFEN FUMARATE 0.025 % OP SOLN
1.0000 [drp] | Freq: Two times a day (BID) | OPHTHALMIC | Status: DC
  Administered 2017-03-07: 1 [drp] via OPHTHALMIC
  Filled 2017-03-07: qty 5

## 2017-03-07 MED ORDER — BACITRACIN ZINC 500 UNIT/GM EX OINT
TOPICAL_OINTMENT | CUTANEOUS | Status: AC
  Filled 2017-03-07: qty 28.35

## 2017-03-07 MED ORDER — LACTATED RINGERS IV SOLN
INTRAVENOUS | Status: DC | PRN
  Administered 2017-03-07 (×2): via INTRAVENOUS

## 2017-03-07 MED ORDER — FENTANYL CITRATE (PF) 100 MCG/2ML IJ SOLN: 25.0000 ug | INTRAMUSCULAR | Status: DC | PRN

## 2017-03-07 MED ORDER — ONDANSETRON HCL 4 MG/2ML IJ SOLN
INTRAMUSCULAR | Status: AC
  Filled 2017-03-07: qty 2

## 2017-03-07 MED ORDER — PROPOFOL 10 MG/ML IV BOLUS
INTRAVENOUS | Status: DC | PRN
  Administered 2017-03-07: 100 mg via INTRAVENOUS

## 2017-03-07 MED ORDER — MONTELUKAST SODIUM 10 MG PO TABS
10.0000 mg | ORAL_TABLET | Freq: Every day | ORAL | Status: DC
  Administered 2017-03-07: 10 mg via ORAL
  Filled 2017-03-07: qty 1

## 2017-03-07 MED ORDER — MAGNESIUM OXIDE 400 (241.3 MG) MG PO TABS
400.0000 mg | ORAL_TABLET | Freq: Every day | ORAL | Status: DC
  Administered 2017-03-08: 400 mg via ORAL
  Filled 2017-03-07 (×2): qty 1

## 2017-03-07 MED ORDER — BUPIVACAINE-EPINEPHRINE (PF) 0.5% -1:200000 IJ SOLN
INTRAMUSCULAR | Status: AC
  Filled 2017-03-07: qty 30

## 2017-03-07 MED ORDER — 0.9 % SODIUM CHLORIDE (POUR BTL) OPTIME
TOPICAL | Status: DC | PRN
  Administered 2017-03-07: 1000 mL

## 2017-03-07 MED ORDER — MAGNESIUM 400 MG PO CAPS: 1.0000 | ORAL_CAPSULE | Freq: Every day | ORAL | Status: DC

## 2017-03-07 MED ORDER — PHENYLEPHRINE 40 MCG/ML (10ML) SYRINGE FOR IV PUSH (FOR BLOOD PRESSURE SUPPORT)
PREFILLED_SYRINGE | INTRAVENOUS | Status: AC
  Filled 2017-03-07: qty 10

## 2017-03-07 MED ORDER — ACETAMINOPHEN 325 MG PO TABS
650.0000 mg | ORAL_TABLET | ORAL | Status: DC | PRN
  Administered 2017-03-08: 650 mg via ORAL
  Filled 2017-03-07: qty 2

## 2017-03-07 MED ORDER — BISACODYL 10 MG RE SUPP: 10.0000 mg | Freq: Every day | RECTAL | Status: DC | PRN

## 2017-03-07 SURGICAL SUPPLY — 57 items
BAG DECANTER FOR FLEXI CONT (MISCELLANEOUS) ×2 IMPLANT
BENZOIN TINCTURE PRP APPL 2/3 (GAUZE/BANDAGES/DRESSINGS) ×2 IMPLANT
BIT DRILL NEURO 2X3.1 SFT TUCH (MISCELLANEOUS) ×1 IMPLANT
BLADE SURG 15 STRL LF DISP TIS (BLADE) ×1 IMPLANT
BLADE SURG 15 STRL SS (BLADE) ×1
BLADE ULTRA TIP 2M (BLADE) ×2 IMPLANT
BUR BARREL STRAIGHT FLUTE 4.0 (BURR) ×2 IMPLANT
BUR MATCHSTICK NEURO 3.0 LAGG (BURR) ×2 IMPLANT
CANISTER SUCT 3000ML PPV (MISCELLANEOUS) ×2 IMPLANT
CARTRIDGE OIL MAESTRO DRILL (MISCELLANEOUS) ×1 IMPLANT
COVER MAYO STAND STRL (DRAPES) ×2 IMPLANT
DIFFUSER DRILL AIR PNEUMATIC (MISCELLANEOUS) ×2 IMPLANT
DRAPE LAPAROTOMY 100X72 PEDS (DRAPES) ×2 IMPLANT
DRAPE POUCH INSTRU U-SHP 10X18 (DRAPES) ×2 IMPLANT
DRAPE SURG 17X23 STRL (DRAPES) ×4 IMPLANT
DRILL NEURO 2X3.1 SOFT TOUCH (MISCELLANEOUS) ×2
ELECT REM PT RETURN 9FT ADLT (ELECTROSURGICAL) ×2
ELECTRODE REM PT RTRN 9FT ADLT (ELECTROSURGICAL) ×1 IMPLANT
GAUZE SPONGE 4X4 12PLY STRL (GAUZE/BANDAGES/DRESSINGS) ×2 IMPLANT
GAUZE SPONGE 4X4 12PLY STRL LF (GAUZE/BANDAGES/DRESSINGS) ×2 IMPLANT
GAUZE SPONGE 4X4 16PLY XRAY LF (GAUZE/BANDAGES/DRESSINGS) IMPLANT
GLOVE BIO SURGEON STRL SZ8 (GLOVE) ×2 IMPLANT
GLOVE BIO SURGEON STRL SZ8.5 (GLOVE) ×2 IMPLANT
GLOVE BIOGEL PI IND STRL 7.0 (GLOVE) ×3 IMPLANT
GLOVE BIOGEL PI IND STRL 7.5 (GLOVE) ×1 IMPLANT
GLOVE BIOGEL PI INDICATOR 7.0 (GLOVE) ×3
GLOVE BIOGEL PI INDICATOR 7.5 (GLOVE) ×1
GOWN STRL REUS W/ TWL LRG LVL3 (GOWN DISPOSABLE) IMPLANT
GOWN STRL REUS W/ TWL XL LVL3 (GOWN DISPOSABLE) IMPLANT
GOWN STRL REUS W/TWL LRG LVL3 (GOWN DISPOSABLE)
GOWN STRL REUS W/TWL XL LVL3 (GOWN DISPOSABLE)
HEMOSTAT POWDER KIT SURGIFOAM (HEMOSTASIS) ×2 IMPLANT
KIT BASIN OR (CUSTOM PROCEDURE TRAY) ×2 IMPLANT
KIT ROOM TURNOVER OR (KITS) ×2 IMPLANT
MARKER SKIN DUAL TIP RULER LAB (MISCELLANEOUS) ×2 IMPLANT
NEEDLE HYPO 22GX1.5 SAFETY (NEEDLE) ×2 IMPLANT
NEEDLE SPNL 18GX3.5 QUINCKE PK (NEEDLE) ×2 IMPLANT
NS IRRIG 1000ML POUR BTL (IV SOLUTION) ×2 IMPLANT
OIL CARTRIDGE MAESTRO DRILL (MISCELLANEOUS) ×2
PACK LAMINECTOMY NEURO (CUSTOM PROCEDURE TRAY) ×2 IMPLANT
PEEK VISTA 14X14X7MM (Peek) ×2 IMPLANT
PEEK VISTA 14X14X8MM (Peek) ×2 IMPLANT
PIN DISTRACTION 14MM (PIN) ×4 IMPLANT
PLATE ANT CERV XTEND 2 LV 28 (Plate) ×2 IMPLANT
PUTTY KINEX BIOACTIVE 5CC (Bone Implant) ×2 IMPLANT
RUBBERBAND STERILE (MISCELLANEOUS) IMPLANT
SCREW XTD VAR 4.2 SELF TAP (Screw) ×12 IMPLANT
SPONGE INTESTINAL PEANUT (DISPOSABLE) ×4 IMPLANT
SPONGE SURGIFOAM ABS GEL SZ50 (HEMOSTASIS) ×2 IMPLANT
STRIP CLOSURE SKIN 1/2X4 (GAUZE/BANDAGES/DRESSINGS) ×2 IMPLANT
SUT VIC AB 0 CT1 27 (SUTURE) ×2
SUT VIC AB 0 CT1 27XBRD ANTBC (SUTURE) ×2 IMPLANT
SUT VIC AB 3-0 SH 8-18 (SUTURE) ×4 IMPLANT
TAPE CLOTH SURG 4X10 WHT LF (GAUZE/BANDAGES/DRESSINGS) ×2 IMPLANT
TOWEL GREEN STERILE (TOWEL DISPOSABLE) ×2 IMPLANT
TOWEL GREEN STERILE FF (TOWEL DISPOSABLE) ×2 IMPLANT
WATER STERILE IRR 1000ML POUR (IV SOLUTION) ×2 IMPLANT

## 2017-03-07 NOTE — Progress Notes (Signed)
Patient ID: James OPHEIM Sr., male   DOB: 10-20-1929, 83 y.o.   MRN: [id number] Subjective:  the patient is alert and pleasant.  He looks well.   He is in no apparent distress.  Objective: Vital signs in last 24 hours: Temp:  [97.2 F (36.2 C)-97.7 F (36.5 C)] 97.7 F (36.5 C) (03/21 1555) Pulse Rate:  [57-106] 106 (03/21 1712) Resp:  [13-28] 18 (03/21 1712) BP: (130-154)/(62-129) 150/80 (03/21 1712) SpO2:  [90 %-97 %] 95 % (03/21 1712) Weight:  [90.7 kg (200 lb)-90.8 kg (200 lb 3.2 oz)] 90.7 kg (200 lb) (03/21 0848)  Intake/Output from previous day: No intake/output data recorded. Intake/Output this shift: Total I/O In: 1100 [I.V.:1100] Out: 200 [Blood:200]  Physical exam the patient is alert and pleasant.  He is moving all 4 extremities well.  His deltoid strength is normal bilaterally.  The patient's dressing is clean and dry.  There is no hematoma or shift.  Lab Results: No results for input(s): WBC, HGB, HCT, PLT in the last 72 hours. BMET No results for input(s): NA, K, CL, CO2, GLUCOSE, BUN, CREATININE, CALCIUM in the last 72 hours.  Studies/Results: Dg Cervical Spine 2 Or 3 Views  Result Date: 03/07/2017 CLINICAL DATA:  Intraoperative localization for cervical decompression EXAM: CERVICAL SPINE - 1 VIEW COMPARISON:  12/28/2016 FINDINGS: Initial images demonstrate a surgical instrument in the anterior aspect of the C2-3 interspace. Subsequent finishing image demonstrates anterior fixation at C3-4 and C4-5 with anterior fixation. IMPRESSION: Intraoperative cervical fusion. Electronically Signed   By: Inez Catalina M.D.   On: 03/07/2017 14:28    Assessment/Plan: The patient is doing well. I spoke with his wife.  LOS: 0 days     Tyresa Prindiville D 03/07/2017, 5:45 PM

## 2017-03-07 NOTE — Anesthesia Procedure Notes (Addendum)
Procedure Name: Intubation Date/Time: 03/07/2017 11:10 AM Performed by: Trixie Deis A Pre-anesthesia Checklist: Patient identified, Emergency Drugs available, Suction available and Patient being monitored Patient Re-evaluated:Patient Re-evaluated prior to inductionOxygen Delivery Method: Circle System Utilized Preoxygenation: Pre-oxygenation with 100% oxygen Intubation Type: IV induction and Inhalational induction Ventilation: Mask ventilation without difficulty Laryngoscope Size: Glidescope and 4 Grade View: Grade I Tube type: Oral Tube size: 7.5 mm Number of attempts: 1 Airway Equipment and Method: Stylet,  Oral airway and LTA kit utilized Placement Confirmation: ETT inserted through vocal cords under direct vision,  positive ETCO2 and breath sounds checked- equal and bilateral Secured at: 22 cm Tube secured with: Tape Dental Injury: Teeth and Oropharynx as per pre-operative assessment

## 2017-03-07 NOTE — H&P (Signed)
Subjective: Patient is an 83 year old white male who has complained of chronic neck pain poor. More recently he has developed increasing numbness and tingling weakness in his hands. He was worked up with a cervical MRI which demonstrated spondylosis stenosis, etc. at C3-4 and C4-5. I discussed the various treatment options with the patient. He has decided to proceed with surgery.   Past Medical History:  Diagnosis Date  . Arthritis   . Back pain   . CAD (coronary artery disease)   . Cataract    right  . COPD (chronic obstructive pulmonary disease) ([place])    SPiriva and SYmbicort daily. Albuterol as needed  . Diverticulosis   . Dyspnea    with exertion  . Enlarged prostate    takes Flomax daily  . GERD (gastroesophageal reflux disease)   . History of chicken pox   . History of gout   . History of measles   . History of mumps   . Hyperglycemia   . Hyperlipidemia    takes Atorvastatin daily  . Hypertension    takes Metoprolol daily as well as Lotensin HCT  . Hypotension   . Joint pain   . Microscopic colitis   . Prostate cancer ([place])   . TIA (transient ischemic attack)   . Weakness    numbness and tingling.mainly on right    Past Surgical History:  Procedure Laterality Date  . APPENDECTOMY  1950  . CARDIAC CATHETERIZATION  05/29/2013   EF=40-45%. Moderate pulmonary hypertension. Infero/ lateral hypokinesis  . cataract surgery Left   . COLONOSCOPY    . CORONARY ARTERY BYPASS GRAFT  2011   x 5  . MRI BRAIN  06/07/2013   Mild chronic involutional changes. No acute abnormalities  . MRI of neck    . Myocardial Perfusion Scan  05/29/2013   Abnormal myocardial perfusion image consistent with myocardial infarction  . PROSTATE SURGERY  11/18/2012   radiation seed  . TOTAL HIP ARTHROPLASTY Left 1997   hip joint replacement    Allergies  Allergen Reactions  . Indomethacin Nausea Only    Social History  Substance Use Topics  . Smoking status: Former Smoker    Packs/day:  1.00    Years: 11.00    Types: Cigarettes  . Smokeless tobacco: Never Used     Comment: quit smoking 30 yrs ago  . Alcohol use 0.0 oz/week     Comment: beer occasionally    Family History  Problem Relation Age of Onset  . Heart attack Mother   . Stroke Father   . Heart attack Father   . Hypertension Father   . Heart attack Sister   . Bladder Cancer Brother   . Kidney cancer Brother   . Heart Problems Brother    Prior to Admission medications   Medication Sig Start Date End Date Taking? Authorizing Provider  albuterol (PROAIR HFA) 108 (90 Base) MCG/ACT inhaler Inhale 2 puffs into the lungs every 6 (six) hours as needed for wheezing or shortness of breath. 09/28/16  Yes Laverle Hobby, MD  aspirin EC 325 MG tablet Take 325 mg by mouth daily.    Yes Historical Provider, MD  atorvastatin (LIPITOR) 80 MG tablet Take 1 tablet (80 mg total) by mouth daily. Patient taking differently: Take 80 mg by mouth daily at 6 PM.  07/17/16  Yes Carmon Ginsberg, PA  azelastine (OPTIVAR) 0.05 % ophthalmic solution Apply 1 drop to eye 2 (two) times daily. Patient taking differently: Apply 1 drop to eye 2 (  two) times daily as needed (allergies).  08/23/16  Yes Birdie Sons, MD  benazepril-hydrochlorthiazide (LOTENSIN HCT) 20-12.5 MG tablet Take 1 tablet by mouth daily. 12/26/16  Yes Birdie Sons, MD  beta carotene w/minerals (OCUVITE) tablet Take 1 tablet by mouth daily.   Yes Historical Provider, MD  budesonide-formoterol (SYMBICORT) 160-4.5 MCG/ACT inhaler Inhale 2 puffs into the lungs 2 (two) times daily. 06/21/16  Yes Birdie Sons, MD  fluticasone (FLONASE) 50 MCG/ACT nasal spray Place 2 sprays into both nostrils daily. Patient taking differently: Place 2 sprays into both nostrils every evening.  11/13/16  Yes Birdie Sons, MD  lansoprazole (PREVACID) 30 MG capsule TAKE 1 CAPSULE EVERY DAY 10/06/16  Yes [name], MD  LORazepam (ATIVAN) 1 MG tablet TAKE ONE TABLET BY MOUTH AT BEDTIME AS  NEEDED FOR ANXIETY Patient taking differently: TAKE ONE TABLET BY MOUTH AT BEDTIME 12/15/16  Yes Birdie Sons, MD  Magnesium 400 MG CAPS Take 1 capsule by mouth daily.    Yes Historical Provider, MD  metoprolol (LOPRESSOR) 50 MG tablet Take 1 tablet (50 mg total) by mouth 2 (two) times daily. 08/25/16  Yes Birdie Sons, MD  montelukast (SINGULAIR) 10 MG tablet TAKE 1 TABLET BY MOUTH DAILY Patient taking differently: TAKE 1 TABLET BY MOUTH DAILY IN THE EVENING 06/19/16  Yes Birdie Sons, MD  MULTIPLE VITAMIN PO Take 1 tablet by mouth daily.    Yes Historical Provider, MD  OMEGA-3 FATTY ACIDS PO Take 1,000 mg by mouth daily.    Yes Historical Provider, MD  SPIRIVA HANDIHALER 18 MCG inhalation capsule INHALE 1 CAPSULE AS DIRECTED ONCE A DAY 01/16/17  Yes Birdie Sons, MD  tamsulosin (FLOMAX) 0.4 MG CAPS capsule Take 1 capsule (0.4 mg total) by mouth daily. Patient taking differently: Take 0.4 mg by mouth every evening.  11/06/16  Yes Birdie Sons, MD  tiZANidine (ZANAFLEX) 4 MG tablet TAKE ONE TABLET AT BEDTIME Patient taking differently: TAKE ONE TABLET AT BEDTIME AS NEEDED FOR SPASMS 01/07/17  Yes Birdie Sons, MD  furosemide (LASIX) 20 MG tablet Take 1 tablet (20 mg total) by mouth daily. Patient not taking: Reported on 02/26/2017 05/23/16   Birdie Sons, MD  HYDROcodone-acetaminophen (NORCO/VICODIN) 5-325 MG tablet Take 1 tablet by mouth 2 (two) times daily as needed. Patient not taking: Reported on 02/26/2017 07/04/16   Birdie Sons, MD  predniSONE (STERAPRED UNI-PAK 48 TAB) 10 MG (48) TBPK tablet As directed Patient not taking: Reported on 02/26/2017 12/29/16   Birdie Sons, MD     Review of Systems  Positive ROS: As above  All other systems have been reviewed and were otherwise negative with the exception of those mentioned in the HPI and as above.  Objective: Vital signs in last 24 hours: Temp:  [97.6 F (36.4 C)] 97.6 F (36.4 C) (03/21 0819) Pulse Rate:  [57] 57  (03/21 0819) Resp:  [18] 18 (03/21 0819) BP: (151)/(62) 151/62 (03/21 0819) SpO2:  [97 %] 97 % (03/21 0819) Weight:  [90.7 kg (200 lb)-90.8 kg (200 lb 3.2 oz)] 90.7 kg (200 lb) (03/21 0848)  General Appearance: Alert Head: Normocephalic, without obvious abnormality, atraumatic Eyes: PERRL, conjunctiva/corneas clear, EOM's intact,    Ears: Normal  Throat: Normal  Neck: Supple, Back: unremarkable Lungs: Clear to auscultation bilaterally, respirations unlabored Heart: Regular rate and rhythm, no murmur, rub or gallop Abdomen: Soft, non-tender Extremities: Extremities normal, atraumatic, no cyanosis or edema Skin: unremarkable  NEUROLOGIC:  Mental status: alert and oriented,Motor Exam - grossly normal Sensory Exam - grossly normal Reflexes:  Coordination - grossly normal Gait - grossly normal Balance - grossly normal Cranial Nerves: I: smell Not tested  II: visual acuity  OS: Normal  OD: Normal   II: visual fields Full to confrontation  II: pupils Equal, round, reactive to light  III,VII: ptosis None  III,IV,VI: extraocular muscles  Full ROM  V: mastication Normal  V: facial light touch sensation  Normal  V,VII: corneal reflex  Present  VII: facial muscle function - upper  Normal  VII: facial muscle function - lower Normal  VIII: hearing Not tested  IX: soft palate elevation  Normal  IX,X: gag reflex Present  XI: trapezius strength  5/5  XI: sternocleidomastoid strength 5/5  XI: neck flexion strength  5/5  XII: tongue strength  Normal    Data Review Lab Results  Component Value Date   WBC 5.9 02/28/2017   HGB 14.4 02/28/2017   HCT 44.8 02/28/2017   MCV 90.0 02/28/2017   PLT 160 02/28/2017   Lab Results  Component Value Date   NA 137 02/28/2017   K 4.6 02/28/2017   CL 101 02/28/2017   CO2 30 02/28/2017   BUN 14 02/28/2017   CREATININE 1.14 02/28/2017   GLUCOSE 141 (H) 02/28/2017   No results found for: INR, PROTIME  Assessment/Plan: C3-4 and C4-5  spondylosis, stenosis, cervical radiculopathy, cervical myelopathy, cervicalgia: I have discussed the situation with the patient and reviewed his MRI scan with him. We have discussed the various treatment options including surgery. I have described the surgical treatment option of a C3-4 and C4-5 anterior cervical discectomy, fusion, and plating. I have shown him surgical models. We have discussed the risks, benefits, alternatives, expected postoperative course, and likelihood of achieving her goals with surgery. I have answered all the patient's questions. He has decided to proceed with surgery.   Dragon Thrush D 03/07/2017 10:32 AM

## 2017-03-07 NOTE — Progress Notes (Signed)
Patient arrived to unit from PACU, A+Ox4, VSS, O2 @ 2L via [place], NAD noted. Patient ambulated from stretcher to bed, tol well. Anterior post op dsg C/D/I. Denies questions/concerns at this time.

## 2017-03-07 NOTE — Transfer of Care (Signed)
Immediate Anesthesia Transfer of Care Note  Patient: James Croissant Sr.  Procedure(s) Performed: Procedure(s) with comments: ANTERIOR CERVICAL DECOMPRESSION/DISCECTOMY FUSION CERVICAL THREE- CERVICAL FOUR, CERVICAL FOUR- CERVICAL FIVE (N/A) - ANTERIOR CERVICAL DECOMPRESSION/DISCECTOMY FUSION CERVICAL 3- CERVICAL 4, CERVIACL 4- CERVICAL 5  Patient Location: PACU  Anesthesia Type:General  Level of Consciousness: awake, alert  and oriented  Airway & Oxygen Therapy: Patient Spontanous Breathing and Patient connected to nasal cannula oxygen  Post-op Assessment: Report given to RN, Post -op Vital signs reviewed and stable and Patient moving all extremities  Post vital signs: Reviewed and stable  Last Vitals:  Vitals:   03/07/17 0819  BP: (!) 151/62  Pulse: (!) 57  Resp: 18  Temp: 36.4 C    Last Pain:  Vitals:   03/07/17 0819  TempSrc: Oral         Complications: No apparent anesthesia complications

## 2017-03-07 NOTE — Progress Notes (Signed)
Patient instructed with use of incentive spirometer with teachback. Ambulated to chair with standby assist. No complaints at this time. Continue to monitor.

## 2017-03-07 NOTE — Anesthesia Preprocedure Evaluation (Addendum)
Anesthesia Evaluation  Patient identified by MRN, date of birth, ID band Patient awake    Reviewed: Allergy & Precautions, NPO status , Patient's Chart, lab work & pertinent test results, reviewed documented beta blocker date and time   History of Anesthesia Complications Negative for: history of anesthetic complications  Airway Mallampati: II  TM Distance: >3 FB Neck ROM: Limited    Dental  (+) Partial Upper, Dental Advisory Given, Teeth Intact   Pulmonary COPD,  COPD inhaler, former smoker,    Pulmonary exam normal breath sounds clear to auscultation       Cardiovascular hypertension, Pt. on medications and Pt. on home beta blockers + CAD and + CABG  + dysrhythmias (paroxysmal) Atrial Fibrillation  Rhythm:Regular Rate:Normal  '15 myoview: EF of 37%. There was a mild fixed inferior defect. There was no significant reversibility.  '15 Echocardiogram showed EF around 40% with no high-grade valvular disease '15 cath: grafts patent   Neuro/Psych TIA   GI/Hepatic Neg liver ROS, GERD  Medicated and Controlled,  Endo/Other  negative endocrine ROS  Renal/GU negative Renal ROS     Musculoskeletal  (+) Arthritis ,   Abdominal   Peds  Hematology negative hematology ROS (+)   Anesthesia Other Findings   Reproductive/Obstetrics                         Anesthesia Physical Anesthesia Plan  ASA: III  Anesthesia Plan: General   Post-op Pain Management:    Induction: Intravenous  Airway Management Planned: Oral ETT and Video Laryngoscope Planned  Additional Equipment:   Intra-op Plan:   Post-operative Plan: Extubation in OR  Informed Consent: I have reviewed the patients History and Physical, chart, labs and discussed the procedure including the risks, benefits and alternatives for the proposed anesthesia with the patient or authorized representative who has indicated his/her understanding and  acceptance.   Dental advisory given  Plan Discussed with: Anesthesiologist, CRNA and Surgeon  Anesthesia Plan Comments: (Plan routine monitors, GETA with VideoGlide intubation)       Anesthesia Quick Evaluation

## 2017-03-07 NOTE — Op Note (Signed)
Brief history: It is an 83 year old white male who presented with neck pain bila hand numbness weakness, and tingling. He failed medical management and was worked up with a cervical MRI. This demonstrated severe spondylosis and stenosis at C4-5 and C3-4. I discussed the situation with the patient and his wife. He has weighed the risks, benefits, and alternatives to surgery and decided proceed with the C3-4 and C4-5 anterior cervical discectomy, fusion, and plating.  Preoperative diagnosis: C3-4 and C4-5 disc degeneration, spondylosis, stenosis, cervical myelopathy, cervicalgia  Postoperative diagnosis: The same  Procedure: C3-4 and C4-5 Anterior cervical discectomy/decompression; C3-4 and C4-5 interbody arthrodesis with local morcellized autograft bone and Kinnex bone graft extender; insertion of interbody prosthesis at C3-4 and C4-5 (Zimmer peek interbody prosthesis); anterior cervical plating from C3-C5 with globus titanium plate  Surgeon: Dr. Earle Gell  Asst.: Dr. Sherley Bounds  Anesthesia: Gen. endotracheal  Estimated blood loss: 125 mL  Drains: None  Complications: None  Description of procedure: The patient was brought to the operating room by the anesthesia team. General endotracheal anesthesia was induced. A roll was placed under the patient's shoulders to keep the neck in the neutral position. The patient's anterior cervical region was then prepared with Betadine scrub and Betadine solution. Sterile drapes were applied.  The area to be incised was then injected with Marcaine with epinephrine solution. I then used a scalpel to make a transverse incision in the patient's left anterior neck. I used the Metzenbaum scissors to divide the platysmal muscle and then to dissect medial to the sternocleidomastoid muscle, jugular vein, and carotid artery. I carefully dissected down towards the anterior cervical spine identifying the esophagus and retracting it medially. Then using Kitner swabs to  clear soft tissue from the anterior cervical spine. We then inserted a bent spinal needle into the upper exposed intervertebral disc space. We then obtained intraoperative radiographs confirm our location.  I then used electrocautery to detach the medial border of the longus colli muscle bilaterally from the C3-4 and C4-5 intervertebral disc spaces. I then inserted the Caspar self-retaining retractor underneath the longus colli muscle bilaterally to provide exposure.  We then incised the intervertebral disc at C4-5. We then performed a partial intervertebral discectomy with a pituitary forceps and the Karlin curettes. I then inserted distraction screws into the vertebral bodies at C4-5. We then distracted the interspace. We then used the high-speed drill to decorticate the vertebral endplates at R4-1, to drill away the remainder of the intervertebral disc, to drill away some posterior spondylosis, and to thin out the posterior longitudinal ligament. I then incised ligament with the arachnoid knife. We then removed the ligament with a Kerrison punches undercutting the vertebral endplates and decompressing the thecal sac. We then performed foraminotomies about the bilateral C5 nerve roots. This completed the decompression at this level.  We then repeated this procedure and analogous fashion at C3-4 decompressing the thecal sac and the bilateral C4 nerve roots.  We now turned our to attention to the interbody fusion. We used the trial spacers to determine the appropriate size for the interbody prosthesis. We then pre-filled prosthesis with a combination of local morcellized autograft bone that we obtained during decompression as well as Kinnex bone graft extender. We then inserted the prosthesis into the distracted interspace at C3-4 and C4-5. We then removed the distraction screws. There was a good snug fit of the prosthesis in the interspace.  Having completed the fusion we now turned attention to the  anterior spinal instrumentation. We  used the high-speed drill to drill away some anterior spondylosis at the disc spaces so that the plate lay down flat. We selected the appropriate length titanium anterior cervical plate. We laid it along the anterior aspect of the vertebral bodies from C3-C5. We then drilled 14 mm holes at C3, C4 and C5. We then secured the plate to the vertebral bodies by placing two 14 mm self-tapping screws at C3, C4 and C5. We then obtained intraoperative radiograph. The demonstrating good position of the instrumentation. We therefore secured the screws the plate the locking each cam. This completed the instrumentation.  We then obtained hemostasis using bipolar electrocautery. We irrigated the wound out with bacitracin solution. We then removed the retractor. We inspected the esophagus for any damage. There was none apparent. We then reapproximated patient's platysmal muscle with interrupted 3-0 Vicryl suture. We then reapproximated the subcutaneous tissue with interrupted 3-0 Vicryl suture. The skin was reapproximated with Steri-Strips and benzoin. The wound was then covered with bacitracin ointment. A sterile dressing was applied. The drapes were removed. Patient was subsequently extubated by the anesthesia team and transported to the post anesthesia care unit in stable condition. All sponge instrument and needle counts were reportedly correct at the end of this case.

## 2017-03-08 ENCOUNTER — Encounter (HOSPITAL_COMMUNITY): Payer: Self-pay | Admitting: Neurosurgery

## 2017-03-08 DIAGNOSIS — M4802 Spinal stenosis, cervical region: Secondary | ICD-10-CM | POA: Diagnosis not present

## 2017-03-08 MED ORDER — DOCUSATE SODIUM 100 MG PO CAPS: 100.0000 mg | ORAL_CAPSULE | Freq: Two times a day (BID) | ORAL | 0 refills | Status: DC

## 2017-03-08 MED ORDER — OXYCODONE HCL 5 MG PO TABS: 5.0000 mg | ORAL_TABLET | ORAL | 0 refills | Status: DC | PRN

## 2017-03-08 NOTE — Care Management Note (Signed)
Case Management Note  Patient Details  Name: James HAGERMAN Sr. MRN: [id number] Date of Birth: 09-18-1929  Subjective/Objective:   Pt underwent:  C3-4 and C4-5 Anterior cervical discectomy/decompression; C3-4 and C4-5 interbody arthrodesis with local morcellized autograft bone and Kinnex bone graft extender; insertion of interbody prosthesis at C3-4 and C4-5 (Zimmer peek interbody prosthesis); anterior cervical plating from C3-C5 with globus titanium plate. He is from home with his spouse.                 Action/Plan: Pt discharging home with self care. Pt has insurance and a PCP. No further needs per CM.  Expected Discharge Date:  03/08/17               Expected Discharge Plan:  Home/Self Care  In-House Referral:     Discharge planning Services     Post Acute Care Choice:    Choice offered to:     DME Arranged:    DME Agency:     HH Arranged:    HH Agency:     Status of Service:  Completed, signed off  If discussed at [employer] of Stay Meetings, dates discussed:    Additional Comments:  Pollie Friar, RN 03/08/2017, 11:38 AM

## 2017-03-08 NOTE — Anesthesia Postprocedure Evaluation (Signed)
Anesthesia Post Note  Patient: James YARDLEY Sr.  Procedure(s) Performed: Procedure(s) (LRB): ANTERIOR CERVICAL DECOMPRESSION/DISCECTOMY FUSION CERVICAL THREE- CERVICAL FOUR, CERVICAL FOUR- CERVICAL FIVE (N/A)  Patient location during evaluation: PACU Anesthesia Type: General Level of consciousness: awake and alert Pain management: pain level controlled Vital Signs Assessment: post-procedure vital signs reviewed and stable Respiratory status: spontaneous breathing, nonlabored ventilation, respiratory function stable and patient connected to nasal cannula oxygen Cardiovascular status: blood pressure returned to baseline and stable Postop Assessment: no signs of nausea or vomiting Anesthetic complications: no       Last Vitals:  Vitals:   03/08/17 0444 03/08/17 0951  BP: 126/66 140/66  Pulse: 72 80  Resp: 18 18  Temp: 36.9 C 37.4 C    Last Pain:  Vitals:   03/08/17 0951  TempSrc: Oral  PainSc:                  [name]

## 2017-03-08 NOTE — Discharge Summary (Signed)
Physician Discharge Summary  Patient ID: James BIERLEIN Sr. MRN: [id number] DOB/AGE: [birth year] 83 y.o.  Admit date: 03/07/2017 Discharge date: 03/08/2017  Admission Diagnoses:C3-4 and C4-5 disc degeneration, spondylosis, stenosis, cervical radiculopathy, cervical myelopathy  Discharge Diagnoses: The same Active Problems:   Cervical spondylosis with myelopathy   Discharged Condition: good  Hospital Course: I performed a C3-4 and C4-5 anterior cervical discectomy, fusion, and plating on the patient on 03/07/2017. The surgery went well.  The patient's postoperative course is unremarkable. On postoperative day #1 the patient felt well and requested discharge to home. He was given written and oral discharge instructions. All his questions were answered.   Consults: Physical therapy Significant Diagnostic Studies: None Treatments: C3-4 and C4-5 anterior cervical discectomy, fusion, and plating. Discharge Exam: Blood pressure 126/66, pulse 72, temperature 98.4 F (36.9 C), temperature source Oral, resp. rate 18, height 5' 8.5" (1.74 m), weight 90.7 kg (200 lb), SpO2 94 %. The patient is alert and pleasant. He looks well. His dressing is clean and dry. There is no hematoma or shift. His strength normal in all 4 extremities.  Disposition: Home  Discharge Instructions    Call MD for:  difficulty breathing, headache or visual disturbances    Complete by:  As directed    Call MD for:  extreme fatigue    Complete by:  As directed    Call MD for:  hives    Complete by:  As directed    Call MD for:  persistant dizziness or light-headedness    Complete by:  As directed    Call MD for:  persistant nausea and vomiting    Complete by:  As directed    Call MD for:  redness, tenderness, or signs of infection (pain, swelling, redness, odor or green/yellow discharge around incision site)    Complete by:  As directed    Call MD for:  severe uncontrolled pain    Complete by:  As directed    Call MD  for:  temperature >100.4    Complete by:  As directed    Diet - low sodium heart healthy    Complete by:  As directed    Discharge instructions    Complete by:  As directed    Call [phone] for a followup appointment. Take a stool softener while you are using pain medications.   Driving Restrictions    Complete by:  As directed    Do not drive for 2 weeks.   Increase activity slowly    Complete by:  As directed    Lifting restrictions    Complete by:  As directed    Do not lift more than 5 pounds. No excessive bending or twisting.   May shower / Bathe    Complete by:  As directed    He may shower after the pain she is removed 3 days after surgery. Leave the incision alone.   Remove dressing in 48 hours    Complete by:  As directed    Your stitches are under the scan and will dissolve by themselves. The Steri-Strips will fall off after you take a few showers. Do not rub back or pick at the wound, Leave the wound alone.     Allergies as of 03/08/2017      Reactions   Indomethacin Nausea Only      Medication List    STOP taking these medications   albuterol 108 (90 Base) MCG/ACT inhaler Commonly known as:  PROAIR HFA  HYDROcodone-acetaminophen 5-325 MG tablet Commonly known as:  NORCO/VICODIN   predniSONE 10 MG (48) Tbpk tablet Commonly known as:  STERAPRED UNI-PAK 48 TAB     TAKE these medications   aspirin EC 325 MG tablet Take 325 mg by mouth daily.   atorvastatin 80 MG tablet Commonly known as:  LIPITOR Take 1 tablet (80 mg total) by mouth daily. What changed:  when to take this   azelastine 0.05 % ophthalmic solution Commonly known as:  OPTIVAR Apply 1 drop to eye 2 (two) times daily. What changed:  when to take this  reasons to take this   benazepril-hydrochlorthiazide 20-12.5 MG tablet Commonly known as:  LOTENSIN HCT Take 1 tablet by mouth daily.   beta carotene w/minerals tablet Take 1 tablet by mouth daily.   budesonide-formoterol 160-4.5  MCG/ACT inhaler Commonly known as:  SYMBICORT Inhale 2 puffs into the lungs 2 (two) times daily.   docusate sodium 100 MG capsule Commonly known as:  COLACE Take 1 capsule (100 mg total) by mouth 2 (two) times daily.   fluticasone 50 MCG/ACT nasal spray Commonly known as:  FLONASE Place 2 sprays into both nostrils daily. What changed:  when to take this   furosemide 20 MG tablet Commonly known as:  LASIX Take 1 tablet (20 mg total) by mouth daily.   lansoprazole 30 MG capsule Commonly known as:  PREVACID TAKE 1 CAPSULE EVERY DAY   LORazepam 1 MG tablet Commonly known as:  ATIVAN TAKE ONE TABLET BY MOUTH AT BEDTIME AS NEEDED FOR ANXIETY What changed:  See the new instructions.   Magnesium 400 MG Caps Take 1 capsule by mouth daily.   metoprolol 50 MG tablet Commonly known as:  LOPRESSOR Take 1 tablet (50 mg total) by mouth 2 (two) times daily.   montelukast 10 MG tablet Commonly known as:  SINGULAIR TAKE 1 TABLET BY MOUTH DAILY What changed:  See the new instructions.   MULTIPLE VITAMIN PO Take 1 tablet by mouth daily.   OMEGA-3 FATTY ACIDS PO Take 1,000 mg by mouth daily.   oxyCODONE 5 MG immediate release tablet Commonly known as:  Oxy IR/ROXICODONE Take 1-2 tablets (5-10 mg total) by mouth every 3 (three) hours as needed for breakthrough pain.   SPIRIVA HANDIHALER 18 MCG inhalation capsule Generic drug:  tiotropium INHALE 1 CAPSULE AS DIRECTED ONCE A DAY   tamsulosin 0.4 MG Caps capsule Commonly known as:  FLOMAX Take 1 capsule (0.4 mg total) by mouth daily. What changed:  when to take this   tiZANidine 4 MG tablet Commonly known as:  ZANAFLEX TAKE ONE TABLET AT BEDTIME What changed:  See the new instructions.        SignedOphelia Charter 03/08/2017, 9:48 AM

## 2017-03-08 NOTE — Progress Notes (Signed)
DC instructions provided to patient and family, deny questions/concerns at this time. Patient transported to front entrance via [place] by volunteer services, tol well.

## 2017-03-09 ENCOUNTER — Ambulatory Visit: Payer: Medicare Other

## 2017-03-12 ENCOUNTER — Ambulatory Visit: Payer: Medicare Other

## 2017-03-13 ENCOUNTER — Emergency Department
Admission: EM | Admit: 2017-03-13 | Discharge: 2017-03-13 | Payer: Medicare Other | Attending: Emergency Medicine | Admitting: Emergency Medicine

## 2017-03-13 ENCOUNTER — Emergency Department: Payer: Medicare Other

## 2017-03-13 ENCOUNTER — Observation Stay (HOSPITAL_COMMUNITY)
Admission: AD | Admit: 2017-03-13 | Discharge: 2017-03-14 | Disposition: A | Discharge status: Home / Self Care | Payer: Medicare Other | Source: Other Acute Inpatient Hospital | Attending: Neurosurgery | Admitting: Neurosurgery

## 2017-03-13 ENCOUNTER — Other Ambulatory Visit: Payer: Self-pay

## 2017-03-13 ENCOUNTER — Encounter: Payer: Self-pay | Admitting: *Deleted

## 2017-03-13 ENCOUNTER — Encounter (HOSPITAL_COMMUNITY): Payer: Self-pay | Admitting: General Practice

## 2017-03-13 DIAGNOSIS — R1310 Dysphagia, unspecified: Secondary | ICD-10-CM | POA: Diagnosis present

## 2017-03-13 DIAGNOSIS — N400 Enlarged prostate without lower urinary tract symptoms: Secondary | ICD-10-CM | POA: Diagnosis not present

## 2017-03-13 DIAGNOSIS — X58XXXA Exposure to other specified factors, initial encounter: Secondary | ICD-10-CM | POA: Insufficient documentation

## 2017-03-13 DIAGNOSIS — Z8673 Personal history of transient ischemic attack (TIA), and cerebral infarction without residual deficits: Secondary | ICD-10-CM | POA: Insufficient documentation

## 2017-03-13 DIAGNOSIS — Y939 Activity, unspecified: Secondary | ICD-10-CM | POA: Diagnosis not present

## 2017-03-13 DIAGNOSIS — I110 Hypertensive heart disease with heart failure: Secondary | ICD-10-CM | POA: Diagnosis not present

## 2017-03-13 DIAGNOSIS — K219 Gastro-esophageal reflux disease without esophagitis: Secondary | ICD-10-CM | POA: Diagnosis not present

## 2017-03-13 DIAGNOSIS — R221 Localized swelling, mass and lump, neck: Secondary | ICD-10-CM | POA: Diagnosis not present

## 2017-03-13 DIAGNOSIS — Z7951 Long term (current) use of inhaled steroids: Secondary | ICD-10-CM | POA: Insufficient documentation

## 2017-03-13 DIAGNOSIS — Y832 Surgical operation with anastomosis, bypass or graft as the cause of abnormal reaction of the patient, or of later complication, without mention of misadventure at the time of the procedure: Secondary | ICD-10-CM | POA: Diagnosis not present

## 2017-03-13 DIAGNOSIS — I480 Paroxysmal atrial fibrillation: Secondary | ICD-10-CM | POA: Diagnosis not present

## 2017-03-13 DIAGNOSIS — Y929 Unspecified place or not applicable: Secondary | ICD-10-CM | POA: Diagnosis not present

## 2017-03-13 DIAGNOSIS — Z87891 Personal history of nicotine dependence: Secondary | ICD-10-CM | POA: Insufficient documentation

## 2017-03-13 DIAGNOSIS — I10 Essential (primary) hypertension: Secondary | ICD-10-CM | POA: Insufficient documentation

## 2017-03-13 DIAGNOSIS — S1093XA Contusion of unspecified part of neck, initial encounter: Secondary | ICD-10-CM | POA: Diagnosis not present

## 2017-03-13 DIAGNOSIS — I9762 Postprocedural hemorrhage of a circulatory system organ or structure following other procedure: Secondary | ICD-10-CM | POA: Diagnosis not present

## 2017-03-13 DIAGNOSIS — R0600 Dyspnea, unspecified: Secondary | ICD-10-CM

## 2017-03-13 DIAGNOSIS — J95861 Postprocedural hematoma of a respiratory system organ or structure following other procedure: Secondary | ICD-10-CM | POA: Diagnosis not present

## 2017-03-13 DIAGNOSIS — S199XXA Unspecified injury of neck, initial encounter: Secondary | ICD-10-CM | POA: Diagnosis present

## 2017-03-13 DIAGNOSIS — J449 Chronic obstructive pulmonary disease, unspecified: Secondary | ICD-10-CM | POA: Insufficient documentation

## 2017-03-13 DIAGNOSIS — Z951 Presence of aortocoronary bypass graft: Secondary | ICD-10-CM | POA: Insufficient documentation

## 2017-03-13 DIAGNOSIS — I97621 Postprocedural hematoma of a circulatory system organ or structure following other procedure: Secondary | ICD-10-CM

## 2017-03-13 DIAGNOSIS — I5022 Chronic systolic (congestive) heart failure: Secondary | ICD-10-CM | POA: Insufficient documentation

## 2017-03-13 DIAGNOSIS — I2510 Atherosclerotic heart disease of native coronary artery without angina pectoris: Secondary | ICD-10-CM | POA: Diagnosis not present

## 2017-03-13 DIAGNOSIS — E785 Hyperlipidemia, unspecified: Secondary | ICD-10-CM | POA: Diagnosis not present

## 2017-03-13 DIAGNOSIS — I5023 Acute on chronic systolic (congestive) heart failure: Secondary | ICD-10-CM

## 2017-03-13 DIAGNOSIS — Z8546 Personal history of malignant neoplasm of prostate: Secondary | ICD-10-CM | POA: Insufficient documentation

## 2017-03-13 DIAGNOSIS — R0602 Shortness of breath: Secondary | ICD-10-CM | POA: Diagnosis not present

## 2017-03-13 DIAGNOSIS — Z981 Arthrodesis status: Secondary | ICD-10-CM | POA: Insufficient documentation

## 2017-03-13 DIAGNOSIS — R222 Localized swelling, mass and lump, trunk: Secondary | ICD-10-CM | POA: Diagnosis not present

## 2017-03-13 DIAGNOSIS — L7632 Postprocedural hematoma of skin and subcutaneous tissue following other procedure: Principal | ICD-10-CM | POA: Insufficient documentation

## 2017-03-13 DIAGNOSIS — Y999 Unspecified external cause status: Secondary | ICD-10-CM | POA: Insufficient documentation

## 2017-03-13 DIAGNOSIS — Z7982 Long term (current) use of aspirin: Secondary | ICD-10-CM | POA: Insufficient documentation

## 2017-03-13 DIAGNOSIS — M7989 Other specified soft tissue disorders: Secondary | ICD-10-CM | POA: Insufficient documentation

## 2017-03-13 DIAGNOSIS — Z96642 Presence of left artificial hip joint: Secondary | ICD-10-CM | POA: Diagnosis not present

## 2017-03-13 DIAGNOSIS — Z79899 Other long term (current) drug therapy: Secondary | ICD-10-CM | POA: Diagnosis not present

## 2017-03-13 HISTORY — DX: Unspecified hearing loss, unspecified ear: H91.90

## 2017-03-13 HISTORY — DX: Paroxysmal atrial fibrillation: I48.0

## 2017-03-13 LAB — COMPREHENSIVE METABOLIC PANEL
ALT: 19 U/L (ref 17–63)
AST: 31 U/L (ref 15–41)
Albumin: 3.7 g/dL (ref 3.5–5.0)
Alkaline Phosphatase: 66 U/L (ref 38–126)
Anion gap: 8 (ref 5–15)
BUN: 14 mg/dL (ref 6–20)
CO2: 28 mmol/L (ref 22–32)
Calcium: 8.5 mg/dL — ABNORMAL LOW (ref 8.9–10.3)
Chloride: 94 mmol/L — ABNORMAL LOW (ref 101–111)
Creatinine, Ser: 0.96 mg/dL (ref 0.61–1.24)
GFR calc Af Amer: >60 mL/min (ref >60)
GFR calc non Af Amer: >60 mL/min (ref >60)
Glucose, Bld: 129 mg/dL — ABNORMAL HIGH (ref 65–99)
Potassium: 4.2 mmol/L (ref 3.5–5.1)
Sodium: 130 mmol/L — ABNORMAL LOW (ref 135–145)
Total Bilirubin: 1.7 mg/dL — ABNORMAL HIGH (ref 0.3–1.2)
Total Protein: 6.7 g/dL (ref 6.5–8.1)

## 2017-03-13 LAB — CBC WITH DIFFERENTIAL/PLATELET
Basophils Absolute: 0 10*3/uL (ref 0–0.1)
Basophils Relative: 1 %
Eosinophils Absolute: 0.1 10*3/uL (ref 0–0.7)
Eosinophils Relative: 1 %
HCT: 41.4 % (ref 40.0–52.0)
Hemoglobin: 13.7 g/dL (ref 13.0–18.0)
Lymphocytes Relative: 11 %
Lymphs Abs: 0.7 10*3/uL — ABNORMAL LOW (ref 1.0–3.6)
MCH: 29.2 pg (ref 26.0–34.0)
MCHC: 33.1 g/dL (ref 32.0–36.0)
MCV: 88.2 fL (ref 80.0–100.0)
Monocytes Absolute: 0.6 10*3/uL (ref 0.2–1.0)
Monocytes Relative: 10 %
Neutro Abs: 4.6 10*3/uL (ref 1.4–6.5)
Neutrophils Relative %: 77 %
Platelets: 160 10*3/uL (ref 150–440)
RBC: 4.7 MIL/uL (ref 4.40–5.90)
RDW: 14.2 % (ref 11.5–14.5)
WBC: 5.9 10*3/uL (ref 3.8–10.6)

## 2017-03-13 LAB — BRAIN NATRIURETIC PEPTIDE: B Natriuretic Peptide: 160 pg/mL — ABNORMAL HIGH (ref 0.0–100.0)

## 2017-03-13 LAB — TROPONIN I: Troponin I: 0.03 ng/mL (ref <0.03)

## 2017-03-13 MED ORDER — POTASSIUM CHLORIDE IN NACL 20-0.9 MEQ/L-% IV SOLN
INTRAVENOUS | Status: DC
  Administered 2017-03-13: 20:00:00 via INTRAVENOUS
  Filled 2017-03-13: qty 1000

## 2017-03-13 MED ORDER — PANTOPRAZOLE SODIUM 40 MG PO TBEC
40.0000 mg | DELAYED_RELEASE_TABLET | Freq: Every day | ORAL | Status: DC
  Administered 2017-03-13 – 2017-03-14 (×2): 40 mg via ORAL
  Filled 2017-03-13 (×2): qty 1

## 2017-03-13 MED ORDER — TIOTROPIUM BROMIDE MONOHYDRATE 18 MCG IN CAPS
18.0000 ug | ORAL_CAPSULE | Freq: Every day | RESPIRATORY_TRACT | Status: DC
  Administered 2017-03-14: 18 ug via RESPIRATORY_TRACT
  Filled 2017-03-13: qty 5

## 2017-03-13 MED ORDER — MOMETASONE FURO-FORMOTEROL FUM 200-5 MCG/ACT IN AERO
2.0000 | INHALATION_SPRAY | Freq: Two times a day (BID) | RESPIRATORY_TRACT | Status: DC
  Administered 2017-03-13 – 2017-03-14 (×2): 2 via RESPIRATORY_TRACT
  Filled 2017-03-13: qty 8.8

## 2017-03-13 MED ORDER — METOPROLOL TARTRATE 50 MG PO TABS
50.0000 mg | ORAL_TABLET | Freq: Two times a day (BID) | ORAL | Status: DC
  Administered 2017-03-13 – 2017-03-14 (×2): 50 mg via ORAL
  Filled 2017-03-13 (×2): qty 1

## 2017-03-13 MED ORDER — DEXAMETHASONE SODIUM PHOSPHATE 10 MG/ML IJ SOLN
10.0000 mg | Freq: Once | INTRAMUSCULAR | Status: AC
Start: 2017-03-13 — End: 2017-03-13
  Administered 2017-03-13: 10 mg via INTRAVENOUS
  Filled 2017-03-13: qty 1

## 2017-03-13 MED ORDER — ACETAMINOPHEN 325 MG PO TABS: 650.0000 mg | ORAL_TABLET | Freq: Four times a day (QID) | ORAL | Status: DC | PRN

## 2017-03-13 MED ORDER — TAMSULOSIN HCL 0.4 MG PO CAPS
0.4000 mg | ORAL_CAPSULE | Freq: Every day | ORAL | Status: DC
  Filled 2017-03-13: qty 1

## 2017-03-13 MED ORDER — BENAZEPRIL HCL 10 MG PO TABS
20.0000 mg | ORAL_TABLET | Freq: Every day | ORAL | Status: DC
  Administered 2017-03-13 – 2017-03-14 (×2): 20 mg via ORAL
  Filled 2017-03-13 (×3): qty 2

## 2017-03-13 MED ORDER — OXYCODONE HCL 5 MG PO TABS: 5.0000 mg | ORAL_TABLET | ORAL | Status: DC | PRN

## 2017-03-13 MED ORDER — ATORVASTATIN CALCIUM 80 MG PO TABS
80.0000 mg | ORAL_TABLET | Freq: Every day | ORAL | Status: DC
  Administered 2017-03-13: 80 mg via ORAL
  Filled 2017-03-13: qty 1

## 2017-03-13 MED ORDER — FUROSEMIDE 40 MG PO TABS
20.0000 mg | ORAL_TABLET | Freq: Every day | ORAL | Status: DC
  Administered 2017-03-13 – 2017-03-14 (×2): 20 mg via ORAL
  Filled 2017-03-13 (×2): qty 1

## 2017-03-13 MED ORDER — KETOTIFEN FUMARATE 0.025 % OP SOLN
1.0000 [drp] | Freq: Two times a day (BID) | OPHTHALMIC | Status: DC
  Filled 2017-03-13: qty 5

## 2017-03-13 MED ORDER — IOPAMIDOL (ISOVUE-370) INJECTION 76%
75.0000 mL | Freq: Once | INTRAVENOUS | Status: AC | PRN
Start: 2017-03-13 — End: 2017-03-13
  Administered 2017-03-13: 75 mL via INTRAVENOUS

## 2017-03-13 MED ORDER — ONDANSETRON HCL 4 MG PO TABS: 4.0000 mg | ORAL_TABLET | Freq: Four times a day (QID) | ORAL | Status: DC | PRN

## 2017-03-13 MED ORDER — MONTELUKAST SODIUM 10 MG PO TABS
10.0000 mg | ORAL_TABLET | Freq: Every day | ORAL | Status: DC
  Administered 2017-03-13: 10 mg via ORAL
  Filled 2017-03-13: qty 1

## 2017-03-13 MED ORDER — MAGNESIUM 200 MG PO TABS
1.0000 | ORAL_TABLET | Freq: Every day | ORAL | Status: DC
  Filled 2017-03-13 (×2): qty 1

## 2017-03-13 MED ORDER — BENAZEPRIL-HYDROCHLOROTHIAZIDE 20-12.5 MG PO TABS: 1.0000 | ORAL_TABLET | Freq: Every day | ORAL | Status: DC

## 2017-03-13 MED ORDER — TIZANIDINE HCL 2 MG PO TABS
4.0000 mg | ORAL_TABLET | Freq: Every day | ORAL | Status: DC
  Administered 2017-03-13: 4 mg via ORAL
  Filled 2017-03-13 (×2): qty 2

## 2017-03-13 MED ORDER — DEXAMETHASONE SODIUM PHOSPHATE 4 MG/ML IJ SOLN
4.0000 mg | Freq: Four times a day (QID) | INTRAMUSCULAR | Status: AC
  Administered 2017-03-13 – 2017-03-14 (×4): 4 mg via INTRAVENOUS
  Filled 2017-03-13 (×4): qty 1

## 2017-03-13 MED ORDER — HYDROCHLOROTHIAZIDE 12.5 MG PO CAPS
12.5000 mg | ORAL_CAPSULE | Freq: Every day | ORAL | Status: DC
  Administered 2017-03-13 – 2017-03-14 (×2): 12.5 mg via ORAL
  Filled 2017-03-13 (×2): qty 1

## 2017-03-13 MED ORDER — ACETAMINOPHEN 650 MG RE SUPP: 650.0000 mg | Freq: Four times a day (QID) | RECTAL | Status: DC | PRN

## 2017-03-13 MED ORDER — FLUTICASONE PROPIONATE 50 MCG/ACT NA SUSP
2.0000 | Freq: Every day | NASAL | Status: DC
  Administered 2017-03-14: 2 via NASAL
  Filled 2017-03-13: qty 16

## 2017-03-13 MED ORDER — FUROSEMIDE 40 MG PO TABS
20.0000 mg | ORAL_TABLET | Freq: Once | ORAL | Status: AC
  Administered 2017-03-13: 20 mg via ORAL
  Filled 2017-03-13: qty 1

## 2017-03-13 MED ORDER — DOCUSATE SODIUM 100 MG PO CAPS
100.0000 mg | ORAL_CAPSULE | Freq: Two times a day (BID) | ORAL | Status: DC
  Administered 2017-03-13 – 2017-03-14 (×2): 100 mg via ORAL
  Filled 2017-03-13 (×2): qty 1

## 2017-03-13 MED ORDER — ONDANSETRON HCL 4 MG/2ML IJ SOLN: 4.0000 mg | Freq: Four times a day (QID) | INTRAMUSCULAR | Status: DC | PRN

## 2017-03-13 NOTE — Progress Notes (Signed)
Physician notified: Merdis Delay  At: 1628  Regarding: contacted office. FYI pt arrived. no orders at this time. Resting comfortably in bed on room air. Arnoldo Morale is admitting MD.

## 2017-03-13 NOTE — H&P (Signed)
Subjective: The patient is a 83 year old white male on whom I performed a C3-4 and C4-5 anterior cervical discectomy, fusion, and plating on 03/07/2017. He was discharged home on postoperative day #1. His wife called today and said he was having some dysphagia and she was concerned that he has some shortness of breath. I advised him to go to the ER for an evaluation. He went to the [facility] and was worked up with a cervical CT which demonstrated a cervical hematoma. The patient was transferred to [facility] for observation.  Presently the patient is alert and pleasant.  He admits to dysphagia but is improving. He denies shortness of breath.  Past Medical History:  Diagnosis Date  . Arthritis   . Back pain   . CAD (coronary artery disease)   . Cataract    right  . COPD (chronic obstructive pulmonary disease) ([place])    SPiriva and SYmbicort daily. Albuterol as needed  . Diverticulosis   . Dyspnea    with exertion  . Enlarged prostate    takes Flomax daily  . GERD (gastroesophageal reflux disease)   . History of chicken pox   . History of gout   . History of measles   . History of mumps   . HOH (hard of hearing)   . Hyperglycemia   . Hyperlipidemia    takes Atorvastatin daily  . Hypertension    takes Metoprolol daily as well as Lotensin HCT  . Hypotension   . Joint pain   . Microscopic colitis   . Prostate cancer ([place])   . TIA (transient ischemic attack)   . Weakness    numbness and tingling.mainly on right    Past Surgical History:  Procedure Laterality Date  . ANTERIOR CERVICAL DECOMP/DISCECTOMY FUSION N/A 03/07/2017   Procedure: ANTERIOR CERVICAL DECOMPRESSION/DISCECTOMY FUSION CERVICAL THREE- CERVICAL FOUR, CERVICAL FOUR- CERVICAL FIVE;  Surgeon: Newman Pies, MD;  Location: [facility];  Service: Neurosurgery;  Laterality: N/A;  ANTERIOR CERVICAL DECOMPRESSION/DISCECTOMY FUSION CERVICAL 3- CERVICAL 4, CERVIACL 4- CERVICAL 5  . APPENDECTOMY  1950   . CARDIAC CATHETERIZATION  05/29/2013   EF=40-45%. Moderate pulmonary hypertension. Infero/ lateral hypokinesis  . cataract surgery Left   . COLONOSCOPY    . CORONARY ARTERY BYPASS GRAFT  2011   x 5  . MRI BRAIN  06/07/2013   Mild chronic involutional changes. No acute abnormalities  . MRI of neck    . Myocardial Perfusion Scan  05/29/2013   Abnormal myocardial perfusion image consistent with myocardial infarction  . PROSTATE SURGERY  11/18/2012   radiation seed  . TOTAL HIP ARTHROPLASTY Left 1997   hip joint replacement    Allergies  Allergen Reactions  . Indomethacin Nausea Only    Social History  Substance Use Topics  . Smoking status: Former Smoker    Packs/day: 1.00    Years: 11.00    Types: Cigarettes  . Smokeless tobacco: Never Used     Comment: quit smoking 30 yrs ago  . Alcohol use 0.0 oz/week     Comment: beer occasionally    Family History  Problem Relation Age of Onset  . Heart attack Mother   . Stroke Father   . Heart attack Father   . Hypertension Father   . Heart attack Sister   . Bladder Cancer Brother   . Kidney cancer Brother   . Heart Problems Brother    Prior to Admission medications   Medication Sig Start Date End Date Taking?  Authorizing Provider  aspirin EC 325 MG tablet Take 325 mg by mouth daily.     Historical Provider, MD  atorvastatin (LIPITOR) 80 MG tablet Take 1 tablet (80 mg total) by mouth daily. Patient taking differently: Take 80 mg by mouth daily at 6 PM.  07/17/16   Carmon Ginsberg, PA  azelastine (OPTIVAR) 0.05 % ophthalmic solution Apply 1 drop to eye 2 (two) times daily. Patient taking differently: Apply 1 drop to eye 2 (two) times daily as needed (allergies).  08/23/16   Birdie Sons, MD  benazepril-hydrochlorthiazide (LOTENSIN HCT) 20-12.5 MG tablet Take 1 tablet by mouth daily. 12/26/16   Birdie Sons, MD  beta carotene w/minerals (OCUVITE) tablet Take 1 tablet by mouth daily.    Historical Provider, MD   budesonide-formoterol (SYMBICORT) 160-4.5 MCG/ACT inhaler Inhale 2 puffs into the lungs 2 (two) times daily. 06/21/16   Birdie Sons, MD  docusate sodium (COLACE) 100 MG capsule Take 1 capsule (100 mg total) by mouth 2 (two) times daily. 03/08/17   Newman Pies, MD  fluticasone [facility]) 50 MCG/ACT nasal spray Place 2 sprays into both nostrils daily. Patient taking differently: Place 2 sprays into both nostrils every evening.  11/13/16   Birdie Sons, MD  furosemide (LASIX) 20 MG tablet Take 1 tablet (20 mg total) by mouth daily. Patient not taking: Reported on 02/26/2017 05/23/16   Birdie Sons, MD  lansoprazole (PREVACID) 30 MG capsule TAKE 1 CAPSULE EVERY DAY 10/06/16   [name], MD  LORazepam (ATIVAN) 1 MG tablet TAKE ONE TABLET BY MOUTH AT BEDTIME AS NEEDED FOR ANXIETY Patient taking differently: TAKE ONE TABLET BY MOUTH AT BEDTIME 12/15/16   Birdie Sons, MD  Magnesium 400 MG CAPS Take 1 capsule by mouth daily.     Historical Provider, MD  metoprolol (LOPRESSOR) 50 MG tablet Take 1 tablet (50 mg total) by mouth 2 (two) times daily. 08/25/16   Birdie Sons, MD  montelukast (SINGULAIR) 10 MG tablet TAKE 1 TABLET BY MOUTH DAILY Patient taking differently: TAKE 1 TABLET BY MOUTH DAILY IN THE EVENING 06/19/16   Birdie Sons, MD  MULTIPLE VITAMIN PO Take 1 tablet by mouth daily.     Historical Provider, MD  OMEGA-3 FATTY ACIDS PO Take 1,000 mg by mouth daily.     Historical Provider, MD  oxyCODONE (OXY IR/ROXICODONE) 5 MG immediate release tablet Take 1-2 tablets (5-10 mg total) by mouth every 3 (three) hours as needed for breakthrough pain. 03/08/17   Newman Pies, MD  SPIRIVA HANDIHALER 18 MCG inhalation capsule INHALE 1 CAPSULE AS DIRECTED ONCE A DAY 01/16/17   Birdie Sons, MD  tamsulosin (FLOMAX) 0.4 MG CAPS capsule Take 1 capsule (0.4 mg total) by mouth daily. Patient not taking: Reported on 03/13/2017 11/06/16   Birdie Sons, MD  tiZANidine (ZANAFLEX) 4 MG tablet  TAKE ONE TABLET AT BEDTIME Patient taking differently: TAKE ONE TABLET AT BEDTIME AS NEEDED FOR SPASMS 01/07/17   Birdie Sons, MD     Review of Systems  Positive ROS:  As above  All other systems have been reviewed and were otherwise negative with the exception of those mentioned in the HPI and as above.  Objective: Vital signs in last 24 hours: Temp:  [98 F (36.7 C)-98.7 F (37.1 C)] 98.7 F (37.1 C) (03/27 1616) Pulse Rate:  [39-107] 107 (03/27 1616) Resp:  [14-27] 16 (03/27 1616) BP: (107-176)/(65-98) 172/98 (03/27 1616) SpO2:  [94 %-98 %] 95 % (  03/27 1616) Weight:  [90.7 kg (200 lb)] 90.7 kg (200 lb) (03/27 0768)  Physical exam:  General: An alert and pleasant 83 year old white male in no apparent distress.  HEENT: The patient's left cervical incision is healing well. There is some peri-incisional ecchymosis. His neck is at baseline quite large. There is a moderate palpable hematoma. There is no significant midline shift. His voice seems to be at his baseline.  Thorax: Symmetric  Abdomen: Soft  Extremities: Unremarkable  Neurologic exam: The patient is alert and oriented. He is moving all 4 extremities well.  I have reviewed the patient's cervical CT performed at [facility] today. It demonstrates a moderate prevertebral fluid density consistent with hematoma.   Data Review Lab Results  Component Value Date   WBC 5.9 03/13/2017   HGB 13.7 03/13/2017   HCT 41.4 03/13/2017   MCV 88.2 03/13/2017   PLT 160 03/13/2017   Lab Results  Component Value Date   NA 130 (L) 03/13/2017   K 4.2 03/13/2017   CL 94 (L) 03/13/2017   CO2 28 03/13/2017   BUN 14 03/13/2017   CREATININE 0.96 03/13/2017   GLUCOSE 129 (H) 03/13/2017   No results found for: INR, PROTIME  Assessment/Plan: Dysphagia, cervical hematoma: I have discussed the situation with the patient. He seems to be improving. I'll admit him for observation and start Decadron. I don't  think he needs surgery presently. I think he'll continue to improve.   Toa Mia D 03/13/2017 6:20 PM

## 2017-03-13 NOTE — ED Provider Notes (Addendum)
[facility] Emergency Department Provider Note       Time seen: ----------------------------------------- 9:50 AM on 03/13/2017 -----------------------------------------     I have reviewed the triage vital signs and the nursing notes.   HISTORY   Chief Complaint Shortness of Breath; Constipation; and Leg Swelling    HPI James [name] Sr. is a 83 y.o. male who presents to the ED for difficulty breathing. Patient had anterior cervical fusion 6 days ago in [place] and has noted persistent shortness of breath and swelling. Patient has continued to note swelling around the incision site in his neck and also reports increased shortness of breath since his surgery. He hasn't noted increased peripheral edema and swelling as well as constipation. He denies fevers or chills, denies chest pain.   Past Medical History:  Diagnosis Date  . Arthritis   . Back pain   . CAD (coronary artery disease)   . Cataract    right  . COPD (chronic obstructive pulmonary disease) ([place])    SPiriva and SYmbicort daily. Albuterol as needed  . Diverticulosis   . Dyspnea    with exertion  . Enlarged prostate    takes Flomax daily  . GERD (gastroesophageal reflux disease)   . History of chicken pox   . History of gout   . History of measles   . History of mumps   . Hyperglycemia   . Hyperlipidemia    takes Atorvastatin daily  . Hypertension    takes Metoprolol daily as well as Lotensin HCT  . Hypotension   . Joint pain   . Microscopic colitis   . Prostate cancer ([place])   . TIA (transient ischemic attack)   . Weakness    numbness and tingling.mainly on right    Patient Active Problem List   Diagnosis Date Noted  . Cervical spondylosis with myelopathy 03/07/2017  . Restrictive lung disease 09/26/2016  . Cervical neck pain with evidence of disc disease 07/04/2016  . PVC (premature ventricular contraction) 11/16/2015  . Abdominal pain 07/27/2015  . Adenocarcinoma  of prostate ([place]) 07/27/2015  . Arthritis 07/27/2015  . Blood in feces 07/27/2015  . BPH (benign prostatic hyperplasia) 07/27/2015  . COPD (chronic obstructive pulmonary disease) ([place]) 07/27/2015  . GERD (gastroesophageal reflux disease) 07/27/2015  . Gout 07/27/2015  . Hyperglycemia 07/27/2015  . Hypotension 07/27/2015  . Insomnia 07/27/2015  . Shortness of breath 07/27/2015  . TIA (transient ischemic attack) 07/27/2015  . Atrial fibrillation ([place]) 11/16/2014  . BP (high blood pressure) 10/13/2014  . Urinary hesitancy 09/02/2012  . ED (erectile dysfunction) of organic origin 06/10/2010  . CAD (coronary artery disease) 03/14/2010  . LBP (low back pain) 03/01/2009  . Allergic rhinitis 09/28/2008  . Essential (primary) hypertension 08/02/2007  . Hyperlipidemia 08/02/2007  . Acquired spondylolisthesis 03/20/2005  . Diverticulosis of colon without hemorrhage 12/18/2000  . Arthropathy 12/18/1998    Past Surgical History:  Procedure Laterality Date  . ANTERIOR CERVICAL DECOMP/DISCECTOMY FUSION N/A 03/07/2017   Procedure: ANTERIOR CERVICAL DECOMPRESSION/DISCECTOMY FUSION CERVICAL THREE- CERVICAL FOUR, CERVICAL FOUR- CERVICAL FIVE;  Surgeon: Newman Pies, MD;  Location: [facility];  Service: Neurosurgery;  Laterality: N/A;  ANTERIOR CERVICAL DECOMPRESSION/DISCECTOMY FUSION CERVICAL 3- CERVICAL 4, CERVIACL 4- CERVICAL 5  . APPENDECTOMY  1950  . CARDIAC CATHETERIZATION  05/29/2013   EF=40-45%. Moderate pulmonary hypertension. Infero/ lateral hypokinesis  . cataract surgery Left   . COLONOSCOPY    . CORONARY ARTERY BYPASS GRAFT  2011   x 5  . MRI  BRAIN  06/07/2013   Mild chronic involutional changes. No acute abnormalities  . MRI of neck    . Myocardial Perfusion Scan  05/29/2013   Abnormal myocardial perfusion image consistent with myocardial infarction  . PROSTATE SURGERY  11/18/2012   radiation seed  . TOTAL HIP ARTHROPLASTY Left 1997   hip joint replacement     Allergies Indomethacin  Social History Social History  Substance Use Topics  . Smoking status: Former Smoker    Packs/day: 1.00    Years: 11.00    Types: Cigarettes  . Smokeless tobacco: Never Used     Comment: quit smoking 30 yrs ago  . Alcohol use 0.0 oz/week     Comment: beer occasionally    Review of Systems Constitutional: Negative for fever. ENT: Positive for neck swelling and pain Cardiovascular: Negative for chest pain. Respiratory: Positive shortness of breath Gastrointestinal: Negative for abdominal pain, vomiting and diarrhea. Genitourinary: Negative for dysuria. Musculoskeletal: Negative for back pain. Positive for peripheral edema Skin: Positive for neck ecchymosis Neurological: Negative for headaches, focal weakness or numbness.  10-point ROS otherwise negative.  ____________________________________________   PHYSICAL EXAM:  VITAL SIGNS: ED Triage Vitals [03/13/17 0942]  Enc Vitals Group     BP (!) 170/65     Pulse Rate 96     Resp 18     Temp 98.1 F (36.7 C)     Temp Source Oral     SpO2 95 %     Weight 200 lb (90.7 kg)     Height '5\' 8"'$  (1.727 m)     Head Circumference      Peak Flow      Pain Score 0     Pain Loc      Pain Edu?      Excl. in [place]?     Constitutional: Alert and oriented. Mild distress Eyes: Conjunctivae are normal. PERRL. Normal extraocular movements. ENT   Head: Normocephalic and atraumatic.   Nose: No congestion/rhinnorhea.   Mouth/Throat: Mucous membranes are moist.   Neck: No stridor. Anterior neck hematoma is noted, left-sided incision appears clean dry and intact. Cardiovascular: Normal rate, regular rhythm. No murmurs, rubs, or gallops. Respiratory: Tachypnea with grossly clear breath sounds. Gastrointestinal: Soft and nontender. Normal bowel sounds Musculoskeletal: Nontender with normal range of motion in extremities. Bilateral lower extremity pitting edema Neurologic:  Normal speech and  language. No gross focal neurologic deficits are appreciated.  Skin:  Skin is warm, dry and intact. No rash noted. Psychiatric: Mood and affect are normal. Speech and behavior are normal.  ____________________________________________  EKG: Interpreted by me. Sinus rhythm rate of 97 bpm, bigeminy, likely inferior and anterior infarct age indeterminate.  ____________________________________________  ED COURSE:  Pertinent labs & imaging results that were available during my care of the patient were reviewed by me and considered in my medical decision making (see chart for details). Patient presents for dyspnea and possible postop complication, we will assess with labs and imaging as indicated.   Procedures ____________________________________________   LABS (pertinent positives/negatives)  Labs Reviewed  CBC WITH DIFFERENTIAL/PLATELET - Abnormal; Notable for the following:       Result Value   Lymphs Abs 0.7 (*)    All other components within normal limits  BRAIN NATRIURETIC PEPTIDE - Abnormal; Notable for the following:    B Natriuretic Peptide 160.0 (*)    All other components within normal limits  TROPONIN I - Abnormal; Notable for the following:    Troponin I 0.03 (*)  All other components within normal limits  COMPREHENSIVE METABOLIC PANEL - Abnormal; Notable for the following:    Sodium 130 (*)    Chloride 94 (*)    Glucose, Bld 129 (*)    Calcium 8.5 (*)    Total Bilirubin 1.7 (*)    All other components within normal limits    RADIOLOGY Images were viewed by me  CT neck, CT chest IMPRESSION: 1. Prevertebral vs. retropharyngeal anteriorly bulging fluid collection measuring 15 x 29 x 47 mm (AP by transverse by CC). This appears to communicate with a second serpiginous fluid collection which tracks laterally into the left neck along the surgical approach (series 3, image 58). These could be postoperative hematomas or seromas, although surrounding thickened or  enhancing soft tissue rim raises the possibility of abscess. 2. Associated effacement of the lower pharynx. Associated pharyngeal mucosal edema. 3. NOTE ALSO a retropharyngeal course of both carotid arteries. 4. C3-C4 and C4-C5 ACDF hardware is intact. Stable cervical spine alignment. 5.  Calcified aortic atherosclerosis. 6. Chronic appearing T4 compression fracture.  IMPRESSION: 1. No CT evidence for acute pulmonary embolus. 2. 3.1 x 4.1 cm masslike opacity posterior right costophrenic sulcus. Lesion is more focal than typically seen for pneumonia. Rounded atelectasis is a consideration but neoplasm could have this appearance. PET-CT may prove helpful to further evaluate.  ____________________________________________  FINAL ASSESSMENT AND PLAN  Dyspnea, postop swelling  Plan: Patient's labs and imaging were dictated above. Patient had presented for dyspnea and postop swelling of his neck. CT scan reveals most likely postop hematoma. I will discuss with his neurosurgeon and he will likely need observation. Wife is uncomfortable taking him home due to his shortness of breath.   Earleen Newport, MD   Note: This note was generated in part or whole with voice recognition software. Voice recognition is usually quite accurate but there are transcription errors that can and very often do occur. I apologize for any typographical errors that were not detected and corrected.     Earleen Newport, MD 03/13/17 [place], MD 03/13/17 [phone]

## 2017-03-13 NOTE — ED Triage Notes (Signed)
States he had cervical surgery last week, swelling noted to throat area around incision, states increased SOB since surgery, states increased swelling in his legs and constipation, awake and alert

## 2017-03-13 NOTE — ED Notes (Signed)
ED Provider at bedside. 

## 2017-03-14 ENCOUNTER — Ambulatory Visit: Payer: Medicare Other

## 2017-03-14 ENCOUNTER — Encounter (HOSPITAL_COMMUNITY): Payer: Self-pay | Admitting: Physician Assistant

## 2017-03-14 DIAGNOSIS — R1310 Dysphagia, unspecified: Secondary | ICD-10-CM | POA: Diagnosis not present

## 2017-03-14 DIAGNOSIS — Z981 Arthrodesis status: Secondary | ICD-10-CM | POA: Diagnosis not present

## 2017-03-14 DIAGNOSIS — I2510 Atherosclerotic heart disease of native coronary artery without angina pectoris: Secondary | ICD-10-CM | POA: Diagnosis not present

## 2017-03-14 DIAGNOSIS — I480 Paroxysmal atrial fibrillation: Secondary | ICD-10-CM | POA: Diagnosis not present

## 2017-03-14 DIAGNOSIS — I5022 Chronic systolic (congestive) heart failure: Secondary | ICD-10-CM

## 2017-03-14 DIAGNOSIS — S1093XA Contusion of unspecified part of neck, initial encounter: Secondary | ICD-10-CM | POA: Diagnosis present

## 2017-03-14 DIAGNOSIS — I5023 Acute on chronic systolic (congestive) heart failure: Secondary | ICD-10-CM

## 2017-03-14 DIAGNOSIS — L7632 Postprocedural hematoma of skin and subcutaneous tissue following other procedure: Secondary | ICD-10-CM | POA: Diagnosis not present

## 2017-03-14 HISTORY — DX: Paroxysmal atrial fibrillation: I48.0

## 2017-03-14 MED ORDER — MAGNESIUM OXIDE 400 (241.3 MG) MG PO TABS
400.0000 mg | ORAL_TABLET | Freq: Every day | ORAL | Status: DC
  Administered 2017-03-14: 400 mg via ORAL
  Filled 2017-03-14: qty 1

## 2017-03-14 MED ORDER — FUROSEMIDE 20 MG PO TABS: 20.0000 mg | ORAL_TABLET | Freq: Every day | ORAL | 0 refills | Status: DC

## 2017-03-14 NOTE — Care Management Obs Status (Signed)
[facility] NOTIFICATION   Patient Details  Name: James WOLK Sr. MRN: [id number] Date of Birth: 06/15/1929   Medicare Observation Status Notification Given:  Yes    Zenon Mayo, RN 03/14/2017, 11:16 AM

## 2017-03-14 NOTE — Care Management Note (Signed)
Case Management Note  Patient Details  Name: James COCKERELL Sr. MRN: [id number] Date of Birth: [birth year]  Subjective/Objective:  From home with wife,  Had c3-4 and c4-5 anterior cervical discectomy, fusion and plating on 3/21, admitted from [facility] ER 3/27 with a cervical hematoma.  In rapid afib , Cards is following.  Was on lasix and decadron. PCP is Dr. Caryn Section, ambulatory.  He has no DME at home, pta indep.  Has transportation at dc.  For possible dc today.                    Action/Plan:   Expected Discharge Date:                  Expected Discharge Plan:  Home/Self Care  In-House Referral:     Discharge planning Services  CM Consult  Post Acute Care Choice:    Choice offered to:     DME Arranged:    DME Agency:     HH Arranged:    HH Agency:     Status of Service:  Completed, signed off  If discussed at [employer] of Stay Meetings, dates discussed:    Additional Comments:  Zenon Mayo, RN 03/14/2017, 11:16 AM

## 2017-03-14 NOTE — Consult Note (Signed)
CARDIOLOGY CONSULT NOTE   Patient ID: James Croissant Sr. MRN: [id number] DOB/AGE: November 25, 1929 83 y.o.  Admit date: 03/13/2017  Primary Physician   Lelon Huh, MD Primary Cardiologist   Dr Ubaldo Glassing, Mar[place]o Moores 01/31/2017 Reason for Consultation   PAF Requesting MD: Dr Arnoldo Morale  PJA:SNKN James [name]. is a 83 y.o. year old male with a history of CABG x 5 in 2011, cath ok 2016 after abnl MV, HLD, HTN, BPH, GERD, COPD, TIA. Mild ICM w/ EF 40% echo 08/2016. PAF dx 2017, pt on ASA 325 mg for anticoag  Pt who had C3-4 and C4-5 anterior cervical discectomy, fusion, and plating on 03/07/2017, admitted from [facility] ER 03/27 with a cervical hematoma. Conservative Mgt planned. Pt in rapid afib overnight and cards asked to see.   James [name] does not get palpitations. He will occasionally feel a little more SOB than usual, his HR will then be up on his home BP machine. He feels that is likely an episode of afib, but it never lasts that long. Cannot really say how often that happens. Doesn't bother him too much.   Pt not aware that he was having episodes of afib overnight, except he is a little more SOB than usual today. He feels this is secondary to the hematoma. No palpitations. No LE edema, mild orthopnea at baseline, no PND. No CP since the surgery. Currently, he is in [place] w/ frequent PVCs, bigeminy at times and is unaware of this.  Feels like he can swallow ok today, was having problems with that yesterday because of the hematoma.     Past Medical History:  Diagnosis Date  . Arthritis   . Back pain   . CAD (coronary artery disease)   . Cataract    right  . COPD (chronic obstructive pulmonary disease) ([place])    SPiriva and SYmbicort daily. Albuterol as needed  . Diverticulosis   . Dyspnea    with exertion  . Enlarged prostate    takes Flomax daily  . GERD (gastroesophageal reflux disease)   . History of chicken pox   . History of gout   . History of measles   . History of mumps   . HOH (hard of  hearing)   . Hyperglycemia   . Hyperlipidemia    takes Atorvastatin daily  . Hypertension    takes Metoprolol daily as well as Lotensin HCT  . Hypotension   . Joint pain   . Microscopic colitis   . PAF (paroxysmal atrial fibrillation) ([place]), RVR 03/14/2017  . Prostate cancer ([place])   . TIA (transient ischemic attack)   . Weakness    numbness and tingling.mainly on right     Past Surgical History:  Procedure Laterality Date  . ANTERIOR CERVICAL DECOMP/DISCECTOMY FUSION N/A 03/07/2017   Procedure: ANTERIOR CERVICAL DECOMPRESSION/DISCECTOMY FUSION CERVICAL THREE- CERVICAL FOUR, CERVICAL FOUR- CERVICAL FIVE;  Surgeon: Newman Pies, MD;  Location: [place];  Service: Neurosurgery;  Laterality: N/A;  ANTERIOR CERVICAL DECOMPRESSION/DISCECTOMY FUSION CERVICAL 3- CERVICAL 4, CERVIACL 4- CERVICAL 5  . APPENDECTOMY  1950  . CARDIAC CATHETERIZATION  05/29/2013   EF=40-45%. Moderate pulmonary hypertension. Infero/ lateral hypokinesis  . cataract surgery Left   . COLONOSCOPY    . CORONARY ARTERY BYPASS GRAFT  2011   x 5  . MRI BRAIN  06/07/2013   Mild chronic involutional changes. No acute abnormalities  . MRI of neck    . Myocardial Perfusion Scan  05/29/2013   Abnormal myocardial  perfusion image consistent with myocardial infarction  . PROSTATE SURGERY  11/18/2012   radiation seed  . TOTAL HIP ARTHROPLASTY Left 1997   hip joint replacement    Allergies  Allergen Reactions  . Indomethacin Nausea Only    I have reviewed the patient's current medications . atorvastatin  80 mg Oral Daily  . benazepril  20 mg Oral Daily   And  . hydrochlorothiazide  12.5 mg Oral Daily  . dexamethasone  4 mg Intravenous Q6H  . docusate sodium  100 mg Oral BID  . fluticasone  2 spray Each Nare Daily  . furosemide  20 mg Oral Daily  . ketotifen  1 drop Both Eyes BID  . magnesium oxide  400 mg Oral Daily  . metoprolol  50 mg Oral BID  . mometasone-formoterol  2 puff Inhalation BID  . montelukast  10  mg Oral Daily  . pantoprazole  40 mg Oral Daily  . tamsulosin  0.4 mg Oral Daily  . tiotropium  18 mcg Inhalation Daily  . tiZANidine  4 mg Oral QHS   . 0.9 % NaCl with KCl 20 mEq / L 75 mL/hr at 03/13/17 2000   acetaminophen **OR** acetaminophen, ondansetron **OR** ondansetron (ZOFRAN) IV, oxyCODONE, oxyCODONE  Prior to Admission medications   Medication Sig Start Date End Date Taking? Authorizing Provider  aspirin EC 325 MG tablet Take 325 mg by mouth daily.     Historical Provider, MD  atorvastatin (LIPITOR) 80 MG tablet Take 1 tablet (80 mg total) by mouth daily. Patient taking differently: Take 80 mg by mouth daily at 6 PM.  07/17/16   Carmon Ginsberg, PA  azelastine (OPTIVAR) 0.05 % ophthalmic solution Apply 1 drop to eye 2 (two) times daily. Patient taking differently: Apply 1 drop to eye 2 (two) times daily as needed (allergies).  08/23/16   Birdie Sons, MD  benazepril-hydrochlorthiazide (LOTENSIN HCT) 20-12.5 MG tablet Take 1 tablet by mouth daily. 12/26/16   Birdie Sons, MD  beta carotene w/minerals (OCUVITE) tablet Take 1 tablet by mouth daily.    Historical Provider, MD  budesonide-formoterol (SYMBICORT) 160-4.5 MCG/ACT inhaler Inhale 2 puffs into the lungs 2 (two) times daily. 06/21/16   Birdie Sons, MD  docusate sodium (COLACE) 100 MG capsule Take 1 capsule (100 mg total) by mouth 2 (two) times daily. 03/08/17   Newman Pies, MD  fluticasone [facility]) 50 MCG/ACT nasal spray Place 2 sprays into both nostrils daily. Patient taking differently: Place 2 sprays into both nostrils every evening.  11/13/16   Birdie Sons, MD  furosemide (LASIX) 20 MG tablet Take 1 tablet (20 mg total) by mouth daily. Patient not taking: Reported on 02/26/2017 05/23/16   Birdie Sons, MD  lansoprazole (PREVACID) 30 MG capsule TAKE 1 CAPSULE EVERY DAY 10/06/16   [name], MD  LORazepam (ATIVAN) 1 MG tablet TAKE ONE TABLET BY MOUTH AT BEDTIME AS NEEDED FOR ANXIETY Patient taking  differently: TAKE ONE TABLET BY MOUTH AT BEDTIME 12/15/16   Birdie Sons, MD  Magnesium 400 MG CAPS Take 1 capsule by mouth daily.     Historical Provider, MD  metoprolol (LOPRESSOR) 50 MG tablet Take 1 tablet (50 mg total) by mouth 2 (two) times daily. 08/25/16   Birdie Sons, MD  montelukast (SINGULAIR) 10 MG tablet TAKE 1 TABLET BY MOUTH DAILY Patient taking differently: TAKE 1 TABLET BY MOUTH DAILY IN THE EVENING 06/19/16   Birdie Sons, MD  MULTIPLE VITAMIN PO Take  1 tablet by mouth daily.     Historical Provider, MD  OMEGA-3 FATTY ACIDS PO Take 1,000 mg by mouth daily.     Historical Provider, MD  oxyCODONE (OXY IR/ROXICODONE) 5 MG immediate release tablet Take 1-2 tablets (5-10 mg total) by mouth every 3 (three) hours as needed for breakthrough pain. 03/08/17   Newman Pies, MD  SPIRIVA HANDIHALER 18 MCG inhalation capsule INHALE 1 CAPSULE AS DIRECTED ONCE A DAY 01/16/17   Birdie Sons, MD  tamsulosin (FLOMAX) 0.4 MG CAPS capsule Take 1 capsule (0.4 mg total) by mouth daily. Patient not taking: Reported on 03/13/2017 11/06/16   Birdie Sons, MD  tiZANidine (ZANAFLEX) 4 MG tablet TAKE ONE TABLET AT BEDTIME Patient taking differently: TAKE ONE TABLET AT BEDTIME AS NEEDED FOR SPASMS 01/07/17   Birdie Sons, MD     Social History   Social History  . Marital status: Married    Spouse name: N/A  . Number of children: 1  . Years of education: N/A   Occupational History  . Retired    Social History Main Topics  . Smoking status: Former Smoker    Packs/day: 1.00    Years: 11.00    Types: Cigarettes  . Smokeless tobacco: Never Used     Comment: quit smoking 30 yrs ago  . Alcohol use 0.0 oz/week     Comment: beer occasionally  . Drug use: No  . Sexual activity: Not on file   Other Topics Concern  . Not on file   Social History Narrative   Lives in [place], [place].    Family Status  Relation Status  . Mother Deceased at age 41   MI  . Father Deceased at age 50     stroke  . Sister Deceased   death occured in her 38's  . Brother Deceased at age 72   Kidney cancer/ bladder cancer  . Brother Alive   Family History  Problem Relation Age of Onset  . Heart attack Mother   . Stroke Father   . Heart attack Father   . Hypertension Father   . Heart attack Sister   . Bladder Cancer Brother   . Kidney cancer Brother   . Heart Problems Brother      ROS:  Full 14 point review of systems complete and found to be negative unless listed above.  Physical Exam: Blood pressure (!) 160/147, pulse (!) 127, temperature 98.4 F (36.9 C), temperature source Oral, resp. rate (!) 37, height '5\' 8"'$  (1.727 m), weight 187 lb 13.3 oz (85.2 kg), SpO2 93 %.  General: Well developed, well nourished, male in no acute distress Head: Eyes PERRLA, No xanthomas.   Normocephalic and atraumatic, oropharynx without edema or exudate. Dentition: fair;  Lungs: some rales bases, upper airway wheeze noted Heart: Heart irregular rate and rhythm with S1, S2, soft murmur. pulses are 2+ all 4 extrem.   Neck: No carotid bruits. No lymphadenopathy.  JVD not seen well 2nd hematoma Abdomen: Bowel sounds present, abdomen soft and non-tender without masses or hernias noted. Msk:  No spine or cva tenderness. No weakness, no joint deformities or effusions. Extremities: No clubbing or cyanosis. No edema.  Neuro: Alert and oriented X 3. No focal deficits noted. Psych:  Good affect, responds appropriately Skin: No rashes or lesions noted.  Labs:   Lab Results  Component Value Date   WBC 5.9 03/13/2017   HGB 13.7 03/13/2017   HCT 41.4 03/13/2017   MCV 88.2  03/13/2017   PLT 160 03/13/2017     Recent Labs Lab 03/13/17 0946  NA 130*  James 4.2  CL 94*  CO2 28  BUN 14  CREATININE 0.96  CALCIUM 8.5*  PROT 6.7  BILITOT 1.7*  ALKPHOS 66  ALT 19  AST 31  GLUCOSE 129*  ALBUMIN 3.7    Recent Labs  03/13/17 0946  TROPONINI 0.03*   Echo: 08/2016 EF 40%  ECG:  SR, frequent  PVCs  Telemetry: 4 episodes of atrial fib, 3/4 were brief, the 4th one lasted just over 2 hours  Radiology:  Ct Soft Tissue Neck W Contrast Result Date: 03/13/2017 CLINICAL DATA:  83 year old male status post cervical spine surgery last week with throat swelling, increase shortness of breath. EXAM: CT NECK WITH CONTRAST TECHNIQUE: Multidetector CT imaging of the neck was performed using the standard protocol following the bolus administration of intravenous contrast. CONTRAST:  75 mL Isovue 370 COMPARISON:  [facility] intraoperative cervical spine images 03/07/17. Preoperative cervical spine MRI 12/28/2016 FINDINGS: Pharynx and larynx: Larynx is within normal limits. There is a bulky semi circular shaped fluid collection in the prevertebral or retropharyngeal space measuring up to 15 mm in thickness (series 3, image 45) anterior to the ACDF C3 through C5 hardware. The collection encompasses 15 x 29 x 47 mm (estimated volume 10 mL) and effaces the lower pharynx. No gas within the collection, but possible rim enhancement as seen on series 3. That collection also appears to communicate with fluid that tracks laterally from the left retropharyngeal space at the C5 level toward the left neck incision site. Did this collection is less hypodense but seems also to have a thickened or enhancing rim (series 3, image 58). The superficial portion of this collection contains trace gas as seen on series 3, image 63 at the platysma. There is associated generalized oropharynx and hypopharynx mucosal edema. The parapharyngeal spaces and nasopharynx are negative. Salivary glands: Negative sublingual space and submandibular glands. Negative parotid glands. Thyroid: Negative thyroid; the left lateral tracking fluid abuts the superior aspect of the left thyroid lobe. Lymph nodes: No cervical lymphadenopathy. Vascular: Major vascular structures in the neck and at the skullbase remain normally enhancing, including both  internal jugular veins. Note that there is a partially retropharyngeal course of both carotid arteries. There is bulky calcified atherosclerosis at the carotid bifurcations worse on the left. Calcified atherosclerosis at the skull base. Limited intracranial: Negative. Visualized orbits: No acute findings. Mastoids and visualized paranasal sinuses: Well pneumatized. Skeleton: C3-C4 and C4-C5 ACDF hardware appears intact. Anterolisthesis of C2 on C3 measuring 2-3 mm appears stable from the preoperative MRI. Stable mild anterolisthesis at the cervicothoracic junction. Chronic appearing T4 compression fracture. Previous median sternotomy. Upper chest: Mild pulmonary septal thickening. Negative superior mediastinum aside from Calcified aortic atherosclerosis. No pleural effusions. IMPRESSION: 1. Prevertebral vs. retropharyngeal anteriorly bulging fluid collection measuring 15 x 29 x 47 mm (AP by transverse by CC). This appears to communicate with a second serpiginous fluid collection which tracks laterally into the left neck along the surgical approach (series 3, image 58). These could be postoperative hematomas or seromas, although surrounding thickened or enhancing soft tissue rim raises the possibility of abscess. 2. Associated effacement of the lower pharynx. Associated pharyngeal mucosal edema. 3. NOTE ALSO a retropharyngeal course of both carotid arteries. 4. C3-C4 and C4-C5 ACDF hardware is intact. Stable cervical spine alignment. 5.  Calcified aortic atherosclerosis. 6. Chronic appearing T4 compression fracture. Electronically Signed   By:  Genevie Ann M.D.   On: 03/13/2017 11:53   Ct Angio Chest Pe W And/or Wo Contrast Result Date: 03/13/2017 CLINICAL DATA:  Neck surgery last week with throat swelling around incision. EXAM: CT ANGIOGRAPHY CHEST WITH CONTRAST TECHNIQUE: Multidetector CT imaging of the chest was performed using the standard protocol during bolus administration of intravenous contrast. Multiplanar CT  image reconstructions and MIPs were obtained to evaluate the vascular anatomy. CONTRAST:  75 cc Isovue 370 COMPARISON:  05/10/2007. FINDINGS: Cardiovascular: The heart is enlarged. Patient is status post CABG. No pericardial effusion. Atherosclerotic calcification is noted in the wall of the thoracic aorta. No filling defect in the opacified pulmonary arteries to suggest the presence of an acute pulmonary embolus. Mediastinum/Nodes: No mediastinal lymphadenopathy. There is no hilar lymphadenopathy. The esophagus has normal imaging features. Tiny hiatal hernia noted. There is no axillary lymphadenopathy. Lungs/Pleura: There is some mucus in the left mainstem bronchus. Chronic interstitial changes noted bilaterally. Asymmetric elevation right hemidiaphragm with some adjacent atelectasis or scarring. No airspace pulmonary edema or pleural effusion. 3.1 x 4.1 x 1.7 cm masslike opacities identified posterior right costophrenic sulcus (see image 61 series 7). Upper Abdomen: Unremarkable. Musculoskeletal: T10 compression fracture present since chest x-ray 06/04/2013. Review of the MIP images confirms the above findings. IMPRESSION: 1. No CT evidence for acute pulmonary embolus. 2. 3.1 x 4.1 cm masslike opacity posterior right costophrenic sulcus. Lesion is more focal than typically seen for pneumonia. Rounded atelectasis is a consideration but neoplasm could have this appearance. PET-CT may prove helpful to further evaluate. Electronically Signed   By: Misty Stanley M.D.   On: 03/13/2017 11:57    ASSESSMENT AND PLAN:   The patient was seen today by Dr Burt Knack, the patient evaluated and the data reviewed.   Principal Problem:   Hematoma of neck Active Problems:   Dysphagia   PAF (paroxysmal atrial fibrillation) ([place]), RVR   SignedLenoard Aden 03/14/2017 8:44 AM Beeper [phone]  Co-Sign MD  Patient seen, examined. Available data reviewed. Agree with findings, assessment, and plan as outlined by  Rosaria Ferries, PA-C. The patient is independently interviewed and examined. He is a very pleasant, obese elderly male in no distress. JVP is mildly elevated. Lung fields demonstrate fair air movement with end expiratory wheezing, heart is regular with frequent premature beats, no murmur or gallop. Abdomen is soft and obese. Extremities are without edema.  His records are reviewed through care everywhere. He is followed regularly by Dr. Ubaldo Glassing. The patient has an ischemic cardiomyopathy with previous CABG. He has been stable without symptoms of angina. He's had transient paroxysms of atrial fibrillation. CHADS-Vasc = 5. The patient is currently hospitalized because of a cervical hematoma after undergoing cervical spine fusion surgery. He seems to be responding well to conservative measures. His telemetry is reviewed and he had 2 hours of atrial fibrillation with heart rate in the 120s this morning. He is now back in sinus rhythm with frequent PVCs. He is treated with metoprolol 50 mg twice daily. His sinus rate is fairly slow and it is probably best to keep him on his current regimen. He is not a candidate for anticoagulation or even aspirin at this point because of the cervical hematoma. He has chronic systolic heart failure and seems reasonable to have him on low-dose furosemide 20 mg daily. Discussed his case with Dr Ubaldo Glassing - patient will call for FU appt when he gets home. From a cardiac perspective he is stable for discharge today.  Legrand Como  Burt Knack, M.D. 03/14/2017 8:50 AM

## 2017-03-14 NOTE — Progress Notes (Signed)
Discharge Note.  PIV removed without any issues. Reviewed discharge packet with patient and patient's wife. All of patient's medications reviewed. Reminded the patient the importance of follow up appointments. Patient and wife report they have had a good stay and are ready for discharge.

## 2017-03-14 NOTE — Progress Notes (Signed)
Patient HR has been tachying up in Afib. HR sustaining in 120s has been as high as 150s. Call placed to Neurosurgery. On call provider is now aware and would like to wait and see how patient responds to morning dose of Metoprolol. Patient is reporting some shortness of breathe with exertion but that is his only complaint. Denies any pain or tightness in neck. Will continue to monitor patient and pass along information to day shift staff.

## 2017-03-14 NOTE — Progress Notes (Signed)
Patient ID: James ODEA Sr., male   DOB: [birth year], 83 y.o.   MRN: [id number] Subjective:  The patient is alert and pleasant. He looks and feels better. He denies shortness of breath. He wants to go home.  Objective: Vital signs in last 24 hours: Temp:  [97.6 F (36.4 C)-98.7 F (37.1 C)] 97.6 F (36.4 C) (03/28 0500) Pulse Rate:  [39-127] 127 (03/28 0625) Resp:  [14-37] 37 (03/28 0500) BP: (102-176)/(58-98) 150/96 (03/28 0500) SpO2:  [94 %-98 %] 98 % (03/28 0500) Weight:  [85.2 kg (187 lb 13.3 oz)-90.7 kg (200 lb)] 85.2 kg (187 lb 13.3 oz) (03/27 2216)  Intake/Output from previous day: 03/27 0701 - 03/28 0700 In: 370 [P.O.:70; I.V.:300] Out: 1500 [Urine:1500] Intake/Output this shift: Total I/O In: 320 [P.O.:20; I.V.:300] Out: 925 [Urine:925]  Physical exam the patient is alert and pleasant. He is moving all 4 extremities well. His wound with is without change. There is some ecchymosis and a small palpable hematoma without significant midline shift.  Lab Results:  Recent Labs  03/13/17 0946  WBC 5.9  HGB 13.7  HCT 41.4  PLT 160   BMET  Recent Labs  03/13/17 0946  NA 130*  K 4.2  CL 94*  CO2 28  GLUCOSE 129*  BUN 14  CREATININE 0.96  CALCIUM 8.5*    Studies/Results: Ct Soft Tissue Neck W Contrast  Result Date: 03/13/2017 CLINICAL DATA:  84 year old male status post cervical spine surgery last week with throat swelling, increase shortness of breath. EXAM: CT NECK WITH CONTRAST TECHNIQUE: Multidetector CT imaging of the neck was performed using the standard protocol following the bolus administration of intravenous contrast. CONTRAST:  75 mL Isovue 370 COMPARISON:  [facility] intraoperative cervical spine images 03/07/17. Preoperative cervical spine MRI 12/28/2016 FINDINGS: Pharynx and larynx: Larynx is within normal limits. There is a bulky semi circular shaped fluid collection in the prevertebral or retropharyngeal space measuring up to 15 mm in  thickness (series 3, image 45) anterior to the ACDF C3 through C5 hardware. The collection encompasses 15 x 29 x 47 mm (estimated volume 10 mL) and effaces the lower pharynx. No gas within the collection, but possible rim enhancement as seen on series 3. That collection also appears to communicate with fluid that tracks laterally from the left retropharyngeal space at the C5 level toward the left neck incision site. Did this collection is less hypodense but seems also to have a thickened or enhancing rim (series 3, image 58). The superficial portion of this collection contains trace gas as seen on series 3, image 63 at the platysma. There is associated generalized oropharynx and hypopharynx mucosal edema. The parapharyngeal spaces and nasopharynx are negative. Salivary glands: Negative sublingual space and submandibular glands. Negative parotid glands. Thyroid: Negative thyroid; the left lateral tracking fluid abuts the superior aspect of the left thyroid lobe. Lymph nodes: No cervical lymphadenopathy. Vascular: Major vascular structures in the neck and at the skullbase remain normally enhancing, including both internal jugular veins. Note that there is a partially retropharyngeal course of both carotid arteries. There is bulky calcified atherosclerosis at the carotid bifurcations worse on the left. Calcified atherosclerosis at the skull base. Limited intracranial: Negative. Visualized orbits: No acute findings. Mastoids and visualized paranasal sinuses: Well pneumatized. Skeleton: C3-C4 and C4-C5 ACDF hardware appears intact. Anterolisthesis of C2 on C3 measuring 2-3 mm appears stable from the preoperative MRI. Stable mild anterolisthesis at the cervicothoracic junction. Chronic appearing T4 compression fracture. Previous median sternotomy. Upper chest:  Mild pulmonary septal thickening. Negative superior mediastinum aside from Calcified aortic atherosclerosis. No pleural effusions. IMPRESSION: 1. Prevertebral vs.  retropharyngeal anteriorly bulging fluid collection measuring 15 x 29 x 47 mm (AP by transverse by CC). This appears to communicate with a second serpiginous fluid collection which tracks laterally into the left neck along the surgical approach (series 3, image 58). These could be postoperative hematomas or seromas, although surrounding thickened or enhancing soft tissue rim raises the possibility of abscess. 2. Associated effacement of the lower pharynx. Associated pharyngeal mucosal edema. 3. NOTE ALSO a retropharyngeal course of both carotid arteries. 4. C3-C4 and C4-C5 ACDF hardware is intact. Stable cervical spine alignment. 5.  Calcified aortic atherosclerosis. 6. Chronic appearing T4 compression fracture. Electronically Signed   By: Genevie Ann M.D.   On: 03/13/2017 11:53   Ct Angio Chest Pe W And/or Wo Contrast  Result Date: 03/13/2017 CLINICAL DATA:  Neck surgery last week with throat swelling around incision. EXAM: CT ANGIOGRAPHY CHEST WITH CONTRAST TECHNIQUE: Multidetector CT imaging of the chest was performed using the standard protocol during bolus administration of intravenous contrast. Multiplanar CT image reconstructions and MIPs were obtained to evaluate the vascular anatomy. CONTRAST:  75 cc Isovue 370 COMPARISON:  05/10/2007. FINDINGS: Cardiovascular: The heart is enlarged. Patient is status post CABG. No pericardial effusion. Atherosclerotic calcification is noted in the wall of the thoracic aorta. No filling defect in the opacified pulmonary arteries to suggest the presence of an acute pulmonary embolus. Mediastinum/Nodes: No mediastinal lymphadenopathy. There is no hilar lymphadenopathy. The esophagus has normal imaging features. Tiny hiatal hernia noted. There is no axillary lymphadenopathy. Lungs/Pleura: There is some mucus in the left mainstem bronchus. Chronic interstitial changes noted bilaterally. Asymmetric elevation right hemidiaphragm with some adjacent atelectasis or scarring. No  airspace pulmonary edema or pleural effusion. 3.1 x 4.1 x 1.7 cm masslike opacities identified posterior right costophrenic sulcus (see image 61 series 7). Upper Abdomen: Unremarkable. Musculoskeletal: T10 compression fracture present since chest x-ray 06/04/2013. Review of the MIP images confirms the above findings. IMPRESSION: 1. No CT evidence for acute pulmonary embolus. 2. 3.1 x 4.1 cm masslike opacity posterior right costophrenic sulcus. Lesion is more focal than typically seen for pneumonia. Rounded atelectasis is a consideration but neoplasm could have this appearance. PET-CT may prove helpful to further evaluate. Electronically Signed   By: Misty Stanley M.D.   On: 03/13/2017 11:57    Assessment/Plan: Postop day #7, cervical hematoma, dysphagia: The patient continues to improve.  A. fib with rapid ventricular response: The patient's cardiologist is Dr. Ubaldo Glassing in [place]. Holidays to come cardiologist to see him and make recommendations regarding his medications to see if we can get a  better heart rate.[name] He takes aspirin for his A. fib but at this point we need to hold off on resuming his aspirin because of the cervical hematoma.  He will likely go home tomorrow.  LOS: 1 day     Ravyn Nikkel D 03/14/2017, 6:45 AM

## 2017-03-14 NOTE — Discharge Summary (Signed)
Physician Discharge Summary  Patient ID: [name]. MRN: [id number] DOB/AGE: 1929-11-16 83 y.o.  Admit date: 03/13/2017 Discharge date: 03/14/2017  Admission Diagnoses:cervical hematoma, dysphagia, atrial fibrillation  Discharge Diagnoses: the same Principal Problem:   Hematoma of neck Active Problems:   Dysphagia   PAF (paroxysmal atrial fibrillation) ([place]), RVR   Chronic systolic heart failure ([place])   Discharged Condition: good  Hospital Course: I observed the patient ecchymosis, hospital secondary to dysphagia and a cervical hematoma.  The patient's hospital course was remarkable only for a bout of atrial fibrillation.  Dr. Burt [name] saw the patient.  The patient converted to a sinus rhythm.  Arrangements for made for him to follow-up with his cardiologist, Dr. Ubaldo [name], and [facility].  On 03/14/17 the patient looked and felt much better.  The patient, and his wife, requested that he be discharged home.  He was given written discharge instructions and all his questions were answered.  Consults:cardiology Significant Diagnostic Studies:neck CT Treatments:observation Discharge Exam: Blood pressure 135/67, pulse 80, temperature 97.7 F (36.5 C), temperature source Oral, resp. rate 18, height '5\' 8"'$  (1.727 m), weight 85.2 kg (187 lb 13.3 oz), SpO2 94 %. The patient is alert and pleasant.  He looks well.  His cervical incision has some ecchymosis and a small to moderate palpable hematoma.  There is no midline shift.  His strength is normal.  Disposition: home  Discharge Instructions    Call MD for:  difficulty breathing, headache or visual disturbances    Complete by:  As directed    Call MD for:  extreme fatigue    Complete by:  As directed    Call MD for:  hives    Complete by:  As directed    Call MD for:  persistant dizziness or light-headedness    Complete by:  As directed    Call MD for:  persistant nausea and vomiting    Complete by:  As directed    Call MD for:   redness, tenderness, or signs of infection (pain, swelling, redness, odor or green/yellow discharge around incision site)    Complete by:  As directed    Call MD for:  severe uncontrolled pain    Complete by:  As directed    Call MD for:  temperature >100.4    Complete by:  As directed    Diet - low sodium heart healthy    Complete by:  As directed    Discharge instructions    Complete by:  As directed    Call [phone] for a followup appointment. Take a stool softener while you are using pain medications.   Driving Restrictions    Complete by:  As directed    Do not drive for 2 weeks.   Increase activity slowly    Complete by:  As directed    Lifting restrictions    Complete by:  As directed    Do not lift more than 5 pounds. No excessive bending or twisting.   May shower / Bathe    Complete by:  As directed    He may shower after the pain she is removed 3 days after surgery. Leave the incision alone.   No dressing needed    Complete by:  As directed      Allergies as of 03/14/2017      Reactions   Indomethacin Nausea Only      Medication List    STOP taking these medications   aspirin EC 325 MG tablet  OMEGA-3 FATTY ACIDS PO     TAKE these medications   atorvastatin 80 MG tablet Commonly known as:  LIPITOR Take 1 tablet (80 mg total) by mouth daily. What changed:  when to take this   azelastine 0.05 % ophthalmic solution Commonly known as:  OPTIVAR Apply 1 drop to eye 2 (two) times daily. What changed:  when to take this  reasons to take this   benazepril-hydrochlorthiazide 20-12.5 MG tablet Commonly known as:  LOTENSIN HCT Take 1 tablet by mouth daily.   beta carotene w/minerals tablet Take 1 tablet by mouth daily.   budesonide-formoterol 160-4.5 MCG/ACT inhaler Commonly known as:  SYMBICORT Inhale 2 puffs into the lungs 2 (two) times daily.   docusate sodium 100 MG capsule Commonly known as:  COLACE Take 1 capsule (100 mg total) by mouth 2  (two) times daily.   fluticasone 50 MCG/ACT nasal [name] Commonly known as:  FLONASE Place 2 sprays into both nostrils daily. What changed:  when to take this   furosemide 20 MG tablet Commonly known as:  LASIX Take 1 tablet (20 mg total) by mouth daily. What changed:  Another medication with the same name was added. Make sure you understand how and when to take each.   furosemide 20 MG tablet Commonly known as:  LASIX Take 1 tablet (20 mg total) by mouth daily. Start taking on:  03/15/2017 What changed:  You were already taking a medication with the same name, and this prescription was added. Make sure you understand how and when to take each.   lansoprazole 30 MG capsule Commonly known as:  PREVACID TAKE 1 CAPSULE EVERY DAY   LORazepam 1 MG tablet Commonly known as:  ATIVAN TAKE ONE TABLET BY MOUTH AT BEDTIME AS NEEDED FOR ANXIETY What changed:  See the new instructions.   Magnesium 400 MG Caps Take 1 capsule by mouth daily.   metoprolol 50 MG tablet Commonly known as:  LOPRESSOR Take 1 tablet (50 mg total) by mouth 2 (two) times daily.   montelukast 10 MG tablet Commonly known as:  SINGULAIR TAKE 1 TABLET BY MOUTH DAILY What changed:  See the new instructions.   MULTIPLE VITAMIN PO Take 1 tablet by mouth daily.   oxyCODONE 5 MG immediate release tablet Commonly known as:  Oxy IR/ROXICODONE Take 1-2 tablets (5-10 mg total) by mouth every 3 (three) hours as needed for breakthrough pain.   SPIRIVA HANDIHALER 18 MCG inhalation capsule Generic drug:  tiotropium INHALE 1 CAPSULE AS DIRECTED ONCE A DAY   tamsulosin 0.4 MG Caps capsule Commonly known as:  FLOMAX Take 1 capsule (0.4 mg total) by mouth daily.   tiZANidine 4 MG tablet Commonly known as:  ZANAFLEX TAKE ONE TABLET AT BEDTIME What changed:  See the new instructions.      Follow-up Information    James Spray, MD. Schedule an appointment as soon as possible for a visit.   Specialty:   Cardiology Contact information: [place] [address] [phone]           Signed: Ophelia Charter 03/14/2017, 3:35 PM

## 2017-03-16 ENCOUNTER — Ambulatory Visit: Payer: Medicare Other

## 2017-03-19 ENCOUNTER — Encounter: Payer: Self-pay | Admitting: Family Medicine

## 2017-03-19 ENCOUNTER — Ambulatory Visit (INDEPENDENT_AMBULATORY_CARE_PROVIDER_SITE_OTHER): Payer: Medicare Other | Admitting: Family Medicine

## 2017-03-19 ENCOUNTER — Ambulatory Visit: Payer: Medicare Other

## 2017-03-19 VITALS — BP 116/58 | HR 60 | Temp 97.6°F | Resp 22 | Wt 188.0 lb

## 2017-03-19 DIAGNOSIS — S1093XD Contusion of unspecified part of neck, subsequent encounter: Secondary | ICD-10-CM | POA: Diagnosis not present

## 2017-03-19 DIAGNOSIS — Z981 Arthrodesis status: Secondary | ICD-10-CM | POA: Diagnosis not present

## 2017-03-19 NOTE — Progress Notes (Signed)
Patient: James KESSNER Sr. Male    DOB: 14-Jun-1929   83 y.o.   MRN: [id number] Visit Date: 03/19/2017  Today's Provider: Vernie Murders, PA   Chief Complaint  Patient presents with  . Follow-up    ER   Subjective:    HPI     Follow up ER visit  Patient was seen in ER for SOB, constipation, leg swelling on 03/13/2017. He was treated for dyspnea and post-op swelling. Treatment for this included checking labs, imaging. CT showed post-op hematoma. He reports this condition is Improved. Pt reports his breathing is "a little better today". Pt's leg is swelling is improved with Lasix. Pt had a C3-4 and C4-5 anterior cervical discectomy, fusion, and plating performed on 03/07/2017. This pain is relieved by oxycodone 5 mg. Pt is now c/o bilateral shoulder pain, right more so than left. Pt is also c/o dysphagia. Pt is unable to eat due to the dysphagia.  ------------------------------------------------------------------------------------  Past Medical History:  Diagnosis Date  . Arthritis   . Back pain   . CAD (coronary artery disease)   . Cataract    right  . COPD (chronic obstructive pulmonary disease) ([place])    SPiriva and SYmbicort daily. Albuterol as needed  . Diverticulosis   . Dyspnea    with exertion  . Enlarged prostate    takes Flomax daily  . GERD (gastroesophageal reflux disease)   . History of chicken pox   . History of gout   . History of measles   . History of mumps   . HOH (hard of hearing)   . Hyperglycemia   . Hyperlipidemia    takes Atorvastatin daily  . Hypertension    takes Metoprolol daily as well as Lotensin HCT  . Hypotension   . Joint pain   . Microscopic colitis   . PAF (paroxysmal atrial fibrillation) ([place]), RVR 03/14/2017  . Prostate cancer ([place])   . TIA (transient ischemic attack)   . Weakness    numbness and tingling.mainly on right   Past Surgical History:  Procedure Laterality Date  . ANTERIOR CERVICAL DECOMP/DISCECTOMY FUSION  N/A 03/07/2017   Procedure: ANTERIOR CERVICAL DECOMPRESSION/DISCECTOMY FUSION CERVICAL THREE- CERVICAL FOUR, CERVICAL FOUR- CERVICAL FIVE;  Surgeon: Newman Pies, MD;  Location: [facility];  Service: Neurosurgery;  Laterality: N/A;  ANTERIOR CERVICAL DECOMPRESSION/DISCECTOMY FUSION CERVICAL 3- CERVICAL 4, CERVIACL 4- CERVICAL 5  . APPENDECTOMY  1950  . CARDIAC CATHETERIZATION  05/29/2013   EF=40-45%. Moderate pulmonary hypertension. Infero/ lateral hypokinesis  . cataract surgery Left   . COLONOSCOPY    . CORONARY ARTERY BYPASS GRAFT  2011   x 5  . MRI BRAIN  06/07/2013   Mild chronic involutional changes. No acute abnormalities  . MRI of neck    . Myocardial Perfusion Scan  05/29/2013   Abnormal myocardial perfusion image consistent with myocardial infarction  . PROSTATE SURGERY  11/18/2012   radiation seed  . TOTAL HIP ARTHROPLASTY Left 1997   hip joint replacement   Family History  Problem Relation Age of Onset  . Heart attack Mother   . Stroke Father   . Heart attack Father   . Hypertension Father   . Heart attack Sister   . Bladder Cancer Brother   . Kidney cancer Brother   . Heart Problems Brother     Allergies  Allergen Reactions  . Indomethacin Nausea Only     Current Outpatient Prescriptions:  .  atorvastatin (LIPITOR) 80 MG tablet,  Take 1 tablet (80 mg total) by mouth daily. (Patient taking differently: Take 80 mg by mouth daily at 6 PM. ), Disp: 90 tablet, Rfl: 4 .  azelastine (OPTIVAR) 0.05 % ophthalmic solution, Apply 1 drop to eye 2 (two) times daily., Disp: 6 mL, Rfl: 5 .  benazepril-hydrochlorthiazide (LOTENSIN HCT) 20-12.5 MG tablet, Take 1 tablet by mouth daily., Disp: 90 tablet, Rfl: 3 .  beta carotene w/minerals (OCUVITE) tablet, Take 1 tablet by mouth daily., Disp: , Rfl:  .  budesonide-formoterol (SYMBICORT) 160-4.5 MCG/ACT inhaler, Inhale 2 puffs into the lungs 2 (two) times daily., Disp: 1 Inhaler, Rfl: 12 .  docusate sodium (COLACE) 100 MG capsule,  Take 1 capsule (100 mg total) by mouth 2 (two) times daily., Disp: 60 capsule, Rfl: 0 .  fluticasone (FLONASE) 50 MCG/ACT nasal spray, Place 2 sprays into both nostrils daily. (Patient taking differently: Place 2 sprays into both nostrils every evening. ), Disp: 48 g, Rfl: 5 .  furosemide (LASIX) 20 MG tablet, Take 1 tablet (20 mg total) by mouth daily., Disp: 30 tablet, Rfl: 0 .  lansoprazole (PREVACID) 30 MG capsule, TAKE 1 CAPSULE EVERY DAY, Disp: 30 capsule, Rfl: 6 .  LORazepam (ATIVAN) 1 MG tablet, TAKE ONE TABLET BY MOUTH AT BEDTIME AS NEEDED FOR ANXIETY (Patient taking differently: TAKE ONE TABLET BY MOUTH AT BEDTIME), Disp: 30 tablet, Rfl: 4 .  Magnesium 400 MG CAPS, Take 1 capsule by mouth daily. , Disp: , Rfl:  .  metoprolol (LOPRESSOR) 50 MG tablet, Take 1 tablet (50 mg total) by mouth 2 (two) times daily., Disp: 180 tablet, Rfl: 4 .  montelukast (SINGULAIR) 10 MG tablet, TAKE 1 TABLET BY MOUTH DAILY (Patient taking differently: TAKE 1 TABLET BY MOUTH DAILY IN THE EVENING), Disp: 30 tablet, Rfl: 12 .  MULTIPLE VITAMIN PO, Take 1 tablet by mouth daily. , Disp: , Rfl:  .  oxyCODONE (OXY IR/ROXICODONE) 5 MG immediate release tablet, Take 1-2 tablets (5-10 mg total) by mouth every 3 (three) hours as needed for breakthrough pain., Disp: 50 tablet, Rfl: 0 .  SPIRIVA HANDIHALER 18 MCG inhalation capsule, INHALE 1 CAPSULE AS DIRECTED ONCE A DAY, Disp: 30 capsule, Rfl: 12 .  tamsulosin (FLOMAX) 0.4 MG CAPS capsule, Take 1 capsule (0.4 mg total) by mouth daily., Disp: 90 capsule, Rfl: 4 .  tiZANidine (ZANAFLEX) 4 MG tablet, TAKE ONE TABLET AT BEDTIME (Patient taking differently: TAKE ONE TABLET AT BEDTIME AS NEEDED FOR SPASMS), Disp: 30 tablet, Rfl: 12  Review of Systems  Constitutional: Positive for activity change, appetite change and fatigue. Negative for chills, diaphoresis, fever and unexpected weight change.  HENT: Positive for trouble swallowing.   Respiratory: Positive for shortness of  breath (improved).   Cardiovascular: Positive for leg swelling (improved). Negative for chest pain and palpitations.  Musculoskeletal: Positive for arthralgias and neck pain.  Psychiatric/Behavioral: Positive for sleep disturbance.    Social History  Substance Use Topics  . Smoking status: Former Smoker    Packs/day: 1.00    Years: 11.00    Types: Cigarettes  . Smokeless tobacco: Never Used     Comment: quit smoking 30 yrs ago  . Alcohol use 0.0 oz/week     Comment: beer occasionally   Objective:   BP (!) 116/58 (BP Location: Right Arm, Patient Position: Sitting, Cuff Size: Large)   Pulse 60   Temp 97.6 F (36.4 C) (Oral)   Resp (!) 22   Wt 188 lb (85.3 kg)   SpO2 98%  BMI 28.59 kg/m  Vitals:   03/19/17 1333  BP: (!) 116/58  Pulse: 60  Resp: (!) 22  Temp: 97.6 F (36.4 C)  TempSrc: Oral  SpO2: 98%  Weight: 188 lb (85.3 kg)     Physical Exam  Constitutional: He is oriented to person, place, and time. He appears well-developed and well-nourished.  HENT:  Head: Normocephalic.  Right Ear: External ear normal.  Left Ear: External ear normal.  Mouth/Throat: Oropharynx is clear and moist.  Eyes: Conjunctivae are normal.  Neck:  Hematoma along incision from cervical spine fusion surgery by Dr. Arnoldo Morale.  Cardiovascular: Normal rate and regular rhythm.   Pulmonary/Chest: Effort normal and breath sounds normal.  Abdominal: Soft.  Lymphadenopathy:    He has no cervical adenopathy.  Neurological: He is alert and oriented to person, place, and time.  Psychiatric: He has a normal mood and affect. His behavior is normal.      Assessment & Plan:     1. Hematoma of neck, subsequent encounter Had a follow up CT scan of neck on 03-13-17 after ACDF of C3-4 and C4-5 because he felt a lump in the incisional scar and a little difficulty swallowing. The CT scan confirmed a hematoma along the incisional line and some pharyngeal mucosa edema. Surgeon felt this was not a severe  complication and should resolve with time. Feeling better after taking his pain medication this morning. Recommend Chloroseptic throat lozenge and contact his surgeon (Dr. Arnoldo Morale) if any worsening. Can use Ensure supplements to a liquid/soft diet as this resolves.  2. S/P cervical spinal fusion Fusion of of C3-4 and C4-5 with anterior discectomy on 03-07-17. Feel improvement in bilateral shoulder discomfort. Still some slight soreness remains in neck.     Patient seen and examined by Vernie Murders, PA, and note scribed by Renaldo Fiddler, CMA.  Vernie Murders, PA  [facility]

## 2017-03-21 ENCOUNTER — Ambulatory Visit: Payer: Medicare Other

## 2017-03-23 ENCOUNTER — Ambulatory Visit: Payer: Medicare Other

## 2017-03-26 ENCOUNTER — Ambulatory Visit: Payer: Medicare Other

## 2017-03-28 ENCOUNTER — Encounter: Payer: Self-pay | Admitting: Family Medicine

## 2017-03-28 ENCOUNTER — Ambulatory Visit (INDEPENDENT_AMBULATORY_CARE_PROVIDER_SITE_OTHER): Payer: Medicare Other | Admitting: Family Medicine

## 2017-03-28 VITALS — BP 110/50 | HR 92 | Temp 98.0°F | Resp 16 | Ht 68.0 in | Wt 186.0 lb

## 2017-03-28 DIAGNOSIS — S1093XD Contusion of unspecified part of neck, subsequent encounter: Secondary | ICD-10-CM | POA: Diagnosis not present

## 2017-03-28 DIAGNOSIS — Z981 Arthrodesis status: Secondary | ICD-10-CM

## 2017-03-28 NOTE — Patient Instructions (Signed)
   Continue oxycodone/apap first thing in the morning and before bed. Try taking Extra Strength Tylenol between doses of oxycodone/apap during the day, but you can take oxycodone/apap once during the day

## 2017-03-28 NOTE — Progress Notes (Signed)
Patient: James SEDLER Sr. Male    DOB: Sep 19, 1929   83 y.o.   MRN: [id number] Visit Date: 03/28/2017  Today's Provider: Lelon Huh, MD   No chief complaint on file.  Subjective:    Patient is here to give update on his spine surgery he had on 03/07/2017. Patient had c 3-4 and 4-5 anterior cervical discectomy, fusion and plating , surgeon was Dr. Arnoldo Morale.        Allergies  Allergen Reactions  . Indomethacin Nausea Only     Current Outpatient Prescriptions:  .  atorvastatin (LIPITOR) 80 MG tablet, Take 1 tablet (80 mg total) by mouth daily. (Patient taking differently: Take 80 mg by mouth daily at 6 PM. ), Disp: 90 tablet, Rfl: 4 .  azelastine (OPTIVAR) 0.05 % ophthalmic solution, Apply 1 drop to eye 2 (two) times daily., Disp: 6 mL, Rfl: 5 .  benazepril-hydrochlorthiazide (LOTENSIN HCT) 20-12.5 MG tablet, Take 1 tablet by mouth daily., Disp: 90 tablet, Rfl: 3 .  beta carotene w/minerals (OCUVITE) tablet, Take 1 tablet by mouth daily., Disp: , Rfl:  .  budesonide-formoterol (SYMBICORT) 160-4.5 MCG/ACT inhaler, Inhale 2 puffs into the lungs 2 (two) times daily., Disp: 1 Inhaler, Rfl: 12 .  docusate sodium (COLACE) 100 MG capsule, Take 1 capsule (100 mg total) by mouth 2 (two) times daily., Disp: 60 capsule, Rfl: 0 .  fluticasone (FLONASE) 50 MCG/ACT nasal spray, Place 2 sprays into both nostrils daily. (Patient taking differently: Place 2 sprays into both nostrils every evening. ), Disp: 48 g, Rfl: 5 .  furosemide (LASIX) 20 MG tablet, Take 1 tablet (20 mg total) by mouth daily., Disp: 30 tablet, Rfl: 0 .  lansoprazole (PREVACID) 30 MG capsule, TAKE 1 CAPSULE EVERY DAY, Disp: 30 capsule, Rfl: 6 .  LORazepam (ATIVAN) 1 MG tablet, TAKE ONE TABLET BY MOUTH AT BEDTIME AS NEEDED FOR ANXIETY (Patient taking differently: TAKE ONE TABLET BY MOUTH AT BEDTIME), Disp: 30 tablet, Rfl: 4 .  Magnesium 400 MG CAPS, Take 1 capsule by mouth daily. , Disp: , Rfl:  .  metoprolol (LOPRESSOR) 50  MG tablet, Take 1 tablet (50 mg total) by mouth 2 (two) times daily., Disp: 180 tablet, Rfl: 4 .  montelukast (SINGULAIR) 10 MG tablet, TAKE 1 TABLET BY MOUTH DAILY (Patient taking differently: TAKE 1 TABLET BY MOUTH DAILY IN THE EVENING), Disp: 30 tablet, Rfl: 12 .  MULTIPLE VITAMIN PO, Take 1 tablet by mouth daily. , Disp: , Rfl:  .  SPIRIVA HANDIHALER 18 MCG inhalation capsule, INHALE 1 CAPSULE AS DIRECTED ONCE A DAY, Disp: 30 capsule, Rfl: 12 .  tamsulosin (FLOMAX) 0.4 MG CAPS capsule, Take 1 capsule (0.4 mg total) by mouth daily., Disp: 90 capsule, Rfl: 4 .  tiZANidine (ZANAFLEX) 4 MG tablet, TAKE ONE TABLET AT BEDTIME (Patient taking differently: TAKE ONE TABLET AT BEDTIME AS NEEDED FOR SPASMS), Disp: 30 tablet, Rfl: 12 .  oxyCODONE-acetaminophen (PERCOCET/ROXICET) 5-325 MG tablet, Take 1 tablet by mouth every 6 (six) weeks., Disp: , Rfl: 0  Review of Systems  Constitutional: Negative for appetite change, chills and fever.  Respiratory: Negative for chest tightness, shortness of breath and wheezing.   Cardiovascular: Negative for chest pain and palpitations.  Gastrointestinal: Negative for abdominal pain, nausea and vomiting.    Social History  Substance Use Topics  . Smoking status: Former Smoker    Packs/day: 1.00    Years: 11.00    Types: Cigarettes  . Smokeless tobacco: Never Used  Comment: quit smoking 30 yrs ago  . Alcohol use 0.0 oz/week     Comment: beer occasionally   Objective:   BP (!) 110/50 (BP Location: Right Arm, Patient Position: Sitting, Cuff Size: Large)   Pulse 92   Temp 98 F (36.7 C) (Oral)   Resp 16   Wt 186 lb (84.4 kg)   SpO2 94%   BMI 28.28 kg/m  Vitals:   03/28/17 1623  BP: (!) 110/50  Pulse: 92  Resp: 16  Temp: 98 F (36.7 C)  TempSrc: Oral  SpO2: 94%  Weight: 186 lb (84.4 kg)     Physical Exam  .Physical Exam  Constitutional: He is oriented to person, place, and time. He appears well-developed and well-nourished.  HENT:    Mouth/Throat: Oropharynx is clear and moist.  Eyes: Conjunctivae are normal.  Neck: Mild sweling along incision from cervical spine fusion surgery by Dr. Arnoldo Morale. No discoloration. No tenderness in this area. Mild tenderness along posterior neck and shoulders.  Cardiovascular: Normal rate and regular rhythm.   Pulmonary/Chest: Effort normal and breath sounds normal.     Assessment & Plan:     1. Hematoma of neck, subsequent encounter Slowly improving. This is probably responsible for difficulty swallowing and expect to continue to gradually improve  2. S/P cervical spinal fusion Slowly improving. Still take oxycodone/apap 4 times a day on a schedule. Recommend he continue taking one in morning and at bedtime, but try to get by with extra strength tylenol during day.   Call if symptoms change or if not rapidly improving.           Lelon Huh, MD  [facility]

## 2017-04-09 DIAGNOSIS — I10 Essential (primary) hypertension: Secondary | ICD-10-CM | POA: Diagnosis not present

## 2017-04-09 DIAGNOSIS — I2510 Atherosclerotic heart disease of native coronary artery without angina pectoris: Secondary | ICD-10-CM | POA: Diagnosis not present

## 2017-04-09 DIAGNOSIS — E782 Mixed hyperlipidemia: Secondary | ICD-10-CM | POA: Diagnosis not present

## 2017-04-09 DIAGNOSIS — I480 Paroxysmal atrial fibrillation: Secondary | ICD-10-CM | POA: Diagnosis not present

## 2017-04-17 DIAGNOSIS — M4712 Other spondylosis with myelopathy, cervical region: Secondary | ICD-10-CM | POA: Diagnosis not present

## 2017-04-17 DIAGNOSIS — M4312 Spondylolisthesis, cervical region: Secondary | ICD-10-CM | POA: Diagnosis not present

## 2017-04-23 NOTE — Progress Notes (Signed)
[facility] Consultation      Assessment and Plan:  The patient is a 83 year old male with chronic progressive dyspnea, which is thought to be multifactorial due to COPD, systolic congestive heart failure, elevated hemidiaphragm.  COPD.  --Continue spiriva, symbicort.  --Would try to do some low impact activity such as slow pace walking for 20 min.   --s/p Prevnar-13 vaccine if not given already.   Deconditioning/Debility.  --Recommend pulmonary rehab, but he is currently going through PT, and would like to hold off until he completes PT in November.   Elevated right diaphragm.  --Present for many years, likely contributing to dyspnea.   Chronic systolic cardiac dysfunction.  --Stable EF of 40%.   Dyspnea. --Dyspnea is likely multifactorial due to all of the above issues.  --I will prescribe an albuterol MDI to use with exercise.   Date: 04/23/2017  MRN# [id number] James VIVERO Sr. Feb 17, 1929   James Croissant Sr. is a 83 y.o. old male seen in consultation for chief complaint of:    Chief Complaint  Patient presents with  . COPD    Pt reports breathing much better. He had surgery and was not able to eat as much therefore lost weight. Sob has improved greatly.Patient denies cough, chest tightness,wheeze.    HPI:   The patient is a 83 year old male with chronic progressive dyspnea, which is thought to be multifactorial due to COPD, systolic congestive heart failure, elevated hemidiaphragm. He notes that he has had trouble breathing progressively for the past 15 years.  He notes that his weight has increased about 25 lbs in that time. He used to smoke last about 40 years ago. He has noted about ppd.   He is using spiriva and symbicort inhalers, which helped. He notes that his throat is sore. He is rinsing his mouth after use.  Since his last visit he had spinal surgery and has lost about 25 lbs. With this he notes a significant improvement in his breathing. He  feels that bis breathing has been doing much better. He can walk to the mailbox and back without huffing and puffing. He has not had to use his rescue inhaler.  He has been having a lot of pain after the surgery and has been limited in his activity.     He has cats and dogs at home, seldom in the bedroom.  Review of Dr. Bethanne Ginger records: He is noted to have controlled Afib, most recent echocardiogram showed stable systolic function of about 40%.   **CXR  05/22/16; elevated right diaphragm, stable for several years.   **Spirometryv10/12/17; restriction with FEV1=48%  Medication:    Reviewed.     Allergies:  Indomethacin  Review of Systems: Gen:  Denies  fever, sweats, chills HEENT: Denies blurred vision, double vision. bleeds, sore throat Cvc:  No dizziness, chest pain. Resp:   Denies cough or sputum production, Gi: Denies swallowing difficulty, stomach pain. Gu:  Denies bladder incontinence, burning urine Ext:   No Joint pain, stiffness. Skin: No skin rash,  hives  Endoc:  No polyuria, polydipsia. Psych: No depression, insomnia. Other:  All other systems were reviewed with the patient and were negative other that what is mentioned in the HPI.   Physical Examination:   VS: BP 98/60 (BP Location: Right Arm, Patient Position: Sitting, Cuff Size: Normal)   Pulse 78   Resp 16   Ht '5\' 8"'$  (1.727 m)   Wt 183 lb (83 kg)   SpO2 97%  BMI 27.83 kg/m   General Appearance: No distress  Neuro:without focal findings,  speech normal,  HEENT: PERRLA, EOM intact.   Pulmonary: normal breath sounds, No wheezing. Decreased air entry bilaterally.   CardiovascularNormal S1,S2.  No m/r/g.   Abdomen: Benign, Soft, non-tender. Renal:  No costovertebral tenderness  GU:  No performed at this time. Endoc: No evident thyromegaly, no signs of acromegaly. Skin:   warm, no rashes, no ecchymosis  Extremities: normal, no cyanosis, clubbing.  Other findings:    LABORATORY PANEL:   CBC No results  for input(s): WBC, HGB, HCT, PLT in the last 168 hours. ------------------------------------------------------------------------------------------------------------------  Chemistries  No results for input(s): NA, K, CL, CO2, GLUCOSE, BUN, CREATININE, CALCIUM, MG, AST, ALT, ALKPHOS, BILITOT in the last 168 hours.  Invalid input(s): GFRCGP ------------------------------------------------------------------------------------------------------------------  Cardiac Enzymes No results for input(s): TROPONINI in the last 168 hours. ------------------------------------------------------------  RADIOLOGY:  No results found.     Thank  you for the consultation and for allowing [facility] to assist in the care of your patient. Our recommendations are noted above.  Please contact us if we can be of further service.   Marda Stalker, MD.  Board Certified in Internal Medicine, Pulmonary Medicine, [place], and Sleep Medicine.  [facility] Office Number: [phone]  Patricia Pesa, M.D.  Vilinda Boehringer, M.D.  Merton Border, M.D  04/23/2017

## 2017-04-25 ENCOUNTER — Ambulatory Visit (INDEPENDENT_AMBULATORY_CARE_PROVIDER_SITE_OTHER): Payer: Medicare Other | Admitting: Internal Medicine

## 2017-04-25 ENCOUNTER — Encounter: Payer: Self-pay | Admitting: Internal Medicine

## 2017-04-25 ENCOUNTER — Ambulatory Visit: Payer: Medicare Other | Admitting: Internal Medicine

## 2017-04-25 VITALS — BP 98/60 | HR 78 | Resp 16 | Ht 68.0 in | Wt 183.0 lb

## 2017-04-25 DIAGNOSIS — J449 Chronic obstructive pulmonary disease, unspecified: Secondary | ICD-10-CM

## 2017-04-25 NOTE — Patient Instructions (Addendum)
--  We can try a trial off the symbicort for 2 weeks, then restart and try 2 weeks off the spiriva. Continue whichever one you feel better on. If you do not feel as well, then go back on both.

## 2017-05-07 ENCOUNTER — Other Ambulatory Visit: Payer: Self-pay | Admitting: *Deleted

## 2017-05-07 MED ORDER — LANSOPRAZOLE 30 MG PO CPDR: 30.0000 mg | DELAYED_RELEASE_CAPSULE | Freq: Every day | ORAL | 3 refills | Status: DC

## 2017-05-17 ENCOUNTER — Encounter: Payer: Self-pay | Admitting: Family Medicine

## 2017-05-17 ENCOUNTER — Ambulatory Visit (INDEPENDENT_AMBULATORY_CARE_PROVIDER_SITE_OTHER): Payer: Medicare Other | Admitting: Family Medicine

## 2017-05-17 VITALS — BP 100/52 | HR 58 | Temp 97.5°F | Resp 16 | Wt 181.0 lb

## 2017-05-17 DIAGNOSIS — Z981 Arthrodesis status: Secondary | ICD-10-CM | POA: Diagnosis not present

## 2017-05-17 DIAGNOSIS — M542 Cervicalgia: Secondary | ICD-10-CM

## 2017-05-17 MED ORDER — HYDROCODONE-ACETAMINOPHEN 10-325 MG PO TABS: 1.0000 | ORAL_TABLET | Freq: Four times a day (QID) | ORAL | 0 refills | Status: DC | PRN

## 2017-05-17 MED ORDER — PREDNISONE 10 MG PO TABS: ORAL_TABLET | ORAL | 0 refills | Status: AC

## 2017-05-17 NOTE — Progress Notes (Signed)
Patient: James DUMLAO Sr. Male    DOB: 1929-10-02   83 y.o.   MRN: [id number] Visit Date: 05/17/2017  Today's Provider: Lelon Huh, MD   No chief complaint on file.  Subjective:    HPI  Past Surgical History:  Procedure Laterality Date  . ANTERIOR CERVICAL DECOMP/DISCECTOMY FUSION N/A 03/07/2017   Procedure: ANTERIOR CERVICAL DECOMPRESSION/DISCECTOMY FUSION CERVICAL THREE- CERVICAL FOUR, CERVICAL FOUR- CERVICAL FIVE;  Surgeon: Newman Pies, MD;  Location: [place];  Service: Neurosurgery;  Laterality: N/A;  ANTERIOR CERVICAL DECOMPRESSION/DISCECTOMY FUSION CERVICAL 3- CERVICAL 4, CERVIACL 4- CERVICAL 5    He states he continues to have persistent pain in neck since surgery above. Course was complicated by hematoma requiring hospitalization. He had been prescribed oxycodone and oxycodone/apap by Dr. Arnoldo Morale, but this was recently changed to tramadol due to constipation with oxycodone, which he didn't feel was helping much anyway. He states that if he stays perfectly still that he is comfortably, but any movement will cause moderate to severe pain mid cervical spine which has significantly limited his activity. He states tramadol is not providing much relief at all. He states he is not scheduled to follow up with Dr.Jenkins for 6 months.    Allergies  Allergen Reactions  . Indomethacin Nausea Only     Current Outpatient Prescriptions:  .  atorvastatin (LIPITOR) 80 MG tablet, Take 1 tablet (80 mg total) by mouth daily. (Patient taking differently: Take 80 mg by mouth daily at 6 PM. ), Disp: 90 tablet, Rfl: 4 .  azelastine (OPTIVAR) 0.05 % ophthalmic solution, Apply 1 drop to eye 2 (two) times daily., Disp: 6 mL, Rfl: 5 .  benazepril-hydrochlorthiazide (LOTENSIN HCT) 20-12.5 MG tablet, Take 1 tablet by mouth daily., Disp: 90 tablet, Rfl: 3 .  beta carotene w/minerals (OCUVITE) tablet, Take 1 tablet by mouth daily., Disp: , Rfl:  .  budesonide-formoterol (SYMBICORT) 160-4.5  MCG/ACT inhaler, Inhale 2 puffs into the lungs 2 (two) times daily., Disp: 1 Inhaler, Rfl: 12 .  docusate sodium (COLACE) 100 MG capsule, Take 1 capsule (100 mg total) by mouth 2 (two) times daily., Disp: 60 capsule, Rfl: 0 .  fluticasone (FLONASE) 50 MCG/ACT nasal spray, Place 2 sprays into both nostrils daily. (Patient taking differently: Place 2 sprays into both nostrils every evening. ), Disp: 48 g, Rfl: 5 .  furosemide (LASIX) 20 MG tablet, Take 1 tablet (20 mg total) by mouth daily., Disp: 30 tablet, Rfl: 0 .  lansoprazole (PREVACID) 30 MG capsule, Take 1 capsule (30 mg total) by mouth daily., Disp: 30 capsule, Rfl: 3 .  LORazepam (ATIVAN) 1 MG tablet, TAKE ONE TABLET BY MOUTH AT BEDTIME AS NEEDED FOR ANXIETY (Patient taking differently: TAKE ONE TABLET BY MOUTH AT BEDTIME), Disp: 30 tablet, Rfl: 4 .  Magnesium 400 MG CAPS, Take 1 capsule by mouth daily. , Disp: , Rfl:  .  metoprolol (LOPRESSOR) 50 MG tablet, Take 1 tablet (50 mg total) by mouth 2 (two) times daily., Disp: 180 tablet, Rfl: 4 .  montelukast (SINGULAIR) 10 MG tablet, TAKE 1 TABLET BY MOUTH DAILY (Patient taking differently: TAKE 1 TABLET BY MOUTH DAILY IN THE EVENING), Disp: 30 tablet, Rfl: 12 .  MULTIPLE VITAMIN PO, Take 1 tablet by mouth daily. , Disp: , Rfl:  .  SPIRIVA HANDIHALER 18 MCG inhalation capsule, INHALE 1 CAPSULE AS DIRECTED ONCE A DAY, Disp: 30 capsule, Rfl: 12 .  tamsulosin (FLOMAX) 0.4 MG CAPS capsule, Take 1 capsule (0.4 mg  total) by mouth daily., Disp: 90 capsule, Rfl: 4 .  tiZANidine (ZANAFLEX) 4 MG tablet, TAKE ONE TABLET AT BEDTIME (Patient taking differently: TAKE ONE TABLET AT BEDTIME AS NEEDED FOR SPASMS), Disp: 30 tablet, Rfl: 12 .  traMADol (ULTRAM) 50 MG tablet, Take 50 mg by mouth daily as needed., Disp: , Rfl: 0  Review of Systems  Constitutional: Negative for appetite change, chills and fever.  Respiratory: Negative for chest tightness, shortness of breath and wheezing.   Cardiovascular: Negative  for chest pain and palpitations.  Gastrointestinal: Negative for abdominal pain, nausea and vomiting.  Musculoskeletal: Positive for neck stiffness.    Social History  Substance Use Topics  . Smoking status: Former Smoker    Packs/day: 1.00    Years: 11.00    Types: Cigarettes  . Smokeless tobacco: Never Used     Comment: quit smoking 30 yrs ago  . Alcohol use 0.0 oz/week     Comment: beer occasionally   Objective:   BP (!) 100/52 (BP Location: Right Arm, Patient Position: Sitting, Cuff Size: Large)   Pulse (!) 58   Temp 97.5 F (36.4 C) (Oral)   Resp 16   Wt 181 lb (82.1 kg)   SpO2 96%   BMI 27.52 kg/m  Vitals:   05/17/17 0952  BP: (!) 100/52  Pulse: (!) 58  Resp: 16  Temp: 97.5 F (36.4 C)  TempSrc: Oral  SpO2: 96%  Weight: 181 lb (82.1 kg)     Physical Exam  General appearance: alert, well developed, well nourished, cooperative and in no distress Head: Normocephalic, without obvious abnormality, atraumatic Respiratory: Respirations even and unlabored, normal respiratory rate Extremities: No gross deformities MS: Tender mid cervical spine. No gross deformities. No erythema or swelling.      Assessment & Plan:     1. S/P cervical spinal fusion   2. Neck pain Try to cool down inflammation with prednisone course, and prn hydrocodone/apap counseled hydrocodone will likely cause constipation and to go ahead and start daily stool softener and fiber supplements.  - predniSONE (DELTASONE) 10 MG tablet; 6 tablets for 2 days, then 5 for 2 days, then 4 for 2 days, then 3 for 2 days, then 2 for 2 days, then 1 for 2 days.  Dispense: 42 tablet; Refill: 0 - HYDROcodone-acetaminophen (NORCO) 10-325 MG tablet; Take 1 tablet by mouth every 6 (six) hours as needed.  Dispense: 30 tablet; Refill: 0  His wife is a patient of Dr. Sharlet Salina and inquired whether he might be helpful. Will see how he responds to above treatment and consider referral if not improving in a week or two.           Lelon Huh, MD  [facility]

## 2017-05-30 ENCOUNTER — Telehealth: Payer: Self-pay | Admitting: Family Medicine

## 2017-05-30 NOTE — Telephone Encounter (Signed)
Pt states he rec'd a Rx for prednisone for neck pain.  Pt states he has taken all of the medication and it did help.  Pt states Dr Caryn Section ask pt to call and let him know how it worked.  YH#[phone]/AS

## 2017-06-04 DIAGNOSIS — H353111 Nonexudative age-related macular degeneration, right eye, early dry stage: Secondary | ICD-10-CM | POA: Diagnosis not present

## 2017-06-18 ENCOUNTER — Other Ambulatory Visit: Payer: Self-pay | Admitting: *Deleted

## 2017-06-18 MED ORDER — LORAZEPAM 1 MG PO TABS: ORAL_TABLET | ORAL | 4 refills | Status: DC

## 2017-06-18 NOTE — Telephone Encounter (Signed)
rx called in-aa 

## 2017-06-18 NOTE — Telephone Encounter (Signed)
Please call in lorazepam.  

## 2017-07-10 ENCOUNTER — Ambulatory Visit (INDEPENDENT_AMBULATORY_CARE_PROVIDER_SITE_OTHER): Payer: Medicare Other | Admitting: Family Medicine

## 2017-07-10 ENCOUNTER — Encounter: Payer: Self-pay | Admitting: Family Medicine

## 2017-07-10 VITALS — BP 140/72 | HR 82 | Temp 98.5°F | Resp 16 | Wt 189.0 lb

## 2017-07-10 DIAGNOSIS — R609 Edema, unspecified: Secondary | ICD-10-CM

## 2017-07-10 DIAGNOSIS — I5022 Chronic systolic (congestive) heart failure: Secondary | ICD-10-CM | POA: Diagnosis not present

## 2017-07-10 DIAGNOSIS — M542 Cervicalgia: Secondary | ICD-10-CM | POA: Diagnosis not present

## 2017-07-10 MED ORDER — HYDROCODONE-ACETAMINOPHEN 10-325 MG PO TABS: 1.0000 | ORAL_TABLET | Freq: Four times a day (QID) | ORAL | 0 refills | Status: DC | PRN

## 2017-07-10 MED ORDER — BENAZEPRIL-HYDROCHLOROTHIAZIDE 20-12.5 MG PO TABS: 0.5000 | ORAL_TABLET | Freq: Every day | ORAL | 0 refills | Status: DC

## 2017-07-10 NOTE — Progress Notes (Signed)
Patient: James SHERROW Sr. Male    DOB: [birth year]   83 y.o.   MRN: [id number] Visit Date: 07/10/2017  Today's Provider: Lelon Huh, MD   Chief Complaint  Patient presents with  . Foot Swelling    x 2 weeks    Subjective:    HPI Foot swelling: Patient comes in today reporting that he has had swelling in both feet for the past 2 weeks. Swelling is worse in the left foot. Patient denies any pain or redness in his feet. Patient states he stopped taking Benazepril-HCTZ three weeks ago due to it causing dizziness.  He states he was started on furosemide by Dr. Ubaldo Glassing in April    Allergies  Allergen Reactions  . Indomethacin Nausea Only     Current Outpatient Prescriptions:  .  atorvastatin (LIPITOR) 80 MG tablet, Take 1 tablet (80 mg total) by mouth daily. (Patient taking differently: Take 80 mg by mouth daily at 6 PM. ), Disp: 90 tablet, Rfl: 4 .  azelastine (OPTIVAR) 0.05 % ophthalmic solution, Apply 1 drop to eye 2 (two) times daily., Disp: 6 mL, Rfl: 5 .  beta carotene w/minerals (OCUVITE) tablet, Take 1 tablet by mouth daily., Disp: , Rfl:  .  budesonide-formoterol (SYMBICORT) 160-4.5 MCG/ACT inhaler, Inhale 2 puffs into the lungs 2 (two) times daily., Disp: 1 Inhaler, Rfl: 12 .  fluticasone (FLONASE) 50 MCG/ACT nasal spray, Place 2 sprays into both nostrils daily. (Patient taking differently: Place 2 sprays into both nostrils every evening. ), Disp: 48 g, Rfl: 5 .  furosemide (LASIX) 20 MG tablet, Take 1 tablet (20 mg total) by mouth daily., Disp: 30 tablet, Rfl: 0 .  HYDROcodone-acetaminophen (NORCO) 10-325 MG tablet, Take 1 tablet by mouth every 6 (six) hours as needed., Disp: 30 tablet, Rfl: 0 .  lansoprazole (PREVACID) 30 MG capsule, Take 1 capsule (30 mg total) by mouth daily., Disp: 30 capsule, Rfl: 3 .  LORazepam (ATIVAN) 1 MG tablet, TAKE ONE TABLET BY MOUTH AT BEDTIME AS NEEDED, Disp: 30 tablet, Rfl: 4 .  Magnesium 400 MG CAPS, Take 1 capsule by mouth daily. ,  Disp: , Rfl:  .  metoprolol (LOPRESSOR) 50 MG tablet, Take 1 tablet (50 mg total) by mouth 2 (two) times daily., Disp: 180 tablet, Rfl: 4 .  montelukast (SINGULAIR) 10 MG tablet, TAKE 1 TABLET BY MOUTH DAILY (Patient taking differently: TAKE 1 TABLET BY MOUTH DAILY IN THE EVENING), Disp: 30 tablet, Rfl: 12 .  MULTIPLE VITAMIN PO, Take 1 tablet by mouth daily. , Disp: , Rfl:  .  SPIRIVA HANDIHALER 18 MCG inhalation capsule, INHALE 1 CAPSULE AS DIRECTED ONCE A DAY, Disp: 30 capsule, Rfl: 12 .  tamsulosin (FLOMAX) 0.4 MG CAPS capsule, Take 1 capsule (0.4 mg total) by mouth daily., Disp: 90 capsule, Rfl: 4 .  tiZANidine (ZANAFLEX) 4 MG tablet, TAKE ONE TABLET AT BEDTIME (Patient taking differently: TAKE ONE TABLET AT BEDTIME AS NEEDED FOR SPASMS), Disp: 30 tablet, Rfl: 12 .  traMADol (ULTRAM) 50 MG tablet, Take 50 mg by mouth daily as needed., Disp: , Rfl: 0 .  benazepril-hydrochlorthiazide (LOTENSIN HCT) 20-12.5 MG tablet, Take 1 tablet by mouth daily. (Patient not taking: Reported on 07/10/2017), Disp: 90 tablet, Rfl: 3 .  docusate sodium (COLACE) 100 MG capsule, Take 1 capsule (100 mg total) by mouth 2 (two) times daily. (Patient not taking: Reported on 07/10/2017), Disp: 60 capsule, Rfl: 0  Review of Systems  Constitutional: Negative for appetite change,  chills and fever.  Respiratory: Negative for chest tightness, shortness of breath and wheezing.   Cardiovascular: Positive for leg swelling (in both feet). Negative for chest pain and palpitations.  Gastrointestinal: Negative for abdominal pain, nausea and vomiting.  Musculoskeletal: Positive for neck pain.  Skin: Negative for color change.  Neurological: Negative for dizziness, numbness and headaches.    Social History  Substance Use Topics  . Smoking status: Former Smoker    Packs/day: 1.00    Years: 11.00    Types: Cigarettes  . Smokeless tobacco: Never Used     Comment: quit smoking 30 yrs ago  . Alcohol use 0.0 oz/week     Comment:  beer occasionally   Objective:   BP 140/72 (BP Location: Right Arm, Patient Position: Sitting, Cuff Size: Large)   Pulse 82   Temp 98.5 F (36.9 C) (Oral)   Resp 16   Wt 189 lb (85.7 kg)   SpO2 95% Comment: room air  BMI 28.74 kg/m     Physical Exam   General Appearance:    Alert, cooperative, no distress  Eyes:    PERRL, conjunctiva/corneas clear, EOM's intact       Lungs:     Clear to auscultation bilaterally, respirations unlabored  Heart:    Regular rate and rhythm  Ext: 1+ bipedal edema.           Assessment & Plan:     1. Edema, unspecified type Likely exacerbated by discontinuation of benazepril-hctz. Will restart at 1/2 tablet due to h/o CHF   2. Chronic systolic heart failure ([place])   3. Neck pain He requests refill - HYDROcodone-acetaminophen (NORCO) 10-325 MG tablet; Take 1 tablet by mouth every 6 (six) hours as needed.  Dispense: 30 tablet; Refill: 0       Lelon Huh, MD  [facility]

## 2017-07-10 NOTE — Patient Instructions (Signed)
   Start taking benazepril-hctz 1/2 (one-half) tablet daily   Please call if the dizziness comes back after starting back on benazepril-hctz

## 2017-07-18 ENCOUNTER — Other Ambulatory Visit: Payer: Self-pay | Admitting: Family Medicine

## 2017-07-23 ENCOUNTER — Other Ambulatory Visit: Payer: Self-pay | Admitting: Family Medicine

## 2017-08-06 ENCOUNTER — Other Ambulatory Visit: Payer: Self-pay | Admitting: Family Medicine

## 2017-08-08 ENCOUNTER — Ambulatory Visit (INDEPENDENT_AMBULATORY_CARE_PROVIDER_SITE_OTHER): Payer: Medicare Other | Admitting: Family Medicine

## 2017-08-08 ENCOUNTER — Encounter: Payer: Self-pay | Admitting: Family Medicine

## 2017-08-08 ENCOUNTER — Ambulatory Visit
Admission: RE | Admit: 2017-08-08 | Discharge: 2017-08-08 | Disposition: A | Discharge status: Home / Self Care | Payer: Medicare Other | Source: Ambulatory Visit | Attending: Family Medicine | Admitting: Family Medicine

## 2017-08-08 VITALS — BP 108/56 | HR 82 | Temp 98.9°F | Resp 18 | Wt 188.0 lb

## 2017-08-08 DIAGNOSIS — R0600 Dyspnea, unspecified: Secondary | ICD-10-CM

## 2017-08-08 DIAGNOSIS — Z951 Presence of aortocoronary bypass graft: Secondary | ICD-10-CM | POA: Insufficient documentation

## 2017-08-08 DIAGNOSIS — J40 Bronchitis, not specified as acute or chronic: Secondary | ICD-10-CM | POA: Diagnosis not present

## 2017-08-08 DIAGNOSIS — R059 Cough, unspecified: Secondary | ICD-10-CM

## 2017-08-08 DIAGNOSIS — R05 Cough: Secondary | ICD-10-CM

## 2017-08-08 DIAGNOSIS — J9811 Atelectasis: Secondary | ICD-10-CM | POA: Diagnosis not present

## 2017-08-08 DIAGNOSIS — I517 Cardiomegaly: Secondary | ICD-10-CM | POA: Insufficient documentation

## 2017-08-08 DIAGNOSIS — R918 Other nonspecific abnormal finding of lung field: Secondary | ICD-10-CM | POA: Diagnosis not present

## 2017-08-08 DIAGNOSIS — R0602 Shortness of breath: Secondary | ICD-10-CM | POA: Diagnosis not present

## 2017-08-08 MED ORDER — LEVOFLOXACIN 500 MG PO TABS: 500.0000 mg | ORAL_TABLET | Freq: Every day | ORAL | 0 refills | Status: DC

## 2017-08-08 MED ORDER — HYDROCOD POLST-CPM POLST ER 10-8 MG/5ML PO SUER: 5.0000 mL | Freq: Two times a day (BID) | ORAL | 0 refills | Status: DC | PRN

## 2017-08-08 NOTE — Progress Notes (Signed)
Patient: James GODBEE Sr. Male    DOB: April 06, 1929   83 y.o.   MRN: [id number] Visit Date: 08/08/2017  Today's Provider: Lelon Huh, MD   Chief Complaint  Patient presents with  . Cough   Subjective:    Cough  This is a new problem. Episode onset: 2 weeks ago. The problem has been gradually worsening. The cough is productive of sputum (thick yellow colored). Associated symptoms include nasal congestion, postnasal drip, a sore throat and shortness of breath. Pertinent negatives include no chest pain, chills, ear congestion, ear pain, fever, headaches, hemoptysis, rhinorrhea, sweats or wheezing. The symptoms are aggravated by lying down. Treatments tried: Mucinex and prescription cough syrup. The treatment provided moderate relief.  Patient reports that his cough and shortness of breath kept him up last night. Feels a little short of breath today. No fevers or chills.      Allergies  Allergen Reactions  . Indomethacin Nausea Only     Current Outpatient Prescriptions:  .  atorvastatin (LIPITOR) 80 MG tablet, Take 1 tablet (80 mg total) by mouth daily. (Patient taking differently: Take 80 mg by mouth daily at 6 PM. ), Disp: 90 tablet, Rfl: 4 .  azelastine (OPTIVAR) 0.05 % ophthalmic solution, Apply 1 drop to eye 2 (two) times daily., Disp: 6 mL, Rfl: 5 .  benazepril-hydrochlorthiazide (LOTENSIN HCT) 20-12.5 MG tablet, Take 0.5 tablets by mouth daily., Disp: 3 tablet, Rfl: 0 .  beta carotene w/minerals (OCUVITE) tablet, Take 1 tablet by mouth daily., Disp: , Rfl:  .  docusate sodium (COLACE) 100 MG capsule, Take 1 capsule (100 mg total) by mouth 2 (two) times daily., Disp: 60 capsule, Rfl: 0 .  fluticasone (FLONASE) 50 MCG/ACT nasal spray, Place 2 sprays into both nostrils daily. (Patient taking differently: Place 2 sprays into both nostrils every evening. ), Disp: 48 g, Rfl: 5 .  furosemide (LASIX) 20 MG tablet, Take 1 tablet (20 mg total) by mouth daily., Disp: 30 tablet, Rfl:  0 .  HYDROcodone-acetaminophen (NORCO) 10-325 MG tablet, Take 1 tablet by mouth every 6 (six) hours as needed., Disp: 30 tablet, Rfl: 0 .  lansoprazole (PREVACID) 30 MG capsule, TAKE 1 CAPSULE BY MOUTH EVERY DAY, Disp: 30 capsule, Rfl: 12 .  LORazepam (ATIVAN) 1 MG tablet, TAKE ONE TABLET BY MOUTH AT BEDTIME AS NEEDED, Disp: 30 tablet, Rfl: 4 .  Magnesium 400 MG CAPS, Take 1 capsule by mouth daily. , Disp: , Rfl:  .  metoprolol (LOPRESSOR) 50 MG tablet, Take 1 tablet (50 mg total) by mouth 2 (two) times daily., Disp: 180 tablet, Rfl: 4 .  montelukast (SINGULAIR) 10 MG tablet, TAKE ONE TABLET BY MOUTH EVERY DAY, Disp: 30 tablet, Rfl: 12 .  MULTIPLE VITAMIN PO, Take 1 tablet by mouth daily. , Disp: , Rfl:  .  SPIRIVA HANDIHALER 18 MCG inhalation capsule, INHALE 1 CAPSULE AS DIRECTED ONCE A DAY, Disp: 30 capsule, Rfl: 12 .  SYMBICORT 160-4.5 MCG/ACT inhaler, INHALE 2 PUFFS INTO THE LUNGS TWICE DAILY, Disp: 10.2 g, Rfl: 12 .  tamsulosin (FLOMAX) 0.4 MG CAPS capsule, Take 1 capsule (0.4 mg total) by mouth daily., Disp: 90 capsule, Rfl: 4 .  tiZANidine (ZANAFLEX) 4 MG tablet, TAKE ONE TABLET AT BEDTIME (Patient taking differently: TAKE ONE TABLET AT BEDTIME AS NEEDED FOR SPASMS), Disp: 30 tablet, Rfl: 12 .  traMADol (ULTRAM) 50 MG tablet, Take 50 mg by mouth daily as needed., Disp: , Rfl: 0  Review of Systems  Constitutional: Negative for appetite change, chills and fever.  HENT: Positive for postnasal drip and sore throat. Negative for ear pain and rhinorrhea.   Respiratory: Positive for cough and shortness of breath. Negative for hemoptysis, chest tightness and wheezing.   Cardiovascular: Negative for chest pain and palpitations.  Gastrointestinal: Negative for abdominal pain, nausea and vomiting.  Neurological: Negative for headaches.    Social History  Substance Use Topics  . Smoking status: Former Smoker    Packs/day: 1.00    Years: 11.00    Types: Cigarettes  . Smokeless tobacco: Never  Used     Comment: quit smoking 30 yrs ago  . Alcohol use 0.0 oz/week     Comment: beer occasionally   Objective:   BP (!) 108/56 (BP Location: Left Arm, Patient Position: Sitting, Cuff Size: Normal)   Pulse 82   Temp 98.9 F (37.2 C) (Oral)   Resp 18   Wt 188 lb (85.3 kg)   SpO2 94% Comment: room air  BMI 28.59 kg/m  Vitals:   08/08/17 0846  Weight: 188 lb (85.3 kg)     Physical Exam   General Appearance:    Alert, cooperative, no distress  Eyes:    PERRL, conjunctiva/corneas clear, EOM's intact       Lungs:     Right lower lung fields with faint rales. No wheezes, respirations unlabored  Heart:    Regular rate and rhythm  Neurologic:   Awake, alert, oriented x 3. No apparent focal neurological           defect.           Assessment & Plan:     1. Cough  - DG Chest 2 View; Future - chlorpheniramine-HYDROcodone (TUSSIONEX PENNKINETIC ER) 10-8 MG/5ML SUER; Take 5 mLs by mouth every 12 (twelve) hours as needed for cough.  Dispense: 240 mL; Refill: 0  2. Dyspnea, unspecified type  - DG Chest 2 View; Future  3. Bronchitis  - levofloxacin (LEVAQUIN) 500 MG tablet; Take 1 tablet (500 mg total) by mouth daily.  Dispense: 7 tablet; Refill: 0  Call if symptoms change or if not rapidly improving.          Lelon Huh, MD  [facility]

## 2017-08-08 NOTE — Patient Instructions (Signed)
Go to the [facility] on [address] for chest Xray  

## 2017-08-10 ENCOUNTER — Ambulatory Visit: Payer: Self-pay | Admitting: Family Medicine

## 2017-08-13 ENCOUNTER — Other Ambulatory Visit: Payer: Self-pay | Admitting: Internal Medicine

## 2017-08-13 NOTE — Telephone Encounter (Signed)
This medication was d/c after a hospital discharge. Is patient to continue prn.

## 2017-08-17 DIAGNOSIS — M40202 Unspecified kyphosis, cervical region: Secondary | ICD-10-CM | POA: Diagnosis not present

## 2017-08-17 DIAGNOSIS — M542 Cervicalgia: Secondary | ICD-10-CM | POA: Diagnosis not present

## 2017-08-17 DIAGNOSIS — M4312 Spondylolisthesis, cervical region: Secondary | ICD-10-CM | POA: Diagnosis not present

## 2017-08-17 DIAGNOSIS — Z6829 Body mass index (BMI) 29.0-29.9, adult: Secondary | ICD-10-CM | POA: Diagnosis not present

## 2017-09-11 ENCOUNTER — Encounter: Payer: Self-pay | Admitting: Family Medicine

## 2017-09-11 ENCOUNTER — Ambulatory Visit (INDEPENDENT_AMBULATORY_CARE_PROVIDER_SITE_OTHER): Payer: Medicare Other | Admitting: Family Medicine

## 2017-09-11 VITALS — BP 100/56 | HR 69 | Temp 97.8°F | Resp 18 | Wt 189.0 lb

## 2017-09-11 DIAGNOSIS — R05 Cough: Secondary | ICD-10-CM

## 2017-09-11 DIAGNOSIS — Z23 Encounter for immunization: Secondary | ICD-10-CM | POA: Diagnosis not present

## 2017-09-11 DIAGNOSIS — R059 Cough, unspecified: Secondary | ICD-10-CM

## 2017-09-11 DIAGNOSIS — R918 Other nonspecific abnormal finding of lung field: Secondary | ICD-10-CM | POA: Diagnosis not present

## 2017-09-11 MED ORDER — LEVOFLOXACIN 500 MG PO TABS: 500.0000 mg | ORAL_TABLET | Freq: Every day | ORAL | 0 refills | Status: DC

## 2017-09-11 NOTE — Progress Notes (Signed)
Patient: James MEHAFFEY Sr. Male    DOB: [birth year]   83 y.o.   MRN: [id number] Visit Date: 09/11/2017  Today's Provider: Lelon Huh, MD   Chief Complaint  Patient presents with  . Cough    x 1 week   Subjective:    Cough  This is a new problem. Episode onset: 1 week ago. The problem has been unchanged. The cough is productive of blood-tinged sputum. Associated symptoms include shortness of breath. Pertinent negatives include no chest pain, chills, fever, rhinorrhea, sore throat or wheezing. He has tried nothing for the symptoms.  He had similar symptoms  About a month ago which resolved after taking Levaquin. Chest Xr shows possible right lung base density.      Allergies  Allergen Reactions  . Indomethacin Nausea Only     Current Outpatient Prescriptions:  .  atorvastatin (LIPITOR) 80 MG tablet, Take 1 tablet (80 mg total) by mouth daily. (Patient taking differently: Take 80 mg by mouth daily at 6 PM. ), Disp: 90 tablet, Rfl: 4 .  azelastine (OPTIVAR) 0.05 % ophthalmic solution, Apply 1 drop to eye 2 (two) times daily., Disp: 6 mL, Rfl: 5 .  benazepril-hydrochlorthiazide (LOTENSIN HCT) 20-12.5 MG tablet, Take 0.5 tablets by mouth daily., Disp: 3 tablet, Rfl: 0 .  beta carotene w/minerals (OCUVITE) tablet, Take 1 tablet by mouth daily., Disp: , Rfl:  .  chlorpheniramine-HYDROcodone (TUSSIONEX PENNKINETIC ER) 10-8 MG/5ML SUER, Take 5 mLs by mouth every 12 (twelve) hours as needed for cough., Disp: 240 mL, Rfl: 0 .  docusate sodium (COLACE) 100 MG capsule, Take 1 capsule (100 mg total) by mouth 2 (two) times daily., Disp: 60 capsule, Rfl: 0 .  fluticasone (FLONASE) 50 MCG/ACT nasal spray, Place 2 sprays into both nostrils daily. (Patient taking differently: Place 2 sprays into both nostrils every evening. ), Disp: 48 g, Rfl: 5 .  furosemide (LASIX) 20 MG tablet, Take 1 tablet (20 mg total) by mouth daily., Disp: 30 tablet, Rfl: 0 .  HYDROcodone-acetaminophen (NORCO) 10-325  MG tablet, Take 1 tablet by mouth every 6 (six) hours as needed., Disp: 30 tablet, Rfl: 0 .  lansoprazole (PREVACID) 30 MG capsule, TAKE 1 CAPSULE BY MOUTH EVERY DAY, Disp: 30 capsule, Rfl: 12 .  levofloxacin (LEVAQUIN) 500 MG tablet, Take 1 tablet (500 mg total) by mouth daily., Disp: 7 tablet, Rfl: 0 .  LORazepam (ATIVAN) 1 MG tablet, TAKE ONE TABLET BY MOUTH AT BEDTIME AS NEEDED, Disp: 30 tablet, Rfl: 4 .  Magnesium 400 MG CAPS, Take 1 capsule by mouth daily. , Disp: , Rfl:  .  metoprolol (LOPRESSOR) 50 MG tablet, Take 1 tablet (50 mg total) by mouth 2 (two) times daily., Disp: 180 tablet, Rfl: 4 .  montelukast (SINGULAIR) 10 MG tablet, TAKE ONE TABLET BY MOUTH EVERY DAY, Disp: 30 tablet, Rfl: 12 .  MULTIPLE VITAMIN PO, Take 1 tablet by mouth daily. , Disp: , Rfl:  .  PROAIR HFA 108 (90 Base) MCG/ACT inhaler, INHALE 2 PUFFS INTO LUNGS EVERY 6 HOURS AS NEEDED FOR WHEEZING OR SHORTNESS OF BREATH, Disp: 8.5 g, Rfl: 5 .  SPIRIVA HANDIHALER 18 MCG inhalation capsule, INHALE 1 CAPSULE AS DIRECTED ONCE A DAY, Disp: 30 capsule, Rfl: 12 .  SYMBICORT 160-4.5 MCG/ACT inhaler, INHALE 2 PUFFS INTO THE LUNGS TWICE DAILY, Disp: 10.2 g, Rfl: 12 .  tamsulosin (FLOMAX) 0.4 MG CAPS capsule, Take 1 capsule (0.4 mg total) by mouth daily., Disp: 90 capsule, Rfl:  4 .  tiZANidine (ZANAFLEX) 4 MG tablet, TAKE ONE TABLET AT BEDTIME (Patient taking differently: TAKE ONE TABLET AT BEDTIME AS NEEDED FOR SPASMS), Disp: 30 tablet, Rfl: 12 .  traMADol (ULTRAM) 50 MG tablet, Take 50 mg by mouth daily as needed., Disp: , Rfl: 0  Review of Systems  Constitutional: Negative for appetite change, chills and fever.  HENT: Positive for congestion. Negative for rhinorrhea and sore throat.   Respiratory: Positive for cough (productive) and shortness of breath. Negative for chest tightness and wheezing.   Cardiovascular: Negative for chest pain and palpitations.  Gastrointestinal: Negative for abdominal pain, nausea and vomiting.     Social History  Substance Use Topics  . Smoking status: Former Smoker    Packs/day: 1.00    Years: 11.00    Types: Cigarettes  . Smokeless tobacco: Never Used     Comment: quit smoking 30 yrs ago  . Alcohol use 0.0 oz/week     Comment: beer occasionally   Objective:   BP (!) 100/56 (BP Location: Right Arm, Patient Position: Sitting, Cuff Size: Large)   Pulse 69   Temp 97.8 F (36.6 C) (Oral)   Resp 18   Wt 189 lb (85.7 kg)   SpO2 95% Comment: room air  BMI 28.74 kg/m  Vitals:   09/11/17 1443 09/11/17 1446  BP: (!) 100/58 (!) 100/56  Pulse: 69   Resp: 18   Temp: 97.8 F (36.6 C)   TempSrc: Oral   SpO2: 95%   Weight: 189 lb (85.7 kg)      Physical Exam   General Appearance:    Alert, cooperative, no distress  Eyes:    PERRL, conjunctiva/corneas clear, EOM's intact       Lungs:    occasional expiratory wheeze, respirations unlabored  Heart:    Regular rate and rhythm  Neurologic:   Awake, alert, oriented x 3. No apparent focal neurological           defect.           Assessment & Plan:     1. Cough  - levofloxacin (LEVAQUIN) 500 MG tablet; Take 1 tablet (500 mg total) by mouth daily.  Dispense: 7 tablet; Refill: 0 - CT Chest W Contrast; Future  2. Lung mass  - CT Chest W Contrast; Future  3. Need for influenza vaccination  - Flu vaccine HIGH DOSE PF (Fluzone High dose)       Lelon Huh, MD  [facility]

## 2017-09-17 ENCOUNTER — Telehealth: Payer: Self-pay | Admitting: *Deleted

## 2017-09-17 ENCOUNTER — Ambulatory Visit
Admission: RE | Admit: 2017-09-17 | Discharge: 2017-09-17 | Disposition: A | Discharge status: Home / Self Care | Payer: Medicare Other | Source: Ambulatory Visit | Attending: Family Medicine | Admitting: Family Medicine

## 2017-09-17 DIAGNOSIS — R918 Other nonspecific abnormal finding of lung field: Secondary | ICD-10-CM | POA: Insufficient documentation

## 2017-09-17 DIAGNOSIS — R05 Cough: Secondary | ICD-10-CM | POA: Diagnosis not present

## 2017-09-17 DIAGNOSIS — I700 Atherosclerosis of aorta: Secondary | ICD-10-CM | POA: Insufficient documentation

## 2017-09-17 DIAGNOSIS — R059 Cough, unspecified: Secondary | ICD-10-CM

## 2017-09-17 DIAGNOSIS — R911 Solitary pulmonary nodule: Secondary | ICD-10-CM

## 2017-09-17 LAB — POCT I-STAT CREATININE: Creatinine, Ser: 1.1 mg/dL (ref 0.61–1.24)

## 2017-09-17 MED ORDER — IOPAMIDOL (ISOVUE-300) INJECTION 61%
75.0000 mL | Freq: Once | INTRAVENOUS | Status: AC | PRN
  Administered 2017-09-17: 75 mL via INTRAVENOUS

## 2017-09-17 MED ORDER — LEVOFLOXACIN 500 MG PO TABS: 500.0000 mg | ORAL_TABLET | Freq: Every day | ORAL | 0 refills | Status: DC

## 2017-09-17 NOTE — Telephone Encounter (Signed)
Patient was notified of results. Rx sent to pharmacy. Referral in epic.

## 2017-09-17 NOTE — Telephone Encounter (Signed)
-----   Message from Birdie Sons, MD sent at 09/17/2017  4:04 PM EDT ----- Ct shows area of pneumonia in right upper lobe. Should stay on levaquin for another week. Please send refill for Levaquin 500mg  a day for 7 days.  There is also a lesion in right lower lobe. Radiologist thinks it could be scar tissue, but cannot rule out tumor. Needs referral to pulmonary for follow up.

## 2017-09-17 NOTE — Telephone Encounter (Signed)
Please schedule referral to pulmonology. Thanks!

## 2017-09-19 ENCOUNTER — Ambulatory Visit (INDEPENDENT_AMBULATORY_CARE_PROVIDER_SITE_OTHER): Payer: Medicare Other | Admitting: Internal Medicine

## 2017-09-19 ENCOUNTER — Encounter: Payer: Self-pay | Admitting: Internal Medicine

## 2017-09-19 DIAGNOSIS — R918 Other nonspecific abnormal finding of lung field: Secondary | ICD-10-CM | POA: Diagnosis not present

## 2017-09-19 DIAGNOSIS — R911 Solitary pulmonary nodule: Secondary | ICD-10-CM

## 2017-09-19 NOTE — Progress Notes (Signed)
[name]      Assessment and Plan:  The patient is a 83 year old male with chronic progressive dyspnea, which is thought to be multifactorial due to COPD, systolic congestive heart failure, elevated hemidiaphragm. Now following up because of a lung nodule.  Lung nodule.  --RLL nodule, will send for PET scan, will likely need a biopsy, we'll plan based on results of the PET scan.  COPD.  --Continue spiriva, symbicort.  --s/p Prevnar-13 vaccine.  Deconditioning/Debility.  --Continue activity, can consider referral to pulm rehab in future.   Elevated right diaphragm.  --Present for many years, likely contributing to dyspnea.   Chronic systolic cardiac dysfunction.  --Stable EF of 40%.   Dyspnea. --Dyspnea is likely multifactorial due to all of the above issues.  --Continue current inhalers.   Orders Placed This Encounter  Procedures  . NM PET Image Initial (PI) Skull Base To Thigh    Standing Status:   Future    Standing Expiration Date:   11/19/2018                                   Date: 09/19/2017  MRN# [id number] James [name] 1929/06/12   James [name] is a 83 y.o. old male seen in consultation for chief complaint of:    Chief Complaint  Patient presents with  . Advice Only    SOB w/exertion: Prod cough; on Levaquin at this time    HPI:   The patient is a 83 year old male with chronic progressive dyspnea, which is thought to be multifactorial due to COPD, systolic congestive heart failure, elevated hemidiaphragm. He notes that he has had trouble breathing progressively for the past 15 years. Last visit he was asked to continue Spiriva, Symbicort, increased physical activity level, and using albuterol MDI with exercise.  He was having some cough with blood, there. He was sent for  CT chest on 03/13/17; this showed chronic interstitial changes, a 3 cm mass-like opacity on posterior right costophrenic sulcus. He was recently sent  for a repeat CT on 09/17/17. On my personal review, there is RLL atelectasis with elevated right diaphragm. There is mass in the RLL posteriorly, this appears to be slightly larger than previous scan in March 2018. The patient remains fairly active, continues to mow his lawn on a riding mower, he can continue to do usual activities of daily living. He recently underwent cervical spine surgery in March of this year without complications.  He has cats and dogs at home, seldom in the bedroom.  Review of Dr. Bethanne Ginger records: He is noted to have controlled Afib, most recent echocardiogram showed stable systolic function of about 40%.   **CXR  05/22/16; elevated right diaphragm, stable for several years.   **Spirometryv10/12/17; restriction with FEV1=48%  Medication:    Reviewed.     Allergies:  Indomethacin  Review of Systems: Gen:  Denies  fever, sweats, chills HEENT: Denies blurred vision, double vision. bleeds, sore throat Cvc:  No dizziness, chest pain. Resp:   Denies cough or sputum production, Gi: Denies swallowing difficulty, stomach pain. Gu:  Denies bladder incontinence, burning urine Ext:   No Joint pain, stiffness. Skin: No skin rash,  hives  Endoc:  No polyuria, polydipsia. Psych: No depression, insomnia. Other:  All other systems were reviewed with the patient and were negative other that what is mentioned in the HPI.   Physical Examination:   VS:  BP 116/72 (BP Location: Left Arm, Cuff Size: Normal)   Pulse 78   Ht 5\' 8"  (1.727 m)   Wt 186 lb (84.4 kg)   SpO2 93%   BMI 28.28 kg/m   General Appearance: No distress  Neuro:without focal findings,  speech normal,  HEENT: PERRLA, EOM intact.   Pulmonary: normal breath sounds, No wheezing. Decreased air entry bilaterally.   CardiovascularNormal S1,S2.  No m/r/g.   Abdomen: Benign, Soft, non-tender. Renal:  No costovertebral tenderness  GU:  No performed at this time. Endoc: No evident thyromegaly, no signs of  acromegaly. Skin:   warm, no rashes, no ecchymosis  Extremities: normal, no cyanosis, clubbing.  Other findings:    LABORATORY PANEL:   CBC No results for input(s): WBC, HGB, HCT, PLT in the last 168 hours. ------------------------------------------------------------------------------------------------------------------  Chemistries   Recent Labs Lab 09/17/17 1259  CREATININE 1.10   ------------------------------------------------------------------------------------------------------------------  Cardiac Enzymes No results for input(s): TROPONINI in the last 168 hours. ------------------------------------------------------------  RADIOLOGY:  Ct Chest W Contrast  Result Date: 09/17/2017 CLINICAL DATA:  Cough for several weeks. History of bypass surgery and prostate cancer EXAM: CT CHEST WITH CONTRAST TECHNIQUE: Multidetector CT imaging of the chest was performed during intravenous contrast administration. CONTRAST:  16mL ISOVUE-300 IOPAMIDOL (ISOVUE-300) INJECTION 61% COMPARISON:  03/13/2017 FINDINGS: Cardiovascular: Heart size upper normal. Coronary artery calcification is evident. Patient is status post CABG Atherosclerotic calcification is noted in the wall of the thoracic aorta. Mediastinum/Nodes: No mediastinal lymphadenopathy. There is no hilar lymphadenopathy. The esophagus has normal imaging features. There is no axillary lymphadenopathy. Lungs/Pleura: Interval development of central ill-defined peribronchovascular nodularity in the right upper lobe with persistent right middle lobe atelectasis. The posterior right costophrenic sulcus lesion is again noted measuring 4.5 x 1.8 cm. No pulmonary edema or pleural effusion. Upper Abdomen: Unremarkable. Musculoskeletal: Bone windows reveal no worrisome lytic or sclerotic osseous lesions. IMPRESSION: 1. Stable to slight increase and posterior right lower lobe lesion. Interval persistence is not suggestive of infection. Scar would now be  a possibility but given the question of slight enlargement, neoplasm remains a concern. PET-CT recommended to further evaluate. 2. Interval development of central ill-defined peribronchovascular nodularity in the right upper lobe compatible with bronchopneumonia or atypical infection. 3.  Aortic Atherosclerois (ICD10-170.0) Electronically Signed   By: Misty Stanley M.D.   On: 09/17/2017 15:46       Thank  you for the consultation and for allowing [facility] to assist in the care of your patient. Our recommendations are noted above.  Please contact us if we can be of further service.   Marda Stalker, MD.  Board Certified in Internal Medicine, Pulmonary Medicine, [place], and Sleep Medicine.  [facility] Office Number: [phone]  Patricia Pesa, M.D.  Vilinda Boehringer, M.D.  Merton Border, M.D  09/19/2017

## 2017-09-19 NOTE — Patient Instructions (Signed)
--  Will send for PET scan, will contact you once we have the results.

## 2017-09-25 ENCOUNTER — Ambulatory Visit
Admission: RE | Admit: 2017-09-25 | Discharge: 2017-09-25 | Disposition: A | Discharge status: Home / Self Care | Payer: Medicare Other | Source: Ambulatory Visit | Attending: Internal Medicine | Admitting: Internal Medicine

## 2017-09-25 DIAGNOSIS — R918 Other nonspecific abnormal finding of lung field: Secondary | ICD-10-CM | POA: Insufficient documentation

## 2017-09-25 DIAGNOSIS — I700 Atherosclerosis of aorta: Secondary | ICD-10-CM | POA: Insufficient documentation

## 2017-09-25 DIAGNOSIS — Z79899 Other long term (current) drug therapy: Secondary | ICD-10-CM | POA: Diagnosis not present

## 2017-09-25 DIAGNOSIS — R911 Solitary pulmonary nodule: Secondary | ICD-10-CM

## 2017-09-25 LAB — GLUCOSE, CAPILLARY: Glucose-Capillary: 104 mg/dL — ABNORMAL HIGH (ref 65–99)

## 2017-09-25 MED ORDER — FLUDEOXYGLUCOSE F - 18 (FDG) INJECTION
12.6200 | Freq: Once | INTRAVENOUS | Status: AC | PRN
  Administered 2017-09-25: 12.62 via INTRAVENOUS

## 2017-09-28 ENCOUNTER — Other Ambulatory Visit: Payer: Self-pay | Admitting: *Deleted

## 2017-10-04 ENCOUNTER — Telehealth: Payer: Self-pay | Admitting: *Deleted

## 2017-10-04 DIAGNOSIS — R911 Solitary pulmonary nodule: Secondary | ICD-10-CM

## 2017-10-04 NOTE — Telephone Encounter (Signed)
Pt discussed at tumor case conference today. Overall consensus was to watch RLL lesion with repeat scan. If pt wishes to have biopsy however, it could be performed with CT guidance. If area is confirmed lung carcinoma then Dr. Baruch Gouty stated he could offer radiation treatments if patient desires to proceed with treatment.   Let me know if you have questions.

## 2017-10-05 NOTE — Telephone Encounter (Signed)
Thank you! I called and left voicemail that we will repeat CT chest in 3 months. Misty, please arrange for repeat CT chest without contrast in 3 months, follow up with me after. Thanks.

## 2017-10-05 NOTE — Telephone Encounter (Signed)
CT chest w/o ordered for 3 months with f/u with DR.

## 2017-10-05 NOTE — Addendum Note (Signed)
Addended by: Oscar La R on: 10/05/2017 03:43 PM   Modules accepted: Orders

## 2017-10-06 ENCOUNTER — Other Ambulatory Visit: Payer: Self-pay | Admitting: Family Medicine

## 2017-10-08 NOTE — Telephone Encounter (Signed)
See refill request.

## 2017-10-10 DIAGNOSIS — I2510 Atherosclerotic heart disease of native coronary artery without angina pectoris: Secondary | ICD-10-CM | POA: Diagnosis not present

## 2017-10-10 DIAGNOSIS — I5022 Chronic systolic (congestive) heart failure: Secondary | ICD-10-CM | POA: Diagnosis not present

## 2017-10-10 DIAGNOSIS — I4891 Unspecified atrial fibrillation: Secondary | ICD-10-CM | POA: Diagnosis not present

## 2017-10-10 DIAGNOSIS — E7801 Familial hypercholesterolemia: Secondary | ICD-10-CM | POA: Diagnosis not present

## 2017-10-15 ENCOUNTER — Other Ambulatory Visit: Payer: Self-pay | Admitting: Family Medicine

## 2017-10-16 ENCOUNTER — Telehealth: Payer: Self-pay

## 2017-10-16 ENCOUNTER — Emergency Department
Admission: EM | Admit: 2017-10-16 | Discharge: 2017-10-16 | Disposition: A | Discharge status: Home / Self Care | Payer: Medicare Other | Attending: Emergency Medicine | Admitting: Emergency Medicine

## 2017-10-16 ENCOUNTER — Emergency Department: Payer: Medicare Other

## 2017-10-16 ENCOUNTER — Encounter: Payer: Self-pay | Admitting: *Deleted

## 2017-10-16 DIAGNOSIS — I2510 Atherosclerotic heart disease of native coronary artery without angina pectoris: Secondary | ICD-10-CM | POA: Diagnosis not present

## 2017-10-16 DIAGNOSIS — J189 Pneumonia, unspecified organism: Secondary | ICD-10-CM | POA: Diagnosis not present

## 2017-10-16 DIAGNOSIS — Z87891 Personal history of nicotine dependence: Secondary | ICD-10-CM | POA: Insufficient documentation

## 2017-10-16 DIAGNOSIS — Z79899 Other long term (current) drug therapy: Secondary | ICD-10-CM | POA: Diagnosis not present

## 2017-10-16 DIAGNOSIS — Z96642 Presence of left artificial hip joint: Secondary | ICD-10-CM | POA: Diagnosis not present

## 2017-10-16 DIAGNOSIS — I10 Essential (primary) hypertension: Secondary | ICD-10-CM | POA: Diagnosis not present

## 2017-10-16 DIAGNOSIS — J449 Chronic obstructive pulmonary disease, unspecified: Secondary | ICD-10-CM | POA: Diagnosis not present

## 2017-10-16 DIAGNOSIS — R079 Chest pain, unspecified: Secondary | ICD-10-CM | POA: Diagnosis not present

## 2017-10-16 DIAGNOSIS — R0602 Shortness of breath: Secondary | ICD-10-CM | POA: Diagnosis not present

## 2017-10-16 LAB — BASIC METABOLIC PANEL
Anion gap: 10 (ref 5–15)
BUN: 18 mg/dL (ref 6–20)
CO2: 26 mmol/L (ref 22–32)
Calcium: 8.5 mg/dL — ABNORMAL LOW (ref 8.9–10.3)
Chloride: 94 mmol/L — ABNORMAL LOW (ref 101–111)
Creatinine, Ser: 0.98 mg/dL (ref 0.61–1.24)
GFR calc Af Amer: >60 mL/min (ref >60)
GFR calc non Af Amer: >60 mL/min (ref >60)
Glucose, Bld: 156 mg/dL — ABNORMAL HIGH (ref 65–99)
Potassium: 4.4 mmol/L (ref 3.5–5.1)
Sodium: 130 mmol/L — ABNORMAL LOW (ref 135–145)

## 2017-10-16 LAB — CBC
HCT: 43.5 % (ref 40.0–52.0)
Hemoglobin: 14 g/dL (ref 13.0–18.0)
MCH: 27.5 pg (ref 26.0–34.0)
MCHC: 32.3 g/dL (ref 32.0–36.0)
MCV: 85.2 fL (ref 80.0–100.0)
Platelets: 174 10*3/uL (ref 150–440)
RBC: 5.1 MIL/uL (ref 4.40–5.90)
RDW: 15.1 % — ABNORMAL HIGH (ref 11.5–14.5)
WBC: 12.2 10*3/uL — ABNORMAL HIGH (ref 3.8–10.6)

## 2017-10-16 LAB — APTT: aPTT: 29 seconds (ref 24–36)

## 2017-10-16 LAB — TROPONIN I
Troponin I: <0.03 ng/mL (ref <0.03)
Troponin I: <0.03 ng/mL (ref <0.03)

## 2017-10-16 LAB — MAGNESIUM: Magnesium: 1.6 mg/dL — ABNORMAL LOW (ref 1.7–2.4)

## 2017-10-16 LAB — PROTIME-INR
INR: 0.96
Prothrombin Time: 12.7 seconds (ref 11.4–15.2)

## 2017-10-16 MED ORDER — DOXYCYCLINE HYCLATE 100 MG PO CAPS: 100.0000 mg | ORAL_CAPSULE | Freq: Two times a day (BID) | ORAL | 0 refills | Status: AC

## 2017-10-16 MED ORDER — CEFTRIAXONE SODIUM IN DEXTROSE 20 MG/ML IV SOLN
1.0000 g | Freq: Once | INTRAVENOUS | Status: AC
  Administered 2017-10-16: 1 g via INTRAVENOUS
  Filled 2017-10-16: qty 50

## 2017-10-16 MED ORDER — DOXYCYCLINE HYCLATE 100 MG PO TABS
100.0000 mg | ORAL_TABLET | Freq: Once | ORAL | Status: AC
  Administered 2017-10-16: 100 mg via ORAL
  Filled 2017-10-16: qty 1

## 2017-10-16 MED ORDER — AMOXICILLIN-POT CLAVULANATE 875-125 MG PO TABS: 1.0000 | ORAL_TABLET | Freq: Two times a day (BID) | ORAL | 0 refills | Status: AC

## 2017-10-16 MED ORDER — MAGNESIUM SULFATE 2 GM/50ML IV SOLN
2.0000 g | Freq: Once | INTRAVENOUS | Status: AC
  Administered 2017-10-16: 2 g via INTRAVENOUS
  Filled 2017-10-16: qty 50

## 2017-10-16 NOTE — ED Notes (Signed)
Patient transported to X-ray 

## 2017-10-16 NOTE — Discharge Instructions (Signed)

## 2017-10-16 NOTE — ED Provider Notes (Signed)
[facility] Emergency Department Provider Note  ____________________________________________  Time seen: Approximately 11:18 AM  I have reviewed the triage vital signs and the nursing notes.   HISTORY  Chief Complaint Shortness of Breath and Hemoptysis   HPI James [name] is a 83 y.o. male with a history of COPD, CAD, hypertension, paroxysmal atrial fibrillation on aspirin, hypertension and TIA who presents for evaluation of shortness of breath and malaise. Patient reports that he has chronic shortness of breath for his COPD however since yesterday he has been feeling progressively worse. He has a cough productive of yellow sputum with some blood streaks. Has used his inhaler this morning with improvement of his shortness of breath. No fever but has had malaise and fatigue, no nausea, no vomiting no diarrhea, no chest pain, no worsening baseline bilateral lower extremity swelling, no leg pain, no personal or family history of blood clots, no recent travel or immobilization. Patient's wife is concerned that he might have pneumonia because every time he has hemoptysis he ends up having pneumonia. His last antibiotic course was in August for pneumonia.  Past Medical History:  Diagnosis Date  . Arthritis   . Back pain   . CAD (coronary artery disease)   . Cataract    right  . COPD (chronic obstructive pulmonary disease) ([place])    SPiriva and SYmbicort daily. Albuterol as needed  . Diverticulosis   . Dyspnea    with exertion  . Enlarged prostate    takes Flomax daily  . GERD (gastroesophageal reflux disease)   . History of chicken pox   . History of gout   . History of measles   . History of mumps   . HOH (hard of hearing)   . Hyperglycemia   . Hyperlipidemia    takes Atorvastatin daily  . Hypertension    takes Metoprolol daily as well as Lotensin HCT  . Hypotension   . Joint pain   . Microscopic colitis   . PAF (paroxysmal atrial fibrillation) ([place]),  RVR 03/14/2017  . Prostate cancer ([place])   . TIA (transient ischemic attack)   . Weakness    numbness and tingling.mainly on right    Patient Active Problem List   Diagnosis Date Noted  . Lung mass 09/11/2017  . Hematoma of neck 03/14/2017  . PAF (paroxysmal atrial fibrillation) ([place]), RVR 03/14/2017  . Chronic systolic heart failure ([place]) 03/14/2017  . Dysphagia 03/13/2017  . Cervical spondylosis with myelopathy 03/07/2017  . Restrictive lung disease 09/26/2016  . Cervical neck pain with evidence of disc disease 07/04/2016  . PVC (premature ventricular contraction) 11/16/2015  . Abdominal pain 07/27/2015  . Adenocarcinoma of prostate ([place]) 07/27/2015  . Arthritis 07/27/2015  . Blood in feces 07/27/2015  . BPH (benign prostatic hyperplasia) 07/27/2015  . COPD (chronic obstructive pulmonary disease) ([place]) 07/27/2015  . GERD (gastroesophageal reflux disease) 07/27/2015  . Gout 07/27/2015  . Hyperglycemia 07/27/2015  . Hypotension 07/27/2015  . Insomnia 07/27/2015  . Shortness of breath 07/27/2015  . TIA (transient ischemic attack) 07/27/2015  . Atrial fibrillation ([place]) 11/16/2014  . BP (high blood pressure) 10/13/2014  . Urinary hesitancy 09/02/2012  . ED (erectile dysfunction) of organic origin 06/10/2010  . CAD (coronary artery disease) 03/14/2010  . LBP (low back pain) 03/01/2009  . Allergic rhinitis 09/28/2008  . Essential (primary) hypertension 08/02/2007  . Hyperlipidemia 08/02/2007  . Acquired spondylolisthesis 03/20/2005  . Diverticulosis of colon without hemorrhage 12/18/2000  . Arthropathy 12/18/1998  Past Surgical History:  Procedure Laterality Date  . ANTERIOR CERVICAL DECOMP/DISCECTOMY FUSION N/A 03/07/2017   Procedure: ANTERIOR CERVICAL DECOMPRESSION/DISCECTOMY FUSION CERVICAL THREE- CERVICAL FOUR, CERVICAL FOUR- CERVICAL FIVE;  Surgeon: Newman Pies, MD;  Location: [facility];  Service: Neurosurgery;  Laterality: N/A;  ANTERIOR CERVICAL  DECOMPRESSION/DISCECTOMY FUSION CERVICAL 3- CERVICAL 4, CERVIACL 4- CERVICAL 5  . APPENDECTOMY  1950  . CARDIAC CATHETERIZATION  05/29/2013   EF=40-45%. Moderate pulmonary hypertension. Infero/ lateral hypokinesis  . cataract surgery Left   . COLONOSCOPY    . CORONARY ARTERY BYPASS GRAFT  2011   x 5  . MRI BRAIN  06/07/2013   Mild chronic involutional changes. No acute abnormalities  . MRI of neck    . Myocardial Perfusion Scan  05/29/2013   Abnormal myocardial perfusion image consistent with myocardial infarction  . PROSTATE SURGERY  11/18/2012   radiation seed  . TOTAL HIP ARTHROPLASTY Left 1997   hip joint replacement    Prior to Admission medications   Medication Sig Start Date End Date Taking? Authorizing Provider  atorvastatin (LIPITOR) 80 MG tablet TAKE 1 TABLET BY MOUTH DAILY 10/08/17  Yes Birdie Sons, MD  benazepril-hydrochlorthiazide (LOTENSIN HCT) 20-12.5 MG tablet Take 0.5 tablets by mouth daily. 07/10/17  Yes Birdie Sons, MD  beta carotene w/minerals (OCUVITE) tablet Take 1 tablet by mouth daily.   Yes [provider]  fluticasone (FLONASE) 50 MCG/ACT nasal spray Place 2 sprays into both nostrils daily. Patient taking differently: Place 2 sprays into both nostrils every evening.  11/13/16  Yes Birdie Sons, MD  furosemide (LASIX) 20 MG tablet Take 1 tablet (20 mg total) by mouth daily. 03/15/17  Yes Newman Pies, MD  lansoprazole (PREVACID) 30 MG capsule TAKE 1 CAPSULE BY MOUTH EVERY DAY 08/06/17  Yes Birdie Sons, MD  LORazepam (ATIVAN) 1 MG tablet TAKE 1 TABLET AT BEDTIME AS NEEDED FOR 10/15/17  Yes Birdie Sons, MD  Magnesium 400 MG CAPS Take 1 capsule by mouth daily.    Yes [provider]  metoprolol (LOPRESSOR) 50 MG tablet Take 1 tablet (50 mg total) by mouth 2 (two) times daily. 08/25/16  Yes Birdie Sons, MD  montelukast (SINGULAIR) 10 MG tablet TAKE ONE TABLET BY MOUTH EVERY DAY 07/18/17  Yes Birdie Sons, MD    MULTIPLE VITAMIN PO Take 1 tablet by mouth daily.    Yes [provider]  SPIRIVA HANDIHALER 18 MCG inhalation capsule INHALE 1 CAPSULE AS DIRECTED ONCE A DAY 01/16/17  Yes Birdie Sons, MD  SYMBICORT 160-4.5 MCG/ACT inhaler INHALE 2 PUFFS INTO THE LUNGS TWICE DAILY 07/23/17  Yes Birdie Sons, MD  tamsulosin (FLOMAX) 0.4 MG CAPS capsule Take 1 capsule (0.4 mg total) by mouth daily. 11/06/16  Yes Birdie Sons, MD  traMADol (ULTRAM) 50 MG tablet Take 50 mg by mouth daily as needed. 04/20/17  Yes [provider]  amoxicillin-clavulanate (AUGMENTIN) 875-125 MG tablet Take 1 tablet by mouth 2 (two) times daily. 10/16/17 10/26/17  Rudene Re, MD  azelastine (OPTIVAR) 0.05 % ophthalmic solution Apply 1 drop to eye 2 (two) times daily. 08/23/16   Birdie Sons, MD  chlorpheniramine-HYDROcodone (TUSSIONEX PENNKINETIC ER) 10-8 MG/5ML SUER Take 5 mLs by mouth every 12 (twelve) hours as needed for cough. 08/08/17   Birdie Sons, MD  docusate sodium (COLACE) 100 MG capsule Take 1 capsule (100 mg total) by mouth 2 (two) times daily. 03/08/17   Newman Pies, MD  doxycycline (VIBRAMYCIN)  100 MG capsule Take 1 capsule (100 mg total) by mouth 2 (two) times daily. 10/16/17 10/26/17  Rudene Re, MD  HYDROcodone-acetaminophen [facility]) 10-325 MG tablet Take 1 tablet by mouth every 6 (six) hours as needed. 07/10/17   Birdie Sons, MD  levofloxacin (LEVAQUIN) 500 MG tablet Take 1 tablet (500 mg total) by mouth daily. Patient not taking: Reported on 10/16/2017 09/17/17   Birdie Sons, MD  PROAIR HFA 108 [phone] Base) MCG/ACT inhaler INHALE 2 PUFFS INTO LUNGS EVERY 6 HOURS AS NEEDED FOR WHEEZING OR SHORTNESS OF BREATH 08/13/17   Laverle Hobby, MD  tiZANidine (ZANAFLEX) 4 MG tablet TAKE ONE TABLET AT BEDTIME Patient taking differently: TAKE ONE TABLET AT BEDTIME AS NEEDED FOR SPASMS 01/07/17   Birdie Sons, MD    Allergies Indomethacin  Family History  Problem  Relation Age of Onset  . Heart attack Mother   . Stroke Father   . Heart attack Father   . Hypertension Father   . Heart attack Sister   . Bladder Cancer Brother   . Kidney cancer Brother   . Heart Problems Brother     Social History Social History  Substance Use Topics  . Smoking status: Former Smoker    Packs/day: 1.00    Years: 11.00    Types: Cigarettes  . Smokeless tobacco: Never Used     Comment: quit smoking 30 yrs ago  . Alcohol use 0.0 oz/week     Comment: beer occasionally    Review of Systems  Constitutional: Negative for fever. + malaise Eyes: Negative for visual changes. ENT: Negative for sore throat. Neck: No neck pain  Cardiovascular: Negative for chest pain. Respiratory: + shortness of breath, hemoptysis Gastrointestinal: Negative for abdominal pain, vomiting or diarrhea. Genitourinary: Negative for dysuria. Musculoskeletal: Negative for back pain. Skin: Negative for rash. Neurological: Negative for headaches, weakness or numbness. Psych: No SI or HI  ____________________________________________   PHYSICAL EXAM:  VITAL SIGNS: ED Triage Vitals  Enc Vitals Group     BP 10/16/17 0941 110/81     Pulse Rate 10/16/17 0941 100     Resp 10/16/17 0941 16     Temp 10/16/17 0941 (!) 97.5 F (36.4 C)     Temp Source 10/16/17 0941 Oral     SpO2 10/16/17 0941 96 %     Weight 10/16/17 0940 189 lb (85.7 kg)     Height 10/16/17 0940 5\' 8"  (1.727 m)     Head Circumference --      Peak Flow --      Pain Score 10/16/17 0939 0     Pain Loc --      Pain Edu? --      Excl. in [place]? --     Constitutional: Alert and oriented. Well appearing and in no apparent distress. HEENT:      Head: Normocephalic and atraumatic.         Eyes: Conjunctivae are normal. Sclera is non-icteric.       Mouth/Throat: Mucous membranes are moist.       Neck: Supple with no signs of meningismus. Cardiovascular: Regular rate and rhythm. No murmurs, gallops, or rubs. 2+ symmetrical  distal pulses are present in all extremities. No JVD. Respiratory: Normal respiratory effort. Lungs are clear to auscultation bilaterally with crackles on the R base. No wheezes and good air movement.  Gastrointestinal: Soft, non tender, and non distended with positive bowel sounds. No rebound or guarding. Genitourinary: No CVA tenderness. Musculoskeletal: Nontender with  normal range of motion in all extremities. No edema, cyanosis, or erythema of extremities. Neurologic: Normal speech and language. Face is symmetric. Moving all extremities. No gross focal neurologic deficits are appreciated. Skin: Skin is warm, dry and intact. No rash noted. Psychiatric: Mood and affect are normal. Speech and behavior are normal.  ____________________________________________   LABS (all labs ordered are listed, but only abnormal results are displayed)  Labs Reviewed  BASIC METABOLIC PANEL - Abnormal; Notable for the following:       Result Value   Sodium 130 (*)    Chloride 94 (*)    Glucose, Bld 156 (*)    Calcium 8.5 (*)    All other components within normal limits  CBC - Abnormal; Notable for the following:    WBC 12.2 (*)    RDW 15.1 (*)    All other components within normal limits  MAGNESIUM - Abnormal; Notable for the following:    Magnesium 1.6 (*)    All other components within normal limits  TROPONIN I  PROTIME-INR  APTT  TROPONIN I   ____________________________________________  EKG  ED ECG REPORT I, Rudene Re, the attending physician, personally viewed and interpreted this ECG.  Sinus rhythm with frequent PVCs, rate of 84, prolonged QRS, normal QTC, left axis deviation, no ST elevations or depressions. Unchanged from prior from March 2018 ____________________________________________  RADIOLOGY  CXR: Alveolar opacity in the right mid lung worrisome for pneumonia. Subtle abnormality was observed in this region on recent PET-CT study. Followup PA and lateral chest  X-ray is recommended in 3-4 weeks following trial of antibiotic therapy to ensure resolution and exclude underlying malignancy.  Mild interstitial prominence diffusely as compared to the previous study may reflect mild interstitial edema or less likely interstitial pneumonia.  Thoracic aortic atherosclerosis.  ____________________________________________   PROCEDURES  Procedure(s) performed: None Procedures Critical Care performed:  None ____________________________________________   INITIAL IMPRESSION / ASSESSMENT AND PLAN / ED COURSE  83 y.o. male with a history of COPD, CAD, hypertension, paroxysmal atrial fibrillation on aspirin, hypertension and TIA who presents for evaluation of shortness of breath, productive cough, hemoptysis, and malaise. Chest x-ray concerning for pneumonia. Patient has normal work of breathing, normal sats, no wheezing on exam, has crackles on the R consistent with CXR finding of R sided PNA. he is afebrile with no tachycardia or hypotension. Labs with mild leukocytosis. Will give rocephin and doxycycline. Will check ambulatory sats. SOB resolved with duoneb PTA.EKG with frequent PVCs which is baseline per patient. Mag slightly decreased so will give IV mag due to frequent PVCs. Troponin negative.     _________________________ 1:37 PM on 10/16/2017 -----------------------------------------  Patient remains extremely well appearing, with normal work of breathing, normal sats. Troponin 2 is negative. No evidence of sepsis. Patient was given Rocephin and doxycycline. He will be discharged home on Augmentin and doxycycline and close follow-up with his primary care doctor. Discussed return precautions with patient and his wife.  As part of my medical decision making, I reviewed the following data within the [place] History obtained from family, Nursing notes reviewed and incorporated, Labs reviewed , EKG interpreted , Old EKG reviewed, Old  chart reviewed, Radiograph reviewed , Notes from prior ED visits and [place] Controlled Substance Database    Pertinent labs & imaging results that were available during my care of the patient were reviewed by me and considered in my medical decision making (see chart for details).    ____________________________________________   FINAL  CLINICAL IMPRESSION(S) / ED DIAGNOSES  Final diagnoses:  Community acquired pneumonia of right lung, unspecified part of lung      NEW MEDICATIONS STARTED DURING THIS VISIT:  New Prescriptions   AMOXICILLIN-CLAVULANATE (AUGMENTIN) 875-125 MG TABLET    Take 1 tablet by mouth 2 (two) times daily.   DOXYCYCLINE (VIBRAMYCIN) 100 MG CAPSULE    Take 1 capsule (100 mg total) by mouth 2 (two) times daily.     Note:  This document was prepared using Dragon voice recognition software and may include unintentional dictation errors.    Rudene Re, MD 10/16/17 [phone]

## 2017-10-16 NOTE — Telephone Encounter (Signed)
Patient's wife called saying that the patient has been very short of breath and coughing up blood since 2:30am. She reports that the patient has these "coughing spells" and its hard to catch his breath. He was treated earlier this month for pneumonia with Levaquin. She is requesting that he be put on another round of abx. Per Dr. Caryn Section, the severe shortness of breath and coughing up blood concerns him and he recommended that the patient go to the ER for additional imaging and blood tests. Advised wife.

## 2017-10-16 NOTE — ED Triage Notes (Signed)
Since this AM he states increased SOB and some blood tinged sputum, states hx of COPD and recent pneumonia, pt on daily ASA, denies any pain, awake and alert

## 2017-10-16 NOTE — ED Notes (Signed)
Pt ambulated to restroom in the room without problem.  Oxygen saturation monitored and maintained 95% on room air.  Pt denies becoming SOB.

## 2017-10-19 ENCOUNTER — Encounter: Payer: Self-pay | Admitting: Family Medicine

## 2017-10-19 ENCOUNTER — Ambulatory Visit (INDEPENDENT_AMBULATORY_CARE_PROVIDER_SITE_OTHER): Payer: Medicare Other | Admitting: Family Medicine

## 2017-10-19 VITALS — BP 126/68 | HR 58 | Temp 97.8°F | Resp 16 | Wt 191.0 lb

## 2017-10-19 DIAGNOSIS — J181 Lobar pneumonia, unspecified organism: Secondary | ICD-10-CM | POA: Diagnosis not present

## 2017-10-19 DIAGNOSIS — J189 Pneumonia, unspecified organism: Secondary | ICD-10-CM

## 2017-10-19 NOTE — Progress Notes (Signed)
Patient: James [name] Male    DOB: [birth year]   83 y.o.   MRN: [id number] Visit Date: 10/19/2017  Today's Provider: Lelon Huh, MD   Chief Complaint  Patient presents with  . Hospitalization Follow-up   Subjective:    HPI   Follow up ER visit  Patient was seen in ER for [facility] on 10/16/2017. He was treated for Community acquired pneumonia of right lung, unspecified part of lung. Chest Xray revealed alveolar opacity. Treatment for this included Rocephin and magnesium via IV and he was discharged with prescription for Augmentin and Doxycycline. He reports good compliance with treatment. He reports this condition is Improved. Is much less short of breath. Still has cough, but much improved and  No longer coughing up blood.       Allergies  Allergen Reactions  . Indomethacin Nausea Only     Current Outpatient Prescriptions:  .  amoxicillin-clavulanate (AUGMENTIN) 875-125 MG tablet, Take 1 tablet by mouth 2 (two) times daily., Disp: 20 tablet, Rfl: 0 .  atorvastatin (LIPITOR) 80 MG tablet, TAKE 1 TABLET BY MOUTH DAILY, Disp: 90 tablet, Rfl: 4 .  azelastine (OPTIVAR) 0.05 % ophthalmic solution, Apply 1 drop to eye 2 (two) times daily., Disp: 6 mL, Rfl: 5 .  benazepril-hydrochlorthiazide (LOTENSIN HCT) 20-12.5 MG tablet, Take 0.5 tablets by mouth daily., Disp: 3 tablet, Rfl: 0 .  beta carotene w/minerals (OCUVITE) tablet, Take 1 tablet by mouth daily., Disp: , Rfl:  .  docusate sodium (COLACE) 100 MG capsule, Take 1 capsule (100 mg total) by mouth 2 (two) times daily., Disp: 60 capsule, Rfl: 0 .  doxycycline (VIBRAMYCIN) 100 MG capsule, Take 1 capsule (100 mg total) by mouth 2 (two) times daily., Disp: 20 capsule, Rfl: 0 .  fluticasone (FLONASE) 50 MCG/ACT nasal spray, Place 2 sprays into both nostrils daily. (Patient taking differently: Place 2 sprays into both nostrils every evening. ), Disp: 48 g, Rfl: 5 .  furosemide (LASIX) 20 MG tablet, Take 1 tablet (20 mg total)  by mouth daily., Disp: 30 tablet, Rfl: 0 .  HYDROcodone-acetaminophen (NORCO) 10-325 MG tablet, Take 1 tablet by mouth every 6 (six) hours as needed., Disp: 30 tablet, Rfl: 0 .  lansoprazole (PREVACID) 30 MG capsule, TAKE 1 CAPSULE BY MOUTH EVERY DAY, Disp: 30 capsule, Rfl: 12 .  LORazepam (ATIVAN) 1 MG tablet, TAKE 1 TABLET AT BEDTIME AS NEEDED FOR, Disp: 30 tablet, Rfl: 5 .  Magnesium 400 MG CAPS, Take 1 capsule by mouth daily. , Disp: , Rfl:  .  metoprolol (LOPRESSOR) 50 MG tablet, Take 1 tablet (50 mg total) by mouth 2 (two) times daily., Disp: 180 tablet, Rfl: 4 .  montelukast (SINGULAIR) 10 MG tablet, TAKE ONE TABLET BY MOUTH EVERY DAY, Disp: 30 tablet, Rfl: 12 .  MULTIPLE VITAMIN PO, Take 1 tablet by mouth daily. , Disp: , Rfl:  .  PROAIR HFA 108 (90 Base) MCG/ACT inhaler, INHALE 2 PUFFS INTO LUNGS EVERY 6 HOURS AS NEEDED FOR WHEEZING OR SHORTNESS OF BREATH, Disp: 8.5 g, Rfl: 5 .  SPIRIVA HANDIHALER 18 MCG inhalation capsule, INHALE 1 CAPSULE AS DIRECTED ONCE A DAY, Disp: 30 capsule, Rfl: 12 .  SYMBICORT 160-4.5 MCG/ACT inhaler, INHALE 2 PUFFS INTO THE LUNGS TWICE DAILY, Disp: 10.2 g, Rfl: 12 .  tamsulosin (FLOMAX) 0.4 MG CAPS capsule, Take 1 capsule (0.4 mg total) by mouth daily., Disp: 90 capsule, Rfl: 4 .  tiZANidine (ZANAFLEX) 4 MG tablet, TAKE ONE TABLET AT  BEDTIME (Patient taking differently: TAKE ONE TABLET AT BEDTIME AS NEEDED FOR SPASMS), Disp: 30 tablet, Rfl: 12 .  traMADol (ULTRAM) 50 MG tablet, Take 50 mg by mouth daily as needed., Disp: , Rfl: 0 .  chlorpheniramine-HYDROcodone (TUSSIONEX PENNKINETIC ER) 10-8 MG/5ML SUER, Take 5 mLs by mouth every 12 (twelve) hours as needed for cough. (Patient not taking: Reported on 10/19/2017), Disp: 240 mL, Rfl: 0 .  levofloxacin (LEVAQUIN) 500 MG tablet, Take 1 tablet (500 mg total) by mouth daily. (Patient not taking: Reported on 10/16/2017), Disp: 7 tablet, Rfl: 0  Review of Systems  Constitutional: Negative for appetite change, chills and  fever.  Respiratory: Negative for chest tightness, shortness of breath and wheezing.   Cardiovascular: Negative for chest pain and palpitations.  Gastrointestinal: Negative for abdominal pain, nausea and vomiting.    Social History  Substance Use Topics  . Smoking status: Former Smoker    Packs/day: 1.00    Years: 11.00    Types: Cigarettes  . Smokeless tobacco: Never Used     Comment: quit smoking 30 yrs ago  . Alcohol use 0.0 oz/week     Comment: beer occasionally   Objective:   BP 126/68 (BP Location: Left Arm, Patient Position: Sitting, Cuff Size: Normal)   Pulse (!) 58   Temp 97.8 F (36.6 C)   Resp 16   Wt 191 lb (86.6 kg)   SpO2 97%   BMI 29.04 kg/m     Physical Exam   General Appearance:    Alert, cooperative, no distress  Eyes:    PERRL, conjunctiva/corneas clear, EOM's intact       Lungs:     Faint mid right lung rales. respirations unlabored  Heart:    Regular rate and rhythm  Neurologic:   Awake, alert, oriented x 3. No apparent focal neurological           defect.           Assessment & Plan:     1. Pneumonia of right middle lobe due to infectious organism ([place]) Greatly improved after IV ceftriaxone and starting oral doxycycline and Augmentin. Recommend he take probiotic until finished.        Lelon Huh, MD  [facility]

## 2017-10-26 ENCOUNTER — Telehealth: Payer: Self-pay | Admitting: Family Medicine

## 2017-10-26 NOTE — Telephone Encounter (Signed)
Please check with patient to make sure he better since finishing antibiotic for pneumonia. Cough should be much better and he shouldn't be having any fever or shortness of breath.

## 2017-10-26 NOTE — Telephone Encounter (Signed)
-----   Message from Birdie Sons, MD sent at 10/19/2017  9:03 AM EDT ----- Regarding: call follow up pneumonia Make sure doesn't need to extend antibiotic > 10 days

## 2017-10-26 NOTE — Telephone Encounter (Signed)
Patient states that he took the last antibiotic this morning and he is feeling better. No cough, fever or sob.

## 2017-10-30 ENCOUNTER — Inpatient Hospital Stay: Payer: Medicare Other

## 2017-10-30 ENCOUNTER — Observation Stay
Admission: EM | Admit: 2017-10-30 | Discharge: 2017-10-31 | Disposition: A | Discharge status: Home / Self Care | Payer: Medicare Other | Attending: Internal Medicine | Admitting: Internal Medicine

## 2017-10-30 ENCOUNTER — Other Ambulatory Visit: Payer: Self-pay

## 2017-10-30 ENCOUNTER — Emergency Department: Payer: Medicare Other

## 2017-10-30 ENCOUNTER — Encounter: Payer: Self-pay | Admitting: *Deleted

## 2017-10-30 DIAGNOSIS — Z8051 Family history of malignant neoplasm of kidney: Secondary | ICD-10-CM | POA: Diagnosis not present

## 2017-10-30 DIAGNOSIS — Z8249 Family history of ischemic heart disease and other diseases of the circulatory system: Secondary | ICD-10-CM | POA: Diagnosis not present

## 2017-10-30 DIAGNOSIS — R29898 Other symptoms and signs involving the musculoskeletal system: Secondary | ICD-10-CM

## 2017-10-30 DIAGNOSIS — I2510 Atherosclerotic heart disease of native coronary artery without angina pectoris: Secondary | ICD-10-CM | POA: Insufficient documentation

## 2017-10-30 DIAGNOSIS — Z9049 Acquired absence of other specified parts of digestive tract: Secondary | ICD-10-CM | POA: Diagnosis not present

## 2017-10-30 DIAGNOSIS — I480 Paroxysmal atrial fibrillation: Secondary | ICD-10-CM | POA: Insufficient documentation

## 2017-10-30 DIAGNOSIS — R531 Weakness: Secondary | ICD-10-CM | POA: Diagnosis not present

## 2017-10-30 DIAGNOSIS — I6523 Occlusion and stenosis of bilateral carotid arteries: Secondary | ICD-10-CM | POA: Diagnosis not present

## 2017-10-30 DIAGNOSIS — Z888 Allergy status to other drugs, medicaments and biological substances status: Secondary | ICD-10-CM | POA: Diagnosis not present

## 2017-10-30 DIAGNOSIS — R471 Dysarthria and anarthria: Secondary | ICD-10-CM

## 2017-10-30 DIAGNOSIS — E871 Hypo-osmolality and hyponatremia: Secondary | ICD-10-CM | POA: Insufficient documentation

## 2017-10-30 DIAGNOSIS — H9190 Unspecified hearing loss, unspecified ear: Secondary | ICD-10-CM | POA: Diagnosis not present

## 2017-10-30 DIAGNOSIS — J449 Chronic obstructive pulmonary disease, unspecified: Secondary | ICD-10-CM | POA: Insufficient documentation

## 2017-10-30 DIAGNOSIS — Z8619 Personal history of other infectious and parasitic diseases: Secondary | ICD-10-CM | POA: Diagnosis not present

## 2017-10-30 DIAGNOSIS — Z951 Presence of aortocoronary bypass graft: Secondary | ICD-10-CM | POA: Diagnosis not present

## 2017-10-30 DIAGNOSIS — I639 Cerebral infarction, unspecified: Secondary | ICD-10-CM | POA: Diagnosis not present

## 2017-10-30 DIAGNOSIS — N400 Enlarged prostate without lower urinary tract symptoms: Secondary | ICD-10-CM | POA: Insufficient documentation

## 2017-10-30 DIAGNOSIS — Z9842 Cataract extraction status, left eye: Secondary | ICD-10-CM | POA: Insufficient documentation

## 2017-10-30 DIAGNOSIS — E785 Hyperlipidemia, unspecified: Secondary | ICD-10-CM | POA: Insufficient documentation

## 2017-10-30 DIAGNOSIS — Z8546 Personal history of malignant neoplasm of prostate: Secondary | ICD-10-CM | POA: Diagnosis not present

## 2017-10-30 DIAGNOSIS — I10 Essential (primary) hypertension: Secondary | ICD-10-CM | POA: Insufficient documentation

## 2017-10-30 DIAGNOSIS — M6281 Muscle weakness (generalized): Secondary | ICD-10-CM | POA: Diagnosis not present

## 2017-10-30 DIAGNOSIS — Z8673 Personal history of transient ischemic attack (TIA), and cerebral infarction without residual deficits: Secondary | ICD-10-CM | POA: Diagnosis present

## 2017-10-30 DIAGNOSIS — Z8052 Family history of malignant neoplasm of bladder: Secondary | ICD-10-CM | POA: Diagnosis not present

## 2017-10-30 DIAGNOSIS — Z7951 Long term (current) use of inhaled steroids: Secondary | ICD-10-CM | POA: Insufficient documentation

## 2017-10-30 DIAGNOSIS — Z96642 Presence of left artificial hip joint: Secondary | ICD-10-CM | POA: Insufficient documentation

## 2017-10-30 DIAGNOSIS — Z823 Family history of stroke: Secondary | ICD-10-CM | POA: Diagnosis not present

## 2017-10-30 DIAGNOSIS — Z79899 Other long term (current) drug therapy: Secondary | ICD-10-CM | POA: Diagnosis not present

## 2017-10-30 LAB — APTT: aPTT: 28 seconds (ref 24–36)

## 2017-10-30 LAB — COMPREHENSIVE METABOLIC PANEL
ALT: 25 U/L (ref 17–63)
AST: 30 U/L (ref 15–41)
Albumin: 3.6 g/dL (ref 3.5–5.0)
Alkaline Phosphatase: 81 U/L (ref 38–126)
Anion gap: 10 (ref 5–15)
BUN: 20 mg/dL (ref 6–20)
CO2: 27 mmol/L (ref 22–32)
Calcium: 8.9 mg/dL (ref 8.9–10.3)
Chloride: 95 mmol/L — ABNORMAL LOW (ref 101–111)
Creatinine, Ser: 1.15 mg/dL (ref 0.61–1.24)
GFR calc Af Amer: >60 mL/min (ref >60)
GFR calc non Af Amer: 56 mL/min — ABNORMAL LOW (ref >60)
Glucose, Bld: 103 mg/dL — ABNORMAL HIGH (ref 65–99)
Potassium: 4.5 mmol/L (ref 3.5–5.1)
Sodium: 132 mmol/L — ABNORMAL LOW (ref 135–145)
Total Bilirubin: 1.2 mg/dL (ref 0.3–1.2)
Total Protein: 6.5 g/dL (ref 6.5–8.1)

## 2017-10-30 LAB — DIFFERENTIAL
Basophils Absolute: 0 10*3/uL (ref 0–0.1)
Basophils Relative: 1 %
Eosinophils Absolute: 0.2 10*3/uL (ref 0–0.7)
Eosinophils Relative: 2 %
Lymphocytes Relative: 18 %
Lymphs Abs: 1.4 10*3/uL (ref 1.0–3.6)
Monocytes Absolute: 0.6 10*3/uL (ref 0.2–1.0)
Monocytes Relative: 8 %
Neutro Abs: 5.8 10*3/uL (ref 1.4–6.5)
Neutrophils Relative %: 71 %

## 2017-10-30 LAB — CBC
HCT: 41.9 % (ref 40.0–52.0)
Hemoglobin: 13.2 g/dL (ref 13.0–18.0)
MCH: 27.3 pg (ref 26.0–34.0)
MCHC: 31.5 g/dL — ABNORMAL LOW (ref 32.0–36.0)
MCV: 86.6 fL (ref 80.0–100.0)
Platelets: 187 10*3/uL (ref 150–440)
RBC: 4.84 MIL/uL (ref 4.40–5.90)
RDW: 15.5 % — ABNORMAL HIGH (ref 11.5–14.5)
WBC: 8 10*3/uL (ref 3.8–10.6)

## 2017-10-30 LAB — GLUCOSE, CAPILLARY: Glucose-Capillary: 97 mg/dL (ref 65–99)

## 2017-10-30 LAB — TROPONIN I: Troponin I: <0.03 ng/mL (ref <0.03)

## 2017-10-30 LAB — PROTIME-INR
INR: 0.91
Prothrombin Time: 12.2 seconds (ref 11.4–15.2)

## 2017-10-30 MED ORDER — SODIUM CHLORIDE 0.9 % IV SOLN
INTRAVENOUS | Status: DC
  Administered 2017-10-30 – 2017-10-31 (×2): via INTRAVENOUS

## 2017-10-30 MED ORDER — SENNOSIDES-DOCUSATE SODIUM 8.6-50 MG PO TABS: 1.0000 | ORAL_TABLET | Freq: Every evening | ORAL | Status: DC | PRN

## 2017-10-30 MED ORDER — STROKE: EARLY STAGES OF RECOVERY BOOK
Freq: Once | Status: AC
  Administered 2017-10-30: 15:00:00

## 2017-10-30 MED ORDER — DOCUSATE SODIUM 100 MG PO CAPS
100.0000 mg | ORAL_CAPSULE | Freq: Two times a day (BID) | ORAL | Status: DC
  Administered 2017-10-30 – 2017-10-31 (×2): 100 mg via ORAL
  Filled 2017-10-30 (×2): qty 1

## 2017-10-30 MED ORDER — MOMETASONE FURO-FORMOTEROL FUM 200-5 MCG/ACT IN AERO
2.0000 | INHALATION_SPRAY | Freq: Two times a day (BID) | RESPIRATORY_TRACT | Status: DC
  Administered 2017-10-30 – 2017-10-31 (×2): 2 via RESPIRATORY_TRACT
  Filled 2017-10-30: qty 8.8

## 2017-10-30 MED ORDER — FLUTICASONE PROPIONATE 50 MCG/ACT NA SUSP
2.0000 | Freq: Every evening | NASAL | Status: DC
  Administered 2017-10-30: 22:00:00 2 via NASAL
  Filled 2017-10-30: qty 16

## 2017-10-30 MED ORDER — ENOXAPARIN SODIUM 40 MG/0.4ML ~~LOC~~ SOLN
40.0000 mg | SUBCUTANEOUS | Status: DC
  Administered 2017-10-30: 22:00:00 40 mg via SUBCUTANEOUS
  Filled 2017-10-30: qty 0.4

## 2017-10-30 MED ORDER — BENAZEPRIL HCL 20 MG PO TABS
20.0000 mg | ORAL_TABLET | Freq: Every day | ORAL | Status: DC
  Administered 2017-10-31: 09:00:00 20 mg via ORAL
  Filled 2017-10-30: qty 1

## 2017-10-30 MED ORDER — ALBUTEROL SULFATE (2.5 MG/3ML) 0.083% IN NEBU
3.0000 mL | INHALATION_SOLUTION | RESPIRATORY_TRACT | Status: DC | PRN
  Administered 2017-10-31: 01:00:00 3 mL via RESPIRATORY_TRACT
  Filled 2017-10-30: qty 3

## 2017-10-30 MED ORDER — ACETAMINOPHEN 160 MG/5ML PO SOLN
650.0000 mg | ORAL | Status: DC | PRN
  Filled 2017-10-30: qty 20.3

## 2017-10-30 MED ORDER — METOPROLOL TARTRATE 50 MG PO TABS
50.0000 mg | ORAL_TABLET | Freq: Two times a day (BID) | ORAL | Status: DC
  Administered 2017-10-30 – 2017-10-31 (×2): 50 mg via ORAL
  Filled 2017-10-30 (×2): qty 1

## 2017-10-30 MED ORDER — ACETAMINOPHEN 325 MG PO TABS: 650.0000 mg | ORAL_TABLET | ORAL | Status: DC | PRN

## 2017-10-30 MED ORDER — TIOTROPIUM BROMIDE MONOHYDRATE 18 MCG IN CAPS
18.0000 ug | ORAL_CAPSULE | Freq: Every day | RESPIRATORY_TRACT | Status: DC
  Administered 2017-10-31: 18 ug via RESPIRATORY_TRACT
  Filled 2017-10-30: qty 5

## 2017-10-30 MED ORDER — PANTOPRAZOLE SODIUM 40 MG PO PACK
20.0000 mg | PACK | Freq: Every day | ORAL | Status: DC
  Administered 2017-10-30 – 2017-10-31 (×2): 20 mg via ORAL
  Filled 2017-10-30 (×2): qty 20

## 2017-10-30 MED ORDER — MAGNESIUM OXIDE 400 (241.3 MG) MG PO TABS
400.0000 mg | ORAL_TABLET | Freq: Every day | ORAL | Status: DC
  Administered 2017-10-31: 09:00:00 400 mg via ORAL
  Filled 2017-10-30: qty 1

## 2017-10-30 MED ORDER — CLOPIDOGREL BISULFATE 75 MG PO TABS
75.0000 mg | ORAL_TABLET | Freq: Every day | ORAL | Status: DC
  Administered 2017-10-30 – 2017-10-31 (×2): 75 mg via ORAL
  Filled 2017-10-30 (×2): qty 1

## 2017-10-30 MED ORDER — ASPIRIN EC 325 MG PO TBEC
325.0000 mg | DELAYED_RELEASE_TABLET | Freq: Every day | ORAL | Status: DC
  Administered 2017-10-31: 09:00:00 325 mg via ORAL
  Filled 2017-10-30: qty 1

## 2017-10-30 MED ORDER — ASPIRIN 300 MG RE SUPP: 300.0000 mg | Freq: Every day | RECTAL | Status: DC

## 2017-10-30 MED ORDER — BENAZEPRIL-HYDROCHLOROTHIAZIDE 20-12.5 MG PO TABS: 0.5000 | ORAL_TABLET | Freq: Every day | ORAL | Status: DC

## 2017-10-30 MED ORDER — HYDROCHLOROTHIAZIDE 12.5 MG PO CAPS
12.5000 mg | ORAL_CAPSULE | Freq: Every day | ORAL | Status: DC
  Administered 2017-10-31: 12.5 mg via ORAL
  Filled 2017-10-30: qty 1

## 2017-10-30 MED ORDER — ACETAMINOPHEN 650 MG RE SUPP: 650.0000 mg | RECTAL | Status: DC | PRN

## 2017-10-30 MED ORDER — ASPIRIN EC 325 MG PO TBEC
325.0000 mg | DELAYED_RELEASE_TABLET | Freq: Once | ORAL | Status: AC
  Administered 2017-10-30: 325 mg via ORAL
  Filled 2017-10-30: qty 1

## 2017-10-30 MED ORDER — ATORVASTATIN CALCIUM 20 MG PO TABS
80.0000 mg | ORAL_TABLET | Freq: Every day | ORAL | Status: DC
  Administered 2017-10-30: 18:00:00 80 mg via ORAL
  Filled 2017-10-30: qty 4

## 2017-10-30 MED ORDER — HYDROCHLOROTHIAZIDE 10 MG/ML ORAL SUSPENSION
6.2500 mg | Freq: Every day | ORAL | Status: DC
  Administered 2017-10-31: 6.25 mg via ORAL
  Filled 2017-10-30: qty 1.25

## 2017-10-30 MED ORDER — MONTELUKAST SODIUM 10 MG PO TABS
10.0000 mg | ORAL_TABLET | Freq: Every day | ORAL | Status: DC
  Administered 2017-10-30: 10 mg via ORAL
  Filled 2017-10-30: qty 1

## 2017-10-30 MED ORDER — FUROSEMIDE 20 MG PO TABS
20.0000 mg | ORAL_TABLET | Freq: Every day | ORAL | Status: DC
  Administered 2017-10-31: 20 mg via ORAL
  Filled 2017-10-30: qty 1

## 2017-10-30 MED ORDER — HYDROCODONE-ACETAMINOPHEN 10-325 MG PO TABS: 1.0000 | ORAL_TABLET | Freq: Four times a day (QID) | ORAL | Status: DC | PRN

## 2017-10-30 MED ORDER — HYDROCOD POLST-CPM POLST ER 10-8 MG/5ML PO SUER: 5.0000 mL | Freq: Two times a day (BID) | ORAL | Status: DC | PRN

## 2017-10-30 MED ORDER — BENAZEPRIL HCL 10 MG PO TABS
10.0000 mg | ORAL_TABLET | Freq: Every day | ORAL | Status: DC
  Administered 2017-10-31: 10 mg via ORAL
  Filled 2017-10-30: qty 1

## 2017-10-30 MED ORDER — TAMSULOSIN HCL 0.4 MG PO CAPS
0.4000 mg | ORAL_CAPSULE | Freq: Every day | ORAL | Status: DC
  Administered 2017-10-31: 09:00:00 0.4 mg via ORAL
  Filled 2017-10-30: qty 1

## 2017-10-30 NOTE — ED Notes (Signed)
Attempted to call wife to inform of room pt went to but no answer.

## 2017-10-30 NOTE — Evaluation (Signed)
Physical Therapy Evaluation Patient Details Name: James [name] MRN: [id number] DOB: 08-Feb-1929 Today's Date: 10/30/2017   History of Present Illness  83 y/o male who woke 11/13 with L UE numbness/coordination issues as well as slurred speech.  MRI reveals CVA.  Clinical Impression  Pt showed great effort with PT exam and was able to easily ambulate 200 ft w/o AD and no safety issues.  He is still having some sensory deficit in L UE as well as difficulty with handling/manipulating objects, but overall is safe to go home and functional.  We discussed HHPT vs outpt and they agreed that he would get much more out of outpatient and that getting there would be doable.  Overall pt is safe to go home with relatively minimal residual deficits.     Follow Up Recommendations Outpatient PT    Equipment Recommendations  None recommended by PT    Recommendations for Other Services       Precautions / Restrictions Precautions Precautions: Fall Restrictions Weight Bearing Restrictions: No      Mobility  Bed Mobility Overal bed mobility: Independent                Transfers Overall transfer level: Independent Equipment used: None             General transfer comment: Pt able to rise w/o issue, hesitation  Ambulation/Gait Ambulation/Gait assistance: Modified independent (Device/Increase time) Ambulation Distance (Feet): 200 Feet Assistive device: None       General Gait Details: Pt able to walk with consistent and confident cadence, good safety, some minimal fatigue (O2 drops to 90%, HR to 110s)  Stairs Stairs: Yes Stairs assistance: Modified independent (Device/Increase time) Stair Management: One rail Right Number of Stairs: 3 General stair comments: Pt able to negotiate up/down steps w/o issue  Wheelchair Mobility    Modified Rankin (Stroke Patients Only)       Balance Overall balance assessment: Independent                                            Pertinent Vitals/Pain Pain Assessment: No/denies pain    Home Living Family/patient expects to be discharged to:: Private residence Living Arrangements: Spouse/significant other Available Help at Discharge: Family Type of Home: House Home Access: Stairs to enter Entrance Stairs-Rails: Right Entrance Stairs-Number of Steps: 2          Prior Function Level of Independence: Independent         Comments: Pt able to be very active, out of the house QD     Hand Dominance        Extremity/Trunk Assessment   Upper Extremity Assessment Upper Extremity Assessment: Overall WFL for tasks assessed(decreased sensation in L UE, but functional strength/QofM)    Lower Extremity Assessment Lower Extremity Assessment: Overall WFL for tasks assessed       Communication   Communication: No difficulties(speech back to normal per wife/pt)  Cognition Arousal/Alertness: Awake/alert Behavior During Therapy: WFL for tasks assessed/performed Overall Cognitive Status: Within Functional Limits for tasks assessed                                        General Comments      Exercises     Assessment/Plan    PT Assessment Patient needs  continued PT services  PT Problem List Decreased activity tolerance;Decreased coordination;Impaired sensation;Decreased safety awareness;Cardiopulmonary status limiting activity       PT Treatment Interventions Gait training;Stair training;Functional mobility training;Therapeutic activities;Therapeutic exercise;Balance training;Neuromuscular re-education;Patient/family education    PT Goals (Current goals can be found in the Care Plan section)  Acute Rehab PT Goals Patient Stated Goal: go home PT Goal Formulation: With patient Time For Goal Achievement: 11/13/17 Potential to Achieve Goals: Good    Frequency 7X/week   Barriers to discharge        Co-evaluation               AM-PAC PT "6 Clicks" Daily  Activity  Outcome Measure Difficulty turning over in bed (including adjusting bedclothes, sheets and blankets)?: None Difficulty moving from lying on back to sitting on the side of the bed? : None Difficulty sitting down on and standing up from a chair with arms (e.g., wheelchair, bedside commode, etc,.)?: None Help needed moving to and from a bed to chair (including a wheelchair)?: None Help needed walking in hospital room?: None Help needed climbing 3-5 steps with a railing? : None 6 Click Score: 24    End of Session Equipment Utilized During Treatment: Gait belt Activity Tolerance: Patient tolerated treatment well Patient left: with call bell/phone within reach;with chair alarm set;with family/visitor present   PT Visit Diagnosis: Hemiplegia and hemiparesis;Other symptoms and signs involving the nervous system (R29.898) Hemiplegia - Right/Left: Left Hemiplegia - dominant/non-dominant: Non-dominant Hemiplegia - caused by: Cerebral infarction    Time: 4097-3532 PT Time Calculation (min) (ACUTE ONLY): 28 min   Charges:   PT Evaluation $PT Eval Low Complexity: 1 Low     PT G Codes:   PT G-Codes **NOT FOR INPATIENT CLASS** Functional Assessment Tool Used: AM-PAC 6 Clicks Basic Mobility Functional Limitation: Mobility: Walking and moving around Mobility: Walking and Moving Around Current Status (D9242): At least 1 percent but less than 20 percent impaired, limited or restricted Mobility: Walking and Moving Around Goal Status [phone]): 0 percent impaired, limited or restricted    Kreg Shropshire, DPT 10/30/2017, 4:48 PM

## 2017-10-30 NOTE — Progress Notes (Signed)
Patient transported to MRI, xray, echo, and [name].  Clarise Cruz, RN

## 2017-10-30 NOTE — ED Provider Notes (Signed)
[facility] Emergency Department Provider Note    First MD Initiated Contact with Patient 10/30/17 [phone]     (approximate)  I have reviewed the triage vital signs and the nursing notes.   HISTORY  Chief Complaint Numbness    HPI James [name] is a 83 y.o. male who presents with chief complaint of left upper extremity "clumsiness" and weakness associated with garbled speech since the patient woke up from sleep.  Patient went to bed last night feeling normal.  He denies any pain.  No chest pain.  Does have a history of paroxysmal A. fib and does take a daily aspirin.  Medical record shows history of TIA but the patient does not have any known deficits or persistent deficits.  Denies any trauma.  No change in the medications.  Past Medical History:  Diagnosis Date  . Arthritis   . Back pain   . CAD (coronary artery disease)   . Cataract    right  . COPD (chronic obstructive pulmonary disease) ([place])    SPiriva and SYmbicort daily. Albuterol as needed  . Diverticulosis   . Dyspnea    with exertion  . Enlarged prostate    takes Flomax daily  . GERD (gastroesophageal reflux disease)   . History of chicken pox   . History of gout   . History of measles   . History of mumps   . HOH (hard of hearing)   . Hyperglycemia   . Hyperlipidemia    takes Atorvastatin daily  . Hypertension    takes Metoprolol daily as well as Lotensin HCT  . Hypotension   . Joint pain   . Microscopic colitis   . PAF (paroxysmal atrial fibrillation) ([place]), RVR 03/14/2017  . Prostate cancer ([place])   . TIA (transient ischemic attack)   . Weakness    numbness and tingling.mainly on right   Family History  Problem Relation Age of Onset  . Heart attack Mother   . Stroke Father   . Heart attack Father   . Hypertension Father   . Heart attack Sister   . Bladder Cancer Brother   . Kidney cancer Brother   . Heart Problems Brother    Past Surgical History:  Procedure Laterality  Date  . APPENDECTOMY  1950  . CARDIAC CATHETERIZATION  05/29/2013   EF=40-45%. Moderate pulmonary hypertension. Infero/ lateral hypokinesis  . cataract surgery Left   . COLONOSCOPY    . CORONARY ARTERY BYPASS GRAFT  2011   x 5  . MRI BRAIN  06/07/2013   Mild chronic involutional changes. No acute abnormalities  . MRI of neck    . Myocardial Perfusion Scan  05/29/2013   Abnormal myocardial perfusion image consistent with myocardial infarction  . PROSTATE SURGERY  11/18/2012   radiation seed  . TOTAL HIP ARTHROPLASTY Left 1997   hip joint replacement   Patient Active Problem List   Diagnosis Date Noted  . Acute CVA (cerebrovascular accident) ([place]) 10/30/2017  . Lung mass 09/11/2017  . Hematoma of neck 03/14/2017  . PAF (paroxysmal atrial fibrillation) ([place]), RVR 03/14/2017  . Chronic systolic heart failure ([place]) 03/14/2017  . Dysphagia 03/13/2017  . Cervical spondylosis with myelopathy 03/07/2017  . Restrictive lung disease 09/26/2016  . Cervical neck pain with evidence of disc disease 07/04/2016  . PVC (premature ventricular contraction) 11/16/2015  . Abdominal pain 07/27/2015  . Adenocarcinoma of prostate ([place]) 07/27/2015  . Arthritis 07/27/2015  . Blood in feces 07/27/2015  .  BPH (benign prostatic hyperplasia) 07/27/2015  . COPD (chronic obstructive pulmonary disease) ([place]) 07/27/2015  . GERD (gastroesophageal reflux disease) 07/27/2015  . Gout 07/27/2015  . Hyperglycemia 07/27/2015  . Hypotension 07/27/2015  . Insomnia 07/27/2015  . Shortness of breath 07/27/2015  . TIA (transient ischemic attack) 07/27/2015  . Atrial fibrillation ([place]) 11/16/2014  . BP (high blood pressure) 10/13/2014  . Urinary hesitancy 09/02/2012  . ED (erectile dysfunction) of organic origin 06/10/2010  . CAD (coronary artery disease) 03/14/2010  . LBP (low back pain) 03/01/2009  . Allergic rhinitis 09/28/2008  . Essential (primary) hypertension 08/02/2007  . Hyperlipidemia 08/02/2007  .  Acquired spondylolisthesis 03/20/2005  . Diverticulosis of colon without hemorrhage 12/18/2000  . Arthropathy 12/18/1998      Prior to Admission medications   Medication Sig Start Date End Date Taking? Authorizing Provider  atorvastatin (LIPITOR) 80 MG tablet TAKE 1 TABLET BY MOUTH DAILY 10/08/17  Yes Birdie Sons, MD  azelastine (OPTIVAR) 0.05 % ophthalmic solution Apply 1 drop to eye 2 (two) times daily. 08/23/16  Yes Birdie Sons, MD  benazepril-hydrochlorthiazide (LOTENSIN HCT) 20-12.5 MG tablet Take 0.5 tablets by mouth daily. 07/10/17  Yes Birdie Sons, MD  beta carotene w/minerals (OCUVITE) tablet Take 1 tablet by mouth daily.   Yes [provider]  docusate sodium (COLACE) 100 MG capsule Take 1 capsule (100 mg total) by mouth 2 (two) times daily. Patient taking differently: Take 100 mg 2 (two) times daily as needed by mouth for mild constipation.  03/08/17  Yes Newman Pies, MD  fluticasone [facility]) 50 MCG/ACT nasal spray Place 2 sprays into both nostrils daily. Patient taking differently: Place 2 sprays into both nostrils every evening.  11/13/16  Yes Birdie Sons, MD  furosemide (LASIX) 20 MG tablet Take 1 tablet (20 mg total) by mouth daily. 03/15/17  Yes Newman Pies, MD  HYDROcodone-acetaminophen [facility]) 10-325 MG tablet Take 1 tablet by mouth every 6 (six) hours as needed. 07/10/17  Yes Birdie Sons, MD  lansoprazole (PREVACID) 30 MG capsule TAKE 1 CAPSULE BY MOUTH EVERY DAY 08/06/17  Yes Birdie Sons, MD  LORazepam (ATIVAN) 1 MG tablet TAKE 1 TABLET AT BEDTIME AS NEEDED FOR 10/15/17  Yes Birdie Sons, MD  Magnesium 400 MG CAPS Take 1 capsule by mouth daily.    Yes [provider]  metoprolol (LOPRESSOR) 50 MG tablet Take 1 tablet (50 mg total) by mouth 2 (two) times daily. 08/25/16  Yes Birdie Sons, MD  montelukast (SINGULAIR) 10 MG tablet TAKE ONE TABLET BY MOUTH EVERY DAY 07/18/17  Yes Birdie Sons, MD  MULTIPLE VITAMIN PO  Take 1 tablet by mouth daily.    Yes [provider]  Omega-3 Fatty Acids (FISH OIL) 1000 MG CAPS Take 1 capsule daily by mouth.   Yes [provider]  PROAIR HFA 108 (90 Base) MCG/ACT inhaler INHALE 2 PUFFS INTO LUNGS EVERY 6 HOURS AS NEEDED FOR WHEEZING OR SHORTNESS OF BREATH 08/13/17  Yes Laverle Hobby, MD  SPIRIVA HANDIHALER 18 MCG inhalation capsule INHALE 1 CAPSULE AS DIRECTED ONCE A DAY 01/16/17  Yes Birdie Sons, MD  SYMBICORT 160-4.5 MCG/ACT inhaler INHALE 2 PUFFS INTO THE LUNGS TWICE DAILY 07/23/17  Yes Birdie Sons, MD  tamsulosin (FLOMAX) 0.4 MG CAPS capsule Take 1 capsule (0.4 mg total) by mouth daily. 11/06/16  Yes Birdie Sons, MD  tiZANidine (ZANAFLEX) 4 MG tablet TAKE ONE TABLET AT BEDTIME Patient taking differently: TAKE ONE TABLET  AT BEDTIME AS NEEDED FOR SPASMS 01/07/17  Yes Birdie Sons, MD  chlorpheniramine-HYDROcodone [facility] PENNKINETIC ER) 10-8 MG/5ML SUER Take 5 mLs by mouth every 12 (twelve) hours as needed for cough. Patient not taking: Reported on 10/19/2017 08/08/17   Birdie Sons, MD  levofloxacin (LEVAQUIN) 500 MG tablet Take 1 tablet (500 mg total) by mouth daily. Patient not taking: Reported on 10/16/2017 09/17/17   Birdie Sons, MD    Allergies Indomethacin    Social History Social History   Tobacco Use  . Smoking status: Former Smoker    Packs/day: 1.00    Years: 11.00    Pack years: 11.00    Types: Cigarettes  . Smokeless tobacco: Never Used  . Tobacco comment: quit smoking 30 yrs ago  Substance Use Topics  . Alcohol use: Yes    Alcohol/week: 0.0 oz    Comment: beer occasionally  . Drug use: No    Review of Systems Patient denies headaches, rhinorrhea, blurry vision, numbness, shortness of breath, chest pain, edema, cough, abdominal pain, nausea, vomiting, diarrhea, dysuria, fevers, rashes or hallucinations unless otherwise stated above in  HPI. ____________________________________________   PHYSICAL EXAM:  VITAL SIGNS: Vitals:   10/30/17 1100 10/30/17 1132  BP: (!) 169/104 (!) 167/69  Pulse: 96 (!) 38  Resp: (!) 25 20  Temp:  98.8 F (37.1 C)  SpO2: 99% 100%    Constitutional: Alert and oriented.  in no acute distress. Eyes: Conjunctivae are normal.  Head: Atraumatic. Nose: No congestion/rhinnorhea. Mouth/Throat: Mucous membranes are moist.   Neck: No stridor. Painless ROM.  Cardiovascular: Normal rate, regular rhythm. Grossly normal heart sounds.  Good peripheral circulation. Respiratory: Normal respiratory effort.  No retractions. Lungs CTAB. Gastrointestinal: Soft and nontender. No distention. No abdominal bruits. No CVA tenderness. Musculoskeletal: No lower extremity tenderness nor edema.  No joint effusions. Neurologic:  CN- intact.  No facial droop, there is dysmetria and weakness of the left upper extremity with positive drift of the left upper extremity.  Also has some dysarthric speech.  No significant neglect  noted.  Normal heel to shin.  Sensation intact bilaterally. Normal speech and language. No gross focal neurologic deficits are appreciated. No gait instability. Skin:  Skin is warm, dry and intact. No rash noted. Psychiatric: Mood and affect are normal. Speech and behavior are normal.  ____________________________________________   LABS (all labs ordered are listed, but only abnormal results are displayed)  Results for orders placed or performed during the hospital encounter of 10/30/17 (from the past 24 hour(s))  Glucose, capillary     Status: None   Collection Time: 10/30/17  8:33 AM  Result Value Ref Range   Glucose-Capillary 97 65 - 99 mg/dL  Protime-INR     Status: None   Collection Time: 10/30/17  8:35 AM  Result Value Ref Range   Prothrombin Time 12.2 11.4 - 15.2 seconds   INR 0.91   APTT     Status: None   Collection Time: 10/30/17  8:35 AM  Result Value Ref Range   aPTT 28 24 -  36 seconds  CBC     Status: Abnormal   Collection Time: 10/30/17  8:35 AM  Result Value Ref Range   WBC 8.0 3.8 - 10.6 K/uL   RBC 4.84 4.40 - 5.90 MIL/uL   Hemoglobin 13.2 13.0 - 18.0 g/dL   HCT 41.9 40.0 - 52.0 %   MCV 86.6 80.0 - 100.0 fL   MCH 27.3 26.0 - 34.0 pg  MCHC 31.5 (L) 32.0 - 36.0 g/dL   RDW 15.5 (H) 11.5 - 14.5 %   Platelets 187 150 - 440 K/uL  Differential     Status: None   Collection Time: 10/30/17  8:35 AM  Result Value Ref Range   Neutrophils Relative % 71 %   Neutro Abs 5.8 1.4 - 6.5 K/uL   Lymphocytes Relative 18 %   Lymphs Abs 1.4 1.0 - 3.6 K/uL   Monocytes Relative 8 %   Monocytes Absolute 0.6 0.2 - 1.0 K/uL   Eosinophils Relative 2 %   Eosinophils Absolute 0.2 0 - 0.7 K/uL   Basophils Relative 1 %   Basophils Absolute 0.0 0 - 0.1 K/uL  Comprehensive metabolic panel     Status: Abnormal   Collection Time: 10/30/17  8:35 AM  Result Value Ref Range   Sodium 132 (L) 135 - 145 mmol/L   Potassium 4.5 3.5 - 5.1 mmol/L   Chloride 95 (L) 101 - 111 mmol/L   CO2 27 22 - 32 mmol/L   Glucose, Bld 103 (H) 65 - 99 mg/dL   BUN 20 6 - 20 mg/dL   Creatinine, Ser 1.15 0.61 - 1.24 mg/dL   Calcium 8.9 8.9 - 10.3 mg/dL   Total Protein 6.5 6.5 - 8.1 g/dL   Albumin 3.6 3.5 - 5.0 g/dL   AST 30 15 - 41 U/L   ALT 25 17 - 63 U/L   Alkaline Phosphatase 81 38 - 126 U/L   Total Bilirubin 1.2 0.3 - 1.2 mg/dL   GFR calc non Af Amer 56 (L) >60 mL/min   GFR calc Af Amer >60 >60 mL/min   Anion gap 10 5 - 15  Troponin I     Status: None   Collection Time: 10/30/17  8:35 AM  Result Value Ref Range   Troponin I <0.03 <0.03 ng/mL   ____________________________________________  EKG My review and personal interpretation at Time: 8:19   Indication: numbness  Rate: 65  Rhythm: sinus, occasional atrial paced rhythma Axis: left Other: no stemi, normal intervals ____________________________________________  RADIOLOGY  I personally reviewed all radiographic images ordered to  evaluate for the above acute complaints and reviewed radiology reports and findings.  These findings were personally discussed with the patient.  Please see medical record for radiology report.  ____________________________________________   PROCEDURES  Procedure(s) performed:  Procedures    Critical Care performed: no ____________________________________________   INITIAL IMPRESSION / ASSESSMENT AND PLAN / ED COURSE  Pertinent labs & imaging results that were available during my care of the patient were reviewed by me and considered in my medical decision making (see chart for details).  DDX: cva, tia, hypoglycemia, dehydration, electrolyte abnormality, dissection, sepsis   James [name] is a 83 y.o. who presents to the ED with symptoms as described above.  CT head shows no acute abnormality.  Metabolic workup is sent for the above differential but is fairly reassuring.  Based on his deficit I am concern for CVA and based on his age and risk factor I do believe the patient will require admission the hospital for further evaluation of his weakness and deficits.  Have discussed with the patient and available family all diagnostics and treatments performed thus far and all questions were answered to the best of my ability. The patient demonstrates understanding and agreement with plan.       ____________________________________________   FINAL CLINICAL IMPRESSION(S) / ED DIAGNOSES  Final diagnoses:  LUE weakness  Dysarthria  NEW MEDICATIONS STARTED DURING THIS VISIT:  This SmartLink is deprecated. Use AVSMEDLIST instead to display the medication list for a patient.   Note:  This document was prepared using Dragon voice recognition software and may include unintentional dictation errors.    Merlyn Lot, MD 10/30/17 1154

## 2017-10-30 NOTE — ED Triage Notes (Signed)
Pt states left arm numbness that began this AM when he woke up, wife states she feels as if he has had some garbled speech since he woke up, pt awake and alert, oriented x4, states it is hard to hold on to things with his left arm, grip strength strong

## 2017-10-30 NOTE — Progress Notes (Signed)
OT Cancellation Note  Patient Details Name: James [name] MRN: [id number] DOB: [birth year]   Cancelled Treatment:    Reason Eval/Treat Not Completed: Patient at procedure or test/ unavailable. Order received, chart reviewed. Pt out of room for testing. Will re-attempt OT evaluation at later date/time as pt is available and medically appropriate.  Jeni Salles, MPH, MS, OTR/L ascom [phone] 10/30/17, 12:08 PM

## 2017-10-30 NOTE — H&P (Signed)
[facility] NAME: James [name]    MR#:  [id number]  DATE OF BIRTH:  17-Sep-1929  DATE OF ADMISSION:  10/30/2017  PRIMARY CARE PHYSICIAN: Birdie Sons, MD   REQUESTING/REFERRING PHYSICIAN: Merlyn Lot, MD  CHIEF COMPLAINT:   Chief Complaint  Patient presents with  . Numbness   Left hand weakness and numbness today. HISTORY OF PRESENT ILLNESS:  James [name]  is a 83 y.o. male with a known history of CAD, hypertension, COPD, TIA and PAF.  The patient presents the ED with above chief complaints.  The patient is alert, awake and oriented.  He felt left hand numbness and weakness, dropped thing this morning.  His wife also noticed that he has slurred speech.  The patient denies any other symptoms.  Dr. Quentin Cornwall requested admission for acute CVA.  PAST MEDICAL HISTORY:   Past Medical History:  Diagnosis Date  . Arthritis   . Back pain   . CAD (coronary artery disease)   . Cataract    right  . COPD (chronic obstructive pulmonary disease) ([place])    SPiriva and SYmbicort daily. Albuterol as needed  . Diverticulosis   . Dyspnea    with exertion  . Enlarged prostate    takes Flomax daily  . GERD (gastroesophageal reflux disease)   . History of chicken pox   . History of gout   . History of measles   . History of mumps   . HOH (hard of hearing)   . Hyperglycemia   . Hyperlipidemia    takes Atorvastatin daily  . Hypertension    takes Metoprolol daily as well as Lotensin HCT  . Hypotension   . Joint pain   . Microscopic colitis   . PAF (paroxysmal atrial fibrillation) ([place]), RVR 03/14/2017  . Prostate cancer ([place])   . TIA (transient ischemic attack)   . Weakness    numbness and tingling.mainly on right    PAST SURGICAL HISTORY:   Past Surgical History:  Procedure Laterality Date  . APPENDECTOMY  1950  . CARDIAC CATHETERIZATION  05/29/2013   EF=40-45%. Moderate pulmonary hypertension. Infero/ lateral  hypokinesis  . cataract surgery Left   . COLONOSCOPY    . CORONARY ARTERY BYPASS GRAFT  2011   x 5  . MRI BRAIN  06/07/2013   Mild chronic involutional changes. No acute abnormalities  . MRI of neck    . Myocardial Perfusion Scan  05/29/2013   Abnormal myocardial perfusion image consistent with myocardial infarction  . PROSTATE SURGERY  11/18/2012   radiation seed  . TOTAL HIP ARTHROPLASTY Left 1997   hip joint replacement    SOCIAL HISTORY:   Social History   Tobacco Use  . Smoking status: Former Smoker    Packs/day: 1.00    Years: 11.00    Pack years: 11.00    Types: Cigarettes  . Smokeless tobacco: Never Used  . Tobacco comment: quit smoking 30 yrs ago  Substance Use Topics  . Alcohol use: Yes    Alcohol/week: 0.0 oz    Comment: beer occasionally    FAMILY HISTORY:   Family History  Problem Relation Age of Onset  . Heart attack Mother   . Stroke Father   . Heart attack Father   . Hypertension Father   . Heart attack Sister   . Bladder Cancer Brother   . Kidney cancer Brother   . Heart Problems Brother     DRUG ALLERGIES:  Allergies  Allergen Reactions  . Indomethacin Nausea Only    REVIEW OF SYSTEMS:   Review of Systems  Constitutional: Negative for chills, fever and malaise/fatigue.  HENT: Negative for sore throat.   Eyes: Negative for blurred vision and double vision.  Respiratory: Negative for cough, hemoptysis, shortness of breath, wheezing and stridor.   Cardiovascular: Negative for chest pain, palpitations, orthopnea and leg swelling.  Gastrointestinal: Negative for abdominal pain, blood in stool, diarrhea, melena, nausea and vomiting.  Genitourinary: Negative for dysuria, flank pain and hematuria.  Musculoskeletal: Negative for back pain and joint pain.  Skin: Negative for rash.  Neurological: Positive for tingling, sensory change, speech change and focal weakness. Negative for dizziness, seizures, loss of consciousness, weakness and  headaches.  Endo/Heme/Allergies: Negative for polydipsia.  Psychiatric/Behavioral: Negative for depression. The patient is not nervous/anxious.     MEDICATIONS AT HOME:   Prior to Admission medications   Medication Sig Start Date End Date Taking? Authorizing Provider  atorvastatin (LIPITOR) 80 MG tablet TAKE 1 TABLET BY MOUTH DAILY 10/08/17  Yes Birdie Sons, MD  azelastine (OPTIVAR) 0.05 % ophthalmic solution Apply 1 drop to eye 2 (two) times daily. 08/23/16  Yes Birdie Sons, MD  benazepril-hydrochlorthiazide (LOTENSIN HCT) 20-12.5 MG tablet Take 0.5 tablets by mouth daily. 07/10/17  Yes Birdie Sons, MD  beta carotene w/minerals (OCUVITE) tablet Take 1 tablet by mouth daily.   Yes [provider]  docusate sodium (COLACE) 100 MG capsule Take 1 capsule (100 mg total) by mouth 2 (two) times daily. Patient taking differently: Take 100 mg 2 (two) times daily as needed by mouth for mild constipation.  03/08/17  Yes Newman Pies, MD  fluticasone [facility]) 50 MCG/ACT nasal spray Place 2 sprays into both nostrils daily. Patient taking differently: Place 2 sprays into both nostrils every evening.  11/13/16  Yes Birdie Sons, MD  furosemide (LASIX) 20 MG tablet Take 1 tablet (20 mg total) by mouth daily. 03/15/17  Yes Newman Pies, MD  HYDROcodone-acetaminophen [facility]) 10-325 MG tablet Take 1 tablet by mouth every 6 (six) hours as needed. 07/10/17  Yes Birdie Sons, MD  lansoprazole (PREVACID) 30 MG capsule TAKE 1 CAPSULE BY MOUTH EVERY DAY 08/06/17  Yes Birdie Sons, MD  LORazepam (ATIVAN) 1 MG tablet TAKE 1 TABLET AT BEDTIME AS NEEDED FOR 10/15/17  Yes Birdie Sons, MD  Magnesium 400 MG CAPS Take 1 capsule by mouth daily.    Yes [provider]  metoprolol (LOPRESSOR) 50 MG tablet Take 1 tablet (50 mg total) by mouth 2 (two) times daily. 08/25/16  Yes Birdie Sons, MD  montelukast (SINGULAIR) 10 MG tablet TAKE ONE TABLET BY MOUTH EVERY DAY 07/18/17   Yes Birdie Sons, MD  MULTIPLE VITAMIN PO Take 1 tablet by mouth daily.    Yes [provider]  Omega-3 Fatty Acids (FISH OIL) 1000 MG CAPS Take 1 capsule daily by mouth.   Yes [provider]  PROAIR HFA 108 (90 Base) MCG/ACT inhaler INHALE 2 PUFFS INTO LUNGS EVERY 6 HOURS AS NEEDED FOR WHEEZING OR SHORTNESS OF BREATH 08/13/17  Yes Laverle Hobby, MD  SPIRIVA HANDIHALER 18 MCG inhalation capsule INHALE 1 CAPSULE AS DIRECTED ONCE A DAY 01/16/17  Yes Birdie Sons, MD  SYMBICORT 160-4.5 MCG/ACT inhaler INHALE 2 PUFFS INTO THE LUNGS TWICE DAILY 07/23/17  Yes Birdie Sons, MD  tamsulosin (FLOMAX) 0.4 MG CAPS capsule Take 1 capsule (0.4 mg total) by mouth daily. 11/06/16  Yes Birdie Sons, MD  tiZANidine (ZANAFLEX) 4 MG tablet TAKE ONE TABLET AT BEDTIME Patient taking differently: TAKE ONE TABLET AT BEDTIME AS NEEDED FOR SPASMS 01/07/17  Yes Birdie Sons, MD  chlorpheniramine-HYDROcodone (TUSSIONEX PENNKINETIC ER) 10-8 MG/5ML SUER Take 5 mLs by mouth every 12 (twelve) hours as needed for cough. Patient not taking: Reported on 10/19/2017 08/08/17   Birdie Sons, MD  levofloxacin (LEVAQUIN) 500 MG tablet Take 1 tablet (500 mg total) by mouth daily. Patient not taking: Reported on 10/16/2017 09/17/17   Birdie Sons, MD      VITAL SIGNS:  Blood pressure (!) 149/77, pulse 75, temperature 97.8 F (36.6 C), temperature source Oral, resp. rate 14, height 5\' 8"  (1.727 m), weight 189 lb (85.7 kg), SpO2 100 %.  PHYSICAL EXAMINATION:  Physical Exam  GENERAL:  83 y.o.-year-old patient lying in the bed with no acute distress.  EYES: Pupils equal, round, reactive to light and accommodation. No scleral icterus. Extraocular muscles intact.  HEENT: Head atraumatic, normocephalic. Oropharynx and nasopharynx clear.  NECK:  Supple, no jugular venous distention. No thyroid enlargement, no tenderness.  LUNGS: Normal breath sounds bilaterally, no wheezing, rales,rhonchi or  crepitation. No use of accessory muscles of respiration.  CARDIOVASCULAR: S1, S2 normal. No murmurs, rubs, or gallops.  ABDOMEN: Soft, nontender, nondistended. Bowel sounds present. No organomegaly or mass.  EXTREMITIES: No pedal edema, cyanosis, or clubbing.  NEUROLOGIC: Cranial nerves II through XII are intact. Muscle strength 5/5 in all extremities.  Mild weakness on the left hand.  Sensation intact. Gait not checked.  PSYCHIATRIC: The patient is alert and oriented x 3.  SKIN: No obvious rash, lesion, or ulcer.   LABORATORY PANEL:   CBC Recent Labs  Lab 10/30/17 0835  WBC 8.0  HGB 13.2  HCT 41.9  PLT 187   ------------------------------------------------------------------------------------------------------------------  Chemistries  Recent Labs  Lab 10/30/17 0835  NA 132*  K 4.5  CL 95*  CO2 27  GLUCOSE 103*  BUN 20  CREATININE 1.15  CALCIUM 8.9  AST 30  ALT 25  ALKPHOS 81  BILITOT 1.2   ------------------------------------------------------------------------------------------------------------------  Cardiac Enzymes Recent Labs  Lab 10/30/17 0835  TROPONINI <0.03   ------------------------------------------------------------------------------------------------------------------  RADIOLOGY:  Ct Head Wo Contrast  Result Date: 10/30/2017 CLINICAL DATA:  Upper extremity weakness on the left EXAM: CT HEAD WITHOUT CONTRAST TECHNIQUE: Contiguous axial images were obtained from the base of the skull through the vertex without intravenous contrast. COMPARISON:  06/04/2013 FINDINGS: Brain: No evidence of acute infarction, hemorrhage, hydrocephalus, extra-axial collection or mass lesion/mass effect. Vascular: No hyperdense vessel or unexpected calcification. Skull: Normal. Negative for fracture or focal lesion. Sinuses/Orbits: No acute finding. Other: None. IMPRESSION: No acute intracranial abnormality noted. Electronically Signed   By: Inez Catalina M.D.   On: 10/30/2017  08:35      IMPRESSION AND PLAN:   Acute CVA. The patient will be admitted to medical floor.  He is given aspirin 325 mg 1 dose in ED, I will add Plavix, continue Lipitor, follow-up MRI/MRA of the brain, echocardiogram and carotid duplex. Neurology consult and neuro check.  PT OT evaluation.  Hyponatremia.  NS IV in the follow-up BMP. Hypertension.  Continue hypertension medication. COPD.  Continue home nebulizer.  All the records are reviewed and case discussed with ED provider. Management plans discussed with the patient, family and they are in agreement.  CODE STATUS: Full code  TOTAL TIME TAKING CARE OF THIS PATIENT: 53 minutes.    Bridgett Larsson, Sheppard Evens  M.D on 10/30/2017 at 10:53 AM  Between 7am to 6pm - Pager - [phone]  After 6pm go to [email or web address] - Proofreader  [employer] [facility] [place]  Office  [phone]  CC: Primary care physician; Birdie Sons, MD   Note: This dictation was prepared with Dragon dictation along with smaller phrase technology. Any transcriptional errors that result from this process are unin

## 2017-10-31 ENCOUNTER — Encounter: Payer: Self-pay | Admitting: Family Medicine

## 2017-10-31 ENCOUNTER — Inpatient Hospital Stay
Admit: 2017-10-31 | Discharge: 2017-10-31 | Disposition: A | Discharge status: Home / Self Care | Payer: Medicare Other | Attending: Internal Medicine | Admitting: Internal Medicine

## 2017-10-31 ENCOUNTER — Telehealth: Payer: Self-pay | Admitting: Family Medicine

## 2017-10-31 DIAGNOSIS — I4891 Unspecified atrial fibrillation: Secondary | ICD-10-CM | POA: Diagnosis not present

## 2017-10-31 DIAGNOSIS — I6389 Other cerebral infarction: Secondary | ICD-10-CM | POA: Diagnosis not present

## 2017-10-31 DIAGNOSIS — I2510 Atherosclerotic heart disease of native coronary artery without angina pectoris: Secondary | ICD-10-CM | POA: Diagnosis not present

## 2017-10-31 DIAGNOSIS — I639 Cerebral infarction, unspecified: Secondary | ICD-10-CM

## 2017-10-31 DIAGNOSIS — I679 Cerebrovascular disease, unspecified: Secondary | ICD-10-CM | POA: Insufficient documentation

## 2017-10-31 DIAGNOSIS — I10 Essential (primary) hypertension: Secondary | ICD-10-CM | POA: Diagnosis not present

## 2017-10-31 LAB — LIPID PANEL
Cholesterol: 106 mg/dL (ref 0–200)
HDL: 52 mg/dL (ref >40)
LDL Cholesterol: 45 mg/dL (ref 0–99)
Total CHOL/HDL Ratio: 2 RATIO
Triglycerides: 47 mg/dL (ref <150)
VLDL: 9 mg/dL (ref 0–40)

## 2017-10-31 LAB — HEMOGLOBIN A1C
Hgb A1c MFr Bld: 6.1 % — ABNORMAL HIGH (ref 4.8–5.6)
Mean Plasma Glucose: 128.37 mg/dL

## 2017-10-31 LAB — ECHOCARDIOGRAM COMPLETE
Height: 68 in
Weight: 3013 oz

## 2017-10-31 MED ORDER — APIXABAN 5 MG PO TABS: 5.0000 mg | ORAL_TABLET | Freq: Two times a day (BID) | ORAL | 0 refills | Status: DC

## 2017-10-31 MED ORDER — APIXABAN 5 MG PO TABS
5.0000 mg | ORAL_TABLET | Freq: Two times a day (BID) | ORAL | 0 refills | Status: DC
Start: 2017-10-31 — End: 2017-10-31

## 2017-10-31 MED ORDER — CLOPIDOGREL BISULFATE 75 MG PO TABS: 75.0000 mg | ORAL_TABLET | Freq: Every day | ORAL | 0 refills | Status: AC

## 2017-10-31 MED ORDER — ASPIRIN 81 MG PO CHEW: 81.0000 mg | CHEWABLE_TABLET | Freq: Every day | ORAL | 0 refills | Status: DC

## 2017-10-31 NOTE — Progress Notes (Signed)
*  PRELIMINARY RESULTS* Echocardiogram 2D Echocardiogram has been performed.  James [name] 10/31/2017, 10:26 AM

## 2017-10-31 NOTE — Discharge Summary (Addendum)
[facility] NAME: James [name]    MR#:  [id number]  DATE OF BIRTH:  [birth year]  DATE OF ADMISSION:  10/30/2017 ADMITTING PHYSICIAN: Demetrios Loll, MD  DATE OF DISCHARGE: 10/31/2017  PRIMARY CARE PHYSICIAN: Birdie Sons, MD    ADMISSION DIAGNOSIS:  LUE weakness [R29.898] Dysarthria [R47.1]  DISCHARGE DIAGNOSIS:  Active Problems:   Acute CVA (cerebrovascular accident) ([place])   SECONDARY DIAGNOSIS:   Past Medical History:  Diagnosis Date  . Arthritis   . Back pain   . CAD (coronary artery disease)   . Cataract    right  . COPD (chronic obstructive pulmonary disease) ([place])    SPiriva and SYmbicort daily. Albuterol as needed  . Diverticulosis   . Dyspnea    with exertion  . Enlarged prostate    takes Flomax daily  . GERD (gastroesophageal reflux disease)   . History of chicken pox   . History of gout   . History of measles   . History of mumps   . HOH (hard of hearing)   . Hyperglycemia   . Hyperlipidemia    takes Atorvastatin daily  . Hypertension    takes Metoprolol daily as well as Lotensin HCT  . Hypotension   . Joint pain   . Microscopic colitis   . PAF (paroxysmal atrial fibrillation) ([place]), RVR 03/14/2017  . Prostate cancer ([place])   . TIA (transient ischemic attack)   . Weakness    numbness and tingling.mainly on right    HOSPITAL COURSE:   83 y/o male with PAF and CAD presented to the emergency room with left upper extremity numbness" difficulties as well as slurred speech.  1. Acute infarction on the right affecting the deep insula and frontoparietal junction region, about 5 cm in size. Consistent withright M3 branch vessel occlusion:  Patient was seen and evaluated by neurology as well as physical therapy and occupational therapy. Patient will be discharged on baby aspirin and Plavix for 5 days then we will start aspirin 81 mg with Eliquis, given history of PAF. He will continue atorvastatin. LDL is  45 Carotid ultrasound showed no hemodynamically significant stenosis. Echocardiogram showed normal ejection fraction without evidence of thrombi and no major valvular abnormalities.   2. PAF: He was in NSR while in the hospital but CHADS VASC score >4 and therefore he will benefit from NOAC> Side effects, alternatives, risks and benefits were discussed with him and his wife.   3. Essential hypertension: Patient will continue Benzapril, HCTZ, metoprolol  4. Hyponatremia: This is improved. Sodium should be monitored while on HCTZ. Lasix stopped for now and can be restarted after he has seen PCP and BMP checked again.  He was on HCTZ while in the hospital and sodium did improve. Hyponatremia likely due to CVA  5. CAD: Patient will continue aspirin, statin and metoprolol  6. BPH: Continue Flomax  DISCHARGE CONDITIONS AND DIET:   Stable for discharge on cardiac diet  CONSULTS OBTAINED:  Treatment Team:  Alexis Goodell, MD  DRUG ALLERGIES:   Allergies  Allergen Reactions  . Indomethacin Nausea Only    DISCHARGE MEDICATIONS:   Current Discharge Medication List    START taking these medications   Details  apixaban (ELIQUIS) 5 MG TABS tablet Take 1 tablet (5 mg total) 2 (two) times daily by mouth. Start on 11/20 affter stopping PLAVIX and take this with baby aspirin Qty: 60 tablet, Refills: 0    aspirin (ASPIRIN CHILDRENS) 81  MG chewable tablet Chew 1 tablet (81 mg total) daily by mouth. Qty: 120 tablet, Refills: 0    clopidogrel (PLAVIX) 75 MG tablet Take 1 tablet (75 mg total) daily for 5 days by mouth. Take with baby aspirin 81 mg daily for 5 days (Stop Plavix on 11/19) then take Eliquis (start on 11/20) and baby ASA and stop plavix.  DO NOT TAKE ASPIRIN, PLAVIX AND ELIQUIS TOGETHER Qty: 5 tablet, Refills: 0      CONTINUE these medications which have NOT CHANGED   Details  atorvastatin (LIPITOR) 80 MG tablet TAKE 1 TABLET BY MOUTH DAILY Qty: 90 tablet, Refills: 4     azelastine (OPTIVAR) 0.05 % ophthalmic solution Apply 1 drop to eye 2 (two) times daily. Qty: 6 mL, Refills: 5    benazepril-hydrochlorthiazide (LOTENSIN HCT) 20-12.5 MG tablet Take 0.5 tablets by mouth daily. Qty: 3 tablet, Refills: 0    beta carotene w/minerals (OCUVITE) tablet Take 1 tablet by mouth daily.    docusate sodium (COLACE) 100 MG capsule Take 1 capsule (100 mg total) by mouth 2 (two) times daily. Qty: 60 capsule, Refills: 0    fluticasone (FLONASE) 50 MCG/ACT nasal spray Place 2 sprays into both nostrils daily. Qty: 48 g, Refills: 5    HYDROcodone-acetaminophen (NORCO) 10-325 MG tablet Take 1 tablet by mouth every 6 (six) hours as needed. Qty: 30 tablet, Refills: 0   Associated Diagnoses: Neck pain    lansoprazole (PREVACID) 30 MG capsule TAKE 1 CAPSULE BY MOUTH EVERY DAY Qty: 30 capsule, Refills: 12    LORazepam (ATIVAN) 1 MG tablet TAKE 1 TABLET AT BEDTIME AS NEEDED FOR Qty: 30 tablet, Refills: 5    Magnesium 400 MG CAPS Take 1 capsule by mouth daily.     metoprolol (LOPRESSOR) 50 MG tablet Take 1 tablet (50 mg total) by mouth 2 (two) times daily. Qty: 180 tablet, Refills: 4    montelukast (SINGULAIR) 10 MG tablet TAKE ONE TABLET BY MOUTH EVERY DAY Qty: 30 tablet, Refills: 12    MULTIPLE VITAMIN PO Take 1 tablet by mouth daily.     Omega-3 Fatty Acids (FISH OIL) 1000 MG CAPS Take 1 capsule daily by mouth.    PROAIR HFA 108 (90 Base) MCG/ACT inhaler INHALE 2 PUFFS INTO LUNGS EVERY 6 HOURS AS NEEDED FOR WHEEZING OR SHORTNESS OF BREATH Qty: 8.5 g, Refills: 5    SPIRIVA HANDIHALER 18 MCG inhalation capsule INHALE 1 CAPSULE AS DIRECTED ONCE A DAY Qty: 30 capsule, Refills: 12   Associated Diagnoses: Shortness of breath    SYMBICORT 160-4.5 MCG/ACT inhaler INHALE 2 PUFFS INTO THE LUNGS TWICE DAILY Qty: 10.2 g, Refills: 12    tamsulosin (FLOMAX) 0.4 MG CAPS capsule Take 1 capsule (0.4 mg total) by mouth daily. Qty: 90 capsule, Refills: 4    tiZANidine  (ZANAFLEX) 4 MG tablet TAKE ONE TABLET AT BEDTIME Qty: 30 tablet, Refills: 12   Associated Diagnoses: Cervical neck pain with evidence of disc disease      STOP taking these medications     furosemide (LASIX) 20 MG tablet      chlorpheniramine-HYDROcodone (TUSSIONEX PENNKINETIC ER) 10-8 MG/5ML SUER      levofloxacin (LEVAQUIN) 500 MG tablet           Today   CHIEF COMPLAINT:   Doing well this morning. Still some sensory deficits on the left side   VITAL SIGNS:  Blood pressure (!) 179/78, pulse (!) 101, temperature 98.4 F (36.9 C), temperature source Oral, resp. rate 18,  height 5\' 8"  (1.727 m), weight 85.4 kg (188 lb 5 oz), SpO2 97 %.   REVIEW OF SYSTEMS:  Review of Systems  Constitutional: Negative.  Negative for chills, fever and malaise/fatigue.  HENT: Negative.  Negative for ear discharge, ear pain, hearing loss, nosebleeds and sore throat.   Eyes: Negative.  Negative for blurred vision and pain.  Respiratory: Negative.  Negative for cough, hemoptysis, shortness of breath and wheezing.   Cardiovascular: Negative.  Negative for chest pain, palpitations and leg swelling.  Gastrointestinal: Negative.  Negative for abdominal pain, blood in stool, diarrhea, nausea and vomiting.  Genitourinary: Negative.  Negative for dysuria.  Musculoskeletal: Negative.  Negative for back pain.  Skin: Negative.   Neurological: Positive for sensory change. Negative for dizziness, tremors, speech change, focal weakness, seizures and headaches.  Endo/Heme/Allergies: Negative.  Does not bruise/bleed easily.  Psychiatric/Behavioral: Negative.  Negative for depression, hallucinations and suicidal ideas.     PHYSICAL EXAMINATION:  GENERAL:  83 y.o.-year-old patient lying in the bed with no acute distress.  NECK:  Supple, no jugular venous distention. No thyroid enlargement, no tenderness.  LUNGS: Normal breath sounds bilaterally, no wheezing, rales,rhonchi  No use of accessory muscles of  respiration.  CARDIOVASCULAR: S1, S2 normal. No murmurs, rubs, or gallops.  ABDOMEN: Soft, non-tender, non-distended. Bowel sounds present. No organomegaly or mass.  EXTREMITIES: No pedal edema, cyanosis, or clubbing.  PSYCHIATRIC: The patient is alert and oriented x 3.  SKIN: No obvious rash, lesion, or ulcer.  NEURO decreased sensation left arm and coordination off a bit but improved from admission  DATA REVIEW:   CBC Recent Labs  Lab 10/30/17 0835  WBC 8.0  HGB 13.2  HCT 41.9  PLT 187    Chemistries  Recent Labs  Lab 10/30/17 0835  NA 132*  K 4.5  CL 95*  CO2 27  GLUCOSE 103*  BUN 20  CREATININE 1.15  CALCIUM 8.9  AST 30  ALT 25  ALKPHOS 81  BILITOT 1.2    Cardiac Enzymes Recent Labs  Lab 10/30/17 0835  TROPONINI <0.03    Microbiology Results  @[email or web address] @  RADIOLOGY:  Dg Chest 2 View  Result Date: 10/30/2017 CLINICAL DATA:  Weakness EXAM: CHEST  2 VIEW COMPARISON:  10/16/2017 FINDINGS: Cardiac shadow is stable. Aortic calcifications are again seen. Postsurgical changes are again noted. Elevation of the right hemidiaphragm is again seen with right basilar atelectasis. Previously seen alveolar changes on the right have resolved in the interval. No sizable effusion is noted. IMPRESSION: Chronic changes without acute abnormality. Electronically Signed   By: Inez Catalina M.D.   On: 10/30/2017 14:13   Ct Head Wo Contrast  Result Date: 10/30/2017 CLINICAL DATA:  Upper extremity weakness on the left EXAM: CT HEAD WITHOUT CONTRAST TECHNIQUE: Contiguous axial images were obtained from the base of the skull through the vertex without intravenous contrast. COMPARISON:  06/04/2013 FINDINGS: Brain: No evidence of acute infarction, hemorrhage, hydrocephalus, extra-axial collection or mass lesion/mass effect. Vascular: No hyperdense vessel or unexpected calcification. Skull: Normal. Negative for fracture or focal lesion. Sinuses/Orbits: No acute finding. Other: None.  IMPRESSION: No acute intracranial abnormality noted. Electronically Signed   By: Inez Catalina M.D.   On: 10/30/2017 08:35   Mr Brain Wo Contrast  Result Date: 10/30/2017 CLINICAL DATA:  Episode of left hand numbness in weakness with some slurred speech. EXAM: MRI HEAD WITHOUT CONTRAST MRA HEAD WITHOUT CONTRAST TECHNIQUE: Multiplanar, multiecho pulse sequences of the brain and surrounding structures were obtained without intravenous  contrast. Angiographic images of the head were obtained using MRA technique without contrast. COMPARISON:  06/07/2013 MRI.  Head CT today. FINDINGS: MRI HEAD FINDINGS Brain: There is a 5 cm region of acute infarction affecting the deep insula and frontoparietal junction region on the right. No evidence of hemorrhage or swelling. No other acute brain insult. Background pattern of mild age related volume loss and minimal small vessel change of the white matter. No mass lesion, hydrocephalus or extra-axial collection. Vascular: Major vessels at the base of the brain show flow. Skull and upper cervical spine: Negative Sinuses/Orbits: Clear/ normal Other: None MRA HEAD FINDINGS Right internal carotid artery shows signal loss in the petrous segment which could represent true stenosis or artifact. Beyond that, the vessel is patent to the carotid siphon. There is moderate stenosis in the carotid siphon region. The anterior cerebral arteries both receive there supply from the right carotid an appear widely patent. The right M1 segment is widely patent. There is mild stenosis of the proximal M2 segment, estimated at 30-50%. There are a diminished number of M3 branches on the right, probably secondary to an occlusion serving the region of acute infarction. The left ICA is widely patent to the skullbase. There is mild stenosis in the siphon region. This vessel supplies only the left middle cerebral artery territory. More distal branch vessels show atherosclerotic narrowing and irregularity. The  left vertebral artery is dominant. Both vertebral arteries are patent to the basilar. There is mild atherosclerotic irregularity of the distal vertebral arteries with stenosis in the region of 30%. No basilar stenosis. Superior cerebellar and posterior cerebral arteries are patent. More distal branch vessels show atherosclerotic irregularity. IMPRESSION: Acute infarction on the right affecting the deep insula and frontoparietal junction region, about 5 cm in size. Consistent with right M3 branch vessel occlusion. No other acute infarction. No mass effect or hemorrhage. MR angiography shows signal loss of the right ICA in the petrous portion, which could represent true stenosis or artifactual signal loss. Consider CT angiography for evaluation. Intracranial distal vessel atherosclerotic narrowing and irregularity. Bilateral carotid siphon narrowing, mild to moderate. Stenosis of the proximal M2 vessel on the right, estimated at 30-50%. Probable missing M3 branch in the region of infarction. Electronically Signed   By: Nelson Chimes M.D.   On: 10/30/2017 14:13   US Carotid Bilateral (at [facility] Only)  Result Date: 10/30/2017 CLINICAL DATA:  Stroke EXAM: BILATERAL CAROTID DUPLEX ULTRASOUND TECHNIQUE: Pearline Cables scale imaging, color Doppler and duplex ultrasound were performed of bilateral carotid and vertebral arteries in the neck. COMPARISON:  None. FINDINGS: Criteria: Quantification of carotid stenosis is based on velocity parameters that correlate the residual internal carotid diameter with NASCET-based stenosis levels, using the diameter of the distal internal carotid lumen as the denominator for stenosis measurement. The following velocity measurements were obtained: RIGHT ICA:  75 cm/sec CCA:  84 cm/sec SYSTOLIC ICA/CCA RATIO:  0.9 DIASTOLIC ICA/CCA RATIO:  1.9 ECA:  93 cm/sec LEFT ICA:  67 cm/sec CCA:  82 cm/sec SYSTOLIC ICA/CCA RATIO:  0.8 DIASTOLIC ICA/CCA RATIO:  0.9 ECA:  142 cm/sec RIGHT CAROTID ARTERY:  Mild calcified plaque in the bulb. Low resistance internal carotid Doppler pattern. RIGHT VERTEBRAL ARTERY:  Antegrade. LEFT CAROTID ARTERY: There is moderate irregular calcified plaque in the bulb. Low resistance internal carotid Doppler pattern is preserved. There is prominent calcified plaque at the origin of the external carotid artery. LEFT VERTEBRAL ARTERY:  Antegrade. Additional findings: Examination was limited due to patient breathing  and motion. IMPRESSION: Less than 50% stenosis in the right and left internal carotid arteries. Electronically Signed   By: Marybelle Killings M.D.   On: 10/30/2017 13:49   Mr Jodene Nam Head/brain ZO Cm  Result Date: 10/30/2017 CLINICAL DATA:  Episode of left hand numbness in weakness with some slurred speech. EXAM: MRI HEAD WITHOUT CONTRAST MRA HEAD WITHOUT CONTRAST TECHNIQUE: Multiplanar, multiecho pulse sequences of the brain and surrounding structures were obtained without intravenous contrast. Angiographic images of the head were obtained using MRA technique without contrast. COMPARISON:  06/07/2013 MRI.  Head CT today. FINDINGS: MRI HEAD FINDINGS Brain: There is a 5 cm region of acute infarction affecting the deep insula and frontoparietal junction region on the right. No evidence of hemorrhage or swelling. No other acute brain insult. Background pattern of mild age related volume loss and minimal small vessel change of the white matter. No mass lesion, hydrocephalus or extra-axial collection. Vascular: Major vessels at the base of the brain show flow. Skull and upper cervical spine: Negative Sinuses/Orbits: Clear/ normal Other: None MRA HEAD FINDINGS Right internal carotid artery shows signal loss in the petrous segment which could represent true stenosis or artifact. Beyond that, the vessel is patent to the carotid siphon. There is moderate stenosis in the carotid siphon region. The anterior cerebral arteries both receive there supply from the right carotid an appear widely  patent. The right M1 segment is widely patent. There is mild stenosis of the proximal M2 segment, estimated at 30-50%. There are a diminished number of M3 branches on the right, probably secondary to an occlusion serving the region of acute infarction. The left ICA is widely patent to the skullbase. There is mild stenosis in the siphon region. This vessel supplies only the left middle cerebral artery territory. More distal branch vessels show atherosclerotic narrowing and irregularity. The left vertebral artery is dominant. Both vertebral arteries are patent to the basilar. There is mild atherosclerotic irregularity of the distal vertebral arteries with stenosis in the region of 30%. No basilar stenosis. Superior cerebellar and posterior cerebral arteries are patent. More distal branch vessels show atherosclerotic irregularity. IMPRESSION: Acute infarction on the right affecting the deep insula and frontoparietal junction region, about 5 cm in size. Consistent with right M3 branch vessel occlusion. No other acute infarction. No mass effect or hemorrhage. MR angiography shows signal loss of the right ICA in the petrous portion, which could represent true stenosis or artifactual signal loss. Consider CT angiography for evaluation. Intracranial distal vessel atherosclerotic narrowing and irregularity. Bilateral carotid siphon narrowing, mild to moderate. Stenosis of the proximal M2 vessel on the right, estimated at 30-50%. Probable missing M3 branch in the region of infarction. Electronically Signed   By: Nelson Chimes M.D.   On: 10/30/2017 14:13      Current Discharge Medication List    START taking these medications   Details  apixaban (ELIQUIS) 5 MG TABS tablet Take 1 tablet (5 mg total) 2 (two) times daily by mouth. Start on 11/20 affter stopping PLAVIX and take this with baby aspirin Qty: 60 tablet, Refills: 0    aspirin (ASPIRIN CHILDRENS) 81 MG chewable tablet Chew 1 tablet (81 mg total) daily by  mouth. Qty: 120 tablet, Refills: 0    clopidogrel (PLAVIX) 75 MG tablet Take 1 tablet (75 mg total) daily for 5 days by mouth. Take with baby aspirin 81 mg daily for 5 days (Stop Plavix on 11/19) then take Eliquis (start on 11/20) and baby ASA and stop  plavix.  DO NOT TAKE ASPIRIN, PLAVIX AND ELIQUIS TOGETHER Qty: 5 tablet, Refills: 0      CONTINUE these medications which have NOT CHANGED   Details  atorvastatin (LIPITOR) 80 MG tablet TAKE 1 TABLET BY MOUTH DAILY Qty: 90 tablet, Refills: 4    azelastine (OPTIVAR) 0.05 % ophthalmic solution Apply 1 drop to eye 2 (two) times daily. Qty: 6 mL, Refills: 5    benazepril-hydrochlorthiazide (LOTENSIN HCT) 20-12.5 MG tablet Take 0.5 tablets by mouth daily. Qty: 3 tablet, Refills: 0    beta carotene w/minerals (OCUVITE) tablet Take 1 tablet by mouth daily.    docusate sodium (COLACE) 100 MG capsule Take 1 capsule (100 mg total) by mouth 2 (two) times daily. Qty: 60 capsule, Refills: 0    fluticasone (FLONASE) 50 MCG/ACT nasal spray Place 2 sprays into both nostrils daily. Qty: 48 g, Refills: 5    HYDROcodone-acetaminophen (NORCO) 10-325 MG tablet Take 1 tablet by mouth every 6 (six) hours as needed. Qty: 30 tablet, Refills: 0   Associated Diagnoses: Neck pain    lansoprazole (PREVACID) 30 MG capsule TAKE 1 CAPSULE BY MOUTH EVERY DAY Qty: 30 capsule, Refills: 12    LORazepam (ATIVAN) 1 MG tablet TAKE 1 TABLET AT BEDTIME AS NEEDED FOR Qty: 30 tablet, Refills: 5    Magnesium 400 MG CAPS Take 1 capsule by mouth daily.     metoprolol (LOPRESSOR) 50 MG tablet Take 1 tablet (50 mg total) by mouth 2 (two) times daily. Qty: 180 tablet, Refills: 4    montelukast (SINGULAIR) 10 MG tablet TAKE ONE TABLET BY MOUTH EVERY DAY Qty: 30 tablet, Refills: 12    MULTIPLE VITAMIN PO Take 1 tablet by mouth daily.     Omega-3 Fatty Acids (FISH OIL) 1000 MG CAPS Take 1 capsule daily by mouth.    PROAIR HFA 108 (90 Base) MCG/ACT inhaler INHALE 2 PUFFS  INTO LUNGS EVERY 6 HOURS AS NEEDED FOR WHEEZING OR SHORTNESS OF BREATH Qty: 8.5 g, Refills: 5    SPIRIVA HANDIHALER 18 MCG inhalation capsule INHALE 1 CAPSULE AS DIRECTED ONCE A DAY Qty: 30 capsule, Refills: 12   Associated Diagnoses: Shortness of breath    SYMBICORT 160-4.5 MCG/ACT inhaler INHALE 2 PUFFS INTO THE LUNGS TWICE DAILY Qty: 10.2 g, Refills: 12    tamsulosin (FLOMAX) 0.4 MG CAPS capsule Take 1 capsule (0.4 mg total) by mouth daily. Qty: 90 capsule, Refills: 4    tiZANidine (ZANAFLEX) 4 MG tablet TAKE ONE TABLET AT BEDTIME Qty: 30 tablet, Refills: 12   Associated Diagnoses: Cervical neck pain with evidence of disc disease      STOP taking these medications     furosemide (LASIX) 20 MG tablet      chlorpheniramine-HYDROcodone (TUSSIONEX PENNKINETIC ER) 10-8 MG/5ML SUER      levofloxacin (LEVAQUIN) 500 MG tablet           Management plans discussed with the patient and he is in agreement. Stable for discharge home  Patient should follow up with cardiology and PCP  CODE STATUS:     Code Status Orders  (From admission, onward)        Start     Ordered   10/30/17 1135  Full code  Continuous     10/30/17 1134    Code Status History    Date Active Date Inactive Code Status Order ID Comments User Context   03/13/2017 18:20 03/14/2017 20:51 Full Code [id number]  Newman Pies, MD Inpatient   03/07/2017 18:49 03/08/2017  16:42 Full Code [id number]  Newman Pies, MD Inpatient    Advance Directive Documentation     Most Recent Value  Type of Advance Directive  Healthcare Power of Attorney, Living will  Pre-existing out of facility DNR order (yellow form or pink MOST form)  No data  "MOST" Form in Place?  No data      TOTAL TIME TAKING CARE OF THIS PATIENT: 38 minutes.    Note: This dictation was prepared with Dragon dictation along with smaller phrase technology. Any transcriptional errors that result from this process are unintentional.  Dequavius Kuhner  M.D on 10/31/2017 at 10:48 AM  Between 7am to 6pm - Pager - [phone] After 6pm go to [email or web address] - password [email or web address] [facility]  Office  [phone]  CC: Primary care physician; Birdie Sons, MD

## 2017-10-31 NOTE — Telephone Encounter (Signed)
Pt is being discharged today for acute CVA/stroke.  I have scheduled a hospital follow up appointment/MW

## 2017-10-31 NOTE — Progress Notes (Signed)
SLP Cancellation Note  Patient Details Name: James [name] MRN: [id number] DOB: 02/10/1929   Cancelled treatment:       Reason Eval/Treat Not Completed: SLP screened, no needs identified, will sign off(chart reviewed; consulted NSG then met w/ pt). Pt denied any consistent difficulty swallowing and is currently on a regular diet; tolerates swallowing pills w/ water per NSG. Pt stated he would f/u w/ his PCP if he felt he had difficulty w/ his swallowing. Pt conversed at conversational level w/out deficits noted; pt felt his speech was "my own" and that my Wife would have said if there was any problem still w/ it". He denied any language difficulty, confusion w/ communication w/ others. No further skilled ST services indicated as pt appears at his baseline. Pt agreed. NSG to reconsult if any change in status.     Orinda Kenner, MS, CCC-SLP Watson,Katherine 10/31/2017, 11:14 AM

## 2017-10-31 NOTE — Consult Note (Addendum)
Referring Physician: Mody    Chief Complaint: Left sided numbness and weakness  HPI: James [name] is an 83 y.o. male with a history of PAF on ASA who reports that he was eating yesterday morning and noted that he was unable to hold food in his left hand.  His wife noted that his left hand was hanging at his left side and she felt that he had poor use of that hand.  Wife also noted slurred speech but felt that was present on awakening.  With no improvement in symptoms patient presented for further evaluation.  Initial NIHSS of 1.    Date last known well: Date: 10/28/2017 Time last known well: Time: 22:00 tPA Given: No: Outside time window  Past Medical History:  Diagnosis Date  . Arthritis   . Back pain   . CAD (coronary artery disease)   . Cataract    right  . COPD (chronic obstructive pulmonary disease) ([place])    SPiriva and SYmbicort daily. Albuterol as needed  . Diverticulosis   . Dyspnea    with exertion  . Enlarged prostate    takes Flomax daily  . GERD (gastroesophageal reflux disease)   . History of chicken pox   . History of gout   . History of measles   . History of mumps   . HOH (hard of hearing)   . Hyperglycemia   . Hyperlipidemia    takes Atorvastatin daily  . Hypertension    takes Metoprolol daily as well as Lotensin HCT  . Hypotension   . Joint pain   . Microscopic colitis   . PAF (paroxysmal atrial fibrillation) ([place]), RVR 03/14/2017  . Prostate cancer ([place])   . TIA (transient ischemic attack)   . Weakness    numbness and tingling.mainly on right    Past Surgical History:  Procedure Laterality Date  . APPENDECTOMY  1950  . CARDIAC CATHETERIZATION  05/29/2013   EF=40-45%. Moderate pulmonary hypertension. Infero/ lateral hypokinesis  . cataract surgery Left   . COLONOSCOPY    . CORONARY ARTERY BYPASS GRAFT  2011   x 5  . MRI BRAIN  06/07/2013   Mild chronic involutional changes. No acute abnormalities  . MRI of neck    . Myocardial Perfusion Scan   05/29/2013   Abnormal myocardial perfusion image consistent with myocardial infarction  . PROSTATE SURGERY  11/18/2012   radiation seed  . TOTAL HIP ARTHROPLASTY Left 1997   hip joint replacement    Family History  Problem Relation Age of Onset  . Heart attack Mother   . Stroke Father   . Heart attack Father   . Hypertension Father   . Heart attack Sister   . Bladder Cancer Brother   . Kidney cancer Brother   . Heart Problems Brother    Social History:  reports that he has quit smoking. His smoking use included cigarettes. He has a 11.00 pack-year smoking history. he has never used smokeless tobacco. He reports that he drinks alcohol. He reports that he does not use drugs.  Allergies:  Allergies  Allergen Reactions  . Indomethacin Nausea Only    Medications:  I have reviewed the patient's current medications. Prior to Admission:  Medications Prior to Admission  Medication Sig Dispense Refill Last Dose  . atorvastatin (LIPITOR) 80 MG tablet TAKE 1 TABLET BY MOUTH DAILY 90 tablet 4 10/29/2017 at 2000  . azelastine (OPTIVAR) 0.05 % ophthalmic solution Apply 1 drop to eye 2 (two) times daily. 6  mL 5 10/29/2017 at 2000  . benazepril-hydrochlorthiazide (LOTENSIN HCT) 20-12.5 MG tablet Take 0.5 tablets by mouth daily. 3 tablet 0 10/30/2017 at 0800  . beta carotene w/minerals (OCUVITE) tablet Take 1 tablet by mouth daily.   10/30/2017 at 0800  . docusate sodium (COLACE) 100 MG capsule Take 1 capsule (100 mg total) by mouth 2 (two) times daily. (Patient taking differently: Take 100 mg 2 (two) times daily as needed by mouth for mild constipation. ) 60 capsule 0 PRN at PRN  . fluticasone (FLONASE) 50 MCG/ACT nasal spray Place 2 sprays into both nostrils daily. (Patient taking differently: Place 2 sprays into both nostrils every evening. ) 48 g 5 10/29/2017 at 2000  . furosemide (LASIX) 20 MG tablet Take 1 tablet (20 mg total) by mouth daily. 30 tablet 0 10/30/2017 at 0800  .  HYDROcodone-acetaminophen (NORCO) 10-325 MG tablet Take 1 tablet by mouth every 6 (six) hours as needed. 30 tablet 0 PRN at PRN  . lansoprazole (PREVACID) 30 MG capsule TAKE 1 CAPSULE BY MOUTH EVERY DAY 30 capsule 12 10/30/2017 at 0800  . LORazepam (ATIVAN) 1 MG tablet TAKE 1 TABLET AT BEDTIME AS NEEDED FOR 30 tablet 5 10/29/2017 at 2000  . Magnesium 400 MG CAPS Take 1 capsule by mouth daily.    10/30/2017 at 0800  . metoprolol (LOPRESSOR) 50 MG tablet Take 1 tablet (50 mg total) by mouth 2 (two) times daily. 180 tablet 4 10/30/2017 at 0800  . montelukast (SINGULAIR) 10 MG tablet TAKE ONE TABLET BY MOUTH EVERY DAY 30 tablet 12 10/29/2017 at 2000  . MULTIPLE VITAMIN PO Take 1 tablet by mouth daily.    10/30/2017 at 0800  . Omega-3 Fatty Acids (FISH OIL) 1000 MG CAPS Take 1 capsule daily by mouth.   10/30/2017 at 0800  . PROAIR HFA 108 (90 Base) MCG/ACT inhaler INHALE 2 PUFFS INTO LUNGS EVERY 6 HOURS AS NEEDED FOR WHEEZING OR SHORTNESS OF BREATH 8.5 g 5 PRN at PRN  . SPIRIVA HANDIHALER 18 MCG inhalation capsule INHALE 1 CAPSULE AS DIRECTED ONCE A DAY 30 capsule 12 10/30/2017 at 0800  . SYMBICORT 160-4.5 MCG/ACT inhaler INHALE 2 PUFFS INTO THE LUNGS TWICE DAILY 10.2 g 12 10/30/2017 at 0800  . tamsulosin (FLOMAX) 0.4 MG CAPS capsule Take 1 capsule (0.4 mg total) by mouth daily. 90 capsule 4 10/30/2017 at 0800  . tiZANidine (ZANAFLEX) 4 MG tablet TAKE ONE TABLET AT BEDTIME (Patient taking differently: TAKE ONE TABLET AT BEDTIME AS NEEDED FOR SPASMS) 30 tablet 12 PRN at PRN  . chlorpheniramine-HYDROcodone (TUSSIONEX PENNKINETIC ER) 10-8 MG/5ML SUER Take 5 mLs by mouth every 12 (twelve) hours as needed for cough. (Patient not taking: Reported on 10/19/2017) 240 mL 0 Not Taking at Unknown time  . levofloxacin (LEVAQUIN) 500 MG tablet Take 1 tablet (500 mg total) by mouth daily. (Patient not taking: Reported on 10/16/2017) 7 tablet 0 Completed Course at Unknown time   Scheduled: . aspirin  300 mg Rectal Daily    Or  . aspirin EC  325 mg Oral Daily  . atorvastatin  80 mg Oral Daily  . benazepril  10 mg Oral Daily   And  . hydrochlorothiazide  6.25 mg Oral Daily  . benazepril  20 mg Oral Daily   And  . hydrochlorothiazide  12.5 mg Oral Daily  . clopidogrel  75 mg Oral Daily  . docusate sodium  100 mg Oral BID  . enoxaparin (LOVENOX) injection  40 mg Subcutaneous Q24H  . fluticasone  2 spray Each Nare QPM  . furosemide  20 mg Oral Daily  . magnesium oxide  400 mg Oral Daily  . metoprolol tartrate  50 mg Oral BID  . mometasone-formoterol  2 puff Inhalation BID  . montelukast  10 mg Oral Daily  . pantoprazole sodium  20 mg Oral Daily  . tamsulosin  0.4 mg Oral Daily  . tiotropium  18 mcg Inhalation Daily    ROS: History obtained from the patient  General ROS: negative for - chills, fatigue, fever, night sweats, weight gain or weight loss Psychological ROS: negative for - behavioral disorder, hallucinations, memory difficulties, mood swings or suicidal ideation Ophthalmic ROS: negative for - blurry vision, double vision, eye pain or loss of vision ENT ROS: negative for - epistaxis, nasal discharge, oral lesions, sore throat, tinnitus or vertigo Allergy and Immunology ROS: negative for - hives or itchy/watery eyes Hematological and Lymphatic ROS: negative for - bleeding problems, bruising or swollen lymph nodes Endocrine ROS: negative for - galactorrhea, hair pattern changes, polydipsia/polyuria or temperature intolerance Respiratory ROS: negative for - cough, hemoptysis, shortness of breath or wheezing Cardiovascular ROS: negative for - chest pain, dyspnea on exertion, edema or irregular heartbeat Gastrointestinal ROS: negative for - abdominal pain, diarrhea, hematemesis, nausea/vomiting or stool incontinence Genito-Urinary ROS: negative for - dysuria, hematuria, incontinence or urinary frequency/urgency Musculoskeletal ROS: negative for - joint swelling or muscular weakness Neurological  ROS: as noted in HPI Dermatological ROS: negative for rash and skin lesion changes  Physical Examination: Blood pressure (!) 153/67, pulse 81, temperature 98.4 F (36.9 C), temperature source Oral, resp. rate 18, height 5\' 8"  (1.727 m), weight 85.4 kg (188 lb 5 oz), SpO2 95 %.  HEENT-  Normocephalic, no lesions, without obvious abnormality.  Normal external eye and conjunctiva.  Normal TM's bilaterally.  Normal auditory canals and external ears. Normal external nose, mucus membranes and septum.  Normal pharynx. Cardiovascular- S1, S2 normal, pulses palpable throughout   Lungs- chest clear, no wheezing, rales, normal symmetric air entry Abdomen- soft, non-tender; bowel sounds normal; no masses,  no organomegaly Extremities- no edema Lymph-no adenopathy palpable Musculoskeletal-no joint tenderness, deformity or swelling Skin-warm and dry, no hyperpigmentation, vitiligo, or suspicious lesions  Neurological Examination   Mental Status: Alert, oriented, thought content appropriate.  Speech fluent without evidence of aphasia.  Able to follow 3 step commands without difficulty. Cranial Nerves: II: Discs flat bilaterally; Visual fields grossly normal, pupils equal, round, reactive to light and accommodation III,IV, VI: ptosis not present, extra-ocular motions intact bilaterally V,VII: smile symmetric, facial light touch sensation normal bilaterally VIII: hearing normal bilaterally IX,X: gag reflex present XI: bilateral shoulder shrug XII: midline tongue extension Motor: Right : Upper extremity   5/5    Left:     Upper extremity   5-/5  Lower extremity   5/5     Lower extremity   5/5 Tone and bulk:normal tone throughout; no atrophy noted Sensory: Pinprick and light touch intact throughout, bilaterally Deep Tendon Reflexes: 2+ and symmetric with absent AJ's bilaterally Plantars: Right: downgoing   Left: downgoing Cerebellar: Mild dysmetria with finger-to-nose testing on the left. Intact  otherwise. Gait: not tested due to safety concerns    Laboratory Studies:  Basic Metabolic Panel: Recent Labs  Lab 10/30/17 0835  NA 132*  K 4.5  CL 95*  CO2 27  GLUCOSE 103*  BUN 20  CREATININE 1.15  CALCIUM 8.9    Liver Function Tests: Recent Labs  Lab 10/30/17 0835  AST 30  ALT 25  ALKPHOS 81  BILITOT 1.2  PROT 6.5  ALBUMIN 3.6   No results for input(s): LIPASE, AMYLASE in the last 168 hours. No results for input(s): AMMONIA in the last 168 hours.  CBC: Recent Labs  Lab 10/30/17 0835  WBC 8.0  NEUTROABS 5.8  HGB 13.2  HCT 41.9  MCV 86.6  PLT 187    Cardiac Enzymes: Recent Labs  Lab 10/30/17 0835  TROPONINI <0.03    BNP: Invalid input(s): POCBNP  CBG: Recent Labs  Lab 10/30/17 5009  FGHWEX 93    Microbiology: Results for orders placed or performed during the hospital encounter of 02/28/17  Surgical pcr screen     Status: None   Collection Time: 02/28/17 10:26 AM  Result Value Ref Range Status   MRSA, PCR NEGATIVE NEGATIVE Final   Staphylococcus aureus NEGATIVE NEGATIVE Final    Comment:        The Xpert SA Assay (FDA approved for NASAL specimens in patients over 38 years of age), is one component of a comprehensive surveillance program.  Test performance has been validated by [facility] for patients greater than or equal to 35 year old. It is not intended to diagnose infection nor to guide or monitor treatment.     Coagulation Studies: Recent Labs    10/30/17 0835  LABPROT 12.2  INR 0.91    Urinalysis: No results for input(s): COLORURINE, LABSPEC, PHURINE, GLUCOSEU, HGBUR, BILIRUBINUR, KETONESUR, PROTEINUR, UROBILINOGEN, NITRITE, LEUKOCYTESUR in the last 168 hours.  Invalid input(s): APPERANCEUR  Lipid Panel:    Component Value Date/Time   CHOL 106 10/31/2017 0657   TRIG 47 10/31/2017 0657   HDL 52 10/31/2017 0657   CHOLHDL 2.0 10/31/2017 0657   VLDL 9 10/31/2017 0657   LDLCALC 45 10/31/2017 0657    HgbA1C:   Lab Results  Component Value Date   HGBA1C 5.7 10/07/2014    Urine Drug Screen:  No results found for: LABOPIA, COCAINSCRNUR, LABBENZ, AMPHETMU, THCU, LABBARB  Alcohol Level: No results for input(s): ETH in the last 168 hours.   Imaging: Dg Chest 2 View  Result Date: 10/30/2017 CLINICAL DATA:  Weakness EXAM: CHEST  2 VIEW COMPARISON:  10/16/2017 FINDINGS: Cardiac shadow is stable. Aortic calcifications are again seen. Postsurgical changes are again noted. Elevation of the right hemidiaphragm is again seen with right basilar atelectasis. Previously seen alveolar changes on the right have resolved in the interval. No sizable effusion is noted. IMPRESSION: Chronic changes without acute abnormality. Electronically Signed   By: Inez Catalina M.D.   On: 10/30/2017 14:13   Ct Head Wo Contrast  Result Date: 10/30/2017 CLINICAL DATA:  Upper extremity weakness on the left EXAM: CT HEAD WITHOUT CONTRAST TECHNIQUE: Contiguous axial images were obtained from the base of the skull through the vertex without intravenous contrast. COMPARISON:  06/04/2013 FINDINGS: Brain: No evidence of acute infarction, hemorrhage, hydrocephalus, extra-axial collection or mass lesion/mass effect. Vascular: No hyperdense vessel or unexpected calcification. Skull: Normal. Negative for fracture or focal lesion. Sinuses/Orbits: No acute finding. Other: None. IMPRESSION: No acute intracranial abnormality noted. Electronically Signed   By: Inez Catalina M.D.   On: 10/30/2017 08:35   Mr Brain Wo Contrast  Result Date: 10/30/2017 CLINICAL DATA:  Episode of left hand numbness in weakness with some slurred speech. EXAM: MRI HEAD WITHOUT CONTRAST MRA HEAD WITHOUT CONTRAST TECHNIQUE: Multiplanar, multiecho pulse sequences of the brain and surrounding structures were obtained without intravenous contrast. Angiographic images of the head were obtained using MRA  technique without contrast. COMPARISON:  06/07/2013 MRI.  Head CT today.  FINDINGS: MRI HEAD FINDINGS Brain: There is a 5 cm region of acute infarction affecting the deep insula and frontoparietal junction region on the right. No evidence of hemorrhage or swelling. No other acute brain insult. Background pattern of mild age related volume loss and minimal small vessel change of the white matter. No mass lesion, hydrocephalus or extra-axial collection. Vascular: Major vessels at the base of the brain show flow. Skull and upper cervical spine: Negative Sinuses/Orbits: Clear/ normal Other: None MRA HEAD FINDINGS Right internal carotid artery shows signal loss in the petrous segment which could represent true stenosis or artifact. Beyond that, the vessel is patent to the carotid siphon. There is moderate stenosis in the carotid siphon region. The anterior cerebral arteries both receive there supply from the right carotid an appear widely patent. The right M1 segment is widely patent. There is mild stenosis of the proximal M2 segment, estimated at 30-50%. There are a diminished number of M3 branches on the right, probably secondary to an occlusion serving the region of acute infarction. The left ICA is widely patent to the skullbase. There is mild stenosis in the siphon region. This vessel supplies only the left middle cerebral artery territory. More distal branch vessels show atherosclerotic narrowing and irregularity. The left vertebral artery is dominant. Both vertebral arteries are patent to the basilar. There is mild atherosclerotic irregularity of the distal vertebral arteries with stenosis in the region of 30%. No basilar stenosis. Superior cerebellar and posterior cerebral arteries are patent. More distal branch vessels show atherosclerotic irregularity. IMPRESSION: Acute infarction on the right affecting the deep insula and frontoparietal junction region, about 5 cm in size. Consistent with right M3 branch vessel occlusion. No other acute infarction. No mass effect or hemorrhage. MR  angiography shows signal loss of the right ICA in the petrous portion, which could represent true stenosis or artifactual signal loss. Consider CT angiography for evaluation. Intracranial distal vessel atherosclerotic narrowing and irregularity. Bilateral carotid siphon narrowing, mild to moderate. Stenosis of the proximal M2 vessel on the right, estimated at 30-50%. Probable missing M3 branch in the region of infarction. Electronically Signed   By: Nelson Chimes M.D.   On: 10/30/2017 14:13   US Carotid Bilateral (at [facility] And [facility] Only)  Result Date: 10/30/2017 CLINICAL DATA:  Stroke EXAM: BILATERAL CAROTID DUPLEX ULTRASOUND TECHNIQUE: Pearline Cables scale imaging, color Doppler and duplex ultrasound were performed of bilateral carotid and vertebral arteries in the neck. COMPARISON:  None. FINDINGS: Criteria: Quantification of carotid stenosis is based on velocity parameters that correlate the residual internal carotid diameter with NASCET-based stenosis levels, using the diameter of the distal internal carotid lumen as the denominator for stenosis measurement. The following velocity measurements were obtained: RIGHT ICA:  75 cm/sec CCA:  84 cm/sec SYSTOLIC ICA/CCA RATIO:  0.9 DIASTOLIC ICA/CCA RATIO:  1.9 ECA:  93 cm/sec LEFT ICA:  67 cm/sec CCA:  82 cm/sec SYSTOLIC ICA/CCA RATIO:  0.8 DIASTOLIC ICA/CCA RATIO:  0.9 ECA:  142 cm/sec RIGHT CAROTID ARTERY: Mild calcified plaque in the bulb. Low resistance internal carotid Doppler pattern. RIGHT VERTEBRAL ARTERY:  Antegrade. LEFT CAROTID ARTERY: There is moderate irregular calcified plaque in the bulb. Low resistance internal carotid Doppler pattern is preserved. There is prominent calcified plaque at the origin of the external carotid artery. LEFT VERTEBRAL ARTERY:  Antegrade. Additional findings: Examination was limited due to patient breathing and motion. IMPRESSION: Less than 50% stenosis in the right  and left internal carotid arteries. Electronically Signed   By: Marybelle Killings M.D.   On: 10/30/2017 13:49   Mr Jodene Nam Head/brain EG Cm  Result Date: 10/30/2017 CLINICAL DATA:  Episode of left hand numbness in weakness with some slurred speech. EXAM: MRI HEAD WITHOUT CONTRAST MRA HEAD WITHOUT CONTRAST TECHNIQUE: Multiplanar, multiecho pulse sequences of the brain and surrounding structures were obtained without intravenous contrast. Angiographic images of the head were obtained using MRA technique without contrast. COMPARISON:  06/07/2013 MRI.  Head CT today. FINDINGS: MRI HEAD FINDINGS Brain: There is a 5 cm region of acute infarction affecting the deep insula and frontoparietal junction region on the right. No evidence of hemorrhage or swelling. No other acute brain insult. Background pattern of mild age related volume loss and minimal small vessel change of the white matter. No mass lesion, hydrocephalus or extra-axial collection. Vascular: Major vessels at the base of the brain show flow. Skull and upper cervical spine: Negative Sinuses/Orbits: Clear/ normal Other: None MRA HEAD FINDINGS Right internal carotid artery shows signal loss in the petrous segment which could represent true stenosis or artifact. Beyond that, the vessel is patent to the carotid siphon. There is moderate stenosis in the carotid siphon region. The anterior cerebral arteries both receive there supply from the right carotid an appear widely patent. The right M1 segment is widely patent. There is mild stenosis of the proximal M2 segment, estimated at 30-50%. There are a diminished number of M3 branches on the right, probably secondary to an occlusion serving the region of acute infarction. The left ICA is widely patent to the skullbase. There is mild stenosis in the siphon region. This vessel supplies only the left middle cerebral artery territory. More distal branch vessels show atherosclerotic narrowing and irregularity. The left vertebral artery is dominant. Both vertebral arteries are patent to the basilar.  There is mild atherosclerotic irregularity of the distal vertebral arteries with stenosis in the region of 30%. No basilar stenosis. Superior cerebellar and posterior cerebral arteries are patent. More distal branch vessels show atherosclerotic irregularity. IMPRESSION: Acute infarction on the right affecting the deep insula and frontoparietal junction region, about 5 cm in size. Consistent with right M3 branch vessel occlusion. No other acute infarction. No mass effect or hemorrhage. MR angiography shows signal loss of the right ICA in the petrous portion, which could represent true stenosis or artifactual signal loss. Consider CT angiography for evaluation. Intracranial distal vessel atherosclerotic narrowing and irregularity. Bilateral carotid siphon narrowing, mild to moderate. Stenosis of the proximal M2 vessel on the right, estimated at 30-50%. Probable missing M3 branch in the region of infarction. Electronically Signed   By: Nelson Chimes M.D.   On: 10/30/2017 14:13    Assessment: 83 y.o. male presenting with slurred speech and left sided weakness/numbness.  Symptoms resolved.  MRI of the brain reviewed and shows an acute infarct in the right frontoparietal region.  Likely embolic in etiology.  Patient with a history of PAF.  On ASA prior to admission.  MRA Shows right M3 occlusion.  Carotid dopplers show no evidence of hemodynamically significant stenosis.  Echocardiogram pending.  A1c pending, LDL 45.  On a statin.    Stroke Risk Factors - atrial fibrillation and hyperlipidemia  Plan: 1. HgbA1c pending 2. PT consult, OT consult, Speech consult 3. Echocardiogram 4. Prophylactic therapy-ASA 81mg  and Plavix 75mg  daily now.  Would change to Eliquis after 5-7 days and continue Eliquis long term. 5. Telemetry monitoring 6. Frequent neuro checks  7. Follow up with neurology and cardiology on an outpatient basis.     Case discussed with Dr. Fredirick Maudlin,  MD Neurology [phone] 10/31/2017, 10:22 AM

## 2017-10-31 NOTE — Care Management CC44 (Signed)
Condition Code 44 Documentation Completed  Patient Details  Name: James [name] MRN: [id number] Date of Birth: August 22, 1929   Condition Code 44 given:  Yes Patient signature on Condition Code 44 notice:  Yes Documentation of 2 MD's agreement:  Yes Code 44 added to claim:  Yes    Shelbie Ammons, RN 10/31/2017, 2:04 PM

## 2017-10-31 NOTE — Evaluation (Signed)
Occupational Therapy Evaluation Patient Details Name: James [name] MRN: [id number] DOB: December 04, 1929 Today's Date: 10/31/2017    History of Present Illness 83 y/o male who woke 11/13 with L UE numbness/coordination issues as well as slurred speech.  MRI reveals CVA.   Clinical Impression   Patient seen for OT evaluation and treatment this date.  Patient presents with mild incoordination in LUE, dropping items occasionally with left hand and difficulty with picking up small objects less than 1/2 inch in size.  He is able to perform basic self care tasks with modified independence but needs additional time to complete items such as tying shoes.  He was instructed on and issued a home exercise program for speed, dexterity and functional hand use/coordination and able to demonstrate exercises.  He has made significant progress since yesterday and do not anticipate further OT needs however if symptoms worsen in his left hand, he may benefit from OT as an outpatient.       Follow Up Recommendations  No OT follow up    Equipment Recommendations       Recommendations for Other Services       Precautions / Restrictions Precautions Precautions: Fall Restrictions Weight Bearing Restrictions: No      Mobility Bed Mobility Overal bed mobility: Independent                Transfers Overall transfer level: Independent Equipment used: None                  Balance Overall balance assessment: Independent                                         ADL either performed or assessed with clinical judgement   ADL Overall ADL's : At baseline                                             Vision Baseline Vision/History: Wears glasses Wears Glasses: At all times Patient Visual Report: No change from baseline       Perception     Praxis      Pertinent Vitals/Pain Pain Assessment: No/denies pain Pain Score: 0-No pain     Hand Dominance  Right   Extremity/Trunk Assessment Upper Extremity Assessment Upper Extremity Assessment: Generalized weakness;LUE deficits/detail LUE Deficits / Details: mild coordination impairment in left UE, dropping items occasionally, able to tie shoes after 3 tries LUE Coordination: decreased fine motor   Lower Extremity Assessment Lower Extremity Assessment: Defer to PT evaluation       Communication Communication Communication: No difficulties   Cognition Arousal/Alertness: Awake/alert Behavior During Therapy: WFL for tasks assessed/performed Overall Cognitive Status: Within Functional Limits for tasks assessed                                     General Comments    Patient issued and instructed on HEP for speed, dexterity and coordination of left UE. He is able to pick up coins from tabletop, occasionally dropping items.  He is able to demonstrate tying shoes after 3 attempts.     Exercises     Shoulder Instructions      Home Living Family/patient expects to be  discharged to:: Private residence Living Arrangements: Spouse/significant other Available Help at Discharge: Family Type of Home: House Home Access: Stairs to enter Technical brewer of Steps: 2 Entrance Stairs-Rails: Right Home Layout: One level     Bathroom Shower/Tub: Tub/shower unit;Door   [employer] Toilet: Standard     Home Equipment: None          Prior Functioning/Environment Level of Independence: Independent        Comments: Patient reports he was independent with light homemaking tasks, can also perform light meal prep        OT Problem List: Impaired UE functional use;Decreased coordination      OT Treatment/Interventions:      OT Goals(Current goals can be found in the care plan section) Acute Rehab OT Goals Patient Stated Goal: to get back home and do everything I did before OT Goal Formulation: With patient Time For Goal Achievement: 11/10/17 Potential to Achieve  Goals: Good  OT Frequency:     Barriers to D/C:            Co-evaluation              AM-PAC PT "6 Clicks" Daily Activity     Outcome Measure Help from another person eating meals?: None Help from another person taking care of personal grooming?: None Help from another person toileting, which includes using toliet, bedpan, or urinal?: None Help from another person bathing (including washing, rinsing, drying)?: None Help from another person to put on and taking off regular upper body clothing?: None Help from another person to put on and taking off regular lower body clothing?: A Little 6 Click Score: 23   End of Session Equipment Utilized During Treatment: Gait belt  Activity Tolerance: Patient tolerated treatment well Patient left: in chair;with call bell/phone within reach;with chair alarm set  OT Visit Diagnosis: Muscle weakness (generalized) (M62.81);Other (comment)(lack of coordination)                Time: 1130-1201 OT Time Calculation (min): 31 min Charges:  OT General Charges $OT Visit: 1 Visit OT Evaluation $OT Eval Low Complexity: 1 Low OT Treatments $Neuromuscular Re-education: 8-22 mins G-Codes: OT G-codes **NOT FOR INPATIENT CLASS** Functional Assessment Tool Used: AM-PAC 6 Clicks Daily Activity Functional Limitation: Self care Self Care Current Status (A0762): At least 1 percent but less than 20 percent impaired, limited or restricted Self Care Goal Status (U6333): 0 percent impaired, limited or restricted   Nhyira Leano T Joel Mericle, OTR/L, CLT   Brittian Renaldo 10/31/2017, 12:21 PM

## 2017-11-05 DIAGNOSIS — I5022 Chronic systolic (congestive) heart failure: Secondary | ICD-10-CM | POA: Diagnosis not present

## 2017-11-05 DIAGNOSIS — I2510 Atherosclerotic heart disease of native coronary artery without angina pectoris: Secondary | ICD-10-CM | POA: Diagnosis not present

## 2017-11-05 DIAGNOSIS — E7801 Familial hypercholesterolemia: Secondary | ICD-10-CM | POA: Diagnosis not present

## 2017-11-05 DIAGNOSIS — I480 Paroxysmal atrial fibrillation: Secondary | ICD-10-CM | POA: Diagnosis not present

## 2017-11-06 ENCOUNTER — Emergency Department: Payer: Medicare Other

## 2017-11-06 ENCOUNTER — Inpatient Hospital Stay
Admission: EM | Admit: 2017-11-06 | Discharge: 2017-11-08 | DRG: 194 | Disposition: A | Discharge status: Home / Self Care | Payer: Medicare Other | Attending: Internal Medicine | Admitting: Internal Medicine

## 2017-11-06 ENCOUNTER — Encounter: Payer: Self-pay | Admitting: Emergency Medicine

## 2017-11-06 ENCOUNTER — Other Ambulatory Visit: Payer: Self-pay

## 2017-11-06 DIAGNOSIS — I480 Paroxysmal atrial fibrillation: Secondary | ICD-10-CM | POA: Diagnosis present

## 2017-11-06 DIAGNOSIS — Z87891 Personal history of nicotine dependence: Secondary | ICD-10-CM | POA: Diagnosis not present

## 2017-11-06 DIAGNOSIS — I482 Chronic atrial fibrillation: Secondary | ICD-10-CM | POA: Diagnosis present

## 2017-11-06 DIAGNOSIS — Z96642 Presence of left artificial hip joint: Secondary | ICD-10-CM | POA: Diagnosis present

## 2017-11-06 DIAGNOSIS — M549 Dorsalgia, unspecified: Secondary | ICD-10-CM | POA: Diagnosis present

## 2017-11-06 DIAGNOSIS — Z8249 Family history of ischemic heart disease and other diseases of the circulatory system: Secondary | ICD-10-CM

## 2017-11-06 DIAGNOSIS — Z8546 Personal history of malignant neoplasm of prostate: Secondary | ICD-10-CM | POA: Diagnosis not present

## 2017-11-06 DIAGNOSIS — R042 Hemoptysis: Secondary | ICD-10-CM | POA: Diagnosis present

## 2017-11-06 DIAGNOSIS — I5022 Chronic systolic (congestive) heart failure: Secondary | ICD-10-CM | POA: Diagnosis present

## 2017-11-06 DIAGNOSIS — Y95 Nosocomial condition: Secondary | ICD-10-CM | POA: Diagnosis present

## 2017-11-06 DIAGNOSIS — N400 Enlarged prostate without lower urinary tract symptoms: Secondary | ICD-10-CM | POA: Diagnosis present

## 2017-11-06 DIAGNOSIS — J189 Pneumonia, unspecified organism: Principal | ICD-10-CM | POA: Diagnosis present

## 2017-11-06 DIAGNOSIS — Z951 Presence of aortocoronary bypass graft: Secondary | ICD-10-CM | POA: Diagnosis not present

## 2017-11-06 DIAGNOSIS — Z8673 Personal history of transient ischemic attack (TIA), and cerebral infarction without residual deficits: Secondary | ICD-10-CM

## 2017-11-06 DIAGNOSIS — R918 Other nonspecific abnormal finding of lung field: Secondary | ICD-10-CM | POA: Diagnosis not present

## 2017-11-06 DIAGNOSIS — Z8052 Family history of malignant neoplasm of bladder: Secondary | ICD-10-CM

## 2017-11-06 DIAGNOSIS — H9190 Unspecified hearing loss, unspecified ear: Secondary | ICD-10-CM | POA: Diagnosis present

## 2017-11-06 DIAGNOSIS — Z7901 Long term (current) use of anticoagulants: Secondary | ICD-10-CM | POA: Diagnosis not present

## 2017-11-06 DIAGNOSIS — M1990 Unspecified osteoarthritis, unspecified site: Secondary | ICD-10-CM | POA: Diagnosis not present

## 2017-11-06 DIAGNOSIS — E871 Hypo-osmolality and hyponatremia: Secondary | ICD-10-CM

## 2017-11-06 DIAGNOSIS — R0602 Shortness of breath: Secondary | ICD-10-CM | POA: Diagnosis not present

## 2017-11-06 DIAGNOSIS — I2510 Atherosclerotic heart disease of native coronary artery without angina pectoris: Secondary | ICD-10-CM | POA: Diagnosis present

## 2017-11-06 DIAGNOSIS — J440 Chronic obstructive pulmonary disease with acute lower respiratory infection: Secondary | ICD-10-CM | POA: Diagnosis present

## 2017-11-06 DIAGNOSIS — Z823 Family history of stroke: Secondary | ICD-10-CM

## 2017-11-06 DIAGNOSIS — M109 Gout, unspecified: Secondary | ICD-10-CM | POA: Diagnosis present

## 2017-11-06 DIAGNOSIS — K5790 Diverticulosis of intestine, part unspecified, without perforation or abscess without bleeding: Secondary | ICD-10-CM | POA: Diagnosis present

## 2017-11-06 DIAGNOSIS — I110 Hypertensive heart disease with heart failure: Secondary | ICD-10-CM | POA: Diagnosis present

## 2017-11-06 DIAGNOSIS — Z7982 Long term (current) use of aspirin: Secondary | ICD-10-CM

## 2017-11-06 DIAGNOSIS — Z79891 Long term (current) use of opiate analgesic: Secondary | ICD-10-CM

## 2017-11-06 DIAGNOSIS — Z981 Arthrodesis status: Secondary | ICD-10-CM

## 2017-11-06 DIAGNOSIS — E785 Hyperlipidemia, unspecified: Secondary | ICD-10-CM | POA: Diagnosis not present

## 2017-11-06 DIAGNOSIS — R05 Cough: Secondary | ICD-10-CM | POA: Diagnosis not present

## 2017-11-06 DIAGNOSIS — K219 Gastro-esophageal reflux disease without esophagitis: Secondary | ICD-10-CM | POA: Diagnosis present

## 2017-11-06 DIAGNOSIS — Z8051 Family history of malignant neoplasm of kidney: Secondary | ICD-10-CM

## 2017-11-06 DIAGNOSIS — Z7951 Long term (current) use of inhaled steroids: Secondary | ICD-10-CM

## 2017-11-06 DIAGNOSIS — E860 Dehydration: Secondary | ICD-10-CM | POA: Diagnosis present

## 2017-11-06 DIAGNOSIS — J449 Chronic obstructive pulmonary disease, unspecified: Secondary | ICD-10-CM | POA: Diagnosis not present

## 2017-11-06 DIAGNOSIS — H269 Unspecified cataract: Secondary | ICD-10-CM | POA: Diagnosis present

## 2017-11-06 HISTORY — DX: Pneumonia, unspecified organism: J18.9

## 2017-11-06 LAB — CBC
HCT: 44.3 % (ref 40.0–52.0)
Hemoglobin: 14.6 g/dL (ref 13.0–18.0)
MCH: 28.5 pg (ref 26.0–34.0)
MCHC: 33 g/dL (ref 32.0–36.0)
MCV: 86.4 fL (ref 80.0–100.0)
Platelets: 168 10*3/uL (ref 150–440)
RBC: 5.13 MIL/uL (ref 4.40–5.90)
RDW: 15.4 % — ABNORMAL HIGH (ref 11.5–14.5)
WBC: 9.9 10*3/uL (ref 3.8–10.6)

## 2017-11-06 LAB — BASIC METABOLIC PANEL
Anion gap: 11 (ref 5–15)
BUN: 16 mg/dL (ref 6–20)
CO2: 22 mmol/L (ref 22–32)
Calcium: 8.9 mg/dL (ref 8.9–10.3)
Chloride: 94 mmol/L — ABNORMAL LOW (ref 101–111)
Creatinine, Ser: 1 mg/dL (ref 0.61–1.24)
GFR calc Af Amer: >60 mL/min (ref >60)
GFR calc non Af Amer: >60 mL/min (ref >60)
Glucose, Bld: 120 mg/dL — ABNORMAL HIGH (ref 65–99)
Potassium: 4.5 mmol/L (ref 3.5–5.1)
Sodium: 127 mmol/L — ABNORMAL LOW (ref 135–145)

## 2017-11-06 LAB — HEPATIC FUNCTION PANEL
ALT: 28 U/L (ref 17–63)
AST: 30 U/L (ref 15–41)
Albumin: 3.9 g/dL (ref 3.5–5.0)
Alkaline Phosphatase: 80 U/L (ref 38–126)
Bilirubin, Direct: 0.2 mg/dL (ref 0.1–0.5)
Indirect Bilirubin: 0.7 mg/dL (ref 0.3–0.9)
Total Bilirubin: 0.9 mg/dL (ref 0.3–1.2)
Total Protein: 6.8 g/dL (ref 6.5–8.1)

## 2017-11-06 LAB — TROPONIN I: Troponin I: <0.03 ng/mL (ref <0.03)

## 2017-11-06 LAB — MRSA PCR SCREENING: MRSA by PCR: NEGATIVE

## 2017-11-06 MED ORDER — DOCUSATE SODIUM 100 MG PO CAPS: 100.0000 mg | ORAL_CAPSULE | Freq: Two times a day (BID) | ORAL | Status: DC | PRN

## 2017-11-06 MED ORDER — MONTELUKAST SODIUM 10 MG PO TABS
10.0000 mg | ORAL_TABLET | Freq: Every day | ORAL | Status: DC
  Administered 2017-11-06 – 2017-11-08 (×3): 10 mg via ORAL
  Filled 2017-11-06 (×3): qty 1

## 2017-11-06 MED ORDER — FLUTICASONE PROPIONATE 50 MCG/ACT NA SUSP
2.0000 | Freq: Every evening | NASAL | Status: DC
  Administered 2017-11-06: 21:00:00 2 via NASAL
  Filled 2017-11-06: qty 16

## 2017-11-06 MED ORDER — PANTOPRAZOLE SODIUM 40 MG PO TBEC
40.0000 mg | DELAYED_RELEASE_TABLET | Freq: Every day | ORAL | Status: DC
  Administered 2017-11-07 – 2017-11-08 (×2): 40 mg via ORAL
  Filled 2017-11-06 (×2): qty 1

## 2017-11-06 MED ORDER — IPRATROPIUM-ALBUTEROL 0.5-2.5 (3) MG/3ML IN SOLN
3.0000 mL | Freq: Once | RESPIRATORY_TRACT | Status: AC
  Administered 2017-11-06: 3 mL via RESPIRATORY_TRACT

## 2017-11-06 MED ORDER — DEXTROSE 5 % IV SOLN
1.0000 g | Freq: Three times a day (TID) | INTRAVENOUS | Status: DC
  Administered 2017-11-06 – 2017-11-08 (×5): 1 g via INTRAVENOUS
  Filled 2017-11-06 (×8): qty 1

## 2017-11-06 MED ORDER — OCUVITE-LUTEIN PO TABS
1.0000 | ORAL_TABLET | Freq: Every day | ORAL | Status: DC
  Administered 2017-11-07 – 2017-11-08 (×2): 1 via ORAL
  Filled 2017-11-06 (×4): qty 1

## 2017-11-06 MED ORDER — TIOTROPIUM BROMIDE MONOHYDRATE 18 MCG IN CAPS
18.0000 ug | ORAL_CAPSULE | Freq: Every day | RESPIRATORY_TRACT | Status: DC
  Administered 2017-11-07 – 2017-11-08 (×2): 18 ug via RESPIRATORY_TRACT
  Filled 2017-11-06: qty 5

## 2017-11-06 MED ORDER — MAGNESIUM OXIDE 400 (241.3 MG) MG PO TABS
400.0000 mg | ORAL_TABLET | Freq: Every day | ORAL | Status: DC
  Administered 2017-11-07 – 2017-11-08 (×2): 400 mg via ORAL
  Filled 2017-11-06 (×2): qty 1

## 2017-11-06 MED ORDER — ALBUTEROL SULFATE (2.5 MG/3ML) 0.083% IN NEBU: 2.5000 mg | INHALATION_SOLUTION | RESPIRATORY_TRACT | Status: DC | PRN

## 2017-11-06 MED ORDER — BENAZEPRIL HCL 10 MG PO TABS
10.0000 mg | ORAL_TABLET | Freq: Every day | ORAL | Status: DC
  Administered 2017-11-07 – 2017-11-08 (×2): 10 mg via ORAL
  Filled 2017-11-06 (×2): qty 1

## 2017-11-06 MED ORDER — OMEGA-3-ACID ETHYL ESTERS 1 G PO CAPS
1.0000 g | ORAL_CAPSULE | Freq: Every day | ORAL | Status: DC
  Administered 2017-11-07 – 2017-11-08 (×2): 1 g via ORAL
  Filled 2017-11-06 (×2): qty 1

## 2017-11-06 MED ORDER — ALBUTEROL SULFATE HFA 108 (90 BASE) MCG/ACT IN AERS: 2.0000 | INHALATION_SPRAY | RESPIRATORY_TRACT | Status: DC | PRN

## 2017-11-06 MED ORDER — TIZANIDINE HCL 4 MG PO TABS
4.0000 mg | ORAL_TABLET | Freq: Every day | ORAL | Status: DC
  Administered 2017-11-06 – 2017-11-07 (×2): 4 mg via ORAL
  Filled 2017-11-06 (×2): qty 1

## 2017-11-06 MED ORDER — HYDROCHLOROTHIAZIDE 10 MG/ML ORAL SUSPENSION
6.2500 mg | Freq: Every day | ORAL | Status: DC
  Administered 2017-11-07 – 2017-11-08 (×2): 6.25 mg via ORAL
  Filled 2017-11-06 (×4): qty 1.25

## 2017-11-06 MED ORDER — BENAZEPRIL-HYDROCHLOROTHIAZIDE 20-12.5 MG PO TABS: 0.5000 | ORAL_TABLET | Freq: Every day | ORAL | Status: DC

## 2017-11-06 MED ORDER — IPRATROPIUM-ALBUTEROL 0.5-2.5 (3) MG/3ML IN SOLN
3.0000 mL | RESPIRATORY_TRACT | Status: DC | PRN
  Administered 2017-11-06: 3 mL via RESPIRATORY_TRACT
  Filled 2017-11-06: qty 3

## 2017-11-06 MED ORDER — VANCOMYCIN HCL IN DEXTROSE 1-5 GM/200ML-% IV SOLN
1000.0000 mg | Freq: Once | INTRAVENOUS | Status: AC
  Administered 2017-11-06: 19:00:00 1000 mg via INTRAVENOUS
  Filled 2017-11-06: qty 200

## 2017-11-06 MED ORDER — VANCOMYCIN HCL 10 G IV SOLR
1250.0000 mg | INTRAVENOUS | Status: DC
  Administered 2017-11-07: 1250 mg via INTRAVENOUS
  Filled 2017-11-06 (×2): qty 1250

## 2017-11-06 MED ORDER — MOMETASONE FURO-FORMOTEROL FUM 200-5 MCG/ACT IN AERO
2.0000 | INHALATION_SPRAY | Freq: Two times a day (BID) | RESPIRATORY_TRACT | Status: DC
  Administered 2017-11-07 – 2017-11-08 (×3): 2 via RESPIRATORY_TRACT
  Filled 2017-11-06 (×2): qty 8.8

## 2017-11-06 MED ORDER — METOPROLOL TARTRATE 50 MG PO TABS
50.0000 mg | ORAL_TABLET | Freq: Two times a day (BID) | ORAL | Status: DC
  Administered 2017-11-06 – 2017-11-08 (×3): 50 mg via ORAL
  Filled 2017-11-06 (×4): qty 1

## 2017-11-06 MED ORDER — IPRATROPIUM-ALBUTEROL 0.5-2.5 (3) MG/3ML IN SOLN
RESPIRATORY_TRACT | Status: AC
  Administered 2017-11-06: 3 mL via RESPIRATORY_TRACT
  Filled 2017-11-06: qty 3

## 2017-11-06 MED ORDER — IOPAMIDOL (ISOVUE-370) INJECTION 76%
75.0000 mL | Freq: Once | INTRAVENOUS | Status: AC | PRN
  Administered 2017-11-06: 75 mL via INTRAVENOUS

## 2017-11-06 MED ORDER — LORAZEPAM 0.5 MG PO TABS: 0.5000 mg | ORAL_TABLET | Freq: Two times a day (BID) | ORAL | Status: DC | PRN

## 2017-11-06 MED ORDER — SODIUM CHLORIDE 0.9 % IV SOLN
INTRAVENOUS | Status: AC
  Administered 2017-11-06 – 2017-11-07 (×3): via INTRAVENOUS

## 2017-11-06 MED ORDER — ATORVASTATIN CALCIUM 20 MG PO TABS
80.0000 mg | ORAL_TABLET | Freq: Every day | ORAL | Status: DC
  Administered 2017-11-07: 80 mg via ORAL
  Filled 2017-11-06 (×2): qty 4

## 2017-11-06 MED ORDER — KETOTIFEN FUMARATE 0.025 % OP SOLN
1.0000 [drp] | Freq: Two times a day (BID) | OPHTHALMIC | Status: DC
  Administered 2017-11-07 – 2017-11-08 (×3): 1 [drp] via OPHTHALMIC
  Filled 2017-11-06: qty 5

## 2017-11-06 MED ORDER — MAGNESIUM 400 MG PO CAPS: 1.0000 | ORAL_CAPSULE | Freq: Every day | ORAL | Status: DC

## 2017-11-06 MED ORDER — TAMSULOSIN HCL 0.4 MG PO CAPS
0.4000 mg | ORAL_CAPSULE | Freq: Every day | ORAL | Status: DC
  Administered 2017-11-07 – 2017-11-08 (×2): 0.4 mg via ORAL
  Filled 2017-11-06 (×2): qty 1

## 2017-11-06 MED ORDER — HYDROCODONE-ACETAMINOPHEN 10-325 MG PO TABS
1.0000 | ORAL_TABLET | Freq: Four times a day (QID) | ORAL | Status: DC | PRN
  Administered 2017-11-06: 1 via ORAL
  Filled 2017-11-06: qty 1

## 2017-11-06 NOTE — ED Notes (Signed)
Attempted report to 1C who said they had changed patient room and would call back when new room was clean. Charge nurse and family made aware.

## 2017-11-06 NOTE — H&P (Signed)
[facility] [place] NAME: James [name]    MR#:  [id number]  DATE OF BIRTH:  1929-08-29  DATE OF ADMISSION:  11/06/2017  PRIMARY CARE PHYSICIAN: Birdie Sons, MD   REQUESTING/REFERRING PHYSICIAN:   CHIEF COMPLAINT:   Chief Complaint  Patient presents with  . Shortness of Breath    HISTORY OF PRESENT ILLNESS: James [name]  is a 83 y.o. male with a known history per below, recent hospital discharge for pneumonia-finished antibiotics last week on Monday, presents to the hospital with 2-day history of worsening productive cough with blood streaks, in the emergency room patient was found to be tachycardic with heart rate of 102, sodium 127, chloride 94, CT chest noted for increasing right lung mass/right sided pneumonia/negative for PE, EKG noted for anterior infarct, evaluated in the emergency room, in no apparent distress, resting comfortably in bed, significant other at the bedside, patient is now being admitted for probable acute healthcare associated pneumonia with hemoptysis, and increasing right lung mass suspicious for neoplasm.  PAST MEDICAL HISTORY:   Past Medical History:  Diagnosis Date  . Arthritis   . Back pain   . CAD (coronary artery disease)   . Cataract    right  . COPD (chronic obstructive pulmonary disease) ([place])    SPiriva and SYmbicort daily. Albuterol as needed  . Diverticulosis   . Dyspnea    with exertion  . Enlarged prostate    takes Flomax daily  . GERD (gastroesophageal reflux disease)   . History of chicken pox   . History of gout   . History of measles   . History of mumps   . HOH (hard of hearing)   . Hyperglycemia   . Hyperlipidemia    takes Atorvastatin daily  . Hypertension    takes Metoprolol daily as well as Lotensin HCT  . Hypotension   . Joint pain   . Microscopic colitis   . PAF (paroxysmal atrial fibrillation) ([place]), RVR 03/14/2017  . Prostate cancer ([place])   . TIA (transient ischemic attack)    . Weakness    numbness and tingling.mainly on right    PAST SURGICAL HISTORY:  Past Surgical History:  Procedure Laterality Date  . ANTERIOR CERVICAL DECOMP/DISCECTOMY FUSION N/A 03/07/2017   Procedure: ANTERIOR CERVICAL DECOMPRESSION/DISCECTOMY FUSION CERVICAL THREE- CERVICAL FOUR, CERVICAL FOUR- CERVICAL FIVE;  Surgeon: Newman Pies, MD;  Location: [facility];  Service: Neurosurgery;  Laterality: N/A;  ANTERIOR CERVICAL DECOMPRESSION/DISCECTOMY FUSION CERVICAL 3- CERVICAL 4, CERVIACL 4- CERVICAL 5  . APPENDECTOMY  1950  . CARDIAC CATHETERIZATION  05/29/2013   EF=40-45%. Moderate pulmonary hypertension. Infero/ lateral hypokinesis  . cataract surgery Left   . COLONOSCOPY    . CORONARY ARTERY BYPASS GRAFT  2011   x 5  . MRI BRAIN  06/07/2013   Mild chronic involutional changes. No acute abnormalities  . MRI of neck    . Myocardial Perfusion Scan  05/29/2013   Abnormal myocardial perfusion image consistent with myocardial infarction  . PROSTATE SURGERY  11/18/2012   radiation seed  . TOTAL HIP ARTHROPLASTY Left 1997   hip joint replacement    SOCIAL HISTORY:  Social History   Tobacco Use  . Smoking status: Former Smoker    Packs/day: 1.00    Years: 11.00    Pack years: 11.00    Types: Cigarettes  . Smokeless tobacco: Never Used  . Tobacco comment: quit smoking 30 yrs ago  Substance Use Topics  . Alcohol  use: Yes    Alcohol/week: 0.0 oz    Comment: beer occasionally    FAMILY HISTORY:  Family History  Problem Relation Age of Onset  . Heart attack Mother   . Stroke Father   . Heart attack Father   . Hypertension Father   . Heart attack Sister   . Bladder Cancer Brother   . Kidney cancer Brother   . Heart Problems Brother     DRUG ALLERGIES:  Allergies  Allergen Reactions  . Indomethacin Nausea Only    REVIEW OF SYSTEMS:   CONSTITUTIONAL: No fever, +fatigue/weakness.  EYES: No blurred or double vision.  EARS, NOSE, AND THROAT: No tinnitus or ear pain.   RESPIRATORY: + cough/shortness of breath, wheezing or hemoptysis.  CARDIOVASCULAR: No chest pain, orthopnea, edema.  GASTROINTESTINAL: No nausea, vomiting, diarrhea or abdominal pain.  GENITOURINARY: No dysuria, hematuria.  ENDOCRINE: No polyuria, nocturia,  HEMATOLOGY: No anemia, easy bruising or bleeding SKIN: No rash or lesion. MUSCULOSKELETAL: No joint pain or arthritis.   NEUROLOGIC: No tingling, numbness, weakness.  PSYCHIATRY: No anxiety or depression.   MEDICATIONS AT HOME:  Prior to Admission medications   Medication Sig Start Date End Date Taking? Authorizing Provider  apixaban (ELIQUIS) 5 MG TABS tablet Take 1 tablet (5 mg total) 2 (two) times daily by mouth. Start on 11/20 affter stopping PLAVIX and take this with baby aspirin 11/06/17  Yes Mody, Ulice Bold, MD  aspirin (ASPIRIN CHILDRENS) 81 MG chewable tablet Chew 1 tablet (81 mg total) daily by mouth. 10/31/17  Yes Mody, Sital, MD  atorvastatin (LIPITOR) 80 MG tablet TAKE 1 TABLET BY MOUTH DAILY 10/08/17  Yes Birdie Sons, MD  azelastine (OPTIVAR) 0.05 % ophthalmic solution Apply 1 drop to eye 2 (two) times daily. 08/23/16  Yes Birdie Sons, MD  benazepril-hydrochlorthiazide (LOTENSIN HCT) 20-12.5 MG tablet Take 0.5 tablets by mouth daily. 07/10/17  Yes Birdie Sons, MD  beta carotene w/minerals (OCUVITE) tablet Take 1 tablet by mouth daily.   Yes [provider]  clopidogrel (PLAVIX) 75 MG tablet Take 75 mg by mouth daily.   Yes [provider]  fluticasone (FLONASE) 50 MCG/ACT nasal spray Place 2 sprays into both nostrils daily. Patient taking differently: Place 2 sprays into both nostrils every evening.  11/13/16  Yes Birdie Sons, MD  lansoprazole (PREVACID) 30 MG capsule TAKE 1 CAPSULE BY MOUTH EVERY DAY 08/06/17  Yes Birdie Sons, MD  Magnesium 400 MG CAPS Take 1 capsule by mouth daily.    Yes [provider]  metoprolol (LOPRESSOR) 50 MG tablet Take 1 tablet (50 mg total) by mouth  2 (two) times daily. 08/25/16  Yes Birdie Sons, MD  montelukast (SINGULAIR) 10 MG tablet TAKE ONE TABLET BY MOUTH EVERY DAY 07/18/17  Yes Birdie Sons, MD  MULTIPLE VITAMIN PO Take 1 tablet by mouth daily.    Yes [provider]  Omega-3 Fatty Acids (FISH OIL) 1000 MG CAPS Take 1 capsule daily by mouth.   Yes [provider]  SPIRIVA HANDIHALER 18 MCG inhalation capsule INHALE 1 CAPSULE AS DIRECTED ONCE A DAY 01/16/17  Yes Birdie Sons, MD  SYMBICORT 160-4.5 MCG/ACT inhaler INHALE 2 PUFFS INTO THE LUNGS TWICE DAILY 07/23/17  Yes Birdie Sons, MD  tamsulosin (FLOMAX) 0.4 MG CAPS capsule Take 1 capsule (0.4 mg total) by mouth daily. 11/06/16  Yes Birdie Sons, MD  tiZANidine (ZANAFLEX) 4 MG tablet TAKE ONE TABLET AT BEDTIME Patient taking differently: TAKE  ONE TABLET AT BEDTIME AS NEEDED FOR SPASMS 01/07/17  Yes Birdie Sons, MD  docusate sodium (COLACE) 100 MG capsule Take 1 capsule (100 mg total) by mouth 2 (two) times daily. Patient taking differently: Take 100 mg 2 (two) times daily as needed by mouth for mild constipation.  03/08/17   Newman Pies, MD  HYDROcodone-acetaminophen [facility]) 10-325 MG tablet Take 1 tablet by mouth every 6 (six) hours as needed. 07/10/17   Birdie Sons, MD  LORazepam (ATIVAN) 1 MG tablet TAKE 1 TABLET AT BEDTIME AS NEEDED FOR 10/15/17   Birdie Sons, MD  PROAIR HFA 108 [phone] Base) MCG/ACT inhaler INHALE 2 PUFFS INTO LUNGS EVERY 6 HOURS AS NEEDED FOR WHEEZING OR SHORTNESS OF BREATH 08/13/17   Laverle Hobby, MD      PHYSICAL EXAMINATION:   VITAL SIGNS: Blood pressure (!) 143/79, pulse 95, temperature 99.9 F (37.7 C), temperature source Oral, resp. rate 17, height 5\' 8"  (1.727 m), weight 85.3 kg (188 lb), SpO2 97 %.  GENERAL:  83 y.o.-year-old patient lying in the bed with no acute distress.  Obese, frail appearing eYES: Pupils equal, round, reactive to light and accommodation. No scleral icterus. Extraocular muscles  intact.  HEENT: Head atraumatic, normocephalic. Oropharynx and nasopharynx clear.  NECK:  Supple, no jugular venous distention. No thyroid enlargement, no tenderness.  LUNGS: Rales at right lung base, no wheezing, rales,rhonchi or crepitation. No use of accessory muscles of respiration.  CARDIOVASCULAR: S1, S2 normal. No murmurs, rubs, or gallops.  ABDOMEN: Soft, nontender, nondistended. Bowel sounds present. No organomegaly or mass.  EXTREMITIES: No pedal edema, cyanosis, or clubbing.  NEUROLOGIC: Cranial nerves II through XII are intact. MAES. Gait not checked.  PSYCHIATRIC: The patient is alert and oriented x 3.  SKIN: No obvious rash, lesion, or ulcer.   LABORATORY PANEL:   CBC Recent Labs  Lab 11/06/17 1123  WBC 9.9  HGB 14.6  HCT 44.3  PLT 168  MCV 86.4  MCH 28.5  MCHC 33.0  RDW 15.4*   ------------------------------------------------------------------------------------------------------------------  Chemistries  Recent Labs  Lab 11/06/17 1123  NA 127*  K 4.5  CL 94*  CO2 22  GLUCOSE 120*  BUN 16  CREATININE 1.00  CALCIUM 8.9  AST 30  ALT 28  ALKPHOS 80  BILITOT 0.9   ------------------------------------------------------------------------------------------------------------------ estimated creatinine clearance is 56.4 mL/min (by C-G formula based on SCr of 1 mg/dL). ------------------------------------------------------------------------------------------------------------------ No results for input(s): TSH, T4TOTAL, T3FREE, THYROIDAB in the last 72 hours.  Invalid input(s): FREET3   Coagulation profile No results for input(s): INR, PROTIME in the last 168 hours. ------------------------------------------------------------------------------------------------------------------- No results for input(s): DDIMER in the last 72  hours. -------------------------------------------------------------------------------------------------------------------  Cardiac Enzymes Recent Labs  Lab 11/06/17 1123  TROPONINI <0.03   ------------------------------------------------------------------------------------------------------------------ Invalid input(s): POCBNP  ---------------------------------------------------------------------------------------------------------------  Urinalysis No results found for: COLORURINE, APPEARANCEUR, LABSPEC, PHURINE, GLUCOSEU, HGBUR, BILIRUBINUR, KETONESUR, PROTEINUR, UROBILINOGEN, NITRITE, LEUKOCYTESUR   RADIOLOGY: Dg Chest 2 View  Result Date: 11/06/2017 CLINICAL DATA:  83 year old male with increase shortness of breath for 2 days. Pneumonia status post course of antibiotics. Productive cough with blood-tinged sputum. EXAM: CHEST  2 VIEW COMPARISON:  10/30/2017 and earlier. FINDINGS: Recurrent patchy and coarse opacity throughout the right lung appearing similar to the 10/16/2017 radiographs. This was resolved on the intervening 10/30/2017 study. Underlying chronic elevation of the right hemidiaphragm and chronic right lung base atelectasis. No pneumothorax. No definite pleural effusion. The left lung is stable in clear. Stable cardiac size and mediastinal contours. Prior CABG.  Negative visible bowel gas pattern. Calcified aortic atherosclerosis. Stable visualized osseous structures. IMPRESSION: 1. Recurrent patchy and coarse opacity throughout the mid right lung similar to the 10/16/2017 exam, although was resolved on the intervening 10/30/2017 exam. Consider recurrent bronchopneumonia versus recurrent pulmonary hemorrhage versus recurrent noninfectious inflammation such as vasculitis. 2. Chronic elevation of the right hemidiaphragm with superimposed right lung base atelectasis. 3. The left lung remains clear. 4.  Calcified aortic atherosclerosis. Electronically Signed   By: Genevie Ann M.D.   On:  11/06/2017 11:53   Ct Angio Chest Pe W And/or Wo Contrast  Result Date: 11/06/2017 CLINICAL DATA:  Shortness of breath for 2 days. Recently treated for pneumonia. Cough with blood-tinged sputum. EXAM: CT ANGIOGRAPHY CHEST WITH CONTRAST TECHNIQUE: Multidetector CT imaging of the chest was performed using the standard protocol during bolus administration of intravenous contrast. Multiplanar CT image reconstructions and MIPs were obtained to evaluate the vascular anatomy. CONTRAST:  64mL ISOVUE-370 IOPAMIDOL (ISOVUE-370) INJECTION 76% COMPARISON:  Chest radiographs 11/06/2017 and earlier. PET-CT 09/25/2017. Chest CT 09/17/2017. FINDINGS: Cardiovascular: Pulmonary arterial opacification is adequate without evidence of emboli. Sequelae of prior CABG are again identified. There is aortic and extensive coronary artery atherosclerosis. The heart is borderline enlarged. There is no pericardial effusion. Mediastinum/Nodes: No enlarged axillary, mediastinal, or hilar lymph nodes. Small sliding hiatal hernia. Unremarkable thyroid. Lungs/Pleura: No pleural effusion or pneumothorax. Chronic elevation of the right hemidiaphragm with right middle and lower lobe atelectasis. Patchy consolidation and peribronchovascular nodularity throughout the right lung with some air bronchograms, increased from the prior chest CT. As noted on today's earlier chest radiographs, right lung airspace disease had resolved on interval 10/30/2017 radiographs. Masslike consolidation in the posterior right costophrenic sulcus has increased from the prior PET-CT, measuring 6.6 x 2.2 cm. There is no left lung consolidation. Upper Abdomen: No acute abnormality. Musculoskeletal: Chronic T4 and T10 vertebral body fractures. No suspicious osseous lesion. Review of the MIP images confirms the above findings. IMPRESSION: 1. No evidence of pulmonary emboli. 2. Patchy consolidation throughout the right lung concerning for recurrent pneumonia. 3. Mildly  increased size of masslike consolidation in the right lung base, previously evaluated with PET-CT. Some of this apparent enlargement could be secondary to surrounding superimposed acute consolidation. Follow-up chest CT is recommended in 2-3 months for reassessment after the acute process has resolved. 4. Aortic Atherosclerosis (ICD10-I70.0). Electronically Signed   By: Logan Bores M.D.   On: 11/06/2017 13:11    EKG: Orders placed or performed during the hospital encounter of 11/06/17  . EKG 12-Lead  . EKG 12-Lead  . ED EKG  . ED EKG    IMPRESSION AND PLAN: 1 acute probable healthcare associated pneumonia/probable postobstructive pneumonia Status post recent hospital admission, completed antibiotics 1 week ago, increasing right lung mass suspicious for neoplasm-currently being followed by oncology, probable immunocompromised state Admit to regular nursing floor bed on our pneumonia protocol, cefepime/vancomycin for 5-day course, follow-up on cultures, supplemental oxygen as needed  2 acute hemoptysis Most likely exacerbated by above Avoid antiplatelet agents, hold Eliquis, and continue close medical monitoring  3 acute on chronic right lung mass Increasing in size, suspicious for neoplasm Consult oncology for continuity of care  4COPD Without exacerbation  5 chronic paroxysmal A. Fib Hold Eliquis given hemoptysis  6 acute on chronic hyponatremia Exacerbated by pneumonia IV fluids for rehydration and salt tablets Check BMP in the morning  7 recent CVA Continue statin therapy, antiplatelet agents currently on hold given hemoptysis, and continue close medical monitoring  Full code Condition stable Prognosis-defer to oncology DVT prophylaxis with SCDs/TED hose Disposition Home in 2-3 days   All the records are reviewed and case discussed with ED provider. Management plans discussed with the patient, family and they are in agreement.  CODE STATUS: Code Status History     Date Active Date Inactive Code Status Order ID Comments User Context   10/30/2017 11:34 10/31/2017 18:01 Full Code [id number]  Demetrios Loll, MD Inpatient   03/13/2017 18:20 03/14/2017 20:51 Full Code [id number]  Newman Pies, MD Inpatient   03/07/2017 18:49 03/08/2017 16:42 Full Code [id number]  Newman Pies, MD Inpatient    Advance Directive Documentation     Most Recent Value  Type of Advance Directive  Healthcare Power of Attorney, Living will  Pre-existing out of facility DNR order (yellow form or pink MOST form)  No data  "MOST" Form in Place?  No data       TOTAL TIME TAKING CARE OF THIS PATIENT: 40 minutes.    Avel Peace Salary M.D on 11/06/2017   Between 7am to 6pm - Pager - [phone]  After 6pm go to [email or web address] - password [email or web address] [facility]  Office  [phone]  CC: Primary care physician; Birdie Sons, MD   Note: This dictation was prepared with Dragon dictation along with smaller phrase technology. Any transcriptional errors that result from this process are unintentional.

## 2017-11-06 NOTE — ED Provider Notes (Signed)
[facility] Emergency Department Provider Note   ____________________________________________   First MD Initiated Contact with Patient 11/06/17 1102     (approximate)  I have reviewed the triage vital signs and the nursing notes.   HISTORY  Chief Complaint Shortness of Breath    HPI James [name] is a 83 y.o. male Who comes in reporting a recent hospitalization for pneumonia. He got better was discharged finished his antibiotics and has since been getting more and more short of breath O2 sats today were 90-92 when up to 96 on 2 L. He's been coughing up some mucous with streaks of blood in it. Low-grade fever.no other real complaints.   Past Medical History:  Diagnosis Date  . Arthritis   . Back pain   . CAD (coronary artery disease)   . Cataract    right  . COPD (chronic obstructive pulmonary disease) ([place])    SPiriva and SYmbicort daily. Albuterol as needed  . Diverticulosis   . Dyspnea    with exertion  . Enlarged prostate    takes Flomax daily  . GERD (gastroesophageal reflux disease)   . History of chicken pox   . History of gout   . History of measles   . History of mumps   . HOH (hard of hearing)   . Hyperglycemia   . Hyperlipidemia    takes Atorvastatin daily  . Hypertension    takes Metoprolol daily as well as Lotensin HCT  . Hypotension   . Joint pain   . Microscopic colitis   . PAF (paroxysmal atrial fibrillation) ([place]), RVR 03/14/2017  . Prostate cancer ([place])   . TIA (transient ischemic attack)   . Weakness    numbness and tingling.mainly on right    Patient Active Problem List   Diagnosis Date Noted  . Cerebral vascular disease 10/31/2017  . CVA (cerebral vascular accident) ([place]) 10/31/2017  . Acute CVA (cerebrovascular accident) ([place]) 10/30/2017  . Lung mass 09/11/2017  . Hematoma of neck 03/14/2017  . PAF (paroxysmal atrial fibrillation) ([place]), RVR 03/14/2017  . Chronic systolic heart failure ([place]) 03/14/2017  .  Dysphagia 03/13/2017  . Cervical spondylosis with myelopathy 03/07/2017  . Restrictive lung disease 09/26/2016  . Cervical neck pain with evidence of disc disease 07/04/2016  . PVC (premature ventricular contraction) 11/16/2015  . Abdominal pain 07/27/2015  . Adenocarcinoma of prostate ([place]) 07/27/2015  . Arthritis 07/27/2015  . Blood in feces 07/27/2015  . BPH (benign prostatic hyperplasia) 07/27/2015  . COPD (chronic obstructive pulmonary disease) ([place]) 07/27/2015  . GERD (gastroesophageal reflux disease) 07/27/2015  . Gout 07/27/2015  . Hyperglycemia 07/27/2015  . Hypotension 07/27/2015  . Insomnia 07/27/2015  . Shortness of breath 07/27/2015  . TIA (transient ischemic attack) 07/27/2015  . Atrial fibrillation ([place]) 11/16/2014  . BP (high blood pressure) 10/13/2014  . Urinary hesitancy 09/02/2012  . ED (erectile dysfunction) of organic origin 06/10/2010  . CAD (coronary artery disease) 03/14/2010  . LBP (low back pain) 03/01/2009  . Allergic rhinitis 09/28/2008  . Essential (primary) hypertension 08/02/2007  . Hyperlipidemia 08/02/2007  . Acquired spondylolisthesis 03/20/2005  . Diverticulosis of colon without hemorrhage 12/18/2000  . Arthropathy 12/18/1998    Past Surgical History:  Procedure Laterality Date  . ANTERIOR CERVICAL DECOMP/DISCECTOMY FUSION N/A 03/07/2017   Procedure: ANTERIOR CERVICAL DECOMPRESSION/DISCECTOMY FUSION CERVICAL THREE- CERVICAL FOUR, CERVICAL FOUR- CERVICAL FIVE;  Surgeon: Newman Pies, MD;  Location: [facility];  Service: Neurosurgery;  Laterality: N/A;  ANTERIOR CERVICAL DECOMPRESSION/DISCECTOMY FUSION CERVICAL  3- CERVICAL 4, CERVIACL 4- CERVICAL 5  . APPENDECTOMY  1950  . CARDIAC CATHETERIZATION  05/29/2013   EF=40-45%. Moderate pulmonary hypertension. Infero/ lateral hypokinesis  . cataract surgery Left   . COLONOSCOPY    . CORONARY ARTERY BYPASS GRAFT  2011   x 5  . MRI BRAIN  06/07/2013   Mild chronic involutional changes. No acute  abnormalities  . MRI of neck    . Myocardial Perfusion Scan  05/29/2013   Abnormal myocardial perfusion image consistent with myocardial infarction  . PROSTATE SURGERY  11/18/2012   radiation seed  . TOTAL HIP ARTHROPLASTY Left 1997   hip joint replacement    Prior to Admission medications   Medication Sig Start Date End Date Taking? Authorizing Provider  apixaban (ELIQUIS) 5 MG TABS tablet Take 1 tablet (5 mg total) 2 (two) times daily by mouth. Start on 11/20 affter stopping PLAVIX and take this with baby aspirin 11/06/17   Bettey Costa, MD  aspirin (ASPIRIN CHILDRENS) 81 MG chewable tablet Chew 1 tablet (81 mg total) daily by mouth. 10/31/17   Bettey Costa, MD  atorvastatin (LIPITOR) 80 MG tablet TAKE 1 TABLET BY MOUTH DAILY 10/08/17   Birdie Sons, MD  azelastine (OPTIVAR) 0.05 % ophthalmic solution Apply 1 drop to eye 2 (two) times daily. 08/23/16   Birdie Sons, MD  benazepril-hydrochlorthiazide (LOTENSIN HCT) 20-12.5 MG tablet Take 0.5 tablets by mouth daily. 07/10/17   Birdie Sons, MD  beta carotene w/minerals (OCUVITE) tablet Take 1 tablet by mouth daily.    [provider]  docusate sodium (COLACE) 100 MG capsule Take 1 capsule (100 mg total) by mouth 2 (two) times daily. Patient taking differently: Take 100 mg 2 (two) times daily as needed by mouth for mild constipation.  03/08/17   Newman Pies, MD  fluticasone [facility]) 50 MCG/ACT nasal spray Place 2 sprays into both nostrils daily. Patient taking differently: Place 2 sprays into both nostrils every evening.  11/13/16   Birdie Sons, MD  HYDROcodone-acetaminophen (NORCO) 10-325 MG tablet Take 1 tablet by mouth every 6 (six) hours as needed. 07/10/17   Birdie Sons, MD  lansoprazole (PREVACID) 30 MG capsule TAKE 1 CAPSULE BY MOUTH EVERY DAY 08/06/17   Birdie Sons, MD  LORazepam (ATIVAN) 1 MG tablet TAKE 1 TABLET AT BEDTIME AS NEEDED FOR 10/15/17   Birdie Sons, MD  Magnesium 400 MG CAPS Take 1  capsule by mouth daily.     [provider]  metoprolol (LOPRESSOR) 50 MG tablet Take 1 tablet (50 mg total) by mouth 2 (two) times daily. 08/25/16   Birdie Sons, MD  montelukast (SINGULAIR) 10 MG tablet TAKE ONE TABLET BY MOUTH EVERY DAY 07/18/17   Birdie Sons, MD  MULTIPLE VITAMIN PO Take 1 tablet by mouth daily.     [provider]  Omega-3 Fatty Acids (FISH OIL) 1000 MG CAPS Take 1 capsule daily by mouth.    [provider]  PROAIR HFA 108 [id number] Base) MCG/ACT inhaler INHALE 2 PUFFS INTO LUNGS EVERY 6 HOURS AS NEEDED FOR WHEEZING OR SHORTNESS OF BREATH 08/13/17   Laverle Hobby, MD  SPIRIVA HANDIHALER 18 MCG inhalation capsule INHALE 1 CAPSULE AS DIRECTED ONCE A DAY 01/16/17   Birdie Sons, MD  SYMBICORT 160-4.5 MCG/ACT inhaler INHALE 2 PUFFS INTO THE LUNGS TWICE DAILY 07/23/17   Birdie Sons, MD  tamsulosin (FLOMAX) 0.4 MG CAPS capsule Take 1 capsule (0.4 mg total) by mouth  daily. 11/06/16   Birdie Sons, MD  tiZANidine (ZANAFLEX) 4 MG tablet TAKE ONE TABLET AT BEDTIME Patient taking differently: TAKE ONE TABLET AT BEDTIME AS NEEDED FOR SPASMS 01/07/17   Birdie Sons, MD    Allergies Indomethacin  Family History  Problem Relation Age of Onset  . Heart attack Mother   . Stroke Father   . Heart attack Father   . Hypertension Father   . Heart attack Sister   . Bladder Cancer Brother   . Kidney cancer Brother   . Heart Problems Brother     Social History Social History   Tobacco Use  . Smoking status: Former Smoker    Packs/day: 1.00    Years: 11.00    Pack years: 11.00    Types: Cigarettes  . Smokeless tobacco: Never Used  . Tobacco comment: quit smoking 30 yrs ago  Substance Use Topics  . Alcohol use: Yes    Alcohol/week: 0.0 oz    Comment: beer occasionally  . Drug use: No    Review of Systems  Constitutional: No fever/chills Eyes: No visual changes. ENT: No sore throat. Cardiovascular: Denies chest  pain. Respiratory:  shortness of breath. Gastrointestinal: No abdominal pain.  No nausea, no vomiting.  No diarrhea.  No constipation. Genitourinary: Negative for dysuria. Musculoskeletal: Negative for back pain. Skin: Negative for rash. Neurological: Negative for headaches, focal weakness  ____________________________________________   PHYSICAL EXAM:  VITAL SIGNS: ED Triage Vitals  Enc Vitals Group     BP 11/06/17 1109 (!) 143/79     Pulse Rate 11/06/17 1109 86     Resp 11/06/17 1109 19     Temp 11/06/17 1109 99.9 F (37.7 C)     Temp Source 11/06/17 1109 Oral     SpO2 11/06/17 1109 95 %     Weight 11/06/17 1106 188 lb (85.3 kg)     Height 11/06/17 1106 5\' 8"  (1.727 m)     Head Circumference --      Peak Flow --      Pain Score --      Pain Loc --      Pain Edu? --      Excl. in [place]? --     Constitutional: Alert and oriented. Well appearing and in no acute distress. Eyes: Conjunctivae are normal.  Head: Atraumatic. Nose: No congestion/rhinnorhea. Mouth/Throat: Mucous membranes are moist.  Oropharynx non-erythematous. Neck: No stridor.   Cardiovascular: Normal rate, regular rhythm. Grossly normal heart sounds.  Good peripheral circulation. Respiratory: Normal respiratory effort.  No retractions. Lungs CTAB. Gastrointestinal: Soft and nontender. No distention. No abdominal bruits. No CVA tenderness. Musculoskeletal: No lower extremity tenderness nor edema.  No joint effusions. Neurologic:  Normal speech and language. No gross focal neurologic deficits are appreciated. No gait instability. Skin:  Skin is warm, dry and intact. No rash noted. Psychiatric: Mood and affect are normal. Speech and behavior are normal.  ____________________________________________   LABS (all labs ordered are listed, but only abnormal results are displayed)  Labs Reviewed  BASIC METABOLIC PANEL - Abnormal; Notable for the following components:      Result Value   Sodium 127 (*)     Chloride 94 (*)    Glucose, Bld 120 (*)    All other components within normal limits  CBC - Abnormal; Notable for the following components:   RDW 15.4 (*)    All other components within normal limits  TROPONIN I  HEPATIC FUNCTION PANEL   ____________________________________________  EKG  EKG read and interpreted by me shows normal sinus rhythm rate of 87 left axis left bundle-branch block no acute changes_EKG looks similar to one from the 11th of this month____________________________________  RADIOLOGY  CT of the chest shows an increasing mass and a new or recurrent pneumonia ____________________________________________   PROCEDURES  Procedure(s) performed:  Procedures  Critical Care performed:   ____________________________________________   INITIAL IMPRESSION / ASSESSMENT AND PLAN / ED COURSE  patient is hyponatremic down to 127 with an increasing mass dyspnea and pneumonia. We will treat with antibiotics and admit him. Had been admitted recently for pneumonia and had improved but is now worsening again.     ____________________________________________   FINAL CLINICAL IMPRESSION(S) / ED DIAGNOSES  Final diagnoses:  Healthcare-associated pneumonia  Hyponatremia     ED Discharge Orders    None       Note:  This document was prepared using Dragon voice recognition software and may include unintentional dictation errors.    Nena Polio, MD 11/06/17 [phone]

## 2017-11-06 NOTE — ED Triage Notes (Signed)
Patient from home via [facility]. Patient reports increased SOB x2 days. Diagnosed last week with pneumonia and states he completed course of antibiotic. Reports hard productive cough with blood-tinged sputum. Patient on plavix for previous MI. History of COPD. Patient able to speak in complete sentences upon arrival.

## 2017-11-06 NOTE — Progress Notes (Signed)
Pharmacy Antibiotic Note  James [name] is a 83 y.o. male admitted on 11/06/2017 with pneumonia.  Pharmacy has been consulted for vancomycin dosing.  Patient is also on cefepime.  Plan: Vancomycin 1000 mg IV now followed by vancomycin 1250 mg IV q24h Goal VT 15-20 mcg/mL Kinetics: Using adjusted body weight = 74 kg, CrCl = 55 mL/min Ke: 0.050 Half-life: 14 hrs Vd: 52 L  Height: 5\' 8"  (172.7 cm) Weight: 183 lb 4.8 oz (83.1 kg) IBW/kg (Calculated) : 68.4  Temp (24hrs), Avg:99.1 F (37.3 C), Min:98.3 F (36.8 C), Max:99.9 F (37.7 C)  Recent Labs  Lab 11/06/17 1123  WBC 9.9  CREATININE 1.00    Estimated Creatinine Clearance: 55.7 mL/min (by C-G formula based on SCr of 1 mg/dL).    Allergies  Allergen Reactions  . Indomethacin Nausea Only   Antimicrobials this admission: vancomycin 11/20 >>  cefepime 11/20 >>   Dose adjustments this admission:  Microbiology results: 11/20 BCx: Sent 11/20 Sputum: Sent  11/20 MRSA PCR: Ordered  Thank you for allowing pharmacy to be a part of this patient's care.  Lenis Noon, PharmD, 11/06/2017 6:46 PM

## 2017-11-06 NOTE — ED Notes (Signed)
Hooked patient up to monitor. 

## 2017-11-07 ENCOUNTER — Inpatient Hospital Stay: Payer: Medicare Other | Admitting: Family Medicine

## 2017-11-07 LAB — BASIC METABOLIC PANEL
Anion gap: 7 (ref 5–15)
BUN: 18 mg/dL (ref 6–20)
CO2: 25 mmol/L (ref 22–32)
Calcium: 8.2 mg/dL — ABNORMAL LOW (ref 8.9–10.3)
Chloride: 101 mmol/L (ref 101–111)
Creatinine, Ser: 0.95 mg/dL (ref 0.61–1.24)
GFR calc Af Amer: >60 mL/min (ref >60)
GFR calc non Af Amer: >60 mL/min (ref >60)
Glucose, Bld: 102 mg/dL — ABNORMAL HIGH (ref 65–99)
Potassium: 4.1 mmol/L (ref 3.5–5.1)
Sodium: 133 mmol/L — ABNORMAL LOW (ref 135–145)

## 2017-11-07 LAB — STREP PNEUMONIAE URINARY ANTIGEN: Strep Pneumo Urinary Antigen: NEGATIVE

## 2017-11-07 MED ORDER — APIXABAN 5 MG PO TABS
5.0000 mg | ORAL_TABLET | Freq: Two times a day (BID) | ORAL | Status: DC
  Administered 2017-11-07 – 2017-11-08 (×3): 5 mg via ORAL
  Filled 2017-11-07 (×3): qty 1

## 2017-11-07 NOTE — Progress Notes (Addendum)
[facility] [place] NAME: James [name]    MR#:  [id number]  DATE OF BIRTH:  06/26/1929  SUBJECTIVE:  CHIEF COMPLAINT:   Chief Complaint  Patient presents with  . Shortness of Breath   - hemoptysis is improving, still has cough and needing 2L oxygen acutely  REVIEW OF SYSTEMS:  Review of Systems  Constitutional: Positive for malaise/fatigue. Negative for chills and fever.  HENT: Negative for congestion, ear discharge, hearing loss and nosebleeds.   Eyes: Negative for blurred vision and double vision.  Respiratory: Positive for cough, hemoptysis, sputum production and shortness of breath. Negative for wheezing.   Cardiovascular: Negative for chest pain and palpitations.  Gastrointestinal: Negative for abdominal pain, constipation, diarrhea, nausea and vomiting.  Genitourinary: Negative for dysuria.  Musculoskeletal: Negative for myalgias.  Neurological: Negative for dizziness, speech change, focal weakness, seizures and headaches.  Psychiatric/Behavioral: Negative for depression.    DRUG ALLERGIES:   Allergies  Allergen Reactions  . Indomethacin Nausea Only    VITALS:  Blood pressure 125/72, pulse 74, temperature 97.7 F (36.5 C), temperature source Oral, resp. rate (!) 22, height 5\' 8"  (1.727 m), weight 83.1 kg (183 lb 4.8 oz), SpO2 95 %.  PHYSICAL EXAMINATION:  Physical Exam  GENERAL:  83 y.o.-year-old patient lying in the bed with no acute distress.  EYES: Pupils equal, round, reactive to light and accommodation. No scleral icterus. Extraocular muscles intact.  HEENT: Head atraumatic, normocephalic. Oropharynx and nasopharynx clear.  NECK:  Supple, no jugular venous distention. No thyroid enlargement, no tenderness.  LUNGS: Normal breath sounds bilaterally, no wheezing, rales  or crepitation. No use of accessory muscles of respiration, right basilar rhonchi and crackles.  CARDIOVASCULAR: S1, S2 normal. No murmurs, rubs, or  gallops.  ABDOMEN: Soft, nontender, nondistended. Bowel sounds present. No organomegaly or mass.  EXTREMITIES: No pedal edema, cyanosis, or clubbing. Feet are cold to touch and purplish discolored NEUROLOGIC: Cranial nerves II through XII are intact. Muscle strength 5/5 in all extremities. Sensation intact. Gait not checked.  PSYCHIATRIC: The patient is alert and oriented x 3.  SKIN: No obvious rash, lesion, or ulcer.    LABORATORY PANEL:   CBC Recent Labs  Lab 11/06/17 1123  WBC 9.9  HGB 14.6  HCT 44.3  PLT 168   ------------------------------------------------------------------------------------------------------------------  Chemistries  Recent Labs  Lab 11/06/17 1123 11/07/17 0502  NA 127* 133*  K 4.5 4.1  CL 94* 101  CO2 22 25  GLUCOSE 120* 102*  BUN 16 18  CREATININE 1.00 0.95  CALCIUM 8.9 8.2*  AST 30  --   ALT 28  --   ALKPHOS 80  --   BILITOT 0.9  --    ------------------------------------------------------------------------------------------------------------------  Cardiac Enzymes Recent Labs  Lab 11/06/17 1123  TROPONINI <0.03   ------------------------------------------------------------------------------------------------------------------  RADIOLOGY:  Dg Chest 2 View  Result Date: 11/06/2017 CLINICAL DATA:  83 year old male with increase shortness of breath for 2 days. Pneumonia status post course of antibiotics. Productive cough with blood-tinged sputum. EXAM: CHEST  2 VIEW COMPARISON:  10/30/2017 and earlier. FINDINGS: Recurrent patchy and coarse opacity throughout the right lung appearing similar to the 10/16/2017 radiographs. This was resolved on the intervening 10/30/2017 study. Underlying chronic elevation of the right hemidiaphragm and chronic right lung base atelectasis. No pneumothorax. No definite pleural effusion. The left lung is stable in clear. Stable cardiac size and mediastinal contours. Prior CABG. Negative visible bowel gas  pattern. Calcified aortic atherosclerosis. Stable visualized osseous  structures. IMPRESSION: 1. Recurrent patchy and coarse opacity throughout the mid right lung similar to the 10/16/2017 exam, although was resolved on the intervening 10/30/2017 exam. Consider recurrent bronchopneumonia versus recurrent pulmonary hemorrhage versus recurrent noninfectious inflammation such as vasculitis. 2. Chronic elevation of the right hemidiaphragm with superimposed right lung base atelectasis. 3. The left lung remains clear. 4.  Calcified aortic atherosclerosis. Electronically Signed   By: Genevie Ann M.D.   On: 11/06/2017 11:53   Ct Angio Chest Pe W And/or Wo Contrast  Result Date: 11/06/2017 CLINICAL DATA:  Shortness of breath for 2 days. Recently treated for pneumonia. Cough with blood-tinged sputum. EXAM: CT ANGIOGRAPHY CHEST WITH CONTRAST TECHNIQUE: Multidetector CT imaging of the chest was performed using the standard protocol during bolus administration of intravenous contrast. Multiplanar CT image reconstructions and MIPs were obtained to evaluate the vascular anatomy. CONTRAST:  79mL ISOVUE-370 IOPAMIDOL (ISOVUE-370) INJECTION 76% COMPARISON:  Chest radiographs 11/06/2017 and earlier. PET-CT 09/25/2017. Chest CT 09/17/2017. FINDINGS: Cardiovascular: Pulmonary arterial opacification is adequate without evidence of emboli. Sequelae of prior CABG are again identified. There is aortic and extensive coronary artery atherosclerosis. The heart is borderline enlarged. There is no pericardial effusion. Mediastinum/Nodes: No enlarged axillary, mediastinal, or hilar lymph nodes. Small sliding hiatal hernia. Unremarkable thyroid. Lungs/Pleura: No pleural effusion or pneumothorax. Chronic elevation of the right hemidiaphragm with right middle and lower lobe atelectasis. Patchy consolidation and peribronchovascular nodularity throughout the right lung with some air bronchograms, increased from the prior chest CT. As noted on today's  earlier chest radiographs, right lung airspace disease had resolved on interval 10/30/2017 radiographs. Masslike consolidation in the posterior right costophrenic sulcus has increased from the prior PET-CT, measuring 6.6 x 2.2 cm. There is no left lung consolidation. Upper Abdomen: No acute abnormality. Musculoskeletal: Chronic T4 and T10 vertebral body fractures. No suspicious osseous lesion. Review of the MIP images confirms the above findings. IMPRESSION: 1. No evidence of pulmonary emboli. 2. Patchy consolidation throughout the right lung concerning for recurrent pneumonia. 3. Mildly increased size of masslike consolidation in the right lung base, previously evaluated with PET-CT. Some of this apparent enlargement could be secondary to surrounding superimposed acute consolidation. Follow-up chest CT is recommended in 2-3 months for reassessment after the acute process has resolved. 4. Aortic Atherosclerosis (ICD10-I70.0). Electronically Signed   By: Logan Bores M.D.   On: 11/06/2017 13:11    EKG:   Orders placed or performed during the hospital encounter of 11/06/17  . EKG 12-Lead  . EKG 12-Lead  . ED EKG  . ED EKG    ASSESSMENT AND PLAN:   83 year old male with multiple medical problems including CAD, arthritis, COPD not on home oxygen, hypertension, hyperlipidemia and recent admission for stroke and was discharged on eliquis due to history of paroxysmal atrial fibrillation last week comments with worsening cough and hemoptysis.  #1 acute hypoxic respiratory failure-secondary to right-sided pneumonia. -Acutely needing 2 L oxygen. Wean as tolerated. -Due to hospital-acquired pneumonia, on vancomycin and cefepime. As MRSA PCR is negative, discontinue vancomycin -Continue to wean oxygen. Continue antibiotics today  #2 hemoptysis-likely secondary to underlying pneumonia. Has known history of right lower lobe mass, following with pulmonary, has repeat CT follow-up in 3 months prior to  biopsy. -Restarted eliquis due to recent stroke. Hemoglobin is stable at this time. -If it worsens, hold eliquis and call pulmonary  #3  right lung mass-following with pulmonary. Has repeat CT scheduled in a couple of months. Will notify his pulmonologist. -No indication for oncology  consult without biopsy. Discontinue oncology consult  #4 hypertension-continue home medications. Patient on metoprolol, benazepril and hydrochlorothiazide  #5 Hyponatremia- from underlying lung mass and also dehydration on adm- improved with IV fluids  #5 recent acute CVA-last week, continue eliquis. Also on statin.  #6 COPD-stable. Continue inhalers and when necessary nebulizers  #7 DVT prophylaxis-we'll restart on eliquis     All the records are reviewed and case discussed with Care Management/Social Workerr. Management plans discussed with the patient, family and they are in agreement.  CODE STATUS: Full Code  TOTAL TIME TAKING CARE OF THIS PATIENT: 38 minutes.   POSSIBLE D/C IN 1-2 DAYS, DEPENDING ON CLINICAL CONDITION.   Gladstone Lighter M.D on 11/07/2017 at 1:05 PM  Between 7am to 6pm - Pager - [phone]  After 6pm go to [email or web address] - password [email or web address]  Office  [phone]  CC: Primary care physician; Birdie Sons, MD

## 2017-11-07 NOTE — Plan of Care (Signed)
  Progressing Education: Knowledge of General Education information will improve 11/07/2017 0750 - Progressing by Rowe Robert, RN Health Behavior/Discharge Planning: Ability to manage health-related needs will improve 11/07/2017 0750 - Progressing by Rowe Robert, RN Clinical Measurements: Ability to maintain clinical measurements within normal limits will improve 11/07/2017 0750 - Progressing by Rowe Robert, RN Will remain free from infection 11/07/2017 0750 - Progressing by Rowe Robert, RN Diagnostic test results will improve 11/07/2017 0750 - Progressing by Rowe Robert, RN Respiratory complications will improve 11/07/2017 0750 - Progressing by Rowe Robert, RN Cardiovascular complication will be avoided 11/07/2017 0750 - Progressing by Rowe Robert, RN Activity: Risk for activity intolerance will decrease 11/07/2017 0750 - Progressing by Rowe Robert, RN Nutrition: Adequate nutrition will be maintained 11/07/2017 0750 - Progressing by Rowe Robert, RN Coping: Level of anxiety will decrease 11/07/2017 0750 - Progressing by Rowe Robert, RN Elimination: Will not experience complications related to bowel motility 11/07/2017 0750 - Progressing by Rowe Robert, RN Will not experience complications related to urinary retention 11/07/2017 0750 - Progressing by Rowe Robert, RN Pain Managment: General experience of comfort will improve 11/07/2017 0750 - Progressing by Rowe Robert, RN Safety: Ability to remain free from injury will improve 11/07/2017 0750 - Progressing by Rowe Robert, RN Skin Integrity: Risk for impaired skin integrity will decrease 11/07/2017 0750 - Progressing by Rowe Robert, RN

## 2017-11-08 LAB — BASIC METABOLIC PANEL
Anion gap: 5 (ref 5–15)
BUN: 14 mg/dL (ref 6–20)
CO2: 26 mmol/L (ref 22–32)
Calcium: 8.2 mg/dL — ABNORMAL LOW (ref 8.9–10.3)
Chloride: 101 mmol/L (ref 101–111)
Creatinine, Ser: 0.92 mg/dL (ref 0.61–1.24)
GFR calc Af Amer: >60 mL/min (ref >60)
GFR calc non Af Amer: >60 mL/min (ref >60)
Glucose, Bld: 115 mg/dL — ABNORMAL HIGH (ref 65–99)
Potassium: 3.9 mmol/L (ref 3.5–5.1)
Sodium: 132 mmol/L — ABNORMAL LOW (ref 135–145)

## 2017-11-08 LAB — CBC
HCT: 36.2 % — ABNORMAL LOW (ref 40.0–52.0)
Hemoglobin: 12.1 g/dL — ABNORMAL LOW (ref 13.0–18.0)
MCH: 28.9 pg (ref 26.0–34.0)
MCHC: 33.5 g/dL (ref 32.0–36.0)
MCV: 86.2 fL (ref 80.0–100.0)
Platelets: 141 10*3/uL — ABNORMAL LOW (ref 150–440)
RBC: 4.2 MIL/uL — ABNORMAL LOW (ref 4.40–5.90)
RDW: 15.2 % — ABNORMAL HIGH (ref 11.5–14.5)
WBC: 5.3 10*3/uL (ref 3.8–10.6)

## 2017-11-08 LAB — HIV ANTIBODY (ROUTINE TESTING W REFLEX): HIV Screen 4th Generation wRfx: NONREACTIVE

## 2017-11-08 MED ORDER — LEVOFLOXACIN 750 MG PO TABS
750.0000 mg | ORAL_TABLET | Freq: Once | ORAL | Status: AC
  Administered 2017-11-08: 11:00:00 750 mg via ORAL
  Filled 2017-11-08: qty 1

## 2017-11-08 MED ORDER — LEVOFLOXACIN 750 MG PO TABS: 750.0000 mg | ORAL_TABLET | Freq: Every day | ORAL | 0 refills | Status: DC

## 2017-11-08 NOTE — Discharge Summary (Signed)
[facility] NAME: James [name]    MR#:  [id number]  DATE OF BIRTH:  1929/07/20  DATE OF ADMISSION:  11/06/2017 ADMITTING PHYSICIAN: Gorden Harms, MD  DATE OF DISCHARGE: 11/08/2017  PRIMARY CARE PHYSICIAN: Birdie Sons, MD    ADMISSION DIAGNOSIS:  Hyponatremia [E87.1] Healthcare-associated pneumonia [J18.9]  DISCHARGE DIAGNOSIS:  Active Problems:   HCAP  SECONDARY DIAGNOSIS:   Past Medical History:  Diagnosis Date  . Arthritis   . Back pain   . CAD (coronary artery disease)   . Cataract    right  . COPD (chronic obstructive pulmonary disease) ([place])    SPiriva and SYmbicort daily. Albuterol as needed  . Diverticulosis   . Dyspnea    with exertion  . Enlarged prostate    takes Flomax daily  . GERD (gastroesophageal reflux disease)   . History of chicken pox   . History of gout   . History of measles   . History of mumps   . HOH (hard of hearing)   . Hyperglycemia   . Hyperlipidemia    takes Atorvastatin daily  . Hypertension    takes Metoprolol daily as well as Lotensin HCT  . Hypotension   . Joint pain   . Microscopic colitis   . PAF (paroxysmal atrial fibrillation) ([place]), RVR 03/14/2017  . Prostate cancer ([place])   . TIA (transient ischemic attack)   . Weakness    numbness and tingling.mainly on right    HOSPITAL COURSE:    83 year old male with multiple medical problems including CAD, arthritis, COPD not on home oxygen, hypertension, hyperlipidemia and recent admission for stroke and was discharged on eliquis due to history of paroxysmal atrial fibrillation last week comments with worsening cough and hemoptysis.  1. Acute hypoxic respiratory failure in the setting of right-sided pneumonia: Patient is doing well. Patient reports no fever, cough or shortness of breath. Oxygen was weaned.   2. HCAP: Patient was on cefepime and will be discharged on Levaquin for 5 days.   3. Hemoptysis: This is due  to underlying pneumonia and lower lobe pulmonary mass. Hemoglobin remained stable.  4. Recent CVA: Patient will continue on Eliquis.   5. Right lung mass: Patient follows up with Dr.Ramachandran and has a repeat CT scheduled in 3 months.  6. Essential hypertension: Patient will continue metoprolol, Benzapril, HCTZ  7. Hyponatremia: This improved with IV fluids.  8. BPH: Continue tamsulosin   DISCHARGE CONDITIONS AND DIET:   Stable for discharge on heart healthy diet  CONSULTS OBTAINED:    DRUG ALLERGIES:   Allergies  Allergen Reactions  . Indomethacin Nausea Only    DISCHARGE MEDICATIONS:   Current Discharge Medication List    START taking these medications   Details  levofloxacin (LEVAQUIN) 750 MG tablet Take 1 tablet (750 mg total) by mouth daily. Qty: 5 tablet, Refills: 0      CONTINUE these medications which have NOT CHANGED   Details  apixaban (ELIQUIS) 5 MG TABS tablet Take 1 tablet (5 mg total) 2 (two) times daily by mouth. Start on 11/20 affter stopping PLAVIX and take this with baby aspirin Qty: 60 tablet, Refills: 0    atorvastatin (LIPITOR) 80 MG tablet TAKE 1 TABLET BY MOUTH DAILY Qty: 90 tablet, Refills: 4    azelastine (OPTIVAR) 0.05 % ophthalmic solution Apply 1 drop to eye 2 (two) times daily. Qty: 6 mL, Refills: 5    benazepril-hydrochlorthiazide (LOTENSIN HCT) 20-12.5 MG tablet  Take 0.5 tablets by mouth daily. Qty: 3 tablet, Refills: 0    beta carotene w/minerals (OCUVITE) tablet Take 1 tablet by mouth daily.    clopidogrel (PLAVIX) 75 MG tablet Take 75 mg by mouth daily.    fluticasone (FLONASE) 50 MCG/ACT nasal spray Place 2 sprays into both nostrils daily. Qty: 48 g, Refills: 5    lansoprazole (PREVACID) 30 MG capsule TAKE 1 CAPSULE BY MOUTH EVERY DAY Qty: 30 capsule, Refills: 12    Magnesium 400 MG CAPS Take 1 capsule by mouth daily.     metoprolol (LOPRESSOR) 50 MG tablet Take 1 tablet (50 mg total) by mouth 2 (two) times  daily. Qty: 180 tablet, Refills: 4    montelukast (SINGULAIR) 10 MG tablet TAKE ONE TABLET BY MOUTH EVERY DAY Qty: 30 tablet, Refills: 12    MULTIPLE VITAMIN PO Take 1 tablet by mouth daily.     Omega-3 Fatty Acids (FISH OIL) 1000 MG CAPS Take 1 capsule daily by mouth.    SPIRIVA HANDIHALER 18 MCG inhalation capsule INHALE 1 CAPSULE AS DIRECTED ONCE A DAY Qty: 30 capsule, Refills: 12   Associated Diagnoses: Shortness of breath    SYMBICORT 160-4.5 MCG/ACT inhaler INHALE 2 PUFFS INTO THE LUNGS TWICE DAILY Qty: 10.2 g, Refills: 12    tamsulosin (FLOMAX) 0.4 MG CAPS capsule Take 1 capsule (0.4 mg total) by mouth daily. Qty: 90 capsule, Refills: 4    tiZANidine (ZANAFLEX) 4 MG tablet TAKE ONE TABLET AT BEDTIME Qty: 30 tablet, Refills: 12   Associated Diagnoses: Cervical neck pain with evidence of disc disease    docusate sodium (COLACE) 100 MG capsule Take 1 capsule (100 mg total) by mouth 2 (two) times daily. Qty: 60 capsule, Refills: 0    HYDROcodone-acetaminophen (NORCO) 10-325 MG tablet Take 1 tablet by mouth every 6 (six) hours as needed. Qty: 30 tablet, Refills: 0   Associated Diagnoses: Neck pain    LORazepam (ATIVAN) 1 MG tablet TAKE 1 TABLET AT BEDTIME AS NEEDED FOR Qty: 30 tablet, Refills: 5    PROAIR HFA 108 (90 Base) MCG/ACT inhaler INHALE 2 PUFFS INTO LUNGS EVERY 6 HOURS AS NEEDED FOR WHEEZING OR SHORTNESS OF BREATH Qty: 8.5 g, Refills: 5      STOP taking these medications     aspirin (ASPIRIN CHILDRENS) 81 MG chewable tablet           Today   CHIEF COMPLAINT:   Doing well this morning. Would like to go home.   VITAL SIGNS:  Blood pressure (!) 154/94, pulse 85, temperature 98.1 F (36.7 C), temperature source Oral, resp. rate 17, height 5\' 8"  (1.727 m), weight 83.1 kg (183 lb 4.8 oz), SpO2 94 %.   REVIEW OF SYSTEMS:  Review of Systems  Constitutional: Negative.  Negative for chills, fever and malaise/fatigue.  HENT: Negative.  Negative for ear  discharge, ear pain, hearing loss, nosebleeds and sore throat.   Eyes: Negative.  Negative for blurred vision and pain.  Respiratory: Negative.  Negative for cough, hemoptysis, shortness of breath and wheezing.   Cardiovascular: Negative.  Negative for chest pain, palpitations and leg swelling.  Gastrointestinal: Negative.  Negative for abdominal pain, blood in stool, diarrhea, nausea and vomiting.  Genitourinary: Negative.  Negative for dysuria.  Musculoskeletal: Negative.  Negative for back pain.  Skin: Negative.   Neurological: Negative for dizziness, tremors, speech change, focal weakness, seizures and headaches.  Endo/Heme/Allergies: Negative.  Does not bruise/bleed easily.  Psychiatric/Behavioral: Negative.  Negative for depression, hallucinations and  suicidal ideas.     PHYSICAL EXAMINATION:  GENERAL:  83 y.o.-year-old patient lying in the bed with no acute distress.  NECK:  Supple, no jugular venous distention. No thyroid enlargement, no tenderness.  LUNGS: Normal breath sounds bilaterally, no wheezing, rales,rhonchi  No use of accessory muscles of respiration.  CARDIOVASCULAR: S1, S2 normal. No murmurs, rubs, or gallops.  ABDOMEN: Soft, non-tender, non-distended. Bowel sounds present. No organomegaly or mass.  EXTREMITIES: No pedal edema, cyanosis, or clubbing.  PSYCHIATRIC: The patient is alert and oriented x 3.  SKIN: No obvious rash, lesion, or ulcer.   DATA REVIEW:   CBC Recent Labs  Lab 11/08/17 0434  WBC 5.3  HGB 12.1*  HCT 36.2*  PLT 141*    Chemistries  Recent Labs  Lab 11/06/17 1123  11/08/17 0434  NA 127*   < > 132*  K 4.5   < > 3.9  CL 94*   < > 101  CO2 22   < > 26  GLUCOSE 120*   < > 115*  BUN 16   < > 14  CREATININE 1.00   < > 0.92  CALCIUM 8.9   < > 8.2*  AST 30  --   --   ALT 28  --   --   ALKPHOS 80  --   --   BILITOT 0.9  --   --    < > = values in this interval not displayed.    Cardiac Enzymes Recent Labs  Lab 11/06/17 1123   TROPONINI <0.03    Microbiology Results  @[email or web address] @  RADIOLOGY:  Dg Chest 2 View  Result Date: 11/06/2017 CLINICAL DATA:  83 year old male with increase shortness of breath for 2 days. Pneumonia status post course of antibiotics. Productive cough with blood-tinged sputum. EXAM: CHEST  2 VIEW COMPARISON:  10/30/2017 and earlier. FINDINGS: Recurrent patchy and coarse opacity throughout the right lung appearing similar to the 10/16/2017 radiographs. This was resolved on the intervening 10/30/2017 study. Underlying chronic elevation of the right hemidiaphragm and chronic right lung base atelectasis. No pneumothorax. No definite pleural effusion. The left lung is stable in clear. Stable cardiac size and mediastinal contours. Prior CABG. Negative visible bowel gas pattern. Calcified aortic atherosclerosis. Stable visualized osseous structures. IMPRESSION: 1. Recurrent patchy and coarse opacity throughout the mid right lung similar to the 10/16/2017 exam, although was resolved on the intervening 10/30/2017 exam. Consider recurrent bronchopneumonia versus recurrent pulmonary hemorrhage versus recurrent noninfectious inflammation such as vasculitis. 2. Chronic elevation of the right hemidiaphragm with superimposed right lung base atelectasis. 3. The left lung remains clear. 4.  Calcified aortic atherosclerosis. Electronically Signed   By: Genevie Ann M.D.   On: 11/06/2017 11:53   Ct Angio Chest Pe W And/or Wo Contrast  Result Date: 11/06/2017 CLINICAL DATA:  Shortness of breath for 2 days. Recently treated for pneumonia. Cough with blood-tinged sputum. EXAM: CT ANGIOGRAPHY CHEST WITH CONTRAST TECHNIQUE: Multidetector CT imaging of the chest was performed using the standard protocol during bolus administration of intravenous contrast. Multiplanar CT image reconstructions and MIPs were obtained to evaluate the vascular anatomy. CONTRAST:  3mL ISOVUE-370 IOPAMIDOL (ISOVUE-370) INJECTION 76% COMPARISON:  Chest  radiographs 11/06/2017 and earlier. PET-CT 09/25/2017. Chest CT 09/17/2017. FINDINGS: Cardiovascular: Pulmonary arterial opacification is adequate without evidence of emboli. Sequelae of prior CABG are again identified. There is aortic and extensive coronary artery atherosclerosis. The heart is borderline enlarged. There is no pericardial effusion. Mediastinum/Nodes: No enlarged axillary, mediastinal, or hilar lymph nodes. Small  sliding hiatal hernia. Unremarkable thyroid. Lungs/Pleura: No pleural effusion or pneumothorax. Chronic elevation of the right hemidiaphragm with right middle and lower lobe atelectasis. Patchy consolidation and peribronchovascular nodularity throughout the right lung with some air bronchograms, increased from the prior chest CT. As noted on today's earlier chest radiographs, right lung airspace disease had resolved on interval 10/30/2017 radiographs. Masslike consolidation in the posterior right costophrenic sulcus has increased from the prior PET-CT, measuring 6.6 x 2.2 cm. There is no left lung consolidation. Upper Abdomen: No acute abnormality. Musculoskeletal: Chronic T4 and T10 vertebral body fractures. No suspicious osseous lesion. Review of the MIP images confirms the above findings. IMPRESSION: 1. No evidence of pulmonary emboli. 2. Patchy consolidation throughout the right lung concerning for recurrent pneumonia. 3. Mildly increased size of masslike consolidation in the right lung base, previously evaluated with PET-CT. Some of this apparent enlargement could be secondary to surrounding superimposed acute consolidation. Follow-up chest CT is recommended in 2-3 months for reassessment after the acute process has resolved. 4. Aortic Atherosclerosis (ICD10-I70.0). Electronically Signed   By: Logan Bores M.D.   On: 11/06/2017 13:11      Current Discharge Medication List    START taking these medications   Details  levofloxacin (LEVAQUIN) 750 MG tablet Take 1 tablet (750 mg  total) by mouth daily. Qty: 5 tablet, Refills: 0      CONTINUE these medications which have NOT CHANGED   Details  apixaban (ELIQUIS) 5 MG TABS tablet Take 1 tablet (5 mg total) 2 (two) times daily by mouth. Start on 11/20 affter stopping PLAVIX and take this with baby aspirin Qty: 60 tablet, Refills: 0    atorvastatin (LIPITOR) 80 MG tablet TAKE 1 TABLET BY MOUTH DAILY Qty: 90 tablet, Refills: 4    azelastine (OPTIVAR) 0.05 % ophthalmic solution Apply 1 drop to eye 2 (two) times daily. Qty: 6 mL, Refills: 5    benazepril-hydrochlorthiazide (LOTENSIN HCT) 20-12.5 MG tablet Take 0.5 tablets by mouth daily. Qty: 3 tablet, Refills: 0    beta carotene w/minerals (OCUVITE) tablet Take 1 tablet by mouth daily.    clopidogrel (PLAVIX) 75 MG tablet Take 75 mg by mouth daily.    fluticasone (FLONASE) 50 MCG/ACT nasal spray Place 2 sprays into both nostrils daily. Qty: 48 g, Refills: 5    lansoprazole (PREVACID) 30 MG capsule TAKE 1 CAPSULE BY MOUTH EVERY DAY Qty: 30 capsule, Refills: 12    Magnesium 400 MG CAPS Take 1 capsule by mouth daily.     metoprolol (LOPRESSOR) 50 MG tablet Take 1 tablet (50 mg total) by mouth 2 (two) times daily. Qty: 180 tablet, Refills: 4    montelukast (SINGULAIR) 10 MG tablet TAKE ONE TABLET BY MOUTH EVERY DAY Qty: 30 tablet, Refills: 12    MULTIPLE VITAMIN PO Take 1 tablet by mouth daily.     Omega-3 Fatty Acids (FISH OIL) 1000 MG CAPS Take 1 capsule daily by mouth.    SPIRIVA HANDIHALER 18 MCG inhalation capsule INHALE 1 CAPSULE AS DIRECTED ONCE A DAY Qty: 30 capsule, Refills: 12   Associated Diagnoses: Shortness of breath    SYMBICORT 160-4.5 MCG/ACT inhaler INHALE 2 PUFFS INTO THE LUNGS TWICE DAILY Qty: 10.2 g, Refills: 12    tamsulosin (FLOMAX) 0.4 MG CAPS capsule Take 1 capsule (0.4 mg total) by mouth daily. Qty: 90 capsule, Refills: 4    tiZANidine (ZANAFLEX) 4 MG tablet TAKE ONE TABLET AT BEDTIME Qty: 30 tablet, Refills: 12   Associated  Diagnoses: Cervical neck  pain with evidence of disc disease    docusate sodium (COLACE) 100 MG capsule Take 1 capsule (100 mg total) by mouth 2 (two) times daily. Qty: 60 capsule, Refills: 0    HYDROcodone-acetaminophen (NORCO) 10-325 MG tablet Take 1 tablet by mouth every 6 (six) hours as needed. Qty: 30 tablet, Refills: 0   Associated Diagnoses: Neck pain    LORazepam (ATIVAN) 1 MG tablet TAKE 1 TABLET AT BEDTIME AS NEEDED FOR Qty: 30 tablet, Refills: 5    PROAIR HFA 108 (90 Base) MCG/ACT inhaler INHALE 2 PUFFS INTO LUNGS EVERY 6 HOURS AS NEEDED FOR WHEEZING OR SHORTNESS OF BREATH Qty: 8.5 g, Refills: 5      STOP taking these medications     aspirin (ASPIRIN CHILDRENS) 81 MG chewable tablet            Management plans discussed with the patient and he is in agreement. Stable for discharge   Patient should follow up with pcp  CODE STATUS:     Code Status Orders  (From admission, onward)        Start     Ordered   11/06/17 1809  Full code  Continuous     11/06/17 1808    Code Status History    Date Active Date Inactive Code Status Order ID Comments User Context   10/30/2017 11:34 10/31/2017 18:01 Full Code [id number]  Demetrios Loll, MD Inpatient   03/13/2017 18:20 03/14/2017 20:51 Full Code [id number]  Newman Pies, MD Inpatient   03/07/2017 18:49 03/08/2017 16:42 Full Code [id number]  Newman Pies, MD Inpatient    Advance Directive Documentation     Most Recent Value  Type of Advance Directive  Living will, Healthcare Power of Attorney  Pre-existing out of facility DNR order (yellow form or pink MOST form)  No data  "MOST" Form in Place?  No data      TOTAL TIME TAKING CARE OF THIS PATIENT: 38 minutes.    Note: This dictation was prepared with Dragon dictation along with smaller phrase technology. Any transcriptional errors that result from this process are unintentional.  Ulice Follett M.D on 11/08/2017 at 8:53 AM  Between 7am to 6pm - Pager -  [phone] After 6pm go to [email or web address] - password [email or web address] [facility]  Office  [phone]  CC: Primary care physician; Birdie Sons, MD

## 2017-11-08 NOTE — Progress Notes (Signed)
Received MD order to discharge patient to home, reviewed home meds, prescriptions and discharge instructions with patient and wife and both verbalized understanding

## 2017-11-11 LAB — CULTURE, BLOOD (ROUTINE X 2)
Culture: NO GROWTH
Culture: NO GROWTH
Special Requests: ADEQUATE

## 2017-11-13 ENCOUNTER — Other Ambulatory Visit: Payer: Self-pay | Admitting: Internal Medicine

## 2017-11-13 ENCOUNTER — Other Ambulatory Visit: Payer: Self-pay | Admitting: Family Medicine

## 2017-11-13 DIAGNOSIS — J189 Pneumonia, unspecified organism: Secondary | ICD-10-CM

## 2017-11-14 ENCOUNTER — Ambulatory Visit: Payer: Medicare Other | Admitting: Family Medicine

## 2017-11-14 ENCOUNTER — Encounter: Payer: Self-pay | Admitting: Family Medicine

## 2017-11-14 VITALS — BP 112/62 | HR 65 | Temp 98.5°F | Resp 18 | Wt 185.0 lb

## 2017-11-14 DIAGNOSIS — J181 Lobar pneumonia, unspecified organism: Secondary | ICD-10-CM

## 2017-11-14 DIAGNOSIS — I6930 Unspecified sequelae of cerebral infarction: Secondary | ICD-10-CM

## 2017-11-14 DIAGNOSIS — J189 Pneumonia, unspecified organism: Secondary | ICD-10-CM

## 2017-11-14 NOTE — Progress Notes (Signed)
[facility] Consultation      Assessment and Plan:  The patient has chronic progressive dyspnea, which is thought to be multifactorial due to COPD, systolic congestive heart failure, elevated hemidiaphragm. Now following up because of a lung nodule.  Lung nodule.  --RLL nodule/mass.  Patient was sent for a PET scan 09/25/17 which was mildly positive; case was discussed at tumor board subsequently, it was recommended that he have a repeat CT scan in approximately 3 months.  Patient some sleep presented to the hospital with recurrent pneumonia and hemoptysis in November 2018, repeat CT scan at that time showed possible slightly increased size in the mass. -Had a long discussion with the patient and his wife regarding the possibility of cancer versus inflammatory mass.  Patient is developing recurrent hemoptysis and pneumonia on 3 different occasions, while on Eliquis.  We would likely lean towards invasive procedure to diagnose this at this time. - Additionally however the patient has had stroke when being off of anticoagulation for 24 hours therefore presents an elevated risk.  However given his recurrent episodes of hemoptysis, which is now recurring recurrent pneumonia we have decided that we will proceed with bronchoscopy with brushings/washings but without forceps to try to reduce bleeding risk.   COPD.  --Continue spiriva, symbicort.  --s/p Prevnar-13 vaccine.  Deconditioning/Debility.  --Continue activity, can consider referral to pulm rehab in future.   Elevated right diaphragm.  --Present for many years, likely contributing to dyspnea.   Chronic systolic cardiac dysfunction.  --Stable EF of 40%.   Dyspnea. --Dyspnea is likely multifactorial due to all of the above issues.  --Continue current inhalers.    Date: 11/14/2017  MRN# [id number] James [name] April 17, 1929   James [name] is a 83 y.o. old male seen in consultation for chief complaint of:    Chief  Complaint  Patient presents with  . Hospitalization Follow-up    had PNA; spitting up old blood in mucus: prod cough SOB at all times:    HPI:   The patient is a 83 year old male with chronic progressive dyspnea, which is thought to be multifactorial due to COPD, systolic congestive heart failure, elevated hemidiaphragm. He was recently admitted to the hospital for pneumonia, he has been following her because abnormal lung nodules. He also has increased left sided weakness after a stroke about 2 weeks ago.   Last visit he was asked to continue Spiriva, Symbicort, increased physical activity level, and using albuterol MDI with exercise. He was sent for a PET scan due to enlarging lung nodule.   When he went to the hospital he was coughing up blood, therefore his eliquis was stopped. He was discharged with it, and he notes that he has started coughing up blood again. He was on plavix, it was stopped, and a day later he was changed to eliquis, it was at that time that he had the stroke.   CT chest on 03/13/17; this showed chronic interstitial changes, a 3 cm mass-like opacity on posterior right costophrenic sulcus. He was recently sent for a repeat CT on 09/17/17. On my personal review, there is RLL atelectasis with elevated right diaphragm. There is mass in the RLL posteriorly, this appears to be slightly larger than previous scan in March 2018.   He has cats and dogs at home, seldom in the bedroom.  Review of Dr. Bethanne Ginger records: He is noted to have controlled Afib, most recent echocardiogram showed stable systolic function of about 40%.   **CXR  05/22/16; elevated right diaphragm, stable for several years.   **Spirometry; 09/28/16; restriction with FEV1=48%  Medication:    Reviewed.     Allergies:  Indomethacin  Review of Systems: Gen:  Denies  fever, sweats, chills HEENT: Denies blurred vision, double vision. bleeds, sore throat Cvc:  No dizziness, chest pain. Resp:   NOTES recurring  hemoptysis.  Gi: Denies swallowing difficulty, stomach pain. Gu:  Denies bladder incontinence, burning urine Ext:   No Joint pain, stiffness. Skin: No skin rash,  hives  Endoc:  No polyuria, polydipsia. Psych: No depression, insomnia. Other:  All other systems were reviewed with the patient and were negative other that what is mentioned in the HPI.   Physical Examination:   VS: BP 118/68 (BP Location: Left Arm, Cuff Size: Normal)   Pulse (!) 54   SpO2 96%   General Appearance: No distress  Neuro:without focal findings,  speech normal,  HEENT: PERRLA, EOM intact.   Pulmonary: normal breath sounds, No wheezing. Decreased air entry bilaterally.   CardiovascularNormal S1,S2.  No m/r/g.   Abdomen: Benign, Soft, non-tender. Renal:  No costovertebral tenderness  GU:  No performed at this time. Endoc: No evident thyromegaly, no signs of acromegaly. Skin:   warm, no rashes, no ecchymosis  Extremities: normal, no cyanosis, clubbing.  Other findings:    LABORATORY PANEL:   CBC Recent Labs  Lab 11/08/17 0434  WBC 5.3  HGB 12.1*  HCT 36.2*  PLT 141*   ------------------------------------------------------------------------------------------------------------------  Chemistries  Recent Labs  Lab 11/08/17 0434  NA 132*  K 3.9  CL 101  CO2 26  GLUCOSE 115*  BUN 14  CREATININE 0.92  CALCIUM 8.2*   ------------------------------------------------------------------------------------------------------------------  Cardiac Enzymes No results for input(s): TROPONINI in the last 168 hours. ------------------------------------------------------------  RADIOLOGY:  No results found.     Thank  you for the consultation and for allowing [facility] to assist in the care of your patient. Our recommendations are noted above.  Please contact us if we can be of further service.   Marda Stalker, MD.  Board Certified in Internal Medicine, Pulmonary  Medicine, [place], and Sleep Medicine.  [facility] Office Number: [phone]  Patricia Pesa, M.D.  Merton Border, M.D  11/14/2017

## 2017-11-14 NOTE — H&P (View-Only) (Signed)
[place] Consultation      Assessment and Plan:  The patient has chronic progressive dyspnea, which is thought to be multifactorial due to COPD, systolic congestive heart failure, elevated hemidiaphragm. Now following up because of a lung nodule.  Lung nodule.  --RLL nodule/mass.  Patient was sent for a PET scan 09/25/17 which was mildly positive; case was discussed at tumor board subsequently, it was recommended that he have a repeat CT scan in approximately 3 months.  Patient some sleep presented to the hospital with recurrent pneumonia and hemoptysis in November 2018, repeat CT scan at that time showed possible slightly increased size in the mass. -Had a long discussion with the patient and his wife regarding the possibility of cancer versus inflammatory mass.  Patient is developing recurrent hemoptysis and pneumonia on 3 different occasions, while on Eliquis.  We would likely lean towards invasive procedure to diagnose this at this time. - Additionally however the patient has had stroke when being off of anticoagulation for 24 hours therefore presents an elevated risk.  However given his recurrent episodes of hemoptysis, which is now recurring recurrent pneumonia we have decided that we will proceed with bronchoscopy with brushings/washings but without forceps to try to reduce bleeding risk.   COPD.  --Continue spiriva, symbicort.  --s/p Prevnar-13 vaccine.  Deconditioning/Debility.  --Continue activity, can consider referral to pulm rehab in future.   Elevated right diaphragm.  --Present for many years, likely contributing to dyspnea.   Chronic systolic cardiac dysfunction.  --Stable EF of 40%.   Dyspnea. --Dyspnea is likely multifactorial due to all of the above issues.  --Continue current inhalers.    Date: 11/14/2017  MRN# [id number] James [name] 09-May-1929   James [name] is a 83 y.o. old male seen in consultation for chief complaint of:    Chief  Complaint  Patient presents with  . Hospitalization Follow-up    had PNA; spitting up old blood in mucus: prod cough SOB at all times:    HPI:   The patient is a 83 year old male with chronic progressive dyspnea, which is thought to be multifactorial due to COPD, systolic congestive heart failure, elevated hemidiaphragm. He was recently admitted to the hospital for pneumonia, he has been following her because abnormal lung nodules. He also has increased left sided weakness after a stroke about 2 weeks ago.   Last visit he was asked to continue Spiriva, Symbicort, increased physical activity level, and using albuterol MDI with exercise. He was sent for a PET scan due to enlarging lung nodule.   When he went to the hospital he was coughing up blood, therefore his eliquis was stopped. He was discharged with it, and he notes that he has started coughing up blood again. He was on plavix, it was stopped, and a day later he was changed to eliquis, it was at that time that he had the stroke.   CT chest on 03/13/17; this showed chronic interstitial changes, a 3 cm mass-like opacity on posterior right costophrenic sulcus. He was recently sent for a repeat CT on 09/17/17. On my personal review, there is RLL atelectasis with elevated right diaphragm. There is mass in the RLL posteriorly, this appears to be slightly larger than previous scan in March 2018.   He has cats and dogs at home, seldom in the bedroom.  Review of Dr. Bethanne Ginger records: He is noted to have controlled Afib, most recent echocardiogram showed stable systolic function of about 40%.   **CXR  05/22/16; elevated right diaphragm, stable for several years.   **Spirometry; 09/28/16; restriction with FEV1=48%  Medication:    Reviewed.     Allergies:  Indomethacin  Review of Systems: Gen:  Denies  fever, sweats, chills HEENT: Denies blurred vision, double vision. bleeds, sore throat Cvc:  No dizziness, chest pain. Resp:   NOTES recurring  hemoptysis.  Gi: Denies swallowing difficulty, stomach pain. Gu:  Denies bladder incontinence, burning urine Ext:   No Joint pain, stiffness. Skin: No skin rash,  hives  Endoc:  No polyuria, polydipsia. Psych: No depression, insomnia. Other:  All other systems were reviewed with the patient and were negative other that what is mentioned in the HPI.   Physical Examination:   VS: BP 118/68 (BP Location: Left Arm, Cuff Size: Normal)   Pulse (!) 54   SpO2 96%   General Appearance: No distress  Neuro:without focal findings,  speech normal,  HEENT: PERRLA, EOM intact.   Pulmonary: normal breath sounds, No wheezing. Decreased air entry bilaterally.   CardiovascularNormal S1,S2.  No m/r/g.   Abdomen: Benign, Soft, non-tender. Renal:  No costovertebral tenderness  GU:  No performed at this time. Endoc: No evident thyromegaly, no signs of acromegaly. Skin:   warm, no rashes, no ecchymosis  Extremities: normal, no cyanosis, clubbing.  Other findings:    LABORATORY PANEL:   CBC Recent Labs  Lab 11/08/17 0434  WBC 5.3  HGB 12.1*  HCT 36.2*  PLT 141*   ------------------------------------------------------------------------------------------------------------------  Chemistries  Recent Labs  Lab 11/08/17 0434  NA 132*  K 3.9  CL 101  CO2 26  GLUCOSE 115*  BUN 14  CREATININE 0.92  CALCIUM 8.2*   ------------------------------------------------------------------------------------------------------------------  Cardiac Enzymes No results for input(s): TROPONINI in the last 168 hours. ------------------------------------------------------------  RADIOLOGY:  No results found.     Thank  you for the consultation and for allowing [facility] to assist in the care of your patient. Our recommendations are noted above.  Please contact us if we can be of further service.   Marda Stalker, MD.  Board Certified in Internal Medicine, Pulmonary  Medicine, [place], and Sleep Medicine.  [facility] Office Number: [phone]  Patricia Pesa, M.D.  Merton Border, M.D  11/14/2017

## 2017-11-14 NOTE — Progress Notes (Signed)
Patient: James [name] Male    DOB: 09/15/1929   83 y.o.   MRN: [id number] Visit Date: 11/14/2017  Today's Provider: Lelon Huh, MD   Chief Complaint  Patient presents with  . Hospitalization Follow-up   Subjective:    HPI   Follow up Hospitalization  Patient was admitted to [facility] on 11/06/2017 and discharged on 11/22/201/8. He was treated for healthcare acquired pneumonia and hyponatremia. Treatment for this included; patient was on cefepime and was discharged on Levaquin for 5 days. Was also hospitalized 11/13-11/14 for CVA which presented as subtle  dysarthria and LUE weakness and numbness. Still has a bit of numbness in left arm He was also put on Eliquis for PAF  Patient was to contiune on eliquis for treatment of recent CVA (hospitalized for this problem 10/30/17). Hyponatremia improved with IV fluids. He reports good compliance with treatment. He reports this condition is Improved. Patient states he gets short of breath during exertion.   He has follow up with Dr. Ashby Dawes tomorrow and Dr. Ubaldo Glassing on 02/06/2018 He finished Levaquin yesterday.  Still coughing.  ------------------------------------------------------------------------------------     Allergies  Allergen Reactions  . Indomethacin Nausea Only     Current Outpatient Medications:  .  apixaban (ELIQUIS) 5 MG TABS tablet, Take 1 tablet (5 mg total) 2 (two) times daily by mouth. Start on 11/20 affter stopping PLAVIX and take this with baby aspirin, Disp: 60 tablet, Rfl: 0 .  atorvastatin (LIPITOR) 80 MG tablet, TAKE 1 TABLET BY MOUTH DAILY, Disp: 90 tablet, Rfl: 4 .  azelastine (OPTIVAR) 0.05 % ophthalmic solution, Apply 1 drop to eye 2 (two) times daily., Disp: 6 mL, Rfl: 5 .  benazepril-hydrochlorthiazide (LOTENSIN HCT) 20-12.5 MG tablet, Take 0.5 tablets by mouth daily., Disp: 3 tablet, Rfl: 0 .  beta carotene w/minerals (OCUVITE) tablet, Take 1 tablet by mouth daily., Disp: , Rfl:  .  clopidogrel  (PLAVIX) 75 MG tablet, Take 75 mg by mouth daily., Disp: , Rfl:  .  docusate sodium (COLACE) 100 MG capsule, Take 1 capsule (100 mg total) by mouth 2 (two) times daily. (Patient taking differently: Take 100 mg 2 (two) times daily as needed by mouth for mild constipation. ), Disp: 60 capsule, Rfl: 0 .  fluticasone (FLONASE) 50 MCG/ACT nasal spray, Place 2 sprays into both nostrils daily. (Patient taking differently: Place 2 sprays into both nostrils every evening. ), Disp: 48 g, Rfl: 5 .  HYDROcodone-acetaminophen (NORCO) 10-325 MG tablet, Take 1 tablet by mouth every 6 (six) hours as needed., Disp: 30 tablet, Rfl: 0 .  lansoprazole (PREVACID) 30 MG capsule, TAKE 1 CAPSULE BY MOUTH EVERY DAY, Disp: 30 capsule, Rfl: 12 .  levofloxacin (LEVAQUIN) 750 MG tablet, Take 1 tablet (750 mg total) by mouth daily., Disp: 5 tablet, Rfl: 0 .  LORazepam (ATIVAN) 1 MG tablet, TAKE 1 TABLET AT BEDTIME AS NEEDED FOR, Disp: 30 tablet, Rfl: 5 .  Magnesium 400 MG CAPS, Take 1 capsule by mouth daily. , Disp: , Rfl:  .  metoprolol tartrate (LOPRESSOR) 50 MG tablet, TAKE ONE TABLET BY MOUTH TWICE DAILY, Disp: 180 tablet, Rfl: 4 .  montelukast (SINGULAIR) 10 MG tablet, TAKE ONE TABLET BY MOUTH EVERY DAY, Disp: 30 tablet, Rfl: 12 .  MULTIPLE VITAMIN PO, Take 1 tablet by mouth daily. , Disp: , Rfl:  .  Omega-3 Fatty Acids (FISH OIL) 1000 MG CAPS, Take 1 capsule daily by mouth., Disp: , Rfl:  .  PROAIR HFA  108 (90 Base) MCG/ACT inhaler, INHALE 2 PUFFS INTO LUNGS EVERY 6 HOURS AS NEEDED FOR WHEEZING OR SHORTNESS OF BREATH, Disp: 8.5 g, Rfl: 5 .  SPIRIVA HANDIHALER 18 MCG inhalation capsule, INHALE 1 CAPSULE AS DIRECTED ONCE A DAY, Disp: 30 capsule, Rfl: 12 .  SYMBICORT 160-4.5 MCG/ACT inhaler, INHALE 2 PUFFS INTO THE LUNGS TWICE DAILY, Disp: 10.2 g, Rfl: 12 .  tamsulosin (FLOMAX) 0.4 MG CAPS capsule, Take 1 capsule (0.4 mg total) by mouth daily., Disp: 90 capsule, Rfl: 4 .  tiZANidine (ZANAFLEX) 4 MG tablet, TAKE ONE TABLET AT  BEDTIME (Patient taking differently: TAKE ONE TABLET AT BEDTIME AS NEEDED FOR SPASMS), Disp: 30 tablet, Rfl: 12  Review of Systems  Constitutional: Negative for appetite change, chills and fever.  Respiratory: Positive for cough (productive with clear phlegm) and shortness of breath. Negative for chest tightness and wheezing.   Cardiovascular: Negative for chest pain and palpitations.  Gastrointestinal: Negative for abdominal pain, nausea and vomiting.    Social History   Tobacco Use  . Smoking status: Former Smoker    Packs/day: 1.00    Years: 11.00    Pack years: 11.00    Types: Cigarettes  . Smokeless tobacco: Never Used  . Tobacco comment: quit smoking 30 yrs ago  Substance Use Topics  . Alcohol use: Yes    Alcohol/week: 0.0 oz    Comment: beer occasionally   Objective:   BP 112/62 (BP Location: Left Arm, Patient Position: Sitting, Cuff Size: Large)   Pulse 65   Temp 98.5 F (36.9 C) (Oral)   Resp 18   Wt 185 lb (83.9 kg)   SpO2 98% Comment: room air  BMI 28.13 kg/m     Physical Exam   General Appearance:    Alert, cooperative, no distress  Eyes:    PERRL, conjunctiva/corneas clear, EOM's intact       Lungs:     Clear to auscultation bilaterally, respirations unlabored  Heart:    Regular rate and rhythm  Neurologic:   Awake, alert, oriented x 3. Slightly diminishes s/s left hand. +5 muscle strength bilaterally.           Assessment & Plan:      1. Pneumonia of right middle lobe due to infectious organism [facility]) Clinically resolved. To follow up pulmonary tomorrow.   2. Late effect of cerebrovascular accident (CVA) Mostly resolved but still with slight sensory deficit of left UE. Continue Eliquis, is off of Plavix and ECASA.   Return in about 3 months (around 02/14/2018).       Lelon Huh, MD  [facility]

## 2017-11-15 ENCOUNTER — Other Ambulatory Visit: Payer: Self-pay | Admitting: Internal Medicine

## 2017-11-15 ENCOUNTER — Encounter: Payer: Self-pay | Admitting: Internal Medicine

## 2017-11-15 ENCOUNTER — Telehealth: Payer: Self-pay | Admitting: *Deleted

## 2017-11-15 ENCOUNTER — Ambulatory Visit: Payer: Medicare Other | Admitting: Internal Medicine

## 2017-11-15 VITALS — BP 118/68 | HR 54

## 2017-11-15 DIAGNOSIS — J449 Chronic obstructive pulmonary disease, unspecified: Secondary | ICD-10-CM

## 2017-11-15 DIAGNOSIS — R918 Other nonspecific abnormal finding of lung field: Secondary | ICD-10-CM

## 2017-11-15 DIAGNOSIS — C3431 Malignant neoplasm of lower lobe, right bronchus or lung: Secondary | ICD-10-CM | POA: Diagnosis not present

## 2017-11-15 DIAGNOSIS — R042 Hemoptysis: Secondary | ICD-10-CM | POA: Diagnosis not present

## 2017-11-15 MED ORDER — ALBUTEROL SULFATE HFA 108 (90 BASE) MCG/ACT IN AERS: INHALATION_SPRAY | RESPIRATORY_TRACT | 5 refills | Status: DC

## 2017-11-15 NOTE — Telephone Encounter (Signed)
Called [employer] and spoke with Aretha at 2:52 pm EST. No prior authorization required for procedure code [id number]. Call Ref # [id number]/15/2017. Rhonda J Cobb

## 2017-11-15 NOTE — Telephone Encounter (Signed)
Patient will be having a regular bronchoscopy. Date: either 11/19/17 or 11/24/17 DX:  RT lung mass R91.8 Dr. Ashby Dawes performing procedure.

## 2017-11-15 NOTE — Patient Instructions (Signed)
--  Will set up bronchoscopy. Do not take eliquis the night before or the day of the procedure.

## 2017-11-16 ENCOUNTER — Telehealth: Payer: Self-pay | Admitting: *Deleted

## 2017-11-16 NOTE — Telephone Encounter (Signed)
Called patient with scheduling information for bronch. Appt 11/19/17 patient to arrive at 12noon. NPO after midnight Sunday. Must have a driver to during procedure. Stop Eliquis on Saturday. Patient verbalized understanding. Nothing further needed.

## 2017-11-19 ENCOUNTER — Encounter
Admission: RE | Disposition: A | Discharge status: Home / Self Care | Payer: Self-pay | Source: Ambulatory Visit | Attending: Internal Medicine

## 2017-11-19 ENCOUNTER — Ambulatory Visit
Admission: RE | Admit: 2017-11-19 | Discharge: 2017-11-19 | Disposition: A | Discharge status: Home / Self Care | Payer: Medicare Other | Source: Ambulatory Visit | Attending: Internal Medicine | Admitting: Internal Medicine

## 2017-11-19 ENCOUNTER — Other Ambulatory Visit: Payer: Self-pay | Admitting: Family Medicine

## 2017-11-19 DIAGNOSIS — R911 Solitary pulmonary nodule: Secondary | ICD-10-CM | POA: Insufficient documentation

## 2017-11-19 DIAGNOSIS — Z7902 Long term (current) use of antithrombotics/antiplatelets: Secondary | ICD-10-CM | POA: Insufficient documentation

## 2017-11-19 DIAGNOSIS — J189 Pneumonia, unspecified organism: Secondary | ICD-10-CM | POA: Insufficient documentation

## 2017-11-19 DIAGNOSIS — R918 Other nonspecific abnormal finding of lung field: Secondary | ICD-10-CM

## 2017-11-19 DIAGNOSIS — Z79899 Other long term (current) drug therapy: Secondary | ICD-10-CM | POA: Insufficient documentation

## 2017-11-19 DIAGNOSIS — I4891 Unspecified atrial fibrillation: Secondary | ICD-10-CM | POA: Diagnosis not present

## 2017-11-19 DIAGNOSIS — I69354 Hemiplegia and hemiparesis following cerebral infarction affecting left non-dominant side: Secondary | ICD-10-CM | POA: Diagnosis not present

## 2017-11-19 DIAGNOSIS — I5020 Unspecified systolic (congestive) heart failure: Secondary | ICD-10-CM | POA: Diagnosis not present

## 2017-11-19 DIAGNOSIS — J440 Chronic obstructive pulmonary disease with acute lower respiratory infection: Secondary | ICD-10-CM | POA: Insufficient documentation

## 2017-11-19 DIAGNOSIS — Z7901 Long term (current) use of anticoagulants: Secondary | ICD-10-CM | POA: Insufficient documentation

## 2017-11-19 HISTORY — PX: FLEXIBLE BRONCHOSCOPY: SHX5094

## 2017-11-19 SURGERY — BRONCHOSCOPY, FLEXIBLE
Anesthesia: Moderate Sedation

## 2017-11-19 MED ORDER — LIDOCAINE HCL 2 % EX GEL
1.0000 "application " | Freq: Once | CUTANEOUS | Status: DC
  Filled 2017-11-19: qty 5

## 2017-11-19 MED ORDER — FENTANYL CITRATE (PF) 100 MCG/2ML IJ SOLN
INTRAMUSCULAR | Status: AC
  Filled 2017-11-19: qty 4

## 2017-11-19 MED ORDER — MIDAZOLAM HCL 5 MG/5ML IJ SOLN
INTRAMUSCULAR | Status: AC
  Filled 2017-11-19: qty 10

## 2017-11-19 MED ORDER — BUTAMBEN-TETRACAINE-BENZOCAINE 2-2-14 % EX AERO
1.0000 | INHALATION_SPRAY | Freq: Once | CUTANEOUS | Status: DC
  Filled 2017-11-19: qty 20

## 2017-11-19 MED ORDER — MIDAZOLAM HCL 2 MG/2ML IJ SOLN
INTRAMUSCULAR | Status: AC | PRN
  Administered 2017-11-19 (×2): 2 mg via INTRAVENOUS
  Administered 2017-11-19 (×2): 1 mg via INTRAVENOUS

## 2017-11-19 MED ORDER — PHENYLEPHRINE HCL 0.25 % NA SOLN
1.0000 | Freq: Four times a day (QID) | NASAL | Status: DC | PRN
  Filled 2017-11-19: qty 15

## 2017-11-19 MED ORDER — FENTANYL CITRATE (PF) 100 MCG/2ML IJ SOLN
INTRAMUSCULAR | Status: AC | PRN
  Administered 2017-11-19 (×4): 25 ug via INTRAVENOUS

## 2017-11-19 NOTE — Discharge Instructions (Signed)
Flexible Bronchoscopy, Care After These instructions give you information on caring for yourself after your procedure. Your doctor may also give you more specific instructions. Call your doctor if you have any problems or questions after your procedure. Follow these instructions at home:  Do not eat or drink anything for 2 hours after your procedure. If you try to eat or drink before the medicine wears off, food or drink could go into your lungs. You could also burn yourself.  After 2 hours have passed and when you can cough and gag normally, you may eat soft food and drink liquids slowly.  The day after the test, you may eat your normal diet.  You may do your normal activities.  Keep all doctor visits. Get help right away if:  You get more and more short of breath.  You get light-headed.  You feel like you are going to pass out (faint).  You have chest pain.  You have new problems that worry you.  You cough up more than a little blood.  You cough up more blood than before. This information is not intended to replace advice given to you by your health care provider. Make sure you discuss any questions you have with your health care provider. Document Released: 10/01/2009 Document Revised: 05/11/2016 Document Reviewed: 08/08/2013 Elsevier Interactive Patient Education  2017 [employer].

## 2017-11-19 NOTE — Interval H&P Note (Signed)
History and Physical Interval Note:  11/19/2017 12:14 PM  James [name]  has presented today for surgery, with the diagnosis of Regular bronchoscopy    Lung mass [facility] emailed   Carm needed     cc:  T Carolanne Grumbling  The various methods of treatment have been discussed with the patient and family. After consideration of risks, benefits and other options for treatment, the patient has consented to  Procedure(s): FLEXIBLE BRONCHOSCOPY (N/A) as a surgical intervention .  The patient's history has been reviewed, patient examined, no change in status, stable for surgery.  I have reviewed the patient's chart and labs.  Questions were answered to the patient's satisfaction.     Laverle Hobby

## 2017-11-19 NOTE — Progress Notes (Signed)
Dr. Ashby Dawes in at bedside to speak with pt. & spouse re: bronch. Both verbalized understanding. DC instructions reviewed with both with verbalized understanding.

## 2017-11-19 NOTE — Telephone Encounter (Signed)
Pharmacy requesting refills. Thanks!  

## 2017-11-19 NOTE — Procedures (Signed)
  [facility]            Bronchoscopy Note   FINDINGS/SUMMARY:  -Copious thick mucosal secretions throughout both lungs which were suctioned and removed. -Evidence of external compression narrowed airways in the right middle lobe and lower segments, secondary to known elevated right diaphragm with atelectasis. - No endobronchial lesion seen. -Transbronchial brushings taken from right upper lobe, right middle lobe, and multiple segments of the right lower lobe.  Bronchoalveolar lavage taken from the right lower lobe.  Indication: lung mass, possible cancer versus inflammatory mass/rounded atelectasis. The patient (or their representative) was informed of the risks (including but not limited to bleeding, infection, respiratory failure, lung injury, tooth/oral injury) and benefits of the procedure and gave consent, see chart.   Pre-op diagnosis: Lung mass Post-op diagnosis: same Estimated blood loss:20cc  Medications for procedure: Versed 6 mg, fentanyl 100 mcg. Conscious sedation time 31 minutes.  Procedure description: After obtaining informed consent a time out was called. Sats remained above 90% for the duration of the procedure, sedation as above. The patients nares were checked for patency, both were narrow.  A bite block was placed, the flexible fiberoptic bronchoscope was passed via the oral cavity to the posterior pharynx, topical lidocaine was applied.  Normal movements of the vocal cords was appreciated.  The bronchoscope was then passed to the trachea, topical lidocaine was applied to the right and left mainstem bronchi.  An anatomical tumor was undertaken, there was thick mucosa secretions throughout both airways which were suctioned.  No abnormalities were found in the left side.  On the right side of the right middle lobe bronchi were narrowed from external compression this was also seen in the basilar segments of the right lower lobe. Transbronchial brushings  were taken of the right lower lobe of the anterior, lateral and posterior segments.  Washings were also obtained in the basilar segments of the right lower lobe.  The bronchoscope was then taken to the right middle lobe, transbronchial brushings were obtained from the right middle lobe as well.  The bronchoscope was then taken to the right upper lobe, transbronchial cytology brushings were taken from the posterior segment of the right upper lobe.  The bronchoscope was then removed.  Patient tolerated the procedure well.    Condition post procedure: stable   Complications: none noted.   Patient instructions: May start Eliquis tonight.    Marda Stalker, MD.  Board Certified in Internal Medicine, Pulmonary Medicine, [place], and Sleep Medicine.  [facility] Office Number: [phone]  Patricia Pesa, M.D.  Cheral Marker, M.D  11/19/2017

## 2017-11-20 ENCOUNTER — Encounter: Payer: Self-pay | Admitting: Internal Medicine

## 2017-11-21 LAB — ACID FAST SMEAR (AFB, MYCOBACTERIA): Acid Fast Smear: NEGATIVE

## 2017-11-21 LAB — CYTOLOGY - NON PAP

## 2017-11-21 LAB — CULTURE, RESPIRATORY W GRAM STAIN: Culture: NORMAL

## 2017-11-21 LAB — CULTURE, RESPIRATORY

## 2017-11-22 NOTE — Progress Notes (Addendum)
[facility] Consultation      Assessment and Plan:  The patient has chronic progressive dyspnea, which is thought to be multifactorial due to COPD, systolic congestive heart failure, elevated hemidiaphragm. Now following up because of a lung nodule.  ----------------------------------------------------------------------------------------------- Addendum 02/06/18; Lung cancer conference discussion.  Assessment The patient was discussed today at lung cancer conference, the patient was found to have a enlarging right lower lobe lung mass, which was PET positive.  He underwent bronchoscopy due to malignancy which was negative.  Subsequently he he underwent CT-guided lung biopsy on 02/11/18.  DIAGNOSIS:  A. LUNG MASS, RIGHT LOWER LOBE; CT GUIDED BIOPSY:  - ADENOCARCINOMA WITH MICROPAPILLARY AND LEPIDIC PATTERNS (IASLC/ATS/ERS  CLASSIFICATION).   Spirometry 05/25/16; FEV1 equals 52%. Spirometry 09/28/16; FEV1 equals 48%.  --Pt informed of results and plan via telephone today.   Plan - Referred to thoracic surgery for possible resection, patient may benefit from repeat full pulmonary function test as above spirometry may not have been accurate. -Referred to oncology for possible adjuvant chemotherapy as needed.   -----------------------------------------------------------------------------------------------  Lung nodule with hemoptysis.James [name]  --RLL nodule/mass.  Patient was sent for a PET scan 09/25/17 which was mildly positive; case was discussed at tumor board subsequently, it was recommended that he have a repeat CT scan in approximately 3 months.  -Patient is status post bronchoscopy with brushings and washings taken from the right lower lobe which were negative for malignancy. -Given the patient's high stroke risk, he had a stroke after being off of anticoagulation for 2 days in the past, we will continue Eliquis for the time being. -Given the negative results I will treat  the patient empirically for bacterial versus organizing pneumonia.  He will be given a one-month course of steroids, and a 10-day course of antibiotics.  Repeat CT afterwards.  COPD.  --Continue spiriva, symbicort.  --s/p Prevnar-13 vaccine.  Deconditioning/Debility.  --Continue activity, can consider referral to pulm rehab in future.   Elevated right diaphragm.  --Present for many years, likely contributing to dyspnea.   Chronic systolic cardiac dysfunction.  --Stable EF of 40%.   Dyspnea. --Dyspnea is likely multifactorial due to all of the above issues.  --Continue current inhalers.   Orders Placed This Encounter  Procedures  . CT CHEST WO CONTRAST   Meds ordered this encounter  Medications  . predniSONE (DELTASONE) 10 MG tablet    Sig: 4 tabs daily for one week, then 3 tabs daily for one week, then 2 tabs daily for one week, then  1 tab daily for one week, then stop.    Dispense:  75 tablet    Refill:  0  . amoxicillin-clavulanate (AUGMENTIN) 875-125 MG tablet    Sig: Take 1 tablet by mouth 2 (two) times daily.    Dispense:  20 tablet    Refill:  0   Return in about 2 months (around 01/24/2018).  Addendum 01/10/18: Discussed at lung cancer conference, will plan for CT guided biopsy of RLL mass. Pt called and informed of plan.  -DR.  Date: 11/22/2017  MRN# [id number] DEAUNDRA KUTZER 04-03-1929   James [name] is a 83 y.o. old male seen in consultation for chief complaint of:    Chief Complaint  Patient presents with  . Follow-up    bronch f/u: spitting up red blood since procedure; cough at times:    HPI:   The patient is a 83 year old male with chronic progressive dyspnea, which is thought to be multifactorial due to COPD,  systolic congestive heart failure, elevated hemidiaphragm. He was recently admitted to the hospital for pneumonia, he has been following her because abnormal lung nodules, complicated by hemoptysis, patient is on Eliquis.  Last visit he was  asked to continue Spiriva, Symbicort, increased physical activity level, and using albuterol MDI with exercise.  He has since undergone a bronchoscopy, results were negative.  He has continued to have some mild hemoptysis.  He was on plavix in the past, it was stopped, and a day later he was changed to eliquis, it was at that time that he had the stroke.   CT chest on 03/13/17; this showed chronic interstitial changes, a 3 cm mass-like opacity on posterior right costophrenic sulcus. He was recently sent for a repeat CT on 09/17/17. On my personal review, there is RLL atelectasis with elevated right diaphragm. There is mass in the RLL posteriorly, this appears to be slightly larger than previous scan in March 2018.   He has cats and dogs at home, seldom in the bedroom.  Review of Dr. Bethanne Ginger records: He is noted to have controlled Afib, most recent echocardiogram showed stable systolic function of about 40%.   **CXR  05/22/16; elevated right diaphragm, stable for several years.   **Spirometry; 09/28/16; restriction with FEV1=48%  Medication:    Reviewed.     Allergies:  Indomethacin  Review of Systems: Gen:  Denies  fever, sweats, chills HEENT: Denies blurred vision, double vision. bleeds, sore throat Cvc:  No dizziness, chest pain. Resp:   NOTES recurring hemoptysis.  Gi: Denies swallowing difficulty, stomach pain. Gu:  Denies bladder incontinence, burning urine Ext:   No Joint pain, stiffness. Skin: No skin rash,  hives  Endoc:  No polyuria, polydipsia. Psych: No depression, insomnia. Other:  All other systems were reviewed with the patient and were negative other that what is mentioned in the HPI.   Physical Examination:   VS: BP 108/70 (BP Location: Left Arm, Cuff Size: Normal)   Pulse 71   Ht 5\' 8"  (1.727 m)   Wt 184 lb (83.5 kg)   SpO2 97%   BMI 27.98 kg/m   General Appearance: No distress  Neuro:without focal findings,  speech normal,  HEENT: PERRLA, EOM intact.     Pulmonary: normal breath sounds, No wheezing. Decreased air entry bilaterally.   CardiovascularNormal S1,S2.  No m/r/g.   Abdomen: Benign, Soft, non-tender. Renal:  No costovertebral tenderness  GU:  No performed at this time. Endoc: No evident thyromegaly, no signs of acromegaly. Skin:   warm, no rashes, no ecchymosis  Extremities: normal, no cyanosis, clubbing.  Other findings:    LABORATORY PANEL:   CBC No results for input(s): WBC, HGB, HCT, PLT in the last 168 hours. ------------------------------------------------------------------------------------------------------------------  Chemistries  No results for input(s): NA, K, CL, CO2, GLUCOSE, BUN, CREATININE, CALCIUM, MG, AST, ALT, ALKPHOS, BILITOT in the last 168 hours.  Invalid input(s): GFRCGP ------------------------------------------------------------------------------------------------------------------  Cardiac Enzymes No results for input(s): TROPONINI in the last 168 hours. ------------------------------------------------------------  RADIOLOGY:  No results found.     Thank  you for the consultation and for allowing [facility] to assist in the care of your patient. Our recommendations are noted above.  Please contact us if we can be of further service.   Marda Stalker, MD.  Board Certified in Internal Medicine, Pulmonary Medicine, [place], and Sleep Medicine.   Pulmonary and Critical Care Office Number: [phone]  Patricia Pesa, M.D.  Merton Border, M.D  11/22/2017 

## 2017-11-23 ENCOUNTER — Ambulatory Visit: Payer: Medicare Other | Admitting: Internal Medicine

## 2017-11-23 ENCOUNTER — Encounter: Payer: Self-pay | Admitting: Internal Medicine

## 2017-11-23 DIAGNOSIS — R911 Solitary pulmonary nodule: Secondary | ICD-10-CM

## 2017-11-23 MED ORDER — AMOXICILLIN-POT CLAVULANATE 875-125 MG PO TABS: 1.0000 | ORAL_TABLET | Freq: Two times a day (BID) | ORAL | 0 refills | Status: DC

## 2017-11-23 MED ORDER — PREDNISONE 10 MG PO TABS: ORAL_TABLET | ORAL | 0 refills | Status: DC

## 2017-11-23 NOTE — Patient Instructions (Addendum)
Will start prednisone for one month.  4 tabs daily for one week, then 3 tabs daily for one week, then 2 tabs daily for one week, then  1 tab daily for one week, then stop.   Also tabs augmentin twice daily for 10 days.   We will repeat a CT chest in about 6 weeks.

## 2017-11-27 LAB — VIRUS CULTURE

## 2017-12-01 ENCOUNTER — Other Ambulatory Visit: Payer: Self-pay | Admitting: Family Medicine

## 2017-12-04 ENCOUNTER — Telehealth: Payer: Self-pay

## 2017-12-04 NOTE — Telephone Encounter (Signed)
-----   Message from Birdie Sons, MD sent at 12/04/2017  8:28 AM EST ----- Please advise patient that we received results of bronchial cultures that were collected in hospital and it shows presence of candida (yeast). If he is having any respiratory symptoms such as cough  then he should start diflucan 200mg  daily x 14 days and follow up in 2 weeks.

## 2017-12-04 NOTE — Telephone Encounter (Signed)
Patient advised as below. Patient reports he is feeling much better denies any cough shortness of breath or any breathing problems. Patient refused to start Diflucan at this time. Patient reports that he will call if any changes. sd

## 2017-12-08 ENCOUNTER — Other Ambulatory Visit: Payer: Self-pay | Admitting: Family Medicine

## 2017-12-12 LAB — CULTURE, FUNGUS WITHOUT SMEAR

## 2018-01-02 LAB — ACID FAST CULTURE WITH REFLEXED SENSITIVITIES (MYCOBACTERIA): Acid Fast Culture: NEGATIVE

## 2018-01-07 ENCOUNTER — Other Ambulatory Visit: Payer: Self-pay | Admitting: Family Medicine

## 2018-01-07 ENCOUNTER — Ambulatory Visit
Admission: RE | Admit: 2018-01-07 | Discharge: 2018-01-07 | Disposition: A | Discharge status: Home / Self Care | Payer: Medicare Other | Source: Ambulatory Visit | Attending: Internal Medicine | Admitting: Internal Medicine

## 2018-01-07 DIAGNOSIS — K449 Diaphragmatic hernia without obstruction or gangrene: Secondary | ICD-10-CM | POA: Diagnosis not present

## 2018-01-07 DIAGNOSIS — J181 Lobar pneumonia, unspecified organism: Secondary | ICD-10-CM | POA: Insufficient documentation

## 2018-01-07 DIAGNOSIS — R911 Solitary pulmonary nodule: Secondary | ICD-10-CM | POA: Diagnosis not present

## 2018-01-07 DIAGNOSIS — I700 Atherosclerosis of aorta: Secondary | ICD-10-CM | POA: Insufficient documentation

## 2018-01-08 ENCOUNTER — Telehealth: Payer: Self-pay | Admitting: *Deleted

## 2018-01-08 NOTE — Telephone Encounter (Signed)
-----   Message from Renelda Mom, [place] sent at 03/01/1760 11:59 AM EST ----- Please add pt to tumor board to review CT per DR.  Thanks, [employer]

## 2018-01-08 NOTE — Telephone Encounter (Signed)
Order entered to add pt for discussion at tumor board on 01/10/18.

## 2018-01-11 ENCOUNTER — Ambulatory Visit: Payer: Medicare Other | Admitting: Family Medicine

## 2018-01-11 ENCOUNTER — Encounter: Payer: Self-pay | Admitting: Family Medicine

## 2018-01-11 VITALS — BP 118/60 | HR 79 | Temp 98.0°F | Resp 18 | Wt 196.0 lb

## 2018-01-11 DIAGNOSIS — J449 Chronic obstructive pulmonary disease, unspecified: Secondary | ICD-10-CM | POA: Diagnosis not present

## 2018-01-11 DIAGNOSIS — J441 Chronic obstructive pulmonary disease with (acute) exacerbation: Secondary | ICD-10-CM

## 2018-01-11 DIAGNOSIS — J40 Bronchitis, not specified as acute or chronic: Secondary | ICD-10-CM

## 2018-01-11 MED ORDER — PREDNISONE 10 MG PO TABS: ORAL_TABLET | ORAL | 0 refills | Status: AC

## 2018-01-11 MED ORDER — AMOXICILLIN-POT CLAVULANATE 875-125 MG PO TABS: 1.0000 | ORAL_TABLET | Freq: Two times a day (BID) | ORAL | 0 refills | Status: DC

## 2018-01-11 NOTE — Progress Notes (Signed)
Patient: James [name] Male    DOB: 10-25-1929   84 y.o.   MRN: [id number] Visit Date: 01/11/2018  Today's Provider: Lelon Huh, MD   Chief Complaint  Patient presents with  . Cough   Subjective:    Cough  This is a recurrent problem. Episode onset: 2 weeks ago. The problem has been unchanged. The cough is productive of sputum (white colored sputum). Associated symptoms include shortness of breath and wheezing. Pertinent negatives include no chest pain, chills or fever. Treatments tried: Mucinex. The treatment provided moderate relief.   Started on 4 weeks prednisone taper by pulmonologist on 11-23-2017 during which cough resolved. He started having wheezing yesterday.     Allergies  Allergen Reactions  . Indomethacin Nausea Only     Current Outpatient Medications:  .  albuterol (PROAIR HFA) 108 (90 Base) MCG/ACT inhaler, INHALE 2 PUFFS INTO LUNGS EVERY 6 HOURS AS NEEDED FOR WHEEZING OR SHORTNESS OF BREATH, Disp: 8.5 g, Rfl: 5 .  apixaban (ELIQUIS) 5 MG TABS tablet, Take 1 tablet (5 mg total) by mouth 2 (two) times daily., Disp: 60 tablet, Rfl: 5 .  atorvastatin (LIPITOR) 80 MG tablet, TAKE 1 TABLET BY MOUTH DAILY, Disp: 90 tablet, Rfl: 4 .  azelastine (OPTIVAR) 0.05 % ophthalmic solution, APPLY 1 DROP TO EYE TWICE DAILY, Disp: 6 mL, Rfl: 5 .  benazepril-hydrochlorthiazide (LOTENSIN HCT) 20-12.5 MG tablet, Take 0.5 tablets by mouth daily., Disp: 3 tablet, Rfl: 0 .  beta carotene w/minerals (OCUVITE) tablet, Take 1 tablet by mouth daily., Disp: , Rfl:  .  docusate sodium (COLACE) 100 MG capsule, Take 1 capsule (100 mg total) by mouth 2 (two) times daily. (Patient taking differently: Take 100 mg 2 (two) times daily as needed by mouth for mild constipation. ), Disp: 60 capsule, Rfl: 0 .  fluticasone (FLONASE) 50 MCG/ACT nasal spray, Place 2 sprays into both nostrils daily. (Patient taking differently: Place 2 sprays into both nostrils every evening. ), Disp: 48 g, Rfl: 5 .   HYDROcodone-acetaminophen (NORCO) 10-325 MG tablet, Take 1 tablet by mouth every 6 (six) hours as needed., Disp: 30 tablet, Rfl: 0 .  lansoprazole (PREVACID) 30 MG capsule, TAKE 1 CAPSULE BY MOUTH EVERY DAY, Disp: 30 capsule, Rfl: 12 .  LORazepam (ATIVAN) 1 MG tablet, Take 0.5-1 tablets (0.5-1 mg total) by mouth at bedtime as needed for sleep., Disp: 30 tablet, Rfl: 4 .  Magnesium 400 MG CAPS, Take 1 capsule by mouth daily. , Disp: , Rfl:  .  metoprolol tartrate (LOPRESSOR) 50 MG tablet, TAKE ONE TABLET BY MOUTH TWICE DAILY, Disp: 180 tablet, Rfl: 4 .  montelukast (SINGULAIR) 10 MG tablet, TAKE ONE TABLET BY MOUTH EVERY DAY, Disp: 30 tablet, Rfl: 12 .  MULTIPLE VITAMIN PO, Take 1 tablet by mouth daily. , Disp: , Rfl:  .  Omega-3 Fatty Acids (FISH OIL) 1000 MG CAPS, Take 1 capsule daily by mouth., Disp: , Rfl:  .  SPIRIVA HANDIHALER 18 MCG inhalation capsule, INHALE 1 CAPSULE AS DIRECTED ONCE A DAY, Disp: 30 capsule, Rfl: 12 .  SYMBICORT 160-4.5 MCG/ACT inhaler, INHALE 2 PUFFS INTO THE LUNGS TWICE DAILY, Disp: 10.2 g, Rfl: 12 .  tamsulosin (FLOMAX) 0.4 MG CAPS capsule, TAKE 1 CAPSULE BY MOUTH DAILY, Disp: 90 capsule, Rfl: 3 .  tiZANidine (ZANAFLEX) 4 MG tablet, TAKE ONE TABLET AT BEDTIME (Patient taking differently: TAKE ONE TABLET AT BEDTIME AS NEEDED FOR SPASMS), Disp: 30 tablet, Rfl: 12 .  amoxicillin-clavulanate (AUGMENTIN)  875-125 MG tablet, Take 1 tablet by mouth 2 (two) times daily., Disp: 20 tablet, Rfl: 0 .  predniSONE (DELTASONE) 10 MG tablet, 4 tabs daily for one week, then 3 tabs daily for one week, then 2 tabs daily for one week, then  1 tab daily for one week, then stop., Disp: 75 tablet, Rfl: 0  Review of Systems  Constitutional: Negative for appetite change, chills and fever.  HENT: Positive for congestion.   Respiratory: Positive for cough, shortness of breath and wheezing. Negative for chest tightness.   Cardiovascular: Negative for chest pain and palpitations.  Gastrointestinal:  Negative for abdominal pain, nausea and vomiting.    Social History   Tobacco Use  . Smoking status: Former Smoker    Packs/day: 1.00    Years: 11.00    Pack years: 11.00    Types: Cigarettes  . Smokeless tobacco: Never Used  . Tobacco comment: quit smoking 30 yrs ago  Substance Use Topics  . Alcohol use: Yes    Alcohol/week: 0.0 oz    Comment: beer occasionally   Objective:   BP 118/60 (BP Location: Left Arm, Patient Position: Sitting, Cuff Size: Large)   Pulse 79   Temp 98 F (36.7 C) (Oral)   Resp 18   Wt 196 lb (88.9 kg)   SpO2 98% Comment: room air  BMI 29.80 kg/m  Vitals:   01/11/18 1356  BP: 118/60  Pulse: 79  Resp: 18  Temp: 98 F (36.7 C)  TempSrc: Oral  SpO2: 98%  Weight: 196 lb (88.9 kg)     Physical Exam  General Appearance:    Alert, cooperative, no distress  HENT:   bilateral TM normal without fluid or infection, neck without nodes, throat normal without erythema or exudate and nasal mucosa congested  Eyes:    PERRL, conjunctiva/corneas clear, EOM's intact       Lungs:     .Occasional expiratory wheeze, no rales, , respirations unlabored  Heart:    Regular rate and rhythm  Neurologic:   Awake, alert, oriented x 3. No apparent focal neurological           defect.           Assessment & Plan:     1. Bronchitis  - amoxicillin-clavulanate (AUGMENTIN) 875-125 MG tablet; Take 1 tablet by mouth 2 (two) times daily.  Dispense: 20 tablet; Refill: 0  2. Chronic obstructive pulmonary disease, unspecified COPD type ([place])   3. COPD with acute exacerbation (HCC)  - predniSONE (DELTASONE) 10 MG tablet; 5 tablets for 2 days, then 4 for 2 days, then 3 for 2 days, then 2 for 2 days, then 1 for 2 days.  Dispense: 30 tablet; Refill: 0  Call if symptoms change or if not rapidly improving.           Lelon Huh, MD  [facility]

## 2018-01-15 ENCOUNTER — Other Ambulatory Visit: Payer: Self-pay | Admitting: Family Medicine

## 2018-01-22 ENCOUNTER — Telehealth: Payer: Self-pay | Admitting: Internal Medicine

## 2018-01-22 NOTE — Telephone Encounter (Signed)
Please call regarding getting biopsy scheduled.

## 2018-01-23 NOTE — Telephone Encounter (Signed)
Left message per Dr. Ashby Dawes biopsy should be arranged by radiologist. Asked that patient contact oncology for f/u of this. He may call our office back if he has any further questions. Nothing further needed.

## 2018-01-28 ENCOUNTER — Telehealth: Payer: Self-pay | Admitting: Internal Medicine

## 2018-01-28 NOTE — Telephone Encounter (Signed)
Please call regarding getting biopsy scheduled.  He states he's not received a call back from them  Please advise

## 2018-01-28 NOTE — Telephone Encounter (Signed)
Patient did not get message left last week. Pt still has not received call from radiology about scheduling biopsy. Informed patient that he should get call from radiology.

## 2018-01-30 ENCOUNTER — Telehealth: Payer: Self-pay | Admitting: Internal Medicine

## 2018-01-30 ENCOUNTER — Other Ambulatory Visit: Payer: Self-pay | Admitting: Family Medicine

## 2018-01-30 DIAGNOSIS — R0602 Shortness of breath: Secondary | ICD-10-CM

## 2018-01-30 NOTE — Telephone Encounter (Signed)
Called patient and left message.to make aware that he needs a medical clearance from Dr. Caryn Section to remain of Eliquis for 48 hours.

## 2018-01-30 NOTE — Telephone Encounter (Signed)
Are we ordering the CT bx? If so, we need to also place an order along with form.

## 2018-01-30 NOTE — Telephone Encounter (Signed)
Form filled out and order placed.

## 2018-01-30 NOTE — Telephone Encounter (Signed)
Pt calling stating he still has not received a call from Radiology   Please call back he is concerned he is missing something

## 2018-01-30 NOTE — Telephone Encounter (Signed)
Marcie Bal from scheduling is calling stating that Dr. Juanell Fairly needs to place an order for a CT Biopsy and she also needs medical clearance from Dr. Caryn Section for pt to hold his eliquis before surgery.  Please advise. Rhonda J Cobb

## 2018-01-30 NOTE — Addendum Note (Signed)
Addended by: Laverle Hobby on: 01/30/2018 03:36 PM   Modules accepted: Orders

## 2018-01-31 NOTE — Telephone Encounter (Signed)
Pt informed we will get the biopsy scheduled. He states he was placed on blood thinners when he was in the hospital.

## 2018-02-01 ENCOUNTER — Telehealth: Payer: Self-pay | Admitting: Family Medicine

## 2018-02-01 NOTE — Telephone Encounter (Signed)
Please advise 

## 2018-02-01 NOTE — Telephone Encounter (Signed)
Patient's wife was advised.

## 2018-02-01 NOTE — Telephone Encounter (Signed)
Patient states that he is having a biopsy done on his lungs by Dr. Ashby Dawes and they told him that he needed to call us and get approval to be off of his coumadin for 48 hours in order to do the procedure.  He states that he asked them if they could fax [name] something for this and they told him that he needed to call us for this and for [name] to contact them.  Please advise.

## 2018-02-01 NOTE — Telephone Encounter (Signed)
I think they mean the Eliquis. That's fine. The last dose of Eliquis should be >48 hours before procedure.

## 2018-02-04 ENCOUNTER — Telehealth: Payer: Self-pay | Admitting: *Deleted

## 2018-02-04 NOTE — Telephone Encounter (Signed)
Biopsy has been scheduled for Mon Feb 11, 2018 patient to arrive at 10 am at registration. Biopsy at 11am Patient needs to d/c Eliquis on Satuday.

## 2018-02-04 NOTE — Telephone Encounter (Signed)
Noted and faxed to PAT office.

## 2018-02-05 ENCOUNTER — Telehealth: Payer: Self-pay | Admitting: Family Medicine

## 2018-02-05 NOTE — Telephone Encounter (Signed)
Pt aware of biopsy information as stated in previous message.ss

## 2018-02-05 NOTE — Telephone Encounter (Signed)
Pt's wife states they do have an appt for the biopsy 2/25 @ 10am.  Pt's wife said to call if you have any other questions.

## 2018-02-07 ENCOUNTER — Other Ambulatory Visit: Payer: Self-pay | Admitting: Radiology

## 2018-02-08 ENCOUNTER — Other Ambulatory Visit: Payer: Self-pay | Admitting: Radiology

## 2018-02-11 ENCOUNTER — Ambulatory Visit
Admission: RE | Admit: 2018-02-11 | Discharge: 2018-02-11 | Disposition: A | Discharge status: Home / Self Care | Payer: Medicare Other | Source: Ambulatory Visit | Attending: Internal Medicine | Admitting: Internal Medicine

## 2018-02-11 ENCOUNTER — Ambulatory Visit
Admission: RE | Admit: 2018-02-11 | Discharge: 2018-02-11 | Disposition: A | Discharge status: Home / Self Care | Payer: Medicare Other | Source: Ambulatory Visit | Attending: Interventional Radiology | Admitting: Interventional Radiology

## 2018-02-11 DIAGNOSIS — C3431 Malignant neoplasm of lower lobe, right bronchus or lung: Secondary | ICD-10-CM | POA: Diagnosis not present

## 2018-02-11 DIAGNOSIS — R918 Other nonspecific abnormal finding of lung field: Secondary | ICD-10-CM | POA: Diagnosis not present

## 2018-02-11 DIAGNOSIS — J9811 Atelectasis: Secondary | ICD-10-CM | POA: Diagnosis not present

## 2018-02-11 DIAGNOSIS — R911 Solitary pulmonary nodule: Secondary | ICD-10-CM

## 2018-02-11 HISTORY — DX: Pneumonia, unspecified organism: J18.9

## 2018-02-11 HISTORY — DX: Cerebral infarction, unspecified: I63.9

## 2018-02-11 LAB — CBC
HCT: 43.8 % (ref 40.0–52.0)
Hemoglobin: 14.3 g/dL (ref 13.0–18.0)
MCH: 29.1 pg (ref 26.0–34.0)
MCHC: 32.7 g/dL (ref 32.0–36.0)
MCV: 88.9 fL (ref 80.0–100.0)
Platelets: 181 10*3/uL (ref 150–440)
RBC: 4.92 MIL/uL (ref 4.40–5.90)
RDW: 15 % — ABNORMAL HIGH (ref 11.5–14.5)
WBC: 5.6 10*3/uL (ref 3.8–10.6)

## 2018-02-11 LAB — PROTIME-INR
INR: 0.94
Prothrombin Time: 12.5 seconds (ref 11.4–15.2)

## 2018-02-11 MED ORDER — SODIUM CHLORIDE 0.9 % IV SOLN
INTRAVENOUS | Status: DC
  Administered 2018-02-11: 11:00:00 via INTRAVENOUS

## 2018-02-11 MED ORDER — FENTANYL CITRATE (PF) 100 MCG/2ML IJ SOLN
INTRAMUSCULAR | Status: AC
  Filled 2018-02-11: qty 4

## 2018-02-11 MED ORDER — LIDOCAINE HCL (PF) 1 % IJ SOLN
INTRAMUSCULAR | Status: AC | PRN
  Administered 2018-02-11: 7 mL

## 2018-02-11 MED ORDER — FENTANYL CITRATE (PF) 100 MCG/2ML IJ SOLN
INTRAMUSCULAR | Status: AC | PRN
  Administered 2018-02-11: 25 ug via INTRAVENOUS

## 2018-02-11 MED ORDER — MIDAZOLAM HCL 5 MG/5ML IJ SOLN
INTRAMUSCULAR | Status: AC
  Filled 2018-02-11: qty 5

## 2018-02-11 MED ORDER — MIDAZOLAM HCL 5 MG/5ML IJ SOLN
INTRAMUSCULAR | Status: AC | PRN
  Administered 2018-02-11: 0.5 mg via INTRAVENOUS

## 2018-02-11 NOTE — H&P (Signed)
Chief Complaint: Indeterminate RLL mass  Referring Physician(s): Ramachandran,Pradeep    History of Present Illness: James [name] is a 84 y.o. male with a persistent RLL mass like opacity showing low level PET and some interval enlargement on CT. He remains asymptomatic.  No sig cough, SOB, DOE, or fever.  Past Medical History:  Diagnosis Date  . Arthritis   . Back pain   . CAD (coronary artery disease)   . Cataract    right  . COPD (chronic obstructive pulmonary disease) ([place])    SPiriva and SYmbicort daily. Albuterol as needed  . Diverticulosis   . Dyspnea    with exertion  . Enlarged prostate    takes Flomax daily  . GERD (gastroesophageal reflux disease)   . History of chicken pox   . History of gout   . History of measles   . History of mumps   . HOH (hard of hearing)   . Hyperglycemia   . Hyperlipidemia    takes Atorvastatin daily  . Hypertension    takes Metoprolol daily as well as Lotensin HCT  . Hypotension   . Joint pain   . Microscopic colitis   . PAF (paroxysmal atrial fibrillation) ([place]), RVR 03/14/2017  . Pneumonia   . Prostate cancer ([place])   . Stroke [facility])    TIA  . TIA (transient ischemic attack)   . Weakness    numbness and tingling.mainly on right    Past Surgical History:  Procedure Laterality Date  . ANTERIOR CERVICAL DECOMP/DISCECTOMY FUSION N/A 03/07/2017   Procedure: ANTERIOR CERVICAL DECOMPRESSION/DISCECTOMY FUSION CERVICAL THREE- CERVICAL FOUR, CERVICAL FOUR- CERVICAL FIVE;  Surgeon: Newman Pies, MD;  Location: [facility];  Service: Neurosurgery;  Laterality: N/A;  ANTERIOR CERVICAL DECOMPRESSION/DISCECTOMY FUSION CERVICAL 3- CERVICAL 4, CERVIACL 4- CERVICAL 5  . APPENDECTOMY  1950  . CARDIAC CATHETERIZATION  05/29/2013   EF=40-45%. Moderate pulmonary hypertension. Infero/ lateral hypokinesis  . cataract surgery Left   . COLONOSCOPY    . CORONARY ARTERY BYPASS GRAFT  2011   x 5  . FLEXIBLE BRONCHOSCOPY N/A 11/19/2017   Procedure: FLEXIBLE BRONCHOSCOPY;  Surgeon: Laverle Hobby, MD;  Location: [facility];  Service: Pulmonary;  Laterality: N/A;  . MRI BRAIN  06/07/2013   Mild chronic involutional changes. No acute abnormalities  . MRI of neck    . Myocardial Perfusion Scan  05/29/2013   Abnormal myocardial perfusion image consistent with myocardial infarction  . PROSTATE SURGERY  11/18/2012   radiation seed  . TOTAL HIP ARTHROPLASTY Left 1997   hip joint replacement    Allergies: Indomethacin  Medications: Prior to Admission medications   Medication Sig Start Date End Date Taking? Authorizing Provider  albuterol (PROAIR HFA) 108 (90 Base) MCG/ACT inhaler INHALE 2 PUFFS INTO LUNGS EVERY 6 HOURS AS NEEDED FOR WHEEZING OR SHORTNESS OF BREATH 11/15/17  Yes Laverle Hobby, MD  benazepril-hydrochlorthiazide (LOTENSIN HCT) 20-12.5 MG tablet TAKE ONE TABLET BY MOUTH EVERY DAY 01/30/18  Yes Birdie Sons, MD  beta carotene w/minerals (OCUVITE) tablet Take 1 tablet by mouth daily.   Yes [provider]  docusate sodium (COLACE) 100 MG capsule Take 1 capsule (100 mg total) by mouth 2 (two) times daily. Patient taking differently: Take 100 mg 2 (two) times daily as needed by mouth for mild constipation.  03/08/17  Yes Newman Pies, MD  fluticasone [facility]) 50 MCG/ACT nasal spray USE 2 SPRAYS IN [facility] NOSTRIL DAILY 01/15/18  Yes Birdie Sons, MD  lansoprazole (PREVACID) 30 MG capsule TAKE 1 CAPSULE BY MOUTH EVERY DAY 08/06/17  Yes Birdie Sons, MD  LORazepam (ATIVAN) 1 MG tablet Take 0.5-1 tablets (0.5-1 mg total) by mouth at bedtime as needed for sleep. 11/20/17  Yes Birdie Sons, MD  Magnesium 400 MG CAPS Take 1 capsule by mouth daily.    Yes [provider]  metoprolol tartrate (LOPRESSOR) 50 MG tablet TAKE ONE TABLET BY MOUTH TWICE DAILY 11/13/17  Yes Birdie Sons, MD  montelukast (SINGULAIR) 10 MG tablet TAKE ONE TABLET BY MOUTH EVERY DAY 07/18/17  Yes Birdie Sons, MD  MULTIPLE VITAMIN PO Take 1 tablet by mouth daily.    Yes [provider]  Omega-3 Fatty Acids (FISH OIL) 1000 MG CAPS Take 1 capsule daily by mouth.   Yes [provider]  SPIRIVA HANDIHALER 18 MCG inhalation capsule INHALE THE CONTENTS OF ONE CAPSULE AS DIRECTED ONCE DAILY 01/30/18  Yes Birdie Sons, MD  SYMBICORT 160-4.5 MCG/ACT inhaler INHALE 2 PUFFS INTO THE LUNGS TWICE DAILY 07/23/17  Yes Birdie Sons, MD  tamsulosin (FLOMAX) 0.4 MG CAPS capsule TAKE 1 CAPSULE BY MOUTH DAILY 01/07/18  Yes Birdie Sons, MD  amoxicillin-clavulanate (AUGMENTIN) 875-125 MG tablet Take 1 tablet by mouth 2 (two) times daily. Patient not taking: Reported on 02/11/2018 01/11/18   Birdie Sons, MD  apixaban (ELIQUIS) 5 MG TABS tablet Take 1 tablet (5 mg total) by mouth 2 (two) times daily. 12/01/17   Birdie Sons, MD  atorvastatin (LIPITOR) 80 MG tablet TAKE 1 TABLET BY MOUTH DAILY 10/08/17   Birdie Sons, MD  azelastine (OPTIVAR) 0.05 % ophthalmic solution APPLY 1 DROP TO EYE TWICE DAILY 12/08/17   Birdie Sons, MD  HYDROcodone-acetaminophen (NORCO) 10-325 MG tablet Take 1 tablet by mouth every 6 (six) hours as needed. Patient not taking: Reported on 02/11/2018 07/10/17   Birdie Sons, MD  tiZANidine (ZANAFLEX) 4 MG tablet TAKE ONE TABLET AT BEDTIME Patient not taking: Reported on 02/11/2018 01/07/17   Birdie Sons, MD     Family History  Problem Relation Age of Onset  . Heart attack Mother   . Stroke Father   . Heart attack Father   . Hypertension Father   . Heart attack Sister   . Bladder Cancer Brother   . Kidney cancer Brother   . Heart Problems Brother     Social History   Socioeconomic History  . Marital status: Married    Spouse name: None  . Number of children: 1  . Years of education: None  . Highest education level: None  Social Needs  . Financial resource strain: None  . Food insecurity - worry: None  . Food insecurity - inability:  None  . Transportation needs - medical: None  . Transportation needs - non-medical: None  Occupational History  . Occupation: Retired  Tobacco Use  . Smoking status: Former Smoker    Packs/day: 1.00    Years: 11.00    Pack years: 11.00    Types: Cigarettes    Last attempt to quit: 02/12/1984    Years since quitting: 34.0  . Smokeless tobacco: Never Used  . Tobacco comment: quit smoking 30 yrs ago  Substance and Sexual Activity  . Alcohol use: Yes    Alcohol/week: 0.0 oz    Comment: beer occasionally  . Drug use: No  . Sexual activity: None  Other Topics Concern  . None  Social History Narrative  Lives in [place], [place].      Review of Systems: A 12 point ROS discussed and pertinent positives are indicated in the HPI above.  All other systems are negative.  Review of Systems  Vital Signs: BP (!) 120/56   Pulse 99   Temp 97.7 F (36.5 C) (Oral)   Resp (!) 27   Ht 5' 8.5" (1.74 m)   Wt 194 lb (88 kg)   SpO2 97%   BMI 29.07 kg/m   Physical Exam  Constitutional: He is oriented to person, place, and time. He appears well-developed and well-nourished. No distress.  Eyes: Conjunctivae are normal. No scleral icterus.  Cardiovascular: Normal rate and regular rhythm.  Pulmonary/Chest: Effort normal and breath sounds normal.  Abdominal: Soft. Bowel sounds are normal. He exhibits distension. There is no tenderness.  Neurological: He is alert and oriented to person, place, and time.  Skin: He is not diaphoretic.  Psychiatric: He has a normal mood and affect.    Imaging: No results found.  Labs:  CBC: Recent Labs    10/30/17 0835 11/06/17 1123 11/08/17 0434 02/11/18 1003  WBC 8.0 9.9 5.3 5.6  HGB 13.2 14.6 12.1* 14.3  HCT 41.9 44.3 36.2* 43.8  PLT 187 168 141* 181    COAGS: Recent Labs    10/16/17 1011 10/30/17 0835 02/11/18 1003  INR 0.96 0.91 0.94  APTT 29 28  --     BMP: Recent Labs    10/30/17 0835 11/06/17 1123 11/07/17 0502 11/08/17 0434   NA 132* 127* 133* 132*  K 4.5 4.5 4.1 3.9  CL 95* 94* 101 101  CO2 _0 GLUCOSE 103* 120* 102* 115*  BUN _1 CALCIUM 8.9 8.9 8.2* 8.2*  CREATININE 1.15 1.00 0.95 0.92  GFRNONAA 56* >60 >60 >60  GFRAA >60 >60 >60 >60    LIVER FUNCTION TESTS: Recent Labs    03/13/17 0946 10/30/17 0835 11/06/17 1123  BILITOT 1.7* 1.2 0.9  AST _2 ALT _3 ALKPHOS 66 81 80  PROT 6.7 6.5 6.8  ALBUMIN 3.7 3.6 3.9    TUMOR MARKERS: No results for input(s): AFPTM, CEA, CA199, CHROMGRNA in the last 8760 hours.  Assessment and Plan:  Posterior RLL mass like opacity persists and remains indeterminate.  Agree with plan for Bx  Consent obtained for CT bx RLL mass like abnormality  Risks and benefits discussed with the patient including, but not limited to bleeding, hemoptysis, respiratory failure requiring intubation, infection, pneumothorax requiring chest tube placement, stroke from air embolism or even death.  All of the patient's questions were answered, patient is agreeable to proceed. Consent signed and in chart.    Thank you for this interesting consult.  I greatly enjoyed meeting James [name] and look forward to participating in their care.  A copy of this report was sent to the requesting provider on this date.  Electronically Signed: Greggory Keen, MD 02/11/2018, 10:48 AM   I spent a total of  40 Minutes   in face to face in clinical consultation, greater than 50% of which was counseling/coordinating care for this patient with a RLL abnormality

## 2018-02-11 NOTE — Procedures (Signed)
RLL indeterminate mass  S/p CT RLL bx  No comp Stable Path pending Full report in pacs

## 2018-02-12 ENCOUNTER — Other Ambulatory Visit: Payer: Self-pay | Admitting: Pathology

## 2018-02-12 LAB — SURGICAL PATHOLOGY

## 2018-02-13 ENCOUNTER — Encounter: Payer: Self-pay | Admitting: Family Medicine

## 2018-02-13 ENCOUNTER — Encounter (INDEPENDENT_AMBULATORY_CARE_PROVIDER_SITE_OTHER): Payer: Self-pay

## 2018-02-13 ENCOUNTER — Ambulatory Visit: Payer: Medicare Other | Admitting: Family Medicine

## 2018-02-13 ENCOUNTER — Other Ambulatory Visit: Payer: Self-pay | Admitting: Family Medicine

## 2018-02-13 VITALS — BP 120/58 | HR 61 | Temp 97.7°F | Resp 16 | Ht 68.0 in | Wt 199.0 lb

## 2018-02-13 DIAGNOSIS — R05 Cough: Secondary | ICD-10-CM | POA: Diagnosis not present

## 2018-02-13 DIAGNOSIS — R059 Cough, unspecified: Secondary | ICD-10-CM

## 2018-02-13 DIAGNOSIS — J40 Bronchitis, not specified as acute or chronic: Secondary | ICD-10-CM

## 2018-02-13 MED ORDER — PREDNISONE 20 MG PO TABS: 20.0000 mg | ORAL_TABLET | Freq: Two times a day (BID) | ORAL | 0 refills | Status: DC

## 2018-02-13 MED ORDER — AZITHROMYCIN 250 MG PO TABS: ORAL_TABLET | ORAL | 0 refills | Status: DC

## 2018-02-13 NOTE — Progress Notes (Signed)
Patient: James [name] Male    DOB: 02-Dec-1929   84 y.o.   MRN: [id number] Visit Date: 02/13/2018  Today's Provider: Lelon Huh, MD   Chief Complaint  Patient presents with  . Follow-up   Subjective:    HPI  COPD with acute exacerbation ([place]) From 01/11/2018-given predniSONE (DELTASONE) 10 MG tablet, taper due to Bronchitis and exacerbation.  Call if symptoms change or if not rapidly improving.   Patient states his chronic cough has increased over the last few day. Cough is slightly productive.  He had lung biopsy yesterday which he states went well without complication. His case is scheduled to be discussed at Tumor Board tomorrow.    Allergies  Allergen Reactions  . Indomethacin Nausea Only     Current Outpatient Medications:  .  albuterol (PROAIR HFA) 108 (90 Base) MCG/ACT inhaler, INHALE 2 PUFFS INTO LUNGS EVERY 6 HOURS AS NEEDED FOR WHEEZING OR SHORTNESS OF BREATH, Disp: 8.5 g, Rfl: 5 .  apixaban (ELIQUIS) 5 MG TABS tablet, Take 1 tablet (5 mg total) by mouth 2 (two) times daily., Disp: 60 tablet, Rfl: 5 .  atorvastatin (LIPITOR) 80 MG tablet, TAKE 1 TABLET BY MOUTH DAILY, Disp: 90 tablet, Rfl: 4 .  azelastine (OPTIVAR) 0.05 % ophthalmic solution, APPLY 1 DROP TO EYE TWICE DAILY, Disp: 6 mL, Rfl: 5 .  benazepril-hydrochlorthiazide (LOTENSIN HCT) 20-12.5 MG tablet, TAKE ONE TABLET BY MOUTH EVERY DAY, Disp: 90 tablet, Rfl: 3 .  beta carotene w/minerals (OCUVITE) tablet, Take 1 tablet by mouth daily., Disp: , Rfl:  .  docusate sodium (COLACE) 100 MG capsule, Take 1 capsule (100 mg total) by mouth 2 (two) times daily. (Patient taking differently: Take 100 mg 2 (two) times daily as needed by mouth for mild constipation. ), Disp: 60 capsule, Rfl: 0 .  fluticasone (FLONASE) 50 MCG/ACT nasal spray, USE 2 SPRAYS IN EACH NOSTRIL DAILY, Disp: 16 g, Rfl: 5 .  HYDROcodone-acetaminophen (NORCO) 10-325 MG tablet, Take 1 tablet by mouth every 6 (six) hours as needed., Disp: 30  tablet, Rfl: 0 .  lansoprazole (PREVACID) 30 MG capsule, TAKE 1 CAPSULE BY MOUTH EVERY DAY, Disp: 30 capsule, Rfl: 12 .  LORazepam (ATIVAN) 1 MG tablet, Take 0.5-1 tablets (0.5-1 mg total) by mouth at bedtime as needed for sleep., Disp: 30 tablet, Rfl: 4 .  Magnesium 400 MG CAPS, Take 1 capsule by mouth daily. , Disp: , Rfl:  .  metoprolol tartrate (LOPRESSOR) 50 MG tablet, TAKE ONE TABLET BY MOUTH TWICE DAILY, Disp: 180 tablet, Rfl: 4 .  montelukast (SINGULAIR) 10 MG tablet, TAKE ONE TABLET BY MOUTH EVERY DAY, Disp: 30 tablet, Rfl: 12 .  MULTIPLE VITAMIN PO, Take 1 tablet by mouth daily. , Disp: , Rfl:  .  Omega-3 Fatty Acids (FISH OIL) 1000 MG CAPS, Take 1 capsule daily by mouth., Disp: , Rfl:  .  SPIRIVA HANDIHALER 18 MCG inhalation capsule, INHALE THE CONTENTS OF ONE CAPSULE AS DIRECTED ONCE DAILY, Disp: 30 capsule, Rfl: 12 .  SYMBICORT 160-4.5 MCG/ACT inhaler, INHALE 2 PUFFS INTO THE LUNGS TWICE DAILY, Disp: 10.2 g, Rfl: 12 .  tamsulosin (FLOMAX) 0.4 MG CAPS capsule, TAKE 1 CAPSULE BY MOUTH DAILY, Disp: 90 capsule, Rfl: 3 .  tiZANidine (ZANAFLEX) 4 MG tablet, TAKE ONE TABLET AT BEDTIME, Disp: 30 tablet, Rfl: 12  Review of Systems  Constitutional: Negative for appetite change, chills and fever.  Respiratory: Positive for cough. Negative for chest tightness, shortness of breath and  wheezing.   Cardiovascular: Negative for chest pain and palpitations.  Gastrointestinal: Negative for abdominal pain, nausea and vomiting.    Social History   Tobacco Use  . Smoking status: Former Smoker    Packs/day: 1.00    Years: 11.00    Pack years: 11.00    Types: Cigarettes    Last attempt to quit: 02/12/1984    Years since quitting: 34.0  . Smokeless tobacco: Never Used  . Tobacco comment: quit smoking 30 yrs ago  Substance Use Topics  . Alcohol use: Yes    Alcohol/week: 0.0 oz    Comment: beer occasionally   Objective:   BP (!) 120/58 (BP Location: Right Arm, Patient Position: Sitting, Cuff  Size: Normal)   Pulse 61   Temp 97.7 F (36.5 C) (Oral)   Resp 16   Ht 5\' 8"  (1.727 m)   Wt 199 lb (90.3 kg)   SpO2 97%   BMI 30.26 kg/m  Vitals:   02/13/18 1104  BP: (!) 120/58  Pulse: 61  Resp: 16  Temp: 97.7 F (36.5 C)  TempSrc: Oral  SpO2: 97%  Weight: 199 lb (90.3 kg)  Height: 5\' 8"  (1.727 m)     Physical Exam  General Appearance:    Alert, cooperative, no distress  HENT:   ENT exam normal, no neck nodes or sinus tenderness  Eyes:    PERRL, conjunctiva/corneas clear, EOM's intact       Lungs:     Occasional expiratory wheeze, no rales,  respirations unlabored  Heart:    Regular rate and rhythm  Neurologic:   Awake, alert, oriented x 3. No apparent focal neurological           defect.           Assessment & Plan:     1. Cough Had mostly resolved until flaring about 2 days ago. Start antibiotic and prednisone as below.   2. Bronchitis  - predniSONE (DELTASONE) 20 MG tablet; Take 1 tablet (20 mg total) by mouth 2 (two) times daily with a meal for 5 days.  Dispense: 10 tablet; Refill: 0 - azithromycin (ZITHROMAX) 250 MG tablet; 2 by mouth today, then 1 daily for 4 days  Dispense: 6 tablet; Refill: 0       Lelon Huh, MD  [facility]

## 2018-02-14 ENCOUNTER — Other Ambulatory Visit: Payer: Self-pay | Admitting: *Deleted

## 2018-02-14 ENCOUNTER — Telehealth: Payer: Self-pay | Admitting: *Deleted

## 2018-02-14 DIAGNOSIS — R918 Other nonspecific abnormal finding of lung field: Secondary | ICD-10-CM

## 2018-02-14 NOTE — Telephone Encounter (Signed)
-----   Message from Laverle Hobby, MD sent at 02/14/2018  2:08 PM EST ----- Regarding: Refer to oncology and Thoracic surgery Pt was discussed at lung cancer conference and needs referral to Dr. Genevive Bi and oncology.

## 2018-02-16 DIAGNOSIS — C3431 Malignant neoplasm of lower lobe, right bronchus or lung: Secondary | ICD-10-CM | POA: Insufficient documentation

## 2018-02-16 NOTE — Progress Notes (Signed)
[place]  [phone] [phone]  ID: James [name] OB: 05/28/1929  MR#: [id number]  GGY#:[id number]  Patient Care Team: Birdie Sons, MD as PCP - General (Family Medicine) Abbie Sons, MD (Urology) Ubaldo Glassing Javier Docker, MD as Consulting Physician (Cardiology) Telford Nab, RN as Registered Nurse  CHIEF COMPLAINT: Clinical stage IIB adenocarcinoma of the right lower lobe lung.  INTERVAL HISTORY: Patient is an 84 year old male who has been closely followed for slowly enlarging right lower lobe lung nodule.  Recently, biopsy was performed which confirmed adenocarcinoma.  He currently feels well and at his baseline.  He has no neurologic complaints.  He denies any recent fevers or illnesses.  He has a good appetite and denies weight loss.  He denies any chest pain, shortness of breath, cough, or hemoptysis.  He has no nausea, vomiting, constipation, or diarrhea.  He has no urinary complaints.  Patient offers no specific complaints today.  REVIEW OF SYSTEMS:   Review of Systems  Constitutional: Negative.  Negative for fever, malaise/fatigue and weight loss.  Respiratory: Negative.  Negative for cough, hemoptysis and shortness of breath.   Cardiovascular: Negative.  Negative for chest pain and leg swelling.  Gastrointestinal: Negative.  Negative for abdominal pain.  Genitourinary: Negative.   Musculoskeletal: Negative.   Skin: Negative.  Negative for rash.  Neurological: Negative.  Negative for sensory change and weakness.  Psychiatric/Behavioral: Negative.  The patient is not nervous/anxious.     As per HPI. Otherwise, a complete review of systems is negative.  PAST MEDICAL HISTORY: Past Medical History:  Diagnosis Date  . Arthritis   . Back pain   . CAD (coronary artery disease)   . Cataract    right  . COPD (chronic obstructive pulmonary disease) ([place])    SPiriva and SYmbicort daily. Albuterol as needed  . Diverticulosis   .  Dyspnea    with exertion  . Enlarged prostate    takes Flomax daily  . GERD (gastroesophageal reflux disease)   . History of chicken pox   . History of gout   . History of measles   . History of mumps   . HOH (hard of hearing)   . Hyperglycemia   . Hyperlipidemia    takes Atorvastatin daily  . Hypertension    takes Metoprolol daily as well as Lotensin HCT  . Hypotension   . Joint pain   . Microscopic colitis   . PAF (paroxysmal atrial fibrillation) ([place]), RVR 03/14/2017  . Pneumonia   . Prostate cancer ([place])   . Stroke [facility])    TIA  . TIA (transient ischemic attack)   . Weakness    numbness and tingling.mainly on right    PAST SURGICAL HISTORY: Past Surgical History:  Procedure Laterality Date  . ANTERIOR CERVICAL DECOMP/DISCECTOMY FUSION N/A 03/07/2017   Procedure: ANTERIOR CERVICAL DECOMPRESSION/DISCECTOMY FUSION CERVICAL THREE- CERVICAL FOUR, CERVICAL FOUR- CERVICAL FIVE;  Surgeon: Newman Pies, MD;  Location: [facility];  Service: Neurosurgery;  Laterality: N/A;  ANTERIOR CERVICAL DECOMPRESSION/DISCECTOMY FUSION CERVICAL 3- CERVICAL 4, CERVIACL 4- CERVICAL 5  . APPENDECTOMY  1950  . CARDIAC CATHETERIZATION  05/29/2013   EF=40-45%. Moderate pulmonary hypertension. Infero/ lateral hypokinesis  . cataract surgery Left   . COLONOSCOPY    . CORONARY ARTERY BYPASS GRAFT  2011   x 5  . FLEXIBLE BRONCHOSCOPY N/A 11/19/2017   Procedure: FLEXIBLE BRONCHOSCOPY;  Surgeon: Laverle Hobby, MD;  Location: [facility];  Service: Pulmonary;  Laterality: N/A;  . MRI BRAIN  06/07/2013   Mild chronic involutional changes. No acute abnormalities  . MRI of neck    . Myocardial Perfusion Scan  05/29/2013   Abnormal myocardial perfusion image consistent with myocardial infarction  . PROSTATE SURGERY  11/18/2012   radiation seed  . TOTAL HIP ARTHROPLASTY Left 1997   hip joint replacement    FAMILY HISTORY: Family History  Problem Relation Age of Onset  . Heart attack Mother   .  Stroke Father   . Heart attack Father   . Hypertension Father   . Heart attack Sister   . Bladder Cancer Brother   . Kidney cancer Brother   . Heart Problems Brother     ADVANCED DIRECTIVES (Y/N):  N  HEALTH MAINTENANCE: Social History   Tobacco Use  . Smoking status: Former Smoker    Packs/day: 1.00    Years: 11.00    Pack years: 11.00    Types: Cigarettes    Last attempt to quit: 02/12/1984    Years since quitting: 34.0  . Smokeless tobacco: Never Used  . Tobacco comment: quit smoking 30 yrs ago  Substance Use Topics  . Alcohol use: Yes    Alcohol/week: 0.0 oz    Comment: beer occasionally  . Drug use: No     Colonoscopy:  PAP:  Bone density:  Lipid panel:  Allergies  Allergen Reactions  . Indomethacin Nausea Only    Current Outpatient Medications  Medication Sig Dispense Refill  . albuterol (PROAIR HFA) 108 (90 Base) MCG/ACT inhaler INHALE 2 PUFFS INTO LUNGS EVERY 6 HOURS AS NEEDED FOR WHEEZING OR SHORTNESS OF BREATH 8.5 g 5  . apixaban (ELIQUIS) 5 MG TABS tablet Take 1 tablet (5 mg total) by mouth 2 (two) times daily. 60 tablet 5  . atorvastatin (LIPITOR) 80 MG tablet TAKE 1 TABLET BY MOUTH DAILY 90 tablet 4  . azelastine (OPTIVAR) 0.05 % ophthalmic solution APPLY 1 DROP TO EYE TWICE DAILY 6 mL 5  . benazepril-hydrochlorthiazide (LOTENSIN HCT) 20-12.5 MG tablet TAKE ONE TABLET BY MOUTH EVERY DAY 90 tablet 3  . beta carotene w/minerals (OCUVITE) tablet Take 1 tablet by mouth daily.    . chlorpheniramine-HYDROcodone (TUSSIONEX) 10-8 MG/5ML SUER TAKE 5 MLS (ONE TEASPOON) EVERY 12 HOURSAS NEEDED FOR COUGH 115 mL 0  . docusate sodium (COLACE) 100 MG capsule Take 1 capsule (100 mg total) by mouth 2 (two) times daily. (Patient taking differently: Take 100 mg 2 (two) times daily as needed by mouth for mild constipation. ) 60 capsule 0  . fluticasone (FLONASE) 50 MCG/ACT nasal spray USE 2 SPRAYS IN EACH NOSTRIL DAILY 16 g 5  . HYDROcodone-acetaminophen (NORCO) 10-325 MG  tablet Take 1 tablet by mouth every 6 (six) hours as needed. 30 tablet 0  . lansoprazole (PREVACID) 30 MG capsule TAKE 1 CAPSULE BY MOUTH EVERY DAY 30 capsule 12  . LORazepam (ATIVAN) 1 MG tablet Take 0.5-1 tablets (0.5-1 mg total) by mouth at bedtime as needed for sleep. 30 tablet 4  . Magnesium 400 MG CAPS Take 1 capsule by mouth daily.     . metoprolol tartrate (LOPRESSOR) 50 MG tablet TAKE ONE TABLET BY MOUTH TWICE DAILY 180 tablet 4  . montelukast (SINGULAIR) 10 MG tablet TAKE ONE TABLET BY MOUTH EVERY DAY 30 tablet 12  . MULTIPLE VITAMIN PO Take 1 tablet by mouth daily.     . Omega-3 Fatty Acids (FISH OIL) 1000 MG CAPS Take 1 capsule daily by mouth.    . SPIRIVA HANDIHALER 18 MCG  inhalation capsule INHALE THE CONTENTS OF ONE CAPSULE AS DIRECTED ONCE DAILY 30 capsule 12  . SYMBICORT 160-4.5 MCG/ACT inhaler INHALE 2 PUFFS INTO THE LUNGS TWICE DAILY 10.2 g 12  . tamsulosin (FLOMAX) 0.4 MG CAPS capsule TAKE 1 CAPSULE BY MOUTH DAILY 90 capsule 3   No current facility-administered medications for this visit.     OBJECTIVE: Vitals:   02/19/18 1509  BP: (!) 164/81  Pulse: (!) 109  Resp: 18  Temp: 98.7 F (37.1 C)  SpO2: 98%     Body mass index is 30.21 kg/m.    ECOG FS:0 - Asymptomatic  General: Well-developed, well-nourished, no acute distress. Eyes: Pink conjunctiva, anicteric sclera. HEENT: Normocephalic, moist mucous membranes, clear oropharnyx. Lungs: Clear to auscultation bilaterally. Heart: Regular rate and rhythm. No rubs, murmurs, or gallops. Abdomen: Soft, nontender, nondistended. No organomegaly noted, normoactive bowel sounds. Musculoskeletal: No edema, cyanosis, or clubbing. Neuro: Alert, answering all questions appropriately. Cranial nerves grossly intact. Skin: No rashes or petechiae noted. Psych: Normal affect. Lymphatics: No cervical, calvicular, axillary or inguinal LAD.   LAB RESULTS:  Lab Results  Component Value Date   NA 132 (L) 11/08/2017   K 3.9  11/08/2017   CL 101 11/08/2017   CO2 26 11/08/2017   GLUCOSE 115 (H) 11/08/2017   BUN 14 11/08/2017   CREATININE 0.92 11/08/2017   CALCIUM 8.2 (L) 11/08/2017   PROT 6.8 11/06/2017   ALBUMIN 3.9 11/06/2017   AST 30 11/06/2017   ALT 28 11/06/2017   ALKPHOS 80 11/06/2017   BILITOT 0.9 11/06/2017   GFRNONAA >60 11/08/2017   GFRAA >60 11/08/2017    Lab Results  Component Value Date   WBC 5.6 24-Feb-2018   NEUTROABS 5.8 10/30/2017   HGB 14.3 2018-02-24   HCT 43.8 Feb 24, 2018   MCV 88.9 02/24/2018   PLT 181 2018-02-24     STUDIES: Ct Biopsy  Result Date: February 24, 2018 INDICATION: Persistent posterior right lower lobe masslike opacity with low PET activity. Abnormality remains indeterminate. EXAM: CT-GUIDED BIOPSY RIGHT LOWER LOBE MASSLIKE ABNORMALITY MEDICATIONS: 1% lidocaine local ANESTHESIA/SEDATION: 0 point mg IV Versed; 25 mcg IV Fentanyl Moderate Sedation Time:  13 minutes The patient was continuously monitored during the procedure by the interventional radiology nurse under my direct supervision. PROCEDURE: The procedure, risks, benefits, and alternatives were explained to the patient. Questions regarding the procedure were encouraged and answered. The patient understands and consents to the procedure. Previous imaging reviewed. Patient positioned right side down decubitus. Noncontrast localization CT performed. The posterior right lower lobe masslike opacity was localized. Overlying skin marked for a posterior approach. Under sterile conditions and local anesthesia, a 17 gauge 6.8 cm guide needle was advanced from posterior approach to the abnormality. Needle position confirmed with CT. 1 cm 18 gauge core biopsies obtained. Samples placed in formalin. Needle tract occlusion performed with the Biosentry occlusion device. Postprocedure imaging demonstrates no hemorrhage or hematoma. No pneumothorax. Patient tolerated the procedure well without complication. Vital sign monitoring by nursing  staff during the procedure will continue as patient is in the special procedures unit for post procedure observation. FINDINGS: The images document guide needle placement within the posterior right lower lobe masslike at. Post biopsy images demonstrate no effusion or pneumothorax. COMPLICATIONS: None immediate. IMPRESSION: Successful CT-guided core biopsy of the persistent posterior right lower lobe masslike abnormality. Electronically Signed   By: Jerilynn Mages.  Shick M.D.   On: 02-24-18 12:44   Dg Chest Port 1 View  Result Date: February 24, 2018 CLINICAL DATA:  Post biopsy  right lower lobe masslike opacity EXAM: PORTABLE CHEST 1 VIEW COMPARISON:  02/11/2018 FINDINGS: Low volume exam with increased basilar atelectasis. Right hemidiaphragm is elevated. No significant pneumothorax or effusion following right lower lobe posterior mass biopsy. Coronary bypass changes noted. Stable heart size vascularity. Trachea is midline. Atherosclerosis of aorta. IMPRESSION: No pneumothorax or large effusion following right lower lobe biopsy. Electronically Signed   By: Jerilynn Mages.  Shick M.D.   On: 02/11/2018 13:19    ASSESSMENT: Clinical stage IIB adenocarcinoma of the right lower lobe lung.  PLAN:    1. Clinical stage IIB adenocarcinoma of the right lower lobe lung: Imaging and biopsy results reviewed independently.  Patient is not a surgical candidate.  Given his advanced age, chemotherapy is not recommended.  Patient was given a referral to radiation oncology for further evaluation and consideration of XRT for local control of disease.  Patient will require repeat CT scan in approximately 3-4 months after completion of his radiation treatments.  Follow-up will be determined based on his treatment schedule.  Approximately 60 minutes was spent in discussion of which greater than 50% was consultation.  Patient expressed understanding and was in agreement with this plan. He also understands that He can call clinic at any time with any  questions, concerns, or complaints.   Cancer Staging Primary adenocarcinoma of lower lobe of right lung [facility]) Staging form: Lung, AJCC 8th Edition - Clinical stage from 02/16/2018: Stage IIB (cT3, cN0, cM0) - Signed by Lloyd Huger, MD on 02/16/2018   Lloyd Huger, MD   02/22/2018 10:52 AM

## 2018-02-18 ENCOUNTER — Encounter: Payer: Self-pay | Admitting: *Deleted

## 2018-02-18 ENCOUNTER — Ambulatory Visit (INDEPENDENT_AMBULATORY_CARE_PROVIDER_SITE_OTHER): Payer: Medicare Other | Admitting: Cardiothoracic Surgery

## 2018-02-18 ENCOUNTER — Encounter: Payer: Self-pay | Admitting: Cardiothoracic Surgery

## 2018-02-18 VITALS — BP 115/71 | HR 71 | Temp 97.5°F | Resp 20 | Ht 68.0 in | Wt 196.2 lb

## 2018-02-18 DIAGNOSIS — R918 Other nonspecific abnormal finding of lung field: Secondary | ICD-10-CM

## 2018-02-18 NOTE — Progress Notes (Signed)
Patient ID: James [name], male   DOB: 04/12/1929, 84 y.o.   MRN: [id number]  Chief Complaint  Patient presents with  . New Patient (Initial Visit)    Lung Mass    Referred By Dr. Ashby Dawes Reason for Referral right lower lobe mass  HPI Location, Quality, Duration, Severity, Timing, Context, Modifying Factors, Associated Signs and Symptoms.  James [name] is a 84 y.o. male.  He was found to have a right lower lobe mass about a year ago.  At that time the lesion was thought to represent rounded atelectasis and was associated with an elevation of his right hemidiaphragm.  The patient did undergo coronary artery bypass grafting in 2011 at [facility] and he describes what is likely to be a sniff test preoperatively to assess his diaphragm function.  He states that the time that he was told he had 96% function compared to the left.  I do not know what to make of that information but in any event the lesion that was identified in the right lower lobe was perhaps thought to represent rounded atelectasis and this was followed over the last year.  At some point he underwent a bronchoscopy and a biopsy which was nondiagnostic but a recent CT-guided needle biopsy did confirm the presence of adenocarcinoma.  The patient states that he is not had any weight loss.  He does not wear oxygen.  He does get short of breath with minimal activities.  His wife states that he is unable to get dressed without being short of breath.  The patient states that he could walk up a flight of stairs but he must do so very slowly and he would have to stop on multiple occasions.  He has not had any formal pulmonary function test done that I can find in the chart.  He has not had any hemoptysis, fever or sputum production.   Past Medical History:  Diagnosis Date  . Arthritis   . Back pain   . CAD (coronary artery disease)   . Cataract    right  . COPD (chronic obstructive pulmonary disease) ([place])    SPiriva and SYmbicort  daily. Albuterol as needed  . Diverticulosis   . Dyspnea    with exertion  . Enlarged prostate    takes Flomax daily  . GERD (gastroesophageal reflux disease)   . History of chicken pox   . History of gout   . History of measles   . History of mumps   . HOH (hard of hearing)   . Hyperglycemia   . Hyperlipidemia    takes Atorvastatin daily  . Hypertension    takes Metoprolol daily as well as Lotensin HCT  . Hypotension   . Joint pain   . Microscopic colitis   . PAF (paroxysmal atrial fibrillation) ([place]), RVR 03/14/2017  . Pneumonia   . Prostate cancer ([place])   . Stroke [facility])    TIA  . TIA (transient ischemic attack)   . Weakness    numbness and tingling.mainly on right    Past Surgical History:  Procedure Laterality Date  . ANTERIOR CERVICAL DECOMP/DISCECTOMY FUSION N/A 03/07/2017   Procedure: ANTERIOR CERVICAL DECOMPRESSION/DISCECTOMY FUSION CERVICAL THREE- CERVICAL FOUR, CERVICAL FOUR- CERVICAL FIVE;  Surgeon: Newman Pies, MD;  Location: [place];  Service: Neurosurgery;  Laterality: N/A;  ANTERIOR CERVICAL DECOMPRESSION/DISCECTOMY FUSION CERVICAL 3- CERVICAL 4, CERVIACL 4- CERVICAL 5  . APPENDECTOMY  1950  . CARDIAC CATHETERIZATION  05/29/2013   EF=40-45%. Moderate pulmonary hypertension.  Infero/ lateral hypokinesis  . cataract surgery Left   . COLONOSCOPY    . CORONARY ARTERY BYPASS GRAFT  2011   x 5  . FLEXIBLE BRONCHOSCOPY N/A 11/19/2017   Procedure: FLEXIBLE BRONCHOSCOPY;  Surgeon: Laverle Hobby, MD;  Location: [facility];  Service: Pulmonary;  Laterality: N/A;  . MRI BRAIN  06/07/2013   Mild chronic involutional changes. No acute abnormalities  . MRI of neck    . Myocardial Perfusion Scan  05/29/2013   Abnormal myocardial perfusion image consistent with myocardial infarction  . PROSTATE SURGERY  11/18/2012   radiation seed  . TOTAL HIP ARTHROPLASTY Left 1997   hip joint replacement    Family History  Problem Relation Age of Onset  . Heart attack  Mother   . Stroke Father   . Heart attack Father   . Hypertension Father   . Heart attack Sister   . Bladder Cancer Brother   . Kidney cancer Brother   . Heart Problems Brother     Social History Social History   Tobacco Use  . Smoking status: Former Smoker    Packs/day: 1.00    Years: 11.00    Pack years: 11.00    Types: Cigarettes    Last attempt to quit: 02/12/1984    Years since quitting: 34.0  . Smokeless tobacco: Never Used  . Tobacco comment: quit smoking 30 yrs ago  Substance Use Topics  . Alcohol use: Yes    Alcohol/week: 0.0 oz    Comment: beer occasionally  . Drug use: No    Allergies  Allergen Reactions  . Indomethacin Nausea Only    Current Outpatient Medications  Medication Sig Dispense Refill  . albuterol (PROAIR HFA) 108 (90 Base) MCG/ACT inhaler INHALE 2 PUFFS INTO LUNGS EVERY 6 HOURS AS NEEDED FOR WHEEZING OR SHORTNESS OF BREATH 8.5 g 5  . apixaban (ELIQUIS) 5 MG TABS tablet Take 1 tablet (5 mg total) by mouth 2 (two) times daily. 60 tablet 5  . atorvastatin (LIPITOR) 80 MG tablet TAKE 1 TABLET BY MOUTH DAILY 90 tablet 4  . azelastine (OPTIVAR) 0.05 % ophthalmic solution APPLY 1 DROP TO EYE TWICE DAILY 6 mL 5  . benazepril-hydrochlorthiazide (LOTENSIN HCT) 20-12.5 MG tablet TAKE ONE TABLET BY MOUTH EVERY DAY 90 tablet 3  . beta carotene w/minerals (OCUVITE) tablet Take 1 tablet by mouth daily.    . chlorpheniramine-HYDROcodone (TUSSIONEX) 10-8 MG/5ML SUER TAKE 5 MLS (ONE TEASPOON) EVERY 12 HOURSAS NEEDED FOR COUGH 115 mL 0  . docusate sodium (COLACE) 100 MG capsule Take 1 capsule (100 mg total) by mouth 2 (two) times daily. (Patient taking differently: Take 100 mg 2 (two) times daily as needed by mouth for mild constipation. ) 60 capsule 0  . fluticasone (FLONASE) 50 MCG/ACT nasal spray USE 2 SPRAYS IN EACH NOSTRIL DAILY 16 g 5  . HYDROcodone-acetaminophen (NORCO) 10-325 MG tablet Take 1 tablet by mouth every 6 (six) hours as needed. 30 tablet 0  .  lansoprazole (PREVACID) 30 MG capsule TAKE 1 CAPSULE BY MOUTH EVERY DAY 30 capsule 12  . LORazepam (ATIVAN) 1 MG tablet Take 0.5-1 tablets (0.5-1 mg total) by mouth at bedtime as needed for sleep. 30 tablet 4  . Magnesium 400 MG CAPS Take 1 capsule by mouth daily.     . metoprolol tartrate (LOPRESSOR) 50 MG tablet TAKE ONE TABLET BY MOUTH TWICE DAILY 180 tablet 4  . montelukast (SINGULAIR) 10 MG tablet TAKE ONE TABLET BY MOUTH EVERY DAY 30 tablet 12  .  MULTIPLE VITAMIN PO Take 1 tablet by mouth daily.     . Omega-3 Fatty Acids (FISH OIL) 1000 MG CAPS Take 1 capsule daily by mouth.    . SPIRIVA HANDIHALER 18 MCG inhalation capsule INHALE THE CONTENTS OF ONE CAPSULE AS DIRECTED ONCE DAILY 30 capsule 12  . SYMBICORT 160-4.5 MCG/ACT inhaler INHALE 2 PUFFS INTO THE LUNGS TWICE DAILY 10.2 g 12  . tamsulosin (FLOMAX) 0.4 MG CAPS capsule TAKE 1 CAPSULE BY MOUTH DAILY 90 capsule 3   No current facility-administered medications for this visit.       Review of Systems A complete review of systems was asked and was negative except for the following positive findings cough, shortness of breath, wheezing, bruising.  Blood pressure 115/71, pulse 71, temperature (!) 97.5 F (36.4 C), temperature source Oral, resp. rate 20, height _0  (1.727 m), weight 196 lb 3.2 oz (89 kg), SpO2 93 %.  Physical Exam CONSTITUTIONAL:  Pleasant, well-developed, well-nourished, and in no acute distress. EYES: Pupils equal and reactive to light, Sclera non-icteric EARS, NOSE, MOUTH AND THROAT:  The oropharynx was clear.  Dentition is good repair.  Oral mucosa pink and moist. LYMPH NODES:  Lymph nodes in the neck and axillae were normal RESPIRATORY:  Lungs were clear.  Normal respiratory effort without pathologic use of accessory muscles of respiration.  Breath sounds were diminished at the right base. CARDIOVASCULAR: Heart was regular without murmurs.  There were no carotid bruits. GI: The abdomen was soft, nontender, and  nondistended. There were no palpable masses. There was no hepatosplenomegaly. There were normal bowel sounds in all quadrants. GU:  Rectal deferred.   MUSCULOSKELETAL:  Normal muscle strength and tone.  No clubbing or cyanosis.  There is a well-healed sternotomy scar. SKIN:  There were no pathologic skin lesions.  There were no nodules on palpation. NEUROLOGIC:  Sensation is normal.  Cranial nerves are grossly intact. PSYCH:  Oriented to person, place and time.  Mood and affect are normal.  Data Reviewed CT scans and PET scan.  I have personally reviewed the patient's imaging, laboratory findings and medical records.    Assessment    I have independently reviewed the patient's CT scan.  There is elevation of the right hemidiaphragm with a peripherally located right lower lobe mass.  This measures about 6-7 cm in size.  It certainly is consistent with an adenocarcinoma.    Plan    I had a long discussion with he and his wife today regarding the options.  They do not feel that he would be a candidate for surgery based upon his shortness of breath and his age.  I will make an appointment for them to see Dr. Grayland Ormond and Dr. Donella Stade in follow-up.  Once the oncologist and radiation therapist of seeing the patient we will then reconvene to make appropriate recommendations.       Nestor Lewandowsky, MD 02/18/2018, 11:30 AM

## 2018-02-18 NOTE — Patient Instructions (Signed)
Please keep your appointment tomorrow with Dr.Finnegan.  We will send the referral to [facility] from their office will call to schedule an appointment within 5-7 days.   If you do not hear from anyone please let us know and we can check on this for you.

## 2018-02-18 NOTE — Progress Notes (Signed)
  Oncology Nurse Navigator Documentation  Navigator Location: [facility] (02/18/18 0800) Referral date to RadOnc/MedOnc: 02/14/18 (02/18/18 0800) )Navigator Encounter Type: Introductory phone call;Telephone (02/18/18 0800) Telephone: [facility] Call;Appt Confirmation/Clarification (02/18/18 0800)   Confirmed Diagnosis Date: 02/12/18 (02/18/18 0800)                   Barriers/Navigation Needs: Coordination of Care (02/18/18 0800)   Interventions: Coordination of Care (02/18/18 0800)   Coordination of Care: Appts (02/18/18 0800)        Acuity: Level 1 (02/18/18 0800) Acuity Level 1: Initial guidance, education and coordination as needed;Minimal follow up required (02/18/18 0800)  phone call made to patient to give new patient appointment scheduled on Tues 3/5 at 3pm with Dr. Grayland Ormond. Pt left message this morning to confirm appt on 3/5. No further questions or needs at this time.      Time Spent with Patient: 30 (02/18/18 0800)

## 2018-02-19 ENCOUNTER — Inpatient Hospital Stay: Payer: Medicare Other | Attending: Oncology | Admitting: Oncology

## 2018-02-19 ENCOUNTER — Encounter: Payer: Self-pay | Admitting: *Deleted

## 2018-02-19 ENCOUNTER — Encounter: Payer: Self-pay | Admitting: Oncology

## 2018-02-19 DIAGNOSIS — C3431 Malignant neoplasm of lower lobe, right bronchus or lung: Secondary | ICD-10-CM | POA: Diagnosis not present

## 2018-02-20 NOTE — Progress Notes (Signed)
  Oncology Nurse Navigator Documentation  Navigator Location: [facility] (02/19/18 1600)   )Navigator Encounter Type: Initial MedOnc (02/19/18 1600)                       Treatment Phase: Pre-Tx/Tx Discussion (02/19/18 1600) Barriers/Navigation Needs: Coordination of Care;Education (02/19/18 1600) Education: Understanding Cancer/ Treatment Options;Newly Diagnosed Cancer Education (02/19/18 1600) Interventions: Coordination of Care;Education (02/19/18 1600)   Coordination of Care: Appts (02/19/18 1600) Education Method: Written (02/19/18 1600)       met with patient and his wife during med-onc consultation with Dr. Grayland Ormond to discuss treatment planning for newly diagnosed lung cancer. All questions answered at the time of visit. Pt given materials regarding diagnosis and supportive services available. Reviewed upcoming appts with pt and wife. Contact info given and instructed to call with any further questions or needs. Per Dr. Grayland Ormond, pt will require CT chest 3 months after completing radiation treatment. Will follow up and coordinate appts once radiation is completed. Nothing further needed at this time.          Time Spent with Patient: 60 (02/19/18 1600)

## 2018-02-21 ENCOUNTER — Ambulatory Visit
Admission: RE | Admit: 2018-02-21 | Discharge: 2018-02-21 | Disposition: A | Discharge status: Home / Self Care | Payer: Medicare Other | Source: Ambulatory Visit | Attending: Radiation Oncology | Admitting: Radiation Oncology

## 2018-02-21 DIAGNOSIS — Z87891 Personal history of nicotine dependence: Secondary | ICD-10-CM | POA: Diagnosis not present

## 2018-02-21 DIAGNOSIS — C3431 Malignant neoplasm of lower lobe, right bronchus or lung: Secondary | ICD-10-CM | POA: Diagnosis not present

## 2018-02-21 NOTE — Consult Note (Signed)
NEW PATIENT EVALUATION  Name: James [name]  MRN: [id number]  Date:   02/21/2018     DOB: 28-Oct-1929   This 84 y.o. male patient presents to the clinic for initial evaluation of right lower lobe non-small cell lung cancer. Adenocarcinoma stage IIB (T3 N0 M0) T3 by chest wall invasion  REFERRING PHYSICIAN: Birdie Sons, MD  CHIEF COMPLAINT: No chief complaint on file.   DIAGNOSIS: The encounter diagnosis was Primary cancer of right lower lobe of lung ([place]).   PREVIOUS INVESTIGATIONS:  CT scans reviewed, PET CT scan reviewed Pathology report reviewed Clinical notes reviewed  HPI: Patient is an 84 year old male well known to our department having been treated back in 2013 for adenocarcinoma the prostate receiving I-125 interstitial implant. He has been followed for a right lower lobe lung nodule which was seen about a year ago. He's had bronchoscopy which was nondiagnostic in the past although recently had a CT-guided biopsy of the mass which was positive for adenocarcinoma. He is asymptomatic specifically denies chest wall pain cough hemoptysis or chest tightness. Patient does have by CT criteria elevation of the right hemidiaphragm. The mass measures approximately 6 cm. He has been turned down for surgery by Dr. Faith Rogue and is now referred to radiation oncology for consideration of treatment. He has had a PET scan approximate 6 months prior showing hypermetabolic activity in the right lower lobe lesion no evidence of mediastinal or hilar adenopathy.  PLANNED TREATMENT REGIMEN: Possible SB RT  PAST MEDICAL HISTORY:  has a past medical history of Arthritis, Back pain, CAD (coronary artery disease), Cataract, COPD (chronic obstructive pulmonary disease) ([place]), Diverticulosis, Dyspnea, Enlarged prostate, GERD (gastroesophageal reflux disease), History of chicken pox, History of gout, History of measles, History of mumps, HOH (hard of hearing), Hyperglycemia, Hyperlipidemia, Hypertension,  Hypotension, Joint pain, Microscopic colitis, PAF (paroxysmal atrial fibrillation) ([place]), RVR (03/14/2017), Pneumonia, Prostate cancer ([place]), Stroke ([place]), TIA (transient ischemic attack), and Weakness.    PAST SURGICAL HISTORY:  Past Surgical History:  Procedure Laterality Date  . ANTERIOR CERVICAL DECOMP/DISCECTOMY FUSION N/A 03/07/2017   Procedure: ANTERIOR CERVICAL DECOMPRESSION/DISCECTOMY FUSION CERVICAL THREE- CERVICAL FOUR, CERVICAL FOUR- CERVICAL FIVE;  Surgeon: Newman Pies, MD;  Location: [facility];  Service: Neurosurgery;  Laterality: N/A;  ANTERIOR CERVICAL DECOMPRESSION/DISCECTOMY FUSION CERVICAL 3- CERVICAL 4, CERVIACL 4- CERVICAL 5  . APPENDECTOMY  1950  . CARDIAC CATHETERIZATION  05/29/2013   EF=40-45%. Moderate pulmonary hypertension. Infero/ lateral hypokinesis  . cataract surgery Left   . COLONOSCOPY    . CORONARY ARTERY BYPASS GRAFT  2011   x 5  . FLEXIBLE BRONCHOSCOPY N/A 11/19/2017   Procedure: FLEXIBLE BRONCHOSCOPY;  Surgeon: Laverle Hobby, MD;  Location: [facility];  Service: Pulmonary;  Laterality: N/A;  . MRI BRAIN  06/07/2013   Mild chronic involutional changes. No acute abnormalities  . MRI of neck    . Myocardial Perfusion Scan  05/29/2013   Abnormal myocardial perfusion image consistent with myocardial infarction  . PROSTATE SURGERY  11/18/2012   radiation seed  . TOTAL HIP ARTHROPLASTY Left 1997   hip joint replacement    FAMILY HISTORY: family history includes Bladder Cancer in his brother; Heart Problems in his brother; Heart attack in his father, mother, and sister; Hypertension in his father; Kidney cancer in his brother; Stroke in his father.  SOCIAL HISTORY:  reports that he quit smoking about 34 years ago. His smoking use included cigarettes. He has a 11.00 pack-year smoking history. he has never used smokeless tobacco. He reports  that he drinks alcohol. He reports that he does not use drugs.  ALLERGIES: Indomethacin  MEDICATIONS:  Current  Outpatient Medications  Medication Sig Dispense Refill  . albuterol (PROAIR HFA) 108 (90 Base) MCG/ACT inhaler INHALE 2 PUFFS INTO LUNGS EVERY 6 HOURS AS NEEDED FOR WHEEZING OR SHORTNESS OF BREATH 8.5 g 5  . apixaban (ELIQUIS) 5 MG TABS tablet Take 1 tablet (5 mg total) by mouth 2 (two) times daily. 60 tablet 5  . atorvastatin (LIPITOR) 80 MG tablet TAKE 1 TABLET BY MOUTH DAILY 90 tablet 4  . azelastine (OPTIVAR) 0.05 % ophthalmic solution APPLY 1 DROP TO EYE TWICE DAILY 6 mL 5  . benazepril-hydrochlorthiazide (LOTENSIN HCT) 20-12.5 MG tablet TAKE ONE TABLET BY MOUTH EVERY DAY 90 tablet 3  . beta carotene w/minerals (OCUVITE) tablet Take 1 tablet by mouth daily.    . chlorpheniramine-HYDROcodone (TUSSIONEX) 10-8 MG/5ML SUER TAKE 5 MLS (ONE TEASPOON) EVERY 12 HOURSAS NEEDED FOR COUGH 115 mL 0  . docusate sodium (COLACE) 100 MG capsule Take 1 capsule (100 mg total) by mouth 2 (two) times daily. (Patient taking differently: Take 100 mg 2 (two) times daily as needed by mouth for mild constipation. ) 60 capsule 0  . fluticasone (FLONASE) 50 MCG/ACT nasal spray USE 2 SPRAYS IN EACH NOSTRIL DAILY 16 g 5  . HYDROcodone-acetaminophen (NORCO) 10-325 MG tablet Take 1 tablet by mouth every 6 (six) hours as needed. 30 tablet 0  . lansoprazole (PREVACID) 30 MG capsule TAKE 1 CAPSULE BY MOUTH EVERY DAY 30 capsule 12  . LORazepam (ATIVAN) 1 MG tablet Take 0.5-1 tablets (0.5-1 mg total) by mouth at bedtime as needed for sleep. 30 tablet 4  . Magnesium 400 MG CAPS Take 1 capsule by mouth daily.     . metoprolol tartrate (LOPRESSOR) 50 MG tablet TAKE ONE TABLET BY MOUTH TWICE DAILY 180 tablet 4  . montelukast (SINGULAIR) 10 MG tablet TAKE ONE TABLET BY MOUTH EVERY DAY 30 tablet 12  . MULTIPLE VITAMIN PO Take 1 tablet by mouth daily.     . Omega-3 Fatty Acids (FISH OIL) 1000 MG CAPS Take 1 capsule daily by mouth.    . SPIRIVA HANDIHALER 18 MCG inhalation capsule INHALE THE CONTENTS OF ONE CAPSULE AS DIRECTED ONCE  DAILY 30 capsule 12  . SYMBICORT 160-4.5 MCG/ACT inhaler INHALE 2 PUFFS INTO THE LUNGS TWICE DAILY 10.2 g 12  . tamsulosin (FLOMAX) 0.4 MG CAPS capsule TAKE 1 CAPSULE BY MOUTH DAILY 90 capsule 3   No current facility-administered medications for this encounter.     ECOG PERFORMANCE STATUS:  0 - Asymptomatic  REVIEW OF SYSTEMS:  Patient denies any weight loss, fatigue, weakness, fever, chills or night sweats. Patient denies any loss of vision, blurred vision. Patient denies any ringing  of the ears or hearing loss. No irregular heartbeat. Patient denies heart murmur or history of fainting. Patient denies any chest pain or pain radiating to her upper extremities. Patient denies any shortness of breath, difficulty breathing at night, cough or hemoptysis. Patient denies any swelling in the lower legs. Patient denies any nausea vomiting, vomiting of blood, or coffee ground material in the vomitus. Patient denies any stomach pain. Patient states has had normal bowel movements no significant constipation or diarrhea. Patient denies any dysuria, hematuria or significant nocturia. Patient denies any problems walking, swelling in the joints or loss of balance. Patient denies any skin changes, loss of hair or loss of weight. Patient denies any excessive worrying or anxiety or significant  depression. Patient denies any problems with insomnia. Patient denies excessive thirst, polyuria, polydipsia. Patient denies any swollen glands, patient denies easy bruising or easy bleeding. Patient denies any recent infections, allergies or URI. Patient "s visual fields have not changed significantly in recent time.    PHYSICAL EXAM: There were no vitals taken for this visit. Well-developed well-nourished patient in NAD. HEENT reveals PERLA, EOMI, discs not visualized.  Oral cavity is clear. No oral mucosal lesions are identified. Neck is clear without evidence of cervical or supraclavicular adenopathy. Lungs are clear to A&P.  Cardiac examination is essentially unremarkable with regular rate and rhythm without murmur rub or thrill. Abdomen is benign with no organomegaly or masses noted. Motor sensory and DTR levels are equal and symmetric in the upper and lower extremities. Cranial nerves II through XII are grossly intact. Proprioception is intact. No peripheral adenopathy or edema is identified. No motor or sensory levels are noted. Crude visual fields are within normal range.  LABORATORY DATA: Pathology reports reviewed    RADIOLOGY RESULTS: CT scan PET CT scan reviewed   IMPRESSION: Stage IIb (T3 N0 M0) adenocarcinoma the right lower lobe in 84 year old male with prior history of prostate cancer  PLAN: At this time I to go ahead with possible SB RT treatment based on its close proximity to the liver would plan on delivering 6000 cGy in 10 fractions. Risks and benefits of treatment including increased cough possible chest wall discomfort fatigue alteration of blood counts skin reaction all were discussed in detail with the patient. I will use PET CT fusion study as well as 4D study with abdominal compression for treatment planning purposes. Patient seems to comprehend my treatment plan well. I have set up and personally scheduled CT simulation in about a week's time. Do not see a role for chemotherapy in this stage of disease. Patient has seen medical oncology.  I would like to take this opportunity to thank you for allowing me to participate in the care of your patient.[name], MD

## 2018-02-27 DIAGNOSIS — I2510 Atherosclerotic heart disease of native coronary artery without angina pectoris: Secondary | ICD-10-CM | POA: Diagnosis not present

## 2018-02-27 DIAGNOSIS — E7800 Pure hypercholesterolemia, unspecified: Secondary | ICD-10-CM | POA: Diagnosis not present

## 2018-02-27 DIAGNOSIS — I4891 Unspecified atrial fibrillation: Secondary | ICD-10-CM | POA: Diagnosis not present

## 2018-02-27 DIAGNOSIS — I5022 Chronic systolic (congestive) heart failure: Secondary | ICD-10-CM | POA: Diagnosis not present

## 2018-03-05 DIAGNOSIS — H02055 Trichiasis without entropian left lower eyelid: Secondary | ICD-10-CM | POA: Diagnosis not present

## 2018-03-06 ENCOUNTER — Encounter: Payer: Self-pay | Admitting: *Deleted

## 2018-03-06 ENCOUNTER — Ambulatory Visit
Admission: RE | Admit: 2018-03-06 | Discharge: 2018-03-06 | Disposition: A | Discharge status: Home / Self Care | Payer: Medicare Other | Source: Ambulatory Visit | Attending: Radiation Oncology | Admitting: Radiation Oncology

## 2018-03-06 DIAGNOSIS — C3490 Malignant neoplasm of unspecified part of unspecified bronchus or lung: Secondary | ICD-10-CM

## 2018-03-06 DIAGNOSIS — C3431 Malignant neoplasm of lower lobe, right bronchus or lung: Secondary | ICD-10-CM | POA: Diagnosis not present

## 2018-03-06 DIAGNOSIS — Z87891 Personal history of nicotine dependence: Secondary | ICD-10-CM | POA: Diagnosis not present

## 2018-03-06 DIAGNOSIS — Z510 Encounter for antineoplastic radiation therapy: Secondary | ICD-10-CM | POA: Diagnosis not present

## 2018-03-06 DIAGNOSIS — C3432 Malignant neoplasm of lower lobe, left bronchus or lung: Secondary | ICD-10-CM | POA: Insufficient documentation

## 2018-03-06 NOTE — Progress Notes (Signed)
  Oncology Nurse Navigator Documentation  Navigator Location: [facility] (03/06/18 1500)   )                    Treatment Initiated Date: 03/18/18 (03/06/18 1500) Patient Visit Type: RadOnc (03/06/18 1500) Treatment Phase: CT SIM (03/06/18 1500) Barriers/Navigation Needs: Coordination of Care (03/06/18 1500)   Interventions: Coordination of Care (03/06/18 1500)   Coordination of Care: Appts;Radiology (03/06/18 1500)           orders placed for pt to have follow up CT scan of chest in 3 months after completing radiation treatment. Message sent to scheduling and pt will be notified of appt. Nothing further needed at this time.       Time Spent with Patient: 15 (03/06/18 1500)

## 2018-03-11 DIAGNOSIS — H02055 Trichiasis without entropian left lower eyelid: Secondary | ICD-10-CM | POA: Diagnosis not present

## 2018-03-15 ENCOUNTER — Other Ambulatory Visit: Payer: Self-pay | Admitting: *Deleted

## 2018-03-15 DIAGNOSIS — Z87891 Personal history of nicotine dependence: Secondary | ICD-10-CM | POA: Diagnosis not present

## 2018-03-15 DIAGNOSIS — C3431 Malignant neoplasm of lower lobe, right bronchus or lung: Secondary | ICD-10-CM | POA: Diagnosis not present

## 2018-03-15 DIAGNOSIS — C3432 Malignant neoplasm of lower lobe, left bronchus or lung: Secondary | ICD-10-CM | POA: Diagnosis not present

## 2018-03-18 ENCOUNTER — Ambulatory Visit
Admission: RE | Admit: 2018-03-18 | Discharge: 2018-03-18 | Disposition: A | Discharge status: Home / Self Care | Payer: Medicare Other | Source: Ambulatory Visit | Attending: Radiation Oncology | Admitting: Radiation Oncology

## 2018-03-18 DIAGNOSIS — C3431 Malignant neoplasm of lower lobe, right bronchus or lung: Secondary | ICD-10-CM | POA: Diagnosis not present

## 2018-03-18 DIAGNOSIS — C3432 Malignant neoplasm of lower lobe, left bronchus or lung: Secondary | ICD-10-CM | POA: Insufficient documentation

## 2018-03-18 DIAGNOSIS — Z510 Encounter for antineoplastic radiation therapy: Secondary | ICD-10-CM | POA: Insufficient documentation

## 2018-03-18 DIAGNOSIS — Z87891 Personal history of nicotine dependence: Secondary | ICD-10-CM | POA: Diagnosis not present

## 2018-03-19 ENCOUNTER — Other Ambulatory Visit: Payer: Self-pay | Admitting: Family Medicine

## 2018-03-19 ENCOUNTER — Ambulatory Visit
Admission: RE | Admit: 2018-03-19 | Discharge: 2018-03-19 | Disposition: A | Discharge status: Home / Self Care | Payer: Medicare Other | Source: Ambulatory Visit | Attending: Radiation Oncology | Admitting: Radiation Oncology

## 2018-03-19 DIAGNOSIS — C3432 Malignant neoplasm of lower lobe, left bronchus or lung: Secondary | ICD-10-CM | POA: Diagnosis not present

## 2018-03-19 DIAGNOSIS — Z87891 Personal history of nicotine dependence: Secondary | ICD-10-CM | POA: Diagnosis not present

## 2018-03-19 DIAGNOSIS — H524 Presbyopia: Secondary | ICD-10-CM | POA: Diagnosis not present

## 2018-03-19 DIAGNOSIS — C3431 Malignant neoplasm of lower lobe, right bronchus or lung: Secondary | ICD-10-CM | POA: Diagnosis not present

## 2018-03-20 ENCOUNTER — Ambulatory Visit
Admission: RE | Admit: 2018-03-20 | Discharge: 2018-03-20 | Disposition: A | Discharge status: Home / Self Care | Payer: Medicare Other | Source: Ambulatory Visit | Attending: Radiation Oncology | Admitting: Radiation Oncology

## 2018-03-20 DIAGNOSIS — C3432 Malignant neoplasm of lower lobe, left bronchus or lung: Secondary | ICD-10-CM | POA: Diagnosis not present

## 2018-03-20 DIAGNOSIS — Z87891 Personal history of nicotine dependence: Secondary | ICD-10-CM | POA: Diagnosis not present

## 2018-03-20 DIAGNOSIS — C3431 Malignant neoplasm of lower lobe, right bronchus or lung: Secondary | ICD-10-CM | POA: Diagnosis not present

## 2018-03-21 ENCOUNTER — Ambulatory Visit
Admission: RE | Admit: 2018-03-21 | Discharge: 2018-03-21 | Disposition: A | Discharge status: Home / Self Care | Payer: Medicare Other | Source: Ambulatory Visit | Attending: Radiation Oncology | Admitting: Radiation Oncology

## 2018-03-21 DIAGNOSIS — C3431 Malignant neoplasm of lower lobe, right bronchus or lung: Secondary | ICD-10-CM | POA: Diagnosis not present

## 2018-03-21 DIAGNOSIS — Z87891 Personal history of nicotine dependence: Secondary | ICD-10-CM | POA: Diagnosis not present

## 2018-03-21 DIAGNOSIS — C3432 Malignant neoplasm of lower lobe, left bronchus or lung: Secondary | ICD-10-CM | POA: Diagnosis not present

## 2018-03-22 ENCOUNTER — Ambulatory Visit: Payer: Medicare Other

## 2018-03-25 ENCOUNTER — Other Ambulatory Visit: Payer: Self-pay | Admitting: Family Medicine

## 2018-03-25 ENCOUNTER — Ambulatory Visit
Admission: RE | Admit: 2018-03-25 | Discharge: 2018-03-25 | Disposition: A | Discharge status: Home / Self Care | Payer: Medicare Other | Source: Ambulatory Visit | Attending: Radiation Oncology | Admitting: Radiation Oncology

## 2018-03-25 ENCOUNTER — Ambulatory Visit: Payer: Medicare Other | Admitting: Family Medicine

## 2018-03-25 ENCOUNTER — Encounter: Payer: Self-pay | Admitting: Family Medicine

## 2018-03-25 VITALS — BP 94/54 | HR 42 | Temp 97.7°F | Resp 16 | Wt 197.0 lb

## 2018-03-25 DIAGNOSIS — R05 Cough: Secondary | ICD-10-CM

## 2018-03-25 DIAGNOSIS — J449 Chronic obstructive pulmonary disease, unspecified: Secondary | ICD-10-CM

## 2018-03-25 DIAGNOSIS — R059 Cough, unspecified: Secondary | ICD-10-CM

## 2018-03-25 DIAGNOSIS — R1319 Other dysphagia: Secondary | ICD-10-CM

## 2018-03-25 DIAGNOSIS — Z87891 Personal history of nicotine dependence: Secondary | ICD-10-CM | POA: Diagnosis not present

## 2018-03-25 DIAGNOSIS — R1310 Dysphagia, unspecified: Secondary | ICD-10-CM | POA: Diagnosis not present

## 2018-03-25 DIAGNOSIS — C3432 Malignant neoplasm of lower lobe, left bronchus or lung: Secondary | ICD-10-CM | POA: Diagnosis not present

## 2018-03-25 DIAGNOSIS — C3431 Malignant neoplasm of lower lobe, right bronchus or lung: Secondary | ICD-10-CM | POA: Diagnosis not present

## 2018-03-25 MED ORDER — PREDNISONE 10 MG PO TABS: ORAL_TABLET | ORAL | 0 refills | Status: AC

## 2018-03-25 MED ORDER — AZITHROMYCIN 250 MG PO TABS: ORAL_TABLET | ORAL | 0 refills | Status: AC

## 2018-03-25 NOTE — Progress Notes (Signed)
Patient: James [name] Male    DOB: October 03, 1929   84 y.o.   MRN: [id number] Visit Date: 03/25/2018  Today's Provider: Lelon Huh, MD   No chief complaint on file.  Subjective:    Patient has had productive cough for 2 weeks. Other symptoms include wheezing, shortness of breath, and fatigue. Patient had bronchitis 2 months ago. Patient states he feels symptoms have not completely gone away.   Cough  The current episode started 1 to 4 weeks ago (2 weeks). The problem has been unchanged. The cough is productive of sputum. Associated symptoms include shortness of breath and wheezing. Pertinent negatives include no chest pain, chills, ear congestion, ear pain, fever, headaches, heartburn, hemoptysis, myalgias, nasal congestion, postnasal drip, rash, rhinorrhea, sore throat, sweats or weight loss. The symptoms are aggravated by lying down and exercise. His past medical history is significant for bronchitis and pneumonia.   His wife also reports that he frequently has trouble swallowing, especially pills which get stuck in his throat causing coughing spells. He does report history of having esophagus dilated years ago.    Allergies  Allergen Reactions  . Indomethacin Nausea Only     Current Outpatient Medications:  .  albuterol (PROAIR HFA) 108 (90 Base) MCG/ACT inhaler, INHALE 2 PUFFS INTO LUNGS EVERY 6 HOURS AS NEEDED FOR WHEEZING OR SHORTNESS OF BREATH, Disp: 8.5 g, Rfl: 5 .  apixaban (ELIQUIS) 5 MG TABS tablet, Take 1 tablet (5 mg total) by mouth 2 (two) times daily., Disp: 60 tablet, Rfl: 5 .  atorvastatin (LIPITOR) 80 MG tablet, TAKE 1 TABLET BY MOUTH DAILY, Disp: 90 tablet, Rfl: 4 .  azelastine (OPTIVAR) 0.05 % ophthalmic solution, APPLY 1 DROP TO EYE TWICE DAILY, Disp: 6 mL, Rfl: 5 .  benazepril-hydrochlorthiazide (LOTENSIN HCT) 20-12.5 MG tablet, TAKE ONE TABLET BY MOUTH EVERY DAY, Disp: 90 tablet, Rfl: 3 .  beta carotene w/minerals (OCUVITE) tablet, Take 1 tablet by mouth  daily., Disp: , Rfl:  .  chlorpheniramine-HYDROcodone (TUSSIONEX) 10-8 MG/5ML SUER, TAKE 5 MLS (ONE TEASPOON) EVERY 12 HOURSAS NEEDED FOR COUGH, Disp: 115 mL, Rfl: 0 .  docusate sodium (COLACE) 100 MG capsule, Take 1 capsule (100 mg total) by mouth 2 (two) times daily. (Patient taking differently: Take 100 mg 2 (two) times daily as needed by mouth for mild constipation. ), Disp: 60 capsule, Rfl: 0 .  fluticasone (FLONASE) 50 MCG/ACT nasal spray, USE 2 SPRAYS IN EACH NOSTRIL DAILY, Disp: 16 g, Rfl: 5 .  HYDROcodone-acetaminophen (NORCO) 10-325 MG tablet, Take 1 tablet by mouth every 6 (six) hours as needed., Disp: 30 tablet, Rfl: 0 .  lansoprazole (PREVACID) 30 MG capsule, TAKE 1 CAPSULE BY MOUTH EVERY DAY, Disp: 30 capsule, Rfl: 12 .  LORazepam (ATIVAN) 1 MG tablet, ONE-HALF TO 1 TABLET BY MOUTH AT BEDTIMEAS NEEDED FOR SLEEP, Disp: 30 tablet, Rfl: 5 .  Magnesium 400 MG CAPS, Take 1 capsule by mouth daily. , Disp: , Rfl:  .  metoprolol tartrate (LOPRESSOR) 50 MG tablet, TAKE ONE TABLET BY MOUTH TWICE DAILY, Disp: 180 tablet, Rfl: 4 .  montelukast (SINGULAIR) 10 MG tablet, TAKE ONE TABLET BY MOUTH EVERY DAY, Disp: 30 tablet, Rfl: 12 .  MULTIPLE VITAMIN PO, Take 1 tablet by mouth daily. , Disp: , Rfl:  .  Omega-3 Fatty Acids (FISH OIL) 1000 MG CAPS, Take 1 capsule daily by mouth., Disp: , Rfl:  .  SPIRIVA HANDIHALER 18 MCG inhalation capsule, INHALE THE CONTENTS OF ONE CAPSULE  AS DIRECTED ONCE DAILY, Disp: 30 capsule, Rfl: 12 .  SYMBICORT 160-4.5 MCG/ACT inhaler, INHALE 2 PUFFS INTO THE LUNGS TWICE DAILY, Disp: 10.2 g, Rfl: 12 .  tamsulosin (FLOMAX) 0.4 MG CAPS capsule, TAKE 1 CAPSULE BY MOUTH DAILY, Disp: 90 capsule, Rfl: 3  Review of Systems  Constitutional: Positive for fatigue. Negative for appetite change, chills, fever and weight loss.  HENT: Positive for congestion. Negative for ear pain, postnasal drip, rhinorrhea and sore throat.   Respiratory: Positive for cough, shortness of breath and  wheezing. Negative for hemoptysis and chest tightness.   Cardiovascular: Negative for chest pain and palpitations.  Gastrointestinal: Negative for abdominal pain, heartburn, nausea and vomiting.  Musculoskeletal: Negative for myalgias.  Skin: Negative for rash.  Neurological: Negative for headaches.    Social History   Tobacco Use  . Smoking status: Former Smoker    Packs/day: 1.00    Years: 11.00    Pack years: 11.00    Types: Cigarettes    Last attempt to quit: 02/12/1984    Years since quitting: 34.1  . Smokeless tobacco: Never Used  . Tobacco comment: quit smoking 30 yrs ago  Substance Use Topics  . Alcohol use: Yes    Alcohol/week: 0.0 oz    Comment: beer occasionally   Objective:   BP (!) 94/54 (BP Location: Right Arm, Patient Position: Sitting, Cuff Size: Large)   Pulse (!) 42   Temp 97.7 F (36.5 C) (Oral)   Resp 16   Wt 197 lb (89.4 kg)   SpO2 99%   BMI 29.95 kg/m  There were no vitals filed for this visit.   Physical Exam  General Appearance:    Alert, cooperative, no distress  HENT:   ENT exam normal, no neck nodes or sinus tenderness  Eyes:    PERRL, conjunctiva/corneas clear, EOM's intact       Lungs:     Occasional expiratory wheeze, no focal rales or rhonchi, respirations unlabored  Heart:    Regular rate and rhythm  Neurologic:   Awake, alert, oriented x 3.           Assessment & Plan:     1. Cough  - azithromycin (ZITHROMAX) 250 MG tablet; 2 by mouth today, then 1 daily for 4 days  Dispense: 6 tablet; Refill: 0  2. Chronic obstructive pulmonary disease, unspecified COPD type ([place])  - predniSONE (DELTASONE) 10 MG tablet; 6 tablets for 1 day, then 5 for 1 day, then 4 for 1 day, then 3 for 1 day, then 2 for 1 day then 1 for 1 day.  Dispense: 21 tablet; Refill: 0  Call if symptoms change or if not rapidly improving.   3. Esophageal dysphagia Schedule barium swallow       Lelon Huh, MD  [facility]

## 2018-03-25 NOTE — Patient Instructions (Signed)
   Please inform radiation-oncology that you have been prescribed prednisone and azithromycin

## 2018-03-25 NOTE — Progress Notes (Signed)
This patient needs a modified barium swallow, but I can't find any order that offers [place] as the location for the test. Do you know how to order it?

## 2018-03-25 NOTE — Progress Notes (Signed)
[id number]

## 2018-03-26 ENCOUNTER — Other Ambulatory Visit: Payer: Self-pay

## 2018-03-26 ENCOUNTER — Inpatient Hospital Stay: Payer: Medicare Other | Attending: Radiation Oncology

## 2018-03-26 ENCOUNTER — Ambulatory Visit
Admission: RE | Admit: 2018-03-26 | Discharge: 2018-03-26 | Disposition: A | Discharge status: Home / Self Care | Payer: Medicare Other | Source: Ambulatory Visit | Attending: Radiation Oncology | Admitting: Radiation Oncology

## 2018-03-26 DIAGNOSIS — C3431 Malignant neoplasm of lower lobe, right bronchus or lung: Secondary | ICD-10-CM

## 2018-03-26 DIAGNOSIS — Z87891 Personal history of nicotine dependence: Secondary | ICD-10-CM | POA: Diagnosis not present

## 2018-03-26 DIAGNOSIS — C3432 Malignant neoplasm of lower lobe, left bronchus or lung: Secondary | ICD-10-CM | POA: Diagnosis not present

## 2018-03-26 LAB — CBC
HCT: 41.6 % (ref 40.0–52.0)
Hemoglobin: 14.2 g/dL (ref 13.0–18.0)
MCH: 30.1 pg (ref 26.0–34.0)
MCHC: 34.1 g/dL (ref 32.0–36.0)
MCV: 88.4 fL (ref 80.0–100.0)
Platelets: 156 10*3/uL (ref 150–440)
RBC: 4.71 MIL/uL (ref 4.40–5.90)
RDW: 13.9 % (ref 11.5–14.5)
WBC: 4.7 10*3/uL (ref 3.8–10.6)

## 2018-03-27 ENCOUNTER — Ambulatory Visit
Admission: RE | Admit: 2018-03-27 | Discharge: 2018-03-27 | Disposition: A | Discharge status: Home / Self Care | Payer: Medicare Other | Source: Ambulatory Visit | Attending: Radiation Oncology | Admitting: Radiation Oncology

## 2018-03-27 DIAGNOSIS — C3432 Malignant neoplasm of lower lobe, left bronchus or lung: Secondary | ICD-10-CM | POA: Diagnosis not present

## 2018-03-27 DIAGNOSIS — Z87891 Personal history of nicotine dependence: Secondary | ICD-10-CM | POA: Diagnosis not present

## 2018-03-27 DIAGNOSIS — C3431 Malignant neoplasm of lower lobe, right bronchus or lung: Secondary | ICD-10-CM | POA: Diagnosis not present

## 2018-03-27 NOTE — Telephone Encounter (Signed)
Physician aware. Note closed. Rhonda J Cobb

## 2018-03-28 ENCOUNTER — Ambulatory Visit
Admission: RE | Admit: 2018-03-28 | Discharge: 2018-03-28 | Disposition: A | Discharge status: Home / Self Care | Payer: Medicare Other | Source: Ambulatory Visit | Attending: Radiation Oncology | Admitting: Radiation Oncology

## 2018-03-28 DIAGNOSIS — C3431 Malignant neoplasm of lower lobe, right bronchus or lung: Secondary | ICD-10-CM | POA: Diagnosis not present

## 2018-03-28 DIAGNOSIS — Z87891 Personal history of nicotine dependence: Secondary | ICD-10-CM | POA: Diagnosis not present

## 2018-03-28 DIAGNOSIS — C3432 Malignant neoplasm of lower lobe, left bronchus or lung: Secondary | ICD-10-CM | POA: Diagnosis not present

## 2018-03-29 ENCOUNTER — Ambulatory Visit
Admission: RE | Admit: 2018-03-29 | Discharge: 2018-03-29 | Disposition: A | Discharge status: Home / Self Care | Payer: Medicare Other | Source: Ambulatory Visit | Attending: Radiation Oncology | Admitting: Radiation Oncology

## 2018-03-29 ENCOUNTER — Ambulatory Visit: Payer: Medicare Other

## 2018-03-29 DIAGNOSIS — C3431 Malignant neoplasm of lower lobe, right bronchus or lung: Secondary | ICD-10-CM | POA: Diagnosis not present

## 2018-03-29 DIAGNOSIS — Z87891 Personal history of nicotine dependence: Secondary | ICD-10-CM | POA: Diagnosis not present

## 2018-03-29 DIAGNOSIS — C3432 Malignant neoplasm of lower lobe, left bronchus or lung: Secondary | ICD-10-CM | POA: Diagnosis not present

## 2018-04-01 ENCOUNTER — Ambulatory Visit
Admission: RE | Admit: 2018-04-01 | Discharge: 2018-04-01 | Disposition: A | Discharge status: Home / Self Care | Payer: Medicare Other | Source: Ambulatory Visit | Attending: Radiation Oncology | Admitting: Radiation Oncology

## 2018-04-01 ENCOUNTER — Other Ambulatory Visit: Payer: Self-pay | Admitting: Family Medicine

## 2018-04-01 DIAGNOSIS — C3432 Malignant neoplasm of lower lobe, left bronchus or lung: Secondary | ICD-10-CM | POA: Diagnosis not present

## 2018-04-01 DIAGNOSIS — C3431 Malignant neoplasm of lower lobe, right bronchus or lung: Secondary | ICD-10-CM | POA: Diagnosis not present

## 2018-04-01 DIAGNOSIS — Z87891 Personal history of nicotine dependence: Secondary | ICD-10-CM | POA: Diagnosis not present

## 2018-04-05 ENCOUNTER — Other Ambulatory Visit: Payer: Self-pay

## 2018-04-05 ENCOUNTER — Encounter: Payer: Self-pay | Admitting: Emergency Medicine

## 2018-04-05 ENCOUNTER — Emergency Department: Payer: Medicare Other

## 2018-04-05 ENCOUNTER — Emergency Department
Admission: EM | Admit: 2018-04-05 | Discharge: 2018-04-05 | Disposition: A | Discharge status: Home / Self Care | Payer: Medicare Other | Attending: Emergency Medicine | Admitting: Emergency Medicine

## 2018-04-05 DIAGNOSIS — R079 Chest pain, unspecified: Secondary | ICD-10-CM | POA: Diagnosis not present

## 2018-04-05 DIAGNOSIS — Z87891 Personal history of nicotine dependence: Secondary | ICD-10-CM | POA: Insufficient documentation

## 2018-04-05 DIAGNOSIS — Z951 Presence of aortocoronary bypass graft: Secondary | ICD-10-CM | POA: Insufficient documentation

## 2018-04-05 DIAGNOSIS — J449 Chronic obstructive pulmonary disease, unspecified: Secondary | ICD-10-CM | POA: Insufficient documentation

## 2018-04-05 DIAGNOSIS — Z7901 Long term (current) use of anticoagulants: Secondary | ICD-10-CM | POA: Diagnosis not present

## 2018-04-05 DIAGNOSIS — I110 Hypertensive heart disease with heart failure: Secondary | ICD-10-CM | POA: Diagnosis not present

## 2018-04-05 DIAGNOSIS — I5022 Chronic systolic (congestive) heart failure: Secondary | ICD-10-CM | POA: Insufficient documentation

## 2018-04-05 DIAGNOSIS — Z8673 Personal history of transient ischemic attack (TIA), and cerebral infarction without residual deficits: Secondary | ICD-10-CM | POA: Insufficient documentation

## 2018-04-05 DIAGNOSIS — I2510 Atherosclerotic heart disease of native coronary artery without angina pectoris: Secondary | ICD-10-CM | POA: Diagnosis not present

## 2018-04-05 DIAGNOSIS — C3431 Malignant neoplasm of lower lobe, right bronchus or lung: Secondary | ICD-10-CM | POA: Diagnosis not present

## 2018-04-05 DIAGNOSIS — R0789 Other chest pain: Secondary | ICD-10-CM | POA: Diagnosis not present

## 2018-04-05 DIAGNOSIS — Z79899 Other long term (current) drug therapy: Secondary | ICD-10-CM | POA: Diagnosis not present

## 2018-04-05 DIAGNOSIS — Z96642 Presence of left artificial hip joint: Secondary | ICD-10-CM | POA: Diagnosis not present

## 2018-04-05 LAB — CBC
HCT: 42.6 % (ref 40.0–52.0)
Hemoglobin: 14.4 g/dL (ref 13.0–18.0)
MCH: 30.2 pg (ref 26.0–34.0)
MCHC: 33.7 g/dL (ref 32.0–36.0)
MCV: 89.7 fL (ref 80.0–100.0)
Platelets: 148 10*3/uL — ABNORMAL LOW (ref 150–440)
RBC: 4.75 MIL/uL (ref 4.40–5.90)
RDW: 14.4 % (ref 11.5–14.5)
WBC: 4.6 10*3/uL (ref 3.8–10.6)

## 2018-04-05 LAB — BASIC METABOLIC PANEL
Anion gap: 5 (ref 5–15)
BUN: 18 mg/dL (ref 6–20)
CO2: 29 mmol/L (ref 22–32)
Calcium: 8.5 mg/dL — ABNORMAL LOW (ref 8.9–10.3)
Chloride: 96 mmol/L — ABNORMAL LOW (ref 101–111)
Creatinine, Ser: 1.05 mg/dL (ref 0.61–1.24)
GFR calc Af Amer: >60 mL/min (ref >60)
GFR calc non Af Amer: >60 mL/min (ref >60)
Glucose, Bld: 134 mg/dL — ABNORMAL HIGH (ref 65–99)
Potassium: 4.5 mmol/L (ref 3.5–5.1)
Sodium: 130 mmol/L — ABNORMAL LOW (ref 135–145)

## 2018-04-05 LAB — TROPONIN I
Troponin I: <0.03 ng/mL (ref <0.03)
Troponin I: <0.03 ng/mL (ref <0.03)

## 2018-04-05 NOTE — Discharge Instructions (Addendum)
You have been seen in the emergency department today for chest pain. Your workup has shown normal results. As we discussed please follow-up with your primary care physician in the next 1-2 days for recheck. Return to the emergency department for any further chest pain, trouble breathing, or any other symptom personally concerning to yourself. °

## 2018-04-05 NOTE — ED Triage Notes (Signed)
Pt to ED via POV c/o right sided chest pain that started about 1 hour PTA. Pt states that he is not having much pain right now. Pt denies radiation of pain, pt denies any associated symptoms. Pt just finished 10 days of radiation for lung cancer on the right side. Pt in NAD at this time.

## 2018-04-05 NOTE — ED Provider Notes (Signed)
[facility] Emergency Department Provider Note  Time seen: 4:31 PM  I have reviewed the triage vital signs and the nursing notes.   HISTORY  Chief Complaint Chest Pain    HPI James [name] is a 84 y.o. male with a past medical history of COPD, hyperlipidemia, hypertension, lung cancer recently completed right-sided radiation therapy who presents to the emergency department for chest pain.  According to the patient approximately 1 PM today he developed right-sided chest pain which he describes as sharp pain fairly significant but rather brief lasting minutes.  They came to the emergency department for evaluation approximately an hour after the pain, but states when they arrived to the emergency department the pain had already relieved.  Denies any shortness of breath nausea or diaphoresis at any point today.  Negative for leg pain or swelling.  Negative for fever.  Patient states last week he did develop a cough and was prescribed an antibiotic by his primary care doctor.   Past Medical History:  Diagnosis Date  . Arthritis   . Back pain   . CAD (coronary artery disease)   . Cataract    right  . COPD (chronic obstructive pulmonary disease) ([place])    SPiriva and SYmbicort daily. Albuterol as needed  . Diverticulosis   . Dyspnea    with exertion  . Enlarged prostate    takes Flomax daily  . GERD (gastroesophageal reflux disease)   . History of chicken pox   . History of gout   . History of measles   . History of mumps   . HOH (hard of hearing)   . Hyperglycemia   . Hyperlipidemia    takes Atorvastatin daily  . Hypertension    takes Metoprolol daily as well as Lotensin HCT  . Hypotension   . Joint pain   . Microscopic colitis   . PAF (paroxysmal atrial fibrillation) ([place]), RVR 03/14/2017  . Pneumonia   . Prostate cancer ([place])   . Stroke [facility])    TIA  . TIA (transient ischemic attack)   . Weakness    numbness and tingling.mainly on right     Patient Active Problem List   Diagnosis Date Noted  . Primary adenocarcinoma of lower lobe of right lung ([place]) 02/16/2018  . Late effect of cerebrovascular accident (CVA) 11/14/2017  . CAP (community acquired pneumonia) 11/06/2017  . Cerebral vascular disease 10/31/2017  . CVA (cerebral vascular accident) ([place]) 10/31/2017  . Acute CVA (cerebrovascular accident) ([place]) 10/30/2017  . Lung mass 09/11/2017  . Hematoma of neck 03/14/2017  . PAF (paroxysmal atrial fibrillation) ([place]), RVR 03/14/2017  . Chronic systolic heart failure ([place]) 03/14/2017  . Dysphagia 03/13/2017  . Cervical spondylosis with myelopathy 03/07/2017  . Restrictive lung disease 09/26/2016  . Cervical neck pain with evidence of disc disease 07/04/2016  . PVC (premature ventricular contraction) 11/16/2015  . Abdominal pain 07/27/2015  . Adenocarcinoma of prostate ([place]) 07/27/2015  . Arthritis 07/27/2015  . Blood in feces 07/27/2015  . BPH (benign prostatic hyperplasia) 07/27/2015  . COPD (chronic obstructive pulmonary disease) ([place]) 07/27/2015  . GERD (gastroesophageal reflux disease) 07/27/2015  . Gout 07/27/2015  . Hyperglycemia 07/27/2015  . Hypotension 07/27/2015  . Insomnia 07/27/2015  . Shortness of breath 07/27/2015  . TIA (transient ischemic attack) 07/27/2015  . Atrial fibrillation ([place]) 11/16/2014  . BP (high blood pressure) 10/13/2014  . Urinary hesitancy 09/02/2012  . ED (erectile dysfunction) of organic origin 06/10/2010  . CAD (coronary artery disease) 03/14/2010  .  LBP (low back pain) 03/01/2009  . Allergic rhinitis 09/28/2008  . Essential (primary) hypertension 08/02/2007  . Hyperlipidemia 08/02/2007  . Acquired spondylolisthesis 03/20/2005  . Diverticulosis of colon without hemorrhage 12/18/2000  . Arthropathy 12/18/1998    Past Surgical History:  Procedure Laterality Date  . ANTERIOR CERVICAL DECOMP/DISCECTOMY FUSION N/A 03/07/2017   Procedure: ANTERIOR CERVICAL  DECOMPRESSION/DISCECTOMY FUSION CERVICAL THREE- CERVICAL FOUR, CERVICAL FOUR- CERVICAL FIVE;  Surgeon: Newman Pies, MD;  Location: [place];  Service: Neurosurgery;  Laterality: N/A;  ANTERIOR CERVICAL DECOMPRESSION/DISCECTOMY FUSION CERVICAL 3- CERVICAL 4, CERVIACL 4- CERVICAL 5  . APPENDECTOMY  1950  . CARDIAC CATHETERIZATION  05/29/2013   EF=40-45%. Moderate pulmonary hypertension. Infero/ lateral hypokinesis  . cataract surgery Left   . COLONOSCOPY    . CORONARY ARTERY BYPASS GRAFT  2011   x 5  . FLEXIBLE BRONCHOSCOPY N/A 11/19/2017   Procedure: FLEXIBLE BRONCHOSCOPY;  Surgeon: Laverle Hobby, MD;  Location: [facility];  Service: Pulmonary;  Laterality: N/A;  . MRI BRAIN  06/07/2013   Mild chronic involutional changes. No acute abnormalities  . MRI of neck    . Myocardial Perfusion Scan  05/29/2013   Abnormal myocardial perfusion image consistent with myocardial infarction  . PROSTATE SURGERY  11/18/2012   radiation seed  . TOTAL HIP ARTHROPLASTY Left 1997   hip joint replacement    Prior to Admission medications   Medication Sig Start Date End Date Taking? Authorizing Provider  albuterol (PROAIR HFA) 108 (90 Base) MCG/ACT inhaler INHALE 2 PUFFS INTO LUNGS EVERY 6 HOURS AS NEEDED FOR WHEEZING OR SHORTNESS OF BREATH 11/15/17   Laverle Hobby, MD  apixaban (ELIQUIS) 5 MG TABS tablet Take 1 tablet (5 mg total) by mouth 2 (two) times daily. 12/01/17   Birdie Sons, MD  atorvastatin (LIPITOR) 80 MG tablet TAKE 1 TABLET BY MOUTH DAILY 10/08/17   Birdie Sons, MD  azelastine (OPTIVAR) 0.05 % ophthalmic solution APPLY 1 DROP TO EYE TWICE DAILY 12/08/17   Birdie Sons, MD  benazepril-hydrochlorthiazide (LOTENSIN HCT) 20-12.5 MG tablet TAKE ONE TABLET BY MOUTH EVERY DAY 01/30/18   Birdie Sons, MD  beta carotene w/minerals (OCUVITE) tablet Take 1 tablet by mouth daily.    [provider]  chlorpheniramine-HYDROcodone (TUSSIONEX) 10-8 MG/5ML SUER TAKE 1  TEASPOONFUL EVERY 12 HOURS AS NEEDED FOR COUGH 04/01/18   Birdie Sons, MD  docusate sodium (COLACE) 100 MG capsule Take 1 capsule (100 mg total) by mouth 2 (two) times daily. Patient taking differently: Take 100 mg 2 (two) times daily as needed by mouth for mild constipation.  03/08/17   Newman Pies, MD  fluticasone [facility]) 50 MCG/ACT nasal spray USE 2 SPRAYS IN [facility] NOSTRIL DAILY 01/15/18   Birdie Sons, MD  HYDROcodone-acetaminophen (NORCO) 10-325 MG tablet Take 1 tablet by mouth every 6 (six) hours as needed. 07/10/17   Birdie Sons, MD  lansoprazole (PREVACID) 30 MG capsule TAKE 1 CAPSULE BY MOUTH EVERY DAY 08/06/17   Birdie Sons, MD  LORazepam (ATIVAN) 1 MG tablet ONE-HALF TO 1 TABLET BY MOUTH AT [facility] NEEDED FOR SLEEP 03/19/18   Birdie Sons, MD  Magnesium 400 MG CAPS Take 1 capsule by mouth daily.     [provider]  metoprolol tartrate (LOPRESSOR) 50 MG tablet TAKE ONE TABLET BY MOUTH TWICE DAILY 11/13/17   Birdie Sons, MD  montelukast (SINGULAIR) 10 MG tablet TAKE ONE TABLET BY MOUTH EVERY DAY 07/18/17   Birdie Sons, MD  MULTIPLE  VITAMIN PO Take 1 tablet by mouth daily.     [provider]  Omega-3 Fatty Acids (FISH OIL) 1000 MG CAPS Take 1 capsule daily by mouth.    [provider]  SPIRIVA HANDIHALER 18 MCG inhalation capsule INHALE THE CONTENTS OF ONE CAPSULE AS DIRECTED ONCE DAILY 01/30/18   Birdie Sons, MD  SYMBICORT 160-4.5 MCG/ACT inhaler INHALE 2 PUFFS INTO THE LUNGS TWICE DAILY 07/23/17   Birdie Sons, MD  tamsulosin (FLOMAX) 0.4 MG CAPS capsule TAKE 1 CAPSULE BY MOUTH DAILY 01/07/18   Birdie Sons, MD    Allergies  Allergen Reactions  . Indomethacin Nausea Only    Family History  Problem Relation Age of Onset  . Heart attack Mother   . Stroke Father   . Heart attack Father   . Hypertension Father   . Heart attack Sister   . Bladder Cancer Brother   . Kidney cancer Brother   . Heart Problems  Brother     Social History Social History   Tobacco Use  . Smoking status: Former Smoker    Packs/day: 1.00    Years: 11.00    Pack years: 11.00    Types: Cigarettes    Last attempt to quit: 02/12/1984    Years since quitting: 34.1  . Smokeless tobacco: Never Used  . Tobacco comment: quit smoking 30 yrs ago  Substance Use Topics  . Alcohol use: Yes    Alcohol/week: 0.0 oz    Comment: beer occasionally  . Drug use: No    Review of Systems Constitutional: Negative for fever. Eyes: Negative for visual complaints ENT: Negative for recent illness/congestion Cardiovascular: Positive for right-sided chest pain, now resolved Respiratory: Negative for shortness of breath.  Cough 1 week ago, largely resolved. Gastrointestinal: Negative for abdominal pain, vomiting Genitourinary: Negative for urinary compaints Musculoskeletal: Negative for leg pain or swelling Skin: Negative for skin complaints  Neurological: Negative for headache All other ROS negative  ____________________________________________   PHYSICAL EXAM:  VITAL SIGNS: ED Triage Vitals  Enc Vitals Group     BP 04/05/18 1339 (!) 98/44     Pulse Rate 04/05/18 1339 64     Resp 04/05/18 1339 16     Temp 04/05/18 1339 97.7 F (36.5 C)     Temp Source 04/05/18 1339 Oral     SpO2 04/05/18 1339 96 %     Weight 04/05/18 1335 197 lb (89.4 kg)     Height 04/05/18 1335 _0  (1.727 m)     Head Circumference --      Peak Flow --      Pain Score 04/05/18 1335 0     Pain Loc --      Pain Edu? --      Excl. in [place]? --    Constitutional: Alert and oriented. Well appearing and in no distress. Eyes: Normal exam ENT   Head: Normocephalic and atraumatic.   Mouth/Throat: Mucous membranes are moist. Cardiovascular: Normal rate, regular rhythm. No murmur Respiratory: Normal respiratory effort without tachypnea nor retractions. Breath sounds are clear.  Chest is nontender to palpation. Gastrointestinal: Soft and  nontender. No distention.  Musculoskeletal: Nontender with normal range of motion in all extremities. No lower extremity tenderness or edema. Neurologic:  Normal speech and language. No gross focal neurologic deficits Skin:  Skin is warm, dry and intact.  Psychiatric: Mood and affect are normal.   ____________________________________________    EKG  EKG reviewed and interpreted by myself shows sinus  bradycardia 59 bpm with a widened QRS, left axis deviation, largely normal intervals, nonspecific ST changes no ST elevation.  Morphology largely unchanged from prior EKG.  ____________________________________________    RADIOLOGY  Chest x-ray shows atelectasis versus scarring.  ____________________________________________   INITIAL IMPRESSION / ASSESSMENT AND PLAN / ED COURSE  Pertinent labs & imaging results that were available during my care of the patient were reviewed by me and considered in my medical decision making (see chart for details).  Patient presents to the emergency department for right-sided chest pain.  States the pain is completely resolved at this time.  Differential would include ACS, chest wall pain, pain due to radiation therapy/scar tissue, pneumonia, pneumothorax.  We will check labs, chest x-ray and closely monitor in the emergency department  Patient's chest x-ray is negative for acute abnormality.  Labs are normal/baseline including negative troponin.  We will repeat a troponin and continue to closely monitor.  Patient is chest pain-free and they are eager to go home due to the weather but are agreeable to stay for repeat troponin.  Highly suspect the patient's chest pain is due to recent radiation therapy and likely scar tissue.   Repeat troponin is negative.  Patient continues to be chest pain-free and easily wishes to go home.  We will discharge from the emergency department with atypical chest pain return  precautions.  ____________________________________________   FINAL CLINICAL IMPRESSION(S) / ED DIAGNOSES  Right chest pain    Harvest Dark, MD 04/05/18 1840

## 2018-04-11 DIAGNOSIS — H02005 Unspecified entropion of left lower eyelid: Secondary | ICD-10-CM | POA: Diagnosis not present

## 2018-04-19 ENCOUNTER — Ambulatory Visit
Admission: RE | Admit: 2018-04-19 | Discharge: 2018-04-19 | Disposition: A | Discharge status: Home / Self Care | Payer: Medicare Other | Source: Ambulatory Visit | Attending: Family Medicine | Admitting: Family Medicine

## 2018-04-19 DIAGNOSIS — R1312 Dysphagia, oropharyngeal phase: Secondary | ICD-10-CM | POA: Diagnosis present

## 2018-04-19 DIAGNOSIS — R1310 Dysphagia, unspecified: Secondary | ICD-10-CM | POA: Diagnosis not present

## 2018-04-19 NOTE — Therapy (Signed)
[place], [place], [address] Phone: [phone]   Fax:     Modified Barium Swallow  Patient Details  Name: James [name] MRN: [id number] Date of Birth: Dec 11, 1929 No data recorded  Encounter Date: 04/19/2018  End of Session - 04/19/18 1318    Visit Number  1    Number of Visits  1    Date for SLP Re-Evaluation  04/19/18       Past Medical History:  Diagnosis Date  . Arthritis   . Back pain   . CAD (coronary artery disease)   . Cataract    right  . COPD (chronic obstructive pulmonary disease) ([place])    SPiriva and SYmbicort daily. Albuterol as needed  . Diverticulosis   . Dyspnea    with exertion  . Enlarged prostate    takes Flomax daily  . GERD (gastroesophageal reflux disease)   . History of chicken pox   . History of gout   . History of measles   . History of mumps   . HOH (hard of hearing)   . Hyperglycemia   . Hyperlipidemia    takes Atorvastatin daily  . Hypertension    takes Metoprolol daily as well as Lotensin HCT  . Hypotension   . Joint pain   . Microscopic colitis   . PAF (paroxysmal atrial fibrillation) ([place]), RVR 03/14/2017  . Pneumonia   . Prostate cancer ([place])   . Stroke [facility])    TIA  . TIA (transient ischemic attack)   . Weakness    numbness and tingling.mainly on right    Past Surgical History:  Procedure Laterality Date  . ANTERIOR CERVICAL DECOMP/DISCECTOMY FUSION N/A 03/07/2017   Procedure: ANTERIOR CERVICAL DECOMPRESSION/DISCECTOMY FUSION CERVICAL THREE- CERVICAL FOUR, CERVICAL FOUR- CERVICAL FIVE;  Surgeon: Newman Pies, MD;  Location: [place];  Service: Neurosurgery;  Laterality: N/A;  ANTERIOR CERVICAL DECOMPRESSION/DISCECTOMY FUSION CERVICAL 3- CERVICAL 4, CERVIACL 4- CERVICAL 5  . APPENDECTOMY  1950  . CARDIAC CATHETERIZATION  05/29/2013   EF=40-45%. Moderate pulmonary hypertension. Infero/ lateral hypokinesis  . cataract surgery Left   .  COLONOSCOPY    . CORONARY ARTERY BYPASS GRAFT  2011   x 5  . FLEXIBLE BRONCHOSCOPY N/A 11/19/2017   Procedure: FLEXIBLE BRONCHOSCOPY;  Surgeon: Laverle Hobby, MD;  Location: [facility];  Service: Pulmonary;  Laterality: N/A;  . MRI BRAIN  06/07/2013   Mild chronic involutional changes. No acute abnormalities  . MRI of neck    . Myocardial Perfusion Scan  05/29/2013   Abnormal myocardial perfusion image consistent with myocardial infarction  . PROSTATE SURGERY  11/18/2012   radiation seed  . TOTAL HIP ARTHROPLASTY Left 1997   hip joint replacement    There were no vitals filed for this visit.     Subjective: Patient behavior: (alertness, ability to follow instructions, etc.): The patient is able to verbalize his complaints and follow directions.  Chief complaint: Pills sometimes get stuck, trouble certain foods (lettuce, meat).  History of ACDF.   Objective:  Radiological Procedure: A videoflouroscopic evaluation of oral-preparatory, reflex initiation, and pharyngeal phases of the swallow was performed; as well as a screening of the upper esophageal phase.  I. POSTURE: Upright in MBS chair  II. VIEW: Lateral  III. COMPENSATORY STRATEGIES: N/A  IV. BOLUSES ADMINISTERED:   Thin Liquid: 4   Nectar-thick Liquid: 1   Honey-thick Liquid: DNT   Puree: 2 teaspoon presentations   Mechanical Soft: 1/4 graham  cracker in applesauce    Barium tablet  V. RESULTS OF EVALUATION: A. ORAL PREPARATORY PHASE: (The lips, tongue, and velum are observed for strength and coordination)       **Overall Severity Rating: Within normal limits  B. SWALLOW INITIATION/REFLEX: (The reflex is normal if "triggered" by the time the bolus reached the base of the tongue)  **Overall Severity Rating: Mild; triggers while falling from the valleculae to the pyriform sinuses with thin liquids and at the valleculae for all other consistencies.    C. PHARYNGEAL PHASE: (Pharyngeal function is normal if  the bolus shows rapid, smooth, and continuous transit through the pharynx and there is no pharyngeal residue after the swallow)  **Overall Severity Rating:Minimal; reduced hyolaryngeal excursion, incomplete epiglottic inversion, and trace pharyngeal residue   D. LARYNGEAL PENETRATION: (Material entering into the laryngeal inlet/vestibule but not aspirated) None  E. ASPIRATION: None  F. ESOPHAGEAL PHASE: (Screening of the upper esophagus): poor view secondary shoulder shadow.  The patient barium tablet appeared to hesitate momentarily in the mid-esophagus  ASSESSMENT: This 84 year old man; with complaint of difficulty swallowing food and having pills get hung; is presenting with mild oropharyngeal characterized by delayed pharyngeal swallow initiation, reduced hyolaryngeal excursion, incomplete epiglottic inversion, and trace pharyngeal residue post swallow.  There was no observed laryngeal penetration or tracheal aspiration.  The patient does not appear to be at significant risk for prandial aspiration.  The patient was able to swallow a barium tablet, clearing oropharynx once propelled backwards.  The patient may benefit from referral to gastroenterology to assess esophageal function.  In the meantime, he has been advised to avoid problematic foods and to soften/moisten/mince as needed.     PLAN/RECOMMENDATIONS:   A. Diet: Regular; soften, moisten, mince as needed   B. Swallowing Precautions: Reflux precautions   C. Recommended consultation to: GI   D. Therapy recommendations: speech therapy is not indicated   E. Results and recommendations were Discussed with the patient and his wife immediately following the study and the final report routed to the referring MD.   Oropharyngeal dysphagia - Plan: DG OP Swallowing Func-Medicare/Speech Path, DG OP Swallowing Func-Medicare/Speech Path        Problem List Patient Active Problem List   Diagnosis Date Noted  . Primary adenocarcinoma of  lower lobe of right lung ([place]) 02/16/2018  . Late effect of cerebrovascular accident (CVA) 11/14/2017  . CAP (community acquired pneumonia) 11/06/2017  . Cerebral vascular disease 10/31/2017  . CVA (cerebral vascular accident) ([place]) 10/31/2017  . Acute CVA (cerebrovascular accident) ([place]) 10/30/2017  . Lung mass 09/11/2017  . Hematoma of neck 03/14/2017  . PAF (paroxysmal atrial fibrillation) ([place]), RVR 03/14/2017  . Chronic systolic heart failure ([place]) 03/14/2017  . Dysphagia 03/13/2017  . Cervical spondylosis with myelopathy 03/07/2017  . Restrictive lung disease 09/26/2016  . Cervical neck pain with evidence of disc disease 07/04/2016  . PVC (premature ventricular contraction) 11/16/2015  . Abdominal pain 07/27/2015  . Adenocarcinoma of prostate ([place]) 07/27/2015  . Arthritis 07/27/2015  . Blood in feces 07/27/2015  . BPH (benign prostatic hyperplasia) 07/27/2015  . COPD (chronic obstructive pulmonary disease) ([place]) 07/27/2015  . GERD (gastroesophageal reflux disease) 07/27/2015  . Gout 07/27/2015  . Hyperglycemia 07/27/2015  . Hypotension 07/27/2015  . Insomnia 07/27/2015  . Shortness of breath 07/27/2015  . TIA (transient ischemic attack) 07/27/2015  . Atrial fibrillation ([place]) 11/16/2014  . BP (high blood pressure) 10/13/2014  . Urinary hesitancy 09/02/2012  . ED (erectile dysfunction) of  organic origin 06/10/2010  . CAD (coronary artery disease) 03/14/2010  . LBP (low back pain) 03/01/2009  . Allergic rhinitis 09/28/2008  . Essential (primary) hypertension 08/02/2007  . Hyperlipidemia 08/02/2007  . Acquired spondylolisthesis 03/20/2005  . Diverticulosis of colon without hemorrhage 12/18/2000  . Arthropathy 12/18/1998   Leroy Sea, MS/CCC- SLP  Lou Miner 04/19/2018, 1:19 PM  [facility] [place], [place], [address] Phone: [phone]   Fax:     Name: VIBHAV WADDILL MRN:  [id number] Date of Birth: November 01, 1929

## 2018-04-22 ENCOUNTER — Telehealth: Payer: Self-pay | Admitting: *Deleted

## 2018-04-22 DIAGNOSIS — R1310 Dysphagia, unspecified: Secondary | ICD-10-CM

## 2018-04-22 NOTE — Telephone Encounter (Signed)
-----   Message from Birdie Sons, MD sent at 04/22/2018  1:58 PM EDT ----- Barium swallow test is normal. Recommend referral to GI for further evaluation but to trouble swallowing pills.

## 2018-04-22 NOTE — Telephone Encounter (Signed)
No answer and no vm. Will try again later.  

## 2018-04-24 DIAGNOSIS — H02006 Unspecified entropion of left eye, unspecified eyelid: Secondary | ICD-10-CM | POA: Diagnosis not present

## 2018-04-24 NOTE — Telephone Encounter (Signed)
Line was busy. Will try again later.

## 2018-04-30 NOTE — Telephone Encounter (Signed)
Patient has been advised and would like to proceed with order for G.I referral. KW

## 2018-04-30 NOTE — Telephone Encounter (Signed)
Referral in epic. Please schedule. Thanks!

## 2018-05-07 ENCOUNTER — Other Ambulatory Visit: Payer: Self-pay | Admitting: Family Medicine

## 2018-05-08 ENCOUNTER — Encounter: Payer: Self-pay | Admitting: Radiation Oncology

## 2018-05-08 ENCOUNTER — Other Ambulatory Visit: Payer: Self-pay

## 2018-05-08 ENCOUNTER — Ambulatory Visit
Admission: RE | Admit: 2018-05-08 | Discharge: 2018-05-08 | Disposition: A | Discharge status: Home / Self Care | Payer: Medicare Other | Source: Ambulatory Visit | Attending: Radiation Oncology | Admitting: Radiation Oncology

## 2018-05-08 VITALS — BP 113/61 | HR 62 | Resp 20 | Wt 192.5 lb

## 2018-05-08 DIAGNOSIS — Z923 Personal history of irradiation: Secondary | ICD-10-CM | POA: Diagnosis not present

## 2018-05-08 DIAGNOSIS — C3431 Malignant neoplasm of lower lobe, right bronchus or lung: Secondary | ICD-10-CM | POA: Diagnosis present

## 2018-05-08 NOTE — Progress Notes (Signed)
Radiation Oncology Follow up Note  Name: James [name]   Date:   05/08/2018 MRN:  [id number] DOB: 1929/01/17    This 84 y.o. male presents to the clinic today for one-month follow-up.status post radiation therapy for stage IIB adenocarcinoma the right lower lobe  REFERRING PROVIDER: Birdie Sons, MD  HPI: patient is a 84 year old male now out 1 month having completed hypofractionated course of radiation therapy to his right lower lobe for a stage IIB (T3 N0 M0) adenocarcinoma. Seen today in routine follow-up he is doing well. He specifically denies significant cough hemoptysis or chest tightness has some mild mucus production in the mornings. He is having no dysphagia..  COMPLICATIONS OF TREATMENT: none  FOLLOW UP COMPLIANCE: keeps appointments   PHYSICAL EXAM:  BP 113/61   Pulse 62   Resp 20   Wt 192 lb 7.4 oz (87.3 kg)   BMI 29.26 kg/m  Well-developed well-nourished patient in NAD. HEENT reveals PERLA, EOMI, discs not visualized.  Oral cavity is clear. No oral mucosal lesions are identified. Neck is clear without evidence of cervical or supraclavicular adenopathy. Lungs are clear to A&P. Cardiac examination is essentially unremarkable with regular rate and rhythm without murmur rub or thrill. Abdomen is benign with no organomegaly or masses noted. Motor sensory and DTR levels are equal and symmetric in the upper and lower extremities. Cranial nerves II through XII are grossly intact. Proprioception is intact. No peripheral adenopathy or edema is identified. No motor or sensory levels are noted. Crude visual fields are within normal range.  RADIOLOGY RESULTS: o current films for review  PLAN: at the present time patient is doing well 1 month after hypofractionated course of radiation therapy for stage IIB adenocarcinoma of the right lower lobe. I'm please was overall progress. He or he has a follow-up CT scan scheduled for July I will see him a couple weeks after that in follow-up.  Patient continues to do well he knows to call with any concerns.  I would like to take this opportunity to thank you for allowing me to participate in the care of your patient.[name], MD  .

## 2018-05-16 DIAGNOSIS — H02045 Spastic entropion of left lower eyelid: Secondary | ICD-10-CM | POA: Diagnosis not present

## 2018-05-16 DIAGNOSIS — H02133 Senile ectropion of right eye, unspecified eyelid: Secondary | ICD-10-CM | POA: Diagnosis not present

## 2018-05-16 DIAGNOSIS — H02135 Senile ectropion of left lower eyelid: Secondary | ICD-10-CM | POA: Diagnosis not present

## 2018-06-14 ENCOUNTER — Ambulatory Visit (INDEPENDENT_AMBULATORY_CARE_PROVIDER_SITE_OTHER): Payer: Medicare Other | Admitting: Gastroenterology

## 2018-06-14 ENCOUNTER — Encounter (INDEPENDENT_AMBULATORY_CARE_PROVIDER_SITE_OTHER): Payer: Self-pay

## 2018-06-14 ENCOUNTER — Other Ambulatory Visit: Payer: Self-pay

## 2018-06-14 ENCOUNTER — Encounter: Payer: Self-pay | Admitting: Gastroenterology

## 2018-06-14 VITALS — BP 129/63 | HR 60 | Resp 17 | Ht 68.0 in | Wt 193.4 lb

## 2018-06-14 DIAGNOSIS — R1310 Dysphagia, unspecified: Secondary | ICD-10-CM

## 2018-06-14 DIAGNOSIS — R1319 Other dysphagia: Secondary | ICD-10-CM

## 2018-06-14 NOTE — Progress Notes (Signed)
Cephas Darby, MD [address]  [place]  [place], [place] [address]  Main: [phone]  Fax: [phone]    Gastroenterology Consultation  Referring Provider:     Birdie Sons, MD Primary Care Physician:  Birdie Sons, MD Primary Gastroenterologist:  Dr. Cephas Darby Reason for Consultation:     Chronic dysphagia to solids        HPI:   James [name] is a 84 y.o. male referred by Dr. Birdie Sons, MD  for consultation & management of chronic dysphagia. Patient is diagnosed with right lower lobe stage IIB adenocarcinoma of the lung and underwent radiation as he was not a candidate for chemotherapy. He tolerated the radiation treatment very well. He reports prior history of peptic stricture and underwent dilation in 1980s with the balloon. Patient has been experiencing sensation of food stuck in his throat especially to meat and other solid foods.Symptoms are intermittent, and able to tolerate liquids. He is on Prevacid and ranitidine daily. He denies heartburn. He is accompanied by his wife today. He smoked altogether for 8 years. He denies weight loss, regurgitation, nausea and vomiting, epigastric pain.  NSAIDs: none  Antiplts/Anticoagulants/Anti thrombotics: Eliquis for history of TIA that resulted in mild left upper extremity weakness  GI Procedures:  Colonoscopy in 01/2012 by Dr. Vira Agar Diverticulosis, Internal Hemorrhoids; Hyperplastic polyps. No further Colonoscopy recommneded per Dr. Tiffany Kocher  Past Medical History:  Diagnosis Date  . Arthritis   . Back pain   . CAD (coronary artery disease)   . Cataract    right  . COPD (chronic obstructive pulmonary disease) ([place])    SPiriva and SYmbicort daily. Albuterol as needed  . Diverticulosis   . Dyspnea    with exertion  . Enlarged prostate    takes Flomax daily  . GERD (gastroesophageal reflux disease)   . History of chicken pox   . History of gout   . History of measles   . History of mumps   .  HOH (hard of hearing)   . Hyperglycemia   . Hyperlipidemia    takes Atorvastatin daily  . Hypertension    takes Metoprolol daily as well as Lotensin HCT  . Hypotension   . Joint pain   . Microscopic colitis   . PAF (paroxysmal atrial fibrillation) ([place]), RVR 03/14/2017  . Pneumonia   . Prostate cancer ([place])   . Stroke [facility])    TIA  . TIA (transient ischemic attack)   . Weakness    numbness and tingling.mainly on right    Past Surgical History:  Procedure Laterality Date  . ANTERIOR CERVICAL DECOMP/DISCECTOMY FUSION N/A 03/07/2017   Procedure: ANTERIOR CERVICAL DECOMPRESSION/DISCECTOMY FUSION CERVICAL THREE- CERVICAL FOUR, CERVICAL FOUR- CERVICAL FIVE;  Surgeon: Newman Pies, MD;  Location: [facility];  Service: Neurosurgery;  Laterality: N/A;  ANTERIOR CERVICAL DECOMPRESSION/DISCECTOMY FUSION CERVICAL 3- CERVICAL 4, CERVIACL 4- CERVICAL 5  . APPENDECTOMY  1950  . CARDIAC CATHETERIZATION  05/29/2013   EF=40-45%. Moderate pulmonary hypertension. Infero/ lateral hypokinesis  . cataract surgery Left   . COLONOSCOPY    . CORONARY ARTERY BYPASS GRAFT  2011   x 5  . FLEXIBLE BRONCHOSCOPY N/A 11/19/2017   Procedure: FLEXIBLE BRONCHOSCOPY;  Surgeon: Laverle Hobby, MD;  Location: [facility];  Service: Pulmonary;  Laterality: N/A;  . MRI BRAIN  06/07/2013   Mild chronic involutional changes. No acute abnormalities  . MRI of neck    . Myocardial Perfusion Scan  05/29/2013   Abnormal  myocardial perfusion image consistent with myocardial infarction  . PROSTATE SURGERY  11/18/2012   radiation seed  . TOTAL HIP ARTHROPLASTY Left 1997   hip joint replacement    Current Outpatient Medications:  .  albuterol (PROAIR HFA) 108 (90 Base) MCG/ACT inhaler, INHALE 2 PUFFS INTO LUNGS EVERY 6 HOURS AS NEEDED FOR WHEEZING OR SHORTNESS OF BREATH, Disp: 8.5 g, Rfl: 5 .  atorvastatin (LIPITOR) 80 MG tablet, TAKE 1 TABLET BY MOUTH DAILY, Disp: 90 tablet, Rfl: 4 .  azelastine (OPTIVAR) 0.05 %  ophthalmic solution, APPLY 1 DROP TO EYE TWICE DAILY, Disp: 6 mL, Rfl: 5 .  benazepril-hydrochlorthiazide (LOTENSIN HCT) 20-12.5 MG tablet, TAKE ONE TABLET BY MOUTH EVERY DAY, Disp: 90 tablet, Rfl: 3 .  beta carotene w/minerals (OCUVITE) tablet, Take 1 tablet by mouth daily., Disp: , Rfl:  .  docusate sodium (COLACE) 100 MG capsule, Take 1 capsule (100 mg total) by mouth 2 (two) times daily. (Patient taking differently: Take 100 mg 2 (two) times daily as needed by mouth for mild constipation. ), Disp: 60 capsule, Rfl: 0 .  ELIQUIS 5 MG TABS tablet, TAKE ONE TABLET BY MOUTH TWICE DAILY, Disp: 60 tablet, Rfl: 11 .  famotidine (PEPCID) 20 MG tablet, , Disp: , Rfl: 1 .  fluticasone (FLONASE) 50 MCG/ACT nasal spray, USE 2 SPRAYS IN EACH NOSTRIL DAILY, Disp: 16 g, Rfl: 5 .  HYDROcodone-acetaminophen (NORCO) 10-325 MG tablet, Take 1 tablet by mouth every 6 (six) hours as needed., Disp: 30 tablet, Rfl: 0 .  ipratropium (ATROVENT) 0.03 % nasal spray, , Disp: , Rfl: 1 .  lansoprazole (PREVACID) 30 MG capsule, TAKE 1 CAPSULE BY MOUTH EVERY DAY, Disp: 30 capsule, Rfl: 12 .  LORazepam (ATIVAN) 1 MG tablet, ONE-HALF TO 1 TABLET BY MOUTH AT BEDTIMEAS NEEDED FOR SLEEP, Disp: 30 tablet, Rfl: 5 .  Magnesium 400 MG CAPS, Take 1 capsule by mouth daily. , Disp: , Rfl:  .  metoprolol tartrate (LOPRESSOR) 50 MG tablet, TAKE ONE TABLET BY MOUTH TWICE DAILY, Disp: 180 tablet, Rfl: 4 .  montelukast (SINGULAIR) 10 MG tablet, TAKE ONE TABLET BY MOUTH EVERY DAY, Disp: 30 tablet, Rfl: 12 .  MULTIPLE VITAMIN PO, Take 1 tablet by mouth daily. , Disp: , Rfl:  .  Omega-3 Fatty Acids (FISH OIL) 1000 MG CAPS, Take 1 capsule daily by mouth., Disp: , Rfl:  .  SPIRIVA HANDIHALER 18 MCG inhalation capsule, INHALE THE CONTENTS OF ONE CAPSULE AS DIRECTED ONCE DAILY, Disp: 30 capsule, Rfl: 12 .  SYMBICORT 160-4.5 MCG/ACT inhaler, INHALE 2 PUFFS INTO THE LUNGS TWICE DAILY, Disp: 10.2 g, Rfl: 12 .  tamsulosin (FLOMAX) 0.4 MG CAPS capsule,  TAKE 1 CAPSULE BY MOUTH DAILY, Disp: 90 capsule, Rfl: 3 .  chlorpheniramine-HYDROcodone (TUSSIONEX) 10-8 MG/5ML SUER, TAKE 1 TEASPOONFUL EVERY 12 HOURS AS NEEDED FOR COUGH (Patient not taking: Reported on 06/14/2018), Disp: 115 mL, Rfl: 0    Family History  Problem Relation Age of Onset  . Heart attack Mother   . Stroke Father   . Heart attack Father   . Hypertension Father   . Heart attack Sister   . Bladder Cancer Brother   . Kidney cancer Brother   . Heart Problems Brother      Social History   Tobacco Use  . Smoking status: Former Smoker    Packs/day: 1.00    Years: 11.00    Pack years: 11.00    Types: Cigarettes    Last attempt to quit: 02/12/1984  Years since quitting: 34.3  . Smokeless tobacco: Never Used  . Tobacco comment: quit smoking 30 yrs ago  Substance Use Topics  . Alcohol use: Yes    Alcohol/week: 0.0 oz    Comment: beer occasionally  . Drug use: No    Allergies as of 06/14/2018 - Review Complete 06/14/2018  Allergen Reaction Noted  . Indomethacin Nausea Only 05/31/2015    Review of Systems:    All systems reviewed and negative except where noted in HPI.   Physical Exam:  BP 129/63 (BP Location: Left Arm, Patient Position: Sitting, Cuff Size: Large)   Pulse 60   Resp 17   Ht _0  (1.727 m)   Wt 193 lb 6.4 oz (87.7 kg)   BMI 29.41 kg/m  No LMP for male patient.  General:   Alert,  Well-developed, well-nourished, pleasant and cooperative in NAD Head:  Normocephalic and atraumatic. Eyes:  Sclera clear, no icterus.   Conjunctiva pink. Ears:  Normal auditory acuity. Nose:  No deformity, discharge, or lesions. Mouth:  No deformity or lesions,oropharynx pink & moist. Neck:  Supple; no masses or thyromegaly. Lungs:  Respirations even and unlabored.  Clear throughout to auscultation.   No wheezes, crackles, or rhonchi. No acute distress. Heart:  Regular rate and rhythm; no murmurs, clicks, rubs, or gallops. Abdomen:  Normal bowel sounds. Soft,  non-tender and non-distended without masses, hepatosplenomegaly or hernias noted.  No guarding or rebound tenderness.   Rectal: Not performed Msk:  Symmetrical without gross deformities. Good, equal movement & strength bilaterally. Pulses:  Normal pulses noted. Extremities:  No clubbing or edema.  No cyanosis. Neurologic:  Alert and oriented x3;  grossly normal neurologically. Skin:  Intact without significant lesions or rashes. No jaundice. Lymph Nodes:  No significant cervical adenopathy. Psych:  Alert and cooperative. Normal mood and affect.  Imaging Studies: Modified barium Swallow in 05/2018 was normal  Assessment and Plan:   James [name] is a 84 y.o. Caucasian male with history of CVA/TIA on Eliquis, stage II right lobe adenocarcinoma of the lung status post radiation treatment, seen in consultation for chronic dysphagia to solids. Patient with known history of possible stricture and underwent dilation in 1980s. He is on long-term PPI. He denies typical GERD symptoms  - continue Prevacid 30 mg daily - Advised him to avoid hard meat products - chew thoroughly and consume finely chopped foods and ground meat - EGD for further evaluation - Hold Eliquis for 2-3 days prior to endoscopy   Follow up in 4 weeks   Cephas Darby, MD

## 2018-06-18 ENCOUNTER — Other Ambulatory Visit: Payer: Self-pay | Admitting: Family Medicine

## 2018-06-30 NOTE — Progress Notes (Signed)
[facility]  Telephone:(336) [phone] Fax:(336) [phone]  ID: James [name] OB: 12-08-1929  MR#: [id number]  GBT#:[id number]  Patient Care Team: Birdie Sons, MD as PCP - General (Family Medicine) Abbie Sons, MD (Urology) Ubaldo Glassing Javier Docker, MD as Consulting Physician (Cardiology) Telford Nab, RN as Registered Nurse  CHIEF COMPLAINT: Clinical stage IIB adenocarcinoma of the right lower lobe lung.  INTERVAL HISTORY: Patient returns to clinic today for further evaluation and discussion of his imaging results.  He completed XRT to the lesion in his right lower lobe approximately 3 months ago.  He currently feels well and is asymptomatic. He has no neurologic complaints.  He denies any recent fevers or illnesses.  He has a good appetite and denies weight loss.  He denies any chest pain, shortness of breath, cough, or hemoptysis.  He has no nausea, vomiting, constipation, or diarrhea.  He has no urinary complaints.  Patient feels at his baseline offers no specific complaints today.  REVIEW OF SYSTEMS:   Review of Systems  Constitutional: Negative.  Negative for fever, malaise/fatigue and weight loss.  Respiratory: Negative.  Negative for cough, hemoptysis and shortness of breath.   Cardiovascular: Negative.  Negative for chest pain and leg swelling.  Gastrointestinal: Negative.  Negative for abdominal pain.  Genitourinary: Negative.  Negative for dysuria.  Musculoskeletal: Negative.  Negative for back pain.  Skin: Negative.  Negative for rash.  Neurological: Negative.  Negative for sensory change, focal weakness, weakness and headaches.  Psychiatric/Behavioral: Negative.  The patient is not nervous/anxious.     As per HPI. Otherwise, a complete review of systems is negative.  PAST MEDICAL HISTORY: Past Medical History:  Diagnosis Date  . Arthritis   . Back pain   . CAD (coronary artery disease)   . Cataract    right  . COPD (chronic obstructive pulmonary  disease) ([place])    SPiriva and SYmbicort daily. Albuterol as needed  . Diverticulosis   . Dyspnea    with exertion  . Enlarged prostate    takes Flomax daily  . GERD (gastroesophageal reflux disease)   . History of chicken pox   . History of gout   . History of measles   . History of mumps   . HOH (hard of hearing)   . Hyperglycemia   . Hyperlipidemia    takes Atorvastatin daily  . Hypertension    takes Metoprolol daily as well as Lotensin HCT  . Hypotension   . Joint pain   . Microscopic colitis   . PAF (paroxysmal atrial fibrillation) ([place]), RVR 03/14/2017  . Pneumonia   . Prostate cancer ([place])   . Stroke [facility])    TIA  . TIA (transient ischemic attack)   . Weakness    numbness and tingling.mainly on right    PAST SURGICAL HISTORY: Past Surgical History:  Procedure Laterality Date  . ANTERIOR CERVICAL DECOMP/DISCECTOMY FUSION N/A 03/07/2017   Procedure: ANTERIOR CERVICAL DECOMPRESSION/DISCECTOMY FUSION CERVICAL THREE- CERVICAL FOUR, CERVICAL FOUR- CERVICAL FIVE;  Surgeon: Newman Pies, MD;  Location: [facility];  Service: Neurosurgery;  Laterality: N/A;  ANTERIOR CERVICAL DECOMPRESSION/DISCECTOMY FUSION CERVICAL 3- CERVICAL 4, CERVIACL 4- CERVICAL 5  . APPENDECTOMY  1950  . CARDIAC CATHETERIZATION  05/29/2013   EF=40-45%. Moderate pulmonary hypertension. Infero/ lateral hypokinesis  . cataract surgery Left   . COLONOSCOPY    . CORONARY ARTERY BYPASS GRAFT  2011   x 5  . ESOPHAGOGASTRODUODENOSCOPY (EGD) WITH PROPOFOL N/A 07/01/2018   Procedure: ESOPHAGOGASTRODUODENOSCOPY (EGD) WITH  PROPOFOL;  Surgeon: Lin Landsman, MD;  Location: [facility] END[facility]COPY;  Service: Gastroenterology;  Laterality: N/A;  . EYE SURGERY    . FLEXIBLE BRONCHOSCOPY N/A 11/19/2017   Procedure: FLEXIBLE BRONCHOSCOPY;  Surgeon: Laverle Hobby, MD;  Location: [facility];  Service: Pulmonary;  Laterality: N/A;  . MRI BRAIN  06/07/2013   Mild chronic involutional changes. No acute abnormalities  . MRI  of neck    . Myocardial Perfusion Scan  05/29/2013   Abnormal myocardial perfusion image consistent with myocardial infarction  . PROSTATE SURGERY  11/18/2012   radiation seed  . TOTAL HIP ARTHROPLASTY Left 1997   hip joint replacement    FAMILY HISTORY: Family History  Problem Relation Age of Onset  . Heart attack Mother   . Stroke Father   . Heart attack Father   . Hypertension Father   . Heart attack Sister   . Bladder Cancer Brother   . Kidney cancer Brother   . Heart Problems Brother     ADVANCED DIRECTIVES (Y/N):  N  HEALTH MAINTENANCE: Social History   Tobacco Use  . Smoking status: Former Smoker    Packs/day: 1.00    Years: 11.00    Pack years: 11.00    Types: Cigarettes    Last attempt to quit: 02/12/1984    Years since quitting: 34.4  . Smokeless tobacco: Never Used  . Tobacco comment: quit smoking 30 yrs ago  Substance Use Topics  . Alcohol use: Yes    Alcohol/week: 0.0 oz    Comment: beer occasionally  . Drug use: No     Colonoscopy:  PAP:  Bone density:  Lipid panel:  Allergies  Allergen Reactions  . Indomethacin Nausea Only    Current Outpatient Medications  Medication Sig Dispense Refill  . albuterol (PROAIR HFA) 108 (90 Base) MCG/ACT inhaler INHALE 2 PUFFS INTO LUNGS EVERY 6 HOURS AS NEEDED FOR WHEEZING OR SHORTNESS OF BREATH 8.5 g 5  . atorvastatin (LIPITOR) 80 MG tablet TAKE 1 TABLET BY MOUTH DAILY 90 tablet 4  . azelastine (OPTIVAR) 0.05 % ophthalmic solution APPLY 1 DROP TO EYE TWICE DAILY 6 mL 5  . benazepril-hydrochlorthiazide (LOTENSIN HCT) 20-12.5 MG tablet TAKE ONE TABLET BY MOUTH EVERY DAY 90 tablet 3  . beta carotene w/minerals (OCUVITE) tablet Take 1 tablet by mouth daily.    . chlorpheniramine-HYDROcodone (TUSSIONEX) 10-8 MG/5ML SUER TAKE 1 TEASPOONFUL EVERY 12 HOURS AS NEEDED FOR COUGH 115 mL 0  . ELIQUIS 5 MG TABS tablet TAKE ONE TABLET BY MOUTH TWICE DAILY 60 tablet 11  . famotidine (PEPCID) 20 MG tablet   1  . fluticasone  (FLONASE) 50 MCG/ACT nasal spray USE 2 SPRAYS IN EACH NOSTRIL DAILY 48 g 3  . HYDROcodone-acetaminophen (NORCO) 10-325 MG tablet Take 1 tablet by mouth every 6 (six) hours as needed. 30 tablet 0  . ipratropium (ATROVENT) 0.03 % nasal spray   1  . lansoprazole (PREVACID) 30 MG capsule TAKE 1 CAPSULE BY MOUTH EVERY DAY 30 capsule 12  . LORazepam (ATIVAN) 1 MG tablet ONE-HALF TO 1 TABLET BY MOUTH AT BEDTIMEAS NEEDED FOR SLEEP 30 tablet 5  . Magnesium 400 MG CAPS Take 1 capsule by mouth daily.     . metoprolol tartrate (LOPRESSOR) 50 MG tablet TAKE ONE TABLET BY MOUTH TWICE DAILY 180 tablet 4  . montelukast (SINGULAIR) 10 MG tablet TAKE ONE TABLET BY MOUTH EVERY DAY 30 tablet 12  . MULTIPLE VITAMIN PO Take 1 tablet by mouth daily.     [name]  Omega-3 Fatty Acids (FISH OIL) 1000 MG CAPS Take 1 capsule daily by mouth.    . SPIRIVA HANDIHALER 18 MCG inhalation capsule INHALE THE CONTENTS OF ONE CAPSULE AS DIRECTED ONCE DAILY 30 capsule 12  . SYMBICORT 160-4.5 MCG/ACT inhaler INHALE 2 PUFFS INTO THE LUNGS TWICE DAILY 10.2 g 12  . tamsulosin (FLOMAX) 0.4 MG CAPS capsule TAKE 1 CAPSULE BY MOUTH DAILY 90 capsule 3   No current facility-administered medications for this visit.     OBJECTIVE: Vitals:   07/03/18 1128  BP: 103/63  Pulse: (!) 55  Resp: 18  Temp: 97.8 F (36.6 C)     Body mass index is 28.66 kg/m.    ECOG FS:0 - Asymptomatic  General: Well-developed, well-nourished, no acute distress. Eyes: Pink conjunctiva, anicteric sclera. HEENT: Normocephalic, moist mucous membranes. Lungs: Clear to auscultation bilaterally. Heart: Regular rate and rhythm. No rubs, murmurs, or gallops. Abdomen: Soft, nontender, nondistended. No organomegaly noted, normoactive bowel sounds. Musculoskeletal: No edema, cyanosis, or clubbing. Neuro: Alert, answering all questions appropriately. Cranial nerves grossly intact. Skin: No rashes or petechiae noted. Psych: Normal affect.   LAB RESULTS:  Lab Results    Component Value Date   NA 130 (L) 04/05/2018   K 4.5 04/05/2018   CL 96 (L) 04/05/2018   CO2 29 04/05/2018   GLUCOSE 134 (H) 04/05/2018   BUN 18 04/05/2018   CREATININE 1.00 07/02/2018   CALCIUM 8.5 (L) 04/05/2018   PROT 6.8 11/06/2017   ALBUMIN 3.9 11/06/2017   AST 30 11/06/2017   ALT 28 11/06/2017   ALKPHOS 80 11/06/2017   BILITOT 0.9 11/06/2017   GFRNONAA >60 04/05/2018   GFRAA >60 04/05/2018    Lab Results  Component Value Date   WBC 4.6 04/05/2018   NEUTROABS 5.8 10/30/2017   HGB 14.4 04/05/2018   HCT 42.6 04/05/2018   MCV 89.7 04/05/2018   PLT 148 (L) 04/05/2018     STUDIES: Ct Chest W Contrast  Result Date: 07/02/2018 CLINICAL DATA:  Right lower lobe clinical stage IIB adenocarcinoma of the right lung diagnosed 02/11/2018 post radiation therapy. EXAM: CT CHEST WITH CONTRAST TECHNIQUE: Multidetector CT imaging of the chest was performed during intravenous contrast administration. CONTRAST:  175m ISOVUE-300 IOPAMIDOL (ISOVUE-300) INJECTION 61% COMPARISON:  Chest CT 01/07/2018 and 11/06/2017.  PET-CT 09/25/2017. FINDINGS: Cardiovascular: There is extensive atherosclerosis of the aorta, great vessels and coronary arteries status post median sternotomy and CABG. No acute vascular findings are seen. The heart size is normal. There is no pericardial effusion. Mediastinum/Nodes: There are no enlarged mediastinal, hilar or axillary lymph nodes. The thyroid gland, trachea and esophagus demonstrate no significant findings. Lungs/Pleura: There is no pleural effusion. Mild centrilobular and paraseptal emphysema. The tree-in-bud opacities in the right upper lobe seen on the most recent study have resolved. The ill-defined mass posteriorly at the right costophrenic angle is difficult to accurately measure given its location, but appears slightly smaller, measuring approximately 4.2 x 1.9 cm on image 100/3 (previously 5.2 x 2.0 cm). No evidence of chest wall invasion. There is chronic  atelectasis or scarring in the right middle and lower lobes. No new or enlarging pulmonary nodules are seen. Upper abdomen: The visualized upper abdomen appears stable without suspicious findings. Musculoskeletal/Chest wall: There is no chest wall mass or suspicious osseous finding. Chronic compression deformities at T4 and T10 are stable. Glenohumeral degenerative changes are present bilaterally. IMPRESSION: 1. Although difficult to accurately measure based on location, the right lower lobe mass appears slightly smaller. No disease  progression or distant metastases identified. 2. Resolved tree-in-bud opacities in the right upper lobe. 3. Aortic Atherosclerosis (ICD10-I70.0) and Emphysema (ICD10-J43.9). Electronically Signed   By: Richardean Sale M.D.   On: 07/02/2018 15:56    ASSESSMENT: Clinical stage IIB adenocarcinoma of the right lower lobe lung.  PLAN:    1. Clinical stage IIB adenocarcinoma of the right lower lobe lung: Imaging and biopsy results reviewed independently.  Patient was not a surgical candidate and given his advanced age chemotherapy is not recommended.  He completed XRT in April 2019.  Restaging CT scan on July 02, 2018 reviewed independently and report as above with no recurrent or progressive disease identified.  No intervention is needed at this time.  Return to clinic in 6 months with repeat imaging and further evaluation.    I spent a total of 20 minutes face-to-face with the patient of which greater than 50% of the visit was spent in counseling and coordination of care as detailed above.  Patient expressed understanding and was in agreement with this plan. He also understands that He can call clinic at any time with any questions, concerns, or complaints.   Cancer Staging Primary adenocarcinoma of lower lobe of right lung [facility]) Staging form: Lung, AJCC 8th Edition - Clinical stage from 02/16/2018: Stage IIB (cT3, cN0, cM0) - Signed by Lloyd Huger, MD on  02/16/2018   Lloyd Huger, MD   07/07/2018 11:21 AM

## 2018-07-01 ENCOUNTER — Telehealth: Payer: Self-pay | Admitting: Gastroenterology

## 2018-07-01 ENCOUNTER — Encounter
Admission: RE | Disposition: A | Discharge status: Home / Self Care | Payer: Self-pay | Source: Ambulatory Visit | Attending: Gastroenterology

## 2018-07-01 ENCOUNTER — Ambulatory Visit: Payer: Medicare Other | Admitting: Anesthesiology

## 2018-07-01 ENCOUNTER — Ambulatory Visit
Admission: RE | Admit: 2018-07-01 | Discharge: 2018-07-01 | Disposition: A | Discharge status: Home / Self Care | Payer: Medicare Other | Source: Ambulatory Visit | Attending: Gastroenterology | Admitting: Gastroenterology

## 2018-07-01 ENCOUNTER — Encounter: Payer: Self-pay | Admitting: *Deleted

## 2018-07-01 DIAGNOSIS — I10 Essential (primary) hypertension: Secondary | ICD-10-CM | POA: Insufficient documentation

## 2018-07-01 DIAGNOSIS — Z79899 Other long term (current) drug therapy: Secondary | ICD-10-CM | POA: Insufficient documentation

## 2018-07-01 DIAGNOSIS — J449 Chronic obstructive pulmonary disease, unspecified: Secondary | ICD-10-CM | POA: Diagnosis not present

## 2018-07-01 DIAGNOSIS — R1314 Dysphagia, pharyngoesophageal phase: Secondary | ICD-10-CM | POA: Insufficient documentation

## 2018-07-01 DIAGNOSIS — I2510 Atherosclerotic heart disease of native coronary artery without angina pectoris: Secondary | ICD-10-CM | POA: Insufficient documentation

## 2018-07-01 DIAGNOSIS — I5022 Chronic systolic (congestive) heart failure: Secondary | ICD-10-CM | POA: Diagnosis not present

## 2018-07-01 DIAGNOSIS — Z7901 Long term (current) use of anticoagulants: Secondary | ICD-10-CM | POA: Diagnosis not present

## 2018-07-01 DIAGNOSIS — Z951 Presence of aortocoronary bypass graft: Secondary | ICD-10-CM | POA: Diagnosis not present

## 2018-07-01 DIAGNOSIS — M1990 Unspecified osteoarthritis, unspecified site: Secondary | ICD-10-CM | POA: Diagnosis not present

## 2018-07-01 DIAGNOSIS — Z981 Arthrodesis status: Secondary | ICD-10-CM | POA: Diagnosis not present

## 2018-07-01 DIAGNOSIS — E785 Hyperlipidemia, unspecified: Secondary | ICD-10-CM | POA: Diagnosis not present

## 2018-07-01 DIAGNOSIS — Z8249 Family history of ischemic heart disease and other diseases of the circulatory system: Secondary | ICD-10-CM | POA: Diagnosis not present

## 2018-07-01 DIAGNOSIS — Z8719 Personal history of other diseases of the digestive system: Secondary | ICD-10-CM | POA: Diagnosis not present

## 2018-07-01 DIAGNOSIS — Z8673 Personal history of transient ischemic attack (TIA), and cerebral infarction without residual deficits: Secondary | ICD-10-CM | POA: Insufficient documentation

## 2018-07-01 DIAGNOSIS — Z8546 Personal history of malignant neoplasm of prostate: Secondary | ICD-10-CM | POA: Insufficient documentation

## 2018-07-01 DIAGNOSIS — I480 Paroxysmal atrial fibrillation: Secondary | ICD-10-CM | POA: Insufficient documentation

## 2018-07-01 DIAGNOSIS — K219 Gastro-esophageal reflux disease without esophagitis: Secondary | ICD-10-CM | POA: Insufficient documentation

## 2018-07-01 DIAGNOSIS — Z87891 Personal history of nicotine dependence: Secondary | ICD-10-CM | POA: Diagnosis not present

## 2018-07-01 DIAGNOSIS — Z96642 Presence of left artificial hip joint: Secondary | ICD-10-CM | POA: Insufficient documentation

## 2018-07-01 DIAGNOSIS — I110 Hypertensive heart disease with heart failure: Secondary | ICD-10-CM | POA: Diagnosis not present

## 2018-07-01 DIAGNOSIS — R1310 Dysphagia, unspecified: Secondary | ICD-10-CM

## 2018-07-01 DIAGNOSIS — Z888 Allergy status to other drugs, medicaments and biological substances status: Secondary | ICD-10-CM | POA: Insufficient documentation

## 2018-07-01 DIAGNOSIS — N400 Enlarged prostate without lower urinary tract symptoms: Secondary | ICD-10-CM | POA: Insufficient documentation

## 2018-07-01 HISTORY — PX: ESOPHAGOGASTRODUODENOSCOPY (EGD) WITH PROPOFOL: SHX5813

## 2018-07-01 SURGERY — ESOPHAGOGASTRODUODENOSCOPY (EGD) WITH PROPOFOL
Anesthesia: General

## 2018-07-01 MED ORDER — PROPOFOL 500 MG/50ML IV EMUL
INTRAVENOUS | Status: AC
  Filled 2018-07-01: qty 50

## 2018-07-01 MED ORDER — SODIUM CHLORIDE 0.9 % IV SOLN
INTRAVENOUS | Status: DC
  Administered 2018-07-01: 13:00:00 via INTRAVENOUS

## 2018-07-01 MED ORDER — PROPOFOL 10 MG/ML IV BOLUS
INTRAVENOUS | Status: DC | PRN
  Administered 2018-07-01: 80 mg via INTRAVENOUS
  Administered 2018-07-01 (×2): 20 mg via INTRAVENOUS

## 2018-07-01 NOTE — Telephone Encounter (Signed)
Pt had procedure today and would like to know if Dr. Marius Ditch found or removed anything please call pt

## 2018-07-01 NOTE — Op Note (Addendum)
[facility] Patient Name: James [name] Procedure Date: 07/01/2018 12:52 PM MRN: [id number] Account #: [id number] Date of Birth: 23-Nov-1929 Admit Type: Outpatient Age: 84 Room: [facility] ENDO ROOM 2 Gender: Male Note Status: Finalized Procedure:            Upper GI endoscopy Indications:          Esophageal dysphagia Providers:            Lin Landsman MD, MD Referring MD:         Kirstie Peri. Caryn Section, MD (Referring MD) Medicines:            Monitored Anesthesia Care Complications:        No immediate complications. Estimated blood loss: None. Procedure:            Pre-Anesthesia Assessment:                       - Prior to the procedure, a History and Physical was                        performed, and patient medications and allergies were                        reviewed. The patient is competent. The risks and                        benefits of the procedure and the sedation options and                        risks were discussed with the patient. All questions                        were answered and informed consent was obtained.                        Patient identification and proposed procedure were                        verified by the physician, the nurse, the                        anesthesiologist, the anesthetist and the technician in                        the pre-procedure area in the procedure room in the                        endoscopy suite. Mental Status Examination: alert and                        oriented. Airway Examination: normal oropharyngeal                        airway and neck mobility. Respiratory Examination:                        clear to auscultation. CV Examination: normal.                        Prophylactic Antibiotics: The patient does not require  prophylactic antibiotics. Prior Anticoagulants: The                        patient has taken Eliquis (apixaban), last dose was 2       days prior to procedure. ASA Grade Assessment: III - A                        patient with severe systemic disease. After reviewing                        the risks and benefits, the patient was deemed in                        satisfactory condition to undergo the procedure. The                        anesthesia plan was to use monitored anesthesia care                        (MAC). Immediately prior to administration of                        medications, the patient was re-assessed for adequacy                        to receive sedatives. The heart rate, respiratory rate,                        oxygen saturations, blood pressure, adequacy of                        pulmonary ventilation, and response to care were                        monitored throughout the procedure. The physical status                        of the patient was re-assessed after the procedure.                       After obtaining informed consent, the endoscope was                        passed under direct vision. Throughout the procedure,                        the patient's blood pressure, pulse, and oxygen                        saturations were monitored continuously. The Endoscope                        was introduced through the mouth, and advanced to the                        second part of duodenum. The upper GI endoscopy was                        accomplished without difficulty. The patient tolerated  the procedure well. Findings:      The gastroesophageal junction and examined esophagus were normal.      The entire examined stomach was normal.      The cardia and gastric fundus were normal on retroflexion.      The duodenal bulb and second portion of the duodenum were normal. Impression:           - Normal gastroesophageal junction and esophagus.                       - Normal stomach.                       - Normal duodenal bulb and second portion of the                         duodenum.                       - No specimens collected. Recommendation:       - Discharge patient to home (with spouse).                       - Chopped diet.                       - Continue present medications.                       - Resume Eliquis (apixaban) at prior dose today. Refer                        to referring physician for further adjustment of                        therapy. Procedure Code(s):    --- Professional ---                       [phone], Esophagogastroduodenoscopy, flexible, transoral;                        diagnostic, including collection of specimen(s) by                        brushing or washing, when performed (separate procedure) Diagnosis Code(s):    --- Professional ---                       R13.14, Dysphagia, pharyngoesophageal phase CPT copyright 2017 American Medical Association. All rights reserved. The codes documented in this report are preliminary and upon coder review may  be revised to meet current compliance requirements. Dr. Ulyess Mort Lin Landsman MD, MD 07/01/2018 1:11:59 PM This report has been signed electronically. Number of Addenda: 0 Note Initiated On: 07/01/2018 12:52 PM      [facility]

## 2018-07-01 NOTE — Anesthesia Post-op Follow-up Note (Signed)
Anesthesia QCDR form completed.        

## 2018-07-01 NOTE — Transfer of Care (Signed)
Immediate Anesthesia Transfer of Care Note  Patient: James [name]  Procedure(s) Performed: ESOPHAGOGASTRODUODENOSCOPY (EGD) WITH PROPOFOL (N/A )  Patient Location: Endoscopy Unit  Anesthesia Type:General  Level of Consciousness: drowsy and patient cooperative  Airway & Oxygen Therapy: Patient Spontanous Breathing and Patient connected to nasal cannula oxygen  Post-op Assessment: Report given to RN and Post -op Vital signs reviewed and stable  Post vital signs: Reviewed and stable  Last Vitals:  Vitals Value Taken Time  BP 124/82 07/01/2018  1:17 PM  Temp    Pulse 48 07/01/2018  1:19 PM  Resp 13 07/01/2018  1:19 PM  SpO2 99 % 07/01/2018  1:19 PM  Vitals shown include unvalidated device data.  Last Pain:  Vitals:   07/01/18 1036  TempSrc: Tympanic  PainSc: 0-No pain         Complications: No apparent anesthesia complications

## 2018-07-01 NOTE — H&P (Signed)
James Darby, MD [address]  [place]  [place], [place] [address]  Main: [phone]  Fax: [phone] Pager: [phone]  Primary Care Physician:  Birdie Sons, MD Primary Gastroenterologist:  Dr. Cephas [name]  Pre-Procedure History & Physical: HPI:  James [name] is a 84 y.o. male is here for an endoscopy.   Past Medical History:  Diagnosis Date  . Arthritis   . Back pain   . CAD (coronary artery disease)   . Cataract    right  . COPD (chronic obstructive pulmonary disease) ([place])    SPiriva and SYmbicort daily. Albuterol as needed  . Diverticulosis   . Dyspnea    with exertion  . Enlarged prostate    takes Flomax daily  . GERD (gastroesophageal reflux disease)   . History of chicken pox   . History of gout   . History of measles   . History of mumps   . HOH (hard of hearing)   . Hyperglycemia   . Hyperlipidemia    takes Atorvastatin daily  . Hypertension    takes Metoprolol daily as well as Lotensin HCT  . Hypotension   . Joint pain   . Microscopic colitis   . PAF (paroxysmal atrial fibrillation) ([place]), RVR 03/14/2017  . Pneumonia   . Prostate cancer ([name])   . Stroke [facility])    TIA  . TIA (transient ischemic attack)   . Weakness    numbness and tingling.mainly on right    Past Surgical History:  Procedure Laterality Date  . ANTERIOR CERVICAL DECOMP/DISCECTOMY FUSION N/A 03/07/2017   Procedure: ANTERIOR CERVICAL DECOMPRESSION/DISCECTOMY FUSION CERVICAL THREE- CERVICAL FOUR, CERVICAL FOUR- CERVICAL FIVE;  Surgeon: Newman Pies, MD;  Location: [facility];  Service: Neurosurgery;  Laterality: N/A;  ANTERIOR CERVICAL DECOMPRESSION/DISCECTOMY FUSION CERVICAL 3- CERVICAL 4, CERVIACL 4- CERVICAL 5  . APPENDECTOMY  1950  . CARDIAC CATHETERIZATION  05/29/2013   EF=40-45%. Moderate pulmonary hypertension. Infero/ lateral hypokinesis  . cataract surgery Left   . COLONOSCOPY    . CORONARY ARTERY BYPASS GRAFT  2011   x 5  . EYE SURGERY    . FLEXIBLE  BRONCHOSCOPY N/A 11/19/2017   Procedure: FLEXIBLE BRONCHOSCOPY;  Surgeon: Laverle Hobby, MD;  Location: [facility] ORS;  Service: Pulmonary;  Laterality: N/A;  . MRI BRAIN  06/07/2013   Mild chronic involutional changes. No acute abnormalities  . MRI of neck    . Myocardial Perfusion Scan  05/29/2013   Abnormal myocardial perfusion image consistent with myocardial infarction  . PROSTATE SURGERY  11/18/2012   radiation seed  . TOTAL HIP ARTHROPLASTY Left 1997   hip joint replacement    Prior to Admission medications   Medication Sig Start Date End Date Taking? Authorizing Provider  albuterol (PROAIR HFA) 108 (90 Base) MCG/ACT inhaler INHALE 2 PUFFS INTO LUNGS EVERY 6 HOURS AS NEEDED FOR WHEEZING OR SHORTNESS OF BREATH 11/15/17  Yes Laverle Hobby, MD  atorvastatin (LIPITOR) 80 MG tablet TAKE 1 TABLET BY MOUTH DAILY 10/08/17  Yes Birdie Sons, MD  azelastine (OPTIVAR) 0.05 % ophthalmic solution APPLY 1 DROP TO EYE TWICE DAILY 12/08/17  Yes Birdie Sons, MD  benazepril-hydrochlorthiazide (LOTENSIN HCT) 20-12.5 MG tablet TAKE ONE TABLET BY MOUTH EVERY DAY 01/30/18  Yes Birdie Sons, MD  beta carotene w/minerals (OCUVITE) tablet Take 1 tablet by mouth daily.   Yes [provider]  chlorpheniramine-HYDROcodone (TUSSIONEX) 10-8 MG/5ML SUER TAKE 1 TEASPOONFUL EVERY 12 HOURS AS NEEDED FOR COUGH 04/01/18  Yes  Birdie Sons, MD  docusate sodium (COLACE) 100 MG capsule Take 1 capsule (100 mg total) by mouth 2 (two) times daily. Patient taking differently: Take 100 mg 2 (two) times daily as needed by mouth for mild constipation.  03/08/17  Yes Newman Pies, MD  ELIQUIS 5 MG TABS tablet TAKE ONE TABLET BY MOUTH TWICE DAILY 05/08/18  Yes Birdie Sons, MD  ipratropium (ATROVENT) 0.03 % nasal spray  04/26/18  Yes [provider]  lansoprazole (PREVACID) 30 MG capsule TAKE 1 CAPSULE BY MOUTH EVERY DAY 08/06/17  Yes Birdie Sons, MD  LORazepam (ATIVAN) 1 MG  tablet ONE-HALF TO 1 TABLET BY MOUTH AT [facility] NEEDED FOR SLEEP 03/19/18  Yes Birdie Sons, MD  Magnesium 400 MG CAPS Take 1 capsule by mouth daily.    Yes [provider]  metoprolol tartrate (LOPRESSOR) 50 MG tablet TAKE ONE TABLET BY MOUTH TWICE DAILY 11/13/17  Yes Birdie Sons, MD  montelukast (SINGULAIR) 10 MG tablet TAKE ONE TABLET BY MOUTH EVERY DAY 07/18/17  Yes Birdie Sons, MD  MULTIPLE VITAMIN PO Take 1 tablet by mouth daily.    Yes [provider]  Omega-3 Fatty Acids (FISH OIL) 1000 MG CAPS Take 1 capsule daily by mouth.   Yes [provider]  SPIRIVA HANDIHALER 18 MCG inhalation capsule INHALE THE CONTENTS OF ONE CAPSULE AS DIRECTED ONCE DAILY 01/30/18  Yes Birdie Sons, MD  SYMBICORT 160-4.5 MCG/ACT inhaler INHALE 2 PUFFS INTO THE LUNGS TWICE DAILY 07/23/17  Yes Birdie Sons, MD  tamsulosin (FLOMAX) 0.4 MG CAPS capsule TAKE 1 CAPSULE BY MOUTH DAILY 01/07/18  Yes Birdie Sons, MD  famotidine (PEPCID) 20 MG tablet  05/07/18   [provider]  fluticasone (FLONASE) 50 MCG/ACT nasal spray USE 2 SPRAYS IN EACH NOSTRIL DAILY 06/18/18   Birdie Sons, MD  HYDROcodone-acetaminophen (NORCO) 10-325 MG tablet Take 1 tablet by mouth every 6 (six) hours as needed. Patient not taking: Reported on 07/01/2018 07/10/17   Birdie Sons, MD    Allergies as of 06/14/2018 - Review Complete 06/14/2018  Allergen Reaction Noted  . Indomethacin Nausea Only 05/31/2015    Family History  Problem Relation Age of Onset  . Heart attack Mother   . Stroke Father   . Heart attack Father   . Hypertension Father   . Heart attack Sister   . Bladder Cancer Brother   . Kidney cancer Brother   . Heart Problems Brother     Social History   Socioeconomic History  . Marital status: Married    Spouse name: Not on file  . Number of children: 1  . Years of education: Not on file  . Highest education level: Not on file  Occupational History  .  Occupation: Retired  Scientific laboratory technician  . Financial resource strain: Not on file  . Food insecurity:    Worry: Not on file    Inability: Not on file  . Transportation needs:    Medical: Not on file    Non-medical: Not on file  Tobacco Use  . Smoking status: Former Smoker    Packs/day: 1.00    Years: 11.00    Pack years: 11.00    Types: Cigarettes    Last attempt to quit: 02/12/1984    Years since quitting: 34.4  . Smokeless tobacco: Never Used  . Tobacco comment: quit smoking 30 yrs ago  Substance and Sexual Activity  . Alcohol use: Yes  Alcohol/week: 0.0 oz    Comment: beer occasionally  . Drug use: No  . Sexual activity: Not on file  Lifestyle  . Physical activity:    Days per week: Not on file    Minutes per session: Not on file  . Stress: Not on file  Relationships  . Social connections:    Talks on phone: Not on file    Gets together: Not on file    Attends religious service: Not on file    Active member of club or organization: Not on file    Attends meetings of clubs or organizations: Not on file    Relationship status: Not on file  . Intimate partner violence:    Fear of current or ex partner: Not on file    Emotionally abused: Not on file    Physically abused: Not on file    Forced sexual activity: Not on file  Other Topics Concern  . Not on file  Social History Narrative   Lives in [place], [place].    Review of Systems: See HPI, otherwise negative ROS  Physical Exam: BP 116/84   Pulse (!) 58   Temp (!) 96.6 F (35.9 C) (Tympanic)   Resp 18   Ht 5' 8.5" (1.74 m)   Wt 185 lb (83.9 kg)   SpO2 98%   BMI 27.72 kg/m  General:   Alert,  pleasant and cooperative in NAD Head:  Normocephalic and atraumatic. Neck:  Supple; no masses or thyromegaly. Lungs:  Clear throughout to auscultation.    Heart:  Regular rate and rhythm. Abdomen:  Soft, nontender and nondistended. Normal bowel sounds, without guarding, and without rebound.   Neurologic:  Alert and   oriented x4;  grossly normal neurologically.  Impression/Plan: MONTI JILEK is here for an endoscopy to be performed for dysphagia  Risks, benefits, limitations, and alternatives regarding  endoscopy have been reviewed with the patient.  Questions have been answered.  All parties agreeable.   Sherri Sear, MD  07/01/2018, 11:43 AM

## 2018-07-01 NOTE — Anesthesia Preprocedure Evaluation (Signed)
Anesthesia Evaluation  Patient identified by MRN, date of birth, ID band Patient awake    Reviewed: Allergy & Precautions, H&P , NPO status , Patient's Chart, lab work & pertinent test results, reviewed documented beta blocker date and time   History of Anesthesia Complications Negative for: history of anesthetic complications  Airway Mallampati: III  TM Distance: >3 FB Neck ROM: full    Dental  (+) Dental Advidsory Given, Missing, Chipped, Poor Dentition   Pulmonary neg pulmonary ROS, shortness of breath and with exertion, COPD,  COPD inhaler, neg recent URI, former smoker,           Cardiovascular Exercise Tolerance: Good hypertension, (-) angina+ CAD and + CABG  (-) Past MI and (-) Cardiac Stents + dysrhythmias Atrial Fibrillation (-) Valvular Problems/Murmurs     Neuro/Psych neg Seizures TIAnegative psych ROS   GI/Hepatic Neg liver ROS, GERD  ,  Endo/Other  negative endocrine ROS  Renal/GU negative Renal ROS  negative genitourinary   Musculoskeletal   Abdominal   Peds  Hematology negative hematology ROS (+)   Anesthesia Other Findings Past Medical History: No date: Arthritis No date: Back pain No date: CAD (coronary artery disease) No date: Cataract     Comment:  right No date: COPD (chronic obstructive pulmonary disease) ([facility])     Comment:  SPiriva and SYmbicort daily. Albuterol as needed No date: Diverticulosis No date: Dyspnea     Comment:  with exertion No date: Enlarged prostate     Comment:  takes Flomax daily No date: GERD (gastroesophageal reflux disease) No date: History of chicken pox No date: History of gout No date: History of measles No date: History of mumps No date: HOH (hard of hearing) No date: Hyperglycemia No date: Hyperlipidemia     Comment:  takes Atorvastatin daily No date: Hypertension     Comment:  takes Metoprolol daily as well as Lotensin HCT No date: Hypotension No date:  Joint pain No date: Microscopic colitis 03/14/2017: PAF (paroxysmal atrial fibrillation) ([place]), RVR No date: Pneumonia No date: Prostate cancer ([place]) No date: Stroke [facility])     Comment:  TIA No date: TIA (transient ischemic attack) No date: Weakness     Comment:  numbness and tingling.mainly on right   Reproductive/Obstetrics negative OB ROS                             Anesthesia Physical Anesthesia Plan  ASA: III  Anesthesia Plan: General   Post-op Pain Management:    Induction: Intravenous  PONV Risk Score and Plan: 2 and Propofol infusion and TIVA  Airway Management Planned: Nasal Cannula  Additional Equipment:   Intra-op Plan:   Post-operative Plan:   Informed Consent: I have reviewed the patients History and Physical, chart, labs and discussed the procedure including the risks, benefits and alternatives for the proposed anesthesia with the patient or authorized representative who has indicated his/her understanding and acceptance.   Dental Advisory Given  Plan Discussed with: Anesthesiologist, CRNA and Surgeon  Anesthesia Plan Comments:         Anesthesia Quick Evaluation

## 2018-07-02 ENCOUNTER — Ambulatory Visit
Admission: RE | Admit: 2018-07-02 | Discharge: 2018-07-02 | Disposition: A | Discharge status: Home / Self Care | Payer: Medicare Other | Source: Ambulatory Visit | Attending: Oncology | Admitting: Oncology

## 2018-07-02 ENCOUNTER — Encounter: Payer: Self-pay | Admitting: Gastroenterology

## 2018-07-02 DIAGNOSIS — C3490 Malignant neoplasm of unspecified part of unspecified bronchus or lung: Secondary | ICD-10-CM | POA: Diagnosis not present

## 2018-07-02 DIAGNOSIS — R918 Other nonspecific abnormal finding of lung field: Secondary | ICD-10-CM | POA: Diagnosis not present

## 2018-07-02 DIAGNOSIS — J439 Emphysema, unspecified: Secondary | ICD-10-CM | POA: Insufficient documentation

## 2018-07-02 DIAGNOSIS — I700 Atherosclerosis of aorta: Secondary | ICD-10-CM | POA: Insufficient documentation

## 2018-07-02 LAB — POCT I-STAT CREATININE: Creatinine, Ser: 1 mg/dL (ref 0.61–1.24)

## 2018-07-02 MED ORDER — IOPAMIDOL (ISOVUE-300) INJECTION 61%
75.0000 mL | Freq: Once | INTRAVENOUS | Status: AC | PRN
  Administered 2018-07-02: 100 mL via INTRAVENOUS

## 2018-07-02 NOTE — Anesthesia Postprocedure Evaluation (Signed)
Anesthesia Post Note  Patient: James [name]  Procedure(s) Performed: ESOPHAGOGASTRODUODENOSCOPY (EGD) WITH PROPOFOL (N/A )  Patient location during evaluation: Endoscopy Anesthesia Type: General Level of consciousness: awake and alert Pain management: pain level controlled Vital Signs Assessment: post-procedure vital signs reviewed and stable Respiratory status: spontaneous breathing, nonlabored ventilation, respiratory function stable and patient connected to nasal cannula oxygen Cardiovascular status: blood pressure returned to baseline and stable Postop Assessment: no apparent nausea or vomiting Anesthetic complications: no     Last Vitals:  Vitals:   07/01/18 1325 07/01/18 1335  BP: 107/66 105/85  Pulse:  70  Resp:  16  Temp:    SpO2:      Last Pain:  Vitals:   07/02/18 0753  TempSrc:   PainSc: 0-No pain                 Martha Clan

## 2018-07-02 NOTE — Telephone Encounter (Signed)
Pt has been notified of results and no specimen was collected, pt verbalized understanding

## 2018-07-03 ENCOUNTER — Encounter: Payer: Self-pay | Admitting: Oncology

## 2018-07-03 ENCOUNTER — Inpatient Hospital Stay: Payer: Medicare Other | Attending: Oncology | Admitting: Oncology

## 2018-07-03 ENCOUNTER — Other Ambulatory Visit: Payer: Self-pay

## 2018-07-03 ENCOUNTER — Ambulatory Visit
Admission: RE | Admit: 2018-07-03 | Discharge: 2018-07-03 | Disposition: A | Discharge status: Home / Self Care | Payer: Medicare Other | Source: Ambulatory Visit | Attending: Radiation Oncology | Admitting: Radiation Oncology

## 2018-07-03 ENCOUNTER — Other Ambulatory Visit: Payer: Self-pay | Admitting: *Deleted

## 2018-07-03 VITALS — BP 103/63 | HR 55 | Temp 97.8°F | Resp 18 | Wt 191.3 lb

## 2018-07-03 VITALS — BP 113/69 | HR 59 | Temp 96.1°F | Resp 18 | Wt 190.9 lb

## 2018-07-03 DIAGNOSIS — Z923 Personal history of irradiation: Secondary | ICD-10-CM | POA: Diagnosis not present

## 2018-07-03 DIAGNOSIS — Z87891 Personal history of nicotine dependence: Secondary | ICD-10-CM | POA: Insufficient documentation

## 2018-07-03 DIAGNOSIS — C3431 Malignant neoplasm of lower lobe, right bronchus or lung: Secondary | ICD-10-CM | POA: Diagnosis not present

## 2018-07-03 DIAGNOSIS — Z8546 Personal history of malignant neoplasm of prostate: Secondary | ICD-10-CM | POA: Diagnosis not present

## 2018-07-03 DIAGNOSIS — R911 Solitary pulmonary nodule: Secondary | ICD-10-CM

## 2018-07-03 NOTE — Progress Notes (Signed)
Here for f/u per pt" not excellent but doing good"  Occasional cough

## 2018-07-03 NOTE — Progress Notes (Signed)
Radiation Oncology Follow up Note  Name: James [name]   Date:   07/03/2018 MRN:  [id number] DOB: 11-03-1929    This 84 y.o. male presents to the clinic today for three-month follow-up status post radiation therapy for stage IIB adenocarcinoma the right lower lobe.  REFERRING PROVIDER: Birdie Sons, MD  HPI: patient is a 84 year old male now out over 3 months having completed a course of radiation therapy to his right lower lobe for stage IIb (T3 N0 M0) adenocarcinoma. He is also been treated in the past for prostate cancer. He is seen today in routine follow-up is doing well specifically denies dysphagia cough hemoptysis or chest tightness..he had a CT scan yesterday of his chest showing right lower lobe mass appearing slightly smaller with no evidence of disease progression or dust distant metastatic disease.  COMPLICATIONS OF TREATMENT: none  FOLLOW UP COMPLIANCE: keeps appointments   PHYSICAL EXAM:  BP 113/69 (BP Location: Right Arm, Patient Position: Sitting, Cuff Size: Normal)   Pulse (!) 59   Temp (!) 96.1 F (35.6 C) (Tympanic)   Resp 18   Wt 190 lb 14.7 oz (86.6 kg)   BMI 28.61 kg/m  Well-developed well-nourished patient in NAD. HEENT reveals PERLA, EOMI, discs not visualized.  Oral cavity is clear. No oral mucosal lesions are identified. Neck is clear without evidence of cervical or supraclavicular adenopathy. Lungs are clear to A&P. Cardiac examination is essentially unremarkable with regular rate and rhythm without murmur rub or thrill. Abdomen is benign with no organomegaly or masses noted. Motor sensory and DTR levels are equal and symmetric in the upper and lower extremities. Cranial nerves II through XII are grossly intact. Proprioception is intact. No peripheral adenopathy or edema is identified. No motor or sensory levels are noted. Crude visual fields are within normal range.  RADIOLOGY RESULTS: CT scan reviewed and compatible above-stated findings  PLAN: present  time patient is doing well now over 3 months out from radiation therapy. I'm please was overall progress. I reviewed his films with him. I've asked to see him back in 6 months for follow-up will obtain a CT scan prior to that visit. He continues close follow-up care with medical oncology. Patient is to call with any concerns at any time.  I would like to take this opportunity to thank you for allowing me to participate in the care of your patient.[name], MD

## 2018-07-03 NOTE — Progress Notes (Signed)
Patient here for follow up. No concerns voiced.  °

## 2018-07-17 ENCOUNTER — Telehealth: Payer: Self-pay | Admitting: Family Medicine

## 2018-07-17 NOTE — Telephone Encounter (Signed)
[facility] called saying they sent a fax asking for the patient to be off his Eliquis because he is having eye surgery.  The form was sent back signed but was not checked off that it is ok for him to stop taking the eliquis during surgery.  They just need a verbal ok  Their number is [phone]  Ask for Anda Kraft  They need this asap  He has surgery tomorrow  Thanks Con Memos

## 2018-07-17 NOTE — Telephone Encounter (Signed)
Yes, he can stop it before his surgery.

## 2018-07-17 NOTE — Telephone Encounter (Signed)
Verbal okay given to [facility] with [facility].

## 2018-07-18 DIAGNOSIS — H02045 Spastic entropion of left lower eyelid: Secondary | ICD-10-CM | POA: Diagnosis not present

## 2018-07-21 ENCOUNTER — Other Ambulatory Visit: Payer: Self-pay | Admitting: Family Medicine

## 2018-08-10 ENCOUNTER — Other Ambulatory Visit: Payer: Self-pay | Admitting: Family Medicine

## 2018-08-29 ENCOUNTER — Other Ambulatory Visit: Payer: Self-pay | Admitting: Family Medicine

## 2018-08-29 DIAGNOSIS — M542 Cervicalgia: Secondary | ICD-10-CM

## 2018-08-29 DIAGNOSIS — I2510 Atherosclerotic heart disease of native coronary artery without angina pectoris: Secondary | ICD-10-CM | POA: Diagnosis not present

## 2018-08-29 DIAGNOSIS — I10 Essential (primary) hypertension: Secondary | ICD-10-CM | POA: Diagnosis not present

## 2018-08-29 DIAGNOSIS — I5022 Chronic systolic (congestive) heart failure: Secondary | ICD-10-CM | POA: Diagnosis not present

## 2018-08-29 DIAGNOSIS — I480 Paroxysmal atrial fibrillation: Secondary | ICD-10-CM | POA: Diagnosis not present

## 2018-09-01 ENCOUNTER — Other Ambulatory Visit: Payer: Self-pay | Admitting: Family Medicine

## 2018-09-02 NOTE — Progress Notes (Signed)
[name]     Assessment and Plan:   Non-small cell lung cancer --Stage IIIb lung cancer.  Status post radiation. - Currently doing well, continue to follow-up with oncology.  COPD. --Currently on Spiriva, Symbicort, will change to trilogy inhaler, prescription given today. --s/p Prevnar-13 vaccine. - We will give flu vaccine today.   Dyspnea on exertion with deconditioning/debility. --Progressive dyspnea over the past 6 months, I suspect some of this may be due to deconditioning debility as well as his underlying severe emphysema/COPD. - We will refer to pulmonary rehab.  Elevated right diaphragm. --Present for many years, likely contributing to dyspnea.   Chronic systolic cardiac dysfunction. --Stable EF of 40%.   Dyspnea.  --Dyspnea is likely multifactorial due to all of the above issues.  --Continue current inhalers.   Orders Placed This Encounter  Procedures  . DG Chest 2 View  . AMB referral to pulmonary rehabilitation   Meds ordered this encounter  Medications  . Fluticasone-Umeclidin-Vilant (TRELEGY ELLIPTA) 100-62.5-25 MCG/INH AEPB    Sig: Inhale 1 applicator into the lungs daily. Rinse mouth after use. Stop symbicort and spiriva when taking this medicine.    Dispense:  1 each    Refill:  10   Return in about 6 months (around 03/04/2019).   Date: 09/02/2018  MRN# [id number] James [name] Jan 02, 1929   James [name] is a 84 y.o. old male seen in consultation for chief complaint of:    Chief Complaint  Patient presents with  . Follow-up    CT results: SOB worse:     HPI:   The patient is a 84 year old male COPD, systolic congestive heart failure, elevated diaphragm, progressive dyspnea.  He was noted to have a right lower lobe mass diagnosed in December of 2018 as non-small cell lung cancer, stage IIIb.  Subsequently underwent radiation therapy which was completed in April 2019.  He was deemed not a candidate for chemotherapy due to  his advanced age. At last visit he was asked to continue Spiriva, Symbicort.  Today he finds that he seems to run out of breath early. He runs out of breath with mild activity such showering and getting dressed. This appears to have progressed from 6 months ago and appears to be slowly progressive. He continues on spiriva and symbicort. He rinses mouth after symbicort.  He has gone to pulmonary rehab but thinks he has not completed all of his sessions.   He has cats and dogs at home, seldom in the bedroom.  Review of Dr. Bethanne Ginger records: He is noted to have controlled Afib, most recent echocardiogram showed stable systolic function of about 40%.   **CT chest 07/02/2018>> images personally reviewed, in comparison with previous images from 01/07/2018, there remains right lower lobe atelectasis with elevated right diaphragm.  The previously seen right lower lobe nodule/mass is significantly improved appears to have resolved and discard.  No significant new findings are noted. CT chest on 03/13/17>> this showed chronic interstitial changes, a 3 cm mass-like opacity on posterior right costophrenic sulcus. He was recently sent for a repeat CT on 09/17/17. On my personal review, there is RLL atelectasis with elevated right diaphragm. There is mass in the RLL posteriorly, this appears to be slightly larger than previous scan in March 2018. **CXR  05/22/16; elevated right diaphragm, stable for several years.  **Spirometry; 09/28/16; restriction with FEV1=48% **Spirometry 05/25/16; FEV1 equals 52%.   Medication:    Reviewed.     Allergies:  Indomethacin  Review  of Systems:  Constitutional: Feels well. Cardiovascular: No chest pain.  Pulmonary: Denies dyspnea.   The remainder of systems were reviewed and were found to be negative other than what is documented in the HPI.    Physical Examination:   VS: BP 92/60 (BP Location: Left Arm, Cuff Size: Normal)   Pulse 69   Ht 5' 8.5" (1.74 m)   Wt 186 lb  (84.4 kg)   SpO2 95%   BMI 27.87 kg/m   General Appearance: No distress  Neuro:without focal findings, mental status, speech normal, alert and oriented HEENT: PERRLA, EOM intact Pulmonary: No wheezing, No rales  CardiovascularNormal S1,S2.  No m/r/g.  Abdomen: Benign, Soft, non-tender, No masses Renal:  No costovertebral tenderness  GU:  No performed at this time. Endoc: No evident thyromegaly, no signs of acromegaly or Cushing features Skin:   warm, no rashes, no ecchymosis  Extremities: normal, no cyanosis, clubbing.      LABORATORY PANEL:   CBC No results for input(s): WBC, HGB, HCT, PLT in the last 168 hours. ------------------------------------------------------------------------------------------------------------------  Chemistries  No results for input(s): NA, K, CL, CO2, GLUCOSE, BUN, CREATININE, CALCIUM, MG, AST, ALT, ALKPHOS, BILITOT in the last 168 hours.  Invalid input(s): GFRCGP ------------------------------------------------------------------------------------------------------------------  Cardiac Enzymes No results for input(s): TROPONINI in the last 168 hours. ------------------------------------------------------------  RADIOLOGY:  No results found.     Thank  you for the consultation and for allowing [facility] to assist in the care of your patient. Our recommendations are noted above.  Please contact us if we can be of further service.  Marda Stalker, M.D., F.C.C.P.  Board Certified in Internal Medicine, Pulmonary Medicine, [place], and Sleep Medicine.  [facility] Office Number: [phone]   09/02/2018

## 2018-09-03 ENCOUNTER — Ambulatory Visit: Payer: Medicare Other | Admitting: Internal Medicine

## 2018-09-03 ENCOUNTER — Encounter: Payer: Self-pay | Admitting: Internal Medicine

## 2018-09-03 VITALS — BP 92/60 | HR 69 | Ht 68.5 in | Wt 186.0 lb

## 2018-09-03 DIAGNOSIS — C3431 Malignant neoplasm of lower lobe, right bronchus or lung: Secondary | ICD-10-CM | POA: Diagnosis not present

## 2018-09-03 DIAGNOSIS — R0600 Dyspnea, unspecified: Secondary | ICD-10-CM

## 2018-09-03 DIAGNOSIS — Z23 Encounter for immunization: Secondary | ICD-10-CM

## 2018-09-03 DIAGNOSIS — R0609 Other forms of dyspnea: Secondary | ICD-10-CM

## 2018-09-03 DIAGNOSIS — J449 Chronic obstructive pulmonary disease, unspecified: Secondary | ICD-10-CM

## 2018-09-03 MED ORDER — FLUTICASONE-UMECLIDIN-VILANT 100-62.5-25 MCG/INH IN AEPB: 1.0000 | INHALATION_SPRAY | Freq: Every day | RESPIRATORY_TRACT | 10 refills | Status: DC

## 2018-09-03 NOTE — Patient Instructions (Addendum)
Will change your current inhaler to Trelegy inhaler, try the sample, if notice that it helps, then can fill the prescription.  Will refer you to pulmonary rehab.  Will give you flu shot today.

## 2018-09-09 ENCOUNTER — Other Ambulatory Visit: Payer: Self-pay | Admitting: Family Medicine

## 2018-09-17 ENCOUNTER — Telehealth: Payer: Self-pay | Admitting: Internal Medicine

## 2018-09-17 ENCOUNTER — Ambulatory Visit
Admission: RE | Admit: 2018-09-17 | Discharge: 2018-09-17 | Disposition: A | Discharge status: Home / Self Care | Payer: Medicare Other | Source: Ambulatory Visit | Attending: Internal Medicine | Admitting: Internal Medicine

## 2018-09-17 DIAGNOSIS — J986 Disorders of diaphragm: Secondary | ICD-10-CM | POA: Diagnosis not present

## 2018-09-17 DIAGNOSIS — R0609 Other forms of dyspnea: Secondary | ICD-10-CM | POA: Insufficient documentation

## 2018-09-17 DIAGNOSIS — R0600 Dyspnea, unspecified: Secondary | ICD-10-CM | POA: Diagnosis present

## 2018-09-17 DIAGNOSIS — R0602 Shortness of breath: Secondary | ICD-10-CM | POA: Diagnosis not present

## 2018-09-17 NOTE — Telephone Encounter (Signed)
Spoke to patient re:x-ray. The order is in and he can get the x-ray at any time. Patient aware and states he "will take care of it today!" No further questions at this time.

## 2018-09-17 NOTE — Telephone Encounter (Signed)
Patient calling stating he saw Dr Ashby Dawes last month and was to receive a call about scheduling an X ray  He's not heard anything yet   Would like a call back with an update on this

## 2018-09-24 ENCOUNTER — Encounter: Payer: Medicare Other | Attending: Internal Medicine

## 2018-09-24 ENCOUNTER — Other Ambulatory Visit: Payer: Self-pay

## 2018-09-24 VITALS — Ht 64.5 in | Wt 186.3 lb

## 2018-09-24 DIAGNOSIS — Z87891 Personal history of nicotine dependence: Secondary | ICD-10-CM | POA: Insufficient documentation

## 2018-09-24 DIAGNOSIS — Z8673 Personal history of transient ischemic attack (TIA), and cerebral infarction without residual deficits: Secondary | ICD-10-CM | POA: Insufficient documentation

## 2018-09-24 DIAGNOSIS — I2510 Atherosclerotic heart disease of native coronary artery without angina pectoris: Secondary | ICD-10-CM | POA: Insufficient documentation

## 2018-09-24 DIAGNOSIS — K219 Gastro-esophageal reflux disease without esophagitis: Secondary | ICD-10-CM | POA: Insufficient documentation

## 2018-09-24 DIAGNOSIS — M1990 Unspecified osteoarthritis, unspecified site: Secondary | ICD-10-CM | POA: Insufficient documentation

## 2018-09-24 DIAGNOSIS — Z79899 Other long term (current) drug therapy: Secondary | ICD-10-CM | POA: Diagnosis not present

## 2018-09-24 DIAGNOSIS — J449 Chronic obstructive pulmonary disease, unspecified: Secondary | ICD-10-CM | POA: Insufficient documentation

## 2018-09-24 DIAGNOSIS — Z8546 Personal history of malignant neoplasm of prostate: Secondary | ICD-10-CM | POA: Insufficient documentation

## 2018-09-24 DIAGNOSIS — Z7951 Long term (current) use of inhaled steroids: Secondary | ICD-10-CM | POA: Insufficient documentation

## 2018-09-24 DIAGNOSIS — E785 Hyperlipidemia, unspecified: Secondary | ICD-10-CM | POA: Insufficient documentation

## 2018-09-24 DIAGNOSIS — R0609 Other forms of dyspnea: Secondary | ICD-10-CM | POA: Insufficient documentation

## 2018-09-24 DIAGNOSIS — Z7901 Long term (current) use of anticoagulants: Secondary | ICD-10-CM | POA: Diagnosis not present

## 2018-09-24 DIAGNOSIS — I10 Essential (primary) hypertension: Secondary | ICD-10-CM | POA: Insufficient documentation

## 2018-09-24 DIAGNOSIS — N400 Enlarged prostate without lower urinary tract symptoms: Secondary | ICD-10-CM | POA: Insufficient documentation

## 2018-09-24 DIAGNOSIS — R0600 Dyspnea, unspecified: Secondary | ICD-10-CM

## 2018-09-24 DIAGNOSIS — I480 Paroxysmal atrial fibrillation: Secondary | ICD-10-CM | POA: Insufficient documentation

## 2018-09-24 DIAGNOSIS — H9190 Unspecified hearing loss, unspecified ear: Secondary | ICD-10-CM | POA: Insufficient documentation

## 2018-09-24 NOTE — Progress Notes (Signed)
Pulmonary Individual Treatment Plan  Patient Details  Name: James [name] MRN: [id number] Date of Birth: Jul 07, 1929 Referring Provider:     Pulmonary Rehab from 09/24/2018 in [facility] Cardiac and Pulmonary Rehab  Referring Provider  Ramachandran      Initial Encounter Date:    Pulmonary Rehab from 09/24/2018 in [facility] Cardiac and Pulmonary Rehab  Date  09/24/18      Visit Diagnosis: DOE (dyspnea on exertion)  Patient's Home Medications on Admission:  Current Outpatient Medications:  .  albuterol (PROAIR HFA) 108 (90 Base) MCG/ACT inhaler, INHALE 2 PUFFS INTO LUNGS EVERY 6 HOURS AS NEEDED FOR WHEEZING OR SHORTNESS OF BREATH, Disp: 8.5 g, Rfl: 5 .  atorvastatin (LIPITOR) 80 MG tablet, TAKE 1 TABLET BY MOUTH DAILY, Disp: 90 tablet, Rfl: 4 .  azelastine (OPTIVAR) 0.05 % ophthalmic solution, APPLY 1 DROP TO EYE TWICE DAILY, Disp: 6 mL, Rfl: 5 .  benazepril-hydrochlorthiazide (LOTENSIN HCT) 20-12.5 MG tablet, TAKE ONE TABLET BY MOUTH EVERY DAY, Disp: 90 tablet, Rfl: 3 .  beta carotene w/minerals (OCUVITE) tablet, Take 1 tablet by mouth daily., Disp: , Rfl:  .  chlorpheniramine-HYDROcodone (TUSSIONEX) 10-8 MG/5ML SUER, TAKE 1 TEASPOONFUL EVERY 12 HOURS AS NEEDED FOR COUGH, Disp: 115 mL, Rfl: 0 .  ELIQUIS 5 MG TABS tablet, TAKE ONE TABLET BY MOUTH TWICE DAILY, Disp: 60 tablet, Rfl: 11 .  famotidine (PEPCID) 20 MG tablet, , Disp: , Rfl: 1 .  fluticasone (FLONASE) 50 MCG/ACT nasal spray, USE 2 SPRAYS IN EACH NOSTRIL DAILY, Disp: 48 g, Rfl: 3 .  Fluticasone-Umeclidin-Vilant (TRELEGY ELLIPTA) 100-62.5-25 MCG/INH AEPB, Inhale 1 applicator into the lungs daily. Rinse mouth after use. Stop symbicort and spiriva when taking this medicine., Disp: 1 each, Rfl: 10 .  HYDROcodone-acetaminophen (NORCO) 10-325 MG tablet, TAKE 1 TABLET BY MOUTH EVERY 6 HOURS AS NEEDED, Disp: 30 tablet, Rfl: 0 .  ipratropium (ATROVENT) 0.03 % nasal spray, , Disp: , Rfl: 1 .  lansoprazole (PREVACID) 30 MG capsule, TAKE 1 CAPSULE BY  MOUTH EVERY DAY, Disp: 30 capsule, Rfl: 12 .  LORazepam (ATIVAN) 1 MG tablet, 1/2 TO 1 TABLET BY MOUTH AT BEDTIME AS NEEDED FOR SLEEP, Disp: 30 tablet, Rfl: 5 .  Magnesium 400 MG CAPS, Take 1 capsule by mouth daily. , Disp: , Rfl:  .  metoprolol tartrate (LOPRESSOR) 50 MG tablet, TAKE ONE TABLET BY MOUTH TWICE DAILY, Disp: 180 tablet, Rfl: 4 .  montelukast (SINGULAIR) 10 MG tablet, TAKE ONE TABLET EVERY DAY, Disp: 30 tablet, Rfl: 12 .  MULTIPLE VITAMIN PO, Take 1 tablet by mouth daily. , Disp: , Rfl:  .  Omega-3 Fatty Acids (FISH OIL) 1000 MG CAPS, Take 1 capsule daily by mouth., Disp: , Rfl:  .  SPIRIVA HANDIHALER 18 MCG inhalation capsule, INHALE THE CONTENTS OF ONE CAPSULE AS DIRECTED ONCE DAILY, Disp: 30 capsule, Rfl: 12 .  SYMBICORT 160-4.5 MCG/ACT inhaler, TAKE 2 PUFFS INTO LUNGS TWICE A DAY, Disp: 10.2 g, Rfl: 12 .  tamsulosin (FLOMAX) 0.4 MG CAPS capsule, TAKE 1 CAPSULE BY MOUTH DAILY, Disp: 90 capsule, Rfl: 3  Past Medical History: Past Medical History:  Diagnosis Date  . Arthritis   . Back pain   . CAD (coronary artery disease)   . Cataract    right  . COPD (chronic obstructive pulmonary disease) ([place])    SPiriva and SYmbicort daily. Albuterol as needed  . Diverticulosis   . Dyspnea    with exertion  . Enlarged prostate    takes Flomax daily  .  GERD (gastroesophageal reflux disease)   . History of chicken pox   . History of gout   . History of measles   . History of mumps   . HOH (hard of hearing)   . Hyperglycemia   . Hyperlipidemia    takes Atorvastatin daily  . Hypertension    takes Metoprolol daily as well as Lotensin HCT  . Hypotension   . Joint pain   . Microscopic colitis   . PAF (paroxysmal atrial fibrillation) ([place]), RVR 03/14/2017  . Pneumonia   . Prostate cancer ([place])   . Stroke [facility])    TIA  . TIA (transient ischemic attack)   . Weakness    numbness and tingling.mainly on right    Tobacco Use: Social History   Tobacco Use  Smoking Status  Former Smoker  . Packs/day: 1.00  . Years: 11.00  . Pack years: 11.00  . Types: Cigarettes  . Last attempt to quit: 02/12/1984  . Years since quitting: 34.6  Smokeless Tobacco Never Used  Tobacco Comment   quit smoking 30 yrs ago    Labs: Recent Review Flowsheet Data    Labs for ITP Cardiac and Pulmonary Rehab Latest Ref Rng & Units 10/05/2014 10/07/2014 10/31/2017   Cholestrol 0 - 200 mg/dL - 104 106   LDLCALC 0 - 99 mg/dL - 40 45   HDL >40 mg/dL - 52 52   Trlycerides <150 mg/dL - 61 47   Hemoglobin A1c 4.8 - 5.6 % 5.7 5.7 6.1(H)       Pulmonary Assessment Scores: Pulmonary Assessment Scores    Row Name 09/24/18 1132         ADL UCSD   ADL Phase  Entry     SOB Score total  36     Rest  0     Walk  4     Stairs  4     Bath  1     Dress  1     Shop  1       CAT Score   CAT Score  11       mMRC Score   mMRC Score  2        Pulmonary Function Assessment: Pulmonary Function Assessment - 09/24/18 1202      Breath   Bilateral Breath Sounds  Clear    Shortness of Breath  Yes;Limiting activity       Exercise Target Goals: Exercise Program Goal: Individual exercise prescription set using results from initial 6 min walk test and THRR while considering  patient's activity barriers and safety.   Exercise Prescription Goal: Initial exercise prescription builds to 30-45 minutes a day of aerobic activity, 2-3 days per week.  Home exercise guidelines will be given to patient during program as part of exercise prescription that the participant will acknowledge.  Activity Barriers & Risk Stratification:   6 Minute Walk: 6 Minute Walk    Row Name 09/24/18 1222         6 Minute Walk   Phase  Initial     Distance  900 feet     Walk Time  6 minutes     # of Rest Breaks  0     MPH  1.7     METS  0.93     RPE  13     Perceived Dyspnea   1     VO2 Peak  3.29     Symptoms  No     Resting  HR  53 bpm     Resting BP  106/56     Resting Oxygen Saturation   95 %      Exercise Oxygen Saturation  during 6 min walk  91 %     Max Ex. HR  89 bpm     Max Ex. BP  136/64     2 Minute Post BP  110/56       Interval HR   1 Minute HR  75     2 Minute HR  76     3 Minute HR  80     4 Minute HR  83     5 Minute HR  89     6 Minute HR  74     2 Minute Post HR  77     Interval Heart Rate?  Yes       Interval Oxygen   Interval Oxygen?  Yes     Baseline Oxygen Saturation %  95 %     1 Minute Oxygen Saturation %  92 %     1 Minute Liters of Oxygen  0 L     2 Minute Oxygen Saturation %  91 %     2 Minute Liters of Oxygen  0 L     3 Minute Oxygen Saturation %  92 %     3 Minute Liters of Oxygen  0 L     4 Minute Oxygen Saturation %  91 %     4 Minute Liters of Oxygen  0 L     5 Minute Oxygen Saturation %  92 %     5 Minute Liters of Oxygen  0 L     6 Minute Oxygen Saturation %  92 %     6 Minute Liters of Oxygen  0 L     2 Minute Post Oxygen Saturation %  94 %     2 Minute Post Liters of Oxygen  0 L       Oxygen Initial Assessment: Oxygen Initial Assessment - 09/24/18 1202      Home Oxygen   Home Oxygen Device  None    Sleep Oxygen Prescription  None    Home Exercise Oxygen Prescription  None    Home at Rest Exercise Oxygen Prescription  None      Initial 6 min Walk   Oxygen Used  None      Program Oxygen Prescription   Program Oxygen Prescription  None      Intervention   Short Term Goals  To learn and demonstrate proper pursed lip breathing techniques or other breathing techniques.;To learn and understand importance of maintaining oxygen saturations>88%;To learn and understand importance of monitoring SPO2 with pulse oximeter and demonstrate accurate use of the pulse oximeter.;To learn and demonstrate proper use of respiratory medications    Long  Term Goals  Verbalizes importance of monitoring SPO2 with pulse oximeter and return demonstration;Maintenance of O2 saturations>88%;Exhibits proper breathing techniques, such as pursed lip  breathing or other method taught during program session;Compliance with respiratory medication;Demonstrates proper use of MDI's       Oxygen Re-Evaluation:   Oxygen Discharge (Final Oxygen Re-Evaluation):   Initial Exercise Prescription: Initial Exercise Prescription - 09/24/18 1200      Date of Initial Exercise RX and Referring Provider   Date  09/24/18    Referring Provider  Ashby Dawes      Treadmill   MPH  1    Grade  0    Minutes  15    METs  1.7      NuStep   Level  1    SPM  80    Minutes  15    METs  1.5      Biostep-RELP   Level  2    SPM  50    Minutes  15    METs  1.5      Prescription Details   Frequency (times per week)  3    Duration  Progress to 45 minutes of aerobic exercise without signs/symptoms of physical distress      Intensity   THRR 40-80% of Max Heartrate  85-117    Ratings of Perceived Exertion  11-15    Perceived Dyspnea  0-4      Resistance Training   Training Prescription  Yes    Weight  3 lb    Reps  10-15       Perform Capillary Blood Glucose checks as needed.  Exercise Prescription Changes:   Exercise Comments:   Exercise Goals and Review: Exercise Goals    Row Name 09/24/18 1221             Exercise Goals   Increase Physical Activity  Yes       Intervention  Provide advice, education, support and counseling about physical activity/exercise needs.;Develop an individualized exercise prescription for aerobic and resistive training based on initial evaluation findings, risk stratification, comorbidities and participant's personal goals.       Expected Outcomes  Short Term: Attend rehab on a regular basis to increase amount of physical activity.;Long Term: Add in home exercise to make exercise part of routine and to increase amount of physical activity.;Long Term: Exercising regularly at least 3-5 days a week.       Increase Strength and Stamina  Yes       Intervention  Provide advice, education, support and  counseling about physical activity/exercise needs.;Develop an individualized exercise prescription for aerobic and resistive training based on initial evaluation findings, risk stratification, comorbidities and participant's personal goals.       Expected Outcomes  Short Term: Increase workloads from initial exercise prescription for resistance, speed, and METs.;Short Term: Perform resistance training exercises routinely during rehab and add in resistance training at home;Long Term: Improve cardiorespiratory fitness, muscular endurance and strength as measured by increased METs and functional capacity (6MWT)       Able to understand and use rate of perceived exertion (RPE) scale  Yes       Intervention  Provide education and explanation on how to use RPE scale       Expected Outcomes  Short Term: Able to use RPE daily in rehab to express subjective intensity level;Long Term:  Able to use RPE to guide intensity level when exercising independently       Able to understand and use Dyspnea scale  Yes       Intervention  Provide education and explanation on how to use Dyspnea scale       Expected Outcomes  Short Term: Able to use Dyspnea scale daily in rehab to express subjective sense of shortness of breath during exertion;Long Term: Able to use Dyspnea scale to guide intensity level when exercising independently       Knowledge and understanding of Target Heart Rate Range (THRR)  Yes       Intervention  Provide education and explanation of THRR including how the numbers were predicted and where they  are located for reference       Expected Outcomes  Short Term: Able to state/look up THRR;Short Term: Able to use daily as guideline for intensity in rehab;Long Term: Able to use THRR to govern intensity when exercising independently       Able to check pulse independently  Yes       Intervention  Provide education and demonstration on how to check pulse in carotid and radial arteries.;Review the importance of  being able to check your own pulse for safety during independent exercise       Expected Outcomes  Long Term: Able to check pulse independently and accurately;Short Term: Able to explain why pulse checking is important during independent exercise       Understanding of Exercise Prescription  Yes       Intervention  Provide education, explanation, and written materials on patient's individual exercise prescription       Expected Outcomes  Short Term: Able to explain program exercise prescription;Long Term: Able to explain home exercise prescription to exercise independently          Exercise Goals Re-Evaluation :   Discharge Exercise Prescription (Final Exercise Prescription Changes):   Nutrition:  Target Goals: Understanding of nutrition guidelines, daily intake of sodium <1589m, cholesterol <2013m calories 30% from fat and 7% or less from saturated fats, daily to have 5 or more servings of fruits and vegetables.  Biometrics: Pre Biometrics - 09/24/18 1220      Pre Biometrics   Height  5' 4.5" (1.638 m)    Weight  186 lb 4.8 oz (84.5 kg)    Waist Circumference  42 inches    Hip Circumference  43 inches    Waist to Hip Ratio  0.98 %    BMI (Calculated)  31.5    Single Leg Stand  3.96 seconds        Nutrition Therapy Plan and Nutrition Goals: Nutrition Therapy & Goals - 09/24/18 1204      Personal Nutrition Goals   Nutrition Goal  Meet with the dietician      Intervention Plan   Intervention  Prescribe, educate and counsel regarding individualized specific dietary modifications aiming towards targeted core components such as weight, hypertension, lipid management, diabetes, heart failure and other comorbidities.    Expected Outcomes  Short Term Goal: Understand basic principles of dietary content, such as calories, fat, sodium, cholesterol and nutrients.;Long Term Goal: Adherence to prescribed nutrition plan.       Nutrition Assessments:   Nutrition Goals  Re-Evaluation:   Nutrition Goals Discharge (Final Nutrition Goals Re-Evaluation):   Psychosocial: Target Goals: Acknowledge presence or absence of significant depression and/or stress, maximize coping skills, provide positive support system. Participant is able to verbalize types and ability to use techniques and skills needed for reducing stress and depression.   Initial Review & Psychosocial Screening: Initial Psych Review & Screening - 09/24/18 1203      Initial Review   Current issues with  None Identified      Family Dynamics   Good Support System?  Yes    Comments  His wife of [phone] is a good support system      Barriers   Psychosocial barriers to participate in program  There are no identifiable barriers or psychosocial needs.;The patient should benefit from training in stress management and relaxation.      Screening Interventions   Interventions  Encouraged to exercise    Expected Outcomes  Short Term goal: Utilizing  psychosocial counselor, staff and physician to assist with identification of specific Stressors or current issues interfering with healing process. Setting desired goal for each stressor or current issue identified.;Long Term Goal: Stressors or current issues are controlled or eliminated.;Short Term goal: Identification and review with participant of any Quality of Life or Depression concerns found by scoring the questionnaire.;Long Term goal: The participant improves quality of Life and PHQ9 Scores as seen by post scores and/or verbalization of changes       Quality of Life Scores:  Scores of 19 and below usually indicate a poorer quality of life in these areas.  A difference of  2-3 points is a clinically meaningful difference.  A difference of 2-3 points in the total score of the Quality of Life Index has been associated with significant improvement in overall quality of life, self-image, physical symptoms, and general health in studies assessing change in  quality of life.  PHQ-9: Recent Review Flowsheet Data    Depression screen [facility] 2/9 09/24/2018 05/08/2018 02/13/2018 02/09/2017 10/24/2016   Decreased Interest 0 0 2 0 0   Down, Depressed, Hopeless 0 0 2 0 0   PHQ - 2 Score 0 0 4 0 0   Altered sleeping 0 - '2 2 1   ' Tired, decreased energy 1 - '2 1 2   ' Change in appetite 2 - 2 0 0   Feeling bad or failure about yourself  0 - 2 0 2    Trouble concentrating 0 - 0 0 1   Moving slowly or fidgety/restless 0 - 0 0 0   Suicidal thoughts 0 - 0 0 0   PHQ-9 Score 3 - '12 3 6   ' Difficult doing work/chores Not difficult at all - Very difficult Not difficult at all Somewhat difficult     Interpretation of Total Score  Total Score Depression Severity:  1-4 = Minimal depression, 5-9 = Mild depression, 10-14 = Moderate depression, 15-19 = Moderately severe depression, 20-27 = Severe depression   Psychosocial Evaluation and Intervention:   Psychosocial Re-Evaluation:   Psychosocial Discharge (Final Psychosocial Re-Evaluation):   Education: Education Goals: Education classes will be provided on a weekly basis, covering required topics. Participant will state understanding/return demonstration of topics presented.  Learning Barriers/Preferences: Learning Barriers/Preferences - 09/24/18 1121      Learning Barriers/Preferences   Learning Barriers  Hearing    Learning Preferences  None       Education Topics:  Initial Evaluation Education: - Verbal, written and demonstration of respiratory meds, oximetry and breathing techniques. Instruction on use of nebulizers and MDIs and importance of monitoring MDI activations.   Pulmonary Rehab from 09/24/2018 in [facility] Cardiac and Pulmonary Rehab  Date  09/24/18  Educator  [facility]  Instruction Review Code  1- Verbalizes Understanding      General Nutrition Guidelines/Fats and Fiber: -Group instruction provided by verbal, written material, models and posters to present the general guidelines for heart healthy  nutrition. Gives an explanation and review of dietary fats and fiber.   Pulmonary Rehab from 02/21/2017 in [facility] Cardiac and Pulmonary Rehab  Date  02/19/17  Educator  CR  Instruction Review Code (retired)  2- meets goals/outcomes      Controlling Sodium/Reading Food Labels: -Group verbal and written material supporting the discussion of sodium use in heart healthy nutrition. Review and explanation with models, verbal and written materials for utilization of the food label.   Pulmonary Rehab from 02/21/2017 in [facility] Cardiac and Pulmonary Rehab  Date  01/08/17  Educator  CR  Instruction Review Code (retired)  2- meets goals/outcomes      Exercise Physiology & General Exercise Guidelines: - Group verbal and written instruction with models to review the exercise physiology of the cardiovascular system and associated critical values. Provides general exercise guidelines with specific guidelines to those with heart or lung disease.    Pulmonary Rehab from 02/21/2017 in [facility] Cardiac and Pulmonary Rehab  Date  12/20/16  Educator  [facility]  Instruction Review Code (retired)  2- meets goals/outcomes      Aerobic Exercise & Resistance Training: - Gives group verbal and written instruction on the various components of exercise. Focuses on aerobic and resistive training programs and the benefits of this training and how to safely progress through these programs.   Pulmonary Rehab from 02/21/2017 in [facility] Cardiac and Pulmonary Rehab  Date  01/17/17  Educator  [facility]  Instruction Review Code (retired)  2- Statistician, Balance, Mind/Body Relaxation: Provides group verbal/written instruction on the benefits of flexibility and balance training, including mind/body exercise modes such as yoga, pilates and tai chi.  Demonstration and skill practice provided.   Pulmonary Rehab from 02/21/2017 in [facility] Cardiac and Pulmonary Rehab  Date  02/09/17  Educator  AS  Instruction Review Code (retired)   2- meets goals/outcomes      Stress and Anxiety: - Provides group verbal and written instruction about the health risks of elevated stress and causes of high stress.  Discuss the correlation between heart/lung disease and anxiety and treatment options. Review healthy ways to manage with stress and anxiety.   Pulmonary Rehab from 02/21/2017 in [facility] Cardiac and Pulmonary Rehab  Date  12/27/16  Educator  [facility]  Instruction Review Code (retired)  2- meets goals/outcomes      Depression: - Provides group verbal and written instruction on the correlation between heart/lung disease and depressed mood, treatment options, and the stigmas associated with seeking treatment.   Pulmonary Rehab from 02/21/2017 in [facility] Cardiac and Pulmonary Rehab  Date  02/21/17  Educator  [facility]  Instruction Review Code (retired)  2- meets goals/outcomes      Exercise & Equipment Safety: - Individual verbal instruction and demonstration of equipment use and safety with use of the equipment.   Pulmonary Rehab from 09/24/2018 in [facility] Cardiac and Pulmonary Rehab  Date  09/24/18  Educator  [facility]  Instruction Review Code  1- Verbalizes Understanding      Infection Prevention: - Provides verbal and written material to individual with discussion of infection control including proper hand washing and proper equipment cleaning during exercise session.   Pulmonary Rehab from 09/24/2018 in [facility] Cardiac and Pulmonary Rehab  Date  09/24/18  Educator  [facility]  Instruction Review Code  1- Verbalizes Understanding      Falls Prevention: - Provides verbal and written material to individual with discussion of falls prevention and safety.   Pulmonary Rehab from 09/24/2018 in [facility] Cardiac and Pulmonary Rehab  Date  09/24/18  Educator  [facility]  Instruction Review Code  1- Verbalizes Understanding      Diabetes: - Individual verbal and written instruction to review signs/symptoms of diabetes, desired ranges of glucose level fasting, after meals  and with exercise. Advice that pre and post exercise glucose checks will be done for 3 sessions at entry of program.   Chronic Lung Diseases: - Group verbal and written instruction to review updates, respiratory medications, advancements in procedures and treatments. Discuss use  of supplemental oxygen including available portable oxygen systems, continuous and intermittent flow rates, concentrators, personal use and safety guidelines. Review proper use of inhaler and spacers. Provide informative websites for self-education.    Pulmonary Rehab from 02/21/2017 in [facility] Cardiac and Pulmonary Rehab  Date  01/10/17  Educator  LB  Instruction Review Code (retired)  2- meets Chief Financial Officer: - Provide group verbal and written instruction for methods to conserve energy, plan and organize activities. Instruct on pacing techniques, use of adaptive equipment and posture/positioning to relieve shortness of breath.   Triggers and Exacerbations: - Group verbal and written instruction to review types of environmental triggers and ways to prevent exacerbations. Discuss weather changes, air quality and the benefits of nasal washing. Review warning signs and symptoms to help prevent infections. Discuss techniques for effective airway clearance, coughing, and vibrations.   Pulmonary Rehab from 02/21/2017 in [facility] Cardiac and Pulmonary Rehab  Date  02/07/17  Educator  LB  Instruction Review Code (retired)  2- meets goals/outcomes      AED/CPR: - Group verbal and written instruction with the use of models to demonstrate the basic use of the AED with the basic ABC's of resuscitation.   Anatomy and Physiology of the Lungs: - Group verbal and written instruction with the use of models to provide basic lung anatomy and physiology related to function, structure and complications of lung disease.   Anatomy & Physiology of the Heart: - Group verbal and written instruction and models provide  basic cardiac anatomy and physiology, with the coronary electrical and arterial systems. Review of Valvular disease and Heart Failure   Pulmonary Rehab from 02/21/2017 in [facility] Cardiac and Pulmonary Rehab  Date  02/02/17  Educator  CE  Instruction Review Code (retired)  2- meets goals/outcomes      Cardiac Medications: - Group verbal and written instruction to review commonly prescribed medications for heart disease. Reviews the medication, class of the drug, and side effects.   Know Your Numbers and Risk Factors: -Group verbal and written instruction about important numbers in your health.  Discussion of what are risk factors and how they play a role in the disease process.  Review of Cholesterol, Blood Pressure, Diabetes, and BMI and the role they play in your overall health.   Sleep Hygiene: -Provides group verbal and written instruction about how sleep can affect your health.  Define sleep hygiene, discuss sleep cycles and impact of sleep habits. Review good sleep hygiene tips.    Other: -Provides group and verbal instruction on various topics (see comments)    Knowledge Questionnaire Score: Knowledge Questionnaire Score - 09/24/18 1135      Knowledge Questionnaire Score   Pre Score  12/18   REVIEWED with patient       Core Components/Risk Factors/Patient Goals at Admission: Personal Goals and Risk Factors at Admission - 09/24/18 1205      Core Components/Risk Factors/Patient Goals on Admission    Weight Management  Yes;Weight Loss    Intervention  Weight Management: Develop a combined nutrition and exercise program designed to reach desired caloric intake, while maintaining appropriate intake of nutrient and fiber, sodium and fats, and appropriate energy expenditure required for the weight goal.;Weight Management: Provide education and appropriate resources to help participant work on and attain dietary goals.;Weight Management/Obesity: Establish reasonable short term and  long term weight goals.    Admit Weight  186 lb 4.8 oz (84.5 kg)    Goal Weight: Short  Term  181 lb (82.1 kg)    Goal Weight: Long Term  175 lb (79.4 kg)    Expected Outcomes  Short Term: Continue to assess and modify interventions until short term weight is achieved;Long Term: Adherence to nutrition and physical activity/exercise program aimed toward attainment of established weight goal;Weight Loss: Understanding of general recommendations for a balanced deficit meal plan, which promotes 1-2 lb weight loss per week and includes a negative energy balance of [phone] kcal/d;Understanding recommendations for meals to include 15-35% energy as protein, 25-35% energy from fat, 35-60% energy from carbohydrates, less than 281m of dietary cholesterol, 20-35 gm of total fiber daily;Understanding of distribution of calorie intake throughout the day with the consumption of 4-5 meals/snacks    Improve shortness of breath with ADL's  Yes    Intervention  Provide education, individualized exercise plan and daily activity instruction to help decrease symptoms of SOB with activities of daily living.    Expected Outcomes  Short Term: Improve cardiorespiratory fitness to achieve a reduction of symptoms when performing ADLs;Long Term: Be able to perform more ADLs without symptoms or delay the onset of symptoms    Hypertension  Yes    Intervention  Provide education on lifestyle modifcations including regular physical activity/exercise, weight management, moderate sodium restriction and increased consumption of fresh fruit, vegetables, and low fat dairy, alcohol moderation, and smoking cessation.;Monitor prescription use compliance.    Expected Outcomes  Short Term: Continued assessment and intervention until BP is < 140/958mHG in hypertensive participants. < 130/8017mG in hypertensive participants with diabetes, heart failure or chronic kidney disease.;Long Term: Maintenance of blood pressure at goal levels.    Lipids   Yes    Intervention  Provide education and support for participant on nutrition & aerobic/resistive exercise along with prescribed medications to achieve LDL <34m102mDL >40mg68m Expected Outcomes  Short Term: Participant states understanding of desired cholesterol values and is compliant with medications prescribed. Participant is following exercise prescription and nutrition guidelines.;Long Term: Cholesterol controlled with medications as prescribed, with individualized exercise RX and with personalized nutrition plan. Value goals: LDL < 34mg,38m > 40 mg.       Core Components/Risk Factors/Patient Goals Review:    Core Components/Risk Factors/Patient Goals at Discharge (Final Review):    ITP Comments: ITP Comments    Row Name 09/24/18 1120           ITP Comments  Medical Evaluation completed. Chart sent for review and changes to Dr. Mark MEmily Filberttor of [facility].          Comments: Initial ITP

## 2018-09-24 NOTE — Progress Notes (Signed)
Daily Session Note  Patient Details  Name: James [name] MRN: [id number] Date of Birth: May 29, 1929 Referring Provider:     Pulmonary Rehab from 09/24/2018 in [facility] Cardiac and Pulmonary Rehab  Referring Provider  Ashby Dawes      Encounter Date: 09/24/2018  Check In: Session Check In - 09/24/18 1119      Check-In   Supervising physician immediately available to respond to emergencies  [facility] immediately available ER MD    Physician(s)  Dr. Jimmye Norman and Corky Downs    Location  [facility]-Cardiac & Pulmonary Rehab    Staff Present  Justin Mend RCP,RRT,BSRT;Amanda Oletta Darter, [place], ACSM CEP, Exercise Physiologist    Medication changes reported      No    Fall or balance concerns reported     No    Warm-up and Cool-down  Not performed (comment)   medical evaluation   Resistance Training Performed  No    VAD Patient?  No    PAD/SET Patient?  No      Pain Assessment   Currently in Pain?  No/denies          Social History   Tobacco Use  Smoking Status Former Smoker  . Packs/day: 1.00  . Years: 11.00  . Pack years: 11.00  . Types: Cigarettes  . Last attempt to quit: 02/12/1984  . Years since quitting: 34.6  Smokeless Tobacco Never Used  Tobacco Comment   quit smoking 30 yrs ago    Goals Met:  Exercise tolerated well Personal goals reviewed Queuing for purse lip breathing No report of cardiac concerns or symptoms Strength training completed today  Goals Unmet:  Not Applicable  Comments:  6 Minute Walk    Row Name 09/24/18 1222         6 Minute Walk   Phase  Initial     Distance  900 feet     Walk Time  6 minutes     # of Rest Breaks  0     MPH  1.7     METS  0.93     RPE  13     Perceived Dyspnea   1     VO2 Peak  3.29     Symptoms  No     Resting HR  53 bpm     Resting BP  106/56     Resting Oxygen Saturation   95 %     Exercise Oxygen Saturation  during 6 min walk  91 %     Max Ex. HR  89 bpm     Max Ex. BP  136/64     2 Minute Post BP  110/56       Interval HR   1 Minute HR  75     2 Minute HR  76     3 Minute HR  80     4 Minute HR  83     5 Minute HR  89     6 Minute HR  74     2 Minute Post HR  77     Interval Heart Rate?  Yes       Interval Oxygen   Interval Oxygen?  Yes     Baseline Oxygen Saturation %  95 %     1 Minute Oxygen Saturation %  92 %     1 Minute Liters of Oxygen  0 L     2 Minute Oxygen Saturation %  91 %  2 Minute Liters of Oxygen  0 L     3 Minute Oxygen Saturation %  92 %     3 Minute Liters of Oxygen  0 L     4 Minute Oxygen Saturation %  91 %     4 Minute Liters of Oxygen  0 L     5 Minute Oxygen Saturation %  92 %     5 Minute Liters of Oxygen  0 L     6 Minute Oxygen Saturation %  92 %     6 Minute Liters of Oxygen  0 L     2 Minute Post Oxygen Saturation %  94 %     2 Minute Post Liters of Oxygen  0 L       Service time- [id number]. Pt able to follow exercise prescription today without complaint.  Will continue to monitor for progression.    Dr. Emily Filbert is Medical Director for [place] and [facility].

## 2018-09-24 NOTE — Patient Instructions (Signed)
Patient Instructions  Patient Details  Name: James [name] MRN: [id number] Date of Birth: December 16, 1929 Referring Provider:  Laverle Hobby, *  Below are your personal goals for exercise, nutrition, and risk factors. Our goal is to help you stay on track towards obtaining and maintaining these goals. We will be discussing your progress on these goals with you throughout the program.  Initial Exercise Prescription: Initial Exercise Prescription - 09/24/18 1200      Date of Initial Exercise RX and Referring Provider   Date  09/24/18    Referring Provider  Ashby Dawes      Treadmill   MPH  1    Grade  0    Minutes  15    METs  1.7      NuStep   Level  1    SPM  80    Minutes  15    METs  1.5      Biostep-RELP   Level  2    SPM  50    Minutes  15    METs  1.5      Prescription Details   Frequency (times per week)  3    Duration  Progress to 45 minutes of aerobic exercise without signs/symptoms of physical distress      Intensity   THRR 40-80% of Max Heartrate  85-117    Ratings of Perceived Exertion  11-15    Perceived Dyspnea  0-4      Resistance Training   Training Prescription  Yes    Weight  3 lb    Reps  10-15       Exercise Goals: Frequency: Be able to perform aerobic exercise two to three times per week in program working toward 2-5 days per week of home exercise.  Intensity: Work with a perceived exertion of 11 (fairly light) - 15 (hard) while following your exercise prescription.  We will make changes to your prescription with you as you progress through the program.   Duration: Be able to do 30 to 45 minutes of continuous aerobic exercise in addition to a 5 minute warm-up and a 5 minute cool-down routine.   Nutrition Goals: Your personal nutrition goals will be established when you do your nutrition analysis with the dietician.  The following are general nutrition guidelines to follow: Cholesterol < 200mg /day Sodium < 1500mg /day Fiber: Men over  50 yrs - 30 grams per day  Personal Goals: Personal Goals and Risk Factors at Admission - 09/24/18 1205      Core Components/Risk Factors/Patient Goals on Admission    Weight Management  Yes;Weight Loss    Intervention  Weight Management: Develop a combined nutrition and exercise program designed to reach desired caloric intake, while maintaining appropriate intake of nutrient and fiber, sodium and fats, and appropriate energy expenditure required for the weight goal.;Weight Management: Provide education and appropriate resources to help participant work on and attain dietary goals.;Weight Management/Obesity: Establish reasonable short term and long term weight goals.    Admit Weight  186 lb 4.8 oz (84.5 kg)    Goal Weight: Short Term  181 lb (82.1 kg)    Goal Weight: Long Term  175 lb (79.4 kg)    Expected Outcomes  Short Term: Continue to assess and modify interventions until short term weight is achieved;Long Term: Adherence to nutrition and physical activity/exercise program aimed toward attainment of established weight goal;Weight Loss: Understanding of general recommendations for a balanced deficit meal plan, which promotes 1-2 lb weight loss  per week and includes a negative energy balance of [phone] kcal/d;Understanding recommendations for meals to include 15-35% energy as protein, 25-35% energy from fat, 35-60% energy from carbohydrates, less than 200mg  of dietary cholesterol, 20-35 gm of total fiber daily;Understanding of distribution of calorie intake throughout the day with the consumption of 4-5 meals/snacks    Improve shortness of breath with ADL's  Yes    Intervention  Provide education, individualized exercise plan and daily activity instruction to help decrease symptoms of SOB with activities of daily living.    Expected Outcomes  Short Term: Improve cardiorespiratory fitness to achieve a reduction of symptoms when performing ADLs;Long Term: Be able to perform more ADLs without  symptoms or delay the onset of symptoms    Hypertension  Yes    Intervention  Provide education on lifestyle modifcations including regular physical activity/exercise, weight management, moderate sodium restriction and increased consumption of fresh fruit, vegetables, and low fat dairy, alcohol moderation, and smoking cessation.;Monitor prescription use compliance.    Expected Outcomes  Short Term: Continued assessment and intervention until BP is < 140/73mm HG in hypertensive participants. < 130/65mm HG in hypertensive participants with diabetes, heart failure or chronic kidney disease.;Long Term: Maintenance of blood pressure at goal levels.    Lipids  Yes    Intervention  Provide education and support for participant on nutrition & aerobic/resistive exercise along with prescribed medications to achieve LDL 70mg , HDL >40mg .    Expected Outcomes  Short Term: Participant states understanding of desired cholesterol values and is compliant with medications prescribed. Participant is following exercise prescription and nutrition guidelines.;Long Term: Cholesterol controlled with medications as prescribed, with individualized exercise RX and with personalized nutrition plan. Value goals: LDL < 70mg , HDL > 40 mg.       Tobacco Use Initial Evaluation: Social History   Tobacco Use  Smoking Status Former Smoker  . Packs/day: 1.00  . Years: 11.00  . Pack years: 11.00  . Types: Cigarettes  . Last attempt to quit: 02/12/1984  . Years since quitting: 34.6  Smokeless Tobacco Never Used  Tobacco Comment   quit smoking 30 yrs ago    Exercise Goals and Review: Exercise Goals    Row Name 09/24/18 1221             Exercise Goals   Increase Physical Activity  Yes       Intervention  Provide advice, education, support and counseling about physical activity/exercise needs.;Develop an individualized exercise prescription for aerobic and resistive training based on initial evaluation findings, risk  stratification, comorbidities and participant's personal goals.       Expected Outcomes  Short Term: Attend rehab on a regular basis to increase amount of physical activity.;Long Term: Add in home exercise to make exercise part of routine and to increase amount of physical activity.;Long Term: Exercising regularly at least 3-5 days a week.       Increase Strength and Stamina  Yes       Intervention  Provide advice, education, support and counseling about physical activity/exercise needs.;Develop an individualized exercise prescription for aerobic and resistive training based on initial evaluation findings, risk stratification, comorbidities and participant's personal goals.       Expected Outcomes  Short Term: Increase workloads from initial exercise prescription for resistance, speed, and METs.;Short Term: Perform resistance training exercises routinely during rehab and add in resistance training at home;Long Term: Improve cardiorespiratory fitness, muscular endurance and strength as measured by increased METs and functional capacity (6MWT)  Able to understand and use rate of perceived exertion (RPE) scale  Yes       Intervention  Provide education and explanation on how to use RPE scale       Expected Outcomes  Short Term: Able to use RPE daily in rehab to express subjective intensity level;Long Term:  Able to use RPE to guide intensity level when exercising independently       Able to understand and use Dyspnea scale  Yes       Intervention  Provide education and explanation on how to use Dyspnea scale       Expected Outcomes  Short Term: Able to use Dyspnea scale daily in rehab to express subjective sense of shortness of breath during exertion;Long Term: Able to use Dyspnea scale to guide intensity level when exercising independently       Knowledge and understanding of Target Heart Rate Range (THRR)  Yes       Intervention  Provide education and explanation of THRR including how the numbers  were predicted and where they are located for reference       Expected Outcomes  Short Term: Able to state/look up THRR;Short Term: Able to use daily as guideline for intensity in rehab;Long Term: Able to use THRR to govern intensity when exercising independently       Able to check pulse independently  Yes       Intervention  Provide education and demonstration on how to check pulse in carotid and radial arteries.;Review the importance of being able to check your own pulse for safety during independent exercise       Expected Outcomes  Long Term: Able to check pulse independently and accurately;Short Term: Able to explain why pulse checking is important during independent exercise       Understanding of Exercise Prescription  Yes       Intervention  Provide education, explanation, and written materials on patient's individual exercise prescription       Expected Outcomes  Short Term: Able to explain program exercise prescription;Long Term: Able to explain home exercise prescription to exercise independently          Copy of goals given to participant.

## 2018-09-30 ENCOUNTER — Encounter: Payer: Medicare Other | Admitting: *Deleted

## 2018-09-30 DIAGNOSIS — R0600 Dyspnea, unspecified: Secondary | ICD-10-CM

## 2018-09-30 DIAGNOSIS — H9190 Unspecified hearing loss, unspecified ear: Secondary | ICD-10-CM | POA: Diagnosis not present

## 2018-09-30 DIAGNOSIS — R0609 Other forms of dyspnea: Secondary | ICD-10-CM | POA: Diagnosis not present

## 2018-09-30 DIAGNOSIS — Z8546 Personal history of malignant neoplasm of prostate: Secondary | ICD-10-CM | POA: Diagnosis not present

## 2018-09-30 DIAGNOSIS — I2510 Atherosclerotic heart disease of native coronary artery without angina pectoris: Secondary | ICD-10-CM | POA: Diagnosis not present

## 2018-09-30 DIAGNOSIS — Z87891 Personal history of nicotine dependence: Secondary | ICD-10-CM | POA: Diagnosis not present

## 2018-09-30 DIAGNOSIS — Z79899 Other long term (current) drug therapy: Secondary | ICD-10-CM | POA: Diagnosis not present

## 2018-09-30 DIAGNOSIS — E785 Hyperlipidemia, unspecified: Secondary | ICD-10-CM | POA: Diagnosis not present

## 2018-09-30 DIAGNOSIS — M1990 Unspecified osteoarthritis, unspecified site: Secondary | ICD-10-CM | POA: Diagnosis not present

## 2018-09-30 DIAGNOSIS — Z7951 Long term (current) use of inhaled steroids: Secondary | ICD-10-CM | POA: Diagnosis not present

## 2018-09-30 DIAGNOSIS — K219 Gastro-esophageal reflux disease without esophagitis: Secondary | ICD-10-CM | POA: Diagnosis not present

## 2018-09-30 DIAGNOSIS — N400 Enlarged prostate without lower urinary tract symptoms: Secondary | ICD-10-CM | POA: Diagnosis not present

## 2018-09-30 DIAGNOSIS — J449 Chronic obstructive pulmonary disease, unspecified: Secondary | ICD-10-CM

## 2018-09-30 DIAGNOSIS — I480 Paroxysmal atrial fibrillation: Secondary | ICD-10-CM | POA: Diagnosis not present

## 2018-09-30 DIAGNOSIS — Z7901 Long term (current) use of anticoagulants: Secondary | ICD-10-CM | POA: Diagnosis not present

## 2018-09-30 DIAGNOSIS — Z8673 Personal history of transient ischemic attack (TIA), and cerebral infarction without residual deficits: Secondary | ICD-10-CM | POA: Diagnosis not present

## 2018-09-30 DIAGNOSIS — I10 Essential (primary) hypertension: Secondary | ICD-10-CM | POA: Diagnosis not present

## 2018-09-30 NOTE — Progress Notes (Signed)
Daily Session Note  Patient Details  Name: James [name] MRN: [id number] Date of Birth: 1929-06-14 Referring Provider:     Pulmonary Rehab from 09/24/2018 in [facility] Cardiac and Pulmonary Rehab  Referring Provider  Ashby Dawes      Encounter Date: 09/30/2018  Check In: Session Check In - 09/30/18 1012      Check-In   Supervising physician immediately available to respond to emergencies  [facility] immediately available ER MD    Physician(s)  Dr. Charlynn Court and Dr. Kerman Passey    Location  [facility]-Cardiac & Pulmonary Rehab    Staff Present  Justin Mend Jaci Carrel, BS, ACSM CEP, Exercise Physiologist;Amanda Oletta Darter, [place], ACSM CEP, Exercise Physiologist    Medication changes reported      No    Fall or balance concerns reported     No    Tobacco Cessation  No Change    Warm-up and Cool-down  Performed as group-led instruction    Resistance Training Performed  Yes    VAD Patient?  No    PAD/SET Patient?  No      Pain Assessment   Currently in Pain?  No/denies    Multiple Pain Sites  No          Social History   Tobacco Use  Smoking Status Former Smoker  . Packs/day: 1.00  . Years: 11.00  . Pack years: 11.00  . Types: Cigarettes  . Last attempt to quit: 02/12/1984  . Years since quitting: 34.6  Smokeless Tobacco Never Used  Tobacco Comment   quit smoking 30 yrs ago    Goals Met:  Proper associated with RPD/PD & O2 Sat Exercise tolerated well Personal goals reviewed No report of cardiac concerns or symptoms Strength training completed today  Goals Unmet:  Not Applicable  Comments: First full day of exercise!  Patient was oriented to gym and equipment including functions, settings, policies, and procedures.  Patient's individual exercise prescription and treatment plan were reviewed.  All starting workloads were established based on the results of the 6 minute walk test done at initial orientation visit.  The plan for exercise progression was also introduced and  progression will be customized based on patient's performance and goals.    Dr. Emily Filbert is Medical Director for [place] and [facility].

## 2018-10-02 DIAGNOSIS — J449 Chronic obstructive pulmonary disease, unspecified: Secondary | ICD-10-CM | POA: Diagnosis not present

## 2018-10-02 DIAGNOSIS — I2510 Atherosclerotic heart disease of native coronary artery without angina pectoris: Secondary | ICD-10-CM | POA: Diagnosis not present

## 2018-10-02 DIAGNOSIS — Z7951 Long term (current) use of inhaled steroids: Secondary | ICD-10-CM | POA: Diagnosis not present

## 2018-10-02 DIAGNOSIS — Z87891 Personal history of nicotine dependence: Secondary | ICD-10-CM | POA: Diagnosis not present

## 2018-10-02 DIAGNOSIS — I10 Essential (primary) hypertension: Secondary | ICD-10-CM | POA: Diagnosis not present

## 2018-10-02 DIAGNOSIS — H9190 Unspecified hearing loss, unspecified ear: Secondary | ICD-10-CM | POA: Diagnosis not present

## 2018-10-02 DIAGNOSIS — Z79899 Other long term (current) drug therapy: Secondary | ICD-10-CM | POA: Diagnosis not present

## 2018-10-02 DIAGNOSIS — R0600 Dyspnea, unspecified: Secondary | ICD-10-CM

## 2018-10-02 DIAGNOSIS — N400 Enlarged prostate without lower urinary tract symptoms: Secondary | ICD-10-CM | POA: Diagnosis not present

## 2018-10-02 DIAGNOSIS — M1990 Unspecified osteoarthritis, unspecified site: Secondary | ICD-10-CM | POA: Diagnosis not present

## 2018-10-02 DIAGNOSIS — R0609 Other forms of dyspnea: Secondary | ICD-10-CM | POA: Diagnosis not present

## 2018-10-02 DIAGNOSIS — Z7901 Long term (current) use of anticoagulants: Secondary | ICD-10-CM | POA: Diagnosis not present

## 2018-10-02 DIAGNOSIS — Z8546 Personal history of malignant neoplasm of prostate: Secondary | ICD-10-CM | POA: Diagnosis not present

## 2018-10-02 DIAGNOSIS — I480 Paroxysmal atrial fibrillation: Secondary | ICD-10-CM | POA: Diagnosis not present

## 2018-10-02 DIAGNOSIS — K219 Gastro-esophageal reflux disease without esophagitis: Secondary | ICD-10-CM | POA: Diagnosis not present

## 2018-10-02 DIAGNOSIS — E785 Hyperlipidemia, unspecified: Secondary | ICD-10-CM | POA: Diagnosis not present

## 2018-10-02 DIAGNOSIS — Z8673 Personal history of transient ischemic attack (TIA), and cerebral infarction without residual deficits: Secondary | ICD-10-CM | POA: Diagnosis not present

## 2018-10-02 NOTE — Progress Notes (Signed)
Daily Session Note  Patient Details  Name: James [name] MRN: [id number] Date of Birth: 1929/12/09 Referring Provider:     Pulmonary Rehab from 09/24/2018 in [facility] Cardiac and Pulmonary Rehab  Referring Provider  Ashby Dawes      Encounter Date: 10/02/2018  Check In: Session Check In - 10/02/18 Mobile      Check-In   Supervising physician immediately available to respond to emergencies  [facility] immediately available ER MD    Physician(s)  Burlene Arnt and Jimmye Norman    Location  [facility]-Cardiac & Pulmonary Rehab    Staff Present  Alberteen Sam, MA, RCEP, CCRP, Exercise Physiologist;Joseph Foy [place], [place], ACSM CEP, Exercise Physiologist    Medication changes reported      No    Fall or balance concerns reported     No    Warm-up and Cool-down  Performed as group-led instruction    Resistance Training Performed  Yes    VAD Patient?  No    PAD/SET Patient?  No      Pain Assessment   Currently in Pain?  No/denies    Multiple Pain Sites  No          Social History   Tobacco Use  Smoking Status Former Smoker  . Packs/day: 1.00  . Years: 11.00  . Pack years: 11.00  . Types: Cigarettes  . Last attempt to quit: 02/12/1984  . Years since quitting: 34.6  Smokeless Tobacco Never Used  Tobacco Comment   quit smoking 30 yrs ago    Goals Met:  Proper associated with RPD/PD & O2 Sat Independence with exercise equipment Exercise tolerated well Strength training completed today  Goals Unmet:  Not Applicable  Comments: Pt able to follow exercise prescription today without complaint.  Will continue to monitor for progression.    Dr. Emily Filbert is Medical Director for [place] and [facility].

## 2018-10-04 DIAGNOSIS — Z79899 Other long term (current) drug therapy: Secondary | ICD-10-CM | POA: Diagnosis not present

## 2018-10-04 DIAGNOSIS — N400 Enlarged prostate without lower urinary tract symptoms: Secondary | ICD-10-CM | POA: Diagnosis not present

## 2018-10-04 DIAGNOSIS — I480 Paroxysmal atrial fibrillation: Secondary | ICD-10-CM | POA: Diagnosis not present

## 2018-10-04 DIAGNOSIS — Z7951 Long term (current) use of inhaled steroids: Secondary | ICD-10-CM | POA: Diagnosis not present

## 2018-10-04 DIAGNOSIS — R0609 Other forms of dyspnea: Secondary | ICD-10-CM | POA: Diagnosis not present

## 2018-10-04 DIAGNOSIS — Z87891 Personal history of nicotine dependence: Secondary | ICD-10-CM | POA: Diagnosis not present

## 2018-10-04 DIAGNOSIS — Z8546 Personal history of malignant neoplasm of prostate: Secondary | ICD-10-CM | POA: Diagnosis not present

## 2018-10-04 DIAGNOSIS — E785 Hyperlipidemia, unspecified: Secondary | ICD-10-CM | POA: Diagnosis not present

## 2018-10-04 DIAGNOSIS — R0600 Dyspnea, unspecified: Secondary | ICD-10-CM

## 2018-10-04 DIAGNOSIS — M1990 Unspecified osteoarthritis, unspecified site: Secondary | ICD-10-CM | POA: Diagnosis not present

## 2018-10-04 DIAGNOSIS — Z7901 Long term (current) use of anticoagulants: Secondary | ICD-10-CM | POA: Diagnosis not present

## 2018-10-04 DIAGNOSIS — I10 Essential (primary) hypertension: Secondary | ICD-10-CM | POA: Diagnosis not present

## 2018-10-04 DIAGNOSIS — I2510 Atherosclerotic heart disease of native coronary artery without angina pectoris: Secondary | ICD-10-CM | POA: Diagnosis not present

## 2018-10-04 DIAGNOSIS — K219 Gastro-esophageal reflux disease without esophagitis: Secondary | ICD-10-CM | POA: Diagnosis not present

## 2018-10-04 DIAGNOSIS — Z8673 Personal history of transient ischemic attack (TIA), and cerebral infarction without residual deficits: Secondary | ICD-10-CM | POA: Diagnosis not present

## 2018-10-04 DIAGNOSIS — J449 Chronic obstructive pulmonary disease, unspecified: Secondary | ICD-10-CM | POA: Diagnosis not present

## 2018-10-04 DIAGNOSIS — H9190 Unspecified hearing loss, unspecified ear: Secondary | ICD-10-CM | POA: Diagnosis not present

## 2018-10-04 NOTE — Progress Notes (Signed)
Daily Session Note  Patient Details  Name: James [name] MRN: [id number] Date of Birth: November 19, 1929 Referring Provider:     Pulmonary Rehab from 09/24/2018 in [facility] Cardiac and Pulmonary Rehab  Referring Provider  Ramachandran      Encounter Date: 10/04/2018  Check In: Session Check In - 10/04/18 6484      Check-In   Supervising physician immediately available to respond to emergencies  [facility] immediately available ER MD    Physician(s)  Dr. Quentin Cornwall and [facility]    Location  [facility]-Cardiac & Pulmonary Rehab    Staff Present  Justin Mend RCP,RRT,BSRT;Krista Frederico Hamman, RN BSN;Meredith Sherryll Burger, RN BSN    Medication changes reported      No    Fall or balance concerns reported     No    Warm-up and Cool-down  Performed as group-led Higher education careers adviser Performed  Yes    VAD Patient?  No    PAD/SET Patient?  No      Pain Assessment   Currently in Pain?  No/denies          Social History   Tobacco Use  Smoking Status Former Smoker  . Packs/day: 1.00  . Years: 11.00  . Pack years: 11.00  . Types: Cigarettes  . Last attempt to quit: 02/12/1984  . Years since quitting: 34.6  Smokeless Tobacco Never Used  Tobacco Comment   quit smoking 30 yrs ago    Goals Met:  Independence with exercise equipment Exercise tolerated well No report of cardiac concerns or symptoms Strength training completed today  Goals Unmet:  Not Applicable  Comments: Pt able to follow exercise prescription today without complaint.  Will continue to monitor for progression.    Dr. Emily Filbert is Medical Director for [place] and [facility].

## 2018-10-07 ENCOUNTER — Encounter: Payer: Medicare Other | Admitting: *Deleted

## 2018-10-07 DIAGNOSIS — N400 Enlarged prostate without lower urinary tract symptoms: Secondary | ICD-10-CM | POA: Diagnosis not present

## 2018-10-07 DIAGNOSIS — R0609 Other forms of dyspnea: Secondary | ICD-10-CM | POA: Diagnosis not present

## 2018-10-07 DIAGNOSIS — J449 Chronic obstructive pulmonary disease, unspecified: Secondary | ICD-10-CM

## 2018-10-07 DIAGNOSIS — Z87891 Personal history of nicotine dependence: Secondary | ICD-10-CM | POA: Diagnosis not present

## 2018-10-07 DIAGNOSIS — Z8673 Personal history of transient ischemic attack (TIA), and cerebral infarction without residual deficits: Secondary | ICD-10-CM | POA: Diagnosis not present

## 2018-10-07 DIAGNOSIS — K219 Gastro-esophageal reflux disease without esophagitis: Secondary | ICD-10-CM | POA: Diagnosis not present

## 2018-10-07 DIAGNOSIS — I480 Paroxysmal atrial fibrillation: Secondary | ICD-10-CM | POA: Diagnosis not present

## 2018-10-07 DIAGNOSIS — Z7901 Long term (current) use of anticoagulants: Secondary | ICD-10-CM | POA: Diagnosis not present

## 2018-10-07 DIAGNOSIS — I2510 Atherosclerotic heart disease of native coronary artery without angina pectoris: Secondary | ICD-10-CM | POA: Diagnosis not present

## 2018-10-07 DIAGNOSIS — M1990 Unspecified osteoarthritis, unspecified site: Secondary | ICD-10-CM | POA: Diagnosis not present

## 2018-10-07 DIAGNOSIS — Z7951 Long term (current) use of inhaled steroids: Secondary | ICD-10-CM | POA: Diagnosis not present

## 2018-10-07 DIAGNOSIS — I10 Essential (primary) hypertension: Secondary | ICD-10-CM | POA: Diagnosis not present

## 2018-10-07 DIAGNOSIS — E785 Hyperlipidemia, unspecified: Secondary | ICD-10-CM | POA: Diagnosis not present

## 2018-10-07 DIAGNOSIS — R0600 Dyspnea, unspecified: Secondary | ICD-10-CM

## 2018-10-07 DIAGNOSIS — Z8546 Personal history of malignant neoplasm of prostate: Secondary | ICD-10-CM | POA: Diagnosis not present

## 2018-10-07 DIAGNOSIS — H9190 Unspecified hearing loss, unspecified ear: Secondary | ICD-10-CM | POA: Diagnosis not present

## 2018-10-07 DIAGNOSIS — Z79899 Other long term (current) drug therapy: Secondary | ICD-10-CM | POA: Diagnosis not present

## 2018-10-07 NOTE — Progress Notes (Signed)
Pulmonary Individual Treatment Plan  Patient Details  Name: James [name] MRN: [id number] Date of Birth: 01/20/1929 Referring Provider:     Pulmonary Rehab from 09/24/2018 in [facility] Cardiac and Pulmonary Rehab  Referring Provider  Ramachandran      Initial Encounter Date:    Pulmonary Rehab from 09/24/2018 in [facility] Cardiac and Pulmonary Rehab  Date  09/24/18      Visit Diagnosis: DOE (dyspnea on exertion)  Patient's Home Medications on Admission:  Current Outpatient Medications:  .  albuterol (PROAIR HFA) 108 (90 Base) MCG/ACT inhaler, INHALE 2 PUFFS INTO LUNGS EVERY 6 HOURS AS NEEDED FOR WHEEZING OR SHORTNESS OF BREATH, Disp: 8.5 g, Rfl: 5 .  atorvastatin (LIPITOR) 80 MG tablet, TAKE 1 TABLET BY MOUTH DAILY, Disp: 90 tablet, Rfl: 4 .  azelastine (OPTIVAR) 0.05 % ophthalmic solution, APPLY 1 DROP TO EYE TWICE DAILY, Disp: 6 mL, Rfl: 5 .  benazepril-hydrochlorthiazide (LOTENSIN HCT) 20-12.5 MG tablet, TAKE ONE TABLET BY MOUTH EVERY DAY, Disp: 90 tablet, Rfl: 3 .  beta carotene w/minerals (OCUVITE) tablet, Take 1 tablet by mouth daily., Disp: , Rfl:  .  chlorpheniramine-HYDROcodone (TUSSIONEX) 10-8 MG/5ML SUER, TAKE 1 TEASPOONFUL EVERY 12 HOURS AS NEEDED FOR COUGH, Disp: 115 mL, Rfl: 0 .  ELIQUIS 5 MG TABS tablet, TAKE ONE TABLET BY MOUTH TWICE DAILY, Disp: 60 tablet, Rfl: 11 .  famotidine (PEPCID) 20 MG tablet, , Disp: , Rfl: 1 .  fluticasone (FLONASE) 50 MCG/ACT nasal spray, USE 2 SPRAYS IN EACH NOSTRIL DAILY, Disp: 48 g, Rfl: 3 .  Fluticasone-Umeclidin-Vilant (TRELEGY ELLIPTA) 100-62.5-25 MCG/INH AEPB, Inhale 1 applicator into the lungs daily. Rinse mouth after use. Stop symbicort and spiriva when taking this medicine., Disp: 1 each, Rfl: 10 .  HYDROcodone-acetaminophen (NORCO) 10-325 MG tablet, TAKE 1 TABLET BY MOUTH EVERY 6 HOURS AS NEEDED, Disp: 30 tablet, Rfl: 0 .  ipratropium (ATROVENT) 0.03 % nasal spray, , Disp: , Rfl: 1 .  lansoprazole (PREVACID) 30 MG capsule, TAKE 1 CAPSULE BY  MOUTH EVERY DAY, Disp: 30 capsule, Rfl: 12 .  LORazepam (ATIVAN) 1 MG tablet, 1/2 TO 1 TABLET BY MOUTH AT BEDTIME AS NEEDED FOR SLEEP, Disp: 30 tablet, Rfl: 5 .  Magnesium 400 MG CAPS, Take 1 capsule by mouth daily. , Disp: , Rfl:  .  metoprolol tartrate (LOPRESSOR) 50 MG tablet, TAKE ONE TABLET BY MOUTH TWICE DAILY, Disp: 180 tablet, Rfl: 4 .  montelukast (SINGULAIR) 10 MG tablet, TAKE ONE TABLET EVERY DAY, Disp: 30 tablet, Rfl: 12 .  MULTIPLE VITAMIN PO, Take 1 tablet by mouth daily. , Disp: , Rfl:  .  Omega-3 Fatty Acids (FISH OIL) 1000 MG CAPS, Take 1 capsule daily by mouth., Disp: , Rfl:  .  SPIRIVA HANDIHALER 18 MCG inhalation capsule, INHALE THE CONTENTS OF ONE CAPSULE AS DIRECTED ONCE DAILY, Disp: 30 capsule, Rfl: 12 .  SYMBICORT 160-4.5 MCG/ACT inhaler, TAKE 2 PUFFS INTO LUNGS TWICE A DAY, Disp: 10.2 g, Rfl: 12 .  tamsulosin (FLOMAX) 0.4 MG CAPS capsule, TAKE 1 CAPSULE BY MOUTH DAILY, Disp: 90 capsule, Rfl: 3  Past Medical History: Past Medical History:  Diagnosis Date  . Arthritis   . Back pain   . CAD (coronary artery disease)   . Cataract    right  . COPD (chronic obstructive pulmonary disease) ([place])    SPiriva and SYmbicort daily. Albuterol as needed  . Diverticulosis   . Dyspnea    with exertion  . Enlarged prostate    takes Flomax daily  .  GERD (gastroesophageal reflux disease)   . History of chicken pox   . History of gout   . History of measles   . History of mumps   . HOH (hard of hearing)   . Hyperglycemia   . Hyperlipidemia    takes Atorvastatin daily  . Hypertension    takes Metoprolol daily as well as Lotensin HCT  . Hypotension   . Joint pain   . Microscopic colitis   . PAF (paroxysmal atrial fibrillation) ([place]), RVR 03/14/2017  . Pneumonia   . Prostate cancer ([place])   . Stroke [facility])    TIA  . TIA (transient ischemic attack)   . Weakness    numbness and tingling.mainly on right    Tobacco Use: Social History   Tobacco Use  Smoking Status  Former Smoker  . Packs/day: 1.00  . Years: 11.00  . Pack years: 11.00  . Types: Cigarettes  . Last attempt to quit: 02/12/1984  . Years since quitting: 34.6  Smokeless Tobacco Never Used  Tobacco Comment   quit smoking 30 yrs ago    Labs: Recent Review Flowsheet Data    Labs for ITP Cardiac and Pulmonary Rehab Latest Ref Rng & Units 10/05/2014 10/07/2014 10/31/2017   Cholestrol 0 - 200 mg/dL - 104 106   LDLCALC 0 - 99 mg/dL - 40 45   HDL >40 mg/dL - 52 52   Trlycerides <150 mg/dL - 61 47   Hemoglobin A1c 4.8 - 5.6 % 5.7 5.7 6.1(H)       Pulmonary Assessment Scores: Pulmonary Assessment Scores    Row Name 09/24/18 1132         ADL UCSD   ADL Phase  Entry     SOB Score total  36     Rest  0     Walk  4     Stairs  4     Bath  1     Dress  1     Shop  1       CAT Score   CAT Score  11       mMRC Score   mMRC Score  2        Pulmonary Function Assessment: Pulmonary Function Assessment - 09/24/18 1202      Breath   Bilateral Breath Sounds  Clear    Shortness of Breath  Yes;Limiting activity       Exercise Target Goals: Exercise Program Goal: Individual exercise prescription set using results from initial 6 min walk test and THRR while considering  patient's activity barriers and safety.   Exercise Prescription Goal: Initial exercise prescription builds to 30-45 minutes a day of aerobic activity, 2-3 days per week.  Home exercise guidelines will be given to patient during program as part of exercise prescription that the participant will acknowledge.  Activity Barriers & Risk Stratification:   6 Minute Walk: 6 Minute Walk    Row Name 09/24/18 1222         6 Minute Walk   Phase  Initial     Distance  900 feet     Walk Time  6 minutes     # of Rest Breaks  0     MPH  1.7     METS  0.93     RPE  13     Perceived Dyspnea   1     VO2 Peak  3.29     Symptoms  No     Resting  HR  53 bpm     Resting BP  106/56     Resting Oxygen Saturation   95 %      Exercise Oxygen Saturation  during 6 min walk  91 %     Max Ex. HR  89 bpm     Max Ex. BP  136/64     2 Minute Post BP  110/56       Interval HR   1 Minute HR  75     2 Minute HR  76     3 Minute HR  80     4 Minute HR  83     5 Minute HR  89     6 Minute HR  74     2 Minute Post HR  77     Interval Heart Rate?  Yes       Interval Oxygen   Interval Oxygen?  Yes     Baseline Oxygen Saturation %  95 %     1 Minute Oxygen Saturation %  92 %     1 Minute Liters of Oxygen  0 L     2 Minute Oxygen Saturation %  91 %     2 Minute Liters of Oxygen  0 L     3 Minute Oxygen Saturation %  92 %     3 Minute Liters of Oxygen  0 L     4 Minute Oxygen Saturation %  91 %     4 Minute Liters of Oxygen  0 L     5 Minute Oxygen Saturation %  92 %     5 Minute Liters of Oxygen  0 L     6 Minute Oxygen Saturation %  92 %     6 Minute Liters of Oxygen  0 L     2 Minute Post Oxygen Saturation %  94 %     2 Minute Post Liters of Oxygen  0 L       Oxygen Initial Assessment: Oxygen Initial Assessment - 09/24/18 1202      Home Oxygen   Home Oxygen Device  None    Sleep Oxygen Prescription  None    Home Exercise Oxygen Prescription  None    Home at Rest Exercise Oxygen Prescription  None      Initial 6 min Walk   Oxygen Used  None      Program Oxygen Prescription   Program Oxygen Prescription  None      Intervention   Short Term Goals  To learn and demonstrate proper pursed lip breathing techniques or other breathing techniques.;To learn and understand importance of maintaining oxygen saturations>88%;To learn and understand importance of monitoring SPO2 with pulse oximeter and demonstrate accurate use of the pulse oximeter.;To learn and demonstrate proper use of respiratory medications    Long  Term Goals  Verbalizes importance of monitoring SPO2 with pulse oximeter and return demonstration;Maintenance of O2 saturations>88%;Exhibits proper breathing techniques, such as pursed lip  breathing or other method taught during program session;Compliance with respiratory medication;Demonstrates proper use of MDI's       Oxygen Re-Evaluation: Oxygen Re-Evaluation    Row Name 09/30/18 1014             Program Oxygen Prescription   Program Oxygen Prescription  None         Home Oxygen   Home Oxygen Device  None       Sleep Oxygen Prescription  None  Home Exercise Oxygen Prescription  None       Home at Rest Exercise Oxygen Prescription  None         Goals/Expected Outcomes   Short Term Goals  To learn and demonstrate proper pursed lip breathing techniques or other breathing techniques.;To learn and understand importance of maintaining oxygen saturations>88%;To learn and understand importance of monitoring SPO2 with pulse oximeter and demonstrate accurate use of the pulse oximeter.;To learn and demonstrate proper use of respiratory medications       Long  Term Goals  Verbalizes importance of monitoring SPO2 with pulse oximeter and return demonstration;Maintenance of O2 saturations>88%;Exhibits proper breathing techniques, such as pursed lip breathing or other method taught during program session;Compliance with respiratory medication;Demonstrates proper use of MDI's       Comments  Reviewed PLB technique with pt.  Talked about how it work and it's important to maintaining his exercise saturations.         Goals/Expected Outcomes  Short: Become more profiecient at using PLB.   Long: Become independent at using PLB.          Oxygen Discharge (Final Oxygen Re-Evaluation): Oxygen Re-Evaluation - 09/30/18 1014      Program Oxygen Prescription   Program Oxygen Prescription  None      Home Oxygen   Home Oxygen Device  None    Sleep Oxygen Prescription  None    Home Exercise Oxygen Prescription  None    Home at Rest Exercise Oxygen Prescription  None      Goals/Expected Outcomes   Short Term Goals  To learn and demonstrate proper pursed lip breathing techniques or  other breathing techniques.;To learn and understand importance of maintaining oxygen saturations>88%;To learn and understand importance of monitoring SPO2 with pulse oximeter and demonstrate accurate use of the pulse oximeter.;To learn and demonstrate proper use of respiratory medications    Long  Term Goals  Verbalizes importance of monitoring SPO2 with pulse oximeter and return demonstration;Maintenance of O2 saturations>88%;Exhibits proper breathing techniques, such as pursed lip breathing or other method taught during program session;Compliance with respiratory medication;Demonstrates proper use of MDI's    Comments  Reviewed PLB technique with pt.  Talked about how it work and it's important to maintaining his exercise saturations.      Goals/Expected Outcomes  Short: Become more profiecient at using PLB.   Long: Become independent at using PLB.       Initial Exercise Prescription: Initial Exercise Prescription - 09/24/18 1200      Date of Initial Exercise RX and Referring Provider   Date  09/24/18    Referring Provider  Ramachandran      Treadmill   MPH  1    Grade  0    Minutes  15    METs  1.7      NuStep   Level  1    SPM  80    Minutes  15    METs  1.5      Biostep-RELP   Level  2    SPM  50    Minutes  15    METs  1.5      Prescription Details   Frequency (times per week)  3    Duration  Progress to 45 minutes of aerobic exercise without signs/symptoms of physical distress      Intensity   THRR 40-80% of Max Heartrate  85-117    Ratings of Perceived Exertion  11-15    Perceived Dyspnea  0-4      Resistance Training   Training Prescription  Yes    Weight  3 lb    Reps  10-15       Perform Capillary Blood Glucose checks as needed.  Exercise Prescription Changes:   Exercise Comments: Exercise Comments    Row Name 09/30/18 1013           Exercise Comments   First full day of exercise!  Patient was oriented to gym and equipment including functions,  settings, policies, and procedures.  Patient's individual exercise prescription and treatment plan were reviewed.  All starting workloads were established based on the results of the 6 minute walk test done at initial orientation visit.  The plan for exercise progression was also introduced and progression will be customized based on patient's performance and goals.          Exercise Goals and Review: Exercise Goals    Row Name 09/24/18 1221             Exercise Goals   Increase Physical Activity  Yes       Intervention  Provide advice, education, support and counseling about physical activity/exercise needs.;Develop an individualized exercise prescription for aerobic and resistive training based on initial evaluation findings, risk stratification, comorbidities and participant's personal goals.       Expected Outcomes  Short Term: Attend rehab on a regular basis to increase amount of physical activity.;Long Term: Add in home exercise to make exercise part of routine and to increase amount of physical activity.;Long Term: Exercising regularly at least 3-5 days a week.       Increase Strength and Stamina  Yes       Intervention  Provide advice, education, support and counseling about physical activity/exercise needs.;Develop an individualized exercise prescription for aerobic and resistive training based on initial evaluation findings, risk stratification, comorbidities and participant's personal goals.       Expected Outcomes  Short Term: Increase workloads from initial exercise prescription for resistance, speed, and METs.;Short Term: Perform resistance training exercises routinely during rehab and add in resistance training at home;Long Term: Improve cardiorespiratory fitness, muscular endurance and strength as measured by increased METs and functional capacity (6MWT)       Able to understand and use rate of perceived exertion (RPE) scale  Yes       Intervention  Provide education and  explanation on how to use RPE scale       Expected Outcomes  Short Term: Able to use RPE daily in rehab to express subjective intensity level;Long Term:  Able to use RPE to guide intensity level when exercising independently       Able to understand and use Dyspnea scale  Yes       Intervention  Provide education and explanation on how to use Dyspnea scale       Expected Outcomes  Short Term: Able to use Dyspnea scale daily in rehab to express subjective sense of shortness of breath during exertion;Long Term: Able to use Dyspnea scale to guide intensity level when exercising independently       Knowledge and understanding of Target Heart Rate Range (THRR)  Yes       Intervention  Provide education and explanation of THRR including how the numbers were predicted and where they are located for reference       Expected Outcomes  Short Term: Able to state/look up THRR;Short Term: Able to use daily as guideline for intensity in rehab;Long Term:  Able to use THRR to govern intensity when exercising independently       Able to check pulse independently  Yes       Intervention  Provide education and demonstration on how to check pulse in carotid and radial arteries.;Review the importance of being able to check your own pulse for safety during independent exercise       Expected Outcomes  Long Term: Able to check pulse independently and accurately;Short Term: Able to explain why pulse checking is important during independent exercise       Understanding of Exercise Prescription  Yes       Intervention  Provide education, explanation, and written materials on patient's individual exercise prescription       Expected Outcomes  Short Term: Able to explain program exercise prescription;Long Term: Able to explain home exercise prescription to exercise independently          Exercise Goals Re-Evaluation : Exercise Goals Re-Evaluation    [place] Name 09/30/18 1013             Exercise Goal Re-Evaluation    Exercise Goals Review  Increase Physical Activity;Increase Strength and Stamina;Able to understand and use rate of perceived exertion (RPE) scale;Able to understand and use Dyspnea scale;Knowledge and understanding of Target Heart Rate Range (THRR);Understanding of Exercise Prescription       Comments  Reviewed RPE scale, THR and program prescription with pt today.  Pt voiced understanding and was given a copy of goals to take home.       Expected Outcomes  Short: Use RPE daily to regulate intensity. Long: Follow program prescription in THR.          Discharge Exercise Prescription (Final Exercise Prescription Changes):   Nutrition:  Target Goals: Understanding of nutrition guidelines, daily intake of sodium <153m, cholesterol <2063m calories 30% from fat and 7% or less from saturated fats, daily to have 5 or more servings of fruits and vegetables.  Biometrics: Pre Biometrics - 09/24/18 1220      Pre Biometrics   Height  5' 4.5" (1.638 m)    Weight  186 lb 4.8 oz (84.5 kg)    Waist Circumference  42 inches    Hip Circumference  43 inches    Waist to Hip Ratio  0.98 %    BMI (Calculated)  31.5    Single Leg Stand  3.96 seconds        Nutrition Therapy Plan and Nutrition Goals: Nutrition Therapy & Goals - 09/24/18 1204      Personal Nutrition Goals   Nutrition Goal  Meet with the dietician      Intervention Plan   Intervention  Prescribe, educate and counsel regarding individualized specific dietary modifications aiming towards targeted core components such as weight, hypertension, lipid management, diabetes, heart failure and other comorbidities.    Expected Outcomes  Short Term Goal: Understand basic principles of dietary content, such as calories, fat, sodium, cholesterol and nutrients.;Long Term Goal: Adherence to prescribed nutrition plan.       Nutrition Assessments:   Nutrition Goals Re-Evaluation:   Nutrition Goals Discharge (Final Nutrition Goals  Re-Evaluation):   Psychosocial: Target Goals: Acknowledge presence or absence of significant depression and/or stress, maximize coping skills, provide positive support system. Participant is able to verbalize types and ability to use techniques and skills needed for reducing stress and depression.   Initial Review & Psychosocial Screening: Initial Psych Review & Screening - 09/24/18 1203      Initial Review  Current issues with  None Identified      Family Dynamics   Good Support System?  Yes    Comments  His wife of 66 years is a good support system      Barriers   Psychosocial barriers to participate in program  There are no identifiable barriers or psychosocial needs.;The patient should benefit from training in stress management and relaxation.      Screening Interventions   Interventions  Encouraged to exercise    Expected Outcomes  Short Term goal: Utilizing psychosocial counselor, staff and physician to assist with identification of specific Stressors or current issues interfering with healing process. Setting desired goal for each stressor or current issue identified.;Long Term Goal: Stressors or current issues are controlled or eliminated.;Short Term goal: Identification and review with participant of any Quality of Life or Depression concerns found by scoring the questionnaire.;Long Term goal: The participant improves quality of Life and PHQ9 Scores as seen by post scores and/or verbalization of changes       Quality of Life Scores:  Scores of 19 and below usually indicate a poorer quality of life in these areas.  A difference of  2-3 points is a clinically meaningful difference.  A difference of 2-3 points in the total score of the Quality of Life Index has been associated with significant improvement in overall quality of life, self-image, physical symptoms, and general health in studies assessing change in quality of life.  PHQ-9: Recent Review Flowsheet Data    Depression  screen [facility] 2/9 09/24/2018 05/08/2018 02/13/2018 02/09/2017 10/24/2016   Decreased Interest 0 0 2 0 0   Down, Depressed, Hopeless 0 0 2 0 0   PHQ - 2 Score 0 0 4 0 0   Altered sleeping 0 - '2 2 1   ' Tired, decreased energy 1 - '2 1 2   ' Change in appetite 2 - 2 0 0   Feeling bad or failure about yourself  0 - 2 0 2    Trouble concentrating 0 - 0 0 1   Moving slowly or fidgety/restless 0 - 0 0 0   Suicidal thoughts 0 - 0 0 0   PHQ-9 Score 3 - '12 3 6   ' Difficult doing work/chores Not difficult at all - Very difficult Not difficult at all Somewhat difficult     Interpretation of Total Score  Total Score Depression Severity:  1-4 = Minimal depression, 5-9 = Mild depression, 10-14 = Moderate depression, 15-19 = Moderately severe depression, 20-27 = Severe depression   Psychosocial Evaluation and Intervention:   Psychosocial Re-Evaluation:   Psychosocial Discharge (Final Psychosocial Re-Evaluation):   Education: Education Goals: Education classes will be provided on a weekly basis, covering required topics. Participant will state understanding/return demonstration of topics presented.  Learning Barriers/Preferences: Learning Barriers/Preferences - 09/24/18 1121      Learning Barriers/Preferences   Learning Barriers  Hearing    Learning Preferences  None       Education Topics:  Initial Evaluation Education: - Verbal, written and demonstration of respiratory meds, oximetry and breathing techniques. Instruction on use of nebulizers and MDIs and importance of monitoring MDI activations.   Pulmonary Rehab from 10/02/2018 in [facility] Cardiac and Pulmonary Rehab  Date  09/24/18  Educator  [facility]  Instruction Review Code  1- Verbalizes Understanding      General Nutrition Guidelines/Fats and Fiber: -Group instruction provided by verbal, written material, models and posters to present the general guidelines for heart healthy nutrition. Gives an  explanation and review of dietary fats and fiber.    Pulmonary Rehab from 02/21/2017 in [facility] Cardiac and Pulmonary Rehab  Date  02/19/17  Educator  CR  Instruction Review Code (retired)  2- meets goals/outcomes      Controlling Sodium/Reading Food Labels: -Group verbal and written material supporting the discussion of sodium use in heart healthy nutrition. Review and explanation with models, verbal and written materials for utilization of the food label.   Pulmonary Rehab from 02/21/2017 in [facility] Cardiac and Pulmonary Rehab  Date  01/08/17  Educator  CR  Instruction Review Code (retired)  2- meets goals/outcomes      Exercise Physiology & General Exercise Guidelines: - Group verbal and written instruction with models to review the exercise physiology of the cardiovascular system and associated critical values. Provides general exercise guidelines with specific guidelines to those with heart or lung disease.    Pulmonary Rehab from 02/21/2017 in [facility] Cardiac and Pulmonary Rehab  Date  12/20/16  Educator  [facility]  Instruction Review Code (retired)  2- meets goals/outcomes      Aerobic Exercise & Resistance Training: - Gives group verbal and written instruction on the various components of exercise. Focuses on aerobic and resistive training programs and the benefits of this training and how to safely progress through these programs.   Pulmonary Rehab from 02/21/2017 in [facility] Cardiac and Pulmonary Rehab  Date  01/17/17  Educator  [facility]  Instruction Review Code (retired)  2- Statistician, Balance, Mind/Body Relaxation: Provides group verbal/written instruction on the benefits of flexibility and balance training, including mind/body exercise modes such as yoga, pilates and tai chi.  Demonstration and skill practice provided.   Pulmonary Rehab from 02/21/2017 in [facility] Cardiac and Pulmonary Rehab  Date  02/09/17  Educator  AS  Instruction Review Code (retired)  2- meets goals/outcomes      Stress and Anxiety: - Provides group  verbal and written instruction about the health risks of elevated stress and causes of high stress.  Discuss the correlation between heart/lung disease and anxiety and treatment options. Review healthy ways to manage with stress and anxiety.   Pulmonary Rehab from 02/21/2017 in [facility] Cardiac and Pulmonary Rehab  Date  12/27/16  Educator  [facility]  Instruction Review Code (retired)  2- meets goals/outcomes      Depression: - Provides group verbal and written instruction on the correlation between heart/lung disease and depressed mood, treatment options, and the stigmas associated with seeking treatment.   Pulmonary Rehab from 10/02/2018 in [facility] Cardiac and Pulmonary Rehab  Date  10/02/18  Educator  [facility]  Instruction Review Code  1- Verbalizes Understanding      Exercise & Equipment Safety: - Individual verbal instruction and demonstration of equipment use and safety with use of the equipment.   Pulmonary Rehab from 10/02/2018 in [facility] Cardiac and Pulmonary Rehab  Date  09/24/18  Educator  [facility]  Instruction Review Code  1- Verbalizes Understanding      Infection Prevention: - Provides verbal and written material to individual with discussion of infection control including proper hand washing and proper equipment cleaning during exercise session.   Pulmonary Rehab from 10/02/2018 in [facility] Cardiac and Pulmonary Rehab  Date  09/24/18  Educator  [facility]  Instruction Review Code  1- Verbalizes Understanding      Falls Prevention: - Provides verbal and written material to individual with discussion of falls prevention and safety.   Pulmonary Rehab from 10/02/2018 in  [place] Cardiac and Pulmonary Rehab  Date  09/24/18  Educator  [facility]  Instruction Review Code  1- Verbalizes Understanding      Diabetes: - Individual verbal and written instruction to review signs/symptoms of diabetes, desired ranges of glucose level fasting, after meals and with exercise. Advice that pre and post exercise glucose checks will  be done for 3 sessions at entry of program.   Chronic Lung Diseases: - Group verbal and written instruction to review updates, respiratory medications, advancements in procedures and treatments. Discuss use of supplemental oxygen including available portable oxygen systems, continuous and intermittent flow rates, concentrators, personal use and safety guidelines. Review proper use of inhaler and spacers. Provide informative websites for self-education.    Pulmonary Rehab from 02/21/2017 in [facility] Cardiac and Pulmonary Rehab  Date  01/10/17  Educator  [facility]  Instruction Review Code (retired)  2- meets Chief Financial Officer: - Provide group verbal and written instruction for methods to conserve energy, plan and organize activities. Instruct on pacing techniques, use of adaptive equipment and posture/positioning to relieve shortness of breath.   Triggers and Exacerbations: - Group verbal and written instruction to review types of environmental triggers and ways to prevent exacerbations. Discuss weather changes, air quality and the benefits of nasal washing. Review warning signs and symptoms to help prevent infections. Discuss techniques for effective airway clearance, coughing, and vibrations.   Pulmonary Rehab from 02/21/2017 in [facility] Cardiac and Pulmonary Rehab  Date  02/07/17  Educator  [facility]  Instruction Review Code (retired)  2- meets goals/outcomes      AED/CPR: - Group verbal and written instruction with the use of models to demonstrate the basic use of the AED with the basic ABC's of resuscitation.   Anatomy and Physiology of the Lungs: - Group verbal and written instruction with the use of models to provide basic lung anatomy and physiology related to function, structure and complications of lung disease.   Anatomy & Physiology of the Heart: - Group verbal and written instruction and models provide basic cardiac anatomy and physiology, with the coronary electrical and  arterial systems. Review of Valvular disease and Heart Failure   Pulmonary Rehab from 02/21/2017 in [facility] Cardiac and Pulmonary Rehab  Date  02/02/17  Educator  CE  Instruction Review Code (retired)  2- meets goals/outcomes      Cardiac Medications: - Group verbal and written instruction to review commonly prescribed medications for heart disease. Reviews the medication, class of the drug, and side effects.   Know Your Numbers and Risk Factors: -Group verbal and written instruction about important numbers in your health.  Discussion of what are risk factors and how they play a role in the disease process.  Review of Cholesterol, Blood Pressure, Diabetes, and BMI and the role they play in your overall health.   Sleep Hygiene: -Provides group verbal and written instruction about how sleep can affect your health.  Define sleep hygiene, discuss sleep cycles and impact of sleep habits. Review good sleep hygiene tips.    Other: -Provides group and verbal instruction on various topics (see comments)    Knowledge Questionnaire Score: Knowledge Questionnaire Score - 09/24/18 1135      Knowledge Questionnaire Score   Pre Score  12/18   REVIEWED with patient       Core Components/Risk Factors/Patient Goals at Admission: Personal Goals and Risk Factors at Admission - 09/24/18 1205      Core Components/Risk Factors/Patient Goals on Admission  Weight Management  Yes;Weight Loss    Intervention  Weight Management: Develop a combined nutrition and exercise program designed to reach desired caloric intake, while maintaining appropriate intake of nutrient and fiber, sodium and fats, and appropriate energy expenditure required for the weight goal.;Weight Management: Provide education and appropriate resources to help participant work on and attain dietary goals.;Weight Management/Obesity: Establish reasonable short term and long term weight goals.    Admit Weight  186 lb 4.8 oz (84.5 kg)    Goal  Weight: Short Term  181 lb (82.1 kg)    Goal Weight: Long Term  175 lb (79.4 kg)    Expected Outcomes  Short Term: Continue to assess and modify interventions until short term weight is achieved;Long Term: Adherence to nutrition and physical activity/exercise program aimed toward attainment of established weight goal;Weight Loss: Understanding of general recommendations for a balanced deficit meal plan, which promotes 1-2 lb weight loss per week and includes a negative energy balance of [phone] kcal/d;Understanding recommendations for meals to include 15-35% energy as protein, 25-35% energy from fat, 35-60% energy from carbohydrates, less than 27m of dietary cholesterol, 20-35 gm of total fiber daily;Understanding of distribution of calorie intake throughout the day with the consumption of 4-5 meals/snacks    Improve shortness of breath with ADL's  Yes    Intervention  Provide education, individualized exercise plan and daily activity instruction to help decrease symptoms of SOB with activities of daily living.    Expected Outcomes  Short Term: Improve cardiorespiratory fitness to achieve a reduction of symptoms when performing ADLs;Long Term: Be able to perform more ADLs without symptoms or delay the onset of symptoms    Hypertension  Yes    Intervention  Provide education on lifestyle modifcations including regular physical activity/exercise, weight management, moderate sodium restriction and increased consumption of fresh fruit, vegetables, and low fat dairy, alcohol moderation, and smoking cessation.;Monitor prescription use compliance.    Expected Outcomes  Short Term: Continued assessment and intervention until BP is < 140/971mHG in hypertensive participants. < 130/8087mG in hypertensive participants with diabetes, heart failure or chronic kidney disease.;Long Term: Maintenance of blood pressure at goal levels.    Lipids  Yes    Intervention  Provide education and support for participant on  nutrition & aerobic/resistive exercise along with prescribed medications to achieve LDL <22m33mDL >40mg57m Expected Outcomes  Short Term: Participant states understanding of desired cholesterol values and is compliant with medications prescribed. Participant is following exercise prescription and nutrition guidelines.;Long Term: Cholesterol controlled with medications as prescribed, with individualized exercise RX and with personalized nutrition plan. Value goals: LDL < 22mg,22m > 40 mg.       Core Components/Risk Factors/Patient Goals Review:    Core Components/Risk Factors/Patient Goals at Discharge (Final Review):    ITP Comments: ITP Comments    Row Name 09/24/18 1120 10/07/18 0821         ITP Comments  Medical Evaluation completed. Chart sent for review and changes to Dr. Mark MEmily Filberttor of [facility] can be found in [facility] encounter 09/03/18.  30 day review completed. ITP sent to Dr. Mark MEmily Filberttor of [facility] with ITP unless changes are made by physician.         Comments: 30 day review

## 2018-10-07 NOTE — Progress Notes (Signed)
Daily Session Note  Patient Details  Name: James [name] MRN: [id number] Date of Birth: [birth year] Referring Provider:     Pulmonary Rehab from 09/24/2018 in [facility] Cardiac and Pulmonary Rehab  Referring Provider  Ashby Dawes      Encounter Date: 10/07/2018  Check In: Session Check In - 10/07/18 1012      Check-In   Supervising physician immediately available to respond to emergencies  [facility] immediately available ER MD    Physician(s)  Dr. Joni Fears and Dr. Corky Downs    Location  [facility]-Cardiac & Pulmonary Rehab    Staff Present  Earlean Shawl, BS, ACSM CEP, Exercise Physiologist;Joseph [facility], [place], ACSM CEP, Exercise Physiologist    Medication changes reported      No    Fall or balance concerns reported     No    Tobacco Cessation  No Change    Warm-up and Cool-down  Performed as group-led instruction    Resistance Training Performed  Yes    VAD Patient?  No    PAD/SET Patient?  No      Pain Assessment   Currently in Pain?  No/denies    Multiple Pain Sites  No          Social History   Tobacco Use  Smoking Status Former Smoker  . Packs/day: 1.00  . Years: 11.00  . Pack years: 11.00  . Types: Cigarettes  . Last attempt to quit: 02/12/1984  . Years since quitting: 34.6  Smokeless Tobacco Never Used  Tobacco Comment   quit smoking 30 yrs ago    Goals Met:  Proper associated with RPD/PD & O2 Sat Independence with exercise equipment Exercise tolerated well No report of cardiac concerns or symptoms Strength training completed today  Goals Unmet:  Not Applicable  Comments: Pt able to follow exercise prescription today without complaint.  Will continue to monitor for progression.    Dr. Emily Filbert is Medical Director for [place] and [facility].

## 2018-10-09 DIAGNOSIS — Z7951 Long term (current) use of inhaled steroids: Secondary | ICD-10-CM | POA: Diagnosis not present

## 2018-10-09 DIAGNOSIS — I480 Paroxysmal atrial fibrillation: Secondary | ICD-10-CM | POA: Diagnosis not present

## 2018-10-09 DIAGNOSIS — J449 Chronic obstructive pulmonary disease, unspecified: Secondary | ICD-10-CM | POA: Diagnosis not present

## 2018-10-09 DIAGNOSIS — R0609 Other forms of dyspnea: Secondary | ICD-10-CM | POA: Diagnosis not present

## 2018-10-09 DIAGNOSIS — Z8673 Personal history of transient ischemic attack (TIA), and cerebral infarction without residual deficits: Secondary | ICD-10-CM | POA: Diagnosis not present

## 2018-10-09 DIAGNOSIS — Z79899 Other long term (current) drug therapy: Secondary | ICD-10-CM | POA: Diagnosis not present

## 2018-10-09 DIAGNOSIS — Z7901 Long term (current) use of anticoagulants: Secondary | ICD-10-CM | POA: Diagnosis not present

## 2018-10-09 DIAGNOSIS — M1990 Unspecified osteoarthritis, unspecified site: Secondary | ICD-10-CM | POA: Diagnosis not present

## 2018-10-09 DIAGNOSIS — Z87891 Personal history of nicotine dependence: Secondary | ICD-10-CM | POA: Diagnosis not present

## 2018-10-09 DIAGNOSIS — N400 Enlarged prostate without lower urinary tract symptoms: Secondary | ICD-10-CM | POA: Diagnosis not present

## 2018-10-09 DIAGNOSIS — H9190 Unspecified hearing loss, unspecified ear: Secondary | ICD-10-CM | POA: Diagnosis not present

## 2018-10-09 DIAGNOSIS — E785 Hyperlipidemia, unspecified: Secondary | ICD-10-CM | POA: Diagnosis not present

## 2018-10-09 DIAGNOSIS — Z8546 Personal history of malignant neoplasm of prostate: Secondary | ICD-10-CM | POA: Diagnosis not present

## 2018-10-09 DIAGNOSIS — R0600 Dyspnea, unspecified: Secondary | ICD-10-CM

## 2018-10-09 DIAGNOSIS — K219 Gastro-esophageal reflux disease without esophagitis: Secondary | ICD-10-CM | POA: Diagnosis not present

## 2018-10-09 DIAGNOSIS — I2510 Atherosclerotic heart disease of native coronary artery without angina pectoris: Secondary | ICD-10-CM | POA: Diagnosis not present

## 2018-10-09 DIAGNOSIS — I10 Essential (primary) hypertension: Secondary | ICD-10-CM | POA: Diagnosis not present

## 2018-10-09 NOTE — Progress Notes (Signed)
Daily Session Note  Patient Details  Name: James [name] MRN: [id number] Date of Birth: [birth year] Referring Provider:     Pulmonary Rehab from 09/24/2018 in [facility] Cardiac and Pulmonary Rehab  Referring Provider  Ashby Dawes      Encounter Date: 10/09/2018  Check In: Session Check In - 10/09/18 1008      Check-In   Supervising physician immediately available to respond to emergencies  [facility] immediately available ER MD    Physician(s)  Joni Fears and Darl Householder    Location  [facility]-Cardiac & Pulmonary Rehab    Staff Present  Alberteen Sam, MA, RCEP, CCRP, Exercise Physiologist;Joseph Foy [place], [place], ACSM CEP, Exercise Physiologist    Medication changes reported      No    Fall or balance concerns reported     No    Warm-up and Cool-down  Performed as group-led instruction    Resistance Training Performed  Yes    VAD Patient?  No    PAD/SET Patient?  No      Pain Assessment   Currently in Pain?  No/denies    Multiple Pain Sites  No          Social History   Tobacco Use  Smoking Status Former Smoker  . Packs/day: 1.00  . Years: 11.00  . Pack years: 11.00  . Types: Cigarettes  . Last attempt to quit: 02/12/1984  . Years since quitting: 34.6  Smokeless Tobacco Never Used  Tobacco Comment   quit smoking 30 yrs ago    Goals Met:  Proper associated with RPD/PD & O2 Sat Independence with exercise equipment Exercise tolerated well Strength training completed today  Goals Unmet:  Not Applicable  Comments: Pt able to follow exercise prescription today without complaint.  Will continue to monitor for progression.    Dr. Emily Filbert is Medical Director for [place] and [facility].

## 2018-10-10 ENCOUNTER — Ambulatory Visit: Payer: Medicare Other | Admitting: Family Medicine

## 2018-10-10 ENCOUNTER — Encounter: Payer: Self-pay | Admitting: Family Medicine

## 2018-10-10 VITALS — BP 122/62 | HR 66 | Temp 97.6°F | Wt 195.0 lb

## 2018-10-10 DIAGNOSIS — J441 Chronic obstructive pulmonary disease with (acute) exacerbation: Secondary | ICD-10-CM

## 2018-10-10 MED ORDER — DOXYCYCLINE HYCLATE 100 MG PO TABS: 100.0000 mg | ORAL_TABLET | Freq: Two times a day (BID) | ORAL | 0 refills | Status: AC

## 2018-10-10 MED ORDER — PREDNISONE 20 MG PO TABS: 20.0000 mg | ORAL_TABLET | Freq: Two times a day (BID) | ORAL | 0 refills | Status: AC

## 2018-10-10 NOTE — Progress Notes (Signed)
Patient: James [name] Male    DOB: 09/16/1929   84 y.o.   MRN: [id number] Visit Date: 10/10/2018  Today's Provider: Lelon Huh, MD   Chief Complaint  Patient presents with  . URI    Started about a week ago.   Subjective:    URI   This is a new problem. The current episode started in the past 7 days. The problem has been unchanged. Associated symptoms include congestion, coughing, rhinorrhea, sneezing and wheezing. Pertinent negatives include no ear pain, headaches, plugged ear sensation, sinus pain or sore throat.  Productive clear mucous. Inhalers switched to Trelegy about a month ago and breathing improved, but became more short of breath again about a week ago. Is still taking Singular and fluticasone.   Dg Chest 2 View  Result Date: 09/17/2018 CLINICAL DATA:  Short of breath for several years, history of right lower lobe lung cancer, follow-up EXAM: CHEST - 2 VIEW COMPARISON:  CT chest of 07/02/2018 and chest x-ray of 04/05/2017 FINDINGS: There is little change in chronic elevation of the right hemidiaphragm with linear scarring at the right lung base. There is no evidence of recurrence of lung carcinoma. No pneumonia or effusion is seen. Mediastinal and hilar contours are stable and mild cardiomegaly is stable. 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 No acute bony abnormality is seen. IMPRESSION: Stable chest x-ray with chronic elevation of the right hemidiaphragm. No evidence of metastatic involvement of the lungs. Electronically Signed   By: Ivar Drape M.D.   On: 09/17/2018 16:04       Allergies  Allergen Reactions  . Indomethacin Nausea Only     Current Outpatient Medications:  .  albuterol (PROAIR HFA) 108 (90 Base) MCG/ACT inhaler, INHALE 2 PUFFS INTO LUNGS EVERY 6 HOURS AS NEEDED FOR WHEEZING OR SHORTNESS OF BREATH, Disp: 8.5 g, Rfl: 5 .  atorvastatin (LIPITOR) 80 MG tablet, TAKE 1 TABLET BY MOUTH DAILY, Disp: 90 tablet, Rfl: 4 .  azelastine  (OPTIVAR) 0.05 % ophthalmic solution, APPLY 1 DROP TO EYE TWICE DAILY, Disp: 6 mL, Rfl: 5 .  benazepril-hydrochlorthiazide (LOTENSIN HCT) 20-12.5 MG tablet, TAKE ONE TABLET BY MOUTH EVERY DAY, Disp: 90 tablet, Rfl: 3 .  beta carotene w/minerals (OCUVITE) tablet, Take 1 tablet by mouth daily., Disp: , Rfl:  .  chlorpheniramine-HYDROcodone (TUSSIONEX) 10-8 MG/5ML SUER, TAKE 1 TEASPOONFUL EVERY 12 HOURS AS NEEDED FOR COUGH, Disp: 115 mL, Rfl: 0 .  ELIQUIS 5 MG TABS tablet, TAKE ONE TABLET BY MOUTH TWICE DAILY, Disp: 60 tablet, Rfl: 11 .  famotidine (PEPCID) 20 MG tablet, , Disp: , Rfl: 1 .  fluticasone (FLONASE) 50 MCG/ACT nasal spray, USE 2 SPRAYS IN EACH NOSTRIL DAILY, Disp: 48 g, Rfl: 3 .  Fluticasone-Umeclidin-Vilant (TRELEGY ELLIPTA) 100-62.5-25 MCG/INH AEPB, Inhale 1 applicator into the lungs daily. Rinse mouth after use. Stop symbicort and spiriva when taking this medicine., Disp: 1 each, Rfl: 10 .  HYDROcodone-acetaminophen (NORCO) 10-325 MG tablet, TAKE 1 TABLET BY MOUTH EVERY 6 HOURS AS NEEDED, Disp: 30 tablet, Rfl: 0 .  ipratropium (ATROVENT) 0.03 % nasal spray, , Disp: , Rfl: 1 .  lansoprazole (PREVACID) 30 MG capsule, TAKE 1 CAPSULE BY MOUTH EVERY DAY, Disp: 30 capsule, Rfl: 12 .  LORazepam (ATIVAN) 1 MG tablet, 1/2 TO 1 TABLET BY MOUTH AT BEDTIME AS NEEDED FOR SLEEP, Disp: 30 tablet, Rfl: 5 .  Magnesium 400 MG CAPS, Take 1 capsule by mouth daily. , Disp: , Rfl:  .  metoprolol tartrate (LOPRESSOR) 50 MG tablet, TAKE ONE TABLET BY MOUTH TWICE DAILY, Disp: 180 tablet, Rfl: 4 .  montelukast (SINGULAIR) 10 MG tablet, TAKE ONE TABLET EVERY DAY, Disp: 30 tablet, Rfl: 12 .  MULTIPLE VITAMIN PO, Take 1 tablet by mouth daily. , Disp: , Rfl:  .  Omega-3 Fatty Acids (FISH OIL) 1000 MG CAPS, Take 1 capsule daily by mouth., Disp: , Rfl:  .  tamsulosin (FLOMAX) 0.4 MG CAPS capsule, TAKE 1 CAPSULE BY MOUTH DAILY, Disp: 90 capsule, Rfl: 3 .  SPIRIVA HANDIHALER 18 MCG inhalation capsule, INHALE THE  CONTENTS OF ONE CAPSULE AS DIRECTED ONCE DAILY (Patient not taking: Reported on 10/10/2018), Disp: 30 capsule, Rfl: 12 .  SYMBICORT 160-4.5 MCG/ACT inhaler, TAKE 2 PUFFS INTO LUNGS TWICE A DAY (Patient not taking: Reported on 10/10/2018), Disp: 10.2 g, Rfl: 12  Review of Systems  Constitutional: Negative.   HENT: Positive for congestion, rhinorrhea and sneezing. Negative for dental problem, ear discharge, ear pain, hearing loss, nosebleeds, postnasal drip, sinus pressure, sinus pain, sore throat, tinnitus, trouble swallowing and voice change.   Eyes: Positive for discharge. Negative for photophobia, pain, redness, itching and visual disturbance.  Respiratory: Positive for cough, choking, shortness of breath and wheezing. Negative for apnea, chest tightness and stridor.   Gastrointestinal: Negative.   Neurological: Negative for dizziness, light-headedness and headaches.    Social History   Tobacco Use  . Smoking status: Former Smoker    Packs/day: 1.00    Years: 11.00    Pack years: 11.00    Types: Cigarettes    Last attempt to quit: 02/12/1984    Years since quitting: 34.6  . Smokeless tobacco: Never Used  . Tobacco comment: quit smoking 30 yrs ago  Substance Use Topics  . Alcohol use: Yes    Alcohol/week: 0.0 standard drinks    Comment: beer occasionally   Objective:   BP 122/62 (BP Location: Right Arm, Patient Position: Sitting, Cuff Size: Large)   Pulse 66   Temp 97.6 F (36.4 C) (Oral)   Wt 195 lb (88.5 kg)   SpO2 96%   BMI 32.95 kg/m  Vitals:   10/10/18 1103  BP: 122/62  Pulse: 66  Temp: 97.6 F (36.4 C)  TempSrc: Oral  SpO2: 96%  Weight: 195 lb (88.5 kg)     Physical Exam   General Appearance:    Alert, cooperative, no distress  Eyes:    PERRL, conjunctiva/corneas clear, EOM's intact       Lungs:     Clear to auscultation bilaterally, respirations unlabored  Heart:    Regular rate and rhythm  Neurologic:   Awake, alert, oriented x 3. No apparent focal  neurological           defect.           Assessment & Plan:     1. COPD exacerbation (HCC)  - doxycycline (VIBRA-TABS) 100 MG tablet; Take 1 tablet (100 mg total) by mouth 2 (two) times daily for 7 days.  Dispense: 14 tablet; Refill: 0 - predniSONE (DELTASONE) 20 MG tablet; Take 1 tablet (20 mg total) by mouth 2 (two) times daily with a meal for 5 days.  Dispense: 10 tablet; Refill: 0  Call if symptoms change or if not rapidly improving.          Lelon Huh, MD  [facility]

## 2018-10-18 ENCOUNTER — Encounter: Payer: Medicare Other | Attending: Internal Medicine

## 2018-10-18 DIAGNOSIS — M1990 Unspecified osteoarthritis, unspecified site: Secondary | ICD-10-CM | POA: Insufficient documentation

## 2018-10-18 DIAGNOSIS — I2510 Atherosclerotic heart disease of native coronary artery without angina pectoris: Secondary | ICD-10-CM | POA: Insufficient documentation

## 2018-10-18 DIAGNOSIS — Z8673 Personal history of transient ischemic attack (TIA), and cerebral infarction without residual deficits: Secondary | ICD-10-CM | POA: Insufficient documentation

## 2018-10-18 DIAGNOSIS — Z7901 Long term (current) use of anticoagulants: Secondary | ICD-10-CM | POA: Insufficient documentation

## 2018-10-18 DIAGNOSIS — E785 Hyperlipidemia, unspecified: Secondary | ICD-10-CM | POA: Insufficient documentation

## 2018-10-18 DIAGNOSIS — I480 Paroxysmal atrial fibrillation: Secondary | ICD-10-CM | POA: Insufficient documentation

## 2018-10-18 DIAGNOSIS — R0609 Other forms of dyspnea: Secondary | ICD-10-CM | POA: Insufficient documentation

## 2018-10-18 DIAGNOSIS — Z8546 Personal history of malignant neoplasm of prostate: Secondary | ICD-10-CM | POA: Insufficient documentation

## 2018-10-18 DIAGNOSIS — K219 Gastro-esophageal reflux disease without esophagitis: Secondary | ICD-10-CM | POA: Insufficient documentation

## 2018-10-18 DIAGNOSIS — Z87891 Personal history of nicotine dependence: Secondary | ICD-10-CM | POA: Insufficient documentation

## 2018-10-18 DIAGNOSIS — J449 Chronic obstructive pulmonary disease, unspecified: Secondary | ICD-10-CM | POA: Insufficient documentation

## 2018-10-18 DIAGNOSIS — I10 Essential (primary) hypertension: Secondary | ICD-10-CM | POA: Insufficient documentation

## 2018-10-18 DIAGNOSIS — H9190 Unspecified hearing loss, unspecified ear: Secondary | ICD-10-CM | POA: Insufficient documentation

## 2018-10-18 DIAGNOSIS — Z7951 Long term (current) use of inhaled steroids: Secondary | ICD-10-CM | POA: Insufficient documentation

## 2018-10-18 DIAGNOSIS — Z79899 Other long term (current) drug therapy: Secondary | ICD-10-CM | POA: Insufficient documentation

## 2018-10-18 DIAGNOSIS — N400 Enlarged prostate without lower urinary tract symptoms: Secondary | ICD-10-CM | POA: Insufficient documentation

## 2018-10-21 ENCOUNTER — Other Ambulatory Visit: Payer: Self-pay | Admitting: Family Medicine

## 2018-10-21 DIAGNOSIS — R0609 Other forms of dyspnea: Principal | ICD-10-CM

## 2018-10-21 DIAGNOSIS — H9190 Unspecified hearing loss, unspecified ear: Secondary | ICD-10-CM | POA: Diagnosis not present

## 2018-10-21 DIAGNOSIS — M1990 Unspecified osteoarthritis, unspecified site: Secondary | ICD-10-CM | POA: Diagnosis not present

## 2018-10-21 DIAGNOSIS — I10 Essential (primary) hypertension: Secondary | ICD-10-CM | POA: Diagnosis not present

## 2018-10-21 DIAGNOSIS — Z8673 Personal history of transient ischemic attack (TIA), and cerebral infarction without residual deficits: Secondary | ICD-10-CM | POA: Diagnosis not present

## 2018-10-21 DIAGNOSIS — K219 Gastro-esophageal reflux disease without esophagitis: Secondary | ICD-10-CM | POA: Diagnosis not present

## 2018-10-21 DIAGNOSIS — E785 Hyperlipidemia, unspecified: Secondary | ICD-10-CM | POA: Diagnosis not present

## 2018-10-21 DIAGNOSIS — Z87891 Personal history of nicotine dependence: Secondary | ICD-10-CM | POA: Diagnosis not present

## 2018-10-21 DIAGNOSIS — N400 Enlarged prostate without lower urinary tract symptoms: Secondary | ICD-10-CM | POA: Diagnosis not present

## 2018-10-21 DIAGNOSIS — Z7951 Long term (current) use of inhaled steroids: Secondary | ICD-10-CM | POA: Diagnosis not present

## 2018-10-21 DIAGNOSIS — J449 Chronic obstructive pulmonary disease, unspecified: Secondary | ICD-10-CM | POA: Diagnosis not present

## 2018-10-21 DIAGNOSIS — Z7901 Long term (current) use of anticoagulants: Secondary | ICD-10-CM | POA: Diagnosis not present

## 2018-10-21 DIAGNOSIS — R0600 Dyspnea, unspecified: Secondary | ICD-10-CM

## 2018-10-21 DIAGNOSIS — I2510 Atherosclerotic heart disease of native coronary artery without angina pectoris: Secondary | ICD-10-CM | POA: Diagnosis not present

## 2018-10-21 DIAGNOSIS — I480 Paroxysmal atrial fibrillation: Secondary | ICD-10-CM | POA: Diagnosis not present

## 2018-10-21 DIAGNOSIS — Z79899 Other long term (current) drug therapy: Secondary | ICD-10-CM | POA: Diagnosis not present

## 2018-10-21 DIAGNOSIS — Z8546 Personal history of malignant neoplasm of prostate: Secondary | ICD-10-CM | POA: Diagnosis not present

## 2018-10-21 NOTE — Progress Notes (Signed)
Daily Session Note  Patient Details  Name: James [name] MRN: [id number] Date of Birth: Sep 07, 1929 Referring Provider:     Pulmonary Rehab from 09/24/2018 in [facility] Cardiac and Pulmonary Rehab  Referring Provider  Ashby Dawes      Encounter Date: 10/21/2018  Check In: Session Check In - 10/21/18 0948      Check-In   Supervising physician immediately available to respond to emergencies  [facility] immediately available ER MD    Physician(s)  Dr. Corky Downs and [facility]    Location  [facility]-Cardiac & Pulmonary Rehab    Staff Present  Justin Mend RCP,RRT,BSRT;Kelly Amedeo Plenty, BS, ACSM CEP, Exercise Physiologist    Medication changes reported      No    Fall or balance concerns reported     No    Warm-up and Cool-down  Performed as group-led instruction    Resistance Training Performed  Yes    VAD Patient?  No    PAD/SET Patient?  No      Pain Assessment   Currently in Pain?  No/denies          Social History   Tobacco Use  Smoking Status Former Smoker  . Packs/day: 1.00  . Years: 11.00  . Pack years: 11.00  . Types: Cigarettes  . Last attempt to quit: 02/12/1984  . Years since quitting: 34.7  Smokeless Tobacco Never Used  Tobacco Comment   quit smoking 30 yrs ago    Goals Met:  Independence with exercise equipment Exercise tolerated well No report of cardiac concerns or symptoms Strength training completed today  Goals Unmet:  Not Applicable  Comments: Pt able to follow exercise prescription today without complaint.  Will continue to monitor for progression.    Dr. Emily Filbert is Medical Director for [place] and [facility].

## 2018-10-23 DIAGNOSIS — M1990 Unspecified osteoarthritis, unspecified site: Secondary | ICD-10-CM | POA: Diagnosis not present

## 2018-10-23 DIAGNOSIS — N400 Enlarged prostate without lower urinary tract symptoms: Secondary | ICD-10-CM | POA: Diagnosis not present

## 2018-10-23 DIAGNOSIS — Z87891 Personal history of nicotine dependence: Secondary | ICD-10-CM | POA: Diagnosis not present

## 2018-10-23 DIAGNOSIS — Z8673 Personal history of transient ischemic attack (TIA), and cerebral infarction without residual deficits: Secondary | ICD-10-CM | POA: Diagnosis not present

## 2018-10-23 DIAGNOSIS — R0600 Dyspnea, unspecified: Secondary | ICD-10-CM

## 2018-10-23 DIAGNOSIS — Z8546 Personal history of malignant neoplasm of prostate: Secondary | ICD-10-CM | POA: Diagnosis not present

## 2018-10-23 DIAGNOSIS — J449 Chronic obstructive pulmonary disease, unspecified: Secondary | ICD-10-CM

## 2018-10-23 DIAGNOSIS — Z79899 Other long term (current) drug therapy: Secondary | ICD-10-CM | POA: Diagnosis not present

## 2018-10-23 DIAGNOSIS — K219 Gastro-esophageal reflux disease without esophagitis: Secondary | ICD-10-CM | POA: Diagnosis not present

## 2018-10-23 DIAGNOSIS — H9190 Unspecified hearing loss, unspecified ear: Secondary | ICD-10-CM | POA: Diagnosis not present

## 2018-10-23 DIAGNOSIS — E785 Hyperlipidemia, unspecified: Secondary | ICD-10-CM | POA: Diagnosis not present

## 2018-10-23 DIAGNOSIS — R0609 Other forms of dyspnea: Principal | ICD-10-CM

## 2018-10-23 DIAGNOSIS — I480 Paroxysmal atrial fibrillation: Secondary | ICD-10-CM | POA: Diagnosis not present

## 2018-10-23 DIAGNOSIS — I2510 Atherosclerotic heart disease of native coronary artery without angina pectoris: Secondary | ICD-10-CM | POA: Diagnosis not present

## 2018-10-23 DIAGNOSIS — Z7901 Long term (current) use of anticoagulants: Secondary | ICD-10-CM | POA: Diagnosis not present

## 2018-10-23 DIAGNOSIS — Z7951 Long term (current) use of inhaled steroids: Secondary | ICD-10-CM | POA: Diagnosis not present

## 2018-10-23 DIAGNOSIS — I10 Essential (primary) hypertension: Secondary | ICD-10-CM | POA: Diagnosis not present

## 2018-10-23 NOTE — Progress Notes (Signed)
Daily Session Note  Patient Details  Name: James [name] MRN: [id number] Date of Birth: 09-02-1929 Referring Provider:     Pulmonary Rehab from 09/24/2018 in [facility] Cardiac and Pulmonary Rehab  Referring Provider  Ashby Dawes      Encounter Date: 10/23/2018  Check In: Session Check In - 10/23/18 1040      Check-In   Supervising physician immediately available to respond to emergencies  [facility] immediately available ER MD    Physician(s)  Mariea Clonts and Alfred Levins    Location  [facility]-Cardiac & Pulmonary Rehab    Staff Present  Alberteen Sam, MA, RCEP, CCRP, Exercise Physiologist;Joseph Foy [place], [place], ACSM CEP, Exercise Physiologist    Medication changes reported      No    Fall or balance concerns reported     No    Warm-up and Cool-down  Performed as group-led instruction    Resistance Training Performed  Yes    VAD Patient?  No    PAD/SET Patient?  No      Pain Assessment   Currently in Pain?  No/denies    Multiple Pain Sites  No          Social History   Tobacco Use  Smoking Status Former Smoker  . Packs/day: 1.00  . Years: 11.00  . Pack years: 11.00  . Types: Cigarettes  . Last attempt to quit: 02/12/1984  . Years since quitting: 34.7  Smokeless Tobacco Never Used  Tobacco Comment   quit smoking 30 yrs ago    Goals Met:  Independence with exercise equipment Exercise tolerated well No report of cardiac concerns or symptoms Strength training completed today  Goals Unmet:  Not Applicable  Comments: Reviewed home exercise with pt today.  Pt plans to walk and join [employer] for exercise.  Reviewed THR, pulse, RPE, sign and symptoms, NTG use, and when to call 911 or MD.  Also discussed weather considerations and indoor options.  Pt voiced understanding.    Dr. Emily Filbert is Medical Director for [place] and [facility].

## 2018-10-25 DIAGNOSIS — I10 Essential (primary) hypertension: Secondary | ICD-10-CM | POA: Diagnosis not present

## 2018-10-25 DIAGNOSIS — N400 Enlarged prostate without lower urinary tract symptoms: Secondary | ICD-10-CM | POA: Diagnosis not present

## 2018-10-25 DIAGNOSIS — R0609 Other forms of dyspnea: Principal | ICD-10-CM

## 2018-10-25 DIAGNOSIS — I2510 Atherosclerotic heart disease of native coronary artery without angina pectoris: Secondary | ICD-10-CM | POA: Diagnosis not present

## 2018-10-25 DIAGNOSIS — Z7951 Long term (current) use of inhaled steroids: Secondary | ICD-10-CM | POA: Diagnosis not present

## 2018-10-25 DIAGNOSIS — Z79899 Other long term (current) drug therapy: Secondary | ICD-10-CM | POA: Diagnosis not present

## 2018-10-25 DIAGNOSIS — E785 Hyperlipidemia, unspecified: Secondary | ICD-10-CM | POA: Diagnosis not present

## 2018-10-25 DIAGNOSIS — I480 Paroxysmal atrial fibrillation: Secondary | ICD-10-CM | POA: Diagnosis not present

## 2018-10-25 DIAGNOSIS — Z8673 Personal history of transient ischemic attack (TIA), and cerebral infarction without residual deficits: Secondary | ICD-10-CM | POA: Diagnosis not present

## 2018-10-25 DIAGNOSIS — K219 Gastro-esophageal reflux disease without esophagitis: Secondary | ICD-10-CM | POA: Diagnosis not present

## 2018-10-25 DIAGNOSIS — H9190 Unspecified hearing loss, unspecified ear: Secondary | ICD-10-CM | POA: Diagnosis not present

## 2018-10-25 DIAGNOSIS — J449 Chronic obstructive pulmonary disease, unspecified: Secondary | ICD-10-CM | POA: Diagnosis not present

## 2018-10-25 DIAGNOSIS — M1990 Unspecified osteoarthritis, unspecified site: Secondary | ICD-10-CM | POA: Diagnosis not present

## 2018-10-25 DIAGNOSIS — Z87891 Personal history of nicotine dependence: Secondary | ICD-10-CM | POA: Diagnosis not present

## 2018-10-25 DIAGNOSIS — Z8546 Personal history of malignant neoplasm of prostate: Secondary | ICD-10-CM | POA: Diagnosis not present

## 2018-10-25 DIAGNOSIS — R0600 Dyspnea, unspecified: Secondary | ICD-10-CM

## 2018-10-25 DIAGNOSIS — Z7901 Long term (current) use of anticoagulants: Secondary | ICD-10-CM | POA: Diagnosis not present

## 2018-10-25 NOTE — Progress Notes (Signed)
Daily Session Note  Patient Details  Name: James [name] MRN: [id number] Date of Birth: 1929-07-07 Referring Provider:     Pulmonary Rehab from 09/24/2018 in [facility] Cardiac and Pulmonary Rehab  Referring Provider  Ashby Dawes      Encounter Date: 10/25/2018  Check In: Session Check In - 10/25/18 0956      Check-In   Supervising physician immediately available to respond to emergencies  [facility] immediately available ER MD    Physician(s)  Dr. Cinda Quest and Corky Downs    Location  [facility]-Cardiac & Pulmonary Rehab    Staff Present  Renita Papa, RN BSN;Joseph Hood RCP,RRT,BSRT;Amanda Oletta Darter, [place], ACSM CEP, Exercise Physiologist    Medication changes reported      No    Fall or balance concerns reported     No    Warm-up and Cool-down  Performed as group-led instruction    Resistance Training Performed  Yes    VAD Patient?  No    PAD/SET Patient?  No      Pain Assessment   Currently in Pain?  No/denies          Social History   Tobacco Use  Smoking Status Former Smoker  . Packs/day: 1.00  . Years: 11.00  . Pack years: 11.00  . Types: Cigarettes  . Last attempt to quit: 02/12/1984  . Years since quitting: 34.7  Smokeless Tobacco Never Used  Tobacco Comment   quit smoking 30 yrs ago    Goals Met:  Independence with exercise equipment Exercise tolerated well Queuing for purse lip breathing No report of cardiac concerns or symptoms Strength training completed today  Goals Unmet:  Not Applicable  Comments: Pt able to follow exercise prescription today without complaint.  Will continue to monitor for progression.    Dr. Emily Filbert is Medical Director for [place] and [facility].

## 2018-10-27 ENCOUNTER — Other Ambulatory Visit: Payer: Self-pay | Admitting: Family Medicine

## 2018-10-30 DIAGNOSIS — J449 Chronic obstructive pulmonary disease, unspecified: Secondary | ICD-10-CM | POA: Diagnosis not present

## 2018-10-30 DIAGNOSIS — M1990 Unspecified osteoarthritis, unspecified site: Secondary | ICD-10-CM | POA: Diagnosis not present

## 2018-10-30 DIAGNOSIS — K219 Gastro-esophageal reflux disease without esophagitis: Secondary | ICD-10-CM | POA: Diagnosis not present

## 2018-10-30 DIAGNOSIS — Z7951 Long term (current) use of inhaled steroids: Secondary | ICD-10-CM | POA: Diagnosis not present

## 2018-10-30 DIAGNOSIS — H9190 Unspecified hearing loss, unspecified ear: Secondary | ICD-10-CM | POA: Diagnosis not present

## 2018-10-30 DIAGNOSIS — I10 Essential (primary) hypertension: Secondary | ICD-10-CM | POA: Diagnosis not present

## 2018-10-30 DIAGNOSIS — Z8546 Personal history of malignant neoplasm of prostate: Secondary | ICD-10-CM | POA: Diagnosis not present

## 2018-10-30 DIAGNOSIS — R0600 Dyspnea, unspecified: Secondary | ICD-10-CM

## 2018-10-30 DIAGNOSIS — Z87891 Personal history of nicotine dependence: Secondary | ICD-10-CM | POA: Diagnosis not present

## 2018-10-30 DIAGNOSIS — R0609 Other forms of dyspnea: Principal | ICD-10-CM

## 2018-10-30 DIAGNOSIS — Z7901 Long term (current) use of anticoagulants: Secondary | ICD-10-CM | POA: Diagnosis not present

## 2018-10-30 DIAGNOSIS — I2510 Atherosclerotic heart disease of native coronary artery without angina pectoris: Secondary | ICD-10-CM | POA: Diagnosis not present

## 2018-10-30 DIAGNOSIS — Z79899 Other long term (current) drug therapy: Secondary | ICD-10-CM | POA: Diagnosis not present

## 2018-10-30 DIAGNOSIS — I480 Paroxysmal atrial fibrillation: Secondary | ICD-10-CM | POA: Diagnosis not present

## 2018-10-30 DIAGNOSIS — Z8673 Personal history of transient ischemic attack (TIA), and cerebral infarction without residual deficits: Secondary | ICD-10-CM | POA: Diagnosis not present

## 2018-10-30 DIAGNOSIS — N400 Enlarged prostate without lower urinary tract symptoms: Secondary | ICD-10-CM | POA: Diagnosis not present

## 2018-10-30 DIAGNOSIS — E785 Hyperlipidemia, unspecified: Secondary | ICD-10-CM | POA: Diagnosis not present

## 2018-10-30 NOTE — Progress Notes (Signed)
Daily Session Note  Patient Details  Name: James [name] MRN: [id number] Date of Birth: 1928/12/25 Referring Provider:     Pulmonary Rehab from 09/24/2018 in [facility]  Referring Provider  Ashby Dawes      Encounter Date: 10/30/2018  Check In: Session Check In - 10/30/18 1011      Check-In   Supervising physician immediately available to respond to emergencies  [facility] immediately available ER MD    Physician(s)  Reita Cliche and Darl Householder    Location  [facility]    Staff Present  Alberteen Sam, MA, RCEP, CCRP, Exercise Physiologist;Joseph Foy [place], [place], ACSM CEP, Exercise Physiologist    Medication changes reported      No    Fall or balance concerns reported     No    Warm-up and Cool-down  Performed as group-led instruction    Resistance Training Performed  Yes    VAD Patient?  No    PAD/SET Patient?  No      Pain Assessment   Currently in Pain?  No/denies    Multiple Pain Sites  No          Social History   Tobacco Use  Smoking Status Former Smoker  . Packs/day: 1.00  . Years: 11.00  . Pack years: 11.00  . Types: Cigarettes  . Last attempt to quit: 02/12/1984  . Years since quitting: 34.7  Smokeless Tobacco Never Used  Tobacco Comment   quit smoking 30 yrs ago    Goals Met:  Proper associated with RPD/PD & O2 Sat Independence with exercise equipment Exercise tolerated well Strength training completed today  Goals Unmet:  Not Applicable  Comments: Pt able to follow exercise prescription today without complaint.  Will continue to monitor for progression.    Dr. Emily Filbert is Medical Director for [facility] and [facility].

## 2018-11-01 DIAGNOSIS — Z8673 Personal history of transient ischemic attack (TIA), and cerebral infarction without residual deficits: Secondary | ICD-10-CM | POA: Diagnosis not present

## 2018-11-01 DIAGNOSIS — Z7901 Long term (current) use of anticoagulants: Secondary | ICD-10-CM | POA: Diagnosis not present

## 2018-11-01 DIAGNOSIS — I10 Essential (primary) hypertension: Secondary | ICD-10-CM | POA: Diagnosis not present

## 2018-11-01 DIAGNOSIS — R0609 Other forms of dyspnea: Secondary | ICD-10-CM | POA: Diagnosis not present

## 2018-11-01 DIAGNOSIS — K219 Gastro-esophageal reflux disease without esophagitis: Secondary | ICD-10-CM | POA: Diagnosis not present

## 2018-11-01 DIAGNOSIS — R0600 Dyspnea, unspecified: Secondary | ICD-10-CM

## 2018-11-01 DIAGNOSIS — E785 Hyperlipidemia, unspecified: Secondary | ICD-10-CM | POA: Diagnosis not present

## 2018-11-01 DIAGNOSIS — H9190 Unspecified hearing loss, unspecified ear: Secondary | ICD-10-CM | POA: Diagnosis not present

## 2018-11-01 DIAGNOSIS — Z87891 Personal history of nicotine dependence: Secondary | ICD-10-CM | POA: Diagnosis not present

## 2018-11-01 DIAGNOSIS — I2510 Atherosclerotic heart disease of native coronary artery without angina pectoris: Secondary | ICD-10-CM | POA: Diagnosis not present

## 2018-11-01 DIAGNOSIS — Z7951 Long term (current) use of inhaled steroids: Secondary | ICD-10-CM | POA: Diagnosis not present

## 2018-11-01 DIAGNOSIS — M1990 Unspecified osteoarthritis, unspecified site: Secondary | ICD-10-CM | POA: Diagnosis not present

## 2018-11-01 DIAGNOSIS — J449 Chronic obstructive pulmonary disease, unspecified: Secondary | ICD-10-CM | POA: Diagnosis not present

## 2018-11-01 DIAGNOSIS — I480 Paroxysmal atrial fibrillation: Secondary | ICD-10-CM | POA: Diagnosis not present

## 2018-11-01 DIAGNOSIS — N400 Enlarged prostate without lower urinary tract symptoms: Secondary | ICD-10-CM | POA: Diagnosis not present

## 2018-11-01 DIAGNOSIS — Z8546 Personal history of malignant neoplasm of prostate: Secondary | ICD-10-CM | POA: Diagnosis not present

## 2018-11-01 DIAGNOSIS — Z79899 Other long term (current) drug therapy: Secondary | ICD-10-CM | POA: Diagnosis not present

## 2018-11-01 NOTE — Progress Notes (Signed)
Daily Session Note  Patient Details  Name: James [name] MRN: [id number] Date of Birth: [birth year] Referring Provider:     Pulmonary Rehab from 09/24/2018 in [facility]  Referring Provider  Ashby Dawes      Encounter Date: 11/01/2018  Check In: Session Check In - 11/01/18 0956      Check-In   Supervising physician immediately available to respond to emergencies  [facility] immediately available ER MD    Physician(s)  Dr. Reita Cliche and [facility]    Location  [facility]    Staff Present  Justin Mend RCP,RRT,BSRT;Amanda Oletta [place], [place], ACSM CEP, Exercise Physiologist;Meredith Sherryll Burger, RN BSN    Medication changes reported      No    Fall or balance concerns reported     No    Warm-up and Cool-down  Performed as group-led Higher education careers adviser Performed  Yes    VAD Patient?  No    PAD/SET Patient?  No      Pain Assessment   Currently in Pain?  No/denies          Social History   Tobacco Use  Smoking Status Former Smoker  . Packs/day: 1.00  . Years: 11.00  . Pack years: 11.00  . Types: Cigarettes  . Last attempt to quit: 02/12/1984  . Years since quitting: 34.7  Smokeless Tobacco Never Used  Tobacco Comment   quit smoking 30 yrs ago    Goals Met:  Independence with exercise equipment Exercise tolerated well No report of cardiac concerns or symptoms Strength training completed today  Goals Unmet:  Not Applicable  Comments: Pt able to follow exercise prescription today without complaint.  Will continue to monitor for progression.    Dr. Emily Filbert is Medical Director for [facility] and [facility].

## 2018-11-04 DIAGNOSIS — R0609 Other forms of dyspnea: Principal | ICD-10-CM

## 2018-11-04 DIAGNOSIS — R0600 Dyspnea, unspecified: Secondary | ICD-10-CM

## 2018-11-04 NOTE — Progress Notes (Signed)
Pulmonary Individual Treatment Plan  Patient Details  Name: James [name] MRN: [id number] Date of Birth: 1929/09/22 Referring Provider:     Pulmonary Rehab from 09/24/2018 in [facility]  Referring Provider  Ramachandran      Initial Encounter Date:    Pulmonary Rehab from 09/24/2018 in [facility] Cardiac and Pulmonary Rehab  Date  09/24/18      Visit Diagnosis: DOE (dyspnea on exertion)  Patient's Home Medications on Admission:  Current Outpatient Medications:  .  albuterol (PROAIR HFA) 108 (90 Base) MCG/ACT inhaler, INHALE 2 PUFFS INTO LUNGS EVERY 6 HOURS AS NEEDED FOR WHEEZING OR SHORTNESS OF BREATH, Disp: 8.5 g, Rfl: 5 .  atorvastatin (LIPITOR) 80 MG tablet, TAKE 1 TABLET BY MOUTH DAILY, Disp: 90 tablet, Rfl: 4 .  azelastine (OPTIVAR) 0.05 % ophthalmic solution, APPLY 1 DROP TO EYE TWICE DAILY, Disp: 6 mL, Rfl: 5 .  benazepril-hydrochlorthiazide (LOTENSIN HCT) 20-12.5 MG tablet, TAKE ONE TABLET BY MOUTH EVERY DAY, Disp: 90 tablet, Rfl: 4 .  beta carotene w/minerals (OCUVITE) tablet, Take 1 tablet by mouth daily., Disp: , Rfl:  .  chlorpheniramine-HYDROcodone (TUSSIONEX) 10-8 MG/5ML SUER, TAKE ONE TEASPOONFUL (5ML) EVERY TWELVE HOURS IF NEEDED FOR COUGH, Disp: 115 mL, Rfl: 0 .  ELIQUIS 5 MG TABS tablet, TAKE ONE TABLET BY MOUTH TWICE DAILY, Disp: 60 tablet, Rfl: 11 .  famotidine (PEPCID) 20 MG tablet, , Disp: , Rfl: 1 .  fluticasone (FLONASE) 50 MCG/ACT nasal spray, USE 2 SPRAYS IN EACH NOSTRIL DAILY, Disp: 48 g, Rfl: 3 .  Fluticasone-Umeclidin-Vilant (TRELEGY ELLIPTA) 100-62.5-25 MCG/INH AEPB, Inhale 1 applicator into the lungs daily. Rinse mouth after use. Stop symbicort and spiriva when taking this medicine., Disp: 1 each, Rfl: 10 .  HYDROcodone-acetaminophen (NORCO) 10-325 MG tablet, TAKE 1 TABLET BY MOUTH EVERY 6 HOURS AS NEEDED, Disp: 30 tablet, Rfl: 0 .  ipratropium (ATROVENT) 0.03 % nasal spray, , Disp: , Rfl: 1 .  lansoprazole (PREVACID) 30 MG capsule, TAKE 1  CAPSULE BY MOUTH EVERY DAY, Disp: 30 capsule, Rfl: 12 .  LORazepam (ATIVAN) 1 MG tablet, 1/2 TO 1 TABLET BY MOUTH AT BEDTIME AS NEEDED FOR SLEEP, Disp: 30 tablet, Rfl: 5 .  Magnesium 400 MG CAPS, Take 1 capsule by mouth daily. , Disp: , Rfl:  .  metoprolol tartrate (LOPRESSOR) 50 MG tablet, TAKE ONE TABLET BY MOUTH TWICE DAILY, Disp: 180 tablet, Rfl: 4 .  montelukast (SINGULAIR) 10 MG tablet, TAKE ONE TABLET EVERY DAY, Disp: 30 tablet, Rfl: 12 .  MULTIPLE VITAMIN PO, Take 1 tablet by mouth daily. , Disp: , Rfl:  .  Omega-3 Fatty Acids (FISH OIL) 1000 MG CAPS, Take 1 capsule daily by mouth., Disp: , Rfl:  .  SPIRIVA HANDIHALER 18 MCG inhalation capsule, INHALE THE CONTENTS OF ONE CAPSULE AS DIRECTED ONCE DAILY (Patient not taking: Reported on 10/10/2018), Disp: 30 capsule, Rfl: 12 .  SYMBICORT 160-4.5 MCG/ACT inhaler, TAKE 2 PUFFS INTO LUNGS TWICE A DAY (Patient not taking: Reported on 10/10/2018), Disp: 10.2 g, Rfl: 12 .  tamsulosin (FLOMAX) 0.4 MG CAPS capsule, TAKE 1 CAPSULE BY MOUTH DAILY, Disp: 90 capsule, Rfl: 3  Past Medical History: Past Medical History:  Diagnosis Date  . Arthritis   . Back pain   . CAD (coronary artery disease)   . Cataract    right  . COPD (chronic obstructive pulmonary disease) ([place])    SPiriva and SYmbicort daily. Albuterol as needed  . Diverticulosis   . Dyspnea  with exertion  . Enlarged prostate    takes Flomax daily  . GERD (gastroesophageal reflux disease)   . History of chicken pox   . History of gout   . History of measles   . History of mumps   . HOH (hard of hearing)   . Hyperglycemia   . Hyperlipidemia    takes Atorvastatin daily  . Hypertension    takes Metoprolol daily as well as Lotensin HCT  . Hypotension   . Joint pain   . Microscopic colitis   . PAF (paroxysmal atrial fibrillation) ([place]), RVR 03/14/2017  . Pneumonia   . Prostate cancer ([place])   . Stroke [facility])    TIA  . TIA (transient ischemic attack)   . Weakness    numbness  and tingling.mainly on right    Tobacco Use: Social History   Tobacco Use  Smoking Status Former Smoker  . Packs/day: 1.00  . Years: 11.00  . Pack years: 11.00  . Types: Cigarettes  . Last attempt to quit: 02/12/1984  . Years since quitting: 34.7  Smokeless Tobacco Never Used  Tobacco Comment   quit smoking 30 yrs ago    Labs: Recent Review Flowsheet Data    Labs for ITP Cardiac and Pulmonary Rehab Latest Ref Rng & Units 10/05/2014 10/07/2014 10/31/2017   Cholestrol 0 - 200 mg/dL - 104 106   LDLCALC 0 - 99 mg/dL - 40 45   HDL >40 mg/dL - 52 52   Trlycerides <150 mg/dL - 61 47   Hemoglobin A1c 4.8 - 5.6 % 5.7 5.7 6.1(H)       Pulmonary Assessment Scores: Pulmonary Assessment Scores    Row Name 09/24/18 1132         ADL UCSD   ADL Phase  Entry     SOB Score total  36     Rest  0     Walk  4     Stairs  4     Bath  1     Dress  1     Shop  1       CAT Score   CAT Score  11       mMRC Score   mMRC Score  2        Pulmonary Function Assessment: Pulmonary Function Assessment - 09/24/18 1202      Breath   Bilateral Breath Sounds  Clear    Shortness of Breath  Yes;Limiting activity       Exercise Target Goals: Exercise Program Goal: Individual exercise prescription set using results from initial 6 min walk test and THRR while considering  patient's activity barriers and safety.   Exercise Prescription Goal: Initial exercise prescription builds to 30-45 minutes a day of aerobic activity, 2-3 days per week.  Home exercise guidelines will be given to patient during program as part of exercise prescription that the participant will acknowledge.  Activity Barriers & Risk Stratification:   6 Minute Walk: 6 Minute Walk    Row Name 09/24/18 1222         6 Minute Walk   Phase  Initial     Distance  900 feet     Walk Time  6 minutes     # of Rest Breaks  0     MPH  1.7     METS  0.93     RPE  13     Perceived Dyspnea   1     VO2 Peak  3.29      Symptoms  No     Resting HR  53 bpm     Resting BP  106/56     Resting Oxygen Saturation   95 %     Exercise Oxygen Saturation  during 6 min walk  91 %     Max Ex. HR  89 bpm     Max Ex. BP  136/64     2 Minute Post BP  110/56       Interval HR   1 Minute HR  75     2 Minute HR  76     3 Minute HR  80     4 Minute HR  83     5 Minute HR  89     6 Minute HR  74     2 Minute Post HR  77     Interval Heart Rate?  Yes       Interval Oxygen   Interval Oxygen?  Yes     Baseline Oxygen Saturation %  95 %     1 Minute Oxygen Saturation %  92 %     1 Minute Liters of Oxygen  0 L     2 Minute Oxygen Saturation %  91 %     2 Minute Liters of Oxygen  0 L     3 Minute Oxygen Saturation %  92 %     3 Minute Liters of Oxygen  0 L     4 Minute Oxygen Saturation %  91 %     4 Minute Liters of Oxygen  0 L     5 Minute Oxygen Saturation %  92 %     5 Minute Liters of Oxygen  0 L     6 Minute Oxygen Saturation %  92 %     6 Minute Liters of Oxygen  0 L     2 Minute Post Oxygen Saturation %  94 %     2 Minute Post Liters of Oxygen  0 L       Oxygen Initial Assessment: Oxygen Initial Assessment - 09/24/18 1202      Home Oxygen   Home Oxygen Device  None    Sleep Oxygen Prescription  None    Home Exercise Oxygen Prescription  None    Home at Rest Exercise Oxygen Prescription  None      Initial 6 min Walk   Oxygen Used  None      Program Oxygen Prescription   Program Oxygen Prescription  None      Intervention   Short Term Goals  To learn and demonstrate proper pursed lip breathing techniques or other breathing techniques.;To learn and understand importance of maintaining oxygen saturations>88%;To learn and understand importance of monitoring SPO2 with pulse oximeter and demonstrate accurate use of the pulse oximeter.;To learn and demonstrate proper use of respiratory medications    Long  Term Goals  Verbalizes importance of monitoring SPO2 with pulse oximeter and return  demonstration;Maintenance of O2 saturations>88%;Exhibits proper breathing techniques, such as pursed lip breathing or other method taught during program session;Compliance with respiratory medication;Demonstrates proper use of MDI's       Oxygen Re-Evaluation: Oxygen Re-Evaluation    Row Name 09/30/18 1014 10/23/18 1425           Program Oxygen Prescription   Program Oxygen Prescription  None  None        Home Oxygen   Home Oxygen  Device  None  None      Sleep Oxygen Prescription  None  None      Home Exercise Oxygen Prescription  None  None      Home at Rest Exercise Oxygen Prescription  None  None        Goals/Expected Outcomes   Short Term Goals  To learn and demonstrate proper pursed lip breathing techniques or other breathing techniques.;To learn and understand importance of maintaining oxygen saturations>88%;To learn and understand importance of monitoring SPO2 with pulse oximeter and demonstrate accurate use of the pulse oximeter.;To learn and demonstrate proper use of respiratory medications  To learn and demonstrate proper pursed lip breathing techniques or other breathing techniques.;To learn and understand importance of maintaining oxygen saturations>88%;To learn and understand importance of monitoring SPO2 with pulse oximeter and demonstrate accurate use of the pulse oximeter.;To learn and demonstrate proper use of respiratory medications      Long  Term Goals  Verbalizes importance of monitoring SPO2 with pulse oximeter and return demonstration;Maintenance of O2 saturations>88%;Exhibits proper breathing techniques, such as pursed lip breathing or other method taught during program session;Compliance with respiratory medication;Demonstrates proper use of MDI's  Verbalizes importance of monitoring SPO2 with pulse oximeter and return demonstration;Maintenance of O2 saturations>88%;Exhibits proper breathing techniques, such as pursed lip breathing or other method taught during  program session;Compliance with respiratory medication;Demonstrates proper use of MDI's      Comments  Reviewed PLB technique with pt.  Talked about how it work and it's important to maintaining his exercise saturations.    Patient has been trying to practice with his PLB. He takes his inhalers regularly and has no questions on his medications. He states that he has a spacer and have reviewed with patient how to use it. He wears no oxygen at home or CPAP when he sleeps.      Goals/Expected Outcomes  Short: Become more profiecient at using PLB.   Long: Become independent at using PLB.  Short: check oxygen at home when on exertion. Long: maintain oxygen saturations above 88 percent independently.         Oxygen Discharge (Final Oxygen Re-Evaluation): Oxygen Re-Evaluation - 10/23/18 1425      Program Oxygen Prescription   Program Oxygen Prescription  None      Home Oxygen   Home Oxygen Device  None    Sleep Oxygen Prescription  None    Home Exercise Oxygen Prescription  None    Home at Rest Exercise Oxygen Prescription  None      Goals/Expected Outcomes   Short Term Goals  To learn and demonstrate proper pursed lip breathing techniques or other breathing techniques.;To learn and understand importance of maintaining oxygen saturations>88%;To learn and understand importance of monitoring SPO2 with pulse oximeter and demonstrate accurate use of the pulse oximeter.;To learn and demonstrate proper use of respiratory medications    Long  Term Goals  Verbalizes importance of monitoring SPO2 with pulse oximeter and return demonstration;Maintenance of O2 saturations>88%;Exhibits proper breathing techniques, such as pursed lip breathing or other method taught during program session;Compliance with respiratory medication;Demonstrates proper use of MDI's    Comments  Patient has been trying to practice with his PLB. He takes his inhalers regularly and has no questions on his medications. He states that he has  a spacer and have reviewed with patient how to use it. He wears no oxygen at home or CPAP when he sleeps.    Goals/Expected Outcomes  Short: check oxygen at home  when on exertion. Long: maintain oxygen saturations above 88 percent independently.       Initial Exercise Prescription: Initial Exercise Prescription - 09/24/18 1200      Date of Initial Exercise RX and Referring Provider   Date  09/24/18    Referring Provider  Ramachandran      Treadmill   MPH  1    Grade  0    Minutes  15    METs  1.7      NuStep   Level  1    SPM  80    Minutes  15    METs  1.5      Biostep-RELP   Level  2    SPM  50    Minutes  15    METs  1.5      Prescription Details   Frequency (times per week)  3    Duration  Progress to 45 minutes of aerobic exercise without signs/symptoms of physical distress      Intensity   THRR 40-80% of Max Heartrate  85-117    Ratings of Perceived Exertion  11-15    Perceived Dyspnea  0-4      Resistance Training   Training Prescription  Yes    Weight  3 lb    Reps  10-15       Perform Capillary Blood Glucose checks as needed.  Exercise Prescription Changes: Exercise Prescription Changes    Row Name 10/09/18 1200 10/23/18 1200           Response to Exercise   Blood Pressure (Admit)  110/60  148/84      Blood Pressure (Exit)  124/70  126/60      Heart Rate (Admit)  66 bpm  82 bpm      Heart Rate (Exercise)  85 bpm  95 bpm      Heart Rate (Exit)  63 bpm  66 bpm      Oxygen Saturation (Admit)  96 %  93 %      Oxygen Saturation (Exercise)  93 %  96 %      Oxygen Saturation (Exit)  97 %  97 %      Rating of Perceived Exertion (Exercise)  12  12      Perceived Dyspnea (Exercise)  3  3      Symptoms  none  none      Duration  Continue with 45 min of aerobic exercise without signs/symptoms of physical distress.  Continue with 45 min of aerobic exercise without signs/symptoms of physical distress.      Intensity  THRR unchanged  THRR unchanged          Progression   Progression  Continue to progress workloads to maintain intensity without signs/symptoms of physical distress.  Continue to progress workloads to maintain intensity without signs/symptoms of physical distress.      Average METs  2  2        Resistance Training   Training Prescription  Yes  Yes      Weight  3 lb  3 lb      Reps  10-15  10-15        Interval Training   Interval Training  No  No        Treadmill   MPH  1  1      Grade  0  0      Minutes  15  15  METs  1.77  1.77        Biostep-RELP   Level  2  2      SPM  50  50      Minutes  15  15      METs  2  2        Home Exercise Plan   Plans to continue exercise at  [facility]      Frequency  -  Add 1 additional day to program exercise sessions.      Initial Home Exercises Provided  -  10/23/18         Exercise Comments: Exercise Comments    Row Name 09/30/18 1013 10/23/18 1042         Exercise Comments   First full day of exercise!  Patient was oriented to gym and equipment including functions, settings, policies, and procedures.  Patient's individual exercise prescription and treatment plan were reviewed.  All starting workloads were established based on the results of the 6 minute walk test done at initial orientation visit.  The plan for exercise progression was also introduced and progression will be customized based on patient's performance and goals.   Reviewed home exercise with pt today.  Pt plans to walk and join [employer] for exercise.  Reviewed THR, pulse, RPE, sign and symptoms, NTG use, and when to call 911 or MD.  Also discussed weather considerations and indoor options.  Pt voiced understanding.         Exercise Goals and Review: Exercise Goals    Row Name 09/24/18 1221             Exercise Goals   Increase Physical Activity  Yes       Intervention  Provide advice, education, support and counseling about physical activity/exercise needs.;Develop an individualized  exercise prescription for aerobic and resistive training based on initial evaluation findings, risk stratification, comorbidities and participant's personal goals.       Expected Outcomes  Short Term: Attend rehab on a regular basis to increase amount of physical activity.;Long Term: Add in home exercise to make exercise part of routine and to increase amount of physical activity.;Long Term: Exercising regularly at least 3-5 days a week.       Increase Strength and Stamina  Yes       Intervention  Provide advice, education, support and counseling about physical activity/exercise needs.;Develop an individualized exercise prescription for aerobic and resistive training based on initial evaluation findings, risk stratification, comorbidities and participant's personal goals.       Expected Outcomes  Short Term: Increase workloads from initial exercise prescription for resistance, speed, and METs.;Short Term: Perform resistance training exercises routinely during rehab and add in resistance training at home;Long Term: Improve cardiorespiratory fitness, muscular endurance and strength as measured by increased METs and functional capacity (6MWT)       Able to understand and use rate of perceived exertion (RPE) scale  Yes       Intervention  Provide education and explanation on how to use RPE scale       Expected Outcomes  Short Term: Able to use RPE daily in rehab to express subjective intensity level;Long Term:  Able to use RPE to guide intensity level when exercising independently       Able to understand and use Dyspnea scale  Yes       Intervention  Provide education and explanation on how to use Dyspnea scale  Expected Outcomes  Short Term: Able to use Dyspnea scale daily in rehab to express subjective sense of shortness of breath during exertion;Long Term: Able to use Dyspnea scale to guide intensity level when exercising independently       Knowledge and understanding of Target Heart Rate Range  (THRR)  Yes       Intervention  Provide education and explanation of THRR including how the numbers were predicted and where they are located for reference       Expected Outcomes  Short Term: Able to state/look up THRR;Short Term: Able to use daily as guideline for intensity in rehab;Long Term: Able to use THRR to govern intensity when exercising independently       Able to check pulse independently  Yes       Intervention  Provide education and demonstration on how to check pulse in carotid and radial arteries.;Review the importance of being able to check your own pulse for safety during independent exercise       Expected Outcomes  Long Term: Able to check pulse independently and accurately;Short Term: Able to explain why pulse checking is important during independent exercise       Understanding of Exercise Prescription  Yes       Intervention  Provide education, explanation, and written materials on patient's individual exercise prescription       Expected Outcomes  Short Term: Able to explain program exercise prescription;Long Term: Able to explain home exercise prescription to exercise independently          Exercise Goals Re-Evaluation : Exercise Goals Re-Evaluation    Row Name 09/30/18 1013 10/09/18 1238 10/23/18 1042         Exercise Goal Re-Evaluation   Exercise Goals Review  Increase Physical Activity;Increase Strength and Stamina;Able to understand and use rate of perceived exertion (RPE) scale;Able to understand and use Dyspnea scale;Knowledge and understanding of Target Heart Rate Range (THRR);Understanding of Exercise Prescription  Increase Physical Activity;Increase Strength and Stamina;Able to understand and use rate of perceived exertion (RPE) scale;Able to understand and use Dyspnea scale;Knowledge and understanding of Target Heart Rate Range (THRR)  Increase Physical Activity;Able to understand and use rate of perceived exertion (RPE) scale;Knowledge and understanding of  Target Heart Rate Range (THRR);Understanding of Exercise Prescription;Increase Strength and Stamina;Able to understand and use Dyspnea scale;Able to check pulse independently     Comments  Reviewed RPE scale, THR and program prescription with pt today.  Pt voiced understanding and was given a copy of goals to take home.  James [name] is in his second full week of exercise.  He is tolerating exercise well.  Staff will monitor progress.   Reviewed home exercise with pt today.  Pt plans to walk and join [employer] for exercise.  Reviewed THR, pulse, RPE, sign and symptoms, NTG use, and when to call 911 or MD.  Also discussed weather considerations and indoor options.  Pt voiced understanding.     Expected Outcomes  Short: Use RPE daily to regulate intensity. Long: Follow program prescription in THR.  Short - attend 3 days per week Long - increase overall MET level  Short - add one day per week at home walking Long - join [employer] and maintain exercise         Discharge Exercise Prescription (Final Exercise Prescription Changes): Exercise Prescription Changes - 10/23/18 1200      Response to Exercise   Blood Pressure (Admit)  148/84    Blood Pressure (Exit)  126/60  Heart Rate (Admit)  82 bpm    Heart Rate (Exercise)  95 bpm    Heart Rate (Exit)  66 bpm    Oxygen Saturation (Admit)  93 %    Oxygen Saturation (Exercise)  96 %    Oxygen Saturation (Exit)  97 %    Rating of Perceived Exertion (Exercise)  12    Perceived Dyspnea (Exercise)  3    Symptoms  none    Duration  Continue with 45 min of aerobic exercise without signs/symptoms of physical distress.    Intensity  THRR unchanged      Progression   Progression  Continue to progress workloads to maintain intensity without signs/symptoms of physical distress.    Average METs  2      Resistance Training   Training Prescription  Yes    Weight  3 lb    Reps  10-15      Interval Training   Interval Training  No      Treadmill   MPH  1     Grade  0    Minutes  15    METs  1.77      Biostep-RELP   Level  2    SPM  50    Minutes  15    METs  2      Home Exercise Plan   Plans to continue exercise at  [employer]    Frequency  Add 1 additional day to program exercise sessions.    Initial Home Exercises Provided  10/23/18       Nutrition:  Target Goals: Understanding of nutrition guidelines, daily intake of sodium <1534m, cholesterol <2064m calories 30% from fat and 7% or less from saturated fats, daily to have 5 or more servings of fruits and vegetables.  Biometrics: Pre Biometrics - 09/24/18 1220      Pre Biometrics   Height  5' 4.5" (1.638 m)    Weight  186 lb 4.8 oz (84.5 kg)    Waist Circumference  42 inches    Hip Circumference  43 inches    Waist to Hip Ratio  0.98 %    BMI (Calculated)  31.5    Single Leg Stand  3.96 seconds        Nutrition Therapy Plan and Nutrition Goals: Nutrition Therapy & Goals - 10/07/18 1058      Nutrition Therapy   Diet  TLC    Drug/Food Interactions  Statins/Certain Fruits    Protein (specify units)  8oz    Fiber  30 grams    Whole Grain Foods  3 servings   prefers rye bread   Saturated Fats  14 max. grams    Fruits and Vegetables  6 servings/day   8 ideal; does not eat large portions but likes fruits and vegetables   Sodium  1500 grams      Personal Nutrition Goals   Nutrition Goal  Start to pay more attention to the amount of sodium you are consuming. For example, buy lower salt crackers or work with your wife to use less salt during the cooking process    Personal Goal #2  Have a glass of water near you at home to remind you to drink more during the day. Aim to drink one additional glass of water each day    Comments  He eats oatmeal for breakfast daily, 1/2 a sandwich for lunchy and has a larger dinner. He and his wife eat out for dinners half  of the time he estimates at restaurants nearby. He feels his downfall is the amount of snacking he does but is trying to  choose smaller portions of high-sugar options like ice cream. Does not eat much red meat as it is difficult for him to chew. He drinks mostly water and some orange juice. They buy canned items when certain fruits / vegetables are out of season. His wife currently cooks with salt especially for options like soup      Intervention Plan   Intervention  Prescribe, educate and counsel regarding individualized specific dietary modifications aiming towards targeted core components such as weight, hypertension, lipid management, diabetes, heart failure and other comorbidities.    Expected Outcomes  Long Term Goal: Adherence to prescribed nutrition plan.;Short Term Goal: A plan has been developed with personal nutrition goals set during dietitian appointment.;Short Term Goal: Understand basic principles of dietary content, such as calories, fat, sodium, cholesterol and nutrients.       Nutrition Assessments:   Nutrition Goals Re-Evaluation: Nutrition Goals Re-Evaluation    [place] Name 10/07/18 1120             Goals   Nutrition Goal  Start to pay more attention to the amount of sodium you are consuming. For example, buy lower salt crackers or work with your wife to use less salt during the cooking process       Comment  He does not monitor his sodium intake at this time. He either eats meals provided by his wife or they eat out as a couple       Expected Outcome  He will buy lower salt snack foods and/or not add salt to meals at home to help reduce overall sodium intake         Personal Goal #2 Re-Evaluation   Personal Goal #2  Have a glass of water near you at home to remind you to drink more during the day. Aim to drink one additional glass of water each day          Nutrition Goals Discharge (Final Nutrition Goals Re-Evaluation): Nutrition Goals Re-Evaluation - 10/07/18 1120      Goals   Nutrition Goal  Start to pay more attention to the amount of sodium you are consuming. For example, buy  lower salt crackers or work with your wife to use less salt during the cooking process    Comment  He does not monitor his sodium intake at this time. He either eats meals provided by his wife or they eat out as a couple    Expected Outcome  He will buy lower salt snack foods and/or not add salt to meals at home to help reduce overall sodium intake      Personal Goal #2 Re-Evaluation   Personal Goal #2  Have a glass of water near you at home to remind you to drink more during the day. Aim to drink one additional glass of water each day       Psychosocial: Target Goals: Acknowledge presence or absence of significant depression and/or stress, maximize coping skills, provide positive support system. Participant is able to verbalize types and ability to use techniques and skills needed for reducing stress and depression.   Initial Review & Psychosocial Screening: Initial Psych Review & Screening - 09/24/18 1203      Initial Review   Current issues with  None Identified      Family Dynamics   Good Support System?  Yes  Comments  His wife of 48 years is a good support system      Barriers   Psychosocial barriers to participate in program  There are no identifiable barriers or psychosocial needs.;The patient should benefit from training in stress management and relaxation.      Screening Interventions   Interventions  Encouraged to exercise    Expected Outcomes  Short Term goal: Utilizing psychosocial counselor, staff and physician to assist with identification of specific Stressors or current issues interfering with healing process. Setting desired goal for each stressor or current issue identified.;Long Term Goal: Stressors or current issues are controlled or eliminated.;Short Term goal: Identification and review with participant of any Quality of Life or Depression concerns found by scoring the questionnaire.;Long Term goal: The participant improves quality of Life and PHQ9 Scores as seen by  post scores and/or verbalization of changes       Quality of Life Scores:  Scores of 19 and below usually indicate a poorer quality of life in these areas.  A difference of  2-3 points is a clinically meaningful difference.  A difference of 2-3 points in the total score of the Quality of Life Index has been associated with significant improvement in overall quality of life, self-image, physical symptoms, and general health in studies assessing change in quality of life.  PHQ-9: Recent Review Flowsheet Data    Depression screen [facility] 2/9 09/24/2018 05/08/2018 02/13/2018 02/09/2017 10/24/2016   Decreased Interest 0 0 2 0 0   Down, Depressed, Hopeless 0 0 2 0 0   PHQ - 2 Score 0 0 4 0 0   Altered sleeping 0 - '2 2 1   ' Tired, decreased energy 1 - '2 1 2   ' Change in appetite 2 - 2 0 0   Feeling bad or failure about yourself  0 - 2 0 2    Trouble concentrating 0 - 0 0 1   Moving slowly or fidgety/restless 0 - 0 0 0   Suicidal thoughts 0 - 0 0 0   PHQ-9 Score 3 - '12 3 6   ' Difficult doing work/chores Not difficult at all - Very difficult Not difficult at all Somewhat difficult     Interpretation of Total Score  Total Score Depression Severity:  1-4 = Minimal depression, 5-9 = Mild depression, 10-14 = Moderate depression, 15-19 = Moderately severe depression, 20-27 = Severe depression   Psychosocial Evaluation and Intervention: Psychosocial Evaluation - 10/07/18 1059      Psychosocial Evaluation & Interventions   Interventions  Encouraged to exercise with the program and follow exercise prescription    Comments  Counselor met with Mr. Bartosiewicz Lofgren) today for an initial psychosocial evaluation.  He was in this program several years ago and has since had a bout with lung cancer and radiation earlier this year.  He continues to live with his wife of 64 years and is involved in his local church.  He reports being in fairly good health otherwise - but had difficulty hearing at times.  Jailyn reports sleeping  "okay" most of the time with the help of some medication, and he has a good appetite.  He denies a history of depression or anxiety or any current symptoms and reports being "happy" and in a good mood most of the time.  Jafeth has goals to improve his breathing while in this program and regain his energy subsequent to the radiation treatments.  Staff will follow with Amber throughout the course of this program.  Expected Outcomes  Short:  Clydell will exercise to regain his energy and to hopefully improve his ability to breathe better.   Long:  Allenmichael will continue to exercise for his health and his mental health.     Continue Psychosocial Services   Follow up required by staff       Psychosocial Re-Evaluation: Psychosocial Re-Evaluation    [place] Name 10/23/18 1430             Psychosocial Re-Evaluation   Current issues with  None Identified       Comments  Patient states he has no issues to report and has been doing well mentaly. He states everything has been good at home with is wife of 40 years.       Expected Outcomes  Short: Attend [facility] stress management education to decrease stress. Long: Maintain exercise Post [facility] to keep stress at a minimum.       Interventions  Encouraged to attend Pulmonary Rehabilitation for the exercise       Continue Psychosocial Services   Follow up required by staff          Psychosocial Discharge (Final Psychosocial Re-Evaluation): Psychosocial Re-Evaluation - 10/23/18 1430      Psychosocial Re-Evaluation   Current issues with  None Identified    Comments  Patient states he has no issues to report and has been doing well mentaly. He states everything has been good at home with is wife of 16 years.    Expected Outcomes  Short: Attend [facility] stress management education to decrease stress. Long: Maintain exercise Post [facility] to keep stress at a minimum.    Interventions  Encouraged to attend Pulmonary Rehabilitation for the exercise    Continue  Psychosocial Services   Follow up required by staff       Education: Education Goals: Education classes will be provided on a weekly basis, covering required topics. Participant will state understanding/return demonstration of topics presented.  Learning Barriers/Preferences: Learning Barriers/Preferences - 09/24/18 1121      Learning Barriers/Preferences   Learning Barriers  Hearing    Learning Preferences  None       Education Topics:  Initial Evaluation Education: - Verbal, written and demonstration of respiratory meds, oximetry and breathing techniques. Instruction on use of nebulizers and MDIs and importance of monitoring MDI activations.   Pulmonary Rehab from 11/01/2018 in [facility] Cardiac and Pulmonary Rehab  Date  09/24/18  Educator  [facility]  Instruction Review Code  1- Verbalizes Understanding      General Nutrition Guidelines/Fats and Fiber: -Group instruction provided by verbal, written material, models and posters to present the general guidelines for heart healthy nutrition. Gives an explanation and review of dietary fats and fiber.   Pulmonary Rehab from 11/01/2018 in [facility] Cardiac and Pulmonary Rehab  Date  10/23/18  Educator  [facility]  Instruction Review Code  1- Verbalizes Understanding      Controlling Sodium/Reading Food Labels: -Group verbal and written material supporting the discussion of sodium use in heart healthy nutrition. Review and explanation with models, verbal and written materials for utilization of the food label.   Pulmonary Rehab from 02/21/2017 in [facility] Cardiac and Pulmonary Rehab  Date  01/08/17  Educator  CR  Instruction Review Code (retired)  2- meets goals/outcomes      Exercise Physiology & General Exercise Guidelines: - Group verbal and written instruction with models to review the exercise physiology of the cardiovascular system and associated critical values. Provides general exercise guidelines  with specific guidelines to those with heart or  lung disease.    Pulmonary Rehab from 02/21/2017 in [facility] Cardiac and Pulmonary Rehab  Date  12/20/16  Educator  [facility]  Instruction Review Code (retired)  2- meets goals/outcomes      Aerobic Exercise & Resistance Training: - Gives group verbal and written instruction on the various components of exercise. Focuses on aerobic and resistive training programs and the benefits of this training and how to safely progress through these programs.   Pulmonary Rehab from 02/21/2017 in [facility] Cardiac and Pulmonary Rehab  Date  01/17/17  Educator  [facility]  Instruction Review Code (retired)  2- Statistician, Balance, Mind/Body Relaxation: Provides group verbal/written instruction on the benefits of flexibility and balance training, including mind/body exercise modes such as yoga, pilates and tai chi.  Demonstration and skill practice provided.   Pulmonary Rehab from 11/01/2018 in [facility] Cardiac and Pulmonary Rehab  Date  10/09/18  Educator  AS  Instruction Review Code  1- Verbalizes Understanding      Stress and Anxiety: - Provides group verbal and written instruction about the health risks of elevated stress and causes of high stress.  Discuss the correlation between heart/lung disease and anxiety and treatment options. Review healthy ways to manage with stress and anxiety.   Pulmonary Rehab from 11/01/2018 in [facility] Cardiac and Pulmonary Rehab  Date  10/30/18  Educator  [facility]  Instruction Review Code  1- Verbalizes Understanding      Depression: - Provides group verbal and written instruction on the correlation between heart/lung disease and depressed mood, treatment options, and the stigmas associated with seeking treatment.   Pulmonary Rehab from 11/01/2018 in [facility] Cardiac and Pulmonary Rehab  Date  10/02/18  Educator  [facility]  Instruction Review Code  1- Verbalizes Understanding      Exercise & Equipment Safety: - Individual verbal instruction and demonstration of equipment use  and safety with use of the equipment.   Pulmonary Rehab from 11/01/2018 in [facility] Cardiac and Pulmonary Rehab  Date  09/24/18  Educator  [facility]  Instruction Review Code  1- Verbalizes Understanding      Infection Prevention: - Provides verbal and written material to individual with discussion of infection control including proper hand washing and proper equipment cleaning during exercise session.   Pulmonary Rehab from 11/01/2018 in [facility] Cardiac and Pulmonary Rehab  Date  09/24/18  Educator  [facility]  Instruction Review Code  1- Verbalizes Understanding      Falls Prevention: - Provides verbal and written material to individual with discussion of falls prevention and safety.   Pulmonary Rehab from 11/01/2018 in [facility] Cardiac and Pulmonary Rehab  Date  09/24/18  Educator  [facility]  Instruction Review Code  1- Verbalizes Understanding      Diabetes: - Individual verbal and written instruction to review signs/symptoms of diabetes, desired ranges of glucose level fasting, after meals and with exercise. Advice that pre and post exercise glucose checks will be done for 3 sessions at entry of program.   Chronic Lung Diseases: - Group verbal and written instruction to review updates, respiratory medications, advancements in procedures and treatments. Discuss use of supplemental oxygen including available portable oxygen systems, continuous and intermittent flow rates, concentrators, personal use and safety guidelines. Review proper use of inhaler and spacers. Provide informative websites for self-education.    Pulmonary Rehab from 02/21/2017 in [facility] Cardiac and Pulmonary Rehab  Date  01/10/17  Educator  LB  Instruction Review Code (retired)  2- Theatre stage manager: - Provide group verbal and written instruction for methods to conserve energy, plan and organize activities. Instruct on pacing techniques, use of adaptive equipment and posture/positioning to relieve shortness of  breath.   Triggers and Exacerbations: - Group verbal and written instruction to review types of environmental triggers and ways to prevent exacerbations. Discuss weather changes, air quality and the benefits of nasal washing. Review warning signs and symptoms to help prevent infections. Discuss techniques for effective airway clearance, coughing, and vibrations.   Pulmonary Rehab from 02/21/2017 in [facility] Cardiac and Pulmonary Rehab  Date  02/07/17  Educator  LB  Instruction Review Code (retired)  2- meets goals/outcomes      AED/CPR: - Group verbal and written instruction with the use of models to demonstrate the basic use of the AED with the basic ABC's of resuscitation.   Anatomy and Physiology of the Lungs: - Group verbal and written instruction with the use of models to provide basic lung anatomy and physiology related to function, structure and complications of lung disease.   Pulmonary Rehab from 11/01/2018 in [facility] Cardiac and Pulmonary Rehab  Date  11/01/18  Educator  [facility]  Instruction Review Code  1- Verbalizes Understanding      Anatomy & Physiology of the Heart: - Group verbal and written instruction and models provide basic cardiac anatomy and physiology, with the coronary electrical and arterial systems. Review of Valvular disease and Heart Failure   Pulmonary Rehab from 02/21/2017 in [facility] Cardiac and Pulmonary Rehab  Date  02/02/17  Educator  CE  Instruction Review Code (retired)  2- meets goals/outcomes      Cardiac Medications: - Group verbal and written instruction to review commonly prescribed medications for heart disease. Reviews the medication, class of the drug, and side effects.   Know Your Numbers and Risk Factors: -Group verbal and written instruction about important numbers in your health.  Discussion of what are risk factors and how they play a role in the disease process.  Review of Cholesterol, Blood Pressure, Diabetes, and BMI and the role they play in  your overall health.   Sleep Hygiene: -Provides group verbal and written instruction about how sleep can affect your health.  Define sleep hygiene, discuss sleep cycles and impact of sleep habits. Review good sleep hygiene tips.    Other: -Provides group and verbal instruction on various topics (see comments)    Knowledge Questionnaire Score: Knowledge Questionnaire Score - 09/24/18 1135      Knowledge Questionnaire Score   Pre Score  12/18   REVIEWED with patient       Core Components/Risk Factors/Patient Goals at Admission: Personal Goals and Risk Factors at Admission - 09/24/18 1205      Core Components/Risk Factors/Patient Goals on Admission    Weight Management  Yes;Weight Loss    Intervention  Weight Management: Develop a combined nutrition and exercise program designed to reach desired caloric intake, while maintaining appropriate intake of nutrient and fiber, sodium and fats, and appropriate energy expenditure required for the weight goal.;Weight Management: Provide education and appropriate resources to help participant work on and attain dietary goals.;Weight Management/Obesity: Establish reasonable short term and long term weight goals.    Admit Weight  186 lb 4.8 oz (84.5 kg)    Goal Weight: Short Term  181 lb (82.1 kg)    Goal Weight: Long Term  175 lb (79.4 kg)    Expected  Outcomes  Short Term: Continue to assess and modify interventions until short term weight is achieved;Long Term: Adherence to nutrition and physical activity/exercise program aimed toward attainment of established weight goal;Weight Loss: Understanding of general recommendations for a balanced deficit meal plan, which promotes 1-2 lb weight loss per week and includes a negative energy balance of [phone] kcal/d;Understanding recommendations for meals to include 15-35% energy as protein, 25-35% energy from fat, 35-60% energy from carbohydrates, less than 240m of dietary cholesterol, 20-35 gm of total  fiber daily;Understanding of distribution of calorie intake throughout the day with the consumption of 4-5 meals/snacks    Improve shortness of breath with ADL's  Yes    Intervention  Provide education, individualized exercise plan and daily activity instruction to help decrease symptoms of SOB with activities of daily living.    Expected Outcomes  Short Term: Improve cardiorespiratory fitness to achieve a reduction of symptoms when performing ADLs;Long Term: Be able to perform more ADLs without symptoms or delay the onset of symptoms    Hypertension  Yes    Intervention  Provide education on lifestyle modifcations including regular physical activity/exercise, weight management, moderate sodium restriction and increased consumption of fresh fruit, vegetables, and low fat dairy, alcohol moderation, and smoking cessation.;Monitor prescription use compliance.    Expected Outcomes  Short Term: Continued assessment and intervention until BP is < 140/985mHG in hypertensive participants. < 130/8031mG in hypertensive participants with diabetes, heart failure or chronic kidney disease.;Long Term: Maintenance of blood pressure at goal levels.    Lipids  Yes    Intervention  Provide education and support for participant on nutrition & aerobic/resistive exercise along with prescribed medications to achieve LDL <2m33mDL >40mg52m Expected Outcomes  Short Term: Participant states understanding of desired cholesterol values and is compliant with medications prescribed. Participant is following exercise prescription and nutrition guidelines.;Long Term: Cholesterol controlled with medications as prescribed, with individualized exercise RX and with personalized nutrition plan. Value goals: LDL < 2mg,27m > 40 mg.       Core Components/Risk Factors/Patient Goals Review:  Goals and Risk Factor Review    Row Name 10/23/18 1433             Core Components/Risk Factors/Patient Goals Review   Personal Goals  Review  Weight Management/Obesity;Lipids;Hypertension;Improve shortness of breath with ADL's       Review  Patient has been checking his blood pressure at home everyday. He takes his medications regularly and has no questions on the,. When he gets short of breath he sits down and tries to catch his breath. His wife has him use a push mower for exercise. He states he still does some yard work. He wants to lose some more weight but states his wife is the cook and he eats what she makes. He states that he can do most of his daily activities but it takes him a little more time.       Expected Outcomes  Short: Attend [facility] regularly to improve shortness of breath with ADL's. Long: maintain independence with ADL's           Core Components/Risk Factors/Patient Goals at Discharge (Final Review):  Goals and Risk Factor Review - 10/23/18 1433      Core Components/Risk Factors/Patient Goals Review   Personal Goals Review  Weight Management/Obesity;Lipids;Hypertension;Improve shortness of breath with ADL's    Review  Patient has been checking his blood pressure at home everyday. He takes his medications regularly and has  no questions on the,. When he gets short of breath he sits down and tries to catch his breath. His wife has him use a push mower for exercise. He states he still does some yard work. He wants to lose some more weight but states his wife is the cook and he eats what she makes. He states that he can do most of his daily activities but it takes him a little more time.    Expected Outcomes  Short: Attend [facility] regularly to improve shortness of breath with ADL's. Long: maintain independence with ADL's        ITP Comments: ITP Comments    Row Name 09/24/18 1120 10/07/18 0821 11/04/18 0825       ITP Comments  Medical Evaluation completed. Chart sent for review and changes to Dr. Emily Filbert Director of [place]. Diagnosis can be found in [facility] encounter 09/03/18.  30 day review completed.  ITP sent to Dr. Emily Filbert Director of [place]. Continue with ITP unless changes are made by physician.  30 day review completed. ITP sent to Dr. Emily Filbert Director of [place]. Continue with ITP unless changes are made by physician.        Comment 30 day review

## 2018-11-05 ENCOUNTER — Ambulatory Visit (INDEPENDENT_AMBULATORY_CARE_PROVIDER_SITE_OTHER): Payer: Medicare Other | Admitting: Family Medicine

## 2018-11-05 ENCOUNTER — Encounter: Payer: Self-pay | Admitting: Family Medicine

## 2018-11-05 VITALS — BP 129/72 | HR 71 | Temp 97.7°F | Wt 193.0 lb

## 2018-11-05 DIAGNOSIS — M25472 Effusion, left ankle: Secondary | ICD-10-CM | POA: Diagnosis not present

## 2018-11-05 DIAGNOSIS — M7072 Other bursitis of hip, left hip: Secondary | ICD-10-CM | POA: Diagnosis not present

## 2018-11-05 MED ORDER — METHYLPREDNISOLONE ACETATE 80 MG/ML IJ SUSP
80.0000 mg | Freq: Once | INTRAMUSCULAR | Status: AC
  Administered 2018-11-05: 80 mg via INTRAMUSCULAR

## 2018-11-05 NOTE — Progress Notes (Signed)
Patient: James [name] Male    DOB: 12-08-1929   84 y.o.   MRN: [id number] Visit Date: 11/05/2018  Today's Provider: Lelon Huh, MD   Chief Complaint  Patient presents with  . Hip Pain    x 3 days   Subjective:    Hip Pain   Incident onset: 3 days ago. There was no injury mechanism. The pain is present in the left hip (radiates down to the left knee). Pertinent negatives include no numbness or tingling. The symptoms are aggravated by movement (also walking). He has tried rest for the symptoms.  Patient states no pain when sitting or laying down, but flares up when he stands and walks. Patient states the pain started after going to rehab last week. Took a hydrocodone/apap. Using biofreeze and theragesic.   He also reports his left ankle and foot were very swollen a few days, ago, but has mostly gone down now.     Allergies  Allergen Reactions  . Indomethacin Nausea Only     Current Outpatient Medications:  .  albuterol (PROAIR HFA) 108 (90 Base) MCG/ACT inhaler, INHALE 2 PUFFS INTO LUNGS EVERY 6 HOURS AS NEEDED FOR WHEEZING OR SHORTNESS OF BREATH, Disp: 8.5 g, Rfl: 5 .  atorvastatin (LIPITOR) 80 MG tablet, TAKE 1 TABLET BY MOUTH DAILY, Disp: 90 tablet, Rfl: 4 .  azelastine (OPTIVAR) 0.05 % ophthalmic solution, APPLY 1 DROP TO EYE TWICE DAILY, Disp: 6 mL, Rfl: 5 .  benazepril-hydrochlorthiazide (LOTENSIN HCT) 20-12.5 MG tablet, TAKE ONE TABLET BY MOUTH EVERY DAY, Disp: 90 tablet, Rfl: 4 .  beta carotene w/minerals (OCUVITE) tablet, Take 1 tablet by mouth daily., Disp: , Rfl:  .  chlorpheniramine-HYDROcodone (TUSSIONEX) 10-8 MG/5ML SUER, TAKE ONE TEASPOONFUL (5ML) EVERY TWELVE HOURS IF NEEDED FOR COUGH, Disp: 115 mL, Rfl: 0 .  ELIQUIS 5 MG TABS tablet, TAKE ONE TABLET BY MOUTH TWICE DAILY, Disp: 60 tablet, Rfl: 11 .  famotidine (PEPCID) 20 MG tablet, , Disp: , Rfl: 1 .  fluticasone (FLONASE) 50 MCG/ACT nasal spray, USE 2 SPRAYS IN EACH NOSTRIL DAILY, Disp: 48 g, Rfl: 3 .   Fluticasone-Umeclidin-Vilant (TRELEGY ELLIPTA) 100-62.5-25 MCG/INH AEPB, Inhale 1 applicator into the lungs daily. Rinse mouth after use. Stop symbicort and spiriva when taking this medicine., Disp: 1 each, Rfl: 10 .  HYDROcodone-acetaminophen (NORCO) 10-325 MG tablet, TAKE 1 TABLET BY MOUTH EVERY 6 HOURS AS NEEDED, Disp: 30 tablet, Rfl: 0 .  ipratropium (ATROVENT) 0.03 % nasal spray, , Disp: , Rfl: 1 .  lansoprazole (PREVACID) 30 MG capsule, TAKE 1 CAPSULE BY MOUTH EVERY DAY, Disp: 30 capsule, Rfl: 12 .  LORazepam (ATIVAN) 1 MG tablet, 1/2 TO 1 TABLET BY MOUTH AT BEDTIME AS NEEDED FOR SLEEP, Disp: 30 tablet, Rfl: 5 .  Magnesium 400 MG CAPS, Take 1 capsule by mouth daily. , Disp: , Rfl:  .  metoprolol tartrate (LOPRESSOR) 50 MG tablet, TAKE ONE TABLET BY MOUTH TWICE DAILY, Disp: 180 tablet, Rfl: 4 .  montelukast (SINGULAIR) 10 MG tablet, TAKE ONE TABLET EVERY DAY, Disp: 30 tablet, Rfl: 12 .  MULTIPLE VITAMIN PO, Take 1 tablet by mouth daily. , Disp: , Rfl:  .  Omega-3 Fatty Acids (FISH OIL) 1000 MG CAPS, Take 1 capsule daily by mouth., Disp: , Rfl:  .  tamsulosin (FLOMAX) 0.4 MG CAPS capsule, TAKE 1 CAPSULE BY MOUTH DAILY, Disp: 90 capsule, Rfl: 3 .  SPIRIVA HANDIHALER 18 MCG inhalation capsule, INHALE THE CONTENTS OF ONE CAPSULE  AS DIRECTED ONCE DAILY (Patient not taking: Reported on 11/05/2018), Disp: 30 capsule, Rfl: 12 .  SYMBICORT 160-4.5 MCG/ACT inhaler, TAKE 2 PUFFS INTO LUNGS TWICE A DAY (Patient not taking: Reported on 11/05/2018), Disp: 10.2 g, Rfl: 12  Review of Systems  Constitutional: Negative for appetite change, chills and fever.  Respiratory: Negative for chest tightness, shortness of breath and wheezing.   Cardiovascular: Negative for chest pain and palpitations.  Gastrointestinal: Negative for abdominal pain, nausea and vomiting.  Musculoskeletal: Positive for joint swelling (left foot).  Neurological: Negative for tingling and numbness.    Social History   Tobacco Use  .  Smoking status: Former Smoker    Packs/day: 1.00    Years: 11.00    Pack years: 11.00    Types: Cigarettes    Last attempt to quit: 02/12/1984    Years since quitting: 34.7  . Smokeless tobacco: Never Used  . Tobacco comment: quit smoking 30 yrs ago  Substance Use Topics  . Alcohol use: Yes    Alcohol/week: 0.0 standard drinks    Comment: beer occasionally   Objective:   BP 129/72 (BP Location: Right Arm, Patient Position: Sitting, Cuff Size: Large)   Pulse 71   Temp 97.7 F (36.5 C) (Oral)   Wt 193 lb (87.5 kg)   SpO2 99%   BMI 32.62 kg/m  Vitals:   11/05/18 1623  BP: 129/72  Pulse: 71  Temp: 97.7 F (36.5 C)  TempSrc: Oral  SpO2: 99%  Weight: 193 lb (87.5 kg)     Physical Exam   General Appearance:    Alert, cooperative, no distress  Eyes:    PERRL, conjunctiva/corneas clear, EOM's intact       Lungs:     Clear to auscultation bilaterally, respirations unlabored  Ext::    2+ edema left ankle, 1+ edema right ankle.   MS::   Tender left para lumbar muscles and down left lateral hip and thigh. No gross deformities.           Assessment & Plan:     1. Bursitis of other bursa of left hip  - methylPREDNISolone acetate (DEPO-MEDROL) injection 80 mg His wife asks about need for x-rays. Advised to see how steroid injection. If not much in 2-3 days then call back for x-ray order.  2. Ankle swelling, left Much better today, suspect brief gout flare.          Lelon Huh, MD  [facility]

## 2018-11-08 ENCOUNTER — Telehealth: Payer: Self-pay | Admitting: Family Medicine

## 2018-11-08 DIAGNOSIS — M25552 Pain in left hip: Secondary | ICD-10-CM

## 2018-11-08 NOTE — Telephone Encounter (Signed)
Pt calling to let Dr Caryn Section know the injection he gave pt has not helped his hip.  Please advise.  Thanks, [employer]

## 2018-11-10 ENCOUNTER — Other Ambulatory Visit: Payer: Self-pay | Admitting: Family Medicine

## 2018-11-11 NOTE — Telephone Encounter (Signed)
Patient advised as below. Patient reports pain is better today. Patient reports that he would like to have xray done.

## 2018-11-11 NOTE — Telephone Encounter (Signed)
He needs xray left hip. Please advised and enter order. Thanks.

## 2018-11-13 ENCOUNTER — Telehealth: Payer: Self-pay | Admitting: Family Medicine

## 2018-11-13 DIAGNOSIS — N400 Enlarged prostate without lower urinary tract symptoms: Secondary | ICD-10-CM

## 2018-11-13 MED ORDER — TAMSULOSIN HCL 0.4 MG PO CAPS: 0.4000 mg | ORAL_CAPSULE | Freq: Every day | ORAL | 0 refills | Status: DC

## 2018-11-13 NOTE — Telephone Encounter (Signed)
[facility] faxed refill request for the following medications:  tamsulosin (FLOMAX) 0.4 MG CAPS capsule  Qty: 90  Please advise.

## 2018-11-13 NOTE — Telephone Encounter (Signed)
Sent in 90 day no refill

## 2018-11-18 ENCOUNTER — Encounter: Payer: Medicare Other | Attending: Internal Medicine

## 2018-11-18 DIAGNOSIS — E785 Hyperlipidemia, unspecified: Secondary | ICD-10-CM | POA: Insufficient documentation

## 2018-11-18 DIAGNOSIS — N400 Enlarged prostate without lower urinary tract symptoms: Secondary | ICD-10-CM | POA: Insufficient documentation

## 2018-11-18 DIAGNOSIS — Z7951 Long term (current) use of inhaled steroids: Secondary | ICD-10-CM | POA: Insufficient documentation

## 2018-11-18 DIAGNOSIS — R0609 Other forms of dyspnea: Secondary | ICD-10-CM | POA: Insufficient documentation

## 2018-11-18 DIAGNOSIS — Z8673 Personal history of transient ischemic attack (TIA), and cerebral infarction without residual deficits: Secondary | ICD-10-CM | POA: Insufficient documentation

## 2018-11-18 DIAGNOSIS — J449 Chronic obstructive pulmonary disease, unspecified: Secondary | ICD-10-CM | POA: Insufficient documentation

## 2018-11-18 DIAGNOSIS — Z8546 Personal history of malignant neoplasm of prostate: Secondary | ICD-10-CM | POA: Insufficient documentation

## 2018-11-18 DIAGNOSIS — I480 Paroxysmal atrial fibrillation: Secondary | ICD-10-CM | POA: Insufficient documentation

## 2018-11-18 DIAGNOSIS — K219 Gastro-esophageal reflux disease without esophagitis: Secondary | ICD-10-CM | POA: Insufficient documentation

## 2018-11-18 DIAGNOSIS — I2510 Atherosclerotic heart disease of native coronary artery without angina pectoris: Secondary | ICD-10-CM | POA: Insufficient documentation

## 2018-11-18 DIAGNOSIS — Z79899 Other long term (current) drug therapy: Secondary | ICD-10-CM | POA: Insufficient documentation

## 2018-11-18 DIAGNOSIS — I10 Essential (primary) hypertension: Secondary | ICD-10-CM | POA: Insufficient documentation

## 2018-11-18 DIAGNOSIS — M1990 Unspecified osteoarthritis, unspecified site: Secondary | ICD-10-CM | POA: Insufficient documentation

## 2018-11-18 DIAGNOSIS — H9190 Unspecified hearing loss, unspecified ear: Secondary | ICD-10-CM | POA: Insufficient documentation

## 2018-11-18 DIAGNOSIS — Z87891 Personal history of nicotine dependence: Secondary | ICD-10-CM | POA: Insufficient documentation

## 2018-11-18 DIAGNOSIS — Z7901 Long term (current) use of anticoagulants: Secondary | ICD-10-CM | POA: Insufficient documentation

## 2018-11-20 ENCOUNTER — Telehealth: Payer: Self-pay

## 2018-11-20 NOTE — Telephone Encounter (Signed)
Going to try and come back Friday - has been out with hip trouble

## 2018-11-27 ENCOUNTER — Telehealth: Payer: Self-pay

## 2018-11-27 DIAGNOSIS — R0609 Other forms of dyspnea: Principal | ICD-10-CM

## 2018-11-27 DIAGNOSIS — R0600 Dyspnea, unspecified: Secondary | ICD-10-CM

## 2018-11-27 NOTE — Progress Notes (Signed)
Discharge Progress Report  Patient Details  Name: James [name] MRN: [id number] Date of Birth: 1929/07/20 Referring Provider:     Pulmonary Rehab from 09/24/2018 in [facility]  Referring Provider  Ramachandran       Number of Visits: 11/36  Reason for Discharge:  Early Exit:  Personal and Lack of attendance  Smoking History:  Social History   Tobacco Use  Smoking Status Former Smoker  . Packs/day: 1.00  . Years: 11.00  . Pack years: 11.00  . Types: Cigarettes  . Last attempt to quit: 02/12/1984  . Years since quitting: 34.8  Smokeless Tobacco Never Used  Tobacco Comment   quit smoking 30 yrs ago    Diagnosis:  DOE (dyspnea on exertion)  ADL UCSD: Pulmonary Assessment Scores    Row Name 09/24/18 1132         ADL UCSD   ADL Phase  Entry     SOB Score total  36     Rest  0     Walk  4     Stairs  4     Bath  1     Dress  1     Shop  1       CAT Score   CAT Score  11       mMRC Score   mMRC Score  2        Initial Exercise Prescription: Initial Exercise Prescription - 09/24/18 1200      Date of Initial Exercise RX and Referring Provider   Date  09/24/18    Referring Provider  Ramachandran      Treadmill   MPH  1    Grade  0    Minutes  15    METs  1.7      NuStep   Level  1    SPM  80    Minutes  15    METs  1.5      Biostep-RELP   Level  2    SPM  50    Minutes  15    METs  1.5      Prescription Details   Frequency (times per week)  3    Duration  Progress to 45 minutes of aerobic exercise without signs/symptoms of physical distress      Intensity   THRR 40-80% of Max Heartrate  85-117    Ratings of Perceived Exertion  11-15    Perceived Dyspnea  0-4      Resistance Training   Training Prescription  Yes    Weight  3 lb    Reps  10-15       Discharge Exercise Prescription (Final Exercise Prescription Changes): Exercise Prescription Changes - 11/06/18 1100      Response to Exercise   Blood Pressure  (Admit)  118/62    Blood Pressure (Exit)  116/68    Heart Rate (Admit)  78 bpm    Heart Rate (Exercise)  92 bpm    Heart Rate (Exit)  67 bpm    Oxygen Saturation (Admit)  96 %    Oxygen Saturation (Exercise)  92 %    Oxygen Saturation (Exit)  97 %    Rating of Perceived Exertion (Exercise)  12    Perceived Dyspnea (Exercise)  3    Symptoms  none    Duration  Continue with 45 min of aerobic exercise without signs/symptoms of physical distress.    Intensity  THRR unchanged  Progression   Progression  Continue to progress workloads to maintain intensity without signs/symptoms of physical distress.    Average METs  2      Resistance Training   Training Prescription  Yes    Weight  3 lb    Reps  10-15      Interval Training   Interval Training  No      Treadmill   MPH  1    Grade  0    Minutes  15    METs  1.77      NuStep   Level  1    SPM  80    Minutes  15    METs  2.2      Biostep-RELP   Level  2    SPM  50    Minutes  15    METs  2      Home Exercise Plan   Plans to continue exercise at  [employer]    Frequency  Add 1 additional day to program exercise sessions.    Initial Home Exercises Provided  10/23/18       Functional Capacity: 6 Minute Walk    Row Name 09/24/18 1222         6 Minute Walk   Phase  Initial     Distance  900 feet     Walk Time  6 minutes     # of Rest Breaks  0     MPH  1.7     METS  0.93     RPE  13     Perceived Dyspnea   1     VO2 Peak  3.29     Symptoms  No     Resting HR  53 bpm     Resting BP  106/56     Resting Oxygen Saturation   95 %     Exercise Oxygen Saturation  during 6 min walk  91 %     Max Ex. HR  89 bpm     Max Ex. BP  136/64     2 Minute Post BP  110/56       Interval HR   1 Minute HR  75     2 Minute HR  76     3 Minute HR  80     4 Minute HR  83     5 Minute HR  89     6 Minute HR  74     2 Minute Post HR  77     Interval Heart Rate?  Yes       Interval Oxygen   Interval Oxygen?  Yes      Baseline Oxygen Saturation %  95 %     1 Minute Oxygen Saturation %  92 %     1 Minute Liters of Oxygen  0 L     2 Minute Oxygen Saturation %  91 %     2 Minute Liters of Oxygen  0 L     3 Minute Oxygen Saturation %  92 %     3 Minute Liters of Oxygen  0 L     4 Minute Oxygen Saturation %  91 %     4 Minute Liters of Oxygen  0 L     5 Minute Oxygen Saturation %  92 %     5 Minute Liters of Oxygen  0 L     6 Minute Oxygen  Saturation %  92 %     6 Minute Liters of Oxygen  0 L     2 Minute Post Oxygen Saturation %  94 %     2 Minute Post Liters of Oxygen  0 L        Psychological, QOL, Others - Outcomes: PHQ 2/9: Depression screen [facility] 2/9 09/24/2018 05/08/2018 02/13/2018 02/09/2017 10/24/2016  Decreased Interest 0 0 2 0 0  Down, Depressed, Hopeless 0 0 2 0 0  PHQ - 2 Score 0 0 4 0 0  Altered sleeping 0 - _0 Tired, decreased energy 1 - _1 Change in appetite 2 - 2 0 0  Feeling bad or failure about yourself  0 - 2 0 2  Trouble concentrating 0 - 0 0 1  Moving slowly or fidgety/restless 0 - 0 0 0  Suicidal thoughts 0 - 0 0 0  PHQ-9 Score 3 - _2 Difficult doing work/chores Not difficult at all - Very difficult Not difficult at all Somewhat difficult  Some recent data might be hidden    Quality of Life:   Personal Goals: Goals established at orientation with interventions provided to work toward goal. Personal Goals and Risk Factors at Admission - 09/24/18 1205      Core Components/Risk Factors/Patient Goals on Admission    Weight Management  Yes;Weight Loss    Intervention  Weight Management: Develop a combined nutrition and exercise program designed to reach desired caloric intake, while maintaining appropriate intake of nutrient and fiber, sodium and fats, and appropriate energy expenditure required for the weight goal.;Weight Management: Provide education and appropriate resources to help participant work on and attain dietary goals.;Weight Management/Obesity:  Establish reasonable short term and long term weight goals.    Admit Weight  186 lb 4.8 oz (84.5 kg)    Goal Weight: Short Term  181 lb (82.1 kg)    Goal Weight: Long Term  175 lb (79.4 kg)    Expected Outcomes  Short Term: Continue to assess and modify interventions until short term weight is achieved;Long Term: Adherence to nutrition and physical activity/exercise program aimed toward attainment of established weight goal;Weight Loss: Understanding of general recommendations for a balanced deficit meal plan, which promotes 1-2 lb weight loss per week and includes a negative energy balance of [phone] kcal/d;Understanding recommendations for meals to include 15-35% energy as protein, 25-35% energy from fat, 35-60% energy from carbohydrates, less than 241m of dietary cholesterol, 20-35 gm of total fiber daily;Understanding of distribution of calorie intake throughout the day with the consumption of 4-5 meals/snacks    Improve shortness of breath with ADL's  Yes    Intervention  Provide education, individualized exercise plan and daily activity instruction to help decrease symptoms of SOB with activities of daily living.    Expected Outcomes  Short Term: Improve cardiorespiratory fitness to achieve a reduction of symptoms when performing ADLs;Long Term: Be able to perform more ADLs without symptoms or delay the onset of symptoms    Hypertension  Yes    Intervention  Provide education on lifestyle modifcations including regular physical activity/exercise, weight management, moderate sodium restriction and increased consumption of fresh fruit, vegetables, and low fat dairy, alcohol moderation, and smoking cessation.;Monitor prescription use compliance.    Expected Outcomes  Short Term: Continued assessment and intervention until BP is < 140/985mHG in hypertensive participants. < 130/8069mG in hypertensive participants with diabetes, heart failure or chronic kidney disease.;Long Term: Maintenance  of blood  pressure at goal levels.    Lipids  Yes    Intervention  Provide education and support for participant on nutrition & aerobic/resistive exercise along with prescribed medications to achieve LDL <58m, HDL >455m    Expected Outcomes  Short Term: Participant states understanding of desired cholesterol values and is compliant with medications prescribed. Participant is following exercise prescription and nutrition guidelines.;Long Term: Cholesterol controlled with medications as prescribed, with individualized exercise RX and with personalized nutrition plan. Value goals: LDL < 7067mHDL > 40 mg.        Personal Goals Discharge: Goals and Risk Factor Review    Row Name 10/23/18 1433             Core Components/Risk Factors/Patient Goals Review   Personal Goals Review  Weight Management/Obesity;Lipids;Hypertension;Improve shortness of breath with ADL's       Review  Patient has been checking his blood pressure at home everyday. He takes his medications regularly and has no questions on the,. When he gets short of breath he sits down and tries to catch his breath. His wife has him use a push mower for exercise. He states he still does some yard work. He wants to lose some more weight but states his wife is the cook and he eats what she makes. He states that he can do most of his daily activities but it takes him a little more time.       Expected Outcomes  Short: Attend [facility] regularly to improve shortness of breath with ADL's. Long: maintain independence with ADL's           Exercise Goals and Review: Exercise Goals    Row Name 09/24/18 1221             Exercise Goals   Increase Physical Activity  Yes       Intervention  Provide advice, education, support and counseling about physical activity/exercise needs.;Develop an individualized exercise prescription for aerobic and resistive training based on initial evaluation findings, risk stratification, comorbidities and participant's  personal goals.       Expected Outcomes  Short Term: Attend rehab on a regular basis to increase amount of physical activity.;Long Term: Add in home exercise to make exercise part of routine and to increase amount of physical activity.;Long Term: Exercising regularly at least 3-5 days a week.       Increase Strength and Stamina  Yes       Intervention  Provide advice, education, support and counseling about physical activity/exercise needs.;Develop an individualized exercise prescription for aerobic and resistive training based on initial evaluation findings, risk stratification, comorbidities and participant's personal goals.       Expected Outcomes  Short Term: Increase workloads from initial exercise prescription for resistance, speed, and METs.;Short Term: Perform resistance training exercises routinely during rehab and add in resistance training at home;Long Term: Improve cardiorespiratory fitness, muscular endurance and strength as measured by increased METs and functional capacity (6MWT)       Able to understand and use rate of perceived exertion (RPE) scale  Yes       Intervention  Provide education and explanation on how to use RPE scale       Expected Outcomes  Short Term: Able to use RPE daily in rehab to express subjective intensity level;Long Term:  Able to use RPE to guide intensity level when exercising independently       Able to understand and use Dyspnea scale  Yes  Intervention  Provide education and explanation on how to use Dyspnea scale       Expected Outcomes  Short Term: Able to use Dyspnea scale daily in rehab to express subjective sense of shortness of breath during exertion;Long Term: Able to use Dyspnea scale to guide intensity level when exercising independently       Knowledge and understanding of Target Heart Rate Range (THRR)  Yes       Intervention  Provide education and explanation of THRR including how the numbers were predicted and where they are located for  reference       Expected Outcomes  Short Term: Able to state/look up THRR;Short Term: Able to use daily as guideline for intensity in rehab;Long Term: Able to use THRR to govern intensity when exercising independently       Able to check pulse independently  Yes       Intervention  Provide education and demonstration on how to check pulse in carotid and radial arteries.;Review the importance of being able to check your own pulse for safety during independent exercise       Expected Outcomes  Long Term: Able to check pulse independently and accurately;Short Term: Able to explain why pulse checking is important during independent exercise       Understanding of Exercise Prescription  Yes       Intervention  Provide education, explanation, and written materials on patient's individual exercise prescription       Expected Outcomes  Short Term: Able to explain program exercise prescription;Long Term: Able to explain home exercise prescription to exercise independently          Exercise Goals Re-Evaluation: Exercise Goals Re-Evaluation    Row Name 09/30/18 1013 10/09/18 1238 10/23/18 1042 11/06/18 1142       Exercise Goal Re-Evaluation   Exercise Goals Review  Increase Physical Activity;Increase Strength and Stamina;Able to understand and use rate of perceived exertion (RPE) scale;Able to understand and use Dyspnea scale;Knowledge and understanding of Target Heart Rate Range (THRR);Understanding of Exercise Prescription  Increase Physical Activity;Increase Strength and Stamina;Able to understand and use rate of perceived exertion (RPE) scale;Able to understand and use Dyspnea scale;Knowledge and understanding of Target Heart Rate Range (THRR)  Increase Physical Activity;Able to understand and use rate of perceived exertion (RPE) scale;Knowledge and understanding of Target Heart Rate Range (THRR);Understanding of Exercise Prescription;Increase Strength and Stamina;Able to understand and use Dyspnea  scale;Able to check pulse independently  Increase Physical Activity;Increase Strength and Stamina;Able to understand and use rate of perceived exertion (RPE) scale;Able to understand and use Dyspnea scale;Knowledge and understanding of Target Heart Rate Range (THRR)    Comments  Reviewed RPE scale, THR and program prescription with pt today.  Pt voiced understanding and was given a copy of goals to take home.  Berle is in his second full week of exercise.  He is tolerating exercise well.  Staff will monitor progress.   Reviewed home exercise with pt today.  Pt plans to walk and join [employer] for exercise.  Reviewed THR, pulse, RPE, sign and symptoms, NTG use, and when to call 911 or MD.  Also discussed weather considerations and indoor options.  Pt voiced understanding.  Eamon tolerates exercise well.  He has missed sessions due to illness.  Staff will encourage increase on TM.    Expected Outcomes  Short: Use RPE daily to regulate intensity. Long: Follow program prescription in THR.  Short - attend 3 days per week Long - increase  overall MET level  Short - add one day per week at home walking Long - join [employer] and maintain exercise   Short - increase incline on TM Long - increase MET level       Nutrition & Weight - Outcomes: Pre Biometrics - 09/24/18 1220      Pre Biometrics   Height  5' 4.5" (1.638 m)    Weight  186 lb 4.8 oz (84.5 kg)    Waist Circumference  42 inches    Hip Circumference  43 inches    Waist to Hip Ratio  0.98 %    BMI (Calculated)  31.5    Single Leg Stand  3.96 seconds        Nutrition: Nutrition Therapy & Goals - 10/07/18 1058      Nutrition Therapy   Diet  TLC    Drug/Food Interactions  Statins/Certain Fruits    Protein (specify units)  8oz    Fiber  30 grams    Whole Grain Foods  3 servings   prefers rye bread   Saturated Fats  14 max. grams    Fruits and Vegetables  6 servings/day   8 ideal; does not eat large portions but likes fruits and vegetables    Sodium  1500 grams      Personal Nutrition Goals   Nutrition Goal  Start to pay more attention to the amount of sodium you are consuming. For example, buy lower salt crackers or work with your wife to use less salt during the cooking process    Personal Goal #2  Have a glass of water near you at home to remind you to drink more during the day. Aim to drink one additional glass of water each day    Comments  He eats oatmeal for breakfast daily, 1/2 a sandwich for lunchy and has a larger dinner. He and his wife eat out for dinners half of the time he estimates at restaurants nearby. He feels his downfall is the amount of snacking he does but is trying to choose smaller portions of high-sugar options like ice cream. Does not eat much red meat as it is difficult for him to chew. He drinks mostly water and some orange juice. They buy canned items when certain fruits / vegetables are out of season. His wife currently cooks with salt especially for options like soup      Intervention Plan   Intervention  Prescribe, educate and counsel regarding individualized specific dietary modifications aiming towards targeted core components such as weight, hypertension, lipid management, diabetes, heart failure and other comorbidities.    Expected Outcomes  Long Term Goal: Adherence to prescribed nutrition plan.;Short Term Goal: A plan has been developed with personal nutrition goals set during dietitian appointment.;Short Term Goal: Understand basic principles of dietary content, such as calories, fat, sodium, cholesterol and nutrients.       Nutrition Discharge:   Education Questionnaire Score: Knowledge Questionnaire Score - 09/24/18 1135      Knowledge Questionnaire Score   Pre Score  12/18   REVIEWED with patient      Goals reviewed with patient; copy given to patient.

## 2018-11-27 NOTE — Telephone Encounter (Signed)
Called patient to see if he was ready to come back to [place]. Patient has not been to rehab in over a month. Patient states he is not ready to come back and with the holidays coming it would be awhile yet. Informed patient of discharge. Patient verbalizes understanding.

## 2018-11-27 NOTE — Progress Notes (Signed)
Pulmonary Individual Treatment Plan  Patient Details  Name: James [name] MRN: [id number] Date of Birth: 30-May-1929 Referring Provider:     Pulmonary Rehab from 09/24/2018 in [facility] Cardiac and Pulmonary Rehab  Referring Provider  Ramachandran      Initial Encounter Date:    Pulmonary Rehab from 09/24/2018 in [facility] Cardiac and Pulmonary Rehab  Date  09/24/18      Visit Diagnosis: DOE (dyspnea on exertion)  Patient's Home Medications on Admission:  Current Outpatient Medications:  .  albuterol (PROAIR HFA) 108 (90 Base) MCG/ACT inhaler, INHALE 2 PUFFS INTO LUNGS EVERY 6 HOURS AS NEEDED FOR WHEEZING OR SHORTNESS OF BREATH, Disp: 8.5 g, Rfl: 5 .  atorvastatin (LIPITOR) 80 MG tablet, TAKE 1 TABLET BY MOUTH DAILY, Disp: 90 tablet, Rfl: 4 .  azelastine (OPTIVAR) 0.05 % ophthalmic solution, APPLY 1 DROP TO EYE TWICE DAILY, Disp: 6 mL, Rfl: 5 .  benazepril-hydrochlorthiazide (LOTENSIN HCT) 20-12.5 MG tablet, TAKE ONE TABLET BY MOUTH EVERY DAY, Disp: 90 tablet, Rfl: 4 .  beta carotene w/minerals (OCUVITE) tablet, Take 1 tablet by mouth daily., Disp: , Rfl:  .  chlorpheniramine-HYDROcodone (TUSSIONEX) 10-8 MG/5ML SUER, TAKE ONE TEASPOONFUL (5ML) EVERY TWELVE HOURS IF NEEDED FOR COUGH, Disp: 115 mL, Rfl: 0 .  ELIQUIS 5 MG TABS tablet, TAKE ONE TABLET BY MOUTH TWICE DAILY, Disp: 60 tablet, Rfl: 11 .  famotidine (PEPCID) 20 MG tablet, , Disp: , Rfl: 1 .  fluticasone (FLONASE) 50 MCG/ACT nasal spray, USE 2 SPRAYS IN EACH NOSTRIL DAILY, Disp: 48 g, Rfl: 3 .  Fluticasone-Umeclidin-Vilant (TRELEGY ELLIPTA) 100-62.5-25 MCG/INH AEPB, Inhale 1 applicator into the lungs daily. Rinse mouth after use. Stop symbicort and spiriva when taking this medicine., Disp: 1 each, Rfl: 10 .  HYDROcodone-acetaminophen (NORCO) 10-325 MG tablet, TAKE 1 TABLET BY MOUTH EVERY 6 HOURS AS NEEDED, Disp: 30 tablet, Rfl: 0 .  ipratropium (ATROVENT) 0.03 % nasal spray, , Disp: , Rfl: 1 .  lansoprazole (PREVACID) 30 MG capsule, TAKE 1  CAPSULE BY MOUTH EVERY DAY, Disp: 30 capsule, Rfl: 12 .  LORazepam (ATIVAN) 1 MG tablet, 1/2 TO 1 TABLET BY MOUTH AT BEDTIME AS NEEDED FOR SLEEP, Disp: 30 tablet, Rfl: 5 .  Magnesium 400 MG CAPS, Take 1 capsule by mouth daily. , Disp: , Rfl:  .  metoprolol tartrate (LOPRESSOR) 50 MG tablet, TAKE ONE TABLET BY MOUTH TWICE DAILY, Disp: 180 tablet, Rfl: 4 .  montelukast (SINGULAIR) 10 MG tablet, TAKE ONE TABLET EVERY DAY, Disp: 30 tablet, Rfl: 12 .  MULTIPLE VITAMIN PO, Take 1 tablet by mouth daily. , Disp: , Rfl:  .  Omega-3 Fatty Acids (FISH OIL) 1000 MG CAPS, Take 1 capsule daily by mouth., Disp: , Rfl:  .  SPIRIVA HANDIHALER 18 MCG inhalation capsule, INHALE THE CONTENTS OF ONE CAPSULE AS DIRECTED ONCE DAILY (Patient not taking: Reported on 11/05/2018), Disp: 30 capsule, Rfl: 12 .  SYMBICORT 160-4.5 MCG/ACT inhaler, TAKE 2 PUFFS INTO LUNGS TWICE A DAY (Patient not taking: Reported on 11/05/2018), Disp: 10.2 g, Rfl: 12 .  tamsulosin (FLOMAX) 0.4 MG CAPS capsule, Take 1 capsule (0.4 mg total) by mouth daily., Disp: 90 capsule, Rfl: 0  Past Medical History: Past Medical History:  Diagnosis Date  . Arthritis   . Back pain   . CAD (coronary artery disease)   . Cataract    right  . COPD (chronic obstructive pulmonary disease) ([place])    SPiriva and SYmbicort daily. Albuterol as needed  . Diverticulosis   .  Dyspnea    with exertion  . Enlarged prostate    takes Flomax daily  . GERD (gastroesophageal reflux disease)   . History of chicken pox   . History of gout   . History of measles   . History of mumps   . HOH (hard of hearing)   . Hyperglycemia   . Hyperlipidemia    takes Atorvastatin daily  . Hypertension    takes Metoprolol daily as well as Lotensin HCT  . Hypotension   . Joint pain   . Microscopic colitis   . PAF (paroxysmal atrial fibrillation) ([place]), RVR 03/14/2017  . Pneumonia   . Prostate cancer ([place])   . Stroke [facility])    TIA  . TIA (transient ischemic attack)   .  Weakness    numbness and tingling.mainly on right    Tobacco Use: Social History   Tobacco Use  Smoking Status Former Smoker  . Packs/day: 1.00  . Years: 11.00  . Pack years: 11.00  . Types: Cigarettes  . Last attempt to quit: 02/12/1984  . Years since quitting: 34.8  Smokeless Tobacco Never Used  Tobacco Comment   quit smoking 30 yrs ago    Labs: Recent Review Flowsheet Data    Labs for ITP Cardiac and Pulmonary Rehab Latest Ref Rng & Units 10/05/2014 10/07/2014 10/31/2017   Cholestrol 0 - 200 mg/dL - 104 106   LDLCALC 0 - 99 mg/dL - 40 45   HDL >40 mg/dL - 52 52   Trlycerides <150 mg/dL - 61 47   Hemoglobin A1c 4.8 - 5.6 % 5.7 5.7 6.1(H)       Pulmonary Assessment Scores: Pulmonary Assessment Scores    Row Name 09/24/18 1132         ADL UCSD   ADL Phase  Entry     SOB Score total  36     Rest  0     Walk  4     Stairs  4     Bath  1     Dress  1     Shop  1       CAT Score   CAT Score  11       mMRC Score   mMRC Score  2        Pulmonary Function Assessment: Pulmonary Function Assessment - 09/24/18 1202      Breath   Bilateral Breath Sounds  Clear    Shortness of Breath  Yes;Limiting activity       Exercise Target Goals: Exercise Program Goal: Individual exercise prescription set using results from initial 6 min walk test and THRR while considering  patient's activity barriers and safety.   Exercise Prescription Goal: Initial exercise prescription builds to 30-45 minutes a day of aerobic activity, 2-3 days per week.  Home exercise guidelines will be given to patient during program as part of exercise prescription that the participant will acknowledge.  Activity Barriers & Risk Stratification:   6 Minute Walk: 6 Minute Walk    Row Name 09/24/18 1222         6 Minute Walk   Phase  Initial     Distance  900 feet     Walk Time  6 minutes     # of Rest Breaks  0     MPH  1.7     METS  0.93     RPE  13     Perceived Dyspnea   1  VO2 Peak  3.29     Symptoms  No     Resting HR  53 bpm     Resting BP  106/56     Resting Oxygen Saturation   95 %     Exercise Oxygen Saturation  during 6 min walk  91 %     Max Ex. HR  89 bpm     Max Ex. BP  136/64     2 Minute Post BP  110/56       Interval HR   1 Minute HR  75     2 Minute HR  76     3 Minute HR  80     4 Minute HR  83     5 Minute HR  89     6 Minute HR  74     2 Minute Post HR  77     Interval Heart Rate?  Yes       Interval Oxygen   Interval Oxygen?  Yes     Baseline Oxygen Saturation %  95 %     1 Minute Oxygen Saturation %  92 %     1 Minute Liters of Oxygen  0 L     2 Minute Oxygen Saturation %  91 %     2 Minute Liters of Oxygen  0 L     3 Minute Oxygen Saturation %  92 %     3 Minute Liters of Oxygen  0 L     4 Minute Oxygen Saturation %  91 %     4 Minute Liters of Oxygen  0 L     5 Minute Oxygen Saturation %  92 %     5 Minute Liters of Oxygen  0 L     6 Minute Oxygen Saturation %  92 %     6 Minute Liters of Oxygen  0 L     2 Minute Post Oxygen Saturation %  94 %     2 Minute Post Liters of Oxygen  0 L       Oxygen Initial Assessment: Oxygen Initial Assessment - 09/24/18 1202      Home Oxygen   Home Oxygen Device  None    Sleep Oxygen Prescription  None    Home Exercise Oxygen Prescription  None    Home at Rest Exercise Oxygen Prescription  None      Initial 6 min Walk   Oxygen Used  None      Program Oxygen Prescription   Program Oxygen Prescription  None      Intervention   Short Term Goals  To learn and demonstrate proper pursed lip breathing techniques or other breathing techniques.;To learn and understand importance of maintaining oxygen saturations>88%;To learn and understand importance of monitoring SPO2 with pulse oximeter and demonstrate accurate use of the pulse oximeter.;To learn and demonstrate proper use of respiratory medications    Long  Term Goals  Verbalizes importance of monitoring SPO2 with pulse oximeter and  return demonstration;Maintenance of O2 saturations>88%;Exhibits proper breathing techniques, such as pursed lip breathing or other method taught during program session;Compliance with respiratory medication;Demonstrates proper use of MDI's       Oxygen Re-Evaluation: Oxygen Re-Evaluation    Row Name 09/30/18 1014 10/23/18 1425           Program Oxygen Prescription   Program Oxygen Prescription  None  None        Home Oxygen  Home Oxygen Device  None  None      Sleep Oxygen Prescription  None  None      Home Exercise Oxygen Prescription  None  None      Home at Rest Exercise Oxygen Prescription  None  None        Goals/Expected Outcomes   Short Term Goals  To learn and demonstrate proper pursed lip breathing techniques or other breathing techniques.;To learn and understand importance of maintaining oxygen saturations>88%;To learn and understand importance of monitoring SPO2 with pulse oximeter and demonstrate accurate use of the pulse oximeter.;To learn and demonstrate proper use of respiratory medications  To learn and demonstrate proper pursed lip breathing techniques or other breathing techniques.;To learn and understand importance of maintaining oxygen saturations>88%;To learn and understand importance of monitoring SPO2 with pulse oximeter and demonstrate accurate use of the pulse oximeter.;To learn and demonstrate proper use of respiratory medications      Long  Term Goals  Verbalizes importance of monitoring SPO2 with pulse oximeter and return demonstration;Maintenance of O2 saturations>88%;Exhibits proper breathing techniques, such as pursed lip breathing or other method taught during program session;Compliance with respiratory medication;Demonstrates proper use of MDI's  Verbalizes importance of monitoring SPO2 with pulse oximeter and return demonstration;Maintenance of O2 saturations>88%;Exhibits proper breathing techniques, such as pursed lip breathing or other method taught during  program session;Compliance with respiratory medication;Demonstrates proper use of MDI's      Comments  Reviewed PLB technique with pt.  Talked about how it work and it's important to maintaining his exercise saturations.    Patient has been trying to practice with his PLB. He takes his inhalers regularly and has no questions on his medications. He states that he has a spacer and have reviewed with patient how to use it. He wears no oxygen at home or CPAP when he sleeps.      Goals/Expected Outcomes  Short: Become more profiecient at using PLB.   Long: Become independent at using PLB.  Short: check oxygen at home when on exertion. Long: maintain oxygen saturations above 88 percent independently.         Oxygen Discharge (Final Oxygen Re-Evaluation): Oxygen Re-Evaluation - 10/23/18 1425      Program Oxygen Prescription   Program Oxygen Prescription  None      Home Oxygen   Home Oxygen Device  None    Sleep Oxygen Prescription  None    Home Exercise Oxygen Prescription  None    Home at Rest Exercise Oxygen Prescription  None      Goals/Expected Outcomes   Short Term Goals  To learn and demonstrate proper pursed lip breathing techniques or other breathing techniques.;To learn and understand importance of maintaining oxygen saturations>88%;To learn and understand importance of monitoring SPO2 with pulse oximeter and demonstrate accurate use of the pulse oximeter.;To learn and demonstrate proper use of respiratory medications    Long  Term Goals  Verbalizes importance of monitoring SPO2 with pulse oximeter and return demonstration;Maintenance of O2 saturations>88%;Exhibits proper breathing techniques, such as pursed lip breathing or other method taught during program session;Compliance with respiratory medication;Demonstrates proper use of MDI's    Comments  Patient has been trying to practice with his PLB. He takes his inhalers regularly and has no questions on his medications. He states that he has  a spacer and have reviewed with patient how to use it. He wears no oxygen at home or CPAP when he sleeps.    Goals/Expected Outcomes  Short: check oxygen  at home when on exertion. Long: maintain oxygen saturations above 88 percent independently.       Initial Exercise Prescription: Initial Exercise Prescription - 09/24/18 1200      Date of Initial Exercise RX and Referring Provider   Date  09/24/18    Referring Provider  Ramachandran      Treadmill   MPH  1    Grade  0    Minutes  15    METs  1.7      NuStep   Level  1    SPM  80    Minutes  15    METs  1.5      Biostep-RELP   Level  2    SPM  50    Minutes  15    METs  1.5      Prescription Details   Frequency (times per week)  3    Duration  Progress to 45 minutes of aerobic exercise without signs/symptoms of physical distress      Intensity   THRR 40-80% of Max Heartrate  85-117    Ratings of Perceived Exertion  11-15    Perceived Dyspnea  0-4      Resistance Training   Training Prescription  Yes    Weight  3 lb    Reps  10-15       Perform Capillary Blood Glucose checks as needed.  Exercise Prescription Changes: Exercise Prescription Changes    Row Name 10/09/18 1200 10/23/18 1200 11/06/18 1100         Response to Exercise   Blood Pressure (Admit)  110/60  148/84  118/62     Blood Pressure (Exit)  124/70  126/60  116/68     Heart Rate (Admit)  66 bpm  82 bpm  78 bpm     Heart Rate (Exercise)  85 bpm  95 bpm  92 bpm     Heart Rate (Exit)  63 bpm  66 bpm  67 bpm     Oxygen Saturation (Admit)  96 %  93 %  96 %     Oxygen Saturation (Exercise)  93 %  96 %  92 %     Oxygen Saturation (Exit)  97 %  97 %  97 %     Rating of Perceived Exertion (Exercise)  '12  12  12     ' Perceived Dyspnea (Exercise)  '3  3  3     ' Symptoms  none  none  none     Duration  Continue with 45 min of aerobic exercise without signs/symptoms of physical distress.  Continue with 45 min of aerobic exercise without signs/symptoms of  physical distress.  Continue with 45 min of aerobic exercise without signs/symptoms of physical distress.     Intensity  THRR unchanged  THRR unchanged  THRR unchanged       Progression   Progression  Continue to progress workloads to maintain intensity without signs/symptoms of physical distress.  Continue to progress workloads to maintain intensity without signs/symptoms of physical distress.  Continue to progress workloads to maintain intensity without signs/symptoms of physical distress.     Average METs  '2  2  2       ' Resistance Training   Training Prescription  Yes  Yes  Yes     Weight  3 lb  3 lb  3 lb     Reps  10-15  10-15  10-15       Interval  Training   Interval Training  No  No  No       Treadmill   MPH  '1  1  1     ' Grade  0  0  0     Minutes  '15  15  15     ' METs  1.77  1.77  1.77       NuStep   Level  -  -  1     SPM  -  -  80     Minutes  -  -  15     METs  -  -  2.2       Biostep-RELP   Level  '2  2  2     ' SPM  50  50  50     Minutes  '15  15  15     ' METs  '2  2  2       ' Home Exercise Plan   Plans to continue exercise at  -  [employer]     Frequency  -  Add 1 additional day to program exercise sessions.  Add 1 additional day to program exercise sessions.     Initial Home Exercises Provided  -  10/23/18  10/23/18        Exercise Comments: Exercise Comments    Row Name 09/30/18 1013 10/23/18 1042         Exercise Comments   First full day of exercise!  Patient was oriented to gym and equipment including functions, settings, policies, and procedures.  Patient's individual exercise prescription and treatment plan were reviewed.  All starting workloads were established based on the results of the 6 minute walk test done at initial orientation visit.  The plan for exercise progression was also introduced and progression will be customized based on patient's performance and goals.   Reviewed home exercise with pt today.  Pt plans to walk and join  [employer] for exercise.  Reviewed THR, pulse, RPE, sign and symptoms, NTG use, and when to call 911 or MD.  Also discussed weather considerations and indoor options.  Pt voiced understanding.         Exercise Goals and Review: Exercise Goals    Row Name 09/24/18 1221             Exercise Goals   Increase Physical Activity  Yes       Intervention  Provide advice, education, support and counseling about physical activity/exercise needs.;Develop an individualized exercise prescription for aerobic and resistive training based on initial evaluation findings, risk stratification, comorbidities and participant's personal goals.       Expected Outcomes  Short Term: Attend rehab on a regular basis to increase amount of physical activity.;Long Term: Add in home exercise to make exercise part of routine and to increase amount of physical activity.;Long Term: Exercising regularly at least 3-5 days a week.       Increase Strength and Stamina  Yes       Intervention  Provide advice, education, support and counseling about physical activity/exercise needs.;Develop an individualized exercise prescription for aerobic and resistive training based on initial evaluation findings, risk stratification, comorbidities and participant's personal goals.       Expected Outcomes  Short Term: Increase workloads from initial exercise prescription for resistance, speed, and METs.;Short Term: Perform resistance training exercises routinely during rehab and add in resistance training at home;Long Term: Improve cardiorespiratory fitness, muscular endurance and strength as  measured by increased METs and functional capacity (6MWT)       Able to understand and use rate of perceived exertion (RPE) scale  Yes       Intervention  Provide education and explanation on how to use RPE scale       Expected Outcomes  Short Term: Able to use RPE daily in rehab to express subjective intensity level;Long Term:  Able to use RPE to guide  intensity level when exercising independently       Able to understand and use Dyspnea scale  Yes       Intervention  Provide education and explanation on how to use Dyspnea scale       Expected Outcomes  Short Term: Able to use Dyspnea scale daily in rehab to express subjective sense of shortness of breath during exertion;Long Term: Able to use Dyspnea scale to guide intensity level when exercising independently       Knowledge and understanding of Target Heart Rate Range (THRR)  Yes       Intervention  Provide education and explanation of THRR including how the numbers were predicted and where they are located for reference       Expected Outcomes  Short Term: Able to state/look up THRR;Short Term: Able to use daily as guideline for intensity in rehab;Long Term: Able to use THRR to govern intensity when exercising independently       Able to check pulse independently  Yes       Intervention  Provide education and demonstration on how to check pulse in carotid and radial arteries.;Review the importance of being able to check your own pulse for safety during independent exercise       Expected Outcomes  Long Term: Able to check pulse independently and accurately;Short Term: Able to explain why pulse checking is important during independent exercise       Understanding of Exercise Prescription  Yes       Intervention  Provide education, explanation, and written materials on patient's individual exercise prescription       Expected Outcomes  Short Term: Able to explain program exercise prescription;Long Term: Able to explain home exercise prescription to exercise independently          Exercise Goals Re-Evaluation : Exercise Goals Re-Evaluation    Row Name 09/30/18 1013 10/09/18 1238 10/23/18 1042 11/06/18 1142       Exercise Goal Re-Evaluation   Exercise Goals Review  Increase Physical Activity;Increase Strength and Stamina;Able to understand and use rate of perceived exertion (RPE) scale;Able  to understand and use Dyspnea scale;Knowledge and understanding of Target Heart Rate Range (THRR);Understanding of Exercise Prescription  Increase Physical Activity;Increase Strength and Stamina;Able to understand and use rate of perceived exertion (RPE) scale;Able to understand and use Dyspnea scale;Knowledge and understanding of Target Heart Rate Range (THRR)  Increase Physical Activity;Able to understand and use rate of perceived exertion (RPE) scale;Knowledge and understanding of Target Heart Rate Range (THRR);Understanding of Exercise Prescription;Increase Strength and Stamina;Able to understand and use Dyspnea scale;Able to check pulse independently  Increase Physical Activity;Increase Strength and Stamina;Able to understand and use rate of perceived exertion (RPE) scale;Able to understand and use Dyspnea scale;Knowledge and understanding of Target Heart Rate Range (THRR)    Comments  Reviewed RPE scale, THR and program prescription with pt today.  Pt voiced understanding and was given a copy of goals to take home.  Donat is in his second full week of exercise.  He is tolerating exercise well.  Staff will monitor progress.   Reviewed home exercise with pt today.  Pt plans to walk and join [employer] for exercise.  Reviewed THR, pulse, RPE, sign and symptoms, NTG use, and when to call 911 or MD.  Also discussed weather considerations and indoor options.  Pt voiced understanding.  Ivo tolerates exercise well.  He has missed sessions due to illness.  Staff will encourage increase on TM.    Expected Outcomes  Short: Use RPE daily to regulate intensity. Long: Follow program prescription in THR.  Short - attend 3 days per week Long - increase overall MET level  Short - add one day per week at home walking Long - join [employer] and maintain exercise   Short - increase incline on TM Long - increase MET level       Discharge Exercise Prescription (Final Exercise Prescription Changes): Exercise Prescription  Changes - 11/06/18 1100      Response to Exercise   Blood Pressure (Admit)  118/62    Blood Pressure (Exit)  116/68    Heart Rate (Admit)  78 bpm    Heart Rate (Exercise)  92 bpm    Heart Rate (Exit)  67 bpm    Oxygen Saturation (Admit)  96 %    Oxygen Saturation (Exercise)  92 %    Oxygen Saturation (Exit)  97 %    Rating of Perceived Exertion (Exercise)  12    Perceived Dyspnea (Exercise)  3    Symptoms  none    Duration  Continue with 45 min of aerobic exercise without signs/symptoms of physical distress.    Intensity  THRR unchanged      Progression   Progression  Continue to progress workloads to maintain intensity without signs/symptoms of physical distress.    Average METs  2      Resistance Training   Training Prescription  Yes    Weight  3 lb    Reps  10-15      Interval Training   Interval Training  No      Treadmill   MPH  1    Grade  0    Minutes  15    METs  1.77      NuStep   Level  1    SPM  80    Minutes  15    METs  2.2      Biostep-RELP   Level  2    SPM  50    Minutes  15    METs  2      Home Exercise Plan   Plans to continue exercise at  [employer]    Frequency  Add 1 additional day to program exercise sessions.    Initial Home Exercises Provided  10/23/18       Nutrition:  Target Goals: Understanding of nutrition guidelines, daily intake of sodium <1558m, cholesterol <2085m calories 30% from fat and 7% or less from saturated fats, daily to have 5 or more servings of fruits and vegetables.  Biometrics: Pre Biometrics - 09/24/18 1220      Pre Biometrics   Height  5' 4.5" (1.638 m)    Weight  186 lb 4.8 oz (84.5 kg)    Waist Circumference  42 inches    Hip Circumference  43 inches    Waist to Hip Ratio  0.98 %    BMI (Calculated)  31.5    Single Leg Stand  3.96 seconds  Nutrition Therapy Plan and Nutrition Goals: Nutrition Therapy & Goals - 10/07/18 1058      Nutrition Therapy   Diet  TLC    Drug/Food Interactions   Statins/Certain Fruits    Protein (specify units)  8oz    Fiber  30 grams    Whole Grain Foods  3 servings   prefers rye bread   Saturated Fats  14 max. grams    Fruits and Vegetables  6 servings/day   8 ideal; does not eat large portions but likes fruits and vegetables   Sodium  1500 grams      Personal Nutrition Goals   Nutrition Goal  Start to pay more attention to the amount of sodium you are consuming. For example, buy lower salt crackers or work with your wife to use less salt during the cooking process    Personal Goal #2  Have a glass of water near you at home to remind you to drink more during the day. Aim to drink one additional glass of water each day    Comments  He eats oatmeal for breakfast daily, 1/2 a sandwich for lunchy and has a larger dinner. He and his wife eat out for dinners half of the time he estimates at restaurants nearby. He feels his downfall is the amount of snacking he does but is trying to choose smaller portions of high-sugar options like ice cream. Does not eat much red meat as it is difficult for him to chew. He drinks mostly water and some orange juice. They buy canned items when certain fruits / vegetables are out of season. His wife currently cooks with salt especially for options like soup      Intervention Plan   Intervention  Prescribe, educate and counsel regarding individualized specific dietary modifications aiming towards targeted core components such as weight, hypertension, lipid management, diabetes, heart failure and other comorbidities.    Expected Outcomes  Long Term Goal: Adherence to prescribed nutrition plan.;Short Term Goal: A plan has been developed with personal nutrition goals set during dietitian appointment.;Short Term Goal: Understand basic principles of dietary content, such as calories, fat, sodium, cholesterol and nutrients.       Nutrition Assessments:   Nutrition Goals Re-Evaluation: Nutrition Goals Re-Evaluation    [place] Name  10/07/18 1120             Goals   Nutrition Goal  Start to pay more attention to the amount of sodium you are consuming. For example, buy lower salt crackers or work with your wife to use less salt during the cooking process       Comment  He does not monitor his sodium intake at this time. He either eats meals provided by his wife or they eat out as a couple       Expected Outcome  He will buy lower salt snack foods and/or not add salt to meals at home to help reduce overall sodium intake         Personal Goal #2 Re-Evaluation   Personal Goal #2  Have a glass of water near you at home to remind you to drink more during the day. Aim to drink one additional glass of water each day          Nutrition Goals Discharge (Final Nutrition Goals Re-Evaluation): Nutrition Goals Re-Evaluation - 10/07/18 1120      Goals   Nutrition Goal  Start to pay more attention to the amount of sodium you are consuming. For  example, buy lower salt crackers or work with your wife to use less salt during the cooking process    Comment  He does not monitor his sodium intake at this time. He either eats meals provided by his wife or they eat out as a couple    Expected Outcome  He will buy lower salt snack foods and/or not add salt to meals at home to help reduce overall sodium intake      Personal Goal #2 Re-Evaluation   Personal Goal #2  Have a glass of water near you at home to remind you to drink more during the day. Aim to drink one additional glass of water each day       Psychosocial: Target Goals: Acknowledge presence or absence of significant depression and/or stress, maximize coping skills, provide positive support system. Participant is able to verbalize types and ability to use techniques and skills needed for reducing stress and depression.   Initial Review & Psychosocial Screening: Initial Psych Review & Screening - 09/24/18 1203      Initial Review   Current issues with  None Identified       Family Dynamics   Good Support System?  Yes    Comments  His wife of 63 years is a good support system      Barriers   Psychosocial barriers to participate in program  There are no identifiable barriers or psychosocial needs.;The patient should benefit from training in stress management and relaxation.      Screening Interventions   Interventions  Encouraged to exercise    Expected Outcomes  Short Term goal: Utilizing psychosocial counselor, staff and physician to assist with identification of specific Stressors or current issues interfering with healing process. Setting desired goal for each stressor or current issue identified.;Long Term Goal: Stressors or current issues are controlled or eliminated.;Short Term goal: Identification and review with participant of any Quality of Life or Depression concerns found by scoring the questionnaire.;Long Term goal: The participant improves quality of Life and PHQ9 Scores as seen by post scores and/or verbalization of changes       Quality of Life Scores:  Scores of 19 and below usually indicate a poorer quality of life in these areas.  A difference of  2-3 points is a clinically meaningful difference.  A difference of 2-3 points in the total score of the Quality of Life Index has been associated with significant improvement in overall quality of life, self-image, physical symptoms, and general health in studies assessing change in quality of life.  PHQ-9: Recent Review Flowsheet Data    Depression screen [facility] 2/9 09/24/2018 05/08/2018 02/13/2018 02/09/2017 10/24/2016   Decreased Interest 0 0 2 0 0   Down, Depressed, Hopeless 0 0 2 0 0   PHQ - 2 Score 0 0 4 0 0   Altered sleeping 0 - '2 2 1   ' Tired, decreased energy 1 - '2 1 2   ' Change in appetite 2 - 2 0 0   Feeling bad or failure about yourself  0 - 2 0 2    Trouble concentrating 0 - 0 0 1   Moving slowly or fidgety/restless 0 - 0 0 0   Suicidal thoughts 0 - 0 0 0   PHQ-9 Score 3 - '12 3 6   ' Difficult  doing work/chores Not difficult at all - Very difficult Not difficult at all Somewhat difficult     Interpretation of Total Score  Total Score Depression Severity:  1-4 = Minimal depression, 5-9 = Mild depression, 10-14 = Moderate depression, 15-19 = Moderately severe depression, 20-27 = Severe depression   Psychosocial Evaluation and Intervention: Psychosocial Evaluation - 10/07/18 1059      Psychosocial Evaluation & Interventions   Interventions  Encouraged to exercise with the program and follow exercise prescription    Comments  Counselor met with Mr. Greenley Googe) today for an initial psychosocial evaluation.  He was in this program several years ago and has since had a bout with lung cancer and radiation earlier this year.  He continues to live with his wife of 74 years and is involved in his local church.  He reports being in fairly good health otherwise - but had difficulty hearing at times.  Ethin reports sleeping "okay" most of the time with the help of some medication, and he has a good appetite.  He denies a history of depression or anxiety or any current symptoms and reports being "happy" and in a good mood most of the time.  Diar has goals to improve his breathing while in this program and regain his energy subsequent to the radiation treatments.  Staff will follow with Daeron throughout the course of this program.       Expected Outcomes  Short:  Cayden will exercise to regain his energy and to hopefully improve his ability to breathe better.   Long:  Vitaly will continue to exercise for his health and his mental health.     Continue Psychosocial Services   Follow up required by staff       Psychosocial Re-Evaluation: Psychosocial Re-Evaluation    [place] Name 10/23/18 1430             Psychosocial Re-Evaluation   Current issues with  None Identified       Comments  Patient states he has no issues to report and has been doing well mentaly. He states everything has been good at home  with is wife of 49 years.       Expected Outcomes  Short: Attend [facility] stress management education to decrease stress. Long: Maintain exercise Post [facility] to keep stress at a minimum.       Interventions  Encouraged to attend Pulmonary Rehabilitation for the exercise       Continue Psychosocial Services   Follow up required by staff          Psychosocial Discharge (Final Psychosocial Re-Evaluation): Psychosocial Re-Evaluation - 10/23/18 1430      Psychosocial Re-Evaluation   Current issues with  None Identified    Comments  Patient states he has no issues to report and has been doing well mentaly. He states everything has been good at home with is wife of 19 years.    Expected Outcomes  Short: Attend [facility] stress management education to decrease stress. Long: Maintain exercise Post [facility] to keep stress at a minimum.    Interventions  Encouraged to attend Pulmonary Rehabilitation for the exercise    Continue Psychosocial Services   Follow up required by staff       Education: Education Goals: Education classes will be provided on a weekly basis, covering required topics. Participant will state understanding/return demonstration of topics presented.  Learning Barriers/Preferences: Learning Barriers/Preferences - 09/24/18 1121      Learning Barriers/Preferences   Learning Barriers  Hearing    Learning Preferences  None       Education Topics:  Initial Evaluation Education: - Verbal, written and demonstration of respiratory meds, oximetry  and breathing techniques. Instruction on use of nebulizers and MDIs and importance of monitoring MDI activations.   Pulmonary Rehab from 11/01/2018 in [facility] Cardiac and Pulmonary Rehab  Date  09/24/18  Educator  [facility]  Instruction Review Code  1- Verbalizes Understanding      General Nutrition Guidelines/Fats and Fiber: -Group instruction provided by verbal, written material, models and posters to present the general guidelines for  heart healthy nutrition. Gives an explanation and review of dietary fats and fiber.   Pulmonary Rehab from 11/01/2018 in [facility] Cardiac and Pulmonary Rehab  Date  10/23/18  Educator  LB  Instruction Review Code  1- Verbalizes Understanding      Controlling Sodium/Reading Food Labels: -Group verbal and written material supporting the discussion of sodium use in heart healthy nutrition. Review and explanation with models, verbal and written materials for utilization of the food label.   Pulmonary Rehab from 02/21/2017 in [facility] Cardiac and Pulmonary Rehab  Date  01/08/17  Educator  CR  Instruction Review Code (retired)  2- meets goals/outcomes      Exercise Physiology & General Exercise Guidelines: - Group verbal and written instruction with models to review the exercise physiology of the cardiovascular system and associated critical values. Provides general exercise guidelines with specific guidelines to those with heart or lung disease.    Pulmonary Rehab from 02/21/2017 in [facility] Cardiac and Pulmonary Rehab  Date  12/20/16  Educator  [facility]  Instruction Review Code (retired)  2- meets goals/outcomes      Aerobic Exercise & Resistance Training: - Gives group verbal and written instruction on the various components of exercise. Focuses on aerobic and resistive training programs and the benefits of this training and how to safely progress through these programs.   Pulmonary Rehab from 02/21/2017 in [facility] Cardiac and Pulmonary Rehab  Date  01/17/17  Educator  [facility]  Instruction Review Code (retired)  2- Statistician, Balance, Mind/Body Relaxation: Provides group verbal/written instruction on the benefits of flexibility and balance training, including mind/body exercise modes such as yoga, pilates and tai chi.  Demonstration and skill practice provided.   Pulmonary Rehab from 11/01/2018 in [facility] Cardiac and Pulmonary Rehab  Date  10/09/18  Educator  AS  Instruction Review Code   1- Verbalizes Understanding      Stress and Anxiety: - Provides group verbal and written instruction about the health risks of elevated stress and causes of high stress.  Discuss the correlation between heart/lung disease and anxiety and treatment options. Review healthy ways to manage with stress and anxiety.   Pulmonary Rehab from 11/01/2018 in [facility] Cardiac and Pulmonary Rehab  Date  10/30/18  Educator  [facility]  Instruction Review Code  1- Verbalizes Understanding      Depression: - Provides group verbal and written instruction on the correlation between heart/lung disease and depressed mood, treatment options, and the stigmas associated with seeking treatment.   Pulmonary Rehab from 11/01/2018 in [facility] Cardiac and Pulmonary Rehab  Date  10/02/18  Educator  [facility]  Instruction Review Code  1- Verbalizes Understanding      Exercise & Equipment Safety: - Individual verbal instruction and demonstration of equipment use and safety with use of the equipment.   Pulmonary Rehab from 11/01/2018 in [facility] Cardiac and Pulmonary Rehab  Date  09/24/18  Educator  [facility]  Instruction Review Code  1- Verbalizes Understanding      Infection Prevention: - Provides verbal and written material to individual with  discussion of infection control including proper hand washing and proper equipment cleaning during exercise session.   Pulmonary Rehab from 11/01/2018 in [facility] Cardiac and Pulmonary Rehab  Date  09/24/18  Educator  [facility]  Instruction Review Code  1- Verbalizes Understanding      Falls Prevention: - Provides verbal and written material to individual with discussion of falls prevention and safety.   Pulmonary Rehab from 11/01/2018 in [facility] Cardiac and Pulmonary Rehab  Date  09/24/18  Educator  [facility]  Instruction Review Code  1- Verbalizes Understanding      Diabetes: - Individual verbal and written instruction to review signs/symptoms of diabetes, desired ranges of glucose level fasting, after meals  and with exercise. Advice that pre and post exercise glucose checks will be done for 3 sessions at entry of program.   Chronic Lung Diseases: - Group verbal and written instruction to review updates, respiratory medications, advancements in procedures and treatments. Discuss use of supplemental oxygen including available portable oxygen systems, continuous and intermittent flow rates, concentrators, personal use and safety guidelines. Review proper use of inhaler and spacers. Provide informative websites for self-education.    Pulmonary Rehab from 02/21/2017 in [facility] Cardiac and Pulmonary Rehab  Date  01/10/17  Educator  LB  Instruction Review Code (retired)  2- meets Chief Financial Officer: - Provide group verbal and written instruction for methods to conserve energy, plan and organize activities. Instruct on pacing techniques, use of adaptive equipment and posture/positioning to relieve shortness of breath.   Triggers and Exacerbations: - Group verbal and written instruction to review types of environmental triggers and ways to prevent exacerbations. Discuss weather changes, air quality and the benefits of nasal washing. Review warning signs and symptoms to help prevent infections. Discuss techniques for effective airway clearance, coughing, and vibrations.   Pulmonary Rehab from 02/21/2017 in [facility] Cardiac and Pulmonary Rehab  Date  02/07/17  Educator  LB  Instruction Review Code (retired)  2- meets goals/outcomes      AED/CPR: - Group verbal and written instruction with the use of models to demonstrate the basic use of the AED with the basic ABC's of resuscitation.   Anatomy and Physiology of the Lungs: - Group verbal and written instruction with the use of models to provide basic lung anatomy and physiology related to function, structure and complications of lung disease.   Pulmonary Rehab from 11/01/2018 in [facility] Cardiac and Pulmonary Rehab  Date  11/01/18  Educator   [facility]  Instruction Review Code  1- Verbalizes Understanding      Anatomy & Physiology of the Heart: - Group verbal and written instruction and models provide basic cardiac anatomy and physiology, with the coronary electrical and arterial systems. Review of Valvular disease and Heart Failure   Pulmonary Rehab from 02/21/2017 in [facility] Cardiac and Pulmonary Rehab  Date  02/02/17  Educator  CE  Instruction Review Code (retired)  2- meets goals/outcomes      Cardiac Medications: - Group verbal and written instruction to review commonly prescribed medications for heart disease. Reviews the medication, class of the drug, and side effects.   Know Your Numbers and Risk Factors: -Group verbal and written instruction about important numbers in your health.  Discussion of what are risk factors and how they play a role in the disease process.  Review of Cholesterol, Blood Pressure, Diabetes, and BMI and the role they play in your overall health.   Sleep Hygiene: -Provides group verbal and written instruction  about how sleep can affect your health.  Define sleep hygiene, discuss sleep cycles and impact of sleep habits. Review good sleep hygiene tips.    Other: -Provides group and verbal instruction on various topics (see comments)    Knowledge Questionnaire Score: Knowledge Questionnaire Score - 09/24/18 1135      Knowledge Questionnaire Score   Pre Score  12/18   REVIEWED with patient       Core Components/Risk Factors/Patient Goals at Admission: Personal Goals and Risk Factors at Admission - 09/24/18 1205      Core Components/Risk Factors/Patient Goals on Admission    Weight Management  Yes;Weight Loss    Intervention  Weight Management: Develop a combined nutrition and exercise program designed to reach desired caloric intake, while maintaining appropriate intake of nutrient and fiber, sodium and fats, and appropriate energy expenditure required for the weight goal.;Weight Management:  Provide education and appropriate resources to help participant work on and attain dietary goals.;Weight Management/Obesity: Establish reasonable short term and long term weight goals.    Admit Weight  186 lb 4.8 oz (84.5 kg)    Goal Weight: Short Term  181 lb (82.1 kg)    Goal Weight: Long Term  175 lb (79.4 kg)    Expected Outcomes  Short Term: Continue to assess and modify interventions until short term weight is achieved;Long Term: Adherence to nutrition and physical activity/exercise program aimed toward attainment of established weight goal;Weight Loss: Understanding of general recommendations for a balanced deficit meal plan, which promotes 1-2 lb weight loss per week and includes a negative energy balance of [phone] kcal/d;Understanding recommendations for meals to include 15-35% energy as protein, 25-35% energy from fat, 35-60% energy from carbohydrates, less than 244m of dietary cholesterol, 20-35 gm of total fiber daily;Understanding of distribution of calorie intake throughout the day with the consumption of 4-5 meals/snacks    Improve shortness of breath with ADL's  Yes    Intervention  Provide education, individualized exercise plan and daily activity instruction to help decrease symptoms of SOB with activities of daily living.    Expected Outcomes  Short Term: Improve cardiorespiratory fitness to achieve a reduction of symptoms when performing ADLs;Long Term: Be able to perform more ADLs without symptoms or delay the onset of symptoms    Hypertension  Yes    Intervention  Provide education on lifestyle modifcations including regular physical activity/exercise, weight management, moderate sodium restriction and increased consumption of fresh fruit, vegetables, and low fat dairy, alcohol moderation, and smoking cessation.;Monitor prescription use compliance.    Expected Outcomes  Short Term: Continued assessment and intervention until BP is < 140/93mHG in hypertensive participants. <  130/804mG in hypertensive participants with diabetes, heart failure or chronic kidney disease.;Long Term: Maintenance of blood pressure at goal levels.    Lipids  Yes    Intervention  Provide education and support for participant on nutrition & aerobic/resistive exercise along with prescribed medications to achieve LDL <93m78mDL >40mg30m Expected Outcomes  Short Term: Participant states understanding of desired cholesterol values and is compliant with medications prescribed. Participant is following exercise prescription and nutrition guidelines.;Long Term: Cholesterol controlled with medications as prescribed, with individualized exercise RX and with personalized nutrition plan. Value goals: LDL < 93mg,44m > 40 mg.       Core Components/Risk Factors/Patient Goals Review:  Goals and Risk Factor Review    [place] Name 10/23/18 1433             Core  Components/Risk Factors/Patient Goals Review   Personal Goals Review  Weight Management/Obesity;Lipids;Hypertension;Improve shortness of breath with ADL's       Review  Patient has been checking his blood pressure at home everyday. He takes his medications regularly and has no questions on the,. When he gets short of breath he sits down and tries to catch his breath. His wife has him use a push mower for exercise. He states he still does some yard work. He wants to lose some more weight but states his wife is the cook and he eats what she makes. He states that he can do most of his daily activities but it takes him a little more time.       Expected Outcomes  Short: Attend [facility] regularly to improve shortness of breath with ADL's. Long: maintain independence with ADL's           Core Components/Risk Factors/Patient Goals at Discharge (Final Review):  Goals and Risk Factor Review - 10/23/18 1433      Core Components/Risk Factors/Patient Goals Review   Personal Goals Review  Weight Management/Obesity;Lipids;Hypertension;Improve shortness of  breath with ADL's    Review  Patient has been checking his blood pressure at home everyday. He takes his medications regularly and has no questions on the,. When he gets short of breath he sits down and tries to catch his breath. His wife has him use a push mower for exercise. He states he still does some yard work. He wants to lose some more weight but states his wife is the cook and he eats what she makes. He states that he can do most of his daily activities but it takes him a little more time.    Expected Outcomes  Short: Attend [facility] regularly to improve shortness of breath with ADL's. Long: maintain independence with ADL's        ITP Comments: ITP Comments    [place] Name 09/24/18 1120 10/07/18 0821 11/04/18 0825 11/20/18 1156 11/27/18 1148   ITP Comments  Medical Evaluation completed. Chart sent for review and changes to Dr. Emily Filbert Director of [facility]. Diagnosis can be found in [facility] encounter 09/03/18.  30 day review completed. ITP sent to Dr. Emily Filbert Director of [facility]. Continue with ITP unless changes are made by physician.  30 day review completed. ITP sent to Dr. Emily Filbert Director of [place]. Continue with ITP unless changes are made by physician.  Nashaun has been out with hip pain but plans to return Friday 12/6.  Called patient to see if he was ready to come back to [place]. Patient has not been to rehab in about a month. Patient states he is not ready to come back and with the holidays coming it would be awhile yet. Informed patient of discharge. Patient verbalizes understanding.   [place] Name 11/27/18 1149           ITP Comments  Discharge ITP sent and signed by Dr. Sabra Heck.  Discharge Summary routed to PCP and pulmonologist.          Comments: Discharge ITP

## 2018-12-02 ENCOUNTER — Other Ambulatory Visit: Payer: Self-pay

## 2018-12-02 ENCOUNTER — Observation Stay
Admission: EM | Admit: 2018-12-02 | Discharge: 2018-12-04 | Disposition: A | Discharge status: Home / Self Care | Payer: Medicare Other | Attending: Internal Medicine | Admitting: Internal Medicine

## 2018-12-02 ENCOUNTER — Telehealth: Payer: Self-pay

## 2018-12-02 ENCOUNTER — Encounter: Payer: Self-pay | Admitting: Emergency Medicine

## 2018-12-02 ENCOUNTER — Emergency Department: Payer: Medicare Other

## 2018-12-02 DIAGNOSIS — M109 Gout, unspecified: Secondary | ICD-10-CM | POA: Diagnosis not present

## 2018-12-02 DIAGNOSIS — Z8546 Personal history of malignant neoplasm of prostate: Secondary | ICD-10-CM | POA: Insufficient documentation

## 2018-12-02 DIAGNOSIS — I081 Rheumatic disorders of both mitral and tricuspid valves: Secondary | ICD-10-CM | POA: Diagnosis not present

## 2018-12-02 DIAGNOSIS — E785 Hyperlipidemia, unspecified: Secondary | ICD-10-CM | POA: Insufficient documentation

## 2018-12-02 DIAGNOSIS — E871 Hypo-osmolality and hyponatremia: Secondary | ICD-10-CM | POA: Diagnosis present

## 2018-12-02 DIAGNOSIS — I493 Ventricular premature depolarization: Secondary | ICD-10-CM | POA: Diagnosis not present

## 2018-12-02 DIAGNOSIS — Z951 Presence of aortocoronary bypass graft: Secondary | ICD-10-CM | POA: Insufficient documentation

## 2018-12-02 DIAGNOSIS — I480 Paroxysmal atrial fibrillation: Secondary | ICD-10-CM | POA: Insufficient documentation

## 2018-12-02 DIAGNOSIS — I5023 Acute on chronic systolic (congestive) heart failure: Secondary | ICD-10-CM | POA: Diagnosis present

## 2018-12-02 DIAGNOSIS — Z79899 Other long term (current) drug therapy: Secondary | ICD-10-CM | POA: Diagnosis not present

## 2018-12-02 DIAGNOSIS — I2510 Atherosclerotic heart disease of native coronary artery without angina pectoris: Secondary | ICD-10-CM | POA: Insufficient documentation

## 2018-12-02 DIAGNOSIS — Z87891 Personal history of nicotine dependence: Secondary | ICD-10-CM | POA: Insufficient documentation

## 2018-12-02 DIAGNOSIS — Z7951 Long term (current) use of inhaled steroids: Secondary | ICD-10-CM | POA: Insufficient documentation

## 2018-12-02 DIAGNOSIS — K219 Gastro-esophageal reflux disease without esophagitis: Secondary | ICD-10-CM | POA: Insufficient documentation

## 2018-12-02 DIAGNOSIS — I10 Essential (primary) hypertension: Secondary | ICD-10-CM | POA: Diagnosis present

## 2018-12-02 DIAGNOSIS — N400 Enlarged prostate without lower urinary tract symptoms: Secondary | ICD-10-CM | POA: Diagnosis not present

## 2018-12-02 DIAGNOSIS — I454 Nonspecific intraventricular block: Secondary | ICD-10-CM | POA: Insufficient documentation

## 2018-12-02 DIAGNOSIS — J449 Chronic obstructive pulmonary disease, unspecified: Secondary | ICD-10-CM | POA: Insufficient documentation

## 2018-12-02 DIAGNOSIS — K5790 Diverticulosis of intestine, part unspecified, without perforation or abscess without bleeding: Secondary | ICD-10-CM | POA: Diagnosis not present

## 2018-12-02 DIAGNOSIS — N179 Acute kidney failure, unspecified: Secondary | ICD-10-CM | POA: Diagnosis not present

## 2018-12-02 DIAGNOSIS — M1990 Unspecified osteoarthritis, unspecified site: Secondary | ICD-10-CM | POA: Diagnosis not present

## 2018-12-02 DIAGNOSIS — Z923 Personal history of irradiation: Secondary | ICD-10-CM | POA: Insufficient documentation

## 2018-12-02 DIAGNOSIS — R079 Chest pain, unspecified: Secondary | ICD-10-CM | POA: Diagnosis not present

## 2018-12-02 DIAGNOSIS — I110 Hypertensive heart disease with heart failure: Secondary | ICD-10-CM | POA: Insufficient documentation

## 2018-12-02 DIAGNOSIS — Z981 Arthrodesis status: Secondary | ICD-10-CM | POA: Insufficient documentation

## 2018-12-02 DIAGNOSIS — J9811 Atelectasis: Secondary | ICD-10-CM | POA: Insufficient documentation

## 2018-12-02 DIAGNOSIS — Z8673 Personal history of transient ischemic attack (TIA), and cerebral infarction without residual deficits: Secondary | ICD-10-CM | POA: Insufficient documentation

## 2018-12-02 DIAGNOSIS — C3431 Malignant neoplasm of lower lobe, right bronchus or lung: Secondary | ICD-10-CM | POA: Insufficient documentation

## 2018-12-02 DIAGNOSIS — Z7901 Long term (current) use of anticoagulants: Secondary | ICD-10-CM | POA: Insufficient documentation

## 2018-12-02 DIAGNOSIS — R1011 Right upper quadrant pain: Secondary | ICD-10-CM

## 2018-12-02 DIAGNOSIS — I5022 Chronic systolic (congestive) heart failure: Secondary | ICD-10-CM | POA: Diagnosis not present

## 2018-12-02 DIAGNOSIS — J441 Chronic obstructive pulmonary disease with (acute) exacerbation: Secondary | ICD-10-CM | POA: Diagnosis present

## 2018-12-02 LAB — BASIC METABOLIC PANEL
Anion gap: 8 (ref 5–15)
Anion gap: 6 (ref 5–15)
BUN: 24 mg/dL — ABNORMAL HIGH (ref 8–23)
BUN: 24 mg/dL — ABNORMAL HIGH (ref 8–23)
CO2: 25 mmol/L (ref 22–32)
CO2: 26 mmol/L (ref 22–32)
Calcium: 8.1 mg/dL — ABNORMAL LOW (ref 8.9–10.3)
Calcium: 8 mg/dL — ABNORMAL LOW (ref 8.9–10.3)
Chloride: 93 mmol/L — ABNORMAL LOW (ref 98–111)
Chloride: 94 mmol/L — ABNORMAL LOW (ref 98–111)
Creatinine, Ser: 1.72 mg/dL — ABNORMAL HIGH (ref 0.61–1.24)
Creatinine, Ser: 1.51 mg/dL — ABNORMAL HIGH (ref 0.61–1.24)
GFR calc Af Amer: 41 mL/min — ABNORMAL LOW (ref >60)
GFR calc Af Amer: 47 mL/min — ABNORMAL LOW (ref >60)
GFR calc non Af Amer: 35 mL/min — ABNORMAL LOW (ref >60)
GFR calc non Af Amer: 41 mL/min — ABNORMAL LOW (ref >60)
Glucose, Bld: 135 mg/dL — ABNORMAL HIGH (ref 70–99)
Glucose, Bld: 127 mg/dL — ABNORMAL HIGH (ref 70–99)
Potassium: 4.4 mmol/L (ref 3.5–5.1)
Potassium: 4.2 mmol/L (ref 3.5–5.1)
Sodium: 126 mmol/L — ABNORMAL LOW (ref 135–145)
Sodium: 126 mmol/L — ABNORMAL LOW (ref 135–145)

## 2018-12-02 LAB — CBC
HCT: 38.2 % — ABNORMAL LOW (ref 39.0–52.0)
Hemoglobin: 12.6 g/dL — ABNORMAL LOW (ref 13.0–17.0)
MCH: 30.1 pg (ref 26.0–34.0)
MCHC: 33 g/dL (ref 30.0–36.0)
MCV: 91.2 fL (ref 80.0–100.0)
Platelets: 149 10*3/uL — ABNORMAL LOW (ref 150–400)
RBC: 4.19 MIL/uL — ABNORMAL LOW (ref 4.22–5.81)
RDW: 14.5 % (ref 11.5–15.5)
WBC: 5.3 10*3/uL (ref 4.0–10.5)
nRBC: 0 % (ref 0.0–0.2)

## 2018-12-02 LAB — GLUCOSE, CAPILLARY: Glucose-Capillary: 108 mg/dL — ABNORMAL HIGH (ref 70–99)

## 2018-12-02 LAB — MAGNESIUM: Magnesium: 2.2 mg/dL (ref 1.7–2.4)

## 2018-12-02 LAB — TROPONIN I
Troponin I: <0.03 ng/mL (ref <0.03)
Troponin I: <0.03 ng/mL (ref <0.03)

## 2018-12-02 MED ORDER — ONDANSETRON HCL 4 MG/2ML IJ SOLN: 4.0000 mg | Freq: Four times a day (QID) | INTRAMUSCULAR | Status: DC | PRN

## 2018-12-02 MED ORDER — SODIUM CHLORIDE 0.9 % IV BOLUS
500.0000 mL | Freq: Once | INTRAVENOUS | Status: AC
  Administered 2018-12-02: 500 mL via INTRAVENOUS

## 2018-12-02 MED ORDER — ACETAMINOPHEN 325 MG PO TABS
650.0000 mg | ORAL_TABLET | Freq: Four times a day (QID) | ORAL | Status: DC | PRN
  Administered 2018-12-02: 650 mg via ORAL
  Filled 2018-12-02: qty 2

## 2018-12-02 MED ORDER — METOPROLOL TARTRATE 50 MG PO TABS
50.0000 mg | ORAL_TABLET | Freq: Two times a day (BID) | ORAL | Status: DC
  Administered 2018-12-03 – 2018-12-04 (×4): 50 mg via ORAL
  Filled 2018-12-02 (×4): qty 1

## 2018-12-02 MED ORDER — FAMOTIDINE 20 MG PO TABS
20.0000 mg | ORAL_TABLET | Freq: Every day | ORAL | Status: DC
  Administered 2018-12-03 – 2018-12-04 (×2): 20 mg via ORAL
  Filled 2018-12-02 (×2): qty 1

## 2018-12-02 MED ORDER — PANTOPRAZOLE SODIUM 40 MG PO TBEC
40.0000 mg | DELAYED_RELEASE_TABLET | Freq: Every day | ORAL | Status: DC
  Administered 2018-12-03 – 2018-12-04 (×2): 40 mg via ORAL
  Filled 2018-12-02 (×2): qty 1

## 2018-12-02 MED ORDER — APIXABAN 5 MG PO TABS
5.0000 mg | ORAL_TABLET | Freq: Two times a day (BID) | ORAL | Status: DC
  Administered 2018-12-03 – 2018-12-04 (×4): 5 mg via ORAL
  Filled 2018-12-02 (×4): qty 1

## 2018-12-02 MED ORDER — ACETAMINOPHEN 650 MG RE SUPP: 650.0000 mg | Freq: Four times a day (QID) | RECTAL | Status: DC | PRN

## 2018-12-02 MED ORDER — ATORVASTATIN CALCIUM 20 MG PO TABS
80.0000 mg | ORAL_TABLET | Freq: Every day | ORAL | Status: DC
  Administered 2018-12-03: 80 mg via ORAL
  Filled 2018-12-02: qty 4

## 2018-12-02 MED ORDER — TAMSULOSIN HCL 0.4 MG PO CAPS
0.4000 mg | ORAL_CAPSULE | Freq: Every day | ORAL | Status: DC
  Administered 2018-12-03 – 2018-12-04 (×2): 0.4 mg via ORAL
  Filled 2018-12-02 (×2): qty 1

## 2018-12-02 MED ORDER — ALBUTEROL SULFATE (2.5 MG/3ML) 0.083% IN NEBU: 2.5000 mg | INHALATION_SOLUTION | Freq: Four times a day (QID) | RESPIRATORY_TRACT | Status: DC | PRN

## 2018-12-02 MED ORDER — LORAZEPAM 1 MG PO TABS
1.0000 mg | ORAL_TABLET | Freq: Every day | ORAL | Status: DC
  Administered 2018-12-03 (×2): 1 mg via ORAL
  Filled 2018-12-02 (×2): qty 1

## 2018-12-02 MED ORDER — SODIUM CHLORIDE 0.9 % IV SOLN
INTRAVENOUS | Status: AC
  Administered 2018-12-02: 23:00:00 via INTRAVENOUS

## 2018-12-02 MED ORDER — FLUTICASONE-UMECLIDIN-VILANT 100-62.5-25 MCG/INH IN AEPB: 1.0000 | INHALATION_SPRAY | Freq: Every day | RESPIRATORY_TRACT | Status: DC

## 2018-12-02 MED ORDER — ONDANSETRON HCL 4 MG PO TABS: 4.0000 mg | ORAL_TABLET | Freq: Four times a day (QID) | ORAL | Status: DC | PRN

## 2018-12-02 MED ORDER — MONTELUKAST SODIUM 10 MG PO TABS
10.0000 mg | ORAL_TABLET | Freq: Every day | ORAL | Status: DC
  Administered 2018-12-03 (×2): 10 mg via ORAL
  Filled 2018-12-02 (×2): qty 1

## 2018-12-02 NOTE — ED Notes (Signed)
Patient transported to X-ray 
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
## 2018-12-02 NOTE — ED Triage Notes (Signed)
Pt reports that he developed Shortness of Breath and chest pain that began Saturday. He has lung cancer on the right side of his chest where his pain is.

## 2018-12-03 ENCOUNTER — Observation Stay
Admit: 2018-12-03 | Discharge: 2018-12-03 | Disposition: A | Discharge status: Home / Self Care | Payer: Medicare Other | Attending: Internal Medicine | Admitting: Internal Medicine

## 2018-12-03 ENCOUNTER — Observation Stay: Payer: Medicare Other

## 2018-12-03 ENCOUNTER — Ambulatory Visit: Payer: Self-pay | Admitting: Family Medicine

## 2018-12-03 DIAGNOSIS — I4891 Unspecified atrial fibrillation: Secondary | ICD-10-CM | POA: Diagnosis not present

## 2018-12-03 DIAGNOSIS — E871 Hypo-osmolality and hyponatremia: Secondary | ICD-10-CM | POA: Diagnosis not present

## 2018-12-03 DIAGNOSIS — N179 Acute kidney failure, unspecified: Secondary | ICD-10-CM | POA: Diagnosis not present

## 2018-12-03 DIAGNOSIS — R1011 Right upper quadrant pain: Secondary | ICD-10-CM | POA: Diagnosis not present

## 2018-12-03 DIAGNOSIS — J449 Chronic obstructive pulmonary disease, unspecified: Secondary | ICD-10-CM | POA: Diagnosis not present

## 2018-12-03 DIAGNOSIS — I2510 Atherosclerotic heart disease of native coronary artery without angina pectoris: Secondary | ICD-10-CM | POA: Diagnosis not present

## 2018-12-03 DIAGNOSIS — R0789 Other chest pain: Secondary | ICD-10-CM | POA: Diagnosis not present

## 2018-12-03 LAB — LIPID PANEL
Cholesterol: 99 mg/dL (ref 0–200)
HDL: 55 mg/dL (ref >40)
LDL Cholesterol: 34 mg/dL (ref 0–99)
Total CHOL/HDL Ratio: 1.8 RATIO
Triglycerides: 51 mg/dL (ref <150)
VLDL: 10 mg/dL (ref 0–40)

## 2018-12-03 LAB — CBC
HCT: 36.9 % — ABNORMAL LOW (ref 39.0–52.0)
Hemoglobin: 12 g/dL — ABNORMAL LOW (ref 13.0–17.0)
MCH: 29.7 pg (ref 26.0–34.0)
MCHC: 32.5 g/dL (ref 30.0–36.0)
MCV: 91.3 fL (ref 80.0–100.0)
Platelets: 137 10*3/uL — ABNORMAL LOW (ref 150–400)
RBC: 4.04 MIL/uL — ABNORMAL LOW (ref 4.22–5.81)
RDW: 14.5 % (ref 11.5–15.5)
WBC: 4.1 10*3/uL (ref 4.0–10.5)
nRBC: 0 % (ref 0.0–0.2)

## 2018-12-03 LAB — BASIC METABOLIC PANEL
Anion gap: 7 (ref 5–15)
BUN: 19 mg/dL (ref 8–23)
CO2: 24 mmol/L (ref 22–32)
Calcium: 8.1 mg/dL — ABNORMAL LOW (ref 8.9–10.3)
Chloride: 98 mmol/L (ref 98–111)
Creatinine, Ser: 1.21 mg/dL (ref 0.61–1.24)
GFR calc Af Amer: >60 mL/min (ref >60)
GFR calc non Af Amer: 54 mL/min — ABNORMAL LOW (ref >60)
Glucose, Bld: 112 mg/dL — ABNORMAL HIGH (ref 70–99)
Potassium: 4.6 mmol/L (ref 3.5–5.1)
Sodium: 129 mmol/L — ABNORMAL LOW (ref 135–145)

## 2018-12-03 LAB — TROPONIN I
Troponin I: <0.03 ng/mL (ref <0.03)
Troponin I: 0.03 ng/mL (ref <0.03)

## 2018-12-03 MED ORDER — FLUTICASONE FUROATE-VILANTEROL 100-25 MCG/INH IN AEPB
1.0000 | INHALATION_SPRAY | Freq: Every day | RESPIRATORY_TRACT | Status: DC
  Administered 2018-12-03 – 2018-12-04 (×2): 1 via RESPIRATORY_TRACT
  Filled 2018-12-03: qty 28

## 2018-12-03 MED ORDER — SENNOSIDES-DOCUSATE SODIUM 8.6-50 MG PO TABS
1.0000 | ORAL_TABLET | Freq: Two times a day (BID) | ORAL | Status: DC
  Administered 2018-12-03 – 2018-12-04 (×3): 1 via ORAL
  Filled 2018-12-03 (×3): qty 1

## 2018-12-03 MED ORDER — TRAMADOL HCL 50 MG PO TABS: 50.0000 mg | ORAL_TABLET | Freq: Four times a day (QID) | ORAL | Status: DC | PRN

## 2018-12-03 MED ORDER — METHOCARBAMOL 500 MG PO TABS
500.0000 mg | ORAL_TABLET | Freq: Three times a day (TID) | ORAL | Status: DC
  Administered 2018-12-03 – 2018-12-04 (×3): 500 mg via ORAL
  Filled 2018-12-03 (×5): qty 1

## 2018-12-03 MED ORDER — UMECLIDINIUM BROMIDE 62.5 MCG/INH IN AEPB
1.0000 | INHALATION_SPRAY | Freq: Every day | RESPIRATORY_TRACT | Status: DC
  Administered 2018-12-03 – 2018-12-04 (×2): 1 via RESPIRATORY_TRACT
  Filled 2018-12-03: qty 7

## 2018-12-03 MED ORDER — SODIUM CHLORIDE 0.9 % IV SOLN
INTRAVENOUS | Status: AC
  Administered 2018-12-03: 16:00:00 via INTRAVENOUS

## 2018-12-03 NOTE — Care Management Note (Signed)
Case Management Note  Patient Details  Name: James [name] MRN: [id number] Date of Birth: December 13, 1929  Subjective/Objective:         Patient is from home with wife.  He has a history of lung cancer.  He is having right sided chest pain.  Troponin's negative.  Pending ECHO today.  Independent in all adls, denies issues accessing medical care, obtaining medications or with transportation.  Current with PCP.  No discharge needs identified at present by care manager or members of care team             Action/Plan:   Expected Discharge Date:                  Expected Discharge Plan:  Home/Self Care  In-House Referral:     Discharge planning Services  CM Consult  Post Acute Care Choice:    Choice offered to:     DME Arranged:    DME Agency:     HH Arranged:    [place] Agency:     Status of Service:  Completed, signed off  If discussed at [employer], dates discussed:    Additional Comments:  Elza Rafter, RN 12/03/2018, 11:36 AM

## 2018-12-03 NOTE — Plan of Care (Signed)
  Problem: Health Behavior/Discharge Planning: Goal: Ability to manage health-related needs will improve Outcome: Progressing   Problem: Pain Managment: Goal: General experience of comfort will improve Outcome: Progressing   Problem: Safety: Goal: Ability to remain free from injury will improve Outcome: Progressing   

## 2018-12-03 NOTE — Plan of Care (Signed)
  Problem: Health Behavior/Discharge Planning: Goal: Ability to manage health-related needs will improve Outcome: Progressing   Problem: Activity: Goal: Risk for activity intolerance will decrease Outcome: Progressing   Problem: Pain Managment: Goal: General experience of comfort will improve Outcome: Progressing   Problem: Safety: Goal: Ability to remain free from injury will improve Outcome: Progressing   Problem: Activity: Goal: Ability to tolerate increased activity will improve Outcome: Progressing
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
## 2018-12-03 NOTE — Care Management Obs Status (Signed)
[place] NOTIFICATION   Patient Details  Name: James [name] MRN: [id number] Date of Birth: [birth year]   Medicare Observation Status Notification Given:  Yes    Elza Rafter, RN 12/03/2018, 11:29 AM

## 2018-12-03 NOTE — Progress Notes (Addendum)
CCMD called and reported that pt have more episodes of couplets, vent bigeminy at time. Pt asymptomatic and VSS. Notify prime. Will continue to monitor.  Update 0612: Lab called for a critical lab troponin of > 0.03. Notify prime. Will continue to monitor.

## 2018-12-03 NOTE — Progress Notes (Signed)
*  PRELIMINARY RESULTS* Echocardiogram 2D Echocardiogram has been performed.  James [name] 12/03/2018, 2:28 PM

## 2018-12-04 DIAGNOSIS — J449 Chronic obstructive pulmonary disease, unspecified: Secondary | ICD-10-CM | POA: Diagnosis not present

## 2018-12-04 DIAGNOSIS — E871 Hypo-osmolality and hyponatremia: Secondary | ICD-10-CM | POA: Diagnosis not present

## 2018-12-04 DIAGNOSIS — I2510 Atherosclerotic heart disease of native coronary artery without angina pectoris: Secondary | ICD-10-CM | POA: Diagnosis not present

## 2018-12-04 DIAGNOSIS — N179 Acute kidney failure, unspecified: Secondary | ICD-10-CM | POA: Diagnosis not present

## 2018-12-04 DIAGNOSIS — R0789 Other chest pain: Secondary | ICD-10-CM | POA: Diagnosis not present

## 2018-12-04 DIAGNOSIS — R079 Chest pain, unspecified: Secondary | ICD-10-CM | POA: Diagnosis not present

## 2018-12-04 DIAGNOSIS — I4891 Unspecified atrial fibrillation: Secondary | ICD-10-CM | POA: Diagnosis not present

## 2018-12-04 LAB — ECHOCARDIOGRAM COMPLETE
Height: 68 in
Weight: 3012.8 oz

## 2018-12-04 LAB — BASIC METABOLIC PANEL
Anion gap: 4 — ABNORMAL LOW (ref 5–15)
BUN: 11 mg/dL (ref 8–23)
CO2: 28 mmol/L (ref 22–32)
Calcium: 8.5 mg/dL — ABNORMAL LOW (ref 8.9–10.3)
Chloride: 100 mmol/L (ref 98–111)
Creatinine, Ser: 0.97 mg/dL (ref 0.61–1.24)
GFR calc Af Amer: >60 mL/min (ref >60)
GFR calc non Af Amer: >60 mL/min (ref >60)
Glucose, Bld: 110 mg/dL — ABNORMAL HIGH (ref 70–99)
Potassium: 5 mmol/L (ref 3.5–5.1)
Sodium: 132 mmol/L — ABNORMAL LOW (ref 135–145)

## 2018-12-04 MED ORDER — TRAMADOL HCL 50 MG PO TABS: 50.0000 mg | ORAL_TABLET | Freq: Four times a day (QID) | ORAL | 0 refills | Status: DC | PRN

## 2018-12-04 NOTE — Discharge Summary (Signed)
[facility] NAME: James [name]    MR#:  [id number]  DATE OF BIRTH:  Mar 05, 1929  DATE OF ADMISSION:  12/02/2018   ADMITTING PHYSICIAN: Lance Coon, MD  DATE OF DISCHARGE:  12/04/18  PRIMARY CARE PHYSICIAN: Birdie Sons, MD   ADMISSION DIAGNOSIS:   Chest pain, unspecified type [R07.9]  DISCHARGE DIAGNOSIS:   Principal Problem:   Chest pain Active Problems:   BPH (benign prostatic hyperplasia)   CAD (coronary artery disease)   COPD (chronic obstructive pulmonary disease) (HCC)   Essential (primary) hypertension   GERD (gastroesophageal reflux disease)   Hyperlipidemia   PAF (paroxysmal atrial fibrillation) (HCC)   Chronic systolic heart failure (HCC)   Hyponatremia   AKI (acute kidney injury) ([place])   SECONDARY DIAGNOSIS:   Past Medical History:  Diagnosis Date  . Arthritis   . Back pain   . CAD (coronary artery disease)   . Cataract    right  . COPD (chronic obstructive pulmonary disease) ([place])    SPiriva and SYmbicort daily. Albuterol as needed  . Diverticulosis   . Dyspnea    with exertion  . Enlarged prostate    takes Flomax daily  . GERD (gastroesophageal reflux disease)   . History of chicken pox   . History of gout   . History of measles   . History of mumps   . HOH (hard of hearing)   . Hyperglycemia   . Hyperlipidemia    takes Atorvastatin daily  . Hypertension    takes Metoprolol daily as well as Lotensin HCT  . Hypotension   . Joint pain   . Microscopic colitis   . PAF (paroxysmal atrial fibrillation) ([place]), RVR 03/14/2017  . Pneumonia   . Prostate cancer ([place])   . Stroke [facility])    TIA  . TIA (transient ischemic attack)   . Weakness    numbness and tingling.mainly on right    HOSPITAL COURSE:    84 year old male with past medical history significant for stage IIb adenocarcinoma of the right lung status post radiation, CAD, COPD and hypertension presents to hospital secondary  to chest pain  1.  Chest pain-likely musculoskeletal pain as it is in the right lower chest, worsening with movement -Troponins are negative  -Echocardiogram is done and EF normal, 50-55%.  Appreciate cardiology consult -Added muscle relaxants with improvement  2.  Hyponatremia-likely hypovolemic hyponatremia,  Improved with IV fluids  3.  Acute renal insufficiency-likely secondary to prerenal causes.  Received IV fluids- nromalized  4.  History of A. fib-patient on metoprolol twice daily. -On Eliquis for anticoagulation  5.  BPH-on Flomax  6. HTN- metoprolol, benazepril -HCTZ restarted at discharge  Independent and ambulatory at baseline Discharge home today  DISCHARGE CONDITIONS:   None  CONSULTS OBTAINED:   Cardiology  DRUG ALLERGIES:   Allergies  Allergen Reactions  . Indomethacin Nausea Only   DISCHARGE MEDICATIONS:   Allergies as of 12/04/2018      Reactions   Indomethacin Nausea Only      Medication List    STOP taking these medications   HYDROcodone-acetaminophen 10-325 MG tablet Commonly known as:  Boscobel 18 MCG inhalation capsule Generic drug:  tiotropium   SYMBICORT 160-4.5 MCG/ACT inhaler Generic drug:  budesonide-formoterol     TAKE these medications   albuterol 108 (90 Base) MCG/ACT inhaler Commonly known as:  PROAIR HFA INHALE 2 PUFFS INTO LUNGS EVERY 6 HOURS AS  NEEDED FOR WHEEZING OR SHORTNESS OF BREATH   atorvastatin 80 MG tablet Commonly known as:  LIPITOR TAKE 1 TABLET BY MOUTH DAILY   azelastine 0.05 % ophthalmic solution Commonly known as:  OPTIVAR APPLY 1 DROP TO EYE TWICE DAILY What changed:  See the new instructions.   benazepril-hydrochlorthiazide 20-12.5 MG tablet Commonly known as:  LOTENSIN HCT TAKE ONE TABLET BY MOUTH EVERY DAY   beta carotene w/minerals tablet Take 1 tablet by mouth daily.   chlorpheniramine-HYDROcodone 10-8 MG/5ML Suer Commonly known as:  TUSSIONEX TAKE ONE  TEASPOONFUL (5ML) EVERY TWELVE HOURS IF NEEDED FOR COUGH What changed:  See the new instructions.   ELIQUIS 5 MG Tabs tablet Generic drug:  apixaban TAKE ONE TABLET BY MOUTH TWICE DAILY   famotidine 20 MG tablet Commonly known as:  PEPCID Take 20 mg by mouth daily.   Fish Oil 1000 MG Caps Take 1 capsule daily by mouth.   fluticasone 50 MCG/ACT nasal spray Commonly known as:  FLONASE USE 2 SPRAYS IN EACH NOSTRIL DAILY What changed:  when to take this   Fluticasone-Umeclidin-Vilant 100-62.5-25 MCG/INH Aepb Commonly known as:  TRELEGY ELLIPTA Inhale 1 applicator into the lungs daily. Rinse mouth after use. Stop symbicort and spiriva when taking this medicine.   lansoprazole 30 MG capsule Commonly known as:  PREVACID TAKE 1 CAPSULE BY MOUTH EVERY DAY   LORazepam 1 MG tablet Commonly known as:  ATIVAN 1/2 TO 1 TABLET BY MOUTH AT BEDTIME AS NEEDED FOR SLEEP What changed:  See the new instructions.   Magnesium 400 MG Caps Take 1 capsule by mouth daily.   metoprolol tartrate 50 MG tablet Commonly known as:  LOPRESSOR TAKE ONE TABLET BY MOUTH TWICE DAILY   montelukast 10 MG tablet Commonly known as:  SINGULAIR TAKE ONE TABLET EVERY DAY What changed:  when to take this   MULTIPLE VITAMIN PO Take 1 tablet by mouth daily.   tamsulosin 0.4 MG Caps capsule Commonly known as:  FLOMAX Take 1 capsule (0.4 mg total) by mouth daily.   traMADol 50 MG tablet Commonly known as:  ULTRAM Take 1 tablet (50 mg total) by mouth every 6 (six) hours as needed for moderate pain or severe pain.        DISCHARGE INSTRUCTIONS:   1. PCP f/u in 1-2 weeks 2. Cardiology f/u in 2 weeks  DIET:   Cardiac diet  ACTIVITY:   Activity as tolerated  OXYGEN:   Home Oxygen: No.  Oxygen Delivery: room air  DISCHARGE LOCATION:   home   If you experience worsening of your admission symptoms, develop shortness of breath, life threatening emergency, suicidal or homicidal thoughts you must  seek medical attention immediately by calling 911 or calling your MD immediately  if symptoms less severe.  You Must read complete instructions/literature along with all the possible adverse reactions/side effects for all the Medicines you take and that have been prescribed to you. Take any new Medicines after you have completely understood and accpet all the possible adverse reactions/side effects.   Please note  You were cared for by a hospitalist during your hospital stay. If you have any questions about your discharge medications or the care you received while you were in the hospital after you are discharged, you can call the unit and asked to speak with the hospitalist on call if the hospitalist that took care of you is not available. Once you are discharged, your primary care physician will handle any further medical issues. Please  note that NO REFILLS for any discharge medications will be authorized once you are discharged, as it is imperative that you return to your primary care physician (or establish a relationship with a primary care physician if you do not have one) for your aftercare needs so that they can reassess your need for medications and monitor your lab values.    On the day of Discharge:  VITAL SIGNS:   Blood pressure (!) 164/71, pulse 76, temperature 97.8 F (36.6 C), temperature source Oral, resp. rate 16, height 5\' 8"  (1.727 m), weight 82.8 kg, SpO2 97 %.  PHYSICAL EXAMINATION:   GENERAL:  84 y.o.-year-old patient lying in the bed with no acute distress.  EYES: Pupils equal, round, reactive to light and accommodation. No scleral icterus. Extraocular muscles intact.  HEENT: Head atraumatic, normocephalic. Oropharynx and nasopharynx clear.  NECK:  Supple, no jugular venous distention. No thyroid enlargement, no tenderness.  LUNGS: Normal breath sounds bilaterally, no wheezing, rales,rhonchi or crepitation. No use of accessory muscles of respiration.  Decreased bibasilar  breath sounds CARDIOVASCULAR: S1, S2 normal. No rubs, or gallops.  2/6 systolic murmur present ABDOMEN: Soft, nontender, no right upper quadrant tenderness, nondistended. Bowel sounds present. No organomegaly or mass.  EXTREMITIES: No pedal edema, cyanosis, or clubbing.  NEUROLOGIC: Cranial nerves II through XII are intact. Muscle strength 5/5 in all extremities. Sensation intact. Gait not checked.  PSYCHIATRIC: The patient is alert and oriented x 3.  SKIN: No obvious rash, lesion, or ulcer.  DATA REVIEW:   CBC Recent Labs  Lab 12/03/18 0507  WBC 4.1  HGB 12.0*  HCT 36.9*  PLT 137*    Chemistries  Recent Labs  Lab 12/02/18 1811  12/04/18 0500  NA 126*   < > 132*  K 4.4   < > 5.0  CL 93*   < > 100  CO2 25   < > 28  GLUCOSE 135*   < > 110*  BUN 24*   < > 11  CREATININE 1.72*   < > 0.97  CALCIUM 8.1*   < > 8.5*  MG 2.2  --   --    < > = values in this interval not displayed.     Microbiology Results  Results for orders placed or performed during the hospital encounter of 11/19/17  Culture, respiratory (NON-Expectorated)     Status: None   Collection Time: 11/19/17  2:29 PM  Result Value Ref Range Status   Specimen Description BRONCHIAL ALVEOLAR LAVAGE  Final   Special Requests NONE  Final   Gram Stain   Final    RARE WBC PRESENT, PREDOMINANTLY MONONUCLEAR NO ORGANISMS SEEN    Culture   Final    Consistent with normal respiratory flora. Performed at [facility], [place] [address], [place], [place] [address]    Report Status 11/21/2017 FINAL  Final  Acid Fast Smear (AFB)     Status: None   Collection Time: 11/19/17  2:29 PM  Result Value Ref Range Status   AFB Specimen Processing Concentration  Final   Acid Fast Smear Negative  Final    Comment: (NOTE) Performed At: [facility] [place], [place] [id number] Rush Farmer MD [id number]    Source (AFB) BRONCHIAL WASHINGS  Final  Acid Fast Culture with reflexed sensitivities      Status: None   Collection Time: 11/19/17  2:29 PM  Result Value Ref Range Status   Acid Fast Culture Negative  Final  Comment: (NOTE) No acid fast bacilli isolated after 6 weeks. Performed At: [facility] [place], [place] [id number] Rush Farmer MD [id number]    Source of Sample BRONCHIAL WASHINGS  Final    Comment: Performed at [facility], [address] [place], [place] [address]  Culture, fungus without smear     Status: Abnormal   Collection Time: 11/19/17  2:29 PM  Result Value Ref Range Status   Specimen Description   Final    BRONCHIAL ALVEOLAR LAVAGE Performed at [facility], [address], [place], [place] [address]    Special Requests   Final    NONE Performed at [facility], [address] [place], [place] [address]    Culture CANDIDA ALBICANS (A)  Final   Report Status 12/12/2017 FINAL  Final  Virus culture     Status: None   Collection Time: 11/19/17  2:29 PM  Result Value Ref Range Status   Viral Culture No virus isolated.  Corrected    Comment: (NOTE) Performed At: [facility] [address] [place], [place] [id number] Rush Farmer MD [id number] CORRECTED ON 12/11 AT 1133: PREVIOUSLY REPORTED AS Comment    Source of Sample BRONCHIAL WASHINGS  Final    RADIOLOGY:  No results found.   Management plans discussed with the patient, family and they are in agreement.  CODE STATUS:     Code Status Orders  (From admission, onward)         Start     Ordered   12/02/18 2256  Full code  Continuous     12/02/18 2255        Code Status History    Date Active Date Inactive Code Status Order ID Comments User Context   11/06/2017 1808 11/08/2017 1423 Full Code [id number]  Gorden Harms, MD Inpatient   10/30/2017 1134 10/31/2017 1801 Full Code [id number]  Demetrios Loll, MD Inpatient   03/13/2017 1820 03/14/2017 2051 Full Code [id number]  Newman Pies, MD Inpatient   03/07/2017  1849 03/08/2017 1642 Full Code [id number]  Newman Pies, MD Inpatient    Advance Directive Documentation     Most Recent Value  Type of Advance Directive  Healthcare Power of [place], Living will  Pre-existing out of facility DNR order (yellow form or pink MOST form)  -  "MOST" Form in Place?  -      TOTAL TIME TAKING CARE OF THIS PATIENT: 38 minutes.    Gladstone Lighter M.D on 12/04/2018 at 10:05 AM  Between 7am to 6pm - Pager - [phone]  After 6pm go to [email or web address] - Proofreader  [facility]  Office  [phone]  CC: Primary care physician; Birdie Sons, MD   Note: This dictation was prepared with Dragon dictation along with smaller phrase technology. Any transcriptional errors that result from this process are unintentional.

## 2018-12-04 NOTE — Progress Notes (Signed)
Patient discahrged to home with spouse.  Tele and IV d/c'd.  Patient verbalized understanding of discharge orders.  States has all belongings.  Wheeled to car.
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
## 2018-12-10 ENCOUNTER — Other Ambulatory Visit: Payer: Self-pay | Admitting: Family Medicine

## 2018-12-10 ENCOUNTER — Other Ambulatory Visit: Payer: Self-pay | Admitting: Internal Medicine

## 2018-12-12 ENCOUNTER — Encounter: Payer: Self-pay | Admitting: Family Medicine

## 2018-12-12 ENCOUNTER — Ambulatory Visit (INDEPENDENT_AMBULATORY_CARE_PROVIDER_SITE_OTHER): Payer: Medicare Other | Admitting: Family Medicine

## 2018-12-12 VITALS — BP 106/62 | HR 48 | Temp 98.2°F | Resp 16 | Ht 65.0 in | Wt 186.0 lb

## 2018-12-12 DIAGNOSIS — R0789 Other chest pain: Secondary | ICD-10-CM

## 2018-12-12 DIAGNOSIS — D649 Anemia, unspecified: Secondary | ICD-10-CM | POA: Diagnosis not present

## 2018-12-12 DIAGNOSIS — E871 Hypo-osmolality and hyponatremia: Secondary | ICD-10-CM
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
## 2018-12-12 NOTE — Patient Instructions (Signed)
Please go to the lab draw station in [address] on the second floor of [facility]  . Please bring all of your medications to every appointment so we can make sure that our medication list is the same as yours.

## 2018-12-13 LAB — RENAL FUNCTION PANEL
Albumin: 3.9 g/dL (ref 3.5–4.7)
BUN/Creatinine Ratio: 11 (ref 10–24)
BUN: 12 mg/dL (ref 8–27)
CO2: 25 mmol/L (ref 20–29)
Calcium: 9 mg/dL (ref 8.6–10.2)
Chloride: 94 mmol/L — ABNORMAL LOW (ref 96–106)
Creatinine, Ser: 1.12 mg/dL (ref 0.76–1.27)
GFR calc Af Amer: 68 mL/min/{1.73_m2} (ref >59)
GFR calc non Af Amer: 59 mL/min/{1.73_m2} — ABNORMAL LOW (ref >59)
Glucose: 109 mg/dL — ABNORMAL HIGH (ref 65–99)
Phosphorus: 3.1 mg/dL (ref 2.5–4.5)
Potassium: 4.5 mmol/L (ref 3.5–5.2)
Sodium: 131 mmol/L — ABNORMAL LOW (ref 134–144)

## 2018-12-13 LAB — CBC
Hematocrit: 37.1 % — ABNORMAL LOW (ref 37.5–51.0)
Hemoglobin: 12.5 g/dL — ABNORMAL LOW (ref 13.0–17.7)
MCH: 29.4 pg (ref 26.6–33.0)
MCHC: 33.7 g/dL (ref 31.5–35.7)
MCV: 87 fL (ref 79–97)
Platelets: 180 10*3/uL (ref 150–450)
RBC: 4.25 x10E6/uL (ref 4.14–5.80)
RDW: 13.6 % (ref 12.3–15.4)
WBC: 4.4 10*3/uL (ref 3.4–10.8)

## 2018-12-16 ENCOUNTER — Other Ambulatory Visit: Payer: Self-pay | Admitting: Family Medicine

## 2018-12-19 DIAGNOSIS — I10 Essential (primary) hypertension: Secondary | ICD-10-CM | POA: Diagnosis not present

## 2018-12-19 DIAGNOSIS — I480 Paroxysmal atrial fibrillation: Secondary | ICD-10-CM | POA: Diagnosis not present

## 2018-12-19 DIAGNOSIS — I5022 Chronic systolic (congestive) heart failure: Secondary | ICD-10-CM | POA: Diagnosis not present

## 2018-12-19 DIAGNOSIS — I2510 Atherosclerotic heart disease of native coronary artery without angina pectoris: Secondary | ICD-10-CM | POA: Diagnosis not present

## 2018-12-31 ENCOUNTER — Ambulatory Visit
Admission: RE | Admit: 2018-12-31 | Discharge: 2018-12-31 | Disposition: A | Discharge status: Home / Self Care | Payer: Medicare Other | Source: Ambulatory Visit | Attending: Radiation Oncology | Admitting: Radiation Oncology

## 2018-12-31 DIAGNOSIS — C3431 Malignant neoplasm of lower lobe, right bronchus or lung: Secondary | ICD-10-CM | POA: Insufficient documentation

## 2018-12-31 DIAGNOSIS — Z85118 Personal history of other malignant neoplasm of bronchus and lung: Secondary | ICD-10-CM | POA: Diagnosis not present

## 2018-12-31 MED ORDER — IOHEXOL 300 MG/ML  SOLN
75.0000 mL | Freq: Once | INTRAMUSCULAR | Status: AC | PRN
  Administered 2018-12-31: 75 mL via INTRAVENOUS

## 2019-01-12 NOTE — Progress Notes (Signed)
[place]  Telephone:([phone]) [phone] Fax:(336) [phone]  ID: James [name] OB: [birth year]  MR#: [id number]  UKG#:[id number]  Patient Care Team: Birdie Sons, MD as PCP - General (Family Medicine) Abbie Sons, MD (Urology) Ubaldo Glassing Javier Docker, MD as Consulting Physician (Cardiology) Telford Nab, RN as Registered Nurse  CHIEF COMPLAINT: Clinical stage IIB adenocarcinoma of the right lower lobe lung.  INTERVAL HISTORY: Patient returns to clinic today for further evaluation and discussion of his imaging results.  He currently feels well and is asymptomatic. He has no neurologic complaints.  He denies any recent fevers or illnesses.  He has a good appetite and denies weight loss.  He denies any chest pain, shortness of breath, cough, or hemoptysis.  He has no nausea, vomiting, constipation, or diarrhea.  He has no urinary complaints.  Patient feels at his baseline offers no specific complaints today.  REVIEW OF SYSTEMS:   Review of Systems  Constitutional: Negative.  Negative for fever, malaise/fatigue and weight loss.  Respiratory: Negative.  Negative for cough, hemoptysis and shortness of breath.   Cardiovascular: Negative.  Negative for chest pain and leg swelling.  Gastrointestinal: Negative.  Negative for abdominal pain.  Genitourinary: Negative.  Negative for dysuria.  Musculoskeletal: Negative.  Negative for back pain.  Skin: Negative.  Negative for rash.  Neurological: Negative.  Negative for sensory change, focal weakness, weakness and headaches.  Psychiatric/Behavioral: Negative.  The patient is not nervous/anxious.     As per HPI. Otherwise, a complete review of systems is negative.  PAST MEDICAL HISTORY: Past Medical History:  Diagnosis Date  . Arthritis   . Back pain   . CAD (coronary artery disease)   . Cataract    right  . COPD (chronic obstructive pulmonary disease) ([place])    SPiriva and SYmbicort daily. Albuterol as needed  . Diverticulosis    . Dyspnea    with exertion  . Enlarged prostate    takes Flomax daily  . GERD (gastroesophageal reflux disease)   . History of chicken pox   . History of gout   . History of measles   . History of mumps   . HOH (hard of hearing)   . Hyperglycemia   . Hyperlipidemia    takes Atorvastatin daily  . Hypertension    takes Metoprolol daily as well as Lotensin HCT  . Hypotension   . Joint pain   . Microscopic colitis   . PAF (paroxysmal atrial fibrillation) ([place]), RVR 03/14/2017  . Pneumonia   . Prostate cancer ([place])   . Stroke [facility])    TIA  . TIA (transient ischemic attack)   . Weakness    numbness and tingling.mainly on right    PAST SURGICAL HISTORY: Past Surgical History:  Procedure Laterality Date  . ANTERIOR CERVICAL DECOMP/DISCECTOMY FUSION N/A 03/07/2017   Procedure: ANTERIOR CERVICAL DECOMPRESSION/DISCECTOMY FUSION CERVICAL THREE- CERVICAL FOUR, CERVICAL FOUR- CERVICAL FIVE;  Surgeon: Newman Pies, MD;  Location: [place];  Service: Neurosurgery;  Laterality: N/A;  ANTERIOR CERVICAL DECOMPRESSION/DISCECTOMY FUSION CERVICAL 3- CERVICAL 4, CERVIACL 4- CERVICAL 5  . APPENDECTOMY  1950  . CARDIAC CATHETERIZATION  05/29/2013   EF=40-45%. Moderate pulmonary hypertension. Infero/ lateral hypokinesis  . cataract surgery Left   . COLONOSCOPY    . CORONARY ARTERY BYPASS GRAFT  2011   x 5  . ESOPHAGOGASTRODUODENOSCOPY (EGD) WITH PROPOFOL N/A 07/01/2018   Procedure: ESOPHAGOGASTRODUODENOSCOPY (EGD) WITH PROPOFOL;  Surgeon: Lin Landsman, MD;  Location: [place];  Service: Gastroenterology;  Laterality:  N/A;  . EYE SURGERY    . FLEXIBLE BRONCHOSCOPY N/A 11/19/2017   Procedure: FLEXIBLE BRONCHOSCOPY;  Surgeon: Laverle Hobby, MD;  Location: [facility];  Service: Pulmonary;  Laterality: N/A;  . MRI BRAIN  06/07/2013   Mild chronic involutional changes. No acute abnormalities  . MRI of neck    . Myocardial Perfusion Scan  05/29/2013   Abnormal myocardial perfusion  image consistent with myocardial infarction  . PROSTATE SURGERY  11/18/2012   radiation seed  . TOTAL HIP ARTHROPLASTY Left 1997   hip joint replacement    FAMILY HISTORY: Family History  Problem Relation Age of Onset  . Heart attack Mother   . Stroke Father   . Heart attack Father   . Hypertension Father   . Heart attack Sister   . Bladder Cancer Brother   . Kidney cancer Brother   . Heart Problems Brother     ADVANCED DIRECTIVES (Y/N):  N  HEALTH MAINTENANCE: Social History   Tobacco Use  . Smoking status: Former Smoker    Packs/day: 1.00    Years: 11.00    Pack years: 11.00    Types: Cigarettes    Last attempt to quit: 02/12/1984    Years since quitting: 34.9  . Smokeless tobacco: Never Used  . Tobacco comment: quit smoking 30 yrs ago  Substance Use Topics  . Alcohol use: Yes    Alcohol/week: 0.0 standard drinks    Comment: beer occasionally  . Drug use: No     Colonoscopy:  PAP:  Bone density:  Lipid panel:  Allergies  Allergen Reactions  . Indomethacin Nausea Only    Current Outpatient Medications  Medication Sig Dispense Refill  . albuterol (PROAIR HFA) 108 (90 Base) MCG/ACT inhaler TAKE 2 PUFFS INTO LUNGS EVERY 6 HOURS ASNEEDED FOR WHEEZING OR SHORTNESS OF BREATH 8.5 g 5  . atorvastatin (LIPITOR) 80 MG tablet TAKE 1 TABLET BY MOUTH DAILY GENERIC EQUIVALENT FOR LIPITOR 90 tablet 4  . azelastine (OPTIVAR) 0.05 % ophthalmic solution APPLY ONE DROP TO EYE TWICE DAILY 6 mL 5  . benazepril-hydrochlorthiazide (LOTENSIN HCT) 20-12.5 MG tablet TAKE ONE TABLET BY MOUTH EVERY DAY 90 tablet 4  . beta carotene w/minerals (OCUVITE) tablet Take 1 tablet by mouth daily.    . chlorpheniramine-HYDROcodone (TUSSIONEX) 10-8 MG/5ML SUER TAKE ONE TEASPOONFUL (5ML) EVERY TWELVE HOURS IF NEEDED FOR COUGH (Patient taking differently: Take 5 mLs by mouth every 12 (twelve) hours as needed for cough. ) 115 mL 0  . ELIQUIS 5 MG TABS tablet TAKE ONE TABLET BY MOUTH TWICE DAILY 60  tablet 11  . famotidine (PEPCID) 20 MG tablet Take 20 mg by mouth daily.   1  . fluticasone (FLONASE) 50 MCG/ACT nasal spray USE 2 SPRAYS IN EACH NOSTRIL DAILY (Patient taking differently: Place 2 sprays into both nostrils 2 (two) times daily. ) 48 g 3  . Fluticasone-Umeclidin-Vilant (TRELEGY ELLIPTA) 100-62.5-25 MCG/INH AEPB Inhale 1 applicator into the lungs daily. Rinse mouth after use. Stop symbicort and spiriva when taking this medicine. 1 each 10  . lansoprazole (PREVACID) 30 MG capsule TAKE 1 CAPSULE BY MOUTH EVERY DAY 30 capsule 12  . LORazepam (ATIVAN) 1 MG tablet 1/2 TO 1 TABLET BY MOUTH AT BEDTIME AS NEEDED FOR SLEEP (Patient taking differently: Take 1 mg by mouth at bedtime. ) 30 tablet 5  . Magnesium 400 MG CAPS Take 1 capsule by mouth daily.     . metoprolol tartrate (LOPRESSOR) 50 MG tablet TAKE ONE TABLET BY MOUTH  TWICE DAILY 180 tablet 4  . montelukast (SINGULAIR) 10 MG tablet TAKE ONE TABLET EVERY DAY (Patient taking differently: Take 10 mg by mouth at bedtime. ) 30 tablet 12  . MULTIPLE VITAMIN PO Take 1 tablet by mouth daily.     . Omega-3 Fatty Acids (FISH OIL) 1000 MG CAPS Take 1 capsule daily by mouth.    . tamsulosin (FLOMAX) 0.4 MG CAPS capsule Take 1 capsule (0.4 mg total) by mouth daily. 90 capsule 0  . traMADol (ULTRAM) 50 MG tablet Take 1 tablet (50 mg total) by mouth every 6 (six) hours as needed for moderate pain or severe pain. 20 tablet 0   No current facility-administered medications for this visit.     OBJECTIVE: Vitals:   01/16/19 1012  BP: 130/77  Pulse: 89  Temp: 97.8 F (36.6 C)  SpO2: 96%     Body mass index is 28.97 kg/m.    ECOG FS:0 - Asymptomatic  General: Well-developed, well-nourished, no acute distress. Eyes: Pink conjunctiva, anicteric sclera. HEENT: Normocephalic, moist mucous membranes. Lungs: Clear to auscultation bilaterally. Heart: Regular rate and rhythm. No rubs, murmurs, or gallops. Abdomen: Soft, nontender, nondistended. No  organomegaly noted, normoactive bowel sounds. Musculoskeletal: No edema, cyanosis, or clubbing. Neuro: Alert, answering all questions appropriately. Cranial nerves grossly intact. Skin: No rashes or petechiae noted. Psych: Normal affect.  LAB RESULTS:  Lab Results  Component Value Date   NA 131 (L) 12/12/2018   K 4.5 12/12/2018   CL 94 (L) 12/12/2018   CO2 25 12/12/2018   GLUCOSE 109 (H) 12/12/2018   BUN 12 12/12/2018   CREATININE 1.12 12/12/2018   CALCIUM 9.0 12/12/2018   PROT 6.8 11/06/2017   ALBUMIN 3.9 12/12/2018   AST 30 11/06/2017   ALT 28 11/06/2017   ALKPHOS 80 11/06/2017   BILITOT 0.9 11/06/2017   GFRNONAA 59 (L) 12/12/2018   GFRAA 68 12/12/2018    Lab Results  Component Value Date   WBC 4.4 12/12/2018   NEUTROABS 5.8 10/30/2017   HGB 12.5 (L) 12/12/2018   HCT 37.1 (L) 12/12/2018   MCV 87 12/12/2018   PLT 180 12/12/2018     STUDIES: Ct Chest W Contrast  Result Date: 12/31/2018 CLINICAL DATA:  History of lung cancer. Follow-up exam. EXAM: CT CHEST WITH CONTRAST TECHNIQUE: Multidetector CT imaging of the chest was performed during intravenous contrast administration. CONTRAST:  99m OMNIPAQUE IOHEXOL 300 MG/ML  SOLN COMPARISON:  Chest CT 07/02/2018 FINDINGS: Cardiovascular: Atherosclerosis of the thoracic aorta. Coronary arterial vascular calcifications. Normal heart size. No pericardial effusion. Mediastinum/Nodes: No enlarged axillary, mediastinal or hilar lymphadenopathy. Small hiatal hernia. Lungs/Pleura: Central airways are patent. Dependent atelectasis/scarring within the bilateral lower lobes. Interval development of a small right pleural effusion. There is subpleural consolidation within the right lower lobe measuring approximately 4.2 x 2.0 cm which likely represents combination of atelectasis and treated tumor (image 99; series 2). No pneumothorax. New small amount of peripheral ground-glass opacities within the left upper lobe (image 44; series 3). Upper  Abdomen: Unremarkable Musculoskeletal: Thoracic spine degenerative changes. No aggressive or acute appearing osseous lesions. Patient status post median sternotomy. Stable T4 and T10 vertebral compression deformities. IMPRESSION: Persistent subpleural masslike area of consolidation within the right lower lobe is difficult to measure however appears grossly similar when compared to prior exam New small right pleural effusion. New small amount of peripheral ground-glass opacity within the left upper lobe favored to represent infectious/inflammatory process. Recommend attention on follow-up. Aortic Atherosclerosis (ICD10-I70.0) and Emphysema (ICD10-J43.9).  Electronically Signed   By: Lovey Newcomer M.D.   On: 12/31/2018 13:56    ASSESSMENT: Clinical stage IIB adenocarcinoma of the right lower lobe lung.  PLAN:    1. Clinical stage IIB adenocarcinoma of the right lower lobe lung: Previously it was determined that patient was not a surgical candidate and given his advanced age chemotherapy is not recommended.  He completed XRT in April 2019.  CT scan results from December 31, 2018 reviewed independently and reported as above with persistent subpleural masslike area of consolidation, which is relatively unchanged.  No evidence of recurrent or progressive disease.  No intervention is needed at this time.  Return to clinic in 6 months with repeat imaging and further evaluation.  I spent a total of 20 minutes face-to-face with the patient of which greater than 50% of the visit was spent in counseling and coordination of care as detailed above.   Patient expressed understanding and was in agreement with this plan. He also understands that He can call clinic at any time with any questions, concerns, or complaints.   Cancer Staging Primary adenocarcinoma of lower lobe of right lung [facility]) Staging form: Lung, AJCC 8th Edition - Clinical stage from 02/16/2018: Stage IIB (cT3, cN0, cM0) - Signed by Lloyd Huger,  MD on 02/16/2018   Lloyd Huger, MD   01/17/2019 8:39 AM

## 2019-01-16 ENCOUNTER — Inpatient Hospital Stay: Payer: Medicare Other | Admitting: Oncology

## 2019-01-16 ENCOUNTER — Ambulatory Visit: Payer: Medicare Other | Admitting: Oncology

## 2019-01-16 ENCOUNTER — Other Ambulatory Visit: Payer: Self-pay

## 2019-01-16 ENCOUNTER — Ambulatory Visit
Admission: RE | Admit: 2019-01-16 | Discharge: 2019-01-16 | Disposition: A | Discharge status: Home / Self Care | Payer: Medicare Other | Source: Ambulatory Visit | Attending: Radiation Oncology | Admitting: Radiation Oncology

## 2019-01-16 ENCOUNTER — Encounter (INDEPENDENT_AMBULATORY_CARE_PROVIDER_SITE_OTHER): Payer: Self-pay

## 2019-01-16 VITALS — BP 130/77 | HR 89 | Temp 97.8°F | Ht 68.0 in | Wt 190.5 lb

## 2019-01-16 DIAGNOSIS — I10 Essential (primary) hypertension: Secondary | ICD-10-CM | POA: Insufficient documentation

## 2019-01-16 DIAGNOSIS — Z7901 Long term (current) use of anticoagulants: Secondary | ICD-10-CM | POA: Insufficient documentation

## 2019-01-16 DIAGNOSIS — Z923 Personal history of irradiation: Secondary | ICD-10-CM

## 2019-01-16 DIAGNOSIS — C3431 Malignant neoplasm of lower lobe, right bronchus or lung: Secondary | ICD-10-CM | POA: Insufficient documentation

## 2019-01-16 DIAGNOSIS — Z87891 Personal history of nicotine dependence: Secondary | ICD-10-CM

## 2019-01-16 DIAGNOSIS — I480 Paroxysmal atrial fibrillation: Secondary | ICD-10-CM

## 2019-01-16 DIAGNOSIS — Z79899 Other long term (current) drug therapy: Secondary | ICD-10-CM

## 2019-01-16 NOTE — Progress Notes (Signed)
Radiation Oncology Follow up Note  Name: James [name]   Date:   01/16/2019 MRN:  [id number] DOB: 1929/08/16    This 85 y.o. male presents to the clinic today for nine-month follow-up status post radiation therapy to his chest for stage IIB adenocarcinoma the right lower lobe.  REFERRING PROVIDER: Birdie Sons, MD  HPI: patient is a 85 year old male now out 9 months having completed radiation therapy to his right lower lobe for stage IIB (T3 N0 M0) adenocarcinoma S. He is seen today in follow-up and is doing well. He specifically denies hemoptysis or chest tightness does have some thick cough secretions for which I have recommended Mucinex..he recently had a CT scan which I have reviewed shows persistent masslike consolidation within the right lower lobe consistent with radiation change. On review of serial CT scans this mass is stable showing no progression of disease.  COMPLICATIONS OF TREATMENT: none  FOLLOW UP COMPLIANCE: keeps appointments   PHYSICAL EXAM:  There were no vitals taken for this visit. Well-developed well-nourished patient in NAD. HEENT reveals PERLA, EOMI, discs not visualized.  Oral cavity is clear. No oral mucosal lesions are identified. Neck is clear without evidence of cervical or supraclavicular adenopathy. Lungs are clear to A&P. Cardiac examination is essentially unremarkable with regular rate and rhythm without murmur rub or thrill. Abdomen is benign with no organomegaly or masses noted. Motor sensory and DTR levels are equal and symmetric in the upper and lower extremities. Cranial nerves II through XII are grossly intact. Proprioception is intact. No peripheral adenopathy or edema is identified. No motor or sensory levels are noted. Crude visual fields are within normal range.  RADIOLOGY RESULTS: CT scans are reviewed and compatible above-stated findings  PLAN: present time patient is doing well with stable disease in his chest no evidence of progression. I'm  please was overall progress. I've asked to see him back in 1 year for follow-up. He continues close follow-up care with medical oncology.patient is to call with any concerns at any time.  I would like to take this opportunity to thank you for allowing me to participate in the care of your patient.[name], MD

## 2019-01-16 NOTE — Progress Notes (Signed)
Patient is here today to follow up on his Primary adenocarcinoma of lower lobe of right lung. Patient stated that he gets SOB on exertion. Patient was given a new inhaler from Dr. Ashby Dawes and he stated that he doesn't think that it's helping him, so he will give him a call to let him know.

## 2019-01-17 ENCOUNTER — Ambulatory Visit: Payer: Medicare Other | Admitting: Radiation Oncology

## 2019-01-31 ENCOUNTER — Other Ambulatory Visit: Payer: Self-pay | Admitting: Family Medicine

## 2019-01-31 DIAGNOSIS — N400 Enlarged prostate without lower urinary tract symptoms: Secondary | ICD-10-CM

## 2019-01-31 MED ORDER — TAMSULOSIN HCL 0.4 MG PO CAPS: 0.4000 mg | ORAL_CAPSULE | Freq: Every day | ORAL | 3 refills | Status: DC

## 2019-01-31 NOTE — Telephone Encounter (Signed)
[facility] faxed refill request for the following medications:  tamsulosin (FLOMAX) 0.4 MG CAPS capsule  Please advise.

## 2019-03-12 ENCOUNTER — Other Ambulatory Visit: Payer: Self-pay | Admitting: Family Medicine

## 2019-03-12 DIAGNOSIS — R0602 Shortness of breath: Secondary | ICD-10-CM

## 2019-03-12 NOTE — Telephone Encounter (Signed)
Please review since one of the medications requested is for Hycodan cough syrup.

## 2019-03-25 DIAGNOSIS — I480 Paroxysmal atrial fibrillation: Secondary | ICD-10-CM | POA: Diagnosis not present

## 2019-04-09 ENCOUNTER — Other Ambulatory Visit: Payer: Self-pay | Admitting: Family Medicine

## 2019-04-22 ENCOUNTER — Other Ambulatory Visit: Payer: Self-pay | Admitting: Family Medicine

## 2019-04-28 ENCOUNTER — Other Ambulatory Visit: Payer: Self-pay | Admitting: Family Medicine

## 2019-06-11 ENCOUNTER — Other Ambulatory Visit: Payer: Self-pay | Admitting: Family Medicine

## 2019-06-13 IMAGING — CT CT CHEST W/ CM
2 of 3 series · 14 of 36 positions shown, 17 images · IV contrast (omnipaque)
Comparison: Chest CT 07/02/2018

CLINICAL DATA: History of lung cancer. Follow-up exam.

EXAM:
CT CHEST WITH CONTRAST
TECHNIQUE: Multidetector CT imaging of the chest was performed during
intravenous contrast administration.
CONTRAST:  75mL OMNIPAQUE IOHEXOL 300 MG/ML  SOLN

[Series 2: axial st · axial · 0.66mm/px · z∈[-244,-10]mm · 11 of 139 slices shown, 14 images]
[im 11/139  mediastinal]
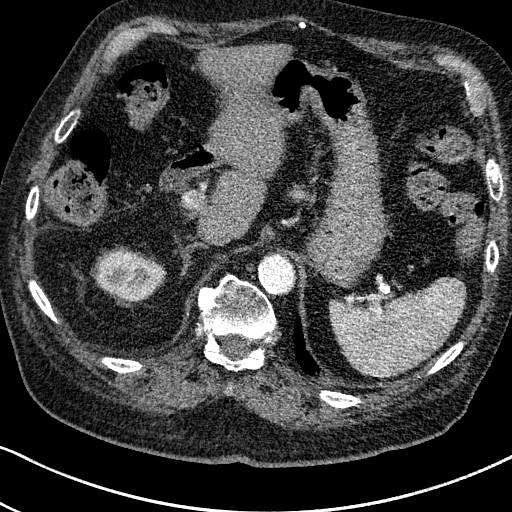
[im 11/139  lung]
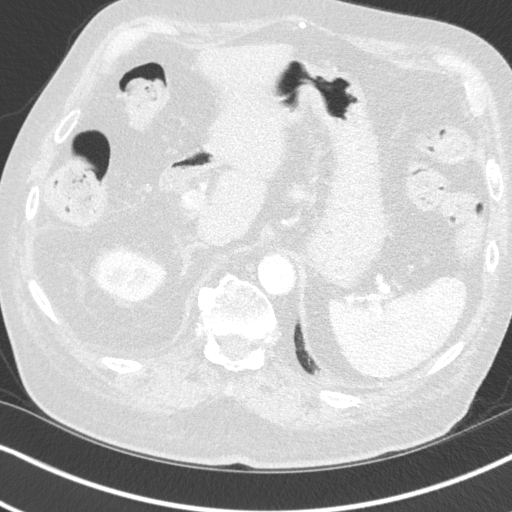
[im 21/139  lung]
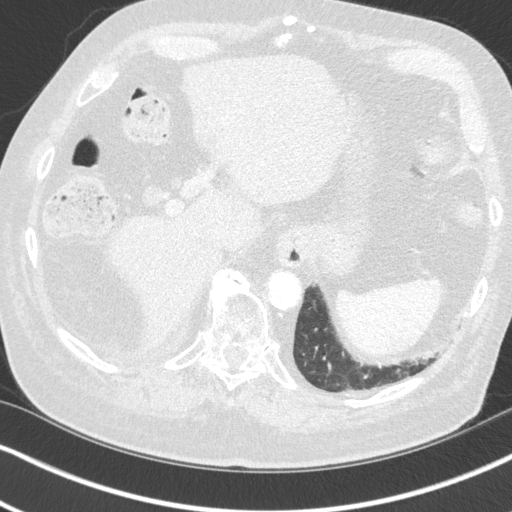
[im 31/139  lung]
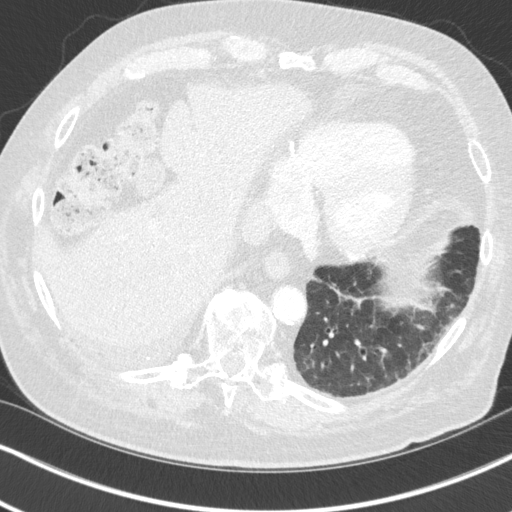
[im 47/139  lung]
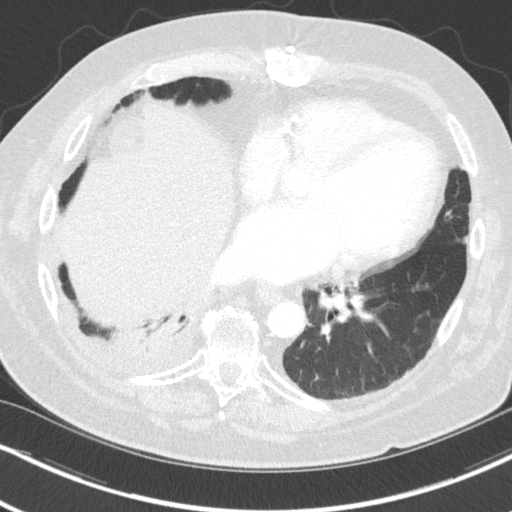
[im 57/139  mediastinal]
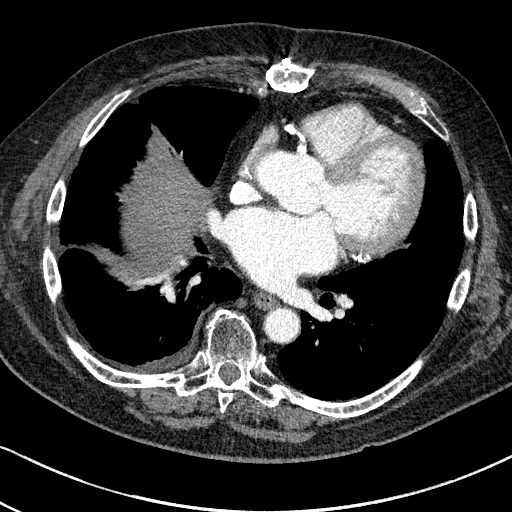
[im 57/139  lung]
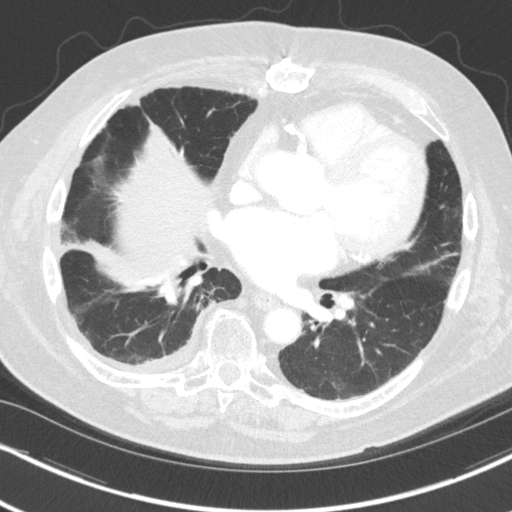
[im 72/139  lung]
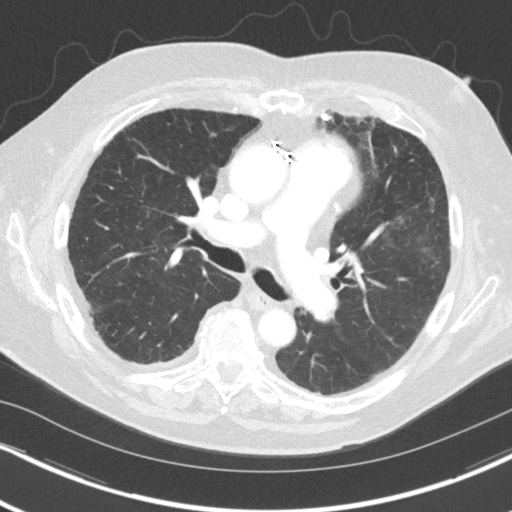
[im 82/139  lung]
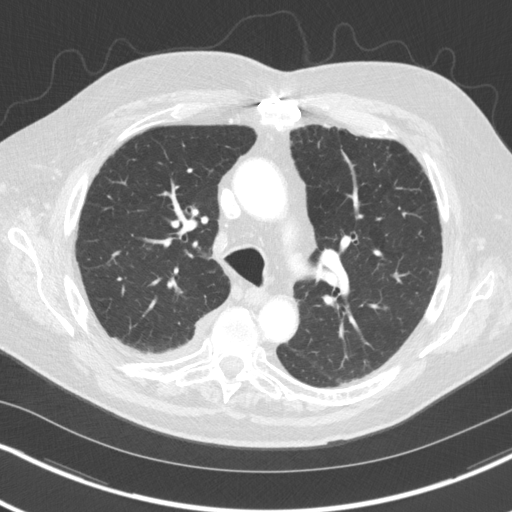
[im 93/139  lung]
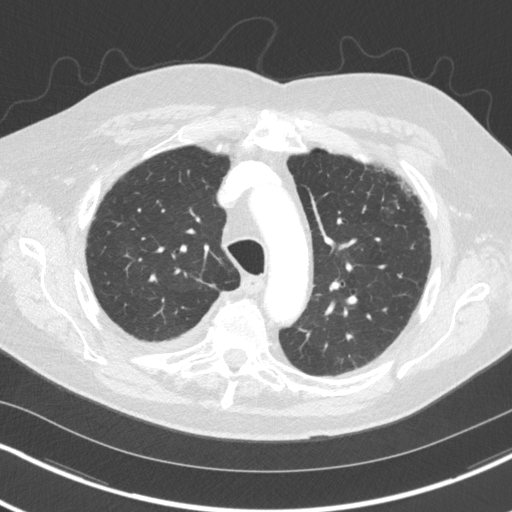
[im 108/139  mediastinal]
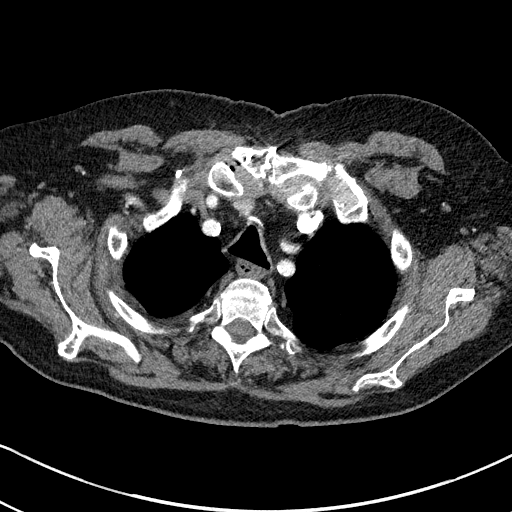
[im 108/139  lung]
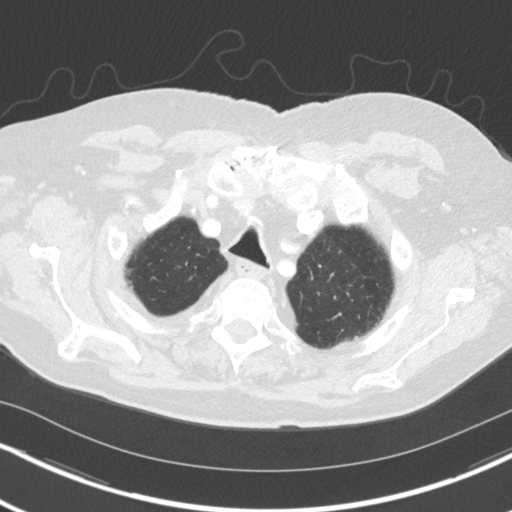
[im 118/139  lung]
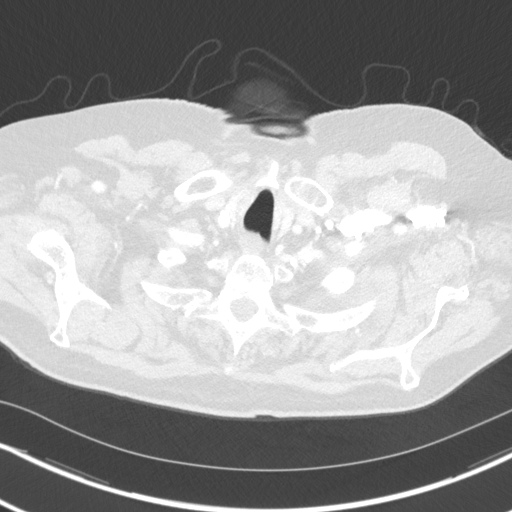
[im 128/139  lung]
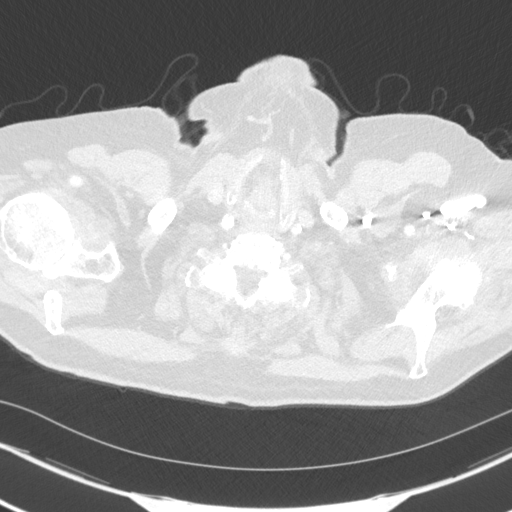

[Series 5: coronal · coronal · 0.56mm/px · 3 of 161 slices shown]
[im 33/161  lung]
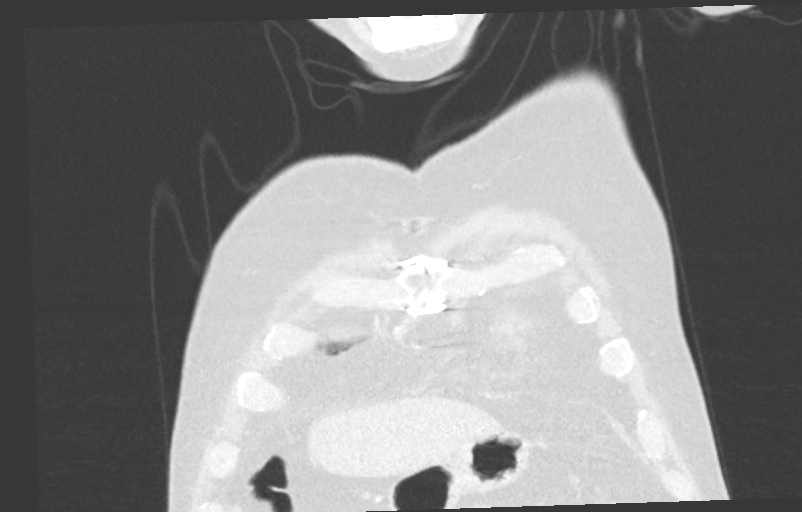
[im 65/161  lung]
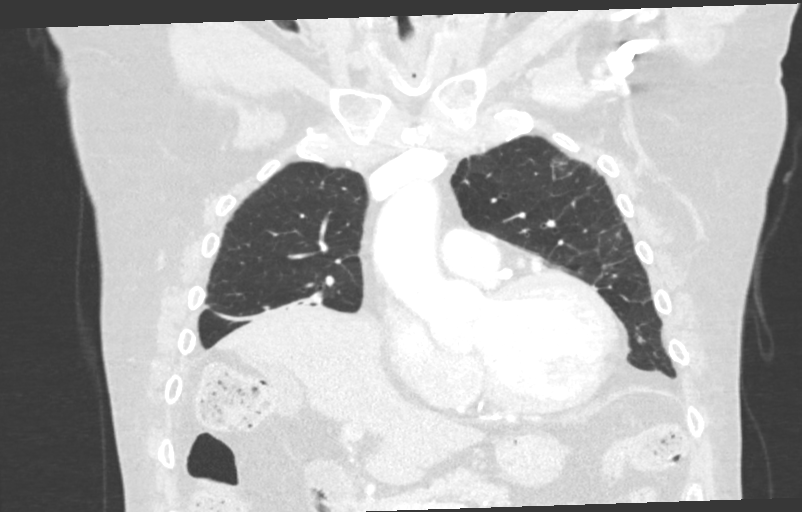
[im 97/161  lung]
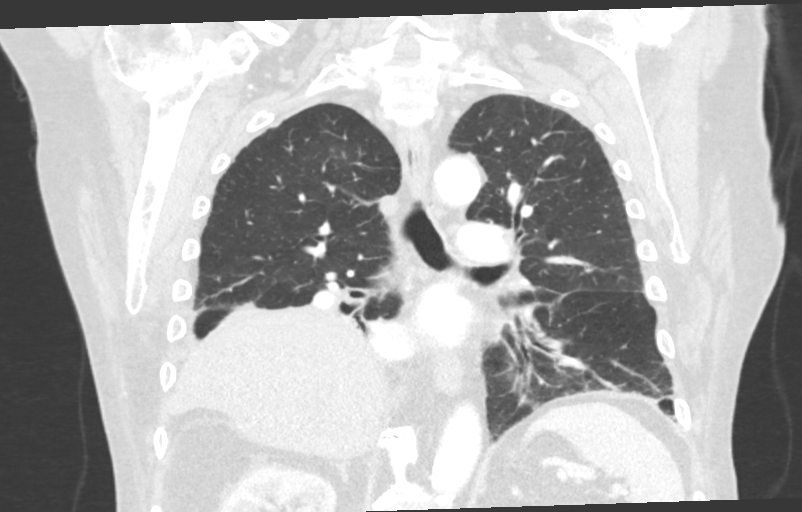

[14 of 36 positions shown; findings below may reference images not displayed]

FINDINGS: Cardiovascular: Atherosclerosis of the thoracic aorta. Coronary
arterial vascular calcifications. Normal heart size. No pericardial
effusion.

Mediastinum/Nodes: No enlarged axillary, mediastinal or hilar
lymphadenopathy. Small hiatal hernia.

Lungs/Pleura: Central airways are patent. Dependent
atelectasis/scarring within the bilateral lower lobes. Interval
development of a small right pleural effusion. There is subpleural
consolidation within the right lower lobe measuring approximately
4.2 x 2.0 cm which likely represents combination of atelectasis and
treated tumor (image 99; series 2). No pneumothorax. New small
amount of peripheral ground-glass opacities within the left upper
lobe (image 44; series 3).

Upper Abdomen: Unremarkable

Musculoskeletal: Thoracic spine degenerative changes. No aggressive
or acute appearing osseous lesions. Patient status post median
sternotomy. Stable T4 and T10 vertebral compression deformities.
IMPRESSION: Persistent subpleural masslike area of consolidation within the
right lower lobe is difficult to measure however appears grossly
similar when compared to prior exam

New small right pleural effusion.

New small amount of peripheral ground-glass opacity within the left
upper lobe favored to represent infectious/inflammatory process.
Recommend attention on follow-up.

Aortic Atherosclerosis (QII3E-QFV.V) and Emphysema (QII3E-KAI.G).

## 2019-07-13 ENCOUNTER — Other Ambulatory Visit: Payer: Self-pay

## 2019-07-13 ENCOUNTER — Encounter: Payer: Self-pay | Admitting: Emergency Medicine

## 2019-07-13 ENCOUNTER — Emergency Department: Payer: Medicare Other

## 2019-07-13 ENCOUNTER — Observation Stay
Admission: EM | Admit: 2019-07-13 | Discharge: 2019-07-14 | Disposition: A | Discharge status: Home / Self Care | Payer: Medicare Other | Attending: Specialist | Admitting: Specialist

## 2019-07-13 DIAGNOSIS — I2510 Atherosclerotic heart disease of native coronary artery without angina pectoris: Secondary | ICD-10-CM | POA: Insufficient documentation

## 2019-07-13 DIAGNOSIS — E785 Hyperlipidemia, unspecified: Secondary | ICD-10-CM | POA: Insufficient documentation

## 2019-07-13 DIAGNOSIS — Z802 Family history of malignant neoplasm of other respiratory and intrathoracic organs: Secondary | ICD-10-CM | POA: Insufficient documentation

## 2019-07-13 DIAGNOSIS — Z8673 Personal history of transient ischemic attack (TIA), and cerebral infarction without residual deficits: Secondary | ICD-10-CM | POA: Diagnosis not present

## 2019-07-13 DIAGNOSIS — I5022 Chronic systolic (congestive) heart failure: Secondary | ICD-10-CM | POA: Insufficient documentation

## 2019-07-13 DIAGNOSIS — Z888 Allergy status to other drugs, medicaments and biological substances status: Secondary | ICD-10-CM | POA: Insufficient documentation

## 2019-07-13 DIAGNOSIS — R079 Chest pain, unspecified: Secondary | ICD-10-CM | POA: Diagnosis not present

## 2019-07-13 DIAGNOSIS — Z20828 Contact with and (suspected) exposure to other viral communicable diseases: Secondary | ICD-10-CM | POA: Diagnosis not present

## 2019-07-13 DIAGNOSIS — N400 Enlarged prostate without lower urinary tract symptoms: Secondary | ICD-10-CM | POA: Diagnosis not present

## 2019-07-13 DIAGNOSIS — Z7901 Long term (current) use of anticoagulants: Secondary | ICD-10-CM | POA: Insufficient documentation

## 2019-07-13 DIAGNOSIS — Z1159 Encounter for screening for other viral diseases: Secondary | ICD-10-CM | POA: Diagnosis not present

## 2019-07-13 DIAGNOSIS — Z8546 Personal history of malignant neoplasm of prostate: Secondary | ICD-10-CM | POA: Diagnosis not present

## 2019-07-13 DIAGNOSIS — I480 Paroxysmal atrial fibrillation: Secondary | ICD-10-CM | POA: Diagnosis not present

## 2019-07-13 DIAGNOSIS — Z79899 Other long term (current) drug therapy: Secondary | ICD-10-CM | POA: Diagnosis not present

## 2019-07-13 DIAGNOSIS — R0902 Hypoxemia: Secondary | ICD-10-CM | POA: Diagnosis not present

## 2019-07-13 DIAGNOSIS — I110 Hypertensive heart disease with heart failure: Secondary | ICD-10-CM | POA: Insufficient documentation

## 2019-07-13 DIAGNOSIS — Z87891 Personal history of nicotine dependence: Secondary | ICD-10-CM | POA: Insufficient documentation

## 2019-07-13 DIAGNOSIS — Z8249 Family history of ischemic heart disease and other diseases of the circulatory system: Secondary | ICD-10-CM | POA: Diagnosis not present

## 2019-07-13 DIAGNOSIS — Z8052 Family history of malignant neoplasm of bladder: Secondary | ICD-10-CM | POA: Insufficient documentation

## 2019-07-13 DIAGNOSIS — R0602 Shortness of breath: Secondary | ICD-10-CM | POA: Diagnosis not present

## 2019-07-13 DIAGNOSIS — Z823 Family history of stroke: Secondary | ICD-10-CM | POA: Insufficient documentation

## 2019-07-13 DIAGNOSIS — R069 Unspecified abnormalities of breathing: Secondary | ICD-10-CM | POA: Diagnosis not present

## 2019-07-13 DIAGNOSIS — J441 Chronic obstructive pulmonary disease with (acute) exacerbation: Secondary | ICD-10-CM | POA: Diagnosis not present

## 2019-07-13 DIAGNOSIS — Z96642 Presence of left artificial hip joint: Secondary | ICD-10-CM | POA: Insufficient documentation

## 2019-07-13 DIAGNOSIS — J9601 Acute respiratory failure with hypoxia: Secondary | ICD-10-CM | POA: Diagnosis not present

## 2019-07-13 DIAGNOSIS — Z981 Arthrodesis status: Secondary | ICD-10-CM | POA: Insufficient documentation

## 2019-07-13 DIAGNOSIS — K219 Gastro-esophageal reflux disease without esophagitis: Secondary | ICD-10-CM | POA: Insufficient documentation

## 2019-07-13 DIAGNOSIS — J9621 Acute and chronic respiratory failure with hypoxia: Secondary | ICD-10-CM | POA: Diagnosis not present

## 2019-07-13 DIAGNOSIS — Z951 Presence of aortocoronary bypass graft: Secondary | ICD-10-CM | POA: Insufficient documentation

## 2019-07-13 DIAGNOSIS — I499 Cardiac arrhythmia, unspecified: Secondary | ICD-10-CM | POA: Diagnosis not present

## 2019-07-13 DIAGNOSIS — I10 Essential (primary) hypertension: Secondary | ICD-10-CM | POA: Diagnosis not present

## 2019-07-13 LAB — COMPREHENSIVE METABOLIC PANEL
ALT: 25 U/L (ref 0–44)
AST: 28 U/L (ref 15–41)
Albumin: 4 g/dL (ref 3.5–5.0)
Alkaline Phosphatase: 105 U/L (ref 38–126)
Anion gap: 7 (ref 5–15)
BUN: 14 mg/dL (ref 8–23)
CO2: 27 mmol/L (ref 22–32)
Calcium: 8.1 mg/dL — ABNORMAL LOW (ref 8.9–10.3)
Chloride: 99 mmol/L (ref 98–111)
Creatinine, Ser: 0.92 mg/dL (ref 0.61–1.24)
GFR calc Af Amer: >60 mL/min (ref >60)
GFR calc non Af Amer: >60 mL/min (ref >60)
Glucose, Bld: 119 mg/dL — ABNORMAL HIGH (ref 70–99)
Potassium: 4.4 mmol/L (ref 3.5–5.1)
Sodium: 133 mmol/L — ABNORMAL LOW (ref 135–145)
Total Bilirubin: 0.8 mg/dL (ref 0.3–1.2)
Total Protein: 6.6 g/dL (ref 6.5–8.1)

## 2019-07-13 LAB — BLOOD GAS, VENOUS
Acid-Base Excess: 3.6 mmol/L — ABNORMAL HIGH (ref 0.0–2.0)
Bicarbonate: 30.1 mmol/L — ABNORMAL HIGH (ref 20.0–28.0)
Delivery systems: POSITIVE
FIO2: 0.4
Mechanical Rate: 8
O2 Saturation: 75.8 %
Patient temperature: 37
pCO2, Ven: 52 mmHg (ref 44.0–60.0)
pH, Ven: 7.37 (ref 7.250–7.430)
pO2, Ven: 42 mmHg (ref 32.0–45.0)

## 2019-07-13 LAB — CBC WITH DIFFERENTIAL/PLATELET
Abs Immature Granulocytes: 0.02 10*3/uL (ref 0.00–0.07)
Basophils Absolute: 0.1 10*3/uL (ref 0.0–0.1)
Basophils Relative: 1 %
Eosinophils Absolute: 0.5 10*3/uL (ref 0.0–0.5)
Eosinophils Relative: 9 %
HCT: 37.4 % — ABNORMAL LOW (ref 39.0–52.0)
Hemoglobin: 12.4 g/dL — ABNORMAL LOW (ref 13.0–17.0)
Immature Granulocytes: 0 %
Lymphocytes Relative: 21 %
Lymphs Abs: 1 10*3/uL (ref 0.7–4.0)
MCH: 29.5 pg (ref 26.0–34.0)
MCHC: 33.2 g/dL (ref 30.0–36.0)
MCV: 88.8 fL (ref 80.0–100.0)
Monocytes Absolute: 0.5 10*3/uL (ref 0.1–1.0)
Monocytes Relative: 9 %
Neutro Abs: 3 10*3/uL (ref 1.7–7.7)
Neutrophils Relative %: 60 %
Platelets: 158 10*3/uL (ref 150–400)
RBC: 4.21 MIL/uL — ABNORMAL LOW (ref 4.22–5.81)
RDW: 14.6 % (ref 11.5–15.5)
WBC: 5 10*3/uL (ref 4.0–10.5)
nRBC: 0 % (ref 0.0–0.2)

## 2019-07-13 LAB — SARS CORONAVIRUS 2 BY RT PCR (HOSPITAL ORDER, PERFORMED IN ~~LOC~~ HOSPITAL LAB): SARS Coronavirus 2: NEGATIVE

## 2019-07-13 LAB — HEMOGLOBIN A1C
Hgb A1c MFr Bld: 6.1 % — ABNORMAL HIGH (ref 4.8–5.6)
Mean Plasma Glucose: 128.37 mg/dL

## 2019-07-13 LAB — TROPONIN I (HIGH SENSITIVITY)
Troponin I (High Sensitivity): 10 ng/L (ref <18)
Troponin I (High Sensitivity): 6 ng/L (ref <18)

## 2019-07-13 LAB — BRAIN NATRIURETIC PEPTIDE: B Natriuretic Peptide: 109 pg/mL — ABNORMAL HIGH (ref 0.0–100.0)

## 2019-07-13 LAB — TSH: TSH: 0.673 u[IU]/mL (ref 0.350–4.500)

## 2019-07-13 MED ORDER — BENAZEPRIL-HYDROCHLOROTHIAZIDE 20-12.5 MG PO TABS: 1.0000 | ORAL_TABLET | Freq: Every day | ORAL | Status: DC

## 2019-07-13 MED ORDER — AZITHROMYCIN 500 MG PO TABS
500.0000 mg | ORAL_TABLET | Freq: Every day | ORAL | Status: AC
  Administered 2019-07-13: 09:00:00 500 mg via ORAL
  Filled 2019-07-13: qty 1

## 2019-07-13 MED ORDER — PREDNISONE 10 MG PO TABS: ORAL_TABLET | ORAL | 0 refills | Status: DC

## 2019-07-13 MED ORDER — ATORVASTATIN CALCIUM 20 MG PO TABS
80.0000 mg | ORAL_TABLET | Freq: Every day | ORAL | Status: DC
  Administered 2019-07-13: 17:00:00 80 mg via ORAL
  Filled 2019-07-13: qty 4

## 2019-07-13 MED ORDER — AZITHROMYCIN 250 MG PO TABS: 250.0000 mg | ORAL_TABLET | Freq: Every day | ORAL | 0 refills | Status: AC

## 2019-07-13 MED ORDER — IPRATROPIUM-ALBUTEROL 0.5-2.5 (3) MG/3ML IN SOLN
3.0000 mL | Freq: Once | RESPIRATORY_TRACT | Status: AC
  Administered 2019-07-13: 3 mL via RESPIRATORY_TRACT
  Filled 2019-07-13: qty 3

## 2019-07-13 MED ORDER — APIXABAN 5 MG PO TABS
5.0000 mg | ORAL_TABLET | Freq: Two times a day (BID) | ORAL | Status: DC
  Administered 2019-07-13 – 2019-07-14 (×3): 5 mg via ORAL
  Filled 2019-07-13 (×3): qty 1

## 2019-07-13 MED ORDER — ALBUTEROL SULFATE (2.5 MG/3ML) 0.083% IN NEBU
2.5000 mg | INHALATION_SOLUTION | Freq: Four times a day (QID) | RESPIRATORY_TRACT | Status: DC
  Administered 2019-07-13 (×2): 2.5 mg via RESPIRATORY_TRACT
  Filled 2019-07-13 (×2): qty 3

## 2019-07-13 MED ORDER — ONDANSETRON HCL 4 MG PO TABS: 4.0000 mg | ORAL_TABLET | Freq: Four times a day (QID) | ORAL | Status: DC | PRN

## 2019-07-13 MED ORDER — ALBUTEROL SULFATE (2.5 MG/3ML) 0.083% IN NEBU
2.5000 mg | INHALATION_SOLUTION | RESPIRATORY_TRACT | Status: DC
  Administered 2019-07-13: 2.5 mg via RESPIRATORY_TRACT
  Filled 2019-07-13: qty 3

## 2019-07-13 MED ORDER — FLUTICASONE FUROATE-VILANTEROL 100-25 MCG/INH IN AEPB
1.0000 | INHALATION_SPRAY | Freq: Every day | RESPIRATORY_TRACT | Status: DC
  Administered 2019-07-13 – 2019-07-14 (×2): 1 via RESPIRATORY_TRACT
  Filled 2019-07-13: qty 28

## 2019-07-13 MED ORDER — PREDNISONE 20 MG PO TABS
50.0000 mg | ORAL_TABLET | Freq: Every day | ORAL | Status: DC
  Administered 2019-07-13 – 2019-07-14 (×2): 50 mg via ORAL
  Filled 2019-07-13 (×2): qty 1

## 2019-07-13 MED ORDER — FLUTICASONE-UMECLIDIN-VILANT 100-62.5-25 MCG/INH IN AEPB: 1.0000 | INHALATION_SPRAY | Freq: Every day | RESPIRATORY_TRACT | Status: DC

## 2019-07-13 MED ORDER — DOCUSATE SODIUM 100 MG PO CAPS
100.0000 mg | ORAL_CAPSULE | Freq: Two times a day (BID) | ORAL | Status: DC
  Administered 2019-07-13 – 2019-07-14 (×3): 100 mg via ORAL
  Filled 2019-07-13 (×3): qty 1

## 2019-07-13 MED ORDER — AZITHROMYCIN 500 MG PO TABS
250.0000 mg | ORAL_TABLET | Freq: Every day | ORAL | Status: DC
  Administered 2019-07-14: 250 mg via ORAL
  Filled 2019-07-13: qty 1

## 2019-07-13 MED ORDER — TAMSULOSIN HCL 0.4 MG PO CAPS
0.4000 mg | ORAL_CAPSULE | Freq: Every day | ORAL | Status: DC
  Administered 2019-07-13 – 2019-07-14 (×2): 0.4 mg via ORAL
  Filled 2019-07-13 (×2): qty 1

## 2019-07-13 MED ORDER — BENAZEPRIL HCL 20 MG PO TABS
20.0000 mg | ORAL_TABLET | Freq: Every day | ORAL | Status: DC
  Administered 2019-07-13 – 2019-07-14 (×2): 20 mg via ORAL
  Filled 2019-07-13 (×2): qty 1

## 2019-07-13 MED ORDER — METHYLPREDNISOLONE SODIUM SUCC 125 MG IJ SOLR
125.0000 mg | Freq: Once | INTRAMUSCULAR | Status: AC
  Administered 2019-07-13: 125 mg via INTRAVENOUS
  Filled 2019-07-13: qty 2

## 2019-07-13 MED ORDER — ONDANSETRON HCL 4 MG/2ML IJ SOLN: 4.0000 mg | Freq: Four times a day (QID) | INTRAMUSCULAR | Status: DC | PRN

## 2019-07-13 MED ORDER — ACETAMINOPHEN 650 MG RE SUPP: 650.0000 mg | Freq: Four times a day (QID) | RECTAL | Status: DC | PRN

## 2019-07-13 MED ORDER — METOPROLOL TARTRATE 50 MG PO TABS
50.0000 mg | ORAL_TABLET | Freq: Two times a day (BID) | ORAL | Status: DC
  Administered 2019-07-13 – 2019-07-14 (×3): 50 mg via ORAL
  Filled 2019-07-13 (×3): qty 1

## 2019-07-13 MED ORDER — HYDROCHLOROTHIAZIDE 12.5 MG PO CAPS
12.5000 mg | ORAL_CAPSULE | Freq: Every day | ORAL | Status: DC
  Administered 2019-07-13 – 2019-07-14 (×2): 12.5 mg via ORAL
  Filled 2019-07-13 (×2): qty 1

## 2019-07-13 MED ORDER — UMECLIDINIUM BROMIDE 62.5 MCG/INH IN AEPB
1.0000 | INHALATION_SPRAY | Freq: Every day | RESPIRATORY_TRACT | Status: DC
  Administered 2019-07-13 – 2019-07-14 (×2): 1 via RESPIRATORY_TRACT
  Filled 2019-07-13: qty 7

## 2019-07-13 MED ORDER — PANTOPRAZOLE SODIUM 40 MG PO TBEC
40.0000 mg | DELAYED_RELEASE_TABLET | Freq: Every day | ORAL | Status: DC
  Administered 2019-07-13 – 2019-07-14 (×2): 40 mg via ORAL
  Filled 2019-07-13 (×2): qty 1

## 2019-07-13 MED ORDER — FUROSEMIDE 10 MG/ML IJ SOLN: 40.0000 mg | Freq: Once | INTRAMUSCULAR | Status: DC

## 2019-07-13 MED ORDER — ACETAMINOPHEN 325 MG PO TABS: 650.0000 mg | ORAL_TABLET | Freq: Four times a day (QID) | ORAL | Status: DC | PRN

## 2019-07-13 MED ORDER — LORAZEPAM 1 MG PO TABS: 1.0000 mg | ORAL_TABLET | Freq: Every day | ORAL | Status: DC

## 2019-07-13 NOTE — H&P (Signed)
James [name] is an 85 y.o. male.   Chief Complaint: Shortness of breath HPI: The patient with past medical history of COPD, CAD, hypertension and history of stroke presents to the emergency department complaining of shortness of breath.  The patient reports rapidly worsening dyspnea over the last day or so.  He has had a nonproductive cough. No travel risk factors or known contact with individuals with exposure to novel coronavirus.  The patient was started on BiPAP which promptly improved his work of breathing.  He received Solu-Medrol as well as breathing treatments prior to the emergency department staff calling the hospitalist service for admission.   Past Medical History:  Diagnosis Date  . Arthritis   . Back pain   . CAD (coronary artery disease)   . Cataract    right  . COPD (chronic obstructive pulmonary disease) ([place])    SPiriva and SYmbicort daily. Albuterol as needed  . Diverticulosis   . Dyspnea    with exertion  . Enlarged prostate    takes Flomax daily  . GERD (gastroesophageal reflux disease)   . History of chicken pox   . History of gout   . History of measles   . History of mumps   . HOH (hard of hearing)   . Hyperglycemia   . Hyperlipidemia    takes Atorvastatin daily  . Hypertension    takes Metoprolol daily as well as Lotensin HCT  . Hypotension   . Joint pain   . Microscopic colitis   . PAF (paroxysmal atrial fibrillation) ([place]), RVR 03/14/2017  . Pneumonia   . Prostate cancer ([place])   . Stroke [facility])    TIA  . TIA (transient ischemic attack)   . Weakness    numbness and tingling.mainly on right    Past Surgical History:  Procedure Laterality Date  . ANTERIOR CERVICAL DECOMP/DISCECTOMY FUSION N/A 03/07/2017   Procedure: ANTERIOR CERVICAL DECOMPRESSION/DISCECTOMY FUSION CERVICAL THREE- CERVICAL FOUR, CERVICAL FOUR- CERVICAL FIVE;  Surgeon: Newman Pies, MD;  Location: [place];  Service: Neurosurgery;  Laterality: N/A;  ANTERIOR CERVICAL  DECOMPRESSION/DISCECTOMY FUSION CERVICAL 3- CERVICAL 4, CERVIACL 4- CERVICAL 5  . APPENDECTOMY  1950  . CARDIAC CATHETERIZATION  05/29/2013   EF=40-45%. Moderate pulmonary hypertension. Infero/ lateral hypokinesis  . cataract surgery Left   . COLONOSCOPY    . CORONARY ARTERY BYPASS GRAFT  2011   x 5  . ESOPHAGOGASTRODUODENOSCOPY (EGD) WITH PROPOFOL N/A 07/01/2018   Procedure: ESOPHAGOGASTRODUODENOSCOPY (EGD) WITH PROPOFOL;  Surgeon: Lin Landsman, MD;  Location: [place];  Service: Gastroenterology;  Laterality: N/A;  . EYE SURGERY    . FLEXIBLE BRONCHOSCOPY N/A 11/19/2017   Procedure: FLEXIBLE BRONCHOSCOPY;  Surgeon: Laverle Hobby, MD;  Location: [facility];  Service: Pulmonary;  Laterality: N/A;  . MRI BRAIN  06/07/2013   Mild chronic involutional changes. No acute abnormalities  . MRI of neck    . Myocardial Perfusion Scan  05/29/2013   Abnormal myocardial perfusion image consistent with myocardial infarction  . PROSTATE SURGERY  11/18/2012   radiation seed  . TOTAL HIP ARTHROPLASTY Left 1997   hip joint replacement    Family History  Problem Relation Age of Onset  . Heart attack Mother   . Stroke Father   . Heart attack Father   . Hypertension Father   . Heart attack Sister   . Bladder Cancer Brother   . Kidney cancer Brother   . Heart Problems Brother    Social History:  reports that he quit  smoking about 35 years ago. His smoking use included cigarettes. He has a 11.00 pack-year smoking history. He has never used smokeless tobacco. He reports current alcohol use. He reports that he does not use drugs.  Allergies:  Allergies  Allergen Reactions  . Indomethacin Nausea Only    Medications Prior to Admission  Medication Sig Dispense Refill  . albuterol (PROAIR HFA) 108 (90 Base) MCG/ACT inhaler TAKE 2 PUFFS INTO LUNGS EVERY 6 HOURS ASNEEDED FOR WHEEZING OR SHORTNESS OF BREATH 8.5 g 5  . atorvastatin (LIPITOR) 80 MG tablet TAKE 1 TABLET BY MOUTH DAILY  GENERIC EQUIVALENT FOR LIPITOR 90 tablet 4  . azelastine (OPTIVAR) 0.05 % ophthalmic solution APPLY ONE DROP TO EYE TWICE DAILY 6 mL 5  . benazepril-hydrochlorthiazide (LOTENSIN HCT) 20-12.5 MG tablet TAKE ONE TABLET BY MOUTH EVERY DAY 90 tablet 4  . beta carotene w/minerals (OCUVITE) tablet Take 1 tablet by mouth daily.    . chlorpheniramine-HYDROcodone (TUSSIONEX) 10-8 MG/5ML SUER TAKE 1 TEASPOONFUL (5ML) BY MOUTH EVERY 12 HOURS IF NEEDED FOR COUGH 115 mL 0  . ELIQUIS 5 MG TABS tablet TAKE 1 TABLET BY MOUTH TWICE DAILY 180 tablet 4  . famotidine (PEPCID) 20 MG tablet TAKE 1-2 TABLETS BY MOUTH DAILY FOR COUGH 60 tablet 12  . fluticasone (FLONASE) 50 MCG/ACT nasal spray Place 2 sprays into both nostrils 2 (two) times daily. 48 g 3  . Fluticasone-Umeclidin-Vilant (TRELEGY ELLIPTA) 100-62.5-25 MCG/INH AEPB Inhale 1 applicator into the lungs daily. Rinse mouth after use. Stop symbicort and spiriva when taking this medicine. 1 each 10  . lansoprazole (PREVACID) 30 MG capsule TAKE 1 CAPSULE EVERY DAY 90 capsule 3  . LORazepam (ATIVAN) 1 MG tablet Take 1 tablet (1 mg total) by mouth at bedtime. 30 tablet 3  . Magnesium 400 MG CAPS Take 1 capsule by mouth daily.     . metoprolol tartrate (LOPRESSOR) 50 MG tablet TAKE ONE TABLET BY MOUTH TWICE DAILY 180 tablet 4  . montelukast (SINGULAIR) 10 MG tablet Take 1 tablet (10 mg total) by mouth at bedtime. 90 tablet 3  . MULTIPLE VITAMIN PO Take 1 tablet by mouth daily.     . Omega-3 Fatty Acids (FISH OIL) 1000 MG CAPS Take 1 capsule daily by mouth.    . SPIRIVA HANDIHALER 18 MCG inhalation capsule INHALE CONTENTS OF 1 CAPSULE AS DIRECTEDONCE A DAY VIA HANDIHALER 30 capsule 12  . tamsulosin (FLOMAX) 0.4 MG CAPS capsule Take 1 capsule (0.4 mg total) by mouth daily. 90 capsule 3  . traMADol (ULTRAM) 50 MG tablet Take 1 tablet (50 mg total) by mouth every 6 (six) hours as needed for moderate pain or severe pain. 20 tablet 0    Results for orders placed or  performed during the hospital encounter of 07/13/19 (from the past 48 hour(s))  CBC with Differential     Status: Abnormal   Collection Time: 07/13/19  2:02 AM  Result Value Ref Range   WBC 5.0 4.0 - 10.5 K/uL   RBC 4.21 (L) 4.22 - 5.81 MIL/uL   Hemoglobin 12.4 (L) 13.0 - 17.0 g/dL   HCT 37.4 (L) 39.0 - 52.0 %   MCV 88.8 80.0 - 100.0 fL   MCH 29.5 26.0 - 34.0 pg   MCHC 33.2 30.0 - 36.0 g/dL   RDW 14.6 11.5 - 15.5 %   Platelets 158 150 - 400 K/uL   nRBC 0.0 0.0 - 0.2 %   Neutrophils Relative % 60 %   Neutro Abs 3.0  1.7 - 7.7 K/uL   Lymphocytes Relative 21 %   Lymphs Abs 1.0 0.7 - 4.0 K/uL   Monocytes Relative 9 %   Monocytes Absolute 0.5 0.1 - 1.0 K/uL   Eosinophils Relative 9 %   Eosinophils Absolute 0.5 0.0 - 0.5 K/uL   Basophils Relative 1 %   Basophils Absolute 0.1 0.0 - 0.1 K/uL   Immature Granulocytes 0 %   Abs Immature Granulocytes 0.02 0.00 - 0.07 K/uL    Comment: Performed at [facility], [address] [place], [place] [address]  Comprehensive metabolic panel     Status: Abnormal   Collection Time: 07/13/19  2:02 AM  Result Value Ref Range   Sodium 133 (L) 135 - 145 mmol/L   Potassium 4.4 3.5 - 5.1 mmol/L   Chloride 99 98 - 111 mmol/L   CO2 27 22 - 32 mmol/L   Glucose, Bld 119 (H) 70 - 99 mg/dL   BUN 14 8 - 23 mg/dL   Creatinine, Ser 0.92 0.61 - 1.24 mg/dL   Calcium 8.1 (L) 8.9 - 10.3 mg/dL   Total Protein 6.6 6.5 - 8.1 g/dL   Albumin 4.0 3.5 - 5.0 g/dL   AST 28 15 - 41 U/L   ALT 25 0 - 44 U/L   Alkaline Phosphatase 105 38 - 126 U/L   Total Bilirubin 0.8 0.3 - 1.2 mg/dL   GFR calc non Af Amer >60 >60 mL/min   GFR calc Af Amer >60 >60 mL/min   Anion gap 7 5 - 15    Comment: Performed at [facility], [address] [place], [place] [address]  Brain natriuretic peptide     Status: Abnormal   Collection Time: 07/13/19  2:02 AM  Result Value Ref Range   B Natriuretic Peptide 109.0 (H) 0.0 - 100.0 pg/mL    Comment: Performed at [facility], [place], [place] [address]  Troponin I (High Sensitivity)     Status: None   Collection Time: 07/13/19  2:02 AM  Result Value Ref Range   Troponin I (High Sensitivity) 10 <18 ng/L    Comment: (NOTE) Elevated high sensitivity troponin I (hsTnI) values and significant  changes across serial measurements may suggest ACS but many other  chronic and acute conditions are known to elevate hsTnI results.  Refer to the "Links" section for chest pain algorithms and additional  guidance. Performed at [facility], [address] [place], [place] [address]   Blood gas, venous     Status: Abnormal   Collection Time: 07/13/19  2:02 AM  Result Value Ref Range   FIO2 0.40    Delivery systems BILEVEL POSITIVE AIRWAY PRESSURE    pH, Ven 7.37 7.250 - 7.430   pCO2, Ven 52 44.0 - 60.0 mmHg   pO2, Ven 42.0 32.0 - 45.0 mmHg   Bicarbonate 30.1 (H) 20.0 - 28.0 mmol/L   Acid-Base Excess 3.6 (H) 0.0 - 2.0 mmol/L   O2 Saturation 75.8 %   Patient temperature 37.0    Collection site VENOUS    Sample type VENOUS    Mechanical Rate 8     Comment: Performed at [facility], [address] [place], [place] [address]  SARS Coronavirus 2 (CEPHEID - Performed in [place] hospital lab), Hosp Order     Status: None   Collection Time: 07/13/19  2:02 AM   Specimen: Nasopharyngeal Swab  Result Value Ref Range   SARS Coronavirus 2  NEGATIVE NEGATIVE    Comment: (NOTE) If result is NEGATIVE SARS-CoV-2 target nucleic acids are NOT DETECTED. The SARS-CoV-2 RNA is generally detectable in upper and lower  respiratory specimens during the acute phase of infection. The lowest  concentration of SARS-CoV-2 viral copies this assay can detect is 250  copies / mL. A negative result does not preclude SARS-CoV-2 infection  and should not be used as the sole basis for treatment or other  patient management decisions.  A negative result may occur with  improper specimen  collection / handling, submission of specimen other  than nasopharyngeal swab, presence of viral mutation(s) within the  areas targeted by this assay, and inadequate number of viral copies  (<250 copies / mL). A negative result must be combined with clinical  observations, patient history, and epidemiological information. If result is POSITIVE SARS-CoV-2 target nucleic acids are DETECTED. The SARS-CoV-2 RNA is generally detectable in upper and lower  respiratory specimens dur ing the acute phase of infection.  Positive  results are indicative of active infection with SARS-CoV-2.  Clinical  correlation with patient history and other diagnostic information is  necessary to determine patient infection status.  Positive results do  not rule out bacterial infection or co-infection with other viruses. If result is PRESUMPTIVE POSTIVE SARS-CoV-2 nucleic acids MAY BE PRESENT.   A presumptive positive result was obtained on the submitted specimen  and confirmed on repeat testing.  While 2019 novel coronavirus  (SARS-CoV-2) nucleic acids may be present in the submitted sample  additional confirmatory testing may be necessary for epidemiological  and / or clinical management purposes  to differentiate between  SARS-CoV-2 and other Sarbecovirus currently known to infect humans.  If clinically indicated additional testing with an alternate test  methodology [phone]) is advised. The SARS-CoV-2 RNA is generally  detectable in upper and lower respiratory sp ecimens during the acute  phase of infection. The expected result is Negative. Fact Sheet for Patients:  StrictlyIdeas.no Fact Sheet for Healthcare Providers: BankingDealers.co.za This test is not yet approved or cleared by the [place] FDA and has been authorized for detection and/or diagnosis of SARS-CoV-2 by FDA under an Emergency Use Authorization (EUA).  This EUA will remain in effect  (meaning this test can be used) for the duration of the COVID-19 declaration under Section 564(b)(1) of the Act, 21 U.S.C. section 360bbb-3(b)(1), unless the authorization is terminated or revoked sooner. Performed at [facility], [place], [place] [address]   Troponin I (High Sensitivity)     Status: None   Collection Time: 07/13/19  3:52 AM  Result Value Ref Range   Troponin I (High Sensitivity) 6 <18 ng/L    Comment: (NOTE) Elevated high sensitivity troponin I (hsTnI) values and significant  changes across serial measurements may suggest ACS but many other  chronic and acute conditions are known to elevate hsTnI results.  Refer to the "Links" section for chest pain algorithms and additional  guidance. Performed at [facility], [address] [place], [place] [address]    Dg Chest Portable 1 View  Result Date: 07/13/2019 CLINICAL DATA:  Shortness of breath. EXAM: PORTABLE CHEST 1 VIEW COMPARISON:  12/02/2018 FINDINGS: The lung volumes are low. There are bibasilar airspace opacities. There may be trace bilateral pleural effusions. The heart size is enlarged. The patient is status post prior median sternotomy. Aortic calcifications are noted. There is mild volume overload. There are advanced degenerative changes of the glenohumeral joints, left worse than  right. IMPRESSION: 1. Cardiomegaly with mild volume overload. 2. Low lung volumes with bibasilar airspace opacities favored to represent atelectasis with infiltrate not excluded. Electronically Signed   By: Constance Holster M.D.   On: 07/13/2019 02:30    Review of Systems  Constitutional: Negative for chills and fever.  HENT: Negative for sore throat and tinnitus.   Eyes: Negative for blurred vision and redness.  Respiratory: Positive for cough and shortness of breath.   Cardiovascular: Negative for chest pain, palpitations, orthopnea and PND.  Gastrointestinal: Negative for abdominal pain,  diarrhea, nausea and vomiting.  Genitourinary: Negative for dysuria, frequency and urgency.  Musculoskeletal: Negative for joint pain and myalgias.  Skin: Negative for rash.       No lesions  Neurological: Negative for speech change, focal weakness and weakness.  Endo/Heme/Allergies: Does not bruise/bleed easily.       No temperature intolerance  Psychiatric/Behavioral: Negative for depression and suicidal ideas.    Blood pressure (!) 159/74, pulse 84, temperature 97.9 F (36.6 C), temperature source Oral, resp. rate 20, height 5' 8.5" (1.74 m), weight 81 kg, SpO2 100 %. Physical Exam  Vitals reviewed. Constitutional: He is oriented to person, place, and time. He appears well-developed and well-nourished. No distress.  HENT:  Head: Normocephalic and atraumatic.  Mouth/Throat: Oropharynx is clear and moist.  Eyes: Pupils are equal, round, and reactive to light. Conjunctivae and EOM are normal. No scleral icterus.  Neck: Normal range of motion. Neck supple. No JVD present. No tracheal deviation present. No thyromegaly present.  Cardiovascular: Normal rate and regular rhythm. Exam reveals no gallop and no friction rub.  No murmur heard. Respiratory: Effort normal and breath sounds normal. No respiratory distress.  GI: Soft. Bowel sounds are normal. He exhibits no distension. There is no abdominal tenderness.  Genitourinary:    Genitourinary Comments: Deferred   Musculoskeletal: Normal range of motion.  Lymphadenopathy:    He has no cervical adenopathy.  Neurological: He is alert and oriented to person, place, and time. No cranial nerve deficit.  Skin: Skin is warm and dry. No rash noted. No erythema.  Psychiatric: He has a normal mood and affect. His behavior is normal. Judgment and thought content normal.     Assessment/Plan This is an 85 year old male admitted for respiratory failure. 1.  Respiratory failure: Acute on chronic; with hypoxia.  The patient has weaned off of BiPAP and  is breathing comfortably.  Continue steroids as well as azithromycin for anti-inflammatory effect.  Scheduled albuterol treatments.  Continue long-acting bronchial agonist with inhaled corticosteroid and anticholinergic. 2.  Hypertension: Acceptable for age.  Continue hydrochlorothiazide and benazepril. 3.  Hyperlipidemia: Continue statin therapy 4.  History of stroke: Continue Eliquis 5.  BPH: Continue tamsulosin 6.  DVT prophylaxis: Therapeutic anticoagulation 7.  GI prophylaxis: None The patient is a full code.  Time spent on admission orders and patient care approximately 45 minutes  Harrie Foreman, MD 07/13/2019, 7:05 AM

## 2019-07-13 NOTE — ED Provider Notes (Signed)
[facility] Emergency Department Provider Note  ____________________________________________   First MD Initiated Contact with Patient 07/13/19 0148     (approximate)  I have reviewed the triage vital signs and the nursing notes.   HISTORY  Chief Complaint No chief complaint on file.    HPI James [name] is a 85 y.o. male  With PMHx COPD, pAFib, CHF, CAD here with SOB. Pt reports that he has been increasingly short of breath w/ dry cough for the past 2-3 days. He did not want to come to the ED so has been trying to treat at home, using his albuterol rescue inhaler every 4 hours. Over the past 24 hours, he's had progressive worsening of his SOB with sensation like he cannot catch his breath. He had increased WOB tonight so called EMS. He was found to be hypoxic to mid 80s on EMS arrival. Denies any CP, fever, sputum production. He's had some mild swelling in his legs. Sx worse w/ exertion. Transient relief noted w/ albuterol. Received nebs and BIPAP en route w/ improvement.        Past Medical History:  Diagnosis Date   Arthritis    Back pain    CAD (coronary artery disease)    Cataract    right   COPD (chronic obstructive pulmonary disease) ([place])    SPiriva and SYmbicort daily. Albuterol as needed   Diverticulosis    Dyspnea    with exertion   Enlarged prostate    takes Flomax daily   GERD (gastroesophageal reflux disease)    History of chicken pox    History of gout    History of measles    History of mumps    HOH (hard of hearing)    Hyperglycemia    Hyperlipidemia    takes Atorvastatin daily   Hypertension    takes Metoprolol daily as well as Lotensin HCT   Hypotension    Joint pain    Microscopic colitis    PAF (paroxysmal atrial fibrillation) ([place]), RVR 03/14/2017   Pneumonia    Prostate cancer ([place])    Stroke ([place])    TIA   TIA (transient ischemic attack)    Weakness    numbness and tingling.mainly on  right    Patient Active Problem List   Diagnosis Date Noted   Chest pain 12/02/2018   Hyponatremia 12/02/2018   AKI (acute kidney injury) ([place]) 12/02/2018   Primary adenocarcinoma of lower lobe of right lung ([place]) 02/16/2018   Late effect of cerebrovascular accident (CVA) 11/14/2017   CAP (community acquired pneumonia) 11/06/2017   Cerebral vascular disease 10/31/2017   CVA (cerebral vascular accident) ([place]) 10/31/2017   Acute CVA (cerebrovascular accident) ([place]) 10/30/2017   Lung mass 09/11/2017   Hematoma of neck 03/14/2017   PAF (paroxysmal atrial fibrillation) ([place]) 22/97/9892   Chronic systolic heart failure ([place]) 03/14/2017   Dysphagia 03/13/2017   Cervical spondylosis with myelopathy 03/07/2017   Restrictive lung disease 09/26/2016   Cervical neck pain with evidence of disc disease 07/04/2016   PVC (premature ventricular contraction) 11/16/2015   Abdominal pain 07/27/2015   Adenocarcinoma of prostate ([place]) 07/27/2015   Arthritis 07/27/2015   Blood in feces 07/27/2015   BPH (benign prostatic hyperplasia) 07/27/2015   COPD (chronic obstructive pulmonary disease) ([place]) 07/27/2015   GERD (gastroesophageal reflux disease) 07/27/2015   Gout 07/27/2015   Hyperglycemia 07/27/2015   Hypotension 07/27/2015   Insomnia 07/27/2015   Shortness of breath 07/27/2015   TIA (transient ischemic  attack) 07/27/2015   Urinary hesitancy 09/02/2012   ED (erectile dysfunction) of organic origin 06/10/2010   CAD (coronary artery disease) 03/14/2010   LBP (low back pain) 03/01/2009   Allergic rhinitis 09/28/2008   Essential (primary) hypertension 08/02/2007   Hyperlipidemia 08/02/2007   Acquired spondylolisthesis 03/20/2005   Diverticulosis of colon without hemorrhage 12/18/2000   Arthropathy 12/18/1998    Past Surgical History:  Procedure Laterality Date   ANTERIOR CERVICAL DECOMP/DISCECTOMY FUSION N/A 03/07/2017   Procedure: ANTERIOR CERVICAL  DECOMPRESSION/DISCECTOMY FUSION CERVICAL THREE- CERVICAL FOUR, CERVICAL FOUR- CERVICAL FIVE;  Surgeon: Newman Pies, MD;  Location: [place];  Service: Neurosurgery;  Laterality: N/A;  ANTERIOR CERVICAL DECOMPRESSION/DISCECTOMY FUSION CERVICAL 3- CERVICAL 4, CERVIACL 4- CERVICAL 5   APPENDECTOMY  1950   CARDIAC CATHETERIZATION  05/29/2013   EF=40-45%. Moderate pulmonary hypertension. Infero/ lateral hypokinesis   cataract surgery Left    COLONOSCOPY     CORONARY ARTERY BYPASS GRAFT  2011   x 5   ESOPHAGOGASTRODUODENOSCOPY (EGD) WITH PROPOFOL N/A 07/01/2018   Procedure: ESOPHAGOGASTRODUODENOSCOPY (EGD) WITH PROPOFOL;  Surgeon: Lin Landsman, MD;  Location: [place];  Service: Gastroenterology;  Laterality: N/A;   EYE SURGERY     FLEXIBLE BRONCHOSCOPY N/A 11/19/2017   Procedure: FLEXIBLE BRONCHOSCOPY;  Surgeon: Laverle Hobby, MD;  Location: [facility];  Service: Pulmonary;  Laterality: N/A;   MRI BRAIN  06/07/2013   Mild chronic involutional changes. No acute abnormalities   MRI of neck     Myocardial Perfusion Scan  05/29/2013   Abnormal myocardial perfusion image consistent with myocardial infarction   PROSTATE SURGERY  11/18/2012   radiation seed   TOTAL HIP ARTHROPLASTY Left 1997   hip joint replacement    Prior to Admission medications   Medication Sig Start Date End Date Taking? Authorizing Provider  albuterol (PROAIR HFA) 108 (90 Base) MCG/ACT inhaler TAKE 2 PUFFS INTO LUNGS EVERY 6 HOURS ASNEEDED FOR WHEEZING OR SHORTNESS OF BREATH 12/10/18   Laverle Hobby, MD  atorvastatin (LIPITOR) 80 MG tablet TAKE 1 TABLET BY MOUTH DAILY GENERIC EQUIVALENT FOR LIPITOR 12/17/18   Birdie Sons, MD  azelastine (OPTIVAR) 0.05 % ophthalmic solution APPLY ONE DROP TO EYE TWICE DAILY 12/10/18   Birdie Sons, MD  benazepril-hydrochlorthiazide (LOTENSIN HCT) 20-12.5 MG tablet TAKE ONE TABLET BY MOUTH EVERY DAY 10/27/18   Birdie Sons, MD  beta carotene  w/minerals (OCUVITE) tablet Take 1 tablet by mouth daily.    [provider]  chlorpheniramine-HYDROcodone (TUSSIONEX) 10-8 MG/5ML SUER TAKE 1 TEASPOONFUL (5ML) BY MOUTH EVERY 12 HOURS IF NEEDED FOR COUGH 03/12/19   Birdie Sons, MD  ELIQUIS 5 MG TABS tablet TAKE 1 TABLET BY MOUTH TWICE DAILY 04/28/19   Birdie Sons, MD  famotidine (PEPCID) 20 MG tablet TAKE 1-2 TABLETS BY MOUTH DAILY FOR COUGH 06/11/19   Birdie Sons, MD  fluticasone (FLONASE) 50 MCG/ACT nasal spray Place 2 sprays into both nostrils 2 (two) times daily. 04/22/19   Birdie Sons, MD  Fluticasone-Umeclidin-Vilant (TRELEGY ELLIPTA) 100-62.5-25 MCG/INH AEPB Inhale 1 applicator into the lungs daily. Rinse mouth after use. Stop symbicort and spiriva when taking this medicine. 09/03/18   Laverle Hobby, MD  lansoprazole (PREVACID) 30 MG capsule TAKE 1 CAPSULE EVERY DAY 04/28/19   Birdie Sons, MD  LORazepam (ATIVAN) 1 MG tablet Take 1 tablet (1 mg total) by mouth at bedtime. 04/09/19   Birdie Sons, MD  Magnesium 400 MG CAPS Take 1 capsule by mouth daily.  [provider]  metoprolol tartrate (LOPRESSOR) 50 MG tablet TAKE ONE TABLET BY MOUTH TWICE DAILY 11/10/18   Birdie Sons, MD  montelukast (SINGULAIR) 10 MG tablet Take 1 tablet (10 mg total) by mouth at bedtime. 04/28/19   Birdie Sons, MD  MULTIPLE VITAMIN PO Take 1 tablet by mouth daily.     [provider]  Omega-3 Fatty Acids (FISH OIL) 1000 MG CAPS Take 1 capsule daily by mouth.    [provider]  SPIRIVA HANDIHALER 18 MCG inhalation capsule INHALE CONTENTS OF 1 CAPSULE AS DIRECTEDONCE A DAY VIA HANDIHALER 03/12/19   Birdie Sons, MD  tamsulosin (FLOMAX) 0.4 MG CAPS capsule Take 1 capsule (0.4 mg total) by mouth daily. 01/31/19   Birdie Sons, MD  traMADol (ULTRAM) 50 MG tablet Take 1 tablet (50 mg total) by mouth every 6 (six) hours as needed for moderate pain or severe pain. 12/04/18   Gladstone Lighter,  MD    Allergies Indomethacin  Family History  Problem Relation Age of Onset   Heart attack Mother    Stroke Father    Heart attack Father    Hypertension Father    Heart attack Sister    Bladder Cancer Brother    Kidney cancer Brother    Heart Problems Brother     Social History Social History   Tobacco Use   Smoking status: Former Smoker    Packs/day: 1.00    Years: 11.00    Pack years: 11.00    Types: Cigarettes    Quit date: 02/12/1984    Years since quitting: 35.4   Smokeless tobacco: Never Used   Tobacco comment: quit smoking 30 yrs ago  Substance Use Topics   Alcohol use: Yes    Alcohol/week: 0.0 standard drinks    Comment: beer occasionally   Drug use: No    Review of Systems  Review of Systems  Constitutional: Positive for fatigue. Negative for chills and fever.  HENT: Negative for sore throat.   Respiratory: Positive for cough, shortness of breath and wheezing.   Cardiovascular: Negative for chest pain.  Gastrointestinal: Negative for abdominal pain.  Genitourinary: Negative for flank pain.  Musculoskeletal: Negative for neck pain.  Skin: Negative for rash and wound.  Allergic/Immunologic: Negative for immunocompromised state.  Neurological: Positive for weakness. Negative for numbness.  Hematological: Does not bruise/bleed easily.     ____________________________________________  PHYSICAL EXAM:      VITAL SIGNS: ED Triage Vitals  Enc Vitals Group     BP      Pulse      Resp      Temp      Temp src      SpO2      Weight      Height      Head Circumference      Peak Flow      Pain Score      Pain Loc      Pain Edu?      Excl. in [place]?      Physical Exam Vitals signs and nursing note reviewed.  Constitutional:      General: He is not in acute distress.    Appearance: He is well-developed.  HENT:     Head: Normocephalic and atraumatic.  Eyes:     Conjunctiva/sclera: Conjunctivae normal.  Neck:     Musculoskeletal:  Neck supple.  Cardiovascular:     Rate and Rhythm: Normal rate and regular rhythm.  Heart sounds: Normal heart sounds. No murmur. No friction rub.  Pulmonary:     Effort: Tachypnea, accessory muscle usage and respiratory distress present.     Breath sounds: Decreased air movement present. Wheezing present. No rales.  Abdominal:     General: There is no distension.     Palpations: Abdomen is soft.     Tenderness: There is no abdominal tenderness.  Musculoskeletal:     Right lower leg: Edema (1+ pitting) present.     Left lower leg: Edema (1+ pitting) present.  Skin:    General: Skin is warm.     Capillary Refill: Capillary refill takes less than 2 seconds.  Neurological:     Mental Status: He is alert and oriented to person, place, and time.     Motor: No abnormal muscle tone.       ____________________________________________   LABS (all labs ordered are listed, but only abnormal results are displayed)  Labs Reviewed  SARS CORONAVIRUS 2 ([facility])  CBC WITH DIFFERENTIAL/PLATELET  COMPREHENSIVE METABOLIC PANEL  BRAIN NATRIURETIC PEPTIDE  BLOOD GAS, VENOUS  TROPONIN I (HIGH SENSITIVITY)    ____________________________________________  EKG: Normal sinus rhythm, VR 66. QRS 146. QTc 431. No acute change from prior ________________________________________  RADIOLOGY All imaging, including plain films, CT scans, and ultrasounds, independently reviewed by me, and interpretations confirmed via formal radiology reads.  ED MD interpretation:   CXR: Mild edema  Official radiology report(s): No results found.  ____________________________________________  PROCEDURES   Procedure(s) performed (including Critical Care):  .Critical Care Performed by: Duffy Bruce, MD Authorized by: Duffy Bruce, MD   Critical care provider statement:    Critical care time (minutes):  35   Critical care time was exclusive of:   Separately billable procedures and treating other patients and teaching time   Critical care was necessary to treat or prevent imminent or life-threatening deterioration of the following conditions:  Cardiac failure, circulatory failure and respiratory failure   Critical care was time spent personally by me on the following activities:  Development of treatment plan with patient or surrogate, discussions with consultants, evaluation of patient's response to treatment, examination of patient, obtaining history from patient or surrogate, ordering and performing treatments and interventions, ordering and review of laboratory studies, ordering and review of radiographic studies, pulse oximetry, re-evaluation of patient's condition and review of old charts   I assumed direction of critical care for this patient from another provider in my specialty: no      ____________________________________________  INITIAL IMPRESSION / MDM / [place] / ED COURSE  As part of my medical decision making, I reviewed the following data within the electronic MEDICAL RECORD NUMBER Notes from prior ED visits and [place] Controlled Substance Database      *KELSON QUEENAN was evaluated in Emergency Department on 07/13/2019 for the symptoms described in the history of present illness. He was evaluated in the context of the global COVID-19 pandemic, which necessitated consideration that the patient might be at risk for infection with the SARS-CoV-2 virus that causes COVID-19. Institutional protocols and algorithms that pertain to the evaluation of patients at risk for COVID-19 are in a state of rapid change based on information released by regulatory bodies including the CDC and federal and state organizations. These policies and algorithms were followed during the patient's care in the ED.  Some ED evaluations and interventions may be delayed as a result of limited staffing during the pandemic.*  Clinical Course as of Jul 12 321  Sun Jul 13, 2019  0252 Glucose(!): 119 [CI]    Clinical Course User Index [CI] Duffy Bruce, MD    Medical Decision Making: 85 year old male here with acute on chronic hypoxic respiratory failure.  Patient on BiPAP on arrival which seems to have significantly improved his work of breathing.  Chest x-ray shows mild edema and I suspect his acute on chronic hypoxia is secondary to CHF with likely COPD.  Given his soft blood pressures will hold on Lasix at this time while he is requiring BiPAP.  Steroids and nebs given with improvement.  He is diffusely wheezy, which is more consistent with COPD.  He has no infectious symptoms, is COVID negative, no leukocytosis or evidence to suggest pneumonia or superimposed infection.  Admit to medicine.  ____________________________________________  FINAL CLINICAL IMPRESSION(S) / ED DIAGNOSES  Final diagnoses:  None     MEDICATIONS GIVEN DURING THIS VISIT:  Medications  methylPREDNISolone sodium succinate (SOLU-MEDROL) 125 mg/2 mL injection 125 mg (has no administration in time range)  ipratropium-albuterol (DUONEB) 0.5-2.5 (3) MG/3ML nebulizer solution 3 mL (has no administration in time range)     ED Discharge Orders    None       Note:  This document was prepared using Dragon voice recognition software and may include unintentional dictation errors.   Duffy Bruce, MD 07/13/19 [phone]

## 2019-07-13 NOTE — Progress Notes (Signed)
Reports his SOB is much improved. Ambulated in hall on RA  With pulse ox 92 % with HR 120's, Respiration slightly dysneic.  Up in chair room. No acute distress. Anticipating discharge home tomorrow.

## 2019-07-13 NOTE — Progress Notes (Signed)
[facility] [place] NAME: James [name]    MR#:  [id number]  DATE OF BIRTH:  1929-10-13  SUBJECTIVE:   Presented to the hospital secondary to worsening respiratory distress noted to have acute on chronic respiratory failure secondary to COPD exacerbation.  Patient says he feels a bit better today.  No other complaints presently.  REVIEW OF SYSTEMS:    Review of Systems  Constitutional: Negative for chills and fever.  HENT: Negative for congestion and tinnitus.   Eyes: Negative for blurred vision and double vision.  Respiratory: Positive for shortness of breath. Negative for cough and wheezing.   Cardiovascular: Negative for chest pain, orthopnea and PND.  Gastrointestinal: Negative for abdominal pain, diarrhea, nausea and vomiting.  Genitourinary: Negative for dysuria and hematuria.  Neurological: Negative for dizziness, sensory change and focal weakness.  All other systems reviewed and are negative.   Nutrition: Heart Healthy Tolerating Diet: Yes Tolerating PT: Ambulatory  DRUG ALLERGIES:   Allergies  Allergen Reactions  . Indomethacin Nausea Only    VITALS:  Blood pressure (!) 152/86, pulse (!) 122, temperature 97.9 F (36.6 C), temperature source Oral, resp. rate 20, height 5' 8.5" (1.74 m), weight 81 kg, SpO2 92 %.  PHYSICAL EXAMINATION:   Physical Exam  GENERAL:  85 y.o.-year-old patient lying in bed in no acute distress.  EYES: Pupils equal, round, reactive to light and accommodation. No scleral icterus. Extraocular muscles intact.  HEENT: Head atraumatic, normocephalic. Oropharynx and nasopharynx clear.  NECK:  Supple, no jugular venous distention. No thyroid enlargement, no tenderness.  LUNGS: Good a/e B/l, no wheezing, rales, rhonchi. No use of accessory muscles of respiration.  CARDIOVASCULAR: S1, S2 normal. No murmurs, rubs, or gallops.  ABDOMEN: Soft, nontender, nondistended. Bowel sounds present. No organomegaly  or mass.  EXTREMITIES: No cyanosis, clubbing or edema b/l.    NEUROLOGIC: Cranial nerves II through XII are intact. No focal Motor or sensory deficits b/l.   PSYCHIATRIC: The patient is alert and oriented x 3.  SKIN: No obvious rash, lesion, or ulcer.    LABORATORY PANEL:   CBC Recent Labs  Lab 07/13/19 0202  WBC 5.0  HGB 12.4*  HCT 37.4*  PLT 158   ------------------------------------------------------------------------------------------------------------------  Chemistries  Recent Labs  Lab 07/13/19 0202  NA 133*  K 4.4  CL 99  CO2 27  GLUCOSE 119*  BUN 14  CREATININE 0.92  CALCIUM 8.1*  AST 28  ALT 25  ALKPHOS 105  BILITOT 0.8   ------------------------------------------------------------------------------------------------------------------  Cardiac Enzymes No results for input(s): TROPONINI in the last 168 hours. ------------------------------------------------------------------------------------------------------------------  RADIOLOGY:  Dg Chest Portable 1 View  Result Date: 07/13/2019 CLINICAL DATA:  Shortness of breath. EXAM: PORTABLE CHEST 1 VIEW COMPARISON:  12/02/2018 FINDINGS: The lung volumes are low. There are bibasilar airspace opacities. There may be trace bilateral pleural effusions. The heart size is enlarged. The patient is status post prior median sternotomy. Aortic calcifications are noted. There is mild volume overload. There are advanced degenerative changes of the glenohumeral joints, left worse than right. IMPRESSION: 1. Cardiomegaly with mild volume overload. 2. Low lung volumes with bibasilar airspace opacities favored to represent atelectasis with infiltrate not excluded. Electronically Signed   By: Constance Holster M.D.   On: 07/13/2019 02:30     ASSESSMENT AND PLAN:   85 year old male year old male with past medical history of hypertension, hyperlipidemia, previous TIA/CVA, history of prostate cancer, COPD, gout, coronary artery disease who  presented to the  hospital due to shortness of breath.  1.  Acute on chronic respiratory failure-secondary to COPD exacerbation. - Continue O2 supplementation, duo nebs, prednisone, maintenance inhalers with Breo and Incruse Ellipta -Patient is improving.  2.  COPD exacerbation-this is a cause of patient's worsening respiratory distress and hypoxemia. - Continue prednisone, duo nebs, maintenance inhalers.  Clinically improving.  Chest x-ray shows no evidence of acute pneumonia.  3.  Essential hypertension-continue benazepril, hydrochlorothiazide.  4.  BPH-continue Flomax.  5.  History of atrial fibrillation-rate controlled. -Continue metoprolol, continue Eliquis.  6.  Hyperlipidemia-continue atorvastatin.  All the records are reviewed and case discussed with Care Management/Social Worker. Management plans discussed with the patient, family and they are in agreement.  CODE STATUS: Full code  DVT Prophylaxis: Eliquis  TOTAL TIME TAKING CARE OF THIS PATIENT: 30 minutes.   POSSIBLE D/C IN 1-2 DAYS, DEPENDING ON CLINICAL CONDITION.   Henreitta Leber M.D on 07/13/2019 at 2:17 PM  Between 7am to 6pm - Pager - [phone]  After 6pm go to [email or web address] - Proofreader  [facility]  Office  [phone]  CC: Primary care physician; Birdie Sons, MD

## 2019-07-13 NOTE — ED Triage Notes (Signed)
Pt arrives via [facility] with c/o COPD exacerbation since yesterday. Pt reports that he has been taking his albuterol at home without relief. Per EMS, pt was 84% on RA upon arrival. Pt is alert and oriented at this time.

## 2019-07-13 NOTE — ED Notes (Signed)
ED TO INPATIENT HANDOFF REPORT  ED Nurse Name and Phone #: [phone] Name/Age/Gender James [name] 85 y.o. male Room/Bed: ED01A/ED01A  Code Status   Code Status: Prior  Home/SNF/Other Home Patient oriented to: self, place, time and situation Is this baseline? Yes   Triage Complete: Triage complete  Chief Complaint Breathing difficulty  Triage Note Pt arrives via [facility] with c/o COPD exacerbation since yesterday. Pt reports that he has been taking his albuterol at home without relief. Per EMS, pt was 84% on RA upon arrival. Pt is alert and oriented at this time.    Allergies Allergies  Allergen Reactions  . Indomethacin Nausea Only    Level of Care/Admitting Diagnosis ED Disposition    ED Disposition Condition [facility]: [place] [[id number]]  Level of Care: Med-Surg [16]  Covid Evaluation: Confirmed COVID Negative  Diagnosis: Acute on chronic respiratory failure with hypoxemia [facility]) [[id number]]  Admitting Physician: Harrie Foreman [[id number]]  Attending Physician: Harrie Foreman [[id number]]  Estimated length of stay: past midnight tomorrow  Certification:: I certify this patient will need inpatient services for at least 2 midnights  PT Class (Do Not Modify): Inpatient [101]  PT Acc Code (Do Not Modify): Private [1]       B Medical/Surgery History Past Medical History:  Diagnosis Date  . Arthritis   . Back pain   . CAD (coronary artery disease)   . Cataract    right  . COPD (chronic obstructive pulmonary disease) ([place])    SPiriva and SYmbicort daily. Albuterol as needed  . Diverticulosis   . Dyspnea    with exertion  . Enlarged prostate    takes Flomax daily  . GERD (gastroesophageal reflux disease)   . History of chicken pox   . History of gout   . History of measles   . History of mumps   . HOH (hard of hearing)   . Hyperglycemia   . Hyperlipidemia    takes Atorvastatin daily  . Hypertension     takes Metoprolol daily as well as Lotensin HCT  . Hypotension   . Joint pain   . Microscopic colitis   . PAF (paroxysmal atrial fibrillation) ([place]), RVR 03/14/2017  . Pneumonia   . Prostate cancer ([place])   . Stroke [facility])    TIA  . TIA (transient ischemic attack)   . Weakness    numbness and tingling.mainly on right   Past Surgical History:  Procedure Laterality Date  . ANTERIOR CERVICAL DECOMP/DISCECTOMY FUSION N/A 03/07/2017   Procedure: ANTERIOR CERVICAL DECOMPRESSION/DISCECTOMY FUSION CERVICAL THREE- CERVICAL FOUR, CERVICAL FOUR- CERVICAL FIVE;  Surgeon: Newman Pies, MD;  Location: [name];  Service: Neurosurgery;  Laterality: N/A;  ANTERIOR CERVICAL DECOMPRESSION/DISCECTOMY FUSION CERVICAL 3- CERVICAL 4, CERVIACL 4- CERVICAL 5  . APPENDECTOMY  1950  . CARDIAC CATHETERIZATION  05/29/2013   EF=40-45%. Moderate pulmonary hypertension. Infero/ lateral hypokinesis  . cataract surgery Left   . COLONOSCOPY    . CORONARY ARTERY BYPASS GRAFT  2011   x 5  . ESOPHAGOGASTRODUODENOSCOPY (EGD) WITH PROPOFOL N/A 07/01/2018   Procedure: ESOPHAGOGASTRODUODENOSCOPY (EGD) WITH PROPOFOL;  Surgeon: Lin Landsman, MD;  Location: [place];  Service: Gastroenterology;  Laterality: N/A;  . EYE SURGERY    . FLEXIBLE BRONCHOSCOPY N/A 11/19/2017   Procedure: FLEXIBLE BRONCHOSCOPY;  Surgeon: Laverle Hobby, MD;  Location: [facility];  Service: Pulmonary;  Laterality: N/A;  . MRI BRAIN  06/07/2013   Mild chronic involutional  changes. No acute abnormalities  . MRI of neck    . Myocardial Perfusion Scan  05/29/2013   Abnormal myocardial perfusion image consistent with myocardial infarction  . PROSTATE SURGERY  11/18/2012   radiation seed  . TOTAL HIP ARTHROPLASTY Left 1997   hip joint replacement     A IV Location/Drains/Wounds Patient Lines/Drains/Airways Status   Active Line/Drains/Airways    Name:   Placement date:   Placement time:   Site:   Days:   Peripheral IV 07/13/19 Left  Antecubital   07/13/19    0200    Antecubital   less than 1   Incision (Closed) 03/07/17 Neck Other (Comment)   03/07/17    1351     858          Intake/Output Last 24 hours No intake or output data in the 24 hours ending 07/13/19 0410  Labs/Imaging Results for orders placed or performed during the hospital encounter of 07/13/19 (from the past 48 hour(s))  CBC with Differential     Status: Abnormal   Collection Time: 07/13/19  2:02 AM  Result Value Ref Range   WBC 5.0 4.0 - 10.5 K/uL   RBC 4.21 (L) 4.22 - 5.81 MIL/uL   Hemoglobin 12.4 (L) 13.0 - 17.0 g/dL   HCT 37.4 (L) 39.0 - 52.0 %   MCV 88.8 80.0 - 100.0 fL   MCH 29.5 26.0 - 34.0 pg   MCHC 33.2 30.0 - 36.0 g/dL   RDW 14.6 11.5 - 15.5 %   Platelets 158 150 - 400 K/uL   nRBC 0.0 0.0 - 0.2 %   Neutrophils Relative % 60 %   Neutro Abs 3.0 1.7 - 7.7 K/uL   Lymphocytes Relative 21 %   Lymphs Abs 1.0 0.7 - 4.0 K/uL   Monocytes Relative 9 %   Monocytes Absolute 0.5 0.1 - 1.0 K/uL   Eosinophils Relative 9 %   Eosinophils Absolute 0.5 0.0 - 0.5 K/uL   Basophils Relative 1 %   Basophils Absolute 0.1 0.0 - 0.1 K/uL   Immature Granulocytes 0 %   Abs Immature Granulocytes 0.02 0.00 - 0.07 K/uL    Comment: Performed at [facility], [address] [place], [place] [address]  Comprehensive metabolic panel     Status: Abnormal   Collection Time: 07/13/19  2:02 AM  Result Value Ref Range   Sodium 133 (L) 135 - 145 mmol/L   Potassium 4.4 3.5 - 5.1 mmol/L   Chloride 99 98 - 111 mmol/L   CO2 27 22 - 32 mmol/L   Glucose, Bld 119 (H) 70 - 99 mg/dL   BUN 14 8 - 23 mg/dL   Creatinine, Ser 0.92 0.61 - 1.24 mg/dL   Calcium 8.1 (L) 8.9 - 10.3 mg/dL   Total Protein 6.6 6.5 - 8.1 g/dL   Albumin 4.0 3.5 - 5.0 g/dL   AST 28 15 - 41 U/L   ALT 25 0 - 44 U/L   Alkaline Phosphatase 105 38 - 126 U/L   Total Bilirubin 0.8 0.3 - 1.2 mg/dL   GFR calc non Af Amer >60 >60 mL/min   GFR calc Af Amer >60 >60 mL/min   Anion gap 7 5 - 15     Comment: Performed at [facility], [address] [place], [place] [address]  Brain natriuretic peptide     Status: Abnormal   Collection Time: 07/13/19  2:02 AM  Result Value Ref Range   B Natriuretic Peptide 109.0 (  H) 0.0 - 100.0 pg/mL    Comment: Performed at [facility], [place], [place] [address]  Troponin I (High Sensitivity)     Status: None   Collection Time: 07/13/19  2:02 AM  Result Value Ref Range   Troponin I (High Sensitivity) 10 <18 ng/L    Comment: (NOTE) Elevated high sensitivity troponin I (hsTnI) values and significant  changes across serial measurements may suggest ACS but many other  chronic and acute conditions are known to elevate hsTnI results.  Refer to the "Links" section for chest pain algorithms and additional  guidance. Performed at [facility], [address] [place], [place] [address]   Blood gas, venous     Status: Abnormal   Collection Time: 07/13/19  2:02 AM  Result Value Ref Range   FIO2 0.40    Delivery systems BILEVEL POSITIVE AIRWAY PRESSURE    pH, Ven 7.37 7.250 - 7.430   pCO2, Ven 52 44.0 - 60.0 mmHg   pO2, Ven 42.0 32.0 - 45.0 mmHg   Bicarbonate 30.1 (H) 20.0 - 28.0 mmol/L   Acid-Base Excess 3.6 (H) 0.0 - 2.0 mmol/L   O2 Saturation 75.8 %   Patient temperature 37.0    Collection site VENOUS    Sample type VENOUS    Mechanical Rate 8     Comment: Performed at [facility], [address], [place], [place] [address]  SARS Coronavirus 2 (CEPHEID - Performed in [place] hospital lab), Hosp Order     Status: None   Collection Time: 07/13/19  2:02 AM   Specimen: Nasopharyngeal Swab  Result Value Ref Range   SARS Coronavirus 2 NEGATIVE NEGATIVE    Comment: (NOTE) If result is NEGATIVE SARS-CoV-2 target nucleic acids are NOT DETECTED. The SARS-CoV-2 RNA is generally detectable in upper and lower  respiratory specimens during the acute phase of infection. The lowest   concentration of SARS-CoV-2 viral copies this assay can detect is 250  copies / mL. A negative result does not preclude SARS-CoV-2 infection  and should not be used as the sole basis for treatment or other  patient management decisions.  A negative result may occur with  improper specimen collection / handling, submission of specimen other  than nasopharyngeal swab, presence of viral mutation(s) within the  areas targeted by this assay, and inadequate number of viral copies  (<250 copies / mL). A negative result must be combined with clinical  observations, patient history, and epidemiological information. If result is POSITIVE SARS-CoV-2 target nucleic acids are DETECTED. The SARS-CoV-2 RNA is generally detectable in upper and lower  respiratory specimens dur ing the acute phase of infection.  Positive  results are indicative of active infection with SARS-CoV-2.  Clinical  correlation with patient history and other diagnostic information is  necessary to determine patient infection status.  Positive results do  not rule out bacterial infection or co-infection with other viruses. If result is PRESUMPTIVE POSTIVE SARS-CoV-2 nucleic acids MAY BE PRESENT.   A presumptive positive result was obtained on the submitted specimen  and confirmed on repeat testing.  While 2019 novel coronavirus  (SARS-CoV-2) nucleic acids may be present in the submitted sample  additional confirmatory testing may be necessary for epidemiological  and / or clinical management purposes  to differentiate between  SARS-CoV-2 and other Sarbecovirus currently known to infect humans.  If clinically indicated additional testing with an alternate test  methodology [phone]) is advised. The SARS-CoV-2 RNA is generally  detectable in upper and lower respiratory sp ecimens during the acute  phase of infection. The expected result is Negative. Fact Sheet for Patients:  StrictlyIdeas.no Fact Sheet  for Healthcare Providers: BankingDealers.co.za This test is not yet approved or cleared by the [place] FDA and has been authorized for detection and/or diagnosis of SARS-CoV-2 by FDA under an Emergency Use Authorization (EUA).  This EUA will remain in effect (meaning this test can be used) for the duration of the COVID-19 declaration under Section 564(b)(1) of the Act, 21 U.S.C. section 360bbb-3(b)(1), unless the authorization is terminated or revoked sooner. Performed at [facility], [address], [place], [place] [address]    Dg Chest Portable 1 View  Result Date: 07/13/2019 CLINICAL DATA:  Shortness of breath. EXAM: PORTABLE CHEST 1 VIEW COMPARISON:  12/02/2018 FINDINGS: The lung volumes are low. There are bibasilar airspace opacities. There may be trace bilateral pleural effusions. The heart size is enlarged. The patient is status post prior median sternotomy. Aortic calcifications are noted. There is mild volume overload. There are advanced degenerative changes of the glenohumeral joints, left worse than right. IMPRESSION: 1. Cardiomegaly with mild volume overload. 2. Low lung volumes with bibasilar airspace opacities favored to represent atelectasis with infiltrate not excluded. Electronically Signed   By: Constance Holster M.D.   On: 07/13/2019 02:30    Pending Labs [employer] (From admission, onward)    Start     Ordered   Signed and Held  TSH  Add-on,   R     Signed and Held   Signed and Held  Hemoglobin A1c  Add-on,   R     Signed and Held          Vitals/Pain Today's Vitals   07/13/19 0230 07/13/19 0300 07/13/19 0330 07/13/19 0400  BP: (!) 99/59 (!) 104/51 (!) 102/51 126/79  Pulse: 68 71 72 68  Resp: _0 Temp:      TempSrc:      SpO2: 96% 94% 97% 99%  Weight:      Height:      PainSc:        Isolation Precautions No active isolations  Medications Medications  methylPREDNISolone sodium succinate  (SOLU-MEDROL) 125 mg/2 mL injection 125 mg (125 mg Intravenous Given 07/13/19 0227)  ipratropium-albuterol (DUONEB) 0.5-2.5 (3) MG/3ML nebulizer solution 3 mL (3 mLs Nebulization Given 07/13/19 0227)  ipratropium-albuterol (DUONEB) 0.5-2.5 (3) MG/3ML nebulizer solution 3 mL (3 mLs Nebulization Given 07/13/19 0352)    Mobility walks Low fall risk   Focused Assessments Pulmonary Assessment Handoff:  Lung sounds: Bilateral Breath Sounds: Diminished L Breath Sounds: Diminished R Breath Sounds: Diminished O2 Device: Bi-PAP        R Recommendations: See Admitting Provider Note  Report given to:   Additional Notes:

## 2019-07-13 NOTE — Plan of Care (Signed)
  Problem: Education: Goal: Knowledge of disease or condition will improve Outcome: Progressing Goal: Knowledge of the prescribed therapeutic regimen will improve Outcome: Progressing Goal: Individualized Educational Video(s) Outcome: Progressing   Problem: Activity: Goal: Ability to tolerate increased activity will improve Outcome: Progressing Goal: Will verbalize the importance of balancing activity with adequate rest periods Outcome: Progressing   Problem: Respiratory: Goal: Ability to maintain a clear airway will improve Outcome: Progressing Goal: Levels of oxygenation will improve Outcome: Progressing Goal: Ability to maintain adequate ventilation will improve Outcome: Progressing   Problem: Education: Goal: Knowledge of General Education information will improve Description: Including pain rating scale, medication(s)/side effects and non-pharmacologic comfort measures Outcome: Progressing   Problem: Health Behavior/Discharge Planning: Goal: Ability to manage health-related needs will improve Outcome: Progressing   Problem: Clinical Measurements: Goal: Ability to maintain clinical measurements within normal limits will improve Outcome: Progressing Goal: Will remain free from infection Outcome: Progressing Goal: Diagnostic test results will improve Outcome: Progressing   Problem: Pain Managment: Goal: General experience of comfort will improve Outcome: Progressing   Problem: Safety: Goal: Ability to remain free from injury will improve Outcome: Progressing   Problem: Skin Integrity: Goal: Risk for impaired skin integrity will decrease Outcome: Progressing

## 2019-07-13 NOTE — ED Notes (Signed)
Pt placed on 4L nasal cannula.

## 2019-07-14 DIAGNOSIS — J9621 Acute and chronic respiratory failure with hypoxia: Secondary | ICD-10-CM | POA: Diagnosis present

## 2019-07-14 DIAGNOSIS — Z8673 Personal history of transient ischemic attack (TIA), and cerebral infarction without residual deficits: Secondary | ICD-10-CM | POA: Diagnosis not present

## 2019-07-14 DIAGNOSIS — E785 Hyperlipidemia, unspecified: Secondary | ICD-10-CM | POA: Diagnosis not present

## 2019-07-14 DIAGNOSIS — I10 Essential (primary) hypertension: Secondary | ICD-10-CM | POA: Diagnosis not present

## 2019-07-14 MED ORDER — PHENOL 1.4 % MT LIQD
1.0000 | OROMUCOSAL | Status: DC | PRN
  Filled 2019-07-14: qty 177

## 2019-07-14 MED ORDER — ALBUTEROL SULFATE (2.5 MG/3ML) 0.083% IN NEBU
2.5000 mg | INHALATION_SOLUTION | Freq: Three times a day (TID) | RESPIRATORY_TRACT | Status: DC
  Administered 2019-07-14: 08:00:00 2.5 mg via RESPIRATORY_TRACT
  Filled 2019-07-14: qty 3

## 2019-07-14 MED ORDER — ALBUTEROL SULFATE (2.5 MG/3ML) 0.083% IN NEBU: 2.5000 mg | INHALATION_SOLUTION | Freq: Four times a day (QID) | RESPIRATORY_TRACT | Status: DC | PRN

## 2019-07-14 NOTE — Progress Notes (Signed)
Patient discharged home per MD order. Prescriptions given to patient. All discharge instructions given and all questions answered. 

## 2019-07-14 NOTE — Care Management CC44 (Signed)
Condition Code 44 Documentation Completed  Patient Details  Name: James [name] MRN: [id number] Date of Birth: 03-Jun-1929   Condition Code 44 given:  Yes Patient signature on Condition Code 44 notice:  Yes Documentation of 2 MD's agreement:  Yes Code 44 added to claim:  Yes    Shelbie Hutching, RN 07/14/2019, 11:52 AM

## 2019-07-14 NOTE — Discharge Summary (Signed)
[facility] [place] NAME: James [name]    MR#:  [id number]  DATE OF BIRTH:  11-29-1929  DATE OF ADMISSION:  07/13/2019 ADMITTING PHYSICIAN: Harrie Foreman, MD  DATE OF DISCHARGE: 07/14/2019  PRIMARY CARE PHYSICIAN: Birdie Sons, MD    ADMISSION DIAGNOSIS:  COPD exacerbation ([place]) [J44.1] Acute respiratory failure with hypoxia ([place]) [J96.01]  DISCHARGE DIAGNOSIS:  Active Problems:   Acute on chronic respiratory failure with hypoxemia (HCC)   Acute on chronic respiratory failure with hypoxia ([place])   SECONDARY DIAGNOSIS:   Past Medical History:  Diagnosis Date  . Arthritis   . Back pain   . CAD (coronary artery disease)   . Cataract    right  . COPD (chronic obstructive pulmonary disease) ([place])    SPiriva and SYmbicort daily. Albuterol as needed  . Diverticulosis   . Dyspnea    with exertion  . Enlarged prostate    takes Flomax daily  . GERD (gastroesophageal reflux disease)   . History of chicken pox   . History of gout   . History of measles   . History of mumps   . HOH (hard of hearing)   . Hyperglycemia   . Hyperlipidemia    takes Atorvastatin daily  . Hypertension    takes Metoprolol daily as well as Lotensin HCT  . Hypotension   . Joint pain   . Microscopic colitis   . PAF (paroxysmal atrial fibrillation) ([place]), RVR 03/14/2017  . Pneumonia   . Prostate cancer ([place])   . Stroke [facility])    TIA  . TIA (transient ischemic attack)   . Weakness    numbness and tingling.mainly on right    HOSPITAL COURSE:   85 year old male with past medical history of hypertension, hyperlipidemia, previous TIA/CVA, history of prostate cancer, COPD, gout, coronary artery disease who presented to the hospital due to shortness of breath.  1.  Acute on chronic respiratory failure-secondary to COPD exacerbation. -This was secondary to mild bronchitis, patient was treated with initially IV steroids then put on a prednisone taper and  maintained on his inhalers and has improved.  He is no longer hypoxic on ambulation therefore being discharged home on a small prednisone taper and empiric Zithromax for a few days.  2.  COPD exacerbation-this was the cause of patient's worsening respiratory distress and hypoxemia. -Patient was treated with IV steroids and then switched over to oral prednisone and has improved.  Chest x-ray did not show any runs of pneumonia, he was treated with some mild bronchitis with Zithromax.  He has no wheezing or bronchospasm now being discharged home on his maintenance inhalers and a prednisone taper.  3.  Essential hypertension-pt. Will continue benazepril, hydrochlorothiazide.  4.  BPH- pt. Will continue Flomax.  5.  History of atrial fibrillation-rate controlled. - pt. Will Continue metoprolol, continue Eliquis.  6.  Hyperlipidemia- pt. Will continue atorvastatin.  DISCHARGE CONDITIONS:   Stable.   CONSULTS OBTAINED:    DRUG ALLERGIES:   Allergies  Allergen Reactions  . Indomethacin Nausea Only    DISCHARGE MEDICATIONS:   Allergies as of 07/14/2019      Reactions   Indomethacin Nausea Only      Medication List    TAKE these medications   albuterol 108 (90 Base) MCG/ACT inhaler Commonly known as: ProAir HFA TAKE 2 PUFFS INTO LUNGS EVERY 6 HOURS ASNEEDED FOR WHEEZING OR SHORTNESS OF BREATH   atorvastatin 80 MG tablet Commonly known  as: LIPITOR TAKE 1 TABLET BY MOUTH DAILY GENERIC EQUIVALENT FOR LIPITOR   azelastine 0.05 % ophthalmic solution Commonly known as: OPTIVAR APPLY ONE DROP TO EYE TWICE DAILY   azithromycin 250 MG tablet Commonly known as: ZITHROMAX Take 1 tablet (250 mg total) by mouth daily for 3 days.   benazepril-hydrochlorthiazide 20-12.5 MG tablet Commonly known as: LOTENSIN HCT TAKE ONE TABLET BY MOUTH EVERY DAY   beta carotene w/minerals tablet Take 1 tablet by mouth daily.   Eliquis 5 MG Tabs tablet Generic drug: apixaban TAKE 1 TABLET BY  MOUTH TWICE DAILY   Fish Oil 1000 MG Caps Take 1 capsule daily by mouth.   fluticasone 50 MCG/ACT nasal spray Commonly known as: FLONASE Place 2 sprays into both nostrils 2 (two) times daily.   Fluticasone-Umeclidin-Vilant 100-62.5-25 MCG/INH Aepb Commonly known as: Trelegy Ellipta Inhale 1 applicator into the lungs daily. Rinse mouth after use. Stop symbicort and spiriva when taking this medicine.   lansoprazole 30 MG capsule Commonly known as: PREVACID TAKE 1 CAPSULE EVERY DAY   LORazepam 1 MG tablet Commonly known as: ATIVAN Take 1 tablet (1 mg total) by mouth at bedtime.   Magnesium 400 MG Caps Take 1 capsule by mouth daily.   metoprolol tartrate 50 MG tablet Commonly known as: LOPRESSOR TAKE ONE TABLET BY MOUTH TWICE DAILY   montelukast 10 MG tablet Commonly known as: SINGULAIR Take 1 tablet (10 mg total) by mouth at bedtime.   MULTIPLE VITAMIN PO Take 1 tablet by mouth daily.   predniSONE 10 MG tablet Commonly known as: DELTASONE Label  & dispense according to the schedule below. 5 Pills PO for 1 day then, 4 Pills PO for 1 day, 3 Pills PO for 1 day, 2 Pills PO for 1 day, 1 Pill PO for 1 days then STOP.   Spiriva HandiHaler 18 MCG inhalation capsule Generic drug: tiotropium INHALE CONTENTS OF 1 CAPSULE AS DIRECTEDONCE A DAY VIA HANDIHALER   Symbicort 160-4.5 MCG/ACT inhaler Generic drug: budesonide-formoterol Inhale 1 spray into the lungs 2 (two) times a day.   tamsulosin 0.4 MG Caps capsule Commonly known as: FLOMAX Take 1 capsule (0.4 mg total) by mouth daily.         DISCHARGE INSTRUCTIONS:   DIET:  Cardiac diet  DISCHARGE CONDITION:  Stable  ACTIVITY:  Activity as tolerated  OXYGEN:  Home Oxygen: No.   Oxygen Delivery: room air  DISCHARGE LOCATION:  home   If you experience worsening of your admission symptoms, develop shortness of breath, life threatening emergency, suicidal or homicidal thoughts you must seek medical attention  immediately by calling 911 or calling your MD immediately  if symptoms less severe.  You Must read complete instructions/literature along with all the possible adverse reactions/side effects for all the Medicines you take and that have been prescribed to you. Take any new Medicines after you have completely understood and accpet all the possible adverse reactions/side effects.   Please note  You were cared for by a hospitalist during your hospital stay. If you have any questions about your discharge medications or the care you received while you were in the hospital after you are discharged, you can call the unit and asked to speak with the hospitalist on call if the hospitalist that took care of you is not available. Once you are discharged, your primary care physician will handle any further medical issues. Please note that NO REFILLS for any discharge medications will be authorized once you are discharged, as it  is imperative that you return to your primary care physician (or establish a relationship with a primary care physician if you do not have one) for your aftercare needs so that they can reassess your need for medications and monitor your lab values.     Today   No shortness of breath, cough.  Feels much better.  Will discharge home today.  Patient is no longer hypoxic.  VITAL SIGNS:  Blood pressure (!) 142/73, pulse 73, temperature 97.7 F (36.5 C), temperature source Oral, resp. rate 16, height 5' 8.5" (1.74 m), weight 79.4 kg, SpO2 99 %.  I/O:    Intake/Output Summary (Last 24 hours) at 07/14/2019 1312 Last data filed at 07/14/2019 1024 Gross per 24 hour  Intake 240 ml  Output 1655 ml  Net -1415 ml    PHYSICAL EXAMINATION:   GENERAL:  85 y.o.-year-old patient lying in bed in no acute distress.  EYES: Pupils equal, round, reactive to light and accommodation. No scleral icterus. Extraocular muscles intact.  HEENT: Head atraumatic, normocephalic. Oropharynx and nasopharynx  clear.  NECK:  Supple, no jugular venous distention. No thyroid enlargement, no tenderness.  LUNGS: Good a/e B/l, no wheezing, rales, rhonchi. No use of accessory muscles of respiration.  CARDIOVASCULAR: S1, S2 normal. No murmurs, rubs, or gallops.  ABDOMEN: Soft, nontender, nondistended. Bowel sounds present. No organomegaly or mass.  EXTREMITIES: No cyanosis, clubbing or edema b/l.    NEUROLOGIC: Cranial nerves II through XII are intact. No focal Motor or sensory deficits b/l.   PSYCHIATRIC: The patient is alert and oriented x 3.  SKIN: No obvious rash, lesion, or ulcer.    DATA REVIEW:   CBC Recent Labs  Lab 07/13/19 0202  WBC 5.0  HGB 12.4*  HCT 37.4*  PLT 158    Chemistries  Recent Labs  Lab 07/13/19 0202  NA 133*  K 4.4  CL 99  CO2 27  GLUCOSE 119*  BUN 14  CREATININE 0.92  CALCIUM 8.1*  AST 28  ALT 25  ALKPHOS 105  BILITOT 0.8    Cardiac Enzymes No results for input(s): TROPONINI in the last 168 hours.  Microbiology Results  Results for orders placed or performed during the hospital encounter of 07/13/19  SARS Coronavirus 2 (CEPHEID - Performed in [place] [facility] hospital lab), Hosp Order     Status: None   Collection Time: 07/13/19  2:02 AM   Specimen: Nasopharyngeal Swab  Result Value Ref Range Status   SARS Coronavirus 2 NEGATIVE NEGATIVE Final    Comment: (NOTE) If result is NEGATIVE SARS-CoV-2 target nucleic acids are NOT DETECTED. The SARS-CoV-2 RNA is generally detectable in upper and lower  respiratory specimens during the acute phase of infection. The lowest  concentration of SARS-CoV-2 viral copies this assay can detect is 250  copies / mL. A negative result does not preclude SARS-CoV-2 infection  and should not be used as the sole basis for treatment or other  patient management decisions.  A negative result may occur with  improper specimen collection / handling, submission of specimen other  than nasopharyngeal swab, presence of viral  mutation(s) within the  areas targeted by this assay, and inadequate number of viral copies  (<250 copies / mL). A negative result must be combined with clinical  observations, patient history, and epidemiological information. If result is POSITIVE SARS-CoV-2 target nucleic acids are DETECTED. The SARS-CoV-2 RNA is generally detectable in upper and lower  respiratory specimens dur ing the acute phase of infection.  Positive  results are indicative of active infection with SARS-CoV-2.  Clinical  correlation with patient history and other diagnostic information is  necessary to determine patient infection status.  Positive results do  not rule out bacterial infection or co-infection with other viruses. If result is PRESUMPTIVE POSTIVE SARS-CoV-2 nucleic acids MAY BE PRESENT.   A presumptive positive result was obtained on the submitted specimen  and confirmed on repeat testing.  While 2019 novel coronavirus  (SARS-CoV-2) nucleic acids may be present in the submitted sample  additional confirmatory testing may be necessary for epidemiological  and / or clinical management purposes  to differentiate between  SARS-CoV-2 and other Sarbecovirus currently known to infect humans.  If clinically indicated additional testing with an alternate test  methodology [id number]) is advised. The SARS-CoV-2 RNA is generally  detectable in upper and lower respiratory sp ecimens during the acute  phase of infection. The expected result is Negative. Fact Sheet for Patients:  StrictlyIdeas.no Fact Sheet for Healthcare Providers: BankingDealers.co.za This test is not yet approved or cleared by the [place] FDA and has been authorized for detection and/or diagnosis of SARS-CoV-2 by FDA under an Emergency Use Authorization (EUA).  This EUA will remain in effect (meaning this test can be used) for the duration of the COVID-19 declaration under Section 564(b)(1)  of the Act, 21 U.S.C. section 360bbb-3(b)(1), unless the authorization is terminated or revoked sooner. Performed at [facility], [address], [place], [place] [address]     RADIOLOGY:  Dg Chest Portable 1 View  Result Date: 07/13/2019 CLINICAL DATA:  Shortness of breath. EXAM: PORTABLE CHEST 1 VIEW COMPARISON:  12/02/2018 FINDINGS: The lung volumes are low. There are bibasilar airspace opacities. There may be trace bilateral pleural effusions. The heart size is enlarged. The patient is status post prior median sternotomy. Aortic calcifications are noted. There is mild volume overload. There are advanced degenerative changes of the glenohumeral joints, left worse than right. IMPRESSION: 1. Cardiomegaly with mild volume overload. 2. Low lung volumes with bibasilar airspace opacities favored to represent atelectasis with infiltrate not excluded. Electronically Signed   By: Constance Holster M.D.   On: 07/13/2019 02:30      Management plans discussed with the patient, family and they are in agreement.  CODE STATUS:     Code Status Orders  (From admission, onward)         Start     Ordered   07/13/19 0448  Full code  Continuous     07/13/19 0447       TOTAL TIME TAKING CARE OF THIS PATIENT: 40 minutes.    Henreitta Leber M.D on 07/14/2019 at 1:12 PM  Between 7am to 6pm - Pager - [phone]  After 6pm go to [email or web address] - Proofreader  [facility]  Office  [phone]  CC: Primary care physician; Birdie Sons, MD

## 2019-07-14 NOTE — Care Management Obs Status (Signed)
[place] NOTIFICATION   Patient Details  Name: James [name] MRN: [id number] Date of Birth: Oct 28, 1929   Medicare Observation Status Notification Given:  Yes    Shelbie Hutching, RN 07/14/2019, 11:52 AM

## 2019-07-14 NOTE — TOC Transition Note (Signed)
Transition of Care [facility]) - CM/SW Discharge Note   Patient Details  Name: James [name] MRN: [id number] Date of Birth: 13-Oct-1929  [facility]) CM/SW Contact:  Shelbie Hutching, RN Phone Number: 07/14/2019, 11:52 AM   Clinical Narrative:     Patient to discharge home with wife.   Final next level of care: Home/Self Care Barriers to Discharge: Barriers Resolved   Patient Goals and CMS Choice Patient states their goals for this hospitalization and ongoing recovery are:: Hopefully go home today      Discharge Placement                       Discharge Plan and Services                                     Social Determinants of Health (SDOH) Interventions     Readmission Risk Interventions No flowsheet data found.

## 2019-07-14 NOTE — TOC Initial Note (Signed)
Transition of Care [facility]) - Initial/Assessment Note    Patient Details  Name: James [name] MRN: [id number] Date of Birth: 05-31-1929  Transition of Care [facility]) CM/SW Contact:    Shelbie Hutching, RN Phone Number: 07/14/2019, 10:23 AM  Clinical Narrative:                 Patient admitted with COPD exacerbation.  Patient is from home and lives with his wife.  Patient is independent and denies the need for any home health services or equipment.  Wife will provide transportation at discharge.    Expected Discharge Plan: Home/Self Care Barriers to Discharge: Continued Medical Work up   Patient Goals and CMS Choice Patient states their goals for this hospitalization and ongoing recovery are:: Hopefully go home today      Expected Discharge Plan and Services Expected Discharge Plan: Home/Self Care       Living arrangements for the past 2 months: Single Family Home Expected Discharge Date: 07/14/19                                    Prior Living Arrangements/Services Living arrangements for the past 2 months: Single Family Home Lives with:: Spouse Patient language and need for interpreter reviewed:: No Do you feel safe going back to the place where you live?: Yes      Need for Family Participation in Patient Care: Yes (Comment) Care giver support system in place?: Yes (comment)(wife)   Criminal Activity/Legal Involvement Pertinent to Current Situation/Hospitalization: No - Comment as needed  Activities of Daily Living Home Assistive Devices/Equipment: None ADL Screening (condition at time of admission) Patient's cognitive ability adequate to safely complete daily activities?: Yes Is the patient deaf or have difficulty hearing?: Yes Does the patient have difficulty seeing, even when wearing glasses/contacts?: No Does the patient have difficulty concentrating, remembering, or making decisions?: Yes Patient able to express need for assistance with ADLs?: Yes Does the  patient have difficulty dressing or bathing?: No Independently performs ADLs?: Yes (appropriate for developmental age) Does the patient have difficulty walking or climbing stairs?: No Weakness of Legs: None Weakness of Arms/Hands: Left  Permission Sought/Granted                  Emotional Assessment Appearance:: Appears stated age Attitude/Demeanor/Rapport: Engaged Affect (typically observed): Accepting Orientation: : Oriented to Self, Oriented to Place, Oriented to  Time, Oriented to Situation Alcohol / Substance Use: Not Applicable Psych Involvement: No (comment)  Admission diagnosis:  COPD exacerbation (HCC) [J44.1] Acute respiratory failure with hypoxia ([place]) [J96.01] Patient Active Problem List   Diagnosis Date Noted  . Acute on chronic respiratory failure with hypoxemia ([place]) 07/13/2019  . Chest pain 12/02/2018  . Hyponatremia 12/02/2018  . AKI (acute kidney injury) ([place]) 12/02/2018  . Primary adenocarcinoma of lower lobe of right lung ([place]) 02/16/2018  . Late effect of cerebrovascular accident (CVA) 11/14/2017  . CAP (community acquired pneumonia) 11/06/2017  . Cerebral vascular disease 10/31/2017  . CVA (cerebral vascular accident) ([place]) 10/31/2017  . Acute CVA (cerebrovascular accident) ([place]) 10/30/2017  . Lung mass 09/11/2017  . Hematoma of neck 03/14/2017  . PAF (paroxysmal atrial fibrillation) ([place]) 03/14/2017  . Chronic systolic heart failure ([place]) 03/14/2017  . Dysphagia 03/13/2017  . Cervical spondylosis with myelopathy 03/07/2017  . Restrictive lung disease 09/26/2016  . Cervical neck pain with evidence of disc disease 07/04/2016  . PVC (premature ventricular  contraction) 11/16/2015  . Abdominal pain 07/27/2015  . Adenocarcinoma of prostate ([place]) 07/27/2015  . Arthritis 07/27/2015  . Blood in feces 07/27/2015  . BPH (benign prostatic hyperplasia) 07/27/2015  . COPD (chronic obstructive pulmonary disease) ([place]) 07/27/2015  . GERD (gastroesophageal  reflux disease) 07/27/2015  . Gout 07/27/2015  . Hyperglycemia 07/27/2015  . Hypotension 07/27/2015  . Insomnia 07/27/2015  . Shortness of breath 07/27/2015  . TIA (transient ischemic attack) 07/27/2015  . Urinary hesitancy 09/02/2012  . ED (erectile dysfunction) of organic origin 06/10/2010  . CAD (coronary artery disease) 03/14/2010  . LBP (low back pain) 03/01/2009  . Allergic rhinitis 09/28/2008  . Essential (primary) hypertension 08/02/2007  . Hyperlipidemia 08/02/2007  . Acquired spondylolisthesis 03/20/2005  . Diverticulosis of colon without hemorrhage 12/18/2000  . Arthropathy 12/18/1998   PCP:  Birdie Sons, MD Pharmacy:   [place], [place] - [address] [place] [address] Phone: [phone] Fax: [phone]  [place], [place] [address] Phone: [phone] Fax: [phone]     Social Determinants of Health (SDOH) Interventions    Readmission Risk Interventions No flowsheet data found.

## 2019-07-14 NOTE — Progress Notes (Signed)
   07/14/19 0900  Clinical Encounter Type  Visited With Patient  Visit Type Initial  Ch was rounding. Pt was sitting in a chair watching TV. Pt was in good spirit waiting to be discharged to go home to his wife. Pt shared his concerns about the pandemic and wishes for a vaccine. Pt also said he tries to stay positive at all times. Ch gave him well wishes for his discharge.

## 2019-07-15 ENCOUNTER — Other Ambulatory Visit: Payer: Self-pay | Admitting: Family Medicine

## 2019-07-15 ENCOUNTER — Ambulatory Visit
Admission: RE | Admit: 2019-07-15 | Discharge: 2019-07-15 | Disposition: A | Discharge status: Home / Self Care | Payer: Medicare Other | Source: Ambulatory Visit | Attending: Oncology | Admitting: Oncology

## 2019-07-15 ENCOUNTER — Other Ambulatory Visit: Payer: Self-pay

## 2019-07-15 DIAGNOSIS — J439 Emphysema, unspecified: Secondary | ICD-10-CM | POA: Diagnosis not present

## 2019-07-15 DIAGNOSIS — C3431 Malignant neoplasm of lower lobe, right bronchus or lung: Secondary | ICD-10-CM | POA: Diagnosis not present

## 2019-07-15 DIAGNOSIS — J90 Pleural effusion, not elsewhere classified: Secondary | ICD-10-CM | POA: Diagnosis not present

## 2019-07-15 MED ORDER — IOHEXOL 300 MG/ML  SOLN
75.0000 mL | Freq: Once | INTRAMUSCULAR | Status: AC | PRN
  Administered 2019-07-15: 75 mL via INTRAVENOUS

## 2019-07-23 ENCOUNTER — Encounter: Payer: Self-pay | Admitting: Family Medicine

## 2019-07-23 ENCOUNTER — Ambulatory Visit: Payer: Medicare Other | Admitting: Family Medicine

## 2019-07-23 ENCOUNTER — Other Ambulatory Visit: Payer: Self-pay

## 2019-07-23 ENCOUNTER — Telehealth: Payer: Self-pay

## 2019-07-23 VITALS — BP 90/50 | HR 58 | Temp 97.7°F | Resp 18 | Wt 180.0 lb

## 2019-07-23 DIAGNOSIS — J441 Chronic obstructive pulmonary disease with (acute) exacerbation: Secondary | ICD-10-CM

## 2019-07-23 NOTE — Progress Notes (Signed)
Patient: James [name] Male    DOB: February 13, 1929   85 y.o.   MRN: [id number] Visit Date: 07/23/2019  Today's Provider: Lelon Huh, MD   Chief Complaint  Patient presents with  . Hospitalization Follow-up   Subjective:     HPI  Follow up Hospitalization  Patient was admitted to [facility] on 07/13/2019 and discharged on 07/14/2019. He was treated for COPD exacerbation and Acute on chronic respiratory failure. Treatment for this included IV steroids, then 5 day Prednisone taper.and inhalers. Patient was discharged home on small prednisone taper and empiric Zithromax for a few days. He reports good compliance with treatment. He reports this condition is Improved.  ------------------------------------------------------------------------------------    Allergies  Allergen Reactions  . Indomethacin Nausea Only     Current Outpatient Medications:  .  albuterol (PROAIR HFA) 108 (90 Base) MCG/ACT inhaler, TAKE 2 PUFFS INTO LUNGS EVERY 6 HOURS ASNEEDED FOR WHEEZING OR SHORTNESS OF BREATH, Disp: 8.5 g, Rfl: 5 .  atorvastatin (LIPITOR) 80 MG tablet, TAKE 1 TABLET BY MOUTH DAILY GENERIC EQUIVALENT FOR LIPITOR, Disp: 90 tablet, Rfl: 4 .  azelastine (OPTIVAR) 0.05 % ophthalmic solution, APPLY ONE DROP TO EYE TWICE DAILY, Disp: 6 mL, Rfl: 5 .  benazepril-hydrochlorthiazide (LOTENSIN HCT) 20-12.5 MG tablet, TAKE ONE TABLET BY MOUTH EVERY DAY, Disp: 90 tablet, Rfl: 4 .  beta carotene w/minerals (OCUVITE) tablet, Take 1 tablet by mouth daily., Disp: , Rfl:  .  ELIQUIS 5 MG TABS tablet, TAKE 1 TABLET BY MOUTH TWICE DAILY, Disp: 180 tablet, Rfl: 4 .  fluticasone (FLONASE) 50 MCG/ACT nasal spray, Place 2 sprays into both nostrils 2 (two) times daily., Disp: 48 g, Rfl: 3 .  lansoprazole (PREVACID) 30 MG capsule, TAKE 1 CAPSULE EVERY DAY, Disp: 90 capsule, Rfl: 3 .  LORazepam (ATIVAN) 1 MG tablet, TAKE ONE TABLET BY MOUTH AT BEDTIME, Disp: 30 tablet, Rfl: 5 .  Magnesium 400 MG CAPS, Take 1  capsule by mouth daily. , Disp: , Rfl:  .  metoprolol tartrate (LOPRESSOR) 50 MG tablet, TAKE ONE TABLET BY MOUTH TWICE DAILY, Disp: 180 tablet, Rfl: 4 .  montelukast (SINGULAIR) 10 MG tablet, Take 1 tablet (10 mg total) by mouth at bedtime., Disp: 90 tablet, Rfl: 3 .  MULTIPLE VITAMIN PO, Take 1 tablet by mouth daily. , Disp: , Rfl:  .  Omega-3 Fatty Acids (FISH OIL) 1000 MG CAPS, Take 1 capsule daily by mouth., Disp: , Rfl:  .  SPIRIVA HANDIHALER 18 MCG inhalation capsule, INHALE CONTENTS OF 1 CAPSULE AS DIRECTEDONCE A DAY VIA HANDIHALER, Disp: 30 capsule, Rfl: 12 .  tamsulosin (FLOMAX) 0.4 MG CAPS capsule, Take 1 capsule (0.4 mg total) by mouth daily., Disp: 90 capsule, Rfl: 3  Review of Systems  Constitutional: Negative for appetite change, chills and fever.  HENT: Positive for congestion (head congestion).   Respiratory: Positive for cough and shortness of breath. Negative for chest tightness and wheezing.   Cardiovascular: Negative for chest pain and palpitations.  Gastrointestinal: Negative for abdominal pain, nausea and vomiting.    Social History   Tobacco Use  . Smoking status: Former Smoker    Packs/day: 1.00    Years: 11.00    Pack years: 11.00    Types: Cigarettes    Quit date: 02/12/1984    Years since quitting: 35.4  . Smokeless tobacco: Never Used  . Tobacco comment: quit smoking 30 yrs ago  Substance Use Topics  . Alcohol use: Yes  Alcohol/week: 0.0 standard drinks    Comment: beer occasionally      Objective:   BP (!) 90/50 (BP Location: Left Arm, Patient Position: Sitting, Cuff Size: Large)   Pulse (!) 58   Temp 97.7 F (36.5 C) (Oral)   Resp 18   Wt 180 lb (81.6 kg)   SpO2 99% Comment: room air  BMI 26.97 kg/m  Vitals:   07/23/19 1005  BP: (!) 90/50  Pulse: (!) 58  Resp: 18  Temp: 97.7 F (36.5 C)  TempSrc: Oral  SpO2: 99%  Weight: 180 lb (81.6 kg)     Physical Exam   General Appearance:    Alert, cooperative, no distress  Eyes:     PERRL, conjunctiva/corneas clear, EOM's intact       Lungs:     Clear to auscultation bilaterally, respirations unlabored  Heart:    Normal heart rate. Regular rhythm. No murmurs, rubs, or gallops.   MS:   All extremities are intact.   Neurologic:   Awake, alert, oriented x 3. No apparent focal neurological           defect.          Assessment & Plan    1. COPD exacerbation ([place]) Doing much better, nearly back to baseline. Continue current medications.  Call if any flare up of sx or if not continuing to steadily improve.  The entirety of the information documented in the History of Present Illness, Review of Systems and Physical Exam were personally obtained by me. Portions of this information were initially documented by Meyer Cory, CMA and reviewed by me for thoroughness and accuracy.       Lelon Huh, MD  [facility]

## 2019-07-23 NOTE — Patient Instructions (Addendum)
.   Please review the attached list of medications and notify my office if there are any errors.   . Please bring all of your medications to every appointment so we can make sure that our medication list is the same as yours.   . We will have flu vaccines available after Labor Day. Please go to your pharmacy or call the office in early September to schedule you flu shot.   Contact Dr. Mathis Fare office as soon as possible to schedule COPD follow up

## 2019-07-23 NOTE — Telephone Encounter (Signed)
I think these are chronic. He's ok to come to office so long as he does not have fever and cough is not getting worse.

## 2019-07-23 NOTE — Telephone Encounter (Signed)
Dr. Caryn Section, this patient has an appointment with you this morning at 10am for hospital follow up. He was hospitalized for COPD exacerbation and acute respiratory failure. I called patient to do prescreening and he answered yes to having a cough, head congestion and shortness of breath. Patient denies any fever. He was tested for COVID while in the hospital and the result was negative. Please advise of this appointment need to be a virtual/ telephone visit, or if he is ok to come into the office.

## 2019-07-23 NOTE — Telephone Encounter (Signed)
Pt advised.   Thanks,   -Kemara Quigley  

## 2019-07-24 DIAGNOSIS — H04123 Dry eye syndrome of bilateral lacrimal glands: Secondary | ICD-10-CM | POA: Diagnosis not present

## 2019-07-26 NOTE — Progress Notes (Signed)
[facility]  [phone]) [phone] [phone]) [phone]  ID: James [name] OB: 02/15/1929  MR#: [id number]  GGE#:[id number]  Patient Care Team: Birdie Sons, MD as PCP - General (Family Medicine) Abbie Sons, MD (Urology) Ubaldo Glassing Javier Docker, MD as Consulting Physician (Cardiology) Telford Nab, RN as Registered Nurse  CHIEF COMPLAINT: Clinical stage IIB adenocarcinoma of the right lower lobe lung.  INTERVAL HISTORY: Patient returns to clinic to for further evaluation and discussion of his imaging results. He continues to feel well, but does complain of cough today. He has no neurologic complaints.  He denies any recent fevers or illnesses.  He has a good appetite and denies weight loss.  He denies any chest pain, shortness of breath, or hemoptysis.  He has no nausea, vomiting, constipation, or diarrhea.  He has no urinary complaints. Patient offers no specific complaints today.  REVIEW OF SYSTEMS:   Review of Systems  Constitutional: Negative.  Negative for fever, malaise/fatigue and weight loss.  Respiratory: Positive for cough. Negative for hemoptysis and shortness of breath.   Cardiovascular: Negative.  Negative for chest pain and leg swelling.  Gastrointestinal: Negative.  Negative for abdominal pain.  Genitourinary: Negative.  Negative for dysuria.  Musculoskeletal: Negative.  Negative for back pain.  Skin: Negative.  Negative for rash.  Neurological: Negative.  Negative for sensory change, focal weakness, weakness and headaches.  Psychiatric/Behavioral: Negative.  The patient is not nervous/anxious.     As per HPI. Otherwise, a complete review of systems is negative.  PAST MEDICAL HISTORY: Past Medical History:  Diagnosis Date   Arthritis    Back pain    CAD (coronary artery disease)    Cataract    right   COPD (chronic obstructive pulmonary disease) ([place])    SPiriva and SYmbicort daily. Albuterol as needed   Diverticulosis    Dyspnea      with exertion   Enlarged prostate    takes Flomax daily   GERD (gastroesophageal reflux disease)    History of chicken pox    History of gout    History of measles    History of mumps    HOH (hard of hearing)    Hyperglycemia    Hyperlipidemia    takes Atorvastatin daily   Hypertension    takes Metoprolol daily as well as Lotensin HCT   Hypotension    Joint pain    Microscopic colitis    PAF (paroxysmal atrial fibrillation) ([place]), RVR 03/14/2017   Pneumonia    Prostate cancer ([place])    Stroke ([place])    TIA   TIA (transient ischemic attack)    Weakness    numbness and tingling.mainly on right    PAST SURGICAL HISTORY: Past Surgical History:  Procedure Laterality Date   ANTERIOR CERVICAL DECOMP/DISCECTOMY FUSION N/A 03/07/2017   Procedure: ANTERIOR CERVICAL DECOMPRESSION/DISCECTOMY FUSION CERVICAL THREE- CERVICAL FOUR, CERVICAL FOUR- CERVICAL FIVE;  Surgeon: Newman Pies, MD;  Location: [facility];  Service: Neurosurgery;  Laterality: N/A;  ANTERIOR CERVICAL DECOMPRESSION/DISCECTOMY FUSION CERVICAL 3- CERVICAL 4, CERVIACL 4- CERVICAL 5   APPENDECTOMY  1950   CARDIAC CATHETERIZATION  05/29/2013   EF=40-45%. Moderate pulmonary hypertension. Infero/ lateral hypokinesis   cataract surgery Left    COLONOSCOPY     CORONARY ARTERY BYPASS GRAFT  2011   x 5   ESOPHAGOGASTRODUODENOSCOPY (EGD) WITH PROPOFOL N/A 07/01/2018   Procedure: ESOPHAGOGASTRODUODENOSCOPY (EGD) WITH PROPOFOL;  Surgeon: Lin Landsman, MD;  Location: [facility];  Service: Gastroenterology;  Laterality: N/A;  EYE SURGERY     FLEXIBLE BRONCHOSCOPY N/A 11/19/2017   Procedure: FLEXIBLE BRONCHOSCOPY;  Surgeon: Laverle Hobby, MD;  Location: [facility];  Service: Pulmonary;  Laterality: N/A;   MRI BRAIN  06/07/2013   Mild chronic involutional changes. No acute abnormalities   MRI of neck     Myocardial Perfusion Scan  05/29/2013   Abnormal myocardial perfusion image  consistent with myocardial infarction   PROSTATE SURGERY  11/18/2012   radiation seed   TOTAL HIP ARTHROPLASTY Left 1997   hip joint replacement    FAMILY HISTORY: Family History  Problem Relation Age of Onset   Heart attack Mother    Stroke Father    Heart attack Father    Hypertension Father    Heart attack Sister    Bladder Cancer Brother    Kidney cancer Brother    Heart Problems Brother     ADVANCED DIRECTIVES (Y/N):  N  HEALTH MAINTENANCE: Social History   Tobacco Use   Smoking status: Former Smoker    Packs/day: 1.00    Years: 11.00    Pack years: 11.00    Types: Cigarettes    Quit date: 02/12/1984    Years since quitting: 35.4   Smokeless tobacco: Never Used   Tobacco comment: quit smoking 30 yrs ago  Substance Use Topics   Alcohol use: Yes    Alcohol/week: 0.0 standard drinks    Comment: beer occasionally   Drug use: No     Colonoscopy:  PAP:  Bone density:  Lipid panel:  Allergies  Allergen Reactions   Indomethacin Nausea Only    Current Outpatient Medications  Medication Sig Dispense Refill   albuterol (PROAIR HFA) 108 (90 Base) MCG/ACT inhaler TAKE 2 PUFFS INTO LUNGS EVERY 6 HOURS ASNEEDED FOR WHEEZING OR SHORTNESS OF BREATH 8.5 g 5   atorvastatin (LIPITOR) 80 MG tablet TAKE 1 TABLET BY MOUTH DAILY GENERIC EQUIVALENT FOR LIPITOR 90 tablet 4   azelastine (OPTIVAR) 0.05 % ophthalmic solution APPLY ONE DROP TO EYE TWICE DAILY 6 mL 5   benazepril-hydrochlorthiazide (LOTENSIN HCT) 20-12.5 MG tablet TAKE ONE TABLET BY MOUTH EVERY DAY 90 tablet 4   beta carotene w/minerals (OCUVITE) tablet Take 1 tablet by mouth daily.     ELIQUIS 5 MG TABS tablet TAKE 1 TABLET BY MOUTH TWICE DAILY 180 tablet 4   famotidine (PEPCID) 20 MG tablet Take 1 tablet by mouth 1 day or 1 dose.     fluticasone (FLONASE) 50 MCG/ACT nasal spray Place 2 sprays into both nostrils 2 (two) times daily. 48 g 3   lansoprazole (PREVACID) 30 MG capsule TAKE 1  CAPSULE EVERY DAY 90 capsule 3   LORazepam (ATIVAN) 1 MG tablet TAKE ONE TABLET BY MOUTH AT BEDTIME 30 tablet 5   Magnesium 400 MG CAPS Take 1 capsule by mouth daily.      metoprolol tartrate (LOPRESSOR) 50 MG tablet TAKE ONE TABLET BY MOUTH TWICE DAILY 180 tablet 4   montelukast (SINGULAIR) 10 MG tablet Take 1 tablet (10 mg total) by mouth at bedtime. 90 tablet 3   MULTIPLE VITAMIN PO Take 1 tablet by mouth daily.      Omega-3 Fatty Acids (FISH OIL) 1000 MG CAPS Take 1 capsule daily by mouth.     SPIRIVA HANDIHALER 18 MCG inhalation capsule INHALE CONTENTS OF 1 CAPSULE AS DIRECTEDONCE A DAY VIA HANDIHALER 30 capsule 12   SYMBICORT 160-4.5 MCG/ACT inhaler Inhale 1 puff into the lungs.     tamsulosin (FLOMAX) 0.4 MG  CAPS capsule Take 1 capsule (0.4 mg total) by mouth daily. 90 capsule 3   No current facility-administered medications for this visit.     OBJECTIVE: Vitals:   07/29/19 1038  BP: 123/63  Pulse: 68  Temp: 98.5 F (36.9 C)     Body mass index is 26.63 kg/m.    ECOG FS:0 - Asymptomatic  General: Well-developed, well-nourished, no acute distress. Eyes: Pink conjunctiva, anicteric sclera. HEENT: Normocephalic, moist mucous membranes. Lungs: Clear to auscultation bilaterally. Heart: Regular rate and rhythm. No rubs, murmurs, or gallops. Abdomen: Soft, nontender, nondistended. No organomegaly noted, normoactive bowel sounds. Musculoskeletal: No edema, cyanosis, or clubbing. Neuro: Alert, answering all questions appropriately. Cranial nerves grossly intact. Skin: No rashes or petechiae noted. Psych: Normal affect.  LAB RESULTS:  Lab Results  Component Value Date   NA 133 (L) 07/13/2019   K 4.4 07/13/2019   CL 99 07/13/2019   CO2 27 07/13/2019   GLUCOSE 119 (H) 07/13/2019   BUN 14 07/13/2019   CREATININE 0.92 07/13/2019   CALCIUM 8.1 (L) 07/13/2019   PROT 6.6 07/13/2019   ALBUMIN 4.0 07/13/2019   AST 28 07/13/2019   ALT 25 07/13/2019   ALKPHOS 105  07/13/2019   BILITOT 0.8 07/13/2019   GFRNONAA >60 07/13/2019   GFRAA >60 07/13/2019    Lab Results  Component Value Date   WBC 5.0 07/13/2019   NEUTROABS 3.0 07/13/2019   HGB 12.4 (L) 07/13/2019   HCT 37.4 (L) 07/13/2019   MCV 88.8 07/13/2019   PLT 158 07/13/2019     STUDIES: Ct Chest W Contrast  Result Date: 07/15/2019 CLINICAL DATA:  Right lower lobe adenocarcinoma.  Status post XRT. EXAM: CT CHEST WITH CONTRAST TECHNIQUE: Multidetector CT imaging of the chest was performed during intravenous contrast administration. CONTRAST:  70m OMNIPAQUE IOHEXOL 300 MG/ML  SOLN COMPARISON:  12/31/2018 FINDINGS: Cardiovascular: The heart size is normal. No substantial pericardial effusion. Coronary artery calcification is evident. Atherosclerotic calcification is noted in the wall of the thoracic aorta. Mediastinum/Nodes: No mediastinal lymphadenopathy. There is no hilar lymphadenopathy. The esophagus has normal imaging features. There is no axillary lymphadenopathy. Lungs/Pleura: Centrilobular emphsyema noted. Asymmetric elevation right hemidiaphragm. Tiny calcified granuloma right upper lobe is stable chronic atelectasis and/or scarring in the posterior right base is unchanged. No new suspicious pulmonary nodule or mass. Tiny right pleural effusion. Upper Abdomen: Unremarkable. Musculoskeletal: No worrisome lytic or sclerotic osseous abnormality. Compression fractures at T4 and T10 are stable. IMPRESSION: 1. The persistent area of chronic atelectasis and/or scarring posterior right lung base is stable, compatible with sequelae of radiation therapy. 2. Stable tiny right pleural effusion. 3. The area of peripheral ground-glass opacity in the left upper lobe on the previous study has resolved completely in the interval. 4.  Aortic Atherosclerois (ICD10-170.0) 5.  Emphysema. (([id number]) Electronically Signed   By: EMisty StanleyM.D.   On: 07/15/2019 11:13   Dg Chest Portable 1 View  Result Date:  07/13/2019 CLINICAL DATA:  Shortness of breath. EXAM: PORTABLE CHEST 1 VIEW COMPARISON:  12/02/2018 FINDINGS: The lung volumes are low. There are bibasilar airspace opacities. There may be trace bilateral pleural effusions. The heart size is enlarged. The patient is status post prior median sternotomy. Aortic calcifications are noted. There is mild volume overload. There are advanced degenerative changes of the glenohumeral joints, left worse than right. IMPRESSION: 1. Cardiomegaly with mild volume overload. 2. Low lung volumes with bibasilar airspace opacities favored to represent atelectasis with infiltrate not excluded. Electronically Signed  By: Constance Holster M.D.   On: 07/13/2019 02:30    ASSESSMENT: Clinical stage IIB adenocarcinoma of the right lower lobe lung.  PLAN:    1. Clinical stage IIB adenocarcinoma of the right lower lobe lung: Previously it was determined that patient was not a surgical candidate and given his advanced age chemotherapy is not recommended.  He completed XRT in April 2019. CT results from July 15, 2019 reviewed independently and reported as above with no evidence of recurrent or progressive disease. No intervention is needed at this time.  Return to clinic in 6 months with repeat imaging and further evaluation. If patient's CT scan continues to show no evidence of disease, he can likely can be transitioned to yearly imaging and evaluation.  I spent a total of 20 minutes face-to-face with the patient of which greater than 50% of the visit was spent in counseling and coordination of care as detailed above.   Patient expressed understanding and was in agreement with this plan. He also understands that He can call clinic at any time with any questions, concerns, or complaints.   Cancer Staging Primary adenocarcinoma of lower lobe of right lung [facility]) Staging form: Lung, AJCC 8th Edition - Clinical stage from 02/16/2018: Stage IIB (cT3, cN0, cM0) - Signed by Lloyd Huger, MD on 02/16/2018   Lloyd Huger, MD   07/30/2019 7:04 AM

## 2019-07-29 ENCOUNTER — Other Ambulatory Visit: Payer: Self-pay

## 2019-07-29 ENCOUNTER — Encounter: Payer: Self-pay | Admitting: Oncology

## 2019-07-29 ENCOUNTER — Inpatient Hospital Stay: Payer: Medicare Other | Attending: Oncology | Admitting: Oncology

## 2019-07-29 VITALS — BP 123/63 | HR 68 | Temp 98.5°F | Ht 68.5 in | Wt 177.7 lb

## 2019-07-29 DIAGNOSIS — Z85118 Personal history of other malignant neoplasm of bronchus and lung: Secondary | ICD-10-CM | POA: Insufficient documentation

## 2019-07-29 DIAGNOSIS — C3431 Malignant neoplasm of lower lobe, right bronchus or lung: Secondary | ICD-10-CM | POA: Diagnosis not present

## 2019-07-29 NOTE — Progress Notes (Signed)
Patient stated that he had been doing well. Patient stated that he developed a cough and it has not gotten any better.. Patient went to the ED two weeks ago and was given antibiotic.

## 2019-07-30 ENCOUNTER — Other Ambulatory Visit: Payer: Self-pay | Admitting: Family Medicine

## 2019-07-30 DIAGNOSIS — C3431 Malignant neoplasm of lower lobe, right bronchus or lung: Secondary | ICD-10-CM

## 2019-07-31 NOTE — Telephone Encounter (Signed)
refilled 

## 2019-08-01 ENCOUNTER — Telehealth: Payer: Self-pay

## 2019-08-01 DIAGNOSIS — C3431 Malignant neoplasm of lower lobe, right bronchus or lung: Secondary | ICD-10-CM

## 2019-08-01 DIAGNOSIS — J9621 Acute and chronic respiratory failure with hypoxia: Secondary | ICD-10-CM

## 2019-08-01 MED ORDER — AZITHROMYCIN 250 MG PO TABS: ORAL_TABLET | ORAL | 0 refills | Status: DC

## 2019-08-01 MED ORDER — PREDNISONE 10 MG PO TABS: ORAL_TABLET | ORAL | 0 refills | Status: DC

## 2019-08-01 NOTE — Telephone Encounter (Signed)
Patient was advised.  

## 2019-08-01 NOTE — Telephone Encounter (Signed)
Patient's wife would like to get an antibiotic and Prednisone called in.  She states her husband was seen by Dr Caryn Section and by Dr Kennith Gain recently.  She states he isn't really any better and she is afraid they will end up in the ER over the weekend.  They are trying to avoid that trip.  Please call patient back at [phone] to let them know if and when something can be called in.  Thanks

## 2019-08-01 NOTE — Telephone Encounter (Signed)
[facility] in [facility]

## 2019-08-05 ENCOUNTER — Telehealth: Payer: Self-pay | Admitting: Internal Medicine

## 2019-08-05 NOTE — Telephone Encounter (Signed)
Called patient for COVID-19 pre-screening for in office visit.  Have you recently traveled any where out of the local area in the last 2 weeks? no  Have you been in close contact with a person diagnosed with COVID-19 or someone awaiting results within the last 2 weeks? no  Do you currently have any of the following symptoms?no   Cough     Diarrhea   Joint Pain Fever      Muscle Pain   Red eyes Shortness of breath   Abdominal pain  Vomiting Loss of smell    Rash    Sore Throat Headache    Weakness   Bruising or bleeding   Okay to proceed with visit. 08/07/2019

## 2019-08-06 NOTE — Progress Notes (Signed)
[facility]     Assessment and Plan:   Non-small cell lung cancer. --Stage IIIb lung cancer.  Status post radiation. - Currently doing well, continue to follow-up with oncology.  COPD with recent exacerbation. --Currently on Spiriva, Symbicort, will change to trilogy inhaler, prescription given today. --s/p Prevnar-13 vaccine 11/25/2014.  Status post PPS V-23 on 09/17/2000. - Patient noted breathing was worse with Trelegy inhaler, improved with Spiriva. - We will start nebulized medications.   Dyspnea on exertion with deconditioning/debility.  Right diaphragmatic paralysis. --Previously referred to pulmonary rehab. - Due to right diaphragmatic paralysis he may not be able to get a full inhalation of his inhalers.  We will therefore start on nebulized Pulmicort and Brovana both twice daily.  Elevated/paralyzed right diaphragm. --Present for many years, likely contributing to progressive dyspnea.   Chronic systolic cardiac failure. --Stable EF of 40%.    Orders Placed This Encounter  Procedures  . AMB REFERRAL FOR DME  . Pulse oximetry, overnight   Meds ordered this encounter  Medications  . arformoterol (BROVANA) 15 MCG/2ML NEBU    Sig: Take 2 mLs (15 mcg total) by nebulization 2 (two) times daily.    Dispense:  120 mL    Refill:  3  . budesonide (PULMICORT) 0.5 MG/2ML nebulizer solution    Sig: Take 2 mLs (0.5 mg total) by nebulization 2 (two) times daily.    Dispense:  75 mL    Refill:  12     Return in about 6 months (around 02/07/2020).   Date: 08/06/2019  MRN# [id number] TRELYN VANDERLINDE 04/12/1929   JOLAN UPCHURCH is a 85 y.o. old male seen in consultation for chief complaint of:    No chief complaint on file.   HPI:  DOSSIE SWOR is a 85 y.o. male withCOPD, systolic congestive heart failure, elevated right diaphragm, progressive dyspnea.  He was noted to have a right lower lobe mass diagnosed in December of 2018 as non-small cell lung cancer,  stage IIIb.  Subsequently underwent radiation therapy which was completed in April 2019.  He was deemed not a candidate for chemotherapy due to his advanced age. At last visit he was changed from Symbicort/Spiriva to Trelegy inhaler.  He was noted to have some progressive dyspnea thought to be related to progressive deconditioning and he was referred to pulmonary rehab.  Since his last visit he was admitted to the hospital on 07/13/2019 for 1 day due to use COPD exacerbation, treated with steroids, BiPAP.  He used Trelegy until the beginning of this year, then went back to spiriva and felt better, but did not restart symbicort. He notes that his breathing continues to decline. He does not have a nebulizer at home.    He has cats and dogs at home, seldom in the bedroom.  Review of Dr. Bethanne Ginger records: He is noted to have controlled Afib, most recent echocardiogram showed stable systolic function of about 40%.   **CT chest 07/02/2018>> images personally reviewed, in comparison with previous images from 01/07/2018, there remains right lower lobe atelectasis with elevated right diaphragm.  The previously seen right lower lobe nodule/mass is significantly improved appears to have resolved and discard.  No significant new findings are noted. CT chest on 03/13/17>> this showed chronic interstitial changes, a 3 cm mass-like opacity on posterior right costophrenic sulcus. He was recently sent for a repeat CT on 09/17/17. On my personal review, there is RLL atelectasis with elevated right diaphragm. There is mass in  the RLL posteriorly, this appears to be slightly larger than previous scan in March 2018. **CXR  05/22/16; elevated right diaphragm, stable for several years.  **Spirometry; 09/28/16; restriction with FEV1=48% **Spirometry 05/25/16; FEV1 equals 52%.   Current Meds  Medication Sig  . albuterol (PROAIR HFA) 108 (90 Base) MCG/ACT inhaler TAKE 2 PUFFS INTO LUNGS EVERY 6 HOURS ASNEEDED FOR WHEEZING OR  SHORTNESS OF BREATH  . atorvastatin (LIPITOR) 80 MG tablet TAKE 1 TABLET BY MOUTH DAILY GENERIC EQUIVALENT FOR LIPITOR  . azelastine (OPTIVAR) 0.05 % ophthalmic solution APPLY ONE DROP TO EYE TWICE DAILY  . benazepril-hydrochlorthiazide (LOTENSIN HCT) 20-12.5 MG tablet TAKE ONE TABLET BY MOUTH EVERY DAY  . beta carotene w/minerals (OCUVITE) tablet Take 1 tablet by mouth daily.  . chlorpheniramine-HYDROcodone (TUSSIONEX) 10-8 MG/5ML SUER TAKE 1 TEASPOONFUL EVERY 12 HOURS AS NEEDED FOR COUGH  . ELIQUIS 5 MG TABS tablet TAKE 1 TABLET BY MOUTH TWICE DAILY  . famotidine (PEPCID) 20 MG tablet Take 1 tablet by mouth 1 day or 1 dose.  . fluticasone (FLONASE) 50 MCG/ACT nasal spray Place 2 sprays into both nostrils 2 (two) times daily.  . lansoprazole (PREVACID) 30 MG capsule TAKE 1 CAPSULE EVERY DAY  . LORazepam (ATIVAN) 1 MG tablet TAKE ONE TABLET BY MOUTH AT BEDTIME  . Magnesium 400 MG CAPS Take 1 capsule by mouth daily.   . metoprolol tartrate (LOPRESSOR) 50 MG tablet TAKE ONE TABLET BY MOUTH TWICE DAILY  . montelukast (SINGULAIR) 10 MG tablet Take 1 tablet (10 mg total) by mouth at bedtime.  . MULTIPLE VITAMIN PO Take 1 tablet by mouth daily.   . Omega-3 Fatty Acids (FISH OIL) 1000 MG CAPS Take 1 capsule daily by mouth.  . predniSONE (DELTASONE) 10 MG tablet Take 6 tabs PO on day 1&2, 5 tabs PO on day 3&4, 4 tabs PO on day 5&6, 3 tabs PO on day 7&8, 2 tabs PO on day 9&10, 1 tab PO on day 11&12.  [name] SPIRIVA HANDIHALER 18 MCG inhalation capsule INHALE CONTENTS OF 1 CAPSULE AS DIRECTEDONCE A DAY VIA HANDIHALER  . SYMBICORT 160-4.5 MCG/ACT inhaler Inhale 1 puff into the lungs.  . tamsulosin (FLOMAX) 0.4 MG CAPS capsule Take 1 capsule (0.4 mg total) by mouth daily.  . [DISCONTINUED] azithromycin (ZITHROMAX) 250 MG tablet Take 2 tablets PO on day one, and one tablet PO daily thereafter until completed.     Allergies:  Indomethacin  Review of Systems:  Constitutional: Feels well. Cardiovascular:  Denies chest pain, exertional chest pain.  Pulmonary: Denies hemoptysis, pleuritic chest pain.   The remainder of systems were reviewed and were found to be negative other than what is documented in the HPI.    Physical Examination:   VS: BP 124/70 (BP Location: Left Arm, Cuff Size: Normal)   Pulse 72   Temp (!) 97.5 F (36.4 C) (Temporal)   Ht 5\' 8"  (1.727 m)   Wt 174 lb (78.9 kg)   SpO2 96%   BMI 26.46 kg/m   General Appearance: No distress  Neuro:without focal findings, mental status, speech normal, alert and oriented HEENT: PERRLA, EOM intact Pulmonary: No wheezing, No rales  CardiovascularNormal S1,S2.  No m/r/g.  Abdomen: Benign, Soft, non-tender, No masses Renal:  No costovertebral tenderness  GU:  No performed at this time. Endoc: No evident thyromegaly, no signs of acromegaly or Cushing features Skin:   warm, no rashes, no ecchymosis  Extremities: normal, no cyanosis, clubbing.    LABORATORY PANEL:   CBC No  results for input(s): WBC, HGB, HCT, PLT in the last 168 hours. ------------------------------------------------------------------------------------------------------------------  Chemistries  No results for input(s): NA, K, CL, CO2, GLUCOSE, BUN, CREATININE, CALCIUM, MG, AST, ALT, ALKPHOS, BILITOT in the last 168 hours.  Invalid input(s): GFRCGP ------------------------------------------------------------------------------------------------------------------  Cardiac Enzymes No results for input(s): TROPONINI in the last 168 hours. ------------------------------------------------------------  RADIOLOGY:  No results found.     Thank  you for the consultation and for allowing [facility] to assist in the care of your patient. Our recommendations are noted above.  Please contact us if we can be of further service.  Marda Stalker, M.D., F.C.C.P.  Board Certified in Internal Medicine, Pulmonary Medicine, [place], and Sleep Medicine.  [facility] Office Number: [phone]   08/06/2019

## 2019-08-07 ENCOUNTER — Encounter: Payer: Self-pay | Admitting: Internal Medicine

## 2019-08-07 ENCOUNTER — Ambulatory Visit: Payer: Medicare Other | Admitting: Internal Medicine

## 2019-08-07 ENCOUNTER — Other Ambulatory Visit: Payer: Self-pay

## 2019-08-07 VITALS — BP 124/70 | HR 72 | Temp 97.5°F | Ht 68.0 in | Wt 174.0 lb

## 2019-08-07 DIAGNOSIS — J449 Chronic obstructive pulmonary disease, unspecified: Secondary | ICD-10-CM | POA: Diagnosis not present

## 2019-08-07 MED ORDER — BROVANA 15 MCG/2ML IN NEBU: 15.0000 ug | INHALATION_SOLUTION | Freq: Two times a day (BID) | RESPIRATORY_TRACT | 3 refills | Status: DC

## 2019-08-07 MED ORDER — BUDESONIDE 0.5 MG/2ML IN SUSP: 0.5000 mg | Freq: Two times a day (BID) | RESPIRATORY_TRACT | 12 refills | Status: DC

## 2019-08-07 NOTE — Patient Instructions (Signed)
Continue Spiriva daily. We will change from Symbicort to nebulized medications to be used twice daily.

## 2019-08-14 ENCOUNTER — Encounter: Payer: Self-pay | Admitting: Internal Medicine

## 2019-08-18 ENCOUNTER — Telehealth: Payer: Self-pay | Admitting: Internal Medicine

## 2019-08-18 DIAGNOSIS — J449 Chronic obstructive pulmonary disease, unspecified: Secondary | ICD-10-CM

## 2019-08-18 NOTE — Telephone Encounter (Signed)
ONO reviewed by DR- recommend 1L QHS. Pt is aware of results and voiced his understanding.  Order has been placed, as pt wished to proceed.  Nothing further is needed at this time.

## 2019-08-19 DIAGNOSIS — J449 Chronic obstructive pulmonary disease, unspecified: Secondary | ICD-10-CM | POA: Diagnosis not present

## 2019-08-26 DIAGNOSIS — J449 Chronic obstructive pulmonary disease, unspecified: Secondary | ICD-10-CM | POA: Diagnosis not present

## 2019-08-27 ENCOUNTER — Ambulatory Visit (INDEPENDENT_AMBULATORY_CARE_PROVIDER_SITE_OTHER): Payer: Medicare Other

## 2019-08-27 ENCOUNTER — Other Ambulatory Visit: Payer: Self-pay

## 2019-08-27 ENCOUNTER — Telehealth: Payer: Self-pay | Admitting: Internal Medicine

## 2019-08-27 DIAGNOSIS — Z23 Encounter for immunization: Secondary | ICD-10-CM | POA: Diagnosis not present

## 2019-08-27 DIAGNOSIS — J449 Chronic obstructive pulmonary disease, unspecified: Secondary | ICD-10-CM | POA: Diagnosis not present

## 2019-08-27 MED ORDER — BUDESONIDE 0.5 MG/2ML IN SUSP: 0.5000 mg | Freq: Two times a day (BID) | RESPIRATORY_TRACT | 12 refills | Status: AC

## 2019-08-27 MED ORDER — BROVANA 15 MCG/2ML IN NEBU: 15.0000 ug | INHALATION_SOLUTION | Freq: Two times a day (BID) | RESPIRATORY_TRACT | 3 refills | Status: DC

## 2019-08-27 NOTE — Telephone Encounter (Signed)
Rx fip Pulmicort and brovana [facility] per pt request.  Nothing further is needed.

## 2019-08-28 ENCOUNTER — Other Ambulatory Visit: Payer: Self-pay | Admitting: Family Medicine

## 2019-09-18 DIAGNOSIS — J449 Chronic obstructive pulmonary disease, unspecified: Secondary | ICD-10-CM | POA: Diagnosis not present

## 2019-10-02 DIAGNOSIS — I10 Essential (primary) hypertension: Secondary | ICD-10-CM | POA: Diagnosis not present

## 2019-10-02 DIAGNOSIS — I2510 Atherosclerotic heart disease of native coronary artery without angina pectoris: Secondary | ICD-10-CM | POA: Diagnosis not present

## 2019-10-02 DIAGNOSIS — E7800 Pure hypercholesterolemia, unspecified: Secondary | ICD-10-CM | POA: Diagnosis not present

## 2019-10-02 DIAGNOSIS — I480 Paroxysmal atrial fibrillation: Secondary | ICD-10-CM | POA: Diagnosis not present

## 2019-10-07 DIAGNOSIS — J449 Chronic obstructive pulmonary disease, unspecified: Secondary | ICD-10-CM | POA: Diagnosis not present

## 2019-10-09 DIAGNOSIS — R0683 Snoring: Secondary | ICD-10-CM | POA: Diagnosis not present

## 2019-10-14 DIAGNOSIS — J449 Chronic obstructive pulmonary disease, unspecified: Secondary | ICD-10-CM | POA: Diagnosis not present

## 2019-10-18 ENCOUNTER — Other Ambulatory Visit: Payer: Self-pay | Admitting: Physician Assistant

## 2019-10-18 DIAGNOSIS — C3431 Malignant neoplasm of lower lobe, right bronchus or lung: Secondary | ICD-10-CM

## 2019-10-19 DIAGNOSIS — J449 Chronic obstructive pulmonary disease, unspecified: Secondary | ICD-10-CM | POA: Diagnosis not present

## 2019-10-21 NOTE — Telephone Encounter (Signed)
Last OV 07/23/2019 and last refill 07/31/2019

## 2019-10-27 ENCOUNTER — Ambulatory Visit (INDEPENDENT_AMBULATORY_CARE_PROVIDER_SITE_OTHER): Payer: Medicare Other | Admitting: Family Medicine

## 2019-10-27 ENCOUNTER — Encounter: Payer: Self-pay | Admitting: Family Medicine

## 2019-10-27 DIAGNOSIS — J441 Chronic obstructive pulmonary disease with (acute) exacerbation: Secondary | ICD-10-CM | POA: Diagnosis not present

## 2019-10-27 DIAGNOSIS — R05 Cough: Secondary | ICD-10-CM | POA: Diagnosis not present

## 2019-10-27 DIAGNOSIS — R059 Cough, unspecified: Secondary | ICD-10-CM

## 2019-10-27 DIAGNOSIS — G4710 Hypersomnia, unspecified: Secondary | ICD-10-CM | POA: Diagnosis not present

## 2019-10-27 DIAGNOSIS — R0683 Snoring: Secondary | ICD-10-CM | POA: Diagnosis not present

## 2019-10-27 MED ORDER — PREDNISONE 20 MG PO TABS: 20.0000 mg | ORAL_TABLET | Freq: Two times a day (BID) | ORAL | 0 refills | Status: DC

## 2019-10-27 MED ORDER — DOXYCYCLINE HYCLATE 100 MG PO TABS: 100.0000 mg | ORAL_TABLET | Freq: Two times a day (BID) | ORAL | 0 refills | Status: DC

## 2019-10-27 NOTE — Progress Notes (Signed)
Patient: James [name] Male    DOB: 06/01/1929   85 y.o.   MRN: [id number] Visit Date: 10/27/2019  Today's Provider: Lelon Huh, MD   Chief Complaint  Patient presents with  . Cough   Subjective:    Virtual Visit via Telephone Note  I connected with James [name] on 10/27/19 at 10:40 AM EST by telephone and verified that I am speaking with the correct person using two identifiers.  Location: Patient: HOME Provider: [facility]   I discussed the limitations, risks, security and privacy concerns of performing an evaluation and management service by telephone and the availability of in person appointments. I also discussed with the patient that there may be a patient responsible charge related to this service. The patient expressed understanding and agreed to proceed.        Cough This is a new problem. Episode onset: 1 month ago. The problem has been unchanged. The cough is productive of sputum (pale yellow colored). Associated symptoms include myalgias and shortness of breath. Pertinent negatives include no chest pain, chills, ear congestion, ear pain, fever, headaches, hemoptysis, nasal congestion, postnasal drip, rhinorrhea, sore throat, sweats or wheezing. Treatments tried: Nebulizer treatments. The treatment provided mild relief.    Allergies  Allergen Reactions  . Indomethacin Nausea Only     Current Outpatient Medications:  .  albuterol (PROAIR HFA) 108 (90 Base) MCG/ACT inhaler, TAKE 2 PUFFS INTO LUNGS EVERY 6 HOURS ASNEEDED FOR WHEEZING OR SHORTNESS OF BREATH, Disp: 8.5 g, Rfl: 5 .  arformoterol (BROVANA) 15 MCG/2ML NEBU, Take 2 mLs (15 mcg total) by nebulization 2 (two) times daily. DX:J44.9, Disp: 120 mL, Rfl: 3 .  atorvastatin (LIPITOR) 80 MG tablet, TAKE 1 TABLET BY MOUTH DAILY GENERIC EQUIVALENT FOR LIPITOR, Disp: 90 tablet, Rfl: 4 .  azelastine (OPTIVAR) 0.05 % ophthalmic solution, APPLY ONE DROP TO EYE TWICE DAILY, Disp: 6 mL, Rfl: 5 .   benazepril-hydrochlorthiazide (LOTENSIN HCT) 20-12.5 MG tablet, TAKE ONE TABLET BY MOUTH EVERY DAY, Disp: 90 tablet, Rfl: 4 .  beta carotene w/minerals (OCUVITE) tablet, Take 1 tablet by mouth daily., Disp: , Rfl:  .  budesonide (PULMICORT) 0.5 MG/2ML nebulizer solution, Take 2 mLs (0.5 mg total) by nebulization 2 (two) times daily. Dx:j44.9, Disp: 75 mL, Rfl: 12 .  chlorpheniramine-HYDROcodone (TUSSIONEX) 10-8 MG/5ML SUER, TAKE 1 TEASPOONFUL EVERY 12 HOURS AS NEEDED FOR COUGH, Disp: 115 mL, Rfl: 0 .  ELIQUIS 5 MG TABS tablet, TAKE 1 TABLET BY MOUTH TWICE DAILY, Disp: 180 tablet, Rfl: 4 .  famotidine (PEPCID) 20 MG tablet, Take 1 tablet by mouth 1 day or 1 dose., Disp: , Rfl:  .  fluticasone (FLONASE) 50 MCG/ACT nasal spray, Place 2 sprays into both nostrils 2 (two) times daily., Disp: 48 g, Rfl: 3 .  lansoprazole (PREVACID) 30 MG capsule, TAKE 1 CAPSULE EVERY DAY, Disp: 90 capsule, Rfl: 3 .  LORazepam (ATIVAN) 1 MG tablet, TAKE ONE TABLET BY MOUTH AT BEDTIME, Disp: 30 tablet, Rfl: 5 .  Magnesium 400 MG CAPS, Take 1 capsule by mouth daily. , Disp: , Rfl:  .  metoprolol tartrate (LOPRESSOR) 50 MG tablet, TAKE ONE TABLET BY MOUTH TWICE DAILY, Disp: 180 tablet, Rfl: 4 .  montelukast (SINGULAIR) 10 MG tablet, Take 1 tablet (10 mg total) by mouth at bedtime., Disp: 90 tablet, Rfl: 3 .  MULTIPLE VITAMIN PO, Take 1 tablet by mouth daily. , Disp: , Rfl:  .  Omega-3 Fatty Acids (FISH OIL) 1000 MG  CAPS, Take 1 capsule daily by mouth., Disp: , Rfl:  .  tamsulosin (FLOMAX) 0.4 MG CAPS capsule, Take 1 capsule (0.4 mg total) by mouth daily., Disp: 90 capsule, Rfl: 3 .  SPIRIVA HANDIHALER 18 MCG inhalation capsule, INHALE CONTENTS OF 1 CAPSULE AS DIRECTEDONCE A DAY VIA HANDIHALER (Patient not taking: Reported on 10/27/2019), Disp: 30 capsule, Rfl: 12 .  SYMBICORT 160-4.5 MCG/ACT inhaler, 2 PUFFS INTO LUNGS TWICE DAILY (Patient not taking: Reported on 10/27/2019), Disp: 10.2 g, Rfl: 3  Review of Systems   Constitutional: Negative for chills, diaphoresis, fatigue and fever.  HENT: Negative for ear pain, postnasal drip, rhinorrhea and sore throat.   Respiratory: Positive for cough and shortness of breath. Negative for hemoptysis and wheezing.   Cardiovascular: Negative for chest pain.  Musculoskeletal: Positive for myalgias.  Neurological: Negative for headaches.    Social History   Tobacco Use  . Smoking status: Former Smoker    Packs/day: 1.00    Years: 11.00    Pack years: 11.00    Types: Cigarettes    Quit date: 02/12/1984    Years since quitting: 35.7  . Smokeless tobacco: Never Used  . Tobacco comment: quit smoking 30 yrs ago  Substance Use Topics  . Alcohol use: Yes    Alcohol/week: 0.0 standard drinks    Comment: beer occasionally      Objective:   Temp (!) 95.8 F (35.4 C) (Temporal)  Vitals:   10/27/19 0835  Temp: (!) 95.8 F (35.4 C)  TempSrc: Temporal  There is no height or weight on file to calculate BMI.   Physical Exam   Awake, alert oriented x 3. No indications of distress. Occasionally coughing.      Assessment & Plan    1. Cough  - doxycycline (VIBRA-TABS) 100 MG tablet; Take 1 tablet (100 mg total) by mouth 2 (two) times daily for 10 days.  Dispense: 20 tablet; Refill: 0  2. COPD with acute exacerbation (HCC)  - predniSONE (DELTASONE) 20 MG tablet; Take 1 tablet (20 mg total) by mouth 2 (two) times daily with a meal for 7 days.  Dispense: 14 tablet; Refill: 0    I discussed the assessment and treatment plan with the patient. The patient was provided an opportunity to ask questions and all were answered. The patient agreed with the plan and demonstrated an understanding of the instructions.   The patient was advised to call back or seek an in-person evaluation if the symptoms worsen or if the condition fails to improve as anticipated.  I provided 11 minutes of non-face-to-face time during this encounter.       Lelon Huh, MD   [facility]

## 2019-10-30 DIAGNOSIS — G4733 Obstructive sleep apnea (adult) (pediatric): Secondary | ICD-10-CM | POA: Diagnosis not present

## 2019-11-04 ENCOUNTER — Other Ambulatory Visit: Payer: Self-pay | Admitting: Family Medicine

## 2019-11-04 DIAGNOSIS — C3431 Malignant neoplasm of lower lobe, right bronchus or lung: Secondary | ICD-10-CM

## 2019-11-04 NOTE — Telephone Encounter (Signed)
Requested medication (s) are due for refill today: yes  Requested medication (s) are on the active medication list: yes  Last refill:10/21/2019  Future visit scheduled: No  Notes to clinic: Medication off protocol.  Patient reported not taking 10/27/2019    Requested Prescriptions  Pending Prescriptions Disp Refills   chlorpheniramine-HYDROcodone ([place]) 10-8 MG/5ML [place] [Pharmacy Med Name: HYDROCOD POLST-CPM POLST ER 10-8 MG] 115 mL     Sig: TAKE 1 TEASPOONFUL EVERY 12 HOURS AS NEEDED FOR COUGH     Off-Protocol Failed - 11/04/2019  3:06 PM      Failed - Medication not assigned to a protocol, review manually.      Passed - Valid encounter within last 12 months    Recent Outpatient Visits          1 week ago    [facility] Birdie Sons, MD   3 months ago [facility] exacerb[facility])   [facility] Birdie Sons, MD   10 months ago Chest wall pain   [facility] Birdie Sons, MD   12 months ago Bursitis of other bursa of left hip   [facility] Birdie Sons, MD   1 year ago COPD exacerbation [facility])   [facility] Birdie Sons, MD

## 2019-11-07 DIAGNOSIS — J449 Chronic obstructive pulmonary disease, unspecified: Secondary | ICD-10-CM | POA: Diagnosis not present

## 2019-11-17 ENCOUNTER — Other Ambulatory Visit: Payer: Self-pay | Admitting: Family Medicine

## 2019-11-17 DIAGNOSIS — J441 Chronic obstructive pulmonary disease with (acute) exacerbation: Secondary | ICD-10-CM

## 2019-11-17 DIAGNOSIS — R05 Cough: Secondary | ICD-10-CM

## 2019-11-17 DIAGNOSIS — R059 Cough, unspecified: Secondary | ICD-10-CM

## 2019-11-17 NOTE — Telephone Encounter (Signed)
Requested medication (s) are due for refill today: no  Requested medication (s) are on the active medication list: yes  Last refill:  10/27/2019  Future visit scheduled: no  Notes to clinic:  Refill cannot be delegated    Requested Prescriptions  Pending Prescriptions Disp Refills   predniSONE (DELTASONE) 20 MG tablet [Pharmacy Med Name: PREDNISONE 20 MG TAB] 14 tablet 0    Sig: TAKE ONE TABLET TWICE DAILY WITH A MEAL FOR 7 DAYS     Not Delegated - Endocrinology:  Oral Corticosteroids Failed - 11/17/2019  9:11 AM      Failed - This refill cannot be delegated      Passed - Last BP in normal range    BP Readings from Last 1 Encounters:  08/07/19 124/70         Passed - Valid encounter within last 6 months    Recent Outpatient Visits          3 weeks ago Cough   [facility] Birdie Sons, MD   3 months ago COPD exacerbation [facility])   [facility] Birdie Sons, MD   11 months ago Chest wall pain   [facility] Birdie Sons, MD   1 year ago Bursitis of other bursa of left hip   [facility] Birdie Sons, MD   1 year ago COPD exacerbation [facility])   [facility] Birdie Sons, MD              doxycycline (VIBRA-TABS) 100 MG tablet [Pharmacy Med Name: DOXYCYCLINE HYCLATE 100 MG TAB] 20 tablet 0    Sig: TAKE ONE TABLET TWICE DAILY FOR 10 DAYS     Off-Protocol Failed - 11/17/2019  9:11 AM      Failed - Medication not assigned to a protocol, review manually.      Passed - Valid encounter within last 12 months    Recent Outpatient Visits          3 weeks ago Cough   [facility] Birdie Sons, MD   3 months ago COPD exacerbation [facility])   [facility] Birdie Sons, MD   11 months ago Chest wall pain   [facility] Birdie Sons, MD   1 year ago Bursitis of other bursa of left hip   [facility] Birdie Sons, MD   1  year ago COPD exacerbation [facility])   [facility] Birdie Sons, MD

## 2019-11-18 DIAGNOSIS — J449 Chronic obstructive pulmonary disease, unspecified: Secondary | ICD-10-CM | POA: Diagnosis not present

## 2019-11-18 NOTE — Telephone Encounter (Signed)
Need more info. Is patient not better since visit on 10-27-2019?

## 2019-11-18 NOTE — Telephone Encounter (Signed)
I called and spoke with patient. He states his symptoms went away after taking the round of prednisone and Doxycyline that was prescribed at the last office visit on 10/27/2019. He states the croupy cough started back up 5 days ago and seems to be getting worse. He has had some shortness of breath, but reports it is no different from the usual shortness of breath he has from COPD. Patient denies any fever, headache, sore throat or runny nose.

## 2019-11-25 ENCOUNTER — Other Ambulatory Visit: Payer: Self-pay | Admitting: Family Medicine

## 2019-11-26 DIAGNOSIS — G4733 Obstructive sleep apnea (adult) (pediatric): Secondary | ICD-10-CM | POA: Diagnosis not present

## 2019-12-07 DIAGNOSIS — J449 Chronic obstructive pulmonary disease, unspecified: Secondary | ICD-10-CM | POA: Diagnosis not present

## 2019-12-19 DIAGNOSIS — J449 Chronic obstructive pulmonary disease, unspecified: Secondary | ICD-10-CM | POA: Diagnosis not present

## 2019-12-30 ENCOUNTER — Other Ambulatory Visit: Payer: Self-pay | Admitting: Family Medicine

## 2019-12-30 DIAGNOSIS — R05 Cough: Secondary | ICD-10-CM

## 2019-12-30 DIAGNOSIS — J441 Chronic obstructive pulmonary disease with (acute) exacerbation: Secondary | ICD-10-CM

## 2019-12-30 DIAGNOSIS — R059 Cough, unspecified: Secondary | ICD-10-CM

## 2019-12-30 MED ORDER — DOXYCYCLINE HYCLATE 100 MG PO TABS: ORAL_TABLET | ORAL | 0 refills | Status: DC

## 2019-12-30 MED ORDER — PREDNISONE 20 MG PO TABS: ORAL_TABLET | ORAL | 0 refills | Status: DC

## 2019-12-30 NOTE — Telephone Encounter (Signed)
Was disconnected. Need to know why patient is requesting  a refill for Doxycycline.

## 2019-12-30 NOTE — Telephone Encounter (Signed)
Patient C/O a "hard croupy" cough. Wife states patient experiences this every 6- 8 weeks.

## 2019-12-30 NOTE — Telephone Encounter (Signed)
Requested medication (s) are due for refill today: Yes  Requested medication (s) are on the active medication list: Yes  Last refill:  11/18/19  Future visit scheduled: No  Notes to clinic:  See request.    Requested Prescriptions  Pending Prescriptions Disp Refills   doxycycline (VIBRA-TABS) 100 MG tablet 20 tablet 0      Off-Protocol Failed - 12/30/2019  3:38 PM      Failed - Medication not assigned to a protocol, review manually.      Passed - Valid encounter within last 12 months    Recent Outpatient Visits           2 months ago Cough   [facility] Birdie Sons, MD   5 months ago COPD exacerbation [facility])   [facility] Birdie Sons, MD   1 year ago Chest wall pain   [facility] Birdie Sons, MD   1 year ago Bursitis of other bursa of left hip   [facility] Birdie Sons, MD   1 year ago COPD exacerbation [facility])   [facility] Birdie Sons, MD

## 2019-12-30 NOTE — Telephone Encounter (Signed)
Medication Refill - Medicationdoxycycline (VIBRA-TABS) 100 MG tablet [[id number]    Preferred Pharmacy (with phone number or street name):  [place], [place] - [address]  [place] [address]  Phone: [phone] Fax: [phone]     Agent: Please be advised that RX refills may take up to 3 business days. We ask that you follow-up with your pharmacy.

## 2020-01-07 DIAGNOSIS — J449 Chronic obstructive pulmonary disease, unspecified: Secondary | ICD-10-CM | POA: Diagnosis not present

## 2020-01-14 DIAGNOSIS — J42 Unspecified chronic bronchitis: Secondary | ICD-10-CM | POA: Diagnosis not present

## 2020-01-15 ENCOUNTER — Other Ambulatory Visit: Payer: Self-pay | Admitting: Family Medicine

## 2020-01-15 NOTE — Telephone Encounter (Signed)
Requested medication (s) are due for refill today- yes  Requested medication (s) are on the active medication list -yes  Future visit scheduled -no  Last refill: 12/16/19  Notes to clinic: Patient is requesting refill of non delegated Rx.  Requested Prescriptions  Pending Prescriptions Disp Refills   LORazepam (ATIVAN) 1 MG tablet [Pharmacy Med Name: LORAZEPAM 1 MG TAB] 30 tablet     Sig: TAKE 1 TABLET BY MOUTH AT BEDTIME      Not Delegated - Psychiatry:  Anxiolytics/Hypnotics Failed - 01/15/2020  2:48 PM      Failed - This refill cannot be delegated      Failed - Urine Drug Screen completed in last 360 days.      Passed - Valid encounter within last 6 months    Recent Outpatient Visits           2 months ago Cough   [facility] Birdie Sons, MD   5 months ago COPD exacerbation [facility])   [facility] Birdie Sons, MD   1 year ago Chest wall pain   [facility] Birdie Sons, MD   1 year ago Bursitis of other bursa of left hip   [facility] Birdie Sons, MD   1 year ago COPD exacerbation [facility])   [facility] Birdie Sons, MD                  Requested Prescriptions  Pending Prescriptions Disp Refills   LORazepam (ATIVAN) 1 MG tablet [Pharmacy Med Name: LORAZEPAM 1 MG TAB] 30 tablet     Sig: TAKE 1 TABLET BY MOUTH AT BEDTIME      Not Delegated - Psychiatry:  Anxiolytics/Hypnotics Failed - 01/15/2020  2:48 PM      Failed - This refill cannot be delegated      Failed - Urine Drug Screen completed in last 360 days.      Passed - Valid encounter within last 6 months    Recent Outpatient Visits           2 months ago Cough   [facility] Birdie Sons, MD   5 months ago COPD exacerbation [facility])   [facility] Birdie Sons, MD   1 year ago Chest wall pain   [facility] Birdie Sons, MD   1 year ago Bursitis of other bursa  of left hip   [facility] Birdie Sons, MD   1 year ago COPD exacerbation [facility])   [facility] Birdie Sons, MD

## 2020-01-19 DIAGNOSIS — J449 Chronic obstructive pulmonary disease, unspecified: Secondary | ICD-10-CM | POA: Diagnosis not present

## 2020-01-27 ENCOUNTER — Other Ambulatory Visit: Payer: Self-pay | Admitting: Family Medicine

## 2020-01-27 NOTE — Telephone Encounter (Signed)
Requested medication (s) are due for refill today: yes  Requested medication (s) are on the active medication list: yes  Last refill: 11/25/20  Future visit scheduled: no  Notes to clinic:  no valid encounter within last 6 months    Requested Prescriptions  Pending Prescriptions Disp Refills   benazepril-hydrochlorthiazide (LOTENSIN HCT) 20-12.5 MG tablet [Pharmacy Med Name: BENAZEPRIL-HCTZ 20-12.5 MG TAB] 90 tablet 0    Sig: TAKE ONE TABLET EVERY DAY      Cardiovascular:  ACEI + Diuretic Combos Failed - 01/27/2020  1:26 PM      Failed - Na in normal range and within 180 days    Sodium  Date Value Ref Range Status  07/13/2019 133 (L) 135 - 145 mmol/L Final  12/12/2018 131 (L) 134 - 144 mmol/L Final  06/04/2013 136 136 - 145 mmol/L Final          Failed - K in normal range and within 180 days    Potassium  Date Value Ref Range Status  07/13/2019 4.4 3.5 - 5.1 mmol/L Final  06/04/2013 4.2 3.5 - 5.1 mmol/L Final          Failed - Cr in normal range and within 180 days    Creatinine  Date Value Ref Range Status  06/04/2013 1.12 0.60 - 1.30 mg/dL Final   Creatinine, Ser  Date Value Ref Range Status  07/13/2019 0.92 0.61 - 1.24 mg/dL Final          Failed - Ca in normal range and within 180 days    Calcium  Date Value Ref Range Status  07/13/2019 8.1 (L) 8.9 - 10.3 mg/dL Final   Calcium, Total  Date Value Ref Range Status  06/04/2013 8.9 8.5 - 10.1 mg/dL Final          Failed - Valid encounter within last 6 months    Recent Outpatient Visits           3 months ago Cough   [facility] Birdie Sons, MD   6 months ago COPD exacerbation [facility])   [facility] Birdie Sons, MD   1 year ago Chest wall pain   [facility] Birdie Sons, MD   1 year ago Bursitis of other bursa of left hip   [facility] Birdie Sons, MD   1 year ago COPD exacerbation [facility])   [facility] Birdie Sons, MD              Passed - Patient is not pregnant      Passed - Last BP in normal range    BP Readings from Last 1 Encounters:  08/07/19 124/70

## 2020-01-29 ENCOUNTER — Ambulatory Visit
Admission: RE | Admit: 2020-01-29 | Discharge: 2020-01-29 | Disposition: A | Discharge status: Home / Self Care | Payer: Medicare Other | Source: Ambulatory Visit | Attending: Oncology | Admitting: Oncology

## 2020-01-29 ENCOUNTER — Other Ambulatory Visit: Payer: Self-pay

## 2020-01-29 DIAGNOSIS — C3431 Malignant neoplasm of lower lobe, right bronchus or lung: Secondary | ICD-10-CM | POA: Diagnosis not present

## 2020-01-29 LAB — POCT I-STAT CREATININE: Creatinine, Ser: 1.1 mg/dL (ref 0.61–1.24)

## 2020-01-29 MED ORDER — IOHEXOL 300 MG/ML  SOLN
75.0000 mL | Freq: Once | INTRAMUSCULAR | Status: AC | PRN
  Administered 2020-01-29: 75 mL via INTRAVENOUS

## 2020-01-30 ENCOUNTER — Other Ambulatory Visit: Payer: Self-pay | Admitting: *Deleted

## 2020-01-30 ENCOUNTER — Ambulatory Visit: Payer: Medicare Other | Admitting: Oncology

## 2020-01-30 ENCOUNTER — Encounter: Payer: Self-pay | Admitting: Radiation Oncology

## 2020-01-30 ENCOUNTER — Other Ambulatory Visit: Payer: Self-pay

## 2020-01-30 ENCOUNTER — Ambulatory Visit
Admission: RE | Admit: 2020-01-30 | Discharge: 2020-01-30 | Disposition: A | Discharge status: Home / Self Care | Payer: Medicare Other | Source: Ambulatory Visit | Attending: Radiation Oncology | Admitting: Radiation Oncology

## 2020-01-30 VITALS — BP 134/17 | HR 70 | Temp 97.0°F | Resp 16 | Wt 177.2 lb

## 2020-01-30 DIAGNOSIS — Z85118 Personal history of other malignant neoplasm of bronchus and lung: Secondary | ICD-10-CM | POA: Diagnosis not present

## 2020-01-30 DIAGNOSIS — R0602 Shortness of breath: Secondary | ICD-10-CM | POA: Diagnosis not present

## 2020-01-30 DIAGNOSIS — Z923 Personal history of irradiation: Secondary | ICD-10-CM | POA: Insufficient documentation

## 2020-01-30 DIAGNOSIS — C3431 Malignant neoplasm of lower lobe, right bronchus or lung: Secondary | ICD-10-CM

## 2020-01-30 NOTE — Progress Notes (Signed)
Radiation Oncology Follow up Note  Name: James [name]   Date:   01/30/2020 MRN:  [id number] DOB: 05-28-1929    This 86 y.o. 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  REFERRING PROVIDER: Birdie Sons, MD  HPI: Patient is a 86 year old male now out 2 years having completed radiation therapy to his right lower lobe for stage IIb (T3 N0 M0) adenocarcinoma seen today in routine follow-up he is doing fairly well still has some shortness of breath and dyspnea on exertion.  No cough hemoptysis or chest tightness..  Recent CT scan of the chest shows posttreatment changes in the right lower lobe and right middle lobe with no findings suggest recurrent or metastatic disease.  COMPLICATIONS OF TREATMENT: none  FOLLOW UP COMPLIANCE: keeps appointments   PHYSICAL EXAM:  BP (!) 134/17   Pulse 70   Temp (!) 97 F (36.1 C) (Tympanic)   Resp 16   Wt 177 lb 3.2 oz (80.4 kg)   BMI 26.94 kg/m  Well-developed well-nourished patient in NAD. HEENT reveals PERLA, EOMI, discs not visualized.  Oral cavity is clear. No oral mucosal lesions are identified. Neck is clear without evidence of cervical or supraclavicular adenopathy. Lungs are clear to A&P. Cardiac examination is essentially unremarkable with regular rate and rhythm without murmur rub or thrill. Abdomen is benign with no organomegaly or masses noted. Motor sensory and DTR levels are equal and symmetric in the upper and lower extremities. Cranial nerves II through XII are grossly intact. Proprioception is intact. No peripheral adenopathy or edema is identified. No motor or sensory levels are noted. Crude visual fields are within normal range.  RADIOLOGY RESULTS: CT scans reviewed compatible with above-stated findings  PLAN: Present time patient continues to do well 2 years out from radiation therapy with no evidence of disease or progression of  disease.  I am pleased with his overall progress.  I have asked to see him back in 1 year for follow-up.  Patient knows to call with any concerns at any time.  I would like to take this opportunity to thank you for allowing me to participate in the care of your patient.[name], MD

## 2020-02-02 ENCOUNTER — Other Ambulatory Visit: Payer: Self-pay | Admitting: Family Medicine

## 2020-02-02 DIAGNOSIS — C3431 Malignant neoplasm of lower lobe, right bronchus or lung: Secondary | ICD-10-CM

## 2020-02-02 NOTE — Telephone Encounter (Signed)
Requested medications are due for refill today?  Yes  - originally filled back in November 2020 when patient had acute exacerbation of COPD.    Requested medications are on active medication list?  Yes  Last Refill:  11/05/2019 115 ml with no refills  Future visit scheduled?  No  Notes to Clinic:

## 2020-02-05 ENCOUNTER — Other Ambulatory Visit: Payer: Self-pay | Admitting: Family Medicine

## 2020-02-05 NOTE — Telephone Encounter (Signed)
Requested medication (s) are due for refill today:   Yes  Requested medication (s) are on the active medication list:   Yes  Future visit scheduled:   No   Last ordered: 12/17/2018  #90  4 refills.     Returned because failed protocol.    Labs due.   Requested Prescriptions  Pending Prescriptions Disp Refills   atorvastatin (LIPITOR) 80 MG tablet [Pharmacy Med Name: ATORVASTATIN 80MG  TABLETS] 90 tablet 4    Sig: TAKE 1 TABLET BY MOUTH DAILY GENERIC EQUIVALENT FOR LIPITOR      Cardiovascular:  Antilipid - Statins Failed - 02/05/2020 10:50 AM      Failed - Total Cholesterol in normal range and within 360 days    Cholesterol  Date Value Ref Range Status  12/03/2018 99 0 - 200 mg/dL Final          Failed - LDL in normal range and within 360 days    LDL Cholesterol  Date Value Ref Range Status  12/03/2018 34 0 - 99 mg/dL Final    Comment:           Total Cholesterol/HDL:CHD Risk Coronary Heart Disease Risk Table                     Men   Women  1/2 Average Risk   3.4   3.3  Average Risk       5.0   4.4  2 X Average Risk   9.6   7.1  3 X Average Risk  23.4   11.0        Use the calculated Patient Ratio above and the CHD Risk Table to determine the patient's CHD Risk.        ATP III CLASSIFICATION (LDL):  <100     mg/dL   Optimal  100-129  mg/dL   Near or Above                    Optimal  130-159  mg/dL   Borderline  160-189  mg/dL   High  >190     mg/dL   Very High Performed at [facility], [address] [place], [place] [address]           Failed - HDL in normal range and within 360 days    HDL  Date Value Ref Range Status  12/03/2018 55 >40 mg/dL Final          Failed - Triglycerides in normal range and within 360 days    Triglycerides  Date Value Ref Range Status  12/03/2018 51 <150 mg/dL Final          Passed - Patient is not pregnant      Passed - Valid encounter within last 12 months    Recent Outpatient Visits           3 months  ago Cough   [facility] Birdie Sons, MD   6 months ago COPD exacerbation [facility])   [facility] Birdie Sons, MD   1 year ago Chest wall pain   [facility] Bird[facility], [facility]   1 year ago Bursitis of other bursa of left hip   [facility] Birdie Sons, MD   1 year ago COPD exacerbation [facility])   [facility] Birdie Sons, MD

## 2020-02-07 DIAGNOSIS — J449 Chronic obstructive pulmonary disease, unspecified: Secondary | ICD-10-CM | POA: Diagnosis not present

## 2020-02-16 DIAGNOSIS — J449 Chronic obstructive pulmonary disease, unspecified: Secondary | ICD-10-CM | POA: Diagnosis not present

## 2020-02-18 ENCOUNTER — Emergency Department
Admission: EM | Admit: 2020-02-18 | Discharge: 2020-02-18 | Disposition: A | Discharge status: Home / Self Care | Payer: Medicare Other | Attending: Emergency Medicine | Admitting: Emergency Medicine

## 2020-02-18 ENCOUNTER — Emergency Department: Payer: Medicare Other

## 2020-02-18 ENCOUNTER — Other Ambulatory Visit: Payer: Self-pay

## 2020-02-18 DIAGNOSIS — Z20822 Contact with and (suspected) exposure to covid-19: Secondary | ICD-10-CM | POA: Diagnosis not present

## 2020-02-18 DIAGNOSIS — R0789 Other chest pain: Secondary | ICD-10-CM | POA: Diagnosis not present

## 2020-02-18 DIAGNOSIS — J441 Chronic obstructive pulmonary disease with (acute) exacerbation: Secondary | ICD-10-CM | POA: Diagnosis not present

## 2020-02-18 DIAGNOSIS — R0602 Shortness of breath: Secondary | ICD-10-CM | POA: Insufficient documentation

## 2020-02-18 DIAGNOSIS — R079 Chest pain, unspecified: Secondary | ICD-10-CM | POA: Diagnosis not present

## 2020-02-18 DIAGNOSIS — I10 Essential (primary) hypertension: Secondary | ICD-10-CM | POA: Insufficient documentation

## 2020-02-18 DIAGNOSIS — Z951 Presence of aortocoronary bypass graft: Secondary | ICD-10-CM | POA: Insufficient documentation

## 2020-02-18 DIAGNOSIS — Z79899 Other long term (current) drug therapy: Secondary | ICD-10-CM | POA: Diagnosis not present

## 2020-02-18 DIAGNOSIS — R000 Tachycardia, unspecified: Secondary | ICD-10-CM | POA: Diagnosis not present

## 2020-02-18 DIAGNOSIS — Z87891 Personal history of nicotine dependence: Secondary | ICD-10-CM | POA: Diagnosis not present

## 2020-02-18 DIAGNOSIS — I2510 Atherosclerotic heart disease of native coronary artery without angina pectoris: Secondary | ICD-10-CM | POA: Diagnosis not present

## 2020-02-18 DIAGNOSIS — R0902 Hypoxemia: Secondary | ICD-10-CM | POA: Diagnosis not present

## 2020-02-18 LAB — COMPREHENSIVE METABOLIC PANEL
ALT: 24 U/L (ref 0–44)
AST: 28 U/L (ref 15–41)
Albumin: 3.7 g/dL (ref 3.5–5.0)
Alkaline Phosphatase: 81 U/L (ref 38–126)
Anion gap: 14 (ref 5–15)
BUN: 27 mg/dL — ABNORMAL HIGH (ref 8–23)
CO2: 29 mmol/L (ref 22–32)
Calcium: 9.1 mg/dL (ref 8.9–10.3)
Chloride: 99 mmol/L (ref 98–111)
Creatinine, Ser: 1.3 mg/dL — ABNORMAL HIGH (ref 0.61–1.24)
GFR calc Af Amer: 56 mL/min — ABNORMAL LOW (ref >60)
GFR calc non Af Amer: 49 mL/min — ABNORMAL LOW (ref >60)
Glucose, Bld: 113 mg/dL — ABNORMAL HIGH (ref 70–99)
Potassium: 4.7 mmol/L (ref 3.5–5.1)
Sodium: 142 mmol/L (ref 135–145)
Total Bilirubin: 0.9 mg/dL (ref 0.3–1.2)
Total Protein: 6.1 g/dL — ABNORMAL LOW (ref 6.5–8.1)

## 2020-02-18 LAB — CBC
HCT: 37.4 % — ABNORMAL LOW (ref 39.0–52.0)
Hemoglobin: 11.9 g/dL — ABNORMAL LOW (ref 13.0–17.0)
MCH: 29.8 pg (ref 26.0–34.0)
MCHC: 31.8 g/dL (ref 30.0–36.0)
MCV: 93.7 fL (ref 80.0–100.0)
Platelets: 147 K/uL — ABNORMAL LOW (ref 150–400)
RBC: 3.99 MIL/uL — ABNORMAL LOW (ref 4.22–5.81)
RDW: 14 % (ref 11.5–15.5)
WBC: 4.9 K/uL (ref 4.0–10.5)
nRBC: 0 % (ref 0.0–0.2)

## 2020-02-18 LAB — RESPIRATORY PANEL BY RT PCR (FLU A&B, COVID)
Influenza A by PCR: NEGATIVE
Influenza B by PCR: NEGATIVE
SARS Coronavirus 2 by RT PCR: NEGATIVE

## 2020-02-18 LAB — PROTIME-INR
INR: 1.4 — ABNORMAL HIGH (ref 0.8–1.2)
Prothrombin Time: 17.2 s — ABNORMAL HIGH (ref 11.4–15.2)

## 2020-02-18 LAB — TROPONIN I (HIGH SENSITIVITY)
Troponin I (High Sensitivity): 11 ng/L (ref <18)
Troponin I (High Sensitivity): 10 ng/L (ref <18)

## 2020-02-18 LAB — APTT: aPTT: 33 seconds (ref 24–36)

## 2020-02-18 LAB — BRAIN NATRIURETIC PEPTIDE: B Natriuretic Peptide: 102 pg/mL — ABNORMAL HIGH (ref 0.0–100.0)

## 2020-02-18 MED ORDER — IPRATROPIUM-ALBUTEROL 0.5-2.5 (3) MG/3ML IN SOLN
3.0000 mL | Freq: Once | RESPIRATORY_TRACT | Status: AC
  Administered 2020-02-18: 3 mL via RESPIRATORY_TRACT
  Filled 2020-02-18: qty 3

## 2020-02-18 MED ORDER — PREDNISONE 20 MG PO TABS: 40.0000 mg | ORAL_TABLET | Freq: Every day | ORAL | 0 refills | Status: AC

## 2020-02-18 MED ORDER — METHYLPREDNISOLONE SODIUM SUCC 125 MG IJ SOLR
125.0000 mg | Freq: Once | INTRAMUSCULAR | Status: AC
  Administered 2020-02-18: 125 mg via INTRAVENOUS
  Filled 2020-02-18: qty 2

## 2020-02-18 MED ORDER — AZITHROMYCIN 250 MG PO TABS: ORAL_TABLET | ORAL | 0 refills | Status: AC

## 2020-02-18 NOTE — ED Provider Notes (Signed)
[facility] Emergency Department Provider Note  ____________________________________________   First MD Initiated Contact with Patient 02/18/20 1226     (approximate)  I have reviewed the triage vital signs and the nursing notes.   HISTORY  Chief Complaint Chest Pain    HPI James [name] is a 86 y.o. male with coronary disease status post bypass, COPD, hypertension, paroxysmal A. fib on Eliquis, stroke who comes in with chest pain.  Patient had some chest discomfort in the middle of his chest that started about 2 hours ago and lasted about 30 minutes and then went away.  Currently denies any symptoms.  Patient states that it was just a funny feeling in the middle of his chest that lasted 30 minutes and then went away on its own.  He did take a full dose aspirin and did take an antiacid.  He was unsure if it was acid reflux.  States that he never had chest pain from his prior CABG.  He wanted to make sure that it was not his heart.  He  currently feels at his baseline.  He does have significant COPD which uses oxygen at nighttime.  States he has had a little bit of worsening cough and maybe little bit worsening shortness of breath recently.          Past Medical History:  Diagnosis Date  . Arthritis   . Back pain   . CAD (coronary artery disease)   . Cataract    right  . COPD (chronic obstructive pulmonary disease) ([place])    SPiriva and SYmbicort daily. Albuterol as needed  . Diverticulosis   . Dyspnea    with exertion  . Enlarged prostate    takes Flomax daily  . GERD (gastroesophageal reflux disease)   . History of chicken pox   . History of gout   . History of measles   . History of mumps   . HOH (hard of hearing)   . Hyperglycemia   . Hyperlipidemia    takes Atorvastatin daily  . Hypertension    takes Metoprolol daily as well as Lotensin HCT  . Hypotension   . Joint pain   . Microscopic colitis   . PAF (paroxysmal atrial fibrillation)  ([place]), RVR 03/14/2017  . Pneumonia   . Prostate cancer ([place])   . Stroke [facility])    TIA  . TIA (transient ischemic attack)   . Weakness    numbness and tingling.mainly on right    Patient Active Problem List   Diagnosis Date Noted  . Acute on chronic respiratory failure with hypoxia ([place]) 07/14/2019  . Acute on chronic respiratory failure with hypoxemia ([place]) 07/13/2019  . Chest pain 12/02/2018  . Hyponatremia 12/02/2018  . AKI (acute kidney injury) ([place]) 12/02/2018  . Primary adenocarcinoma of lower lobe of right lung ([place]) 02/16/2018  . Late effect of cerebrovascular accident (CVA) 11/14/2017  . CAP (community acquired pneumonia) 11/06/2017  . Cerebral vascular disease 10/31/2017  . CVA (cerebral vascular accident) ([place]) 10/31/2017  . Acute CVA (cerebrovascular accident) ([place]) 10/30/2017  . Lung mass 09/11/2017  . Hematoma of neck 03/14/2017  . PAF (paroxysmal atrial fibrillation) ([place]) 03/14/2017  . Chronic systolic heart failure ([place]) 03/14/2017  . Dysphagia 03/13/2017  . Cervical spondylosis with myelopathy 03/07/2017  . Restrictive lung disease 09/26/2016  . Cervical neck pain with evidence of disc disease 07/04/2016  . PVC (premature ventricular contraction) 11/16/2015  . Abdominal pain 07/27/2015  . Adenocarcinoma of prostate ([place])  07/27/2015  . Arthritis 07/27/2015  . Blood in feces 07/27/2015  . BPH (benign prostatic hyperplasia) 07/27/2015  . COPD (chronic obstructive pulmonary disease) ([place]) 07/27/2015  . GERD (gastroesophageal reflux disease) 07/27/2015  . Gout 07/27/2015  . Hyperglycemia 07/27/2015  . Hypotension 07/27/2015  . Insomnia 07/27/2015  . Shortness of breath 07/27/2015  . TIA (transient ischemic attack) 07/27/2015  . Urinary hesitancy 09/02/2012  . ED (erectile dysfunction) of organic origin 06/10/2010  . CAD (coronary artery disease) 03/14/2010  . LBP (low back pain) 03/01/2009  . Allergic rhinitis 09/28/2008  . Essential (primary) hypertension  08/02/2007  . Hyperlipidemia 08/02/2007  . Acquired spondylolisthesis 03/20/2005  . Diverticulosis of colon without hemorrhage 12/18/2000  . Arthropathy 12/18/1998    Past Surgical History:  Procedure Laterality Date  . ANTERIOR CERVICAL DECOMP/DISCECTOMY FUSION N/A 03/07/2017   Procedure: ANTERIOR CERVICAL DECOMPRESSION/DISCECTOMY FUSION CERVICAL THREE- CERVICAL FOUR, CERVICAL FOUR- CERVICAL FIVE;  Surgeon: Newman Pies, MD;  Location: [place];  Service: Neurosurgery;  Laterality: N/A;  ANTERIOR CERVICAL DECOMPRESSION/DISCECTOMY FUSION CERVICAL 3- CERVICAL 4, CERVIACL 4- CERVICAL 5  . APPENDECTOMY  1950  . CARDIAC CATHETERIZATION  05/29/2013   EF=40-45%. Moderate pulmonary hypertension. Infero/ lateral hypokinesis  . cataract surgery Left   . COLONOSCOPY    . CORONARY ARTERY BYPASS GRAFT  2011   x 5  . ESOPHAGOGASTRODUODENOSCOPY (EGD) WITH PROPOFOL N/A 07/01/2018   Procedure: ESOPHAGOGASTRODUODENOSCOPY (EGD) WITH PROPOFOL;  Surgeon: Lin Landsman, MD;  Location: [place];  Service: Gastroenterology;  Laterality: N/A;  . EYE SURGERY    . FLEXIBLE BRONCHOSCOPY N/A 11/19/2017   Procedure: FLEXIBLE BRONCHOSCOPY;  Surgeon: Laverle Hobby, MD;  Location: [facility];  Service: Pulmonary;  Laterality: N/A;  . MRI BRAIN  06/07/2013   Mild chronic involutional changes. No acute abnormalities  . MRI of neck    . Myocardial Perfusion Scan  05/29/2013   Abnormal myocardial perfusion image consistent with myocardial infarction  . PROSTATE SURGERY  11/18/2012   radiation seed  . TOTAL HIP ARTHROPLASTY Left 1997   hip joint replacement    Prior to Admission medications   Medication Sig Start Date End Date Taking? Authorizing Provider  albuterol (PROAIR HFA) 108 (90 Base) MCG/ACT inhaler TAKE 2 PUFFS INTO LUNGS EVERY 6 HOURS ASNEEDED FOR WHEEZING OR SHORTNESS OF BREATH 12/10/18   Laverle Hobby, MD  arformoterol (BROVANA) 15 MCG/2ML NEBU Take 2 mLs (15 mcg total) by  nebulization 2 (two) times daily. DX:J44.9 08/27/19   Laverle Hobby, MD  atorvastatin (LIPITOR) 80 MG tablet Take 1 tablet (80 mg total) by mouth daily at 6 PM. Please schedule office visit before any future refills 02/05/20   Birdie Sons, MD  azelastine (OPTIVAR) 0.05 % ophthalmic solution APPLY ONE DROP TO EYE TWICE DAILY 12/10/18   Birdie Sons, MD  benazepril-hydrochlorthiazide (LOTENSIN HCT) 20-12.5 MG tablet TAKE ONE TABLET EVERY DAY 01/27/20   Birdie Sons, MD  beta carotene w/minerals (OCUVITE) tablet Take 1 tablet by mouth daily.    [provider]  budesonide (PULMICORT) 0.5 MG/2ML nebulizer solution Take 2 mLs (0.5 mg total) by nebulization 2 (two) times daily. Dx:j44.9 08/27/19   Laverle Hobby, MD  chlorpheniramine-HYDROcodone (TUSSIONEX) 10-8 MG/5ML SUER TAKE 1 TEASPOONFUL EVERY 12 HOURS AS NEEDED FOR COUGH 02/02/20   Birdie Sons, MD  ELIQUIS 5 MG TABS tablet TAKE 1 TABLET BY MOUTH TWICE DAILY 04/28/19   Birdie Sons, MD  famotidine (PEPCID) 20 MG tablet Take 1 tablet by mouth 1 day or  1 dose. 07/28/19   [provider]  fluticasone (FLONASE) 50 MCG/ACT nasal spray Place 2 sprays into both nostrils 2 (two) times daily. 04/22/19   Birdie Sons, MD  lansoprazole (PREVACID) 30 MG capsule TAKE 1 CAPSULE EVERY DAY 04/28/19   Birdie Sons, MD  LORazepam (ATIVAN) 1 MG tablet TAKE 1 TABLET BY MOUTH AT BEDTIME 01/15/20   Birdie Sons, MD  Magnesium 400 MG CAPS Take 1 capsule by mouth daily.     [provider]  metoprolol tartrate (LOPRESSOR) 50 MG tablet TAKE 1 TABLET BY MOUTH TWICE DAILY 11/26/19   Birdie Sons, MD  montelukast (SINGULAIR) 10 MG tablet Take 1 tablet (10 mg total) by mouth at bedtime. 04/28/19   Birdie Sons, MD  MULTIPLE VITAMIN PO Take 1 tablet by mouth daily.     [provider]  Omega-3 Fatty Acids (FISH OIL) 1000 MG CAPS Take 1 capsule daily by mouth.    [provider]  predniSONE  (DELTASONE) 20 MG tablet TAKE ONE TABLET TWICE DAILY WITH A MEAL FOR 7 DAYS 12/30/19   Birdie Sons, MD  SPIRIVA HANDIHALER 18 MCG inhalation capsule INHALE CONTENTS OF 1 CAPSULE AS DIRECTEDONCE A DAY VIA HANDIHALER 03/12/19   Birdie Sons, MD  SYMBICORT 160-4.5 MCG/ACT inhaler 2 PUFFS INTO LUNGS TWICE DAILY 08/28/19   Birdie Sons, MD  tamsulosin (FLOMAX) 0.4 MG CAPS capsule Take 1 capsule (0.4 mg total) by mouth daily. 01/31/19   Birdie Sons, MD    Allergies Indomethacin  Family History  Problem Relation Age of Onset  . Heart attack Mother   . Stroke Father   . Heart attack Father   . Hypertension Father   . Heart attack Sister   . Bladder Cancer Brother   . Kidney cancer Brother   . Heart Problems Brother     Social History Social History   Tobacco Use  . Smoking status: Former Smoker    Packs/day: 1.00    Years: 11.00    Pack years: 11.00    Types: Cigarettes    Quit date: 02/12/1984    Years since quitting: 36.0  . Smokeless tobacco: Never Used  . Tobacco comment: quit smoking 30 yrs ago  Substance Use Topics  . Alcohol use: Yes    Alcohol/week: 0.0 standard drinks    Comment: beer occasionally  . Drug use: No      Review of Systems Constitutional: No fever/chills Eyes: No visual changes. ENT: No sore throat. Cardiovascular: Positive chest pain Respiratory: Positive shortness of breath and positive cough Gastrointestinal: No abdominal pain.  No nausea, no vomiting.  No diarrhea.  No constipation. Genitourinary: Negative for dysuria. Musculoskeletal: Negative for back pain. Skin: Negative for rash. Neurological: Negative for headaches, focal weakness or numbness. All other ROS negative ____________________________________________   PHYSICAL EXAM:  VITAL SIGNS: Blood pressure (!) 108/56, pulse 98, temperature 97.7 F (36.5 C), temperature source Oral, resp. rate 15, height _0  (1.727 m), weight 79.4 kg, SpO2 96 %.  Constitutional: Alert  and oriented. Well appearing and in no acute distress. Eyes: Conjunctivae are normal. EOMI. Head: Atraumatic. Nose: No congestion/rhinnorhea. Mouth/Throat: Mucous membranes are moist.   Neck: No stridor. Trachea Midline. FROM Cardiovascular: Normal rate, regular rhythm. Grossly normal heart sounds.  Good peripheral circulation.  Midsternal scar well-healing Respiratory: Normal respiratory effort.  No retractions.  Bilateral wheezing. Gastrointestinal: Soft and nontender. No distention. No abdominal bruits.  Musculoskeletal: Trace edema may be slightly  worse than the left but according to patient he had his graft done on his left leg and that is baseline for him no joint effusions. Neurologic:  Normal speech and language. No gross focal neurologic deficits are appreciated.  Skin:  Skin is warm, dry and intact. No rash noted. Psychiatric: Mood and affect are normal. Speech and behavior are normal. GU: Deferred   ____________________________________________   LABS (all labs ordered are listed, but only abnormal results are displayed)  Labs Reviewed  CBC - Abnormal; Notable for the following components:      Result Value   RBC 3.99 (*)    Hemoglobin 11.9 (*)    HCT 37.4 (*)    Platelets 147 (*)    All other components within normal limits  COMPREHENSIVE METABOLIC PANEL - Abnormal; Notable for the following components:   Glucose, Bld 113 (*)    BUN 27 (*)    Creatinine, Ser 1.30 (*)    Total Protein 6.1 (*)    GFR calc non Af Amer 49 (*)    GFR calc Af Amer 56 (*)    All other components within normal limits  PROTIME-INR - Abnormal; Notable for the following components:   Prothrombin Time 17.2 (*)    INR 1.4 (*)    All other components within normal limits  BRAIN NATRIURETIC PEPTIDE - Abnormal; Notable for the following components:   B Natriuretic Peptide 102.0 (*)    All other components within normal limits  RESPIRATORY PANEL BY RT PCR (FLU A&B, COVID)  APTT  TROPONIN I  (HIGH SENSITIVITY)  TROPONIN I (HIGH SENSITIVITY)   ____________________________________________   ED ECG REPORT I, Vanessa Buchanan, the attending physician, personally viewed and interpreted this ECG.  EKG is normal sinus rate of 74, no ST elevation, no T wave inversions, does have a left bundle branch block.  Looks similar to prior EKGs ____________________________________________  RADIOLOGY Robert Bellow, personally viewed and evaluated these images (plain radiographs) as part of my medical decision making, as well as reviewing the written report by the radiologist.  ED MD interpretation: Chest x-ray with some possible right trace effusion  Official radiology report(s): DG Chest 2 View  Result Date: 02/18/2020 CLINICAL DATA:  Left-sided chest pain EXAM: CHEST - 2 VIEW COMPARISON:  07/13/2019 FINDINGS: Post CABG changes. Stable cardiomediastinal contours. Elevation of the right hemidiaphragm with linear right basilar scarring versus atelectasis. Probable trace right pleural effusion. Left lung is clear. No pneumothorax. Advanced degenerative changes of the left shoulder. IMPRESSION: Chronic right basilar volume loss with linear right basilar scarring versus atelectasis. Probable trace right pleural effusion. Electronically Signed   By: Davina Poke D.O.   On: 02/18/2020 13:55    ____________________________________________   PROCEDURES  Procedure(s) performed (including Critical Care):  Procedures   ____________________________________________   INITIAL IMPRESSION / ASSESSMENT AND PLAN / ED COURSE   LEXTON HIDALGO was evaluated in Emergency Department on 02/18/2020 for the symptoms described in the history of present illness. He was evaluated in the context of the global COVID-19 pandemic, which necessitated consideration that the patient might be at risk for infection with the SARS-CoV-2 virus that causes COVID-19. Institutional protocols and algorithms that pertain to the  evaluation of patients at risk for COVID-19 are in a state of rapid change based on information released by regulatory bodies including the CDC and federal and state organizations. These policies and algorithms were followed during the patient's care in the ED.    Most  Likely DDx:  -ACS versus acid reflux versus atypical chest pain will get cardiac markers to further evaluate -Patient did report some worsening cough and mild shortness of breath.  Most likely secondary to COPD given his bilateral wheezing.  Will give some breathing treatments and steroids.  DDx that was also considered d/t potential to cause harm, but was found less likely based on history and physical (as detailed above): -PNA (no fevers, cough but CXR to evaluate) -PNX (reassured with equal b/l breath sounds, CXR to evaluate) -Symptomatic anemia (will get H&H) -Pulmonary embolism as no sob at rest, not pleuritic in nature, no hypoxia, patient is on a blood thinner -Aortic Dissection as no tearing pain and no radiation to the mid back, pulses equal -Pericarditis no rub on exam, EKG changes or hx to suggest dx -Tamponade (no notable SOB, tachycardic, hypotensive) -Esophageal rupture (no h/o diffuse vomitting/no crepitus)  Patient was reported to be hypotensive with EMS put his pressures here never been hypotensive.  We will get orthostatics to further evaluate.  Cardiac markers negative x2.  Discussed with patient he continues to not have any chest pain.  We discussed that he does have risk factors for chest pain but given his isolated short episode and since his symptoms have since resolved that he can follow-up with his cardiologist.  Patient feels very comfortable with this as he is hoping to go home.  Kidney function was slightly elevated at 1.3.  Orthostatics were negative.  Patient never felt lightheaded and felt very steady on his feet.    Patient ambulated.  Patient stated he felt a little short of breath but it was  his baseline from his COPD nothing that he was worried about.  He briefly desatted down to 88% for about 2 seconds and then coughed and recovered back up to 100%.  He feels very comfortable with going home at this time.  We discussed fluids as he had some lower blood pressures in the field but here says he is not orthostatic and he has no lightheadedness he would like to hold off on fluids.  We will discharge patient with treatment for COPD exacerbation patient to follow-up with cardiology for his heart.      ____________________________________________   FINAL CLINICAL IMPRESSION(S) / ED DIAGNOSES   Final diagnoses:  Chest pain, unspecified type  COPD exacerbation ([place])     MEDICATIONS GIVEN DURING THIS VISIT:  Medications  ipratropium-albuterol (DUONEB) 0.5-2.5 (3) MG/3ML nebulizer solution 3 mL (3 mLs Nebulization Given 02/18/20 1421)  ipratropium-albuterol (DUONEB) 0.5-2.5 (3) MG/3ML nebulizer solution 3 mL (3 mLs Nebulization Given 02/18/20 1421)  ipratropium-albuterol (DUONEB) 0.5-2.5 (3) MG/3ML nebulizer solution 3 mL (3 mLs Nebulization Given 02/18/20 1421)  methylPREDNISolone sodium succinate (SOLU-MEDROL) 125 mg/2 mL injection 125 mg (125 mg Intravenous Given 02/18/20 1421)     ED Discharge Orders         Ordered    predniSONE (DELTASONE) 20 MG tablet  Daily     02/18/20 1606    azithromycin (ZITHROMAX Z-PAK) 250 MG tablet     02/18/20 1606           Note:  This document was prepared using Dragon voice recognition software and may include unintentional dictation errors.   Vanessa Del [place], MD 02/19/20 [phone]

## 2020-02-18 NOTE — Discharge Instructions (Addendum)
Your work-up was reassuring.  Your kidney function was slightly elevated at 1.3 but your blood pressures look okay he did feel lightheaded so we held off on fluids so we do not want to fluid overload you.  Since you stated you had a little bit worsening shortness of breath and cough we did test you coronavirus and it was negative.  We are treating you for COPD flare.  Your cardiac markers were negative but you should follow-up with your cardiologist to let them know that this happened just because you are high risk for heart problems.  IMPRESSION:  Chronic right basilar volume loss with linear right basilar scarring  versus atelectasis. Probable trace right pleural effusion.

## 2020-02-18 NOTE — ED Triage Notes (Signed)
pt arrives via ems from home. ems reports called out for CP, wife states he had a brief min of intense chest pain. Pt denied CP with EMS. hx of 5 bypasses Dr Kyra Searles. resently cut BP med dose in half also takes 40mg  lasix daily.  COPD with O2 at night when needed. EMS reports pt took one aspirin at home.   On arrival pt a&o x 4, NAD. Pot reports no pain at this time, and states chest pain lasted around 30 mins.   EMS vitals 79/50 laying sitting 70/40 Hr 64

## 2020-02-18 NOTE — ED Notes (Signed)
Pt wife contacted via phone at this time. She reports administering 324mg  of Asprin prior to ems arrival. She also states to contact her if pt is discharged, she will come transport pt home.

## 2020-02-18 NOTE — ED Notes (Signed)
Pt to Xray at this time

## 2020-02-25 ENCOUNTER — Other Ambulatory Visit: Payer: Self-pay | Admitting: Family Medicine

## 2020-02-25 NOTE — Telephone Encounter (Signed)
Courtesy refill. Message sent for pt. To make an appointment.

## 2020-02-26 ENCOUNTER — Telehealth: Payer: Self-pay

## 2020-02-26 NOTE — Telephone Encounter (Signed)
Received request for last three months of office notes from James [name]. Per our records it appears that pt has been seeing Dr. Lanney Gins. Request that been [facility]ed back to [facility] with note to defer to Dr. Lanney Gins.

## 2020-03-06 ENCOUNTER — Other Ambulatory Visit: Payer: Self-pay | Admitting: Family Medicine

## 2020-03-06 DIAGNOSIS — J449 Chronic obstructive pulmonary disease, unspecified: Secondary | ICD-10-CM | POA: Diagnosis not present

## 2020-03-06 NOTE — Telephone Encounter (Signed)
Requested Prescriptions  Pending Prescriptions Disp Refills  . azelastine (OPTIVAR) 0.05 % ophthalmic solution [Pharmacy Med Name: AZELASTINE HCL 0.05% EYE SOLN ML] 6 mL 5    Sig: APPLY 1 DROP INTO THE EYE TWICE DAILY ASDIRECTED     Ophthalmology:  Antiallergy Passed - 03/06/2020 12:57 PM      Passed - Valid encounter within last 12 months    Recent Outpatient Visits          4 months ago Cough   [facility] Birdie Sons, MD   7 months ago COPD exacerbation [facility])   [facility] Birdie Sons, MD   1 year ago Chest wall pain   [facility] Birdie Sons, MD   1 year ago Bursitis of other bursa of left hip   [facility] Birdie Sons, MD   1 year ago COPD exacerbation [facility])   [facility] Birdie Sons, MD

## 2020-03-15 ENCOUNTER — Other Ambulatory Visit: Payer: Self-pay | Admitting: Family Medicine

## 2020-03-17 ENCOUNTER — Ambulatory Visit (INDEPENDENT_AMBULATORY_CARE_PROVIDER_SITE_OTHER): Payer: Medicare Other | Admitting: Physician Assistant

## 2020-03-17 VITALS — BP 122/69 | HR 60

## 2020-03-17 DIAGNOSIS — J441 Chronic obstructive pulmonary disease with (acute) exacerbation: Secondary | ICD-10-CM | POA: Diagnosis not present

## 2020-03-17 MED ORDER — DOXYCYCLINE HYCLATE 100 MG PO TABS: 100.0000 mg | ORAL_TABLET | Freq: Two times a day (BID) | ORAL | 0 refills | Status: AC

## 2020-03-17 MED ORDER — PREDNISONE 20 MG PO TABS: 20.0000 mg | ORAL_TABLET | Freq: Two times a day (BID) | ORAL | 0 refills | Status: AC

## 2020-03-17 NOTE — Progress Notes (Signed)
Patient: James [name] Male    DOB: Nov 22, 1929   86 y.o.   MRN: [id number] Visit Date: 03/17/2020  Today's Provider: Trinna Post, PA-C   Chief Complaint  Patient presents with  . COPD  . Cough   Subjective:   ,I, James [name],CMA am acting as a Education administrator for [employer].  Virtual Visit via Telephone Note  I connected with James [name] on 03/17/20 at  9:40 AM EDT by telephone and verified that I am speaking with the correct person using two identifiers.  Location: Patient: Home Provider: Office   I discussed the limitations, risks, security and privacy concerns of performing an evaluation and management service by telephone and the availability of in person appointments. I also discussed with the patient that there may be a patient responsible charge related to this service. The patient expressed understanding and agreed to proceed.  He has history of COPD, CVA, HTN reports new productive cough with increased wheezing. He denies fevers and chills. He is followed by pulmonology. He has been vaccinated for COVID, second dose 02/06/2020. Denies sick contacts.   COPD He complains of chest tightness, cough, difficulty breathing, shortness of breath and wheezing. This is a recurrent problem. The current episode started 1 to 4 weeks ago. The problem occurs constantly. The problem has been gradually worsening. The cough is barking, hacking and productive. Pertinent negatives include no fever, nasal congestion, postnasal drip, rhinorrhea, sneezing or sore throat. His symptoms are aggravated by any activity, climbing stairs, exercise and lying down. His symptoms are alleviated by oral steroids. He reports moderate improvement on treatment. His symptoms are not alleviated by beta-agonist, OTC cough suppressant, ipratropium, rest and oral steroids. His past medical history is significant for asthma, bronchitis, COPD, emphysema and pneumonia.  Cough This is a recurrent problem. The  current episode started 1 to 4 weeks ago. The problem has been gradually worsening. The cough is productive of sputum. Associated symptoms include shortness of breath and wheezing. Pertinent negatives include no chills, fever, nasal congestion, postnasal drip, rhinorrhea or sore throat. The symptoms are aggravated by exercise, lying down and other. He has tried a beta-agonist inhaler, oral steroids and OTC cough suppressant for the symptoms. The treatment provided mild relief. His past medical history is significant for asthma, bronchitis, COPD, emphysema and pneumonia.    Allergies  Allergen Reactions  . Indomethacin Nausea Only     Current Outpatient Medications:  .  albuterol (PROAIR HFA) 108 (90 Base) MCG/ACT inhaler, TAKE 2 PUFFS INTO LUNGS EVERY 6 HOURS ASNEEDED FOR WHEEZING OR SHORTNESS OF BREATH, Disp: 8.5 g, Rfl: 5 .  arformoterol (BROVANA) 15 MCG/2ML NEBU, Take 2 mLs (15 mcg total) by nebulization 2 (two) times daily. DX:J44.9, Disp: 120 mL, Rfl: 3 .  atorvastatin (LIPITOR) 80 MG tablet, Take 1 tablet (80 mg total) by mouth daily at 6 PM. Please schedule office visit before any future refills, Disp: 30 tablet, Rfl: 0 .  azelastine (OPTIVAR) 0.05 % ophthalmic solution, APPLY 1 DROP INTO THE EYE TWICE DAILY ASDIRECTED, Disp: 6 mL, Rfl: 5 .  benazepril-hydrochlorthiazide (LOTENSIN HCT) 20-12.5 MG tablet, TAKE ONE TABLET EVERY DAY, Disp: 90 tablet, Rfl: 4 .  beta carotene w/minerals (OCUVITE) tablet, Take 1 tablet by mouth daily., Disp: , Rfl:  .  budesonide (PULMICORT) 0.5 MG/2ML nebulizer solution, Take 2 mLs (0.5 mg total) by nebulization 2 (two) times daily. Dx:j44.9, Disp: 75 mL, Rfl: 12 .  ELIQUIS 5 MG TABS tablet,  TAKE 1 TABLET BY MOUTH TWICE DAILY, Disp: 180 tablet, Rfl: 4 .  famotidine (PEPCID) 20 MG tablet, Take 1 tablet by mouth 1 day or 1 dose., Disp: , Rfl:  .  fluticasone (FLONASE) 50 MCG/ACT nasal spray, Place 2 sprays into both nostrils 2 (two) times daily., Disp: 48 g, Rfl:  3 .  furosemide (LASIX) 40 MG tablet, Take 40 mg by mouth daily., Disp: , Rfl:  .  ipratropium-albuterol (DUONEB) 0.5-2.5 (3) MG/3ML SOLN, Take 3 mLs by nebulization 4 (four) times daily., Disp: , Rfl:  .  lansoprazole (PREVACID) 30 MG capsule, TAKE 1 CAPSULE EVERY DAY, Disp: 90 capsule, Rfl: 3 .  LORazepam (ATIVAN) 1 MG tablet, TAKE 1 TABLET BY MOUTH AT BEDTIME, Disp: 30 tablet, Rfl: 2 .  Magnesium 400 MG CAPS, Take 1 capsule by mouth daily. , Disp: , Rfl:  .  metoprolol tartrate (LOPRESSOR) 50 MG tablet, TAKE ONE TABLET TWICE DAILY, Disp: 60 tablet, Rfl: 0 .  montelukast (SINGULAIR) 10 MG tablet, Take 1 tablet (10 mg total) by mouth at bedtime., Disp: 90 tablet, Rfl: 3 .  MULTIPLE VITAMIN PO, Take 1 tablet by mouth daily. , Disp: , Rfl:  .  Omega-3 Fatty Acids (FISH OIL) 1000 MG CAPS, Take 1 capsule daily by mouth., Disp: , Rfl:  .  tamsulosin (FLOMAX) 0.4 MG CAPS capsule, Take 1 capsule (0.4 mg total) by mouth daily., Disp: 90 capsule, Rfl: 3  Review of Systems  Constitutional: Positive for fatigue. Negative for chills and fever.  HENT: Positive for congestion (mainly in pts chest per wife). Negative for postnasal drip, rhinorrhea, sinus pressure, sinus pain, sneezing and sore throat.   Respiratory: Positive for cough, chest tightness, shortness of breath and wheezing.   Gastrointestinal: Negative for diarrhea, nausea and vomiting.  Neurological: Positive for weakness (from the coughing per pt wife.).    Social History   Tobacco Use  . Smoking status: Former Smoker    Packs/day: 1.00    Years: 11.00    Pack years: 11.00    Types: Cigarettes    Quit date: 02/12/1984    Years since quitting: 36.1  . Smokeless tobacco: Never Used  . Tobacco comment: quit smoking 30 yrs ago  Substance Use Topics  . Alcohol use: Yes    Alcohol/week: 0.0 standard drinks    Comment: beer occasionally      Objective:   There were no vitals taken for this visit. There were no vitals filed for this  visit.There is no height or weight on file to calculate BMI.   Physical Exam Pulmonary:     Comments: Talking in complete sentences without pausing for breath. Voice sounds hoarse.      No results found for any visits on 03/17/20.     Assessment & Plan    1. COPD with acute exacerbation [facility])  Advised patient on how to get tested for COVID at [facility]. Treat as below for COPD exacerbation. Return precautions counseled.  - doxycycline (VIBRA-TABS) 100 MG tablet; Take 1 tablet (100 mg total) by mouth 2 (two) times daily for 7 days.  Dispense: 14 tablet; Refill: 0 - predniSONE (DELTASONE) 20 MG tablet; Take 1 tablet (20 mg total) by mouth 2 (two) times daily with a meal for 7 days.  Dispense: 14 tablet; Refill: 0  I discussed the assessment and treatment plan with the patient. The patient was provided an opportunity to ask questions and all were answered. The patient agreed with the plan and demonstrated  an understanding of the instructions.   The patient was advised to call back or seek an in-person evaluation if the symptoms worsen or if the condition fails to improve as anticipated.  I provided 25 minutes of non-face-to-face time during this encounter.  The entirety of the information documented in the History of Present Illness, Review of Systems and Physical Exam were personally obtained by me. Portions of this information were initially documented by [facility] and reviewed by me for thoroughness and accuracy.      Trinna Post, PA-C  [facility]

## 2020-03-18 ENCOUNTER — Other Ambulatory Visit: Payer: Self-pay | Admitting: Family Medicine

## 2020-03-18 DIAGNOSIS — J449 Chronic obstructive pulmonary disease, unspecified: Secondary | ICD-10-CM | POA: Diagnosis not present

## 2020-03-18 NOTE — Telephone Encounter (Signed)
Please advise. This patient was seen by Fabio Bering yesterday for COPD exacerbation.

## 2020-03-18 NOTE — Telephone Encounter (Signed)
Requested medication (s) are due for refill today: yes  Requested medication (s) are on the active medication list: no  Last refill:  ?  Future visit scheduled: no  Notes to clinic: no assigned protocol   Requested Prescriptions  Pending Prescriptions Disp Refills   chlorpheniramine-HYDROcodone ([place]) 10-8 MG/5ML [place] [Pharmacy Med Name: HYDROCOD POLST-CPM POLST ER 10-8 MG] 115 mL     Sig: TAKE 1 TEASPOONFUL EVERY 12 HOURS AS NEEDED FOR COUGH      Off-Protocol Failed - 03/18/2020 10:15 AM      Failed - Medication not assigned to a protocol, review manually.      Passed - Valid encounter within last 12 months    Recent Outpatient Visits           Yesterday COPD with acute exacerbation [facility])   [place], [place], [place]   4 months ago Cough   [facility] Birdie Sons, [facility]   7 months ago COPD exacerbation [facility])   [facility] Bird[facility], [facility]   1 year ago Chest wall pain   [facility] Birdie Sons, MD   1 year ago Bursitis of other bursa of left hip   [facility] Birdie Sons, MD

## 2020-03-30 DIAGNOSIS — J449 Chronic obstructive pulmonary disease, unspecified: Secondary | ICD-10-CM | POA: Diagnosis not present

## 2020-04-05 DIAGNOSIS — I10 Essential (primary) hypertension: Secondary | ICD-10-CM | POA: Diagnosis not present

## 2020-04-05 DIAGNOSIS — J42 Unspecified chronic bronchitis: Secondary | ICD-10-CM | POA: Diagnosis not present

## 2020-04-05 DIAGNOSIS — I480 Paroxysmal atrial fibrillation: Secondary | ICD-10-CM | POA: Diagnosis not present

## 2020-04-05 DIAGNOSIS — I2510 Atherosclerotic heart disease of native coronary artery without angina pectoris: Secondary | ICD-10-CM | POA: Diagnosis not present

## 2020-04-06 DIAGNOSIS — J449 Chronic obstructive pulmonary disease, unspecified: Secondary | ICD-10-CM | POA: Diagnosis not present

## 2020-04-13 ENCOUNTER — Other Ambulatory Visit: Payer: Self-pay | Admitting: Family Medicine

## 2020-04-13 NOTE — Telephone Encounter (Signed)
Requested medication (s) are due for refill today -yes  Requested medication (s) are on the active medication list -yes  Future visit scheduled -no  Last refill: 01/15/20 2 RF  Notes to clinic: Request for non delegated Rx  Requested Prescriptions  Pending Prescriptions Disp Refills   LORazepam (ATIVAN) 1 MG tablet [Pharmacy Med Name: LORAZEPAM 1 MG TAB] 30 tablet     Sig: TAKE 1 TABLET BY MOUTH AT BEDTIME      Not Delegated - Psychiatry:  Anxiolytics/Hypnotics Failed - 04/13/2020 12:00 PM      Failed - This refill cannot be delegated      Failed - Urine Drug Screen completed in last 360 days.      Passed - Valid encounter within last 6 months    Recent Outpatient Visits           3 weeks ago COPD with acute exacerbation [facility])   [facility], Fabio Bering M, [place]   5 months ago Cough   [facility] Birdie Sons, [facility]   8 months ago COPD exacerbation [facility])   [facility] Birdie Sons, MD   1 year ago Chest wall pain   [facility] Birdie Sons, MD   1 year ago Bursitis of other bursa of left hip   [place], [place]                  Requested Prescriptions  Pending Prescriptions Disp Refills   LORazepam (ATIVAN) 1 MG tablet [Pharmacy Med Name: LORAZEPAM 1 MG TAB] 30 tablet     Sig: TAKE 1 TABLET BY MOUTH AT BEDTIME      Not Delegated - Psychiatry:  Anxiolytics/Hypnotics Failed - 04/13/2020 12:00 PM      Failed - This refill cannot be delegated      Failed - Urine Drug Screen completed in last 360 days.      Passed - Valid encounter within last 6 months    Recent Outpatient Visits           3 weeks ago COPD with acute exacerbation [facility])   [place], [place], [place]   5 months ago Cough   [facility] Birdie Sons, MD   8 months ago COPD exacerbation [facility])   [facility] Birdie Sons, MD   1 year ago  Chest wall pain   [facility] Birdie Sons, MD   1 year ago Bursitis of other bursa of left hip   [facility]

## 2020-04-15 DIAGNOSIS — J449 Chronic obstructive pulmonary disease, unspecified: Secondary | ICD-10-CM | POA: Diagnosis not present

## 2020-04-17 DIAGNOSIS — J449 Chronic obstructive pulmonary disease, unspecified: Secondary | ICD-10-CM | POA: Diagnosis not present

## 2020-04-26 ENCOUNTER — Other Ambulatory Visit: Payer: Self-pay | Admitting: Family Medicine

## 2020-04-26 DIAGNOSIS — N400 Enlarged prostate without lower urinary tract symptoms: Secondary | ICD-10-CM

## 2020-04-26 NOTE — Telephone Encounter (Signed)
Requested medication (s) are due for refill today: yes  Requested medication (s) are on the active medication list: yes  Last refill:  02/09/2020  Future visit scheduled: no  Notes to clinic:  patient  has been given courtesy refill and has not schedule appointment    Requested Prescriptions  Pending Prescriptions Disp Refills   atorvastatin (LIPITOR) 80 MG tablet [Pharmacy Med Name: ATORVASTATIN 80MG  TABLETS] 30 tablet 0    Sig: TAKE 1 TABLET BY MOUTH DAILY AT 6PM, PLEASE SCHEDULE OFFICE VISIT BEFORE ANY FUTURE REFILLS.      Cardiovascular:  Antilipid - Statins Failed - 04/26/2020  8:53 AM      Failed - Total Cholesterol in normal range and within 360 days    Cholesterol  Date Value Ref Range Status  12/03/2018 99 0 - 200 mg/dL Final          Failed - LDL in normal range and within 360 days    LDL Cholesterol  Date Value Ref Range Status  12/03/2018 34 0 - 99 mg/dL Final    Comment:           Total Cholesterol/HDL:CHD Risk Coronary Heart Disease Risk Table                     Men   Women  1/2 Average Risk   3.4   3.3  Average Risk       5.0   4.4  2 X Average Risk   9.6   7.1  3 X Average Risk  23.4   11.0        Use the calculated Patient Ratio above and the CHD Risk Table to determine the patient's CHD Risk.        ATP III CLASSIFICATION (LDL):  <100     mg/dL   Optimal  100-129  mg/dL   Near or Above                    Optimal  130-159  mg/dL   Borderline  160-189  mg/dL   High  >190     mg/dL   Very High Performed at [facility], [address] [place], [place] [address]           Failed - HDL in normal range and within 360 days    HDL  Date Value Ref Range Status  12/03/2018 55 >40 mg/dL Final          Failed - Triglycerides in normal range and within 360 days    Triglycerides  Date Value Ref Range Status  12/03/2018 51 <150 mg/dL Final          Passed - Patient is not pregnant      Passed - Valid encounter within last 12 months     Recent Outpatient Visits           1 month ago COPD with acute exacerbation [facility])   [facility] [place], [place], [place]   6 months ago Cough   [facility] Birdie Sons, [facility]   9 months ago COPD exacerbation [facility])   [facility] Birdie Sons, MD   1 year ago Chest wall pain   [facility] Birdie Sons, MD   1 year ago Bursitis of other bursa of left hip   [facility] Birdie Sons, MD  tamsulosin (FLOMAX) 0.4 MG CAPS capsule [Pharmacy Med Name: TAMSULOSIN 0.4MG  CAPSULES] 90 capsule 3    Sig: TAKE ONE CAPSULE BY MOUTH DAILY      Urology: Alpha-Adrenergic Blocker Passed - 04/26/2020  8:53 AM      Passed - Last BP in normal range    BP Readings from Last 1 Encounters:  03/17/20 122/69          Passed - Valid encounter within last 12 months    Recent Outpatient Visits           1 month ago COPD with acute exacerbation [facility])   [place], [place], [place]   6 months ago Cough   [facility] Birdie Sons, MD   9 months ago COPD exacerbation [facility])   [facility] Birdie Sons, MD   1 year ago Chest wall pain   [facility] Birdie Sons, MD   1 year ago Bursitis of other bursa of left hip   [facility] Birdie Sons, MD

## 2020-05-06 DIAGNOSIS — J449 Chronic obstructive pulmonary disease, unspecified: Secondary | ICD-10-CM | POA: Diagnosis not present

## 2020-05-18 DIAGNOSIS — J449 Chronic obstructive pulmonary disease, unspecified: Secondary | ICD-10-CM | POA: Diagnosis not present

## 2020-05-26 ENCOUNTER — Other Ambulatory Visit: Payer: Self-pay | Admitting: Family Medicine

## 2020-05-26 NOTE — Telephone Encounter (Signed)
Requested medication (s) are due for refill today -yes  Requested medication (s) are on the active medication list -yes  Future visit scheduled -no  Last refill: 04/26/20  Notes to clinic: Patient is requesting refill that fails lab protocol- sent for review  Requested Prescriptions  Pending Prescriptions Disp Refills   ELIQUIS 5 MG TABS tablet [Pharmacy Med Name: ELIQUIS 5 MG TAB] 180 tablet 4    Sig: TAKE 1 TABLET BY MOUTH TWICE DAILY      Hematology:  Anticoagulants Failed - 05/26/2020  2:19 PM      Failed - HGB in normal range and within 360 days    Hemoglobin  Date Value Ref Range Status  02/18/2020 11.9 (L) 13.0 - 17.0 g/dL Final  12/12/2018 12.5 (L) 13.0 - 17.7 g/dL Final          Failed - PLT in normal range and within 360 days    Platelets  Date Value Ref Range Status  02/18/2020 147 (L) 150 - 400 K/uL Final  12/12/2018 180 150 - 450 x10E3/uL Final          Failed - HCT in normal range and within 360 days    HCT  Date Value Ref Range Status  02/18/2020 37.4 (L) 39.0 - 52.0 % Final   Hematocrit  Date Value Ref Range Status  12/12/2018 37.1 (L) 37.5 - 51.0 % Final          Failed - Cr in normal range and within 360 days    Creatinine  Date Value Ref Range Status  06/04/2013 1.12 0.60 - 1.30 mg/dL Final   Creatinine, Ser  Date Value Ref Range Status  02/18/2020 1.30 (H) 0.61 - 1.24 mg/dL Final          Passed - Valid encounter within last 12 months    Recent Outpatient Visits           2 months ago COPD with acute exacerbation [facility])   [place], Adriana M, PA-C   7 months ago Cough   [facility] Birdie Sons, MD   10 months ago COPD exacerbation [facility])   [facility] Birdie Sons, MD   1 year ago Chest wall pain   [facility] Birdie Sons, MD   1 year ago Bursitis of other bursa of left hip   [facility] Birdie Sons, MD                   Requested Prescriptions  Pending Prescriptions Disp Refills   ELIQUIS 5 MG TABS tablet [Pharmacy Med Name: ELIQUIS 5 MG TAB] 180 tablet 4    Sig: TAKE 1 TABLET BY MOUTH TWICE DAILY      Hematology:  Anticoagulants Failed - 05/26/2020  2:19 PM      Failed - HGB in normal range and within 360 days    Hemoglobin  Date Value Ref Range Status  02/18/2020 11.9 (L) 13.0 - 17.0 g/dL Final  12/12/2018 12.5 (L) 13.0 - 17.7 g/dL Final          Failed - PLT in normal range and within 360 days    Platelets  Date Value Ref Range Status  02/18/2020 147 (L) 150 - 400 K/uL Final  12/12/2018 180 150 - 450 x10E3/uL Final          Failed - HCT in normal range and within 360 days    HCT  Date Value Ref Range Status  02/18/2020 37.4 (L) 39.0 - 52.0 % Final   Hematocrit  Date Value Ref Range Status  12/12/2018 37.1 (L) 37.5 - 51.0 % Final          Failed - Cr in normal range and within 360 days    Creatinine  Date Value Ref Range Status  06/04/2013 1.12 0.60 - 1.30 mg/dL Final   Creatinine, Ser  Date Value Ref Range Status  02/18/2020 1.30 (H) 0.61 - 1.24 mg/dL Final          Passed - Valid encounter within last 12 months    Recent Outpatient Visits           2 months ago COPD with acute exacerbation [facility])   [place], [place], [place]   7 months ago Cough   [facility] Birdie Sons, MD   10 months ago COPD exacerbation [facility])   [facility] Birdie Sons, MD   1 year ago Chest wall pain   [facility] Birdie Sons, MD   1 year ago Bursitis of other bursa of left hip   [facility] Birdie Sons, MD

## 2020-05-27 DIAGNOSIS — R0600 Dyspnea, unspecified: Secondary | ICD-10-CM | POA: Diagnosis not present

## 2020-06-11 NOTE — Progress Notes (Signed)
I,Roshena L Chambers,acting as a scribe for Lelon Huh, MD.,have documented all relevant documentation on the behalf of Lelon Huh, MD,as directed by  Lelon Huh, MD while in the presence of Lelon Huh, MD.   Established patient visit   Patient: James [name]   DOB: Sep 11, 1929   86 y.o. Male  MRN: [id number] Visit Date: 06/14/2020  Today's healthcare provider: Lelon Huh, MD   Chief Complaint  Patient presents with  . Hypertension  . COPD  . Hyperlipidemia     Subjective    HPI Hypertension, follow-up  BP Readings from Last 3 Encounters:  06/14/20 (!) 112/52  03/17/20 122/69  02/18/20 (!) 108/56   Wt Readings from Last 3 Encounters:  06/14/20 170 lb (77.1 kg)  02/18/20 175 lb (79.4 kg)  01/30/20 177 lb 3.2 oz (80.4 kg)     He was last seen for hypertension over 1 years ago.  Management since that visit includes no changes.  He reports fair compliance with treatment. Patient is not currently taking Benazepril- HCTZ due to low blood pressure readings. He is still taking Furosemide 40mg  daily. He is not having side effects.  He is following a Regular diet. He is not exercising. He does not smoke.  Use of agents associated with hypertension: none.   Outside blood pressures are 120/60. Symptoms: No chest pain No chest pressure  No palpitations No syncope  No dyspnea No orthopnea  No paroxysmal nocturnal dyspnea No lower extremity edema   Pertinent labs: Lab Results  Component Value Date   CHOL 99 12/03/2018   HDL 55 12/03/2018   LDLCALC 34 12/03/2018   TRIG 51 12/03/2018   CHOLHDL 1.8 12/03/2018   Lab Results  Component Value Date   NA 142 02/18/2020   K 4.7 02/18/2020   CREATININE 1.30 (H) 02/18/2020   GFRNONAA 49 (L) 02/18/2020   GFRAA 56 (L) 02/18/2020   GLUCOSE 113 (H) 02/18/2020     The ASCVD Risk score (Goff DC Jr., et al., 2013) failed to calculate for the following reasons:   The 2013 ASCVD risk score is only valid for  ages 24 to 37   The patient has a prior MI or stroke diagnosis   --------------------------------------------------------------------------------------------------- Lipid/Cholesterol, Follow-up  Other pertinent labs  Lab Results  Component Value Date   ALT 24 02/18/2020   AST 28 02/18/2020   PLT 147 (L) 02/18/2020   TSH 0.673 07/13/2019     He was last seen for this over 1 years ago.  Management since that visit includes no change.  He reports good compliance with treatment. He is not having side effects.   Symptoms: No chest pain No chest pressure/discomfort  No dyspnea No lower extremity edema  No numbness or tingling of extremity No orthopnea  No palpitations No paroxysmal nocturnal dyspnea  No speech difficulty No syncope   Current diet: well balanced Current exercise: none  The ASCVD Risk score ([place]., et al., 2013) failed to calculate for the following reasons:   The 2013 ASCVD risk score is only valid for ages 81 to 70   The patient has a prior MI or stroke diagnosis  --------------------------------------------------------------------------------------------------- COPD, Follow up  He was last seen for this 3 months ago. Changes made include started Doxycycline and Prednisone.   He reports good compliance with treatment. He is not having side effects. Patient has been following up with [facility] pulmonology, and is now being followed by Dr. Lanney Gins. He did  have CMP last week which was normal. He also had CBC and BNP in March which were normal. He has follow up with Dr. Ubaldo Glassing, cardiology in October. [name]  He IS experiencing dyspnea. he reports breathing is Unchanged.  Pulmonary Functions Testing Results:    -----------------------------------------------------------------------------------------    Medications: Outpatient Medications Prior to Visit  Medication Sig  . atorvastatin (LIPITOR) 80 MG tablet TAKE 1 TABLET BY MOUTH DAILY AT 6PM,  PLEASE SCHEDULE OFFICE VISIT BEFORE ANY FUTURE REFILLS.  [name] azelastine (OPTIVAR) 0.05 % ophthalmic solution APPLY 1 DROP INTO THE EYE TWICE DAILY ASDIRECTED  . beta carotene w/minerals (OCUVITE) tablet Take 1 tablet by mouth daily.  . budesonide (PULMICORT) 0.5 MG/2ML nebulizer solution Take 2 mLs (0.5 mg total) by nebulization 2 (two) times daily. Dx:j44.9  . chlorpheniramine-HYDROcodone (TUSSIONEX) 10-8 MG/5ML SUER TAKE 1 TEASPOONFUL EVERY 12 HOURS AS NEEDED FOR COUGH  . ELIQUIS 5 MG TABS tablet TAKE 1 TABLET BY MOUTH TWICE DAILY  . famotidine (PEPCID) 20 MG tablet Take 1 tablet by mouth 1 day or 1 dose.  . fluticasone (FLONASE) 50 MCG/ACT nasal spray Place 2 sprays into both nostrils 2 (two) times daily.  . furosemide (LASIX) 40 MG tablet Take 40 mg by mouth daily.  [name] ipratropium-albuterol (DUONEB) 0.5-2.5 (3) MG/3ML SOLN Take 3 mLs by nebulization 4 (four) times daily.  . lansoprazole (PREVACID) 30 MG capsule TAKE 1 CAPSULE BY MOUTH EVERY DAY  . LORazepam (ATIVAN) 1 MG tablet Take 1 tablet (1 mg total) by mouth at bedtime.  . Magnesium 400 MG CAPS Take 1 capsule by mouth daily.   . metoprolol tartrate (LOPRESSOR) 50 MG tablet TAKE ONE TABLET TWICE DAILY  . montelukast (SINGULAIR) 10 MG tablet TAKE 1 TABLET BY MOUTH AT BEDTIME  . MULTIPLE VITAMIN PO Take 1 tablet by mouth daily.   . Omega-3 Fatty Acids (FISH OIL) 1000 MG CAPS Take 1 capsule daily by mouth.  . predniSONE (DELTASONE) 5 MG tablet Take 5 mg by mouth daily with breakfast.  . tamsulosin (FLOMAX) 0.4 MG CAPS capsule TAKE ONE CAPSULE BY MOUTH DAILY  . benazepril-hydrochlorthiazide (LOTENSIN HCT) 20-12.5 MG tablet TAKE ONE TABLET EVERY DAY (Patient not taking: Reported on 06/14/2020)  . [DISCONTINUED] albuterol (PROAIR HFA) 108 (90 Base) MCG/ACT inhaler TAKE 2 PUFFS INTO LUNGS EVERY 6 HOURS ASNEEDED FOR WHEEZING OR SHORTNESS OF BREATH (Patient not taking: Reported on 06/14/2020)  . [DISCONTINUED] arformoterol (BROVANA) 15 MCG/2ML NEBU  Take 2 mLs (15 mcg total) by nebulization 2 (two) times daily. DX:J44.9 (Patient not taking: Reported on 06/14/2020)   No facility-administered medications prior to visit.    Review of Systems  Constitutional: Negative for appetite change, chills and fever.  Respiratory: Negative for chest tightness, shortness of breath and wheezing.   Cardiovascular: Negative for chest pain and palpitations.  Gastrointestinal: Negative for abdominal pain, nausea and vomiting.      Objective    BP (!) 112/52 (BP Location: Left Arm, Patient Position: Sitting, Cuff Size: Normal)   Pulse 94   Temp (!) 97.5 F (36.4 C) (Temporal)   Resp 16   Wt 170 lb (77.1 kg)   SpO2 97% Comment: room air  BMI 25.85 kg/m    Physical Exam   General: Appearance:     Overweight male in no acute distress  Eyes:    PERRL, conjunctiva/corneas clear, EOM's intact       Lungs:     Clear to auscultation bilaterally, respirations unlabored  Heart:    Normal heart  rate. Irregularly irregular rhythm. No murmurs, rubs, or gallops.   MS:   All extremities are intact.   Neurologic:   Awake, alert, oriented x 3. No apparent focal neurological           defect.        Assessment & Plan     1. Chronic systolic heart failure (HCC) Stable on current meds. Has been off benazepril/hctz since he was started on furosemide, but he anticipates going back on it when he finishes furosemide.   2. Paroxysmal atrial fibrillation (HCC) Rate well controlled. On NOAC and tolerating well.   3. Chronic obstructive pulmonary disease, unspecified COPD type (HCC) Stable on current inhalers, followed by Dr. Lanney Gins.   4. Gastroesophageal reflux disease, unspecified whether esophagitis present Symptoms very well controlled with esomeprazole.   5. Hyperlipidemia, unspecified hyperlipidemia type He is tolerating atorvastatin well with no adverse effects.     Follow up 6 months.       The entirety of the information documented in the  History of Present Illness, Review of Systems and Physical Exam were personally obtained by me. Portions of this information were initially documented by the CMA and reviewed by me for thoroughness and accuracy.      Lelon Huh, MD  [facility] [phone] (phone) [phone] (fax)  [place]

## 2020-06-14 ENCOUNTER — Encounter: Payer: Self-pay | Admitting: Family Medicine

## 2020-06-14 ENCOUNTER — Ambulatory Visit: Payer: Medicare Other | Admitting: Family Medicine

## 2020-06-14 ENCOUNTER — Other Ambulatory Visit: Payer: Self-pay

## 2020-06-14 ENCOUNTER — Ambulatory Visit (INDEPENDENT_AMBULATORY_CARE_PROVIDER_SITE_OTHER): Payer: Medicare Other | Admitting: Family Medicine

## 2020-06-14 VITALS — BP 112/52 | HR 94 | Temp 97.5°F | Resp 16 | Wt 170.0 lb

## 2020-06-14 DIAGNOSIS — J449 Chronic obstructive pulmonary disease, unspecified: Secondary | ICD-10-CM | POA: Diagnosis not present

## 2020-06-14 DIAGNOSIS — K219 Gastro-esophageal reflux disease without esophagitis: Secondary | ICD-10-CM

## 2020-06-14 DIAGNOSIS — I480 Paroxysmal atrial fibrillation: Secondary | ICD-10-CM | POA: Diagnosis not present

## 2020-06-14 DIAGNOSIS — I5022 Chronic systolic (congestive) heart failure: Secondary | ICD-10-CM | POA: Diagnosis not present

## 2020-06-14 DIAGNOSIS — E785 Hyperlipidemia, unspecified: Secondary | ICD-10-CM

## 2020-06-14 NOTE — Patient Instructions (Signed)
.   Please review the attached list of medications and notify my office if there are any errors.   The CDC recommends two doses of Shingrix (the shingles vaccine) separated by 2 to 6 months for adults age 84 years and older. I recommend checking with your pharmacy plan regarding coverage for this vaccine.  .    

## 2020-06-17 DIAGNOSIS — J449 Chronic obstructive pulmonary disease, unspecified: Secondary | ICD-10-CM | POA: Diagnosis not present

## 2020-07-05 DIAGNOSIS — H524 Presbyopia: Secondary | ICD-10-CM | POA: Diagnosis not present

## 2020-07-08 NOTE — Progress Notes (Signed)
Subjective:   James [name] is a 86 y.o. male who presents for an Initial Medicare Annual Wellness Visit.  I connected with James [name] today by telephone and verified that I am speaking with the correct person using two identifiers. Location patient: home Location provider: work Persons participating in the virtual visit: patient, provider.   I discussed the limitations, risks, security and privacy concerns of performing an evaluation and management service by telephone and the availability of in person appointments. I also discussed with the patient that there may be a patient responsible charge related to this service. The patient expressed understanding and verbally consented to this telephonic visit.    Interactive audio and video telecommunications were attempted between this provider and patient, however failed, due to patient having technical difficulties OR patient did not have access to video capability.  We continued and completed visit with audio only.   Review of Systems    N/A  Cardiac Risk Factors include: advanced age (>64mn, >>72women);dyslipidemia;male gender;hypertension     Objective:    There were no vitals filed for this visit. There is no height or weight on file to calculate BMI.  Advanced Directives 07/12/2020 02/18/2020 01/30/2020 07/29/2019 07/13/2019 07/13/2019 07/13/2019  Does Patient Have a Medical Advance Directive? _0  No Yes  Type of AParamedicof ACalzadaLiving will HHuntertownLiving will HHum[place]Living will Living will;Healthcare Power of ADelavanLiving will - HMovicoLiving will  Does patient want to make changes to medical advance directive? - No - Patient declined No - Patient declined No - Patient declined - No - Patient declined No - Patient declined  Copy of H[place]ormstownin Chart? Yes - validated most recent  copy scanned in chart (See row information) No - copy requested No - copy requested Yes - validated most recent copy scanned in chart (See row information) Yes - validated most recent copy scanned in chart (See row information) - No - copy requested  Would patient like information on creating a medical advance directive? - - No - Patient declined No - Patient declined - - -    Current Medications (verified) Outpatient Encounter Medications as of 07/12/2020  Medication Sig  . atorvastatin (LIPITOR) 80 MG tablet TAKE 1 TABLET BY MOUTH DAILY AT 6PM, PLEASE SCHEDULE OFFICE VISIT BEFORE ANY FUTURE REFILLS.  .Marland Kitchenazelastine (OPTIVAR) 0.05 % ophthalmic solution APPLY 1 DROP INTO THE EYE TWICE DAILY ASDIRECTED  . beta carotene w/minerals (OCUVITE) tablet Take 1 tablet by mouth daily.  . budesonide (PULMICORT) 0.5 MG/2ML nebulizer solution Take 2 mLs (0.5 mg total) by nebulization 2 (two) times daily. Dx:j44.9  . chlorpheniramine-HYDROcodone (TUSSIONEX) 10-8 MG/5ML SUER TAKE 1 TEASPOONFUL EVERY 12 HOURS AS NEEDED FOR COUGH  . ELIQUIS 5 MG TABS tablet TAKE 1 TABLET BY MOUTH TWICE DAILY  . famotidine (PEPCID) 20 MG tablet Take 1 tablet by mouth 1 day or 1 dose.  . fluticasone (FLONASE) 50 MCG/ACT nasal spray Place 2 sprays into both nostrils 2 (two) times daily.  . furosemide (LASIX) 40 MG tablet Take 40 mg by mouth daily.  . lansoprazole (PREVACID) 30 MG capsule TAKE 1 CAPSULE BY MOUTH EVERY DAY  . LORazepam (ATIVAN) 1 MG tablet Take 1 tablet (1 mg total) by mouth at bedtime.  . Magnesium 400 MG CAPS Take 1 capsule by mouth daily.   . metoprolol tartrate (LOPRESSOR) 50 MG tablet TAKE ONE  TABLET TWICE DAILY  . montelukast (SINGULAIR) 10 MG tablet TAKE 1 TABLET BY MOUTH AT BEDTIME  . MULTIPLE VITAMIN PO Take 1 tablet by mouth daily.   . Omega-3 Fatty Acids (FISH OIL) 1000 MG CAPS Take 1 capsule daily by mouth.  . predniSONE (DELTASONE) 5 MG tablet Take 5 mg by mouth daily with breakfast.  . tamsulosin  (FLOMAX) 0.4 MG CAPS capsule TAKE ONE CAPSULE BY MOUTH DAILY  . ipratropium-albuterol (DUONEB) 0.5-2.5 (3) MG/3ML SOLN Take 3 mLs by nebulization 4 (four) times daily. (Patient not taking: Reported on 07/12/2020)   No facility-administered encounter medications on file as of 07/12/2020.    Allergies (verified) Indomethacin   History: Past Medical History:  Diagnosis Date  . Arthritis   . Back pain   . CAD (coronary artery disease)   . CAP (community acquired pneumonia) 11/06/2017  . Cataract    right  . COPD (chronic obstructive pulmonary disease) ([place])    SPiriva and SYmbicort daily. Albuterol as needed  . Diverticulosis   . Dyspnea    with exertion  . Enlarged prostate    takes Flomax daily  . GERD (gastroesophageal reflux disease)   . History of chicken pox   . History of gout   . History of measles   . History of mumps   . HOH (hard of hearing)   . Hyperglycemia   . Hyperlipidemia    takes Atorvastatin daily  . Hypertension    takes Metoprolol daily as well as Lotensin HCT  . Hypotension   . Joint pain   . Microscopic colitis   . PAF (paroxysmal atrial fibrillation) ([place]), RVR 03/14/2017  . Pneumonia   . Prostate cancer ([place])   . Stroke [facility])    TIA  . TIA (transient ischemic attack)   . Weakness    numbness and tingling.mainly on right   Past Surgical History:  Procedure Laterality Date  . ANTERIOR CERVICAL DECOMP/DISCECTOMY FUSION N/A 03/07/2017   Procedure: ANTERIOR CERVICAL DECOMPRESSION/DISCECTOMY FUSION CERVICAL THREE- CERVICAL FOUR, CERVICAL FOUR- CERVICAL FIVE;  Surgeon: Newman Pies, MD;  Location: [facility];  Service: Neurosurgery;  Laterality: N/A;  ANTERIOR CERVICAL DECOMPRESSION/DISCECTOMY FUSION CERVICAL 3- CERVICAL 4, CERVIACL 4- CERVICAL 5  . APPENDECTOMY  1950  . CARDIAC CATHETERIZATION  05/29/2013   EF=40-45%. Moderate pulmonary hypertension. Infero/ lateral hypokinesis  . cataract surgery Left   . COLONOSCOPY    . CORONARY ARTERY BYPASS GRAFT   2011   x 5  . ESOPHAGOGASTRODUODENOSCOPY (EGD) WITH PROPOFOL N/A 07/01/2018   Procedure: ESOPHAGOGASTRODUODENOSCOPY (EGD) WITH PROPOFOL;  Surgeon: Lin Landsman, MD;  Location: [facility];  Service: Gastroenterology;  Laterality: N/A;  . EYE SURGERY    . FLEXIBLE BRONCHOSCOPY N/A 11/19/2017   Procedure: FLEXIBLE BRONCHOSCOPY;  Surgeon: Laverle Hobby, MD;  Location: [facility];  Service: Pulmonary;  Laterality: N/A;  . MRI BRAIN  06/07/2013   Mild chronic involutional changes. No acute abnormalities  . MRI of neck    . Myocardial Perfusion Scan  05/29/2013   Abnormal myocardial perfusion image consistent with myocardial infarction  . PROSTATE SURGERY  11/18/2012   radiation seed  . TOTAL HIP ARTHROPLASTY Left 1997   hip joint replacement   Family History  Problem Relation Age of Onset  . Heart attack Mother   . Stroke Father   . Heart attack Father   . Hypertension Father   . Heart attack Sister   . Bladder Cancer Brother   . Kidney cancer Brother   . Heart  Problems Brother    Social History   Socioeconomic History  . Marital status: Married    Spouse name: Not on file  . Number of children: 1  . Years of education: Not on file  . Highest education level: Some college, no degree  Occupational History  . Occupation: Retired  Tobacco Use  . Smoking status: Former Smoker    Packs/day: 1.00    Years: 11.00    Pack years: 11.00    Types: Cigarettes    Quit date: 02/12/1984    Years since quitting: 36.4  . Smokeless tobacco: Never Used  . Tobacco comment: quit smoking 30 yrs ago  Vaping Use  . Vaping Use: Never used  Substance and Sexual Activity  . Alcohol use: Yes    Alcohol/week: 1.0 - 2.0 standard drink    Types: 1 - 2 Cans of beer per week  . Drug use: No  . Sexual activity: Not on file  Other Topics Concern  . Not on file  Social History Narrative   Lives in [place], [place].   Social Determinants of Health   Financial Resource Strain: Low  Risk   . Difficulty of Paying Living Expenses: Not hard at all  Food Insecurity: No Food Insecurity  . Worried About Charity fundraiser in the Last Year: Never true  . Ran Out of Food in the Last Year: Never true  Transportation Needs: No Transportation Needs  . Lack of Transportation (Medical): No  . Lack of Transportation (Non-Medical): No  Physical Activity: Insufficiently Active  . Days of Exercise per Week: 7 days  . Minutes of Exercise per Session: 10 min  Stress: No Stress Concern Present  . Feeling of Stress : Not at all  Social Connections: Socially Integrated  . Frequency of Communication with Friends and Family: Three times a week  . Frequency of Social Gatherings with Friends and Family: Three times a week  . Attends Religious Services: More than 4 times per year  . Active Member of Clubs or Organizations: Yes  . Attends Archivist Meetings: More than 4 times per year  . Marital Status: Married    Tobacco Counseling Counseling given: Not Answered Comment: quit smoking 30 yrs ago   Clinical Intake:  Pre-visit preparation completed: Yes  Pain : No/denies pain     Nutritional Risks: None Diabetes: No  How often do you need to have someone help you when you read instructions, pamphlets, or other written materials from your doctor or pharmacy?: 1 - Never  Diabetic? No  Interpreter Needed?: No  Information entered by :: [facility], LPN   Activities of Daily Living In your present state of health, do you have any difficulty performing the following activities: 07/12/2020 07/13/2019  Hearing? N Y  Vision? N N  Difficulty concentrating or making decisions? N Y  Walking or climbing stairs? N N  Dressing or bathing? N N  Doing errands, shopping? N N  Preparing Food and eating ? N -  Using the Toilet? N -  In the past six months, have you accidently leaked urine? N -  Do you have problems with loss of bowel control? N -  Managing your Medications? N  -  Managing your Finances? N -  Housekeeping or managing your Housekeeping? N -  Some recent data might be hidden    Patient Care Team: Birdie Sons, MD as PCP - General (Family Medicine) Abbie Sons, MD (Urology) Teodoro Spray, MD as  Consulting Physician (Cardiology) Ottie Glazier, MD as Consulting Physician (Pulmonary Disease) [place], [place] any recent Medical Services you may have received from other than [facility] providers in the past year (date may be approximate).     Assessment:   This is a routine wellness examination for [place].  Hearing/Vision screen No exam data present  Dietary issues and exercise activities discussed: Current Exercise Habits: Home exercise routine, Type of exercise: Other - see comments (rides a stationary bike), Time (Minutes): 15, Frequency (Times/Week): 7, Weekly Exercise (Minutes/Week): 105, Intensity: Mild, Exercise limited by: None identified  Goals    . DIET - INCREASE WATER INTAKE     Recommend to drink at least 6-8 8oz glasses of water per day.      Depression Screen PHQ 2/9 Scores 07/12/2020 06/14/2020 09/24/2018 05/08/2018 02/13/2018 02/09/2017 10/24/2016  PHQ - 2 Score 0 0 0 0 4 0 0  PHQ- 9 Score - - 3 - _0 Fall Risk Fall Risk  07/12/2020 06/14/2020 09/24/2018 07/03/2018 05/08/2018  Falls in the past year? 0 0 No No No  Number falls in past yr: 0 0 - - -  Injury with Fall? 0 0 - - -  Risk for fall due to : - - Impaired vision - -  Risk for fall due to: Comment - - wears glasses - -  Follow up - Falls evaluation completed - - -    Any stairs in or around the home? Yes  If so, are there any without handrails? No  Home free of loose throw rugs in walkways, pet beds, electrical cords, etc? Yes  Adequate lighting in your home to reduce risk of falls? Yes   ASSISTIVE DEVICES UTILIZED TO PREVENT FALLS:  Life alert? No  Use of a cane, walker or w/c? No  Grab bars in the bathroom? No  Shower chair or  bench in shower? No  Elevated toilet seat or a handicapped toilet? No   Cognitive Function:     6CIT Screen 07/12/2020  What Year? 0 points  What month? 0 points  What time? 0 points  Count back from 20 0 points  Months in reverse 2 points  Repeat phrase 4 points  Total Score 6    Immunizations Immunization History  Administered Date(s) Administered  . Fluad Quad(high Dose 65+) 08/27/2019  . Influenza, High Dose Seasonal PF 09/16/2015, 09/28/2016, 09/11/2017, 09/03/2018  . PFIZER SARS-COV-2 Vaccination 01/16/2020, 02/06/2020  . Pneumococcal Conjugate-13 11/25/2014  . Pneumococcal Polysaccharide-23 09/17/2000  . Td 05/26/1995  . Tdap 09/06/2011  . Zoster 12/27/2007    TDAP status: Up to date Flu Vaccine status: Up to date Pneumococcal vaccine status: Up to date Covid-19 vaccine status: Completed vaccines  Qualifies for Shingles Vaccine? Yes   Zostavax completed Yes   Shingrix Completed?: No.    Education has been provided regarding the importance of this vaccine. Patient has been advised to call insurance company to determine out of pocket expense if they have not yet received this vaccine. Advised may also receive vaccine at local pharmacy or Health Dept. Verbalized acceptance and understanding.  Screening Tests Health Maintenance  Topic Date Due  . INFLUENZA VACCINE  07/18/2020  . TETANUS/TDAP  09/05/2021  . COVID-19 Vaccine  Completed  . PNA vac Low Risk Adult  Completed    Health Maintenance  There are no preventive care reminders to display for this patient.  Colorectal cancer screening: No longer required.   Lung  Cancer Screening: (Low Dose CT Chest recommended if Age 75-80 years, 30 pack-year currently smoking OR have quit w/in 15years.) does not qualify.    Additional Screening:  Vision Screening: Recommended annual ophthalmology exams for early detection of glaucoma and other disorders of the eye. Is the patient up to date with their annual eye exam?   Yes  Who is the provider or what is the name of the office in which the patient attends annual eye exams? [facility] If pt is not established with a provider, would they like to be referred to a provider to establish care? No .   Dental Screening: Recommended annual dental exams for proper oral hygiene  Community Resource Referral / Chronic Care Management: CRR required this visit?  No   CCM required this visit?  No      Plan:     I have personally reviewed and noted the following in the patient's chart:   . Medical and social history . Use of alcohol, tobacco or illicit drugs  . Current medications and supplements . Functional ability and status . Nutritional status . Physical activity . Advanced directives . List of other physicians . Hospitalizations, surgeries, and ER visits in previous 12 months . Vitals . Screenings to include cognitive, depression, and falls . Referrals and appointments  In addition, I have reviewed and discussed with patient certain preventive protocols, quality metrics, and best practice recommendations. A written personalized care plan for preventive services as well as general preventive health recommendations were provided to patient.     Devaris Quirk [place], [place]   [id number]/[id number]   Nurse Notes: None.

## 2020-07-12 ENCOUNTER — Other Ambulatory Visit: Payer: Self-pay

## 2020-07-12 ENCOUNTER — Ambulatory Visit (INDEPENDENT_AMBULATORY_CARE_PROVIDER_SITE_OTHER): Payer: Medicare Other

## 2020-07-12 DIAGNOSIS — N400 Enlarged prostate without lower urinary tract symptoms: Secondary | ICD-10-CM

## 2020-07-12 DIAGNOSIS — Z0000 Encounter for general adult medical examination without abnormal findings: Secondary | ICD-10-CM

## 2020-07-12 MED ORDER — TAMSULOSIN HCL 0.4 MG PO CAPS: 0.4000 mg | ORAL_CAPSULE | Freq: Every day | ORAL | 3 refills | Status: AC

## 2020-07-12 MED ORDER — ATORVASTATIN CALCIUM 80 MG PO TABS: ORAL_TABLET | ORAL | 3 refills | Status: DC

## 2020-07-12 NOTE — Patient Instructions (Signed)
James [name] , Thank you for taking time to come for your Medicare Wellness Visit. I appreciate your ongoing commitment to your health goals. Please review the following plan we discussed and let me know if I can assist you in the future.   Screening recommendations/referrals: Colonoscopy: No longer required.  Recommended yearly ophthalmology/optometry visit for glaucoma screening and checkup Recommended yearly dental visit for hygiene and checkup  Vaccinations: Influenza vaccine: Done 08/27/19 Pneumococcal vaccine: Completed series Tdap vaccine: Up to date, due 08/2021 Shingles vaccine: Due, declined at this time.     Advanced directives: Currently on file.  Conditions/risks identified: Recommend to drink at least 6-8 8oz glasses of water per day.  Next appointment: 12/14/20 @ 1:40 PM with Dr Caryn Section   Preventive Care 80 Years and Older, Male Preventive care refers to lifestyle choices and visits with your health care provider that can promote health and wellness. What does preventive care include?  A yearly physical exam. This is also called an annual well check.  Dental exams once or twice a year.  Routine eye exams. Ask your health care provider how often you should have your eyes checked.  Personal lifestyle choices, including:  Daily care of your teeth and gums.  Regular physical activity.  Eating a healthy diet.  Avoiding tobacco and drug use.  Limiting alcohol use.  Practicing safe sex.  Taking low doses of aspirin every day.  Taking vitamin and mineral supplements as recommended by your health care provider. What happens during an annual well check? The services and screenings done by your health care provider during your annual well check will depend on your age, overall health, lifestyle risk factors, and family history of disease. Counseling  Your health care provider may ask you questions about your:  Alcohol use.  Tobacco use.  Drug use.  Emotional  well-being.  Home and relationship well-being.  Sexual activity.  Eating habits.  History of falls.  Memory and ability to understand (cognition).  Work and work Statistician. Screening  You may have the following tests or measurements:  Height, weight, and BMI.  Blood pressure.  Lipid and cholesterol levels. These may be checked every 5 years, or more frequently if you are over 13 years old.  Skin check.  Lung cancer screening. You may have this screening every year starting at age 75 if you have a 30-pack-year history of smoking and currently smoke or have quit within the past 15 years.  Fecal occult blood test (FOBT) of the stool. You may have this test every year starting at age 31.  Flexible sigmoidoscopy or colonoscopy. You may have a sigmoidoscopy every 5 years or a colonoscopy every 10 years starting at age 36.  Prostate cancer screening. Recommendations will vary depending on your family history and other risks.  Hepatitis C blood test.  Hepatitis B blood test.  Sexually transmitted disease (STD) testing.  Diabetes screening. This is done by checking your blood sugar (glucose) after you have not eaten for a while (fasting). You may have this done every 1-3 years.  Abdominal aortic aneurysm (AAA) screening. You may need this if you are a current or former smoker.  Osteoporosis. You may be screened starting at age 42 if you are at high risk. Talk with your health care provider about your test results, treatment options, and if necessary, the need for more tests. Vaccines  Your health care provider may recommend certain vaccines, such as:  Influenza vaccine. This is recommended every year.  Tetanus,  diphtheria, and acellular pertussis (Tdap, Td) vaccine. You may need a Td booster every 10 years.  Zoster vaccine. You may need this after age 18.  Pneumococcal 13-valent conjugate (PCV13) vaccine. One dose is recommended after age 56.  Pneumococcal  polysaccharide (PPSV23) vaccine. One dose is recommended after age 36. Talk to your health care provider about which screenings and vaccines you need and how often you need them. This information is not intended to replace advice given to you by your health care provider. Make sure you discuss any questions you have with your health care provider. Document Released: 12/31/2015 Document Revised: 08/23/2016 Document Reviewed: 10/05/2015 Elsevier Interactive Patient Education  2017 [employer] Prevention in the Home Falls can cause injuries. They can happen to people of all ages. There are many things you can do to make your home safe and to help prevent falls. What can I do on the outside of my home?  Regularly fix the edges of walkways and driveways and fix any cracks.  Remove anything that might make you trip as you walk through a door, such as a raised step or threshold.  Trim any bushes or trees on the path to your home.  Use bright outdoor lighting.  Clear any walking paths of anything that might make someone trip, such as rocks or tools.  Regularly check to see if handrails are loose or broken. Make sure that both sides of any steps have handrails.  Any raised decks and porches should have guardrails on the edges.  Have any leaves, snow, or ice cleared regularly.  Use sand or salt on walking paths during winter.  Clean up any spills in your garage right away. This includes oil or grease spills. What can I do in the bathroom?  Use night lights.  Install grab bars by the toilet and in the tub and shower. Do not use towel bars as grab bars.  Use non-skid mats or decals in the tub or shower.  If you need to sit down in the shower, use a plastic, non-slip stool.  Keep the floor dry. Clean up any water that spills on the floor as soon as it happens.  Remove soap buildup in the tub or shower regularly.  Attach bath mats securely with double-sided non-slip rug  tape.  Do not have throw rugs and other things on the floor that can make you trip. What can I do in the bedroom?  Use night lights.  Make sure that you have a light by your bed that is easy to reach.  Do not use any sheets or blankets that are too big for your bed. They should not hang down onto the floor.  Have a firm chair that has side arms. You can use this for support while you get dressed.  Do not have throw rugs and other things on the floor that can make you trip. What can I do in the kitchen?  Clean up any spills right away.  Avoid walking on wet floors.  Keep items that you use a lot in easy-to-reach places.  If you need to reach something above you, use a strong step stool that has a grab bar.  Keep electrical cords out of the way.  Do not use floor polish or wax that makes floors slippery. If you must use wax, use non-skid floor wax.  Do not have throw rugs and other things on the floor that can make you trip. What can I do with  my stairs?  Do not leave any items on the stairs.  Make sure that there are handrails on both sides of the stairs and use them. Fix handrails that are broken or loose. Make sure that handrails are as long as the stairways.  Check any carpeting to make sure that it is firmly attached to the stairs. Fix any carpet that is loose or worn.  Avoid having throw rugs at the top or bottom of the stairs. If you do have throw rugs, attach them to the floor with carpet tape.  Make sure that you have a light switch at the top of the stairs and the bottom of the stairs. If you do not have them, ask someone to add them for you. What else can I do to help prevent falls?  Wear shoes that:  Do not have high heels.  Have rubber bottoms.  Are comfortable and fit you well.  Are closed at the toe. Do not wear sandals.  If you use a stepladder:  Make sure that it is fully opened. Do not climb a closed stepladder.  Make sure that both sides of the  stepladder are locked into place.  Ask someone to hold it for you, if possible.  Clearly mark and make sure that you can see:  Any grab bars or handrails.  First and last steps.  Where the edge of each step is.  Use tools that help you move around (mobility aids) if they are needed. These include:  Canes.  Walkers.  Scooters.  Crutches.  Turn on the lights when you go into a dark area. Replace any light bulbs as soon as they burn out.  Set up your furniture so you have a clear path. Avoid moving your furniture around.  If any of your floors are uneven, fix them.  If there are any pets around you, be aware of where they are.  Review your medicines with your doctor. Some medicines can make you feel dizzy. This can increase your chance of falling. Ask your doctor what other things that you can do to help prevent falls. This information is not intended to replace advice given to you by your health care provider. Make sure you discuss any questions you have with your health care provider. Document Released: 09/30/2009 Document Revised: 05/11/2016 Document Reviewed: 01/08/2015 Elsevier Interactive Patient Education  2017 [employer].

## 2020-07-12 NOTE — Telephone Encounter (Signed)
Spoke with pt today to complete his telephonic AWV and patient requested a refill on Atorvastatin 80 and Flomax 0.4 mg be sent in to his new pharmacy [facility]. Pharmacy has been updated in chart. LOV was 06/14/20 and next OV is scheduled for 12/14/20. Please advise, thank you.

## 2020-07-18 DIAGNOSIS — J449 Chronic obstructive pulmonary disease, unspecified: Secondary | ICD-10-CM | POA: Diagnosis not present

## 2020-07-21 DIAGNOSIS — F5102 Adjustment insomnia: Secondary | ICD-10-CM | POA: Diagnosis not present

## 2020-07-27 ENCOUNTER — Other Ambulatory Visit
Admission: RE | Admit: 2020-07-27 | Discharge: 2020-07-27 | Disposition: A | Discharge status: Home / Self Care | Payer: Medicare Other | Source: Ambulatory Visit | Attending: Rehabilitative and Restorative Service Providers" | Admitting: Rehabilitative and Restorative Service Providers"

## 2020-07-27 DIAGNOSIS — I5020 Unspecified systolic (congestive) heart failure: Secondary | ICD-10-CM | POA: Insufficient documentation

## 2020-07-27 DIAGNOSIS — R0602 Shortness of breath: Secondary | ICD-10-CM | POA: Insufficient documentation

## 2020-07-27 DIAGNOSIS — I509 Heart failure, unspecified: Secondary | ICD-10-CM | POA: Diagnosis not present

## 2020-07-27 DIAGNOSIS — I2510 Atherosclerotic heart disease of native coronary artery without angina pectoris: Secondary | ICD-10-CM | POA: Insufficient documentation

## 2020-07-27 DIAGNOSIS — I480 Paroxysmal atrial fibrillation: Secondary | ICD-10-CM | POA: Diagnosis not present

## 2020-07-27 DIAGNOSIS — J9811 Atelectasis: Secondary | ICD-10-CM | POA: Diagnosis not present

## 2020-07-27 LAB — BRAIN NATRIURETIC PEPTIDE: B Natriuretic Peptide: 357.4 pg/mL — ABNORMAL HIGH (ref 0.0–100.0)

## 2020-07-29 ENCOUNTER — Ambulatory Visit: Payer: Medicare Other | Admitting: Oncology

## 2020-07-29 ENCOUNTER — Other Ambulatory Visit: Payer: Self-pay | Admitting: Physician Assistant

## 2020-07-29 NOTE — Telephone Encounter (Signed)
Requested medication (s) are due for refill today: no  Requested medication (s) are on the active medication list: yes  Last refill:  03/19/20  141ml  0 refills  Future visit scheduled: yes  Notes to clinic:  not delegated per protocol     Requested Prescriptions  Pending Prescriptions Disp Refills   chlorpheniramine-HYDROcodone ([place]) 10-8 MG/5ML [place] [Pharmacy Med Name: HYDROCOD POLST-CPM POLST ER 10-8 MG] 115 mL     Sig: TAKE 1 TEASPOONFUL EVERY 12 HOURS AS NEEDED FOR COUGH      Off-Protocol Failed - 07/29/2020  4:20 PM      Failed - Medication not assigned to a protocol, review manually.      Passed - Valid encounter within last 12 months    Recent Outpatient Visits           1 month ago Chronic obstructive pulmonary disease, unspecified COPD type [facility])   [facility] Birdie Sons, MD   4 months ago COPD with acute exacerbation [facility])   [place], [place]   9 months ago Cough   [facility] Birdie Sons, MD   1 year ago COPD exacerbation [facility])   [facility] Birdie Sons, MD   1 year ago Chest wall pain   [facility] Birdie Sons, MD       Future Appointments             In 4 months Fisher, Kirstie Peri, MD [facility], [place]

## 2020-07-30 NOTE — Telephone Encounter (Signed)
I don't refill this on a chronic basis. If he has a new cough he needs to be evaluated. I have declined the refill.

## 2020-07-31 IMAGING — CR DG CHEST 2V
1 series · 2 of 2 positions shown · non-contrast
Comparison: 07/13/2019

CLINICAL DATA: Left-sided chest pain

EXAM:
CHEST - 2 VIEW

[Series 1: dg chest 2 view · 0.14mm/px · 2 of 2 slices shown]
[im 1/2]
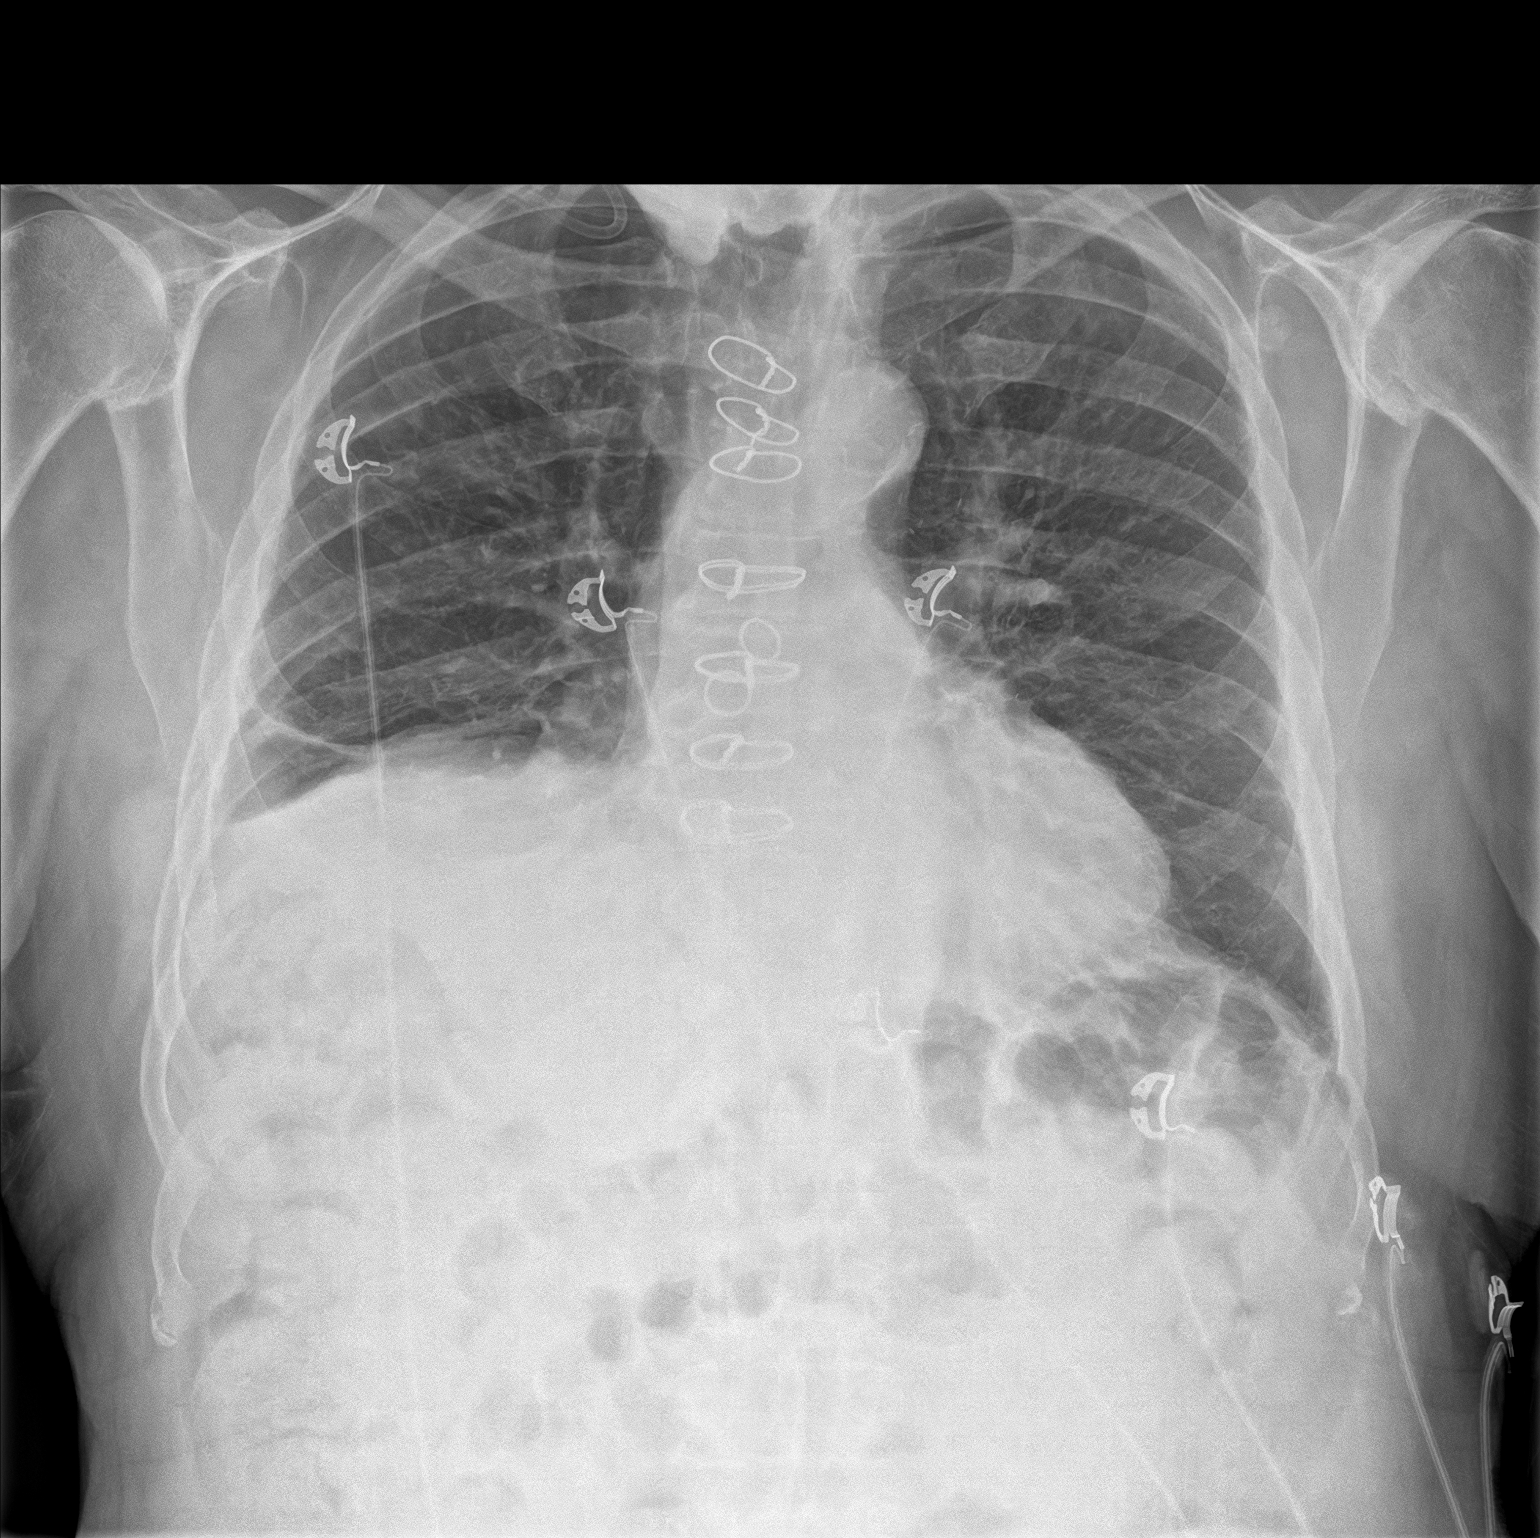
[im 2/2]
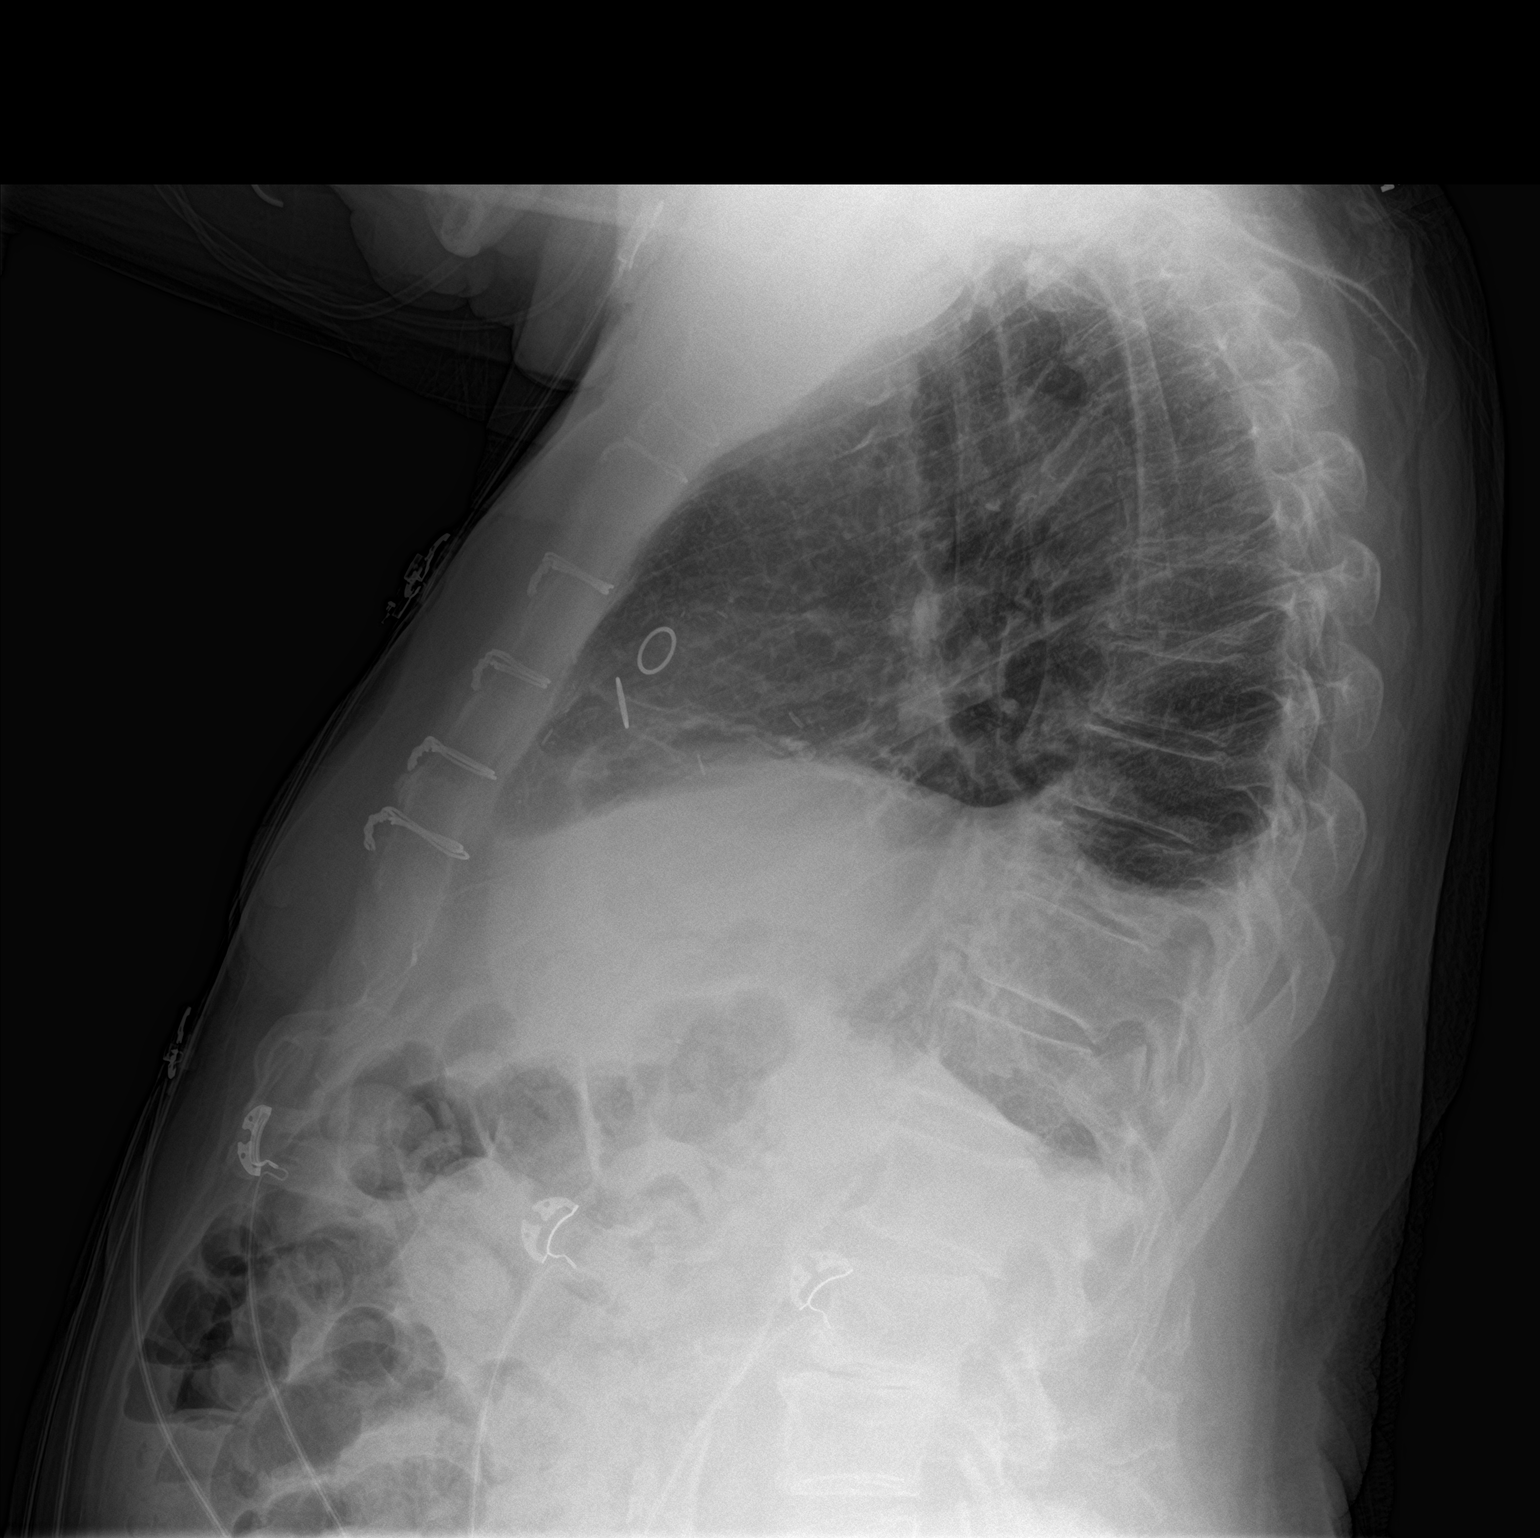

[2 of 2 positions shown; findings below may reference images not displayed]

FINDINGS: Post CABG changes. Stable cardiomediastinal contours. Elevation of
the right hemidiaphragm with linear right basilar scarring versus
atelectasis. Probable trace right pleural effusion. Left lung is
clear. No pneumothorax. Advanced degenerative changes of the left
shoulder.
IMPRESSION: Chronic right basilar volume loss with linear right basilar scarring
versus atelectasis. Probable trace right pleural effusion.

## 2020-08-09 ENCOUNTER — Other Ambulatory Visit: Payer: Self-pay

## 2020-08-09 ENCOUNTER — Ambulatory Visit (INDEPENDENT_AMBULATORY_CARE_PROVIDER_SITE_OTHER): Payer: Medicare Other | Admitting: Family Medicine

## 2020-08-09 ENCOUNTER — Encounter: Payer: Self-pay | Admitting: Family Medicine

## 2020-08-09 VITALS — BP 111/69 | HR 73 | Temp 97.7°F | Resp 18 | Wt 170.0 lb

## 2020-08-09 DIAGNOSIS — R0600 Dyspnea, unspecified: Secondary | ICD-10-CM | POA: Diagnosis not present

## 2020-08-09 DIAGNOSIS — R05 Cough: Secondary | ICD-10-CM | POA: Diagnosis not present

## 2020-08-09 DIAGNOSIS — R053 Chronic cough: Secondary | ICD-10-CM

## 2020-08-09 DIAGNOSIS — J449 Chronic obstructive pulmonary disease, unspecified: Secondary | ICD-10-CM | POA: Diagnosis not present

## 2020-08-09 DIAGNOSIS — R0609 Other forms of dyspnea: Secondary | ICD-10-CM

## 2020-08-09 MED ORDER — HYDROCOD POLST-CPM POLST ER 10-8 MG/5ML PO SUER: ORAL | 0 refills | Status: DC

## 2020-08-09 NOTE — Progress Notes (Signed)
I,Roshena L Chambers,acting as a scribe for Lelon Huh, MD.,have documented all relevant documentation on the behalf of Lelon Huh, MD,as directed by  Lelon Huh, MD while in the presence of Lelon Huh, MD.  Established patient visit   Patient: James [name]   DOB: 1929/04/08   86 y.o. Male  MRN: [id number] Visit Date: 08/09/2020  Today's healthcare provider: Lelon Huh, MD   Chief Complaint  Patient presents with  . COPD   Subjective    HPI  Follow up for COPD:   The patient was last seen for this 2 months ago. Changes made at last visit include continuing same inhalers.  He reports good compliance with treatment. He feels that condition is Worse. Patient has been experiencing dyspnea during exertion. He has been seen by Cardiology and Pulmonology, but nothing they recommend seems to help. Patient has some swelling in his feet and often feels off balance when he walks. Patient has been using home oxygen more than usual within the past 1-2 weeks.  He has also needed to use his rescue inhaler multiple times a day. Patient's wife reports that he has been complaining about having trouble swallowing and neck pain.  He had cardiology follow up on 8/10 and noted to have increase of BNP to 357.4. Echo was to be ordered can't get it done until mid September. Noted to be mildly anemic in March with Hgb of 11.9. Was seen by pulmonary prior to that time and started on furosemide. 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 He did start him on nebulized Pulmicort and Brovana BID. Was also changed from lorazepam to trazodone and he states he became much more short of breath while taking trazodone. He has switched back to lorazepam last week. And breathing has improved since then.   ----------------------------------------------------------------------------------------- Wt Readings from Last 3 Encounters:  08/09/20 170 lb (77.1 kg)  06/14/20 170 lb (77.1 kg)    02/18/20 175 lb (79.4 kg)         Medications: Outpatient Medications Prior to Visit  Medication Sig  . atorvastatin (LIPITOR) 80 MG tablet TAKE 1 TABLET BY MOUTH DAILY AT 6PM  . azelastine (OPTIVAR) 0.05 % ophthalmic solution APPLY 1 DROP INTO THE EYE TWICE DAILY ASDIRECTED  . beta carotene w/minerals (OCUVITE) tablet Take 1 tablet by mouth daily.  . budesonide (PULMICORT) 0.5 MG/2ML nebulizer solution Take 2 mLs (0.5 mg total) by nebulization 2 (two) times daily. Dx:j44.9  . chlorpheniramine-HYDROcodone (TUSSIONEX) 10-8 MG/5ML SUER TAKE 1 TEASPOONFUL EVERY 12 HOURS AS NEEDED FOR COUGH  . ELIQUIS 5 MG TABS tablet TAKE 1 TABLET BY MOUTH TWICE DAILY  . fluticasone (FLONASE) 50 MCG/ACT nasal spray Place 2 sprays into both nostrils 2 (two) times daily.  . furosemide (LASIX) 40 MG tablet Take 40 mg by mouth daily.  [name] ipratropium-albuterol (DUONEB) 0.5-2.5 (3) MG/3ML SOLN Take 3 mLs by nebulization 4 (four) times daily. (Patient not taking: Reported on 07/12/2020)  . lansoprazole (PREVACID) 30 MG capsule TAKE 1 CAPSULE BY MOUTH EVERY DAY  . LORazepam (ATIVAN) 1 MG tablet Take 1 tablet (1 mg total) by mouth at bedtime.  . Magnesium 400 MG CAPS Take 1 capsule by mouth daily.   . metoprolol tartrate (LOPRESSOR) 50 MG tablet TAKE ONE TABLET TWICE DAILY  . montelukast (SINGULAIR) 10 MG tablet TAKE 1 TABLET BY MOUTH AT BEDTIME  . MULTIPLE VITAMIN PO Take 1 tablet by mouth daily.   . Omega-3 Fatty Acids (FISH OIL) 1000 MG CAPS  Take 1 capsule daily by mouth.  . tamsulosin (FLOMAX) 0.4 MG CAPS capsule Take 1 capsule (0.4 mg total) by mouth daily.  . [DISCONTINUED] famotidine (PEPCID) 20 MG tablet Take 1 tablet by mouth 1 day or 1 dose. (Patient not taking: Reported on 08/09/2020)  . [DISCONTINUED] predniSONE (DELTASONE) 5 MG tablet Take 5 mg by mouth daily with breakfast. (Patient not taking: Reported on 08/09/2020)   No facility-administered medications prior to visit.    Review of Systems   Constitutional: Negative for appetite change, chills and fever.  Respiratory: Positive for shortness of breath. Negative for chest tightness and wheezing.   Cardiovascular: Negative for chest pain and palpitations.  Gastrointestinal: Negative for abdominal pain, nausea and vomiting.  Neurological: Positive for dizziness.       Off balance when walking  Psychiatric/Behavioral: Positive for sleep disturbance.      Objective    BP 111/69 (BP Location: Left Arm, Patient Position: Sitting, Cuff Size: Large)   Pulse 73   Temp 97.7 F (36.5 C) (Oral)   Resp 18   Wt 170 lb (77.1 kg)   SpO2 99% Comment: room air  BMI 25.85 kg/m   O2 on r/a drops to 90% when ambulating,  Physical Exam   General: Appearance:     Overweight male in no acute distress  Eyes:    PERRL, conjunctiva/corneas clear, EOM's intact       Lungs:     Clear to auscultation bilaterally, respirations unlabored  Heart:    Normal heart rate. Irregularly irregular rhythm. No murmurs, rubs, or gallops.   MS:   All extremities are intact.   Neurologic:   Awake, alert, oriented x 3. No apparent focal neurological           defect.         No results found for any visits on 08/09/20.  Assessment & Plan     1. Dyspnea on exertion He feels like it has greatly improved since changing back from trazodone to lorazepam a few days ago. He is noted to have had elevated BNP 2 weeks ago but can't get echo until mid September. Will check- Brain natriuretic peptide for stability, and check- CBC Continue 40mg  furosemide for now.   2. Chronic cough refill- chlorpheniramine-HYDROcodone (TUSSIONEX) 10-8 MG/5ML SUER; TAKE 1 TEASPOONFUL EVERY 12 HOURS AS NEEDED FOR COUGH  Dispense: 115 mL; Refill: 0  3. Chronic obstructive pulmonary disease, unspecified COPD type ([place]) Stable on current inhalers prescription by Dr. Lanney Gins.    No follow-ups on file.      The entirety of the information documented in the History of Present  Illness, Review of Systems and Physical Exam were personally obtained by me. Portions of this information were initially documented by the CMA and reviewed by me for thoroughness and accuracy.      Lelon Huh, MD  [facility] [phone] (phone) [phone] (fax)  [place]

## 2020-08-10 LAB — BRAIN NATRIURETIC PEPTIDE: BNP: 124 pg/mL — ABNORMAL HIGH (ref 0.0–100.0)

## 2020-08-10 LAB — CBC
Hematocrit: 39.6 % (ref 37.5–51.0)
Hemoglobin: 12.9 g/dL — ABNORMAL LOW (ref 13.0–17.7)
MCH: 28.7 pg (ref 26.6–33.0)
MCHC: 32.6 g/dL (ref 31.5–35.7)
MCV: 88 fL (ref 79–97)
Platelets: 161 10*3/uL (ref 150–450)
RBC: 4.5 x10E6/uL (ref 4.14–5.80)
RDW: 13.6 % (ref 11.6–15.4)
WBC: 4.8 10*3/uL (ref 3.4–10.8)

## 2020-08-18 DIAGNOSIS — J449 Chronic obstructive pulmonary disease, unspecified: Secondary | ICD-10-CM | POA: Diagnosis not present

## 2020-08-26 DIAGNOSIS — J449 Chronic obstructive pulmonary disease, unspecified: Secondary | ICD-10-CM | POA: Diagnosis not present

## 2020-09-02 DIAGNOSIS — I5020 Unspecified systolic (congestive) heart failure: Secondary | ICD-10-CM | POA: Diagnosis not present

## 2020-09-02 DIAGNOSIS — I480 Paroxysmal atrial fibrillation: Secondary | ICD-10-CM | POA: Diagnosis not present

## 2020-09-02 DIAGNOSIS — R0602 Shortness of breath: Secondary | ICD-10-CM | POA: Diagnosis not present

## 2020-09-06 DIAGNOSIS — J449 Chronic obstructive pulmonary disease, unspecified: Secondary | ICD-10-CM | POA: Diagnosis not present

## 2020-09-06 DIAGNOSIS — I5020 Unspecified systolic (congestive) heart failure: Secondary | ICD-10-CM | POA: Diagnosis not present

## 2020-09-06 DIAGNOSIS — J42 Unspecified chronic bronchitis: Secondary | ICD-10-CM | POA: Diagnosis not present

## 2020-09-06 DIAGNOSIS — I480 Paroxysmal atrial fibrillation: Secondary | ICD-10-CM | POA: Diagnosis not present

## 2020-09-06 DIAGNOSIS — I10 Essential (primary) hypertension: Secondary | ICD-10-CM | POA: Diagnosis not present

## 2020-09-15 ENCOUNTER — Ambulatory Visit: Payer: Medicare Other | Attending: Internal Medicine

## 2020-09-15 DIAGNOSIS — G4733 Obstructive sleep apnea (adult) (pediatric): Secondary | ICD-10-CM | POA: Diagnosis not present

## 2020-09-15 DIAGNOSIS — G4761 Periodic limb movement disorder: Secondary | ICD-10-CM | POA: Diagnosis not present

## 2020-09-16 ENCOUNTER — Other Ambulatory Visit: Payer: Self-pay

## 2020-09-17 DIAGNOSIS — J449 Chronic obstructive pulmonary disease, unspecified: Secondary | ICD-10-CM | POA: Diagnosis not present

## 2020-09-18 DIAGNOSIS — G4730 Sleep apnea, unspecified: Secondary | ICD-10-CM | POA: Diagnosis not present

## 2020-09-23 DIAGNOSIS — J432 Centrilobular emphysema: Secondary | ICD-10-CM | POA: Diagnosis not present

## 2020-09-27 ENCOUNTER — Other Ambulatory Visit: Payer: Self-pay | Admitting: Family Medicine

## 2020-09-27 DIAGNOSIS — H6123 Impacted cerumen, bilateral: Secondary | ICD-10-CM | POA: Diagnosis not present

## 2020-09-27 DIAGNOSIS — H73002 Acute myringitis, left ear: Secondary | ICD-10-CM | POA: Diagnosis not present

## 2020-09-27 NOTE — Telephone Encounter (Signed)
Requested medication (s) are due for refill today: Yes  Requested medication (s) are on the active medication list: Yes  Last refill:  04/13/20  Future visit scheduled: Yes  Notes to clinic:  See request.    Requested Prescriptions  Pending Prescriptions Disp Refills   LORazepam (ATIVAN) 1 MG tablet [Pharmacy Med Name: LORAZEPAM 1 MG TAB] 30 tablet     Sig: TAKE 1 TABLET BY MOUTH AT BEDTIME      Not Delegated - Psychiatry:  Anxiolytics/Hypnotics Failed - 09/27/2020  9:11 AM      Failed - This refill cannot be delegated      Failed - Urine Drug Screen completed in last 360 days.      Passed - Valid encounter within last 6 months    Recent Outpatient Visits           1 month ago Dyspnea on exertion   [facility] Birdie Sons, MD   3 months ago Chronic obstructive pulmonary disease, unspecified COPD type [facility])   [facility] Birdie Sons, MD   6 months ago COPD with acute exacerbation [facility])   [place], [place]   11 months ago Cough   [facility] Birdie Sons, MD   1 year ago COPD exacerbation [facility])   [facility] Birdie Sons, MD       Future Appointments             In 2 months Fisher, Kirstie Peri, MD [facility], [place]

## 2020-10-05 ENCOUNTER — Ambulatory Visit: Payer: Medicare Other | Attending: Internal Medicine

## 2020-10-05 DIAGNOSIS — G4733 Obstructive sleep apnea (adult) (pediatric): Secondary | ICD-10-CM | POA: Insufficient documentation

## 2020-10-05 DIAGNOSIS — G4761 Periodic limb movement disorder: Secondary | ICD-10-CM | POA: Diagnosis not present

## 2020-10-06 ENCOUNTER — Other Ambulatory Visit: Payer: Self-pay

## 2020-10-06 DIAGNOSIS — J449 Chronic obstructive pulmonary disease, unspecified: Secondary | ICD-10-CM | POA: Diagnosis not present

## 2020-10-10 DIAGNOSIS — G4733 Obstructive sleep apnea (adult) (pediatric): Secondary | ICD-10-CM | POA: Diagnosis not present

## 2020-10-15 ENCOUNTER — Encounter: Payer: PRIVATE HEALTH INSURANCE | Attending: Pulmonary Disease | Admitting: *Deleted

## 2020-10-15 ENCOUNTER — Other Ambulatory Visit: Payer: Self-pay

## 2020-10-15 ENCOUNTER — Encounter: Payer: Self-pay | Admitting: *Deleted

## 2020-10-15 DIAGNOSIS — J449 Chronic obstructive pulmonary disease, unspecified: Secondary | ICD-10-CM

## 2020-10-15 NOTE — Progress Notes (Signed)
Initial telephone encounter completed. Diagnosis can be found in [facility] 10/7. EP orientation scheduled for 11/1 2:30pm

## 2020-10-18 ENCOUNTER — Encounter: Payer: PRIVATE HEALTH INSURANCE | Attending: Pulmonary Disease

## 2020-10-18 ENCOUNTER — Other Ambulatory Visit: Payer: Self-pay

## 2020-10-18 VITALS — Ht 63.5 in | Wt 177.5 lb

## 2020-10-18 DIAGNOSIS — J449 Chronic obstructive pulmonary disease, unspecified: Secondary | ICD-10-CM | POA: Insufficient documentation

## 2020-10-18 DIAGNOSIS — R0600 Dyspnea, unspecified: Secondary | ICD-10-CM | POA: Insufficient documentation

## 2020-10-18 NOTE — Progress Notes (Signed)
Pulmonary Individual Treatment Plan  Patient Details  Name: James [name] MRN: [id number] Date of Birth: 04/22/1929 Referring Provider:     Pulmonary Rehab from 10/18/2020 in [facility]  Referring Provider Aleskerov      Initial Encounter Date:    Pulmonary Rehab from 10/18/2020 in [facility]  Date 10/18/20      Visit Diagnosis: Chronic obstructive pulmonary disease, unspecified COPD type (HCC)  Patient's Home Medications on Admission:  Current Outpatient Medications:  .  albuterol (VENTOLIN HFA) 108 (90 Base) MCG/ACT inhaler, Inhale 2 puffs into the lungs every 6 (six) hours as needed., Disp: , Rfl:  .  atorvastatin (LIPITOR) 80 MG tablet, TAKE 1 TABLET BY MOUTH DAILY AT 6PM, Disp: 90 tablet, Rfl: 3 .  azelastine (OPTIVAR) 0.05 % ophthalmic solution, APPLY 1 DROP INTO THE EYE TWICE DAILY ASDIRECTED, Disp: 6 mL, Rfl: 5 .  azithromycin (ZITHROMAX) 250 MG tablet, Take 250 mg by mouth 3 (three) times a week., Disp: , Rfl:  .  beta carotene w/minerals (OCUVITE) tablet, Take 1 tablet by mouth daily., Disp: , Rfl:  .  budesonide (PULMICORT) 0.5 MG/2ML nebulizer solution, Take 2 mLs (0.5 mg total) by nebulization 2 (two) times daily. Dx:j44.9, Disp: 75 mL, Rfl: 12 .  chlorpheniramine-HYDROcodone (TUSSIONEX) 10-8 MG/5ML SUER, TAKE 1 TEASPOONFUL EVERY 12 HOURS AS NEEDED FOR COUGH, Disp: 115 mL, Rfl: 0 .  ELIQUIS 5 MG TABS tablet, TAKE 1 TABLET BY MOUTH TWICE DAILY, Disp: 180 tablet, Rfl: 4 .  fluticasone (FLONASE) 50 MCG/ACT nasal spray, Place 2 sprays into both nostrils 2 (two) times daily., Disp: 48 g, Rfl: 3 .  furosemide (LASIX) 40 MG tablet, Take 40 mg by mouth daily., Disp: , Rfl:  .  ipratropium-albuterol (DUONEB) 0.5-2.5 (3) MG/3ML SOLN, Take 3 mLs by nebulization 4 (four) times daily. (Patient not taking: Reported on 07/12/2020), Disp: , Rfl:  .  lansoprazole (PREVACID) 30 MG capsule, TAKE 1 CAPSULE BY MOUTH EVERY DAY, Disp: 90 capsule, Rfl:  3 .  LORazepam (ATIVAN) 1 MG tablet, TAKE 1 TABLET BY MOUTH AT BEDTIME, Disp: 30 tablet, Rfl: 5 .  Magnesium 400 MG CAPS, Take 1 capsule by mouth daily. , Disp: , Rfl:  .  metolazone (ZAROXOLYN) 5 MG tablet, Take 5 mg by mouth daily., Disp: , Rfl:  .  metoprolol tartrate (LOPRESSOR) 50 MG tablet, TAKE ONE TABLET TWICE DAILY, Disp: 60 tablet, Rfl: 0 .  montelukast (SINGULAIR) 10 MG tablet, TAKE 1 TABLET BY MOUTH AT BEDTIME, Disp: 90 tablet, Rfl: 1 .  MULTIPLE VITAMIN PO, Take 1 tablet by mouth daily. , Disp: , Rfl:  .  Omega-3 Fatty Acids (FISH OIL) 1000 MG CAPS, Take 1 capsule daily by mouth., Disp: , Rfl:  .  potassium chloride (KLOR-CON) 10 MEQ tablet, Take 10 mEq by mouth daily., Disp: , Rfl:  .  traZODone (DESYREL) 100 MG tablet, Take 100 mg by mouth at bedtime., Disp: , Rfl:   Past Medical History: Past Medical History:  Diagnosis Date  . Arthritis   . Back pain   . CAD (coronary artery disease)   . CAP (community acquired pneumonia) 11/06/2017  . Cataract    right  . COPD (chronic obstructive pulmonary disease) (HCC)    SPiriva and SYmbicort daily. Albuterol as needed  . Diverticulosis   . Dyspnea    with exertion  . Enlarged prostate    takes Flomax daily  . GERD (gastroesophageal reflux disease)   . History   of chicken pox   . History of gout   . History of measles   . History of mumps   . HOH (hard of hearing)   . Hyperglycemia   . Hyperlipidemia    takes Atorvastatin daily  . Hypertension    takes Metoprolol daily as well as Lotensin HCT  . Hypotension   . Joint pain   . Microscopic colitis   . PAF (paroxysmal atrial fibrillation) (HCC), RVR 03/14/2017  . Pneumonia   . Prostate cancer (HCC)   . Stroke (HCC)    TIA  . TIA (transient ischemic attack)   . Weakness    numbness and tingling.mainly on right    Tobacco Use: Social History   Tobacco Use  Smoking Status Former Smoker  . Packs/day: 1.00  . Years: 11.00  . Pack years: 11.00  . Types: Cigarettes   . Quit date: 02/12/1984  . Years since quitting: 36.7  Smokeless Tobacco Never Used  Tobacco Comment   quit smoking 30 yrs ago    Labs: Recent Review Flowsheet Data    Labs for ITP Cardiac and Pulmonary Rehab Latest Ref Rng & Units 10/05/2014 10/07/2014 10/31/2017 12/03/2018 07/13/2019   Cholestrol 0 - 200 mg/dL - 104 106 99 -   LDLCALC 0 - 99 mg/dL - 40 45 34 -   HDL >40 mg/dL - 52 52 55 -   Trlycerides <150 mg/dL - 61 47 51 -   Hemoglobin A1c 4.8 - 5.6 % 5.7 5.7 6.1(H) - 6.1(H)   HCO3 20.0 - 28.0 mmol/L - - - - 30.1(H)   O2SAT % - - - - 75.8       Pulmonary Assessment Scores:  Pulmonary Assessment Scores    Row Name 10/18/20 1600         ADL UCSD   SOB Score total 59     Rest 1     Walk 3     Stairs 4     Bath 2     Dress 3     Shop 2       CAT Score   CAT Score 19       mMRC Score   mMRC Score 1            UCSD: Self-administered rating of dyspnea associated with activities of daily living (ADLs) 6-point scale (0 = "not at all" to 5 = "maximal or unable to do because of breathlessness")  Scoring Scores range from 0 to 120.  Minimally important difference is 5 units  CAT: CAT can identify the health impairment of COPD patients and is better correlated with disease progression.  CAT has a scoring range of zero to 40. The CAT score is classified into four groups of low (less than 10), medium (10 - 20), high (21-30) and very high (31-40) based on the impact level of disease on health status. A CAT score over 10 suggests significant symptoms.  A worsening CAT score could be explained by an exacerbation, poor medication adherence, poor inhaler technique, or progression of COPD or comorbid conditions.  CAT MCID is 2 points  mMRC: mMRC (Modified Medical Research Council) Dyspnea Scale is used to assess the degree of baseline functional disability in patients of respiratory disease due to dyspnea. No minimal important difference is established. A decrease in score  of 1 point or greater is considered a positive change.   Pulmonary Function Assessment:   Exercise Target Goals: Exercise Program Goal: Individual exercise prescription set using   results from initial 6 min walk test and THRR while considering  patient's activity barriers and safety.   Exercise Prescription Goal: Initial exercise prescription builds to 30-45 minutes a day of aerobic activity, 2-3 days per week.  Home exercise guidelines will be given to patient during program as part of exercise prescription that the participant will acknowledge.  Education: Aerobic Exercise & Resistance Training: - Gives group verbal and written instruction on the various components of exercise. Focuses on aerobic and resistive training programs and the benefits of this training and how to safely progress through these programs..   Pulmonary Rehab from 02/21/2017 in [facility] Pulmonary Rehab  Date 01/17/17  Educator [facility]  Instruction Review Code (retired) 2- meets goals/outcomes      Education: Exercise & Equipment Safety: - Individual verbal instruction and demonstration of equipment use and safety with use of the equipment.   Pulmonary Rehab from 10/18/2020 in [facility] Card[facility] Pulmonary Rehab  Date 10/18/20  Educator AS  Instruction Review Code 1- Verbalizes Understanding      Education: Exercise Physiology & General Exercise Guidelines: - Group verbal and written instruction with models to review the exercise physiology of the cardiovascular system and associated critical values. Provides general exercise guidelines with specific guidelines to those with heart or lung disease.    Pulmonary Rehab from 02/21/2017 in [facility] Cardiac and Pulmonary Rehab  Date 12/20/16  Educator [facility]  Instruction Review Code (retired) 2- meets goals/outcomes      Education: Flexibility, Insurance underwriter, Mind/Body Relaxation: Provides group verbal/written instruction on the benefits of flexibility and balance training,  including mind/body exercise modes such as yoga, pilates and tai chi.  Demonstration and skill practice provided.   Pulmonary Rehab from 11/01/2018 in [facility] Cardiac and Pulmonary Rehab  Date 10/09/18  Educator AS  Instruction Review Code 1- Verbalizes Understanding      Activity Barriers & Risk Stratification:  Activity Barriers & Cardiac Risk Stratification - 10/15/20 1305      Activity Barriers & Cardiac Risk Stratification   Activity Barriers Back Problems;Arthritis;Shortness of Breath           6 Minute Walk:  6 Minute Walk    Row Name 10/18/20 1546         6 Minute Walk   Phase Initial     Distance 495 feet     Walk Time 5.3 minutes     # of Rest Breaks 1     MPH 1.06     METS 1.77     RPE 19     Perceived Dyspnea  3     VO2 Peak 0.6     Symptoms Yes (comment)     Comments shortness of breath     Resting HR 98 bpm     Resting BP 108/52     Resting Oxygen Saturation  95 %     Exercise Oxygen Saturation  during 6 min walk 89 %     Max Ex. HR 110 bpm     Max Ex. BP 116/54     2 Minute Post BP 104/56       Interval HR   1 Minute HR 98     2 Minute HR 104     3 Minute HR 108     4 Minute HR 108     5 Minute HR 109     6 Minute HR 110     2 Minute Post HR 99     Interval Heart  Rate? Yes       Interval Oxygen   Interval Oxygen? Yes     Baseline Oxygen Saturation % 95 %     1 Minute Oxygen Saturation % 93 %     1 Minute Liters of Oxygen 0 L     2 Minute Oxygen Saturation % 90 %     2 Minute Liters of Oxygen 0 L     3 Minute Oxygen Saturation % 90 %     3 Minute Liters of Oxygen 0 L     4 Minute Oxygen Saturation % 93 %     4 Minute Liters of Oxygen 0 L     5 Minute Oxygen Saturation % 93 %     5 Minute Liters of Oxygen 0 L     6 Minute Oxygen Saturation % 91 %     6 Minute Liters of Oxygen 0 L     2 Minute Post Oxygen Saturation % 95 %     2 Minute Post Liters of Oxygen 0 L           Oxygen Initial Assessment:  Oxygen Initial Assessment -  10/15/20 1304      Home Oxygen   Home Oxygen Device Home Concentrator    Sleep Oxygen Prescription Continuous    Liters per minute 2    Home Exercise Oxygen Prescription None    Home Resting Oxygen Prescription None    Compliance with Home Oxygen Use Yes      Initial 6 min Walk   Oxygen Used None      Program Oxygen Prescription   Program Oxygen Prescription None      Intervention   Short Term Goals To learn and demonstrate proper pursed lip breathing techniques or other breathing techniques.;To learn and understand importance of maintaining oxygen saturations>88%;To learn and understand importance of monitoring SPO2 with pulse oximeter and demonstrate accurate use of the pulse oximeter.;To learn and demonstrate proper use of respiratory medications    Long  Term Goals Verbalizes importance of monitoring SPO2 with pulse oximeter and return demonstration;Maintenance of O2 saturations>88%;Exhibits proper breathing techniques, such as pursed lip breathing or other method taught during program session;Compliance with respiratory medication;Demonstrates proper use of MDI's           Oxygen Re-Evaluation:   Oxygen Discharge (Final Oxygen Re-Evaluation):   Initial Exercise Prescription:  Initial Exercise Prescription - 10/18/20 1500      Date of Initial Exercise RX and Referring Provider   Date 10/18/20    Referring Provider Aleskerov      Treadmill   MPH 1    Grade 0    Minutes 15    METs 1.77      Recumbant Bike   Level 1    RPM 60    Minutes 15    METs 1.5      NuStep   Level 1    SPM 80    Minutes 15    METs 1.5      Arm Ergometer   Level 1    RPM 30    Minutes 15    METs 1.5      T5 Nustep   Level 1    SPM 80    Minutes 15    METs 1.5      Prescription Details   Frequency (times per week) 3    Duration Progress to 30 minutes of continuous aerobic without signs/symptoms of physical distress        Intensity   THRR 40-80% of Max Heartrate 111-124     Ratings of Perceived Exertion 11-13    Perceived Dyspnea 0-4      Progression   Progression Continue to progress workloads to maintain intensity without signs/symptoms of physical distress.      Resistance Training   Training Prescription Yes    Weight 3 lb    Reps 10-15           Perform Capillary Blood Glucose checks as needed.  Exercise Prescription Changes:  Exercise Prescription Changes    Row Name 10/18/20 1500             Response to Exercise   Blood Pressure (Admit) 108/52       Blood Pressure (Exercise) 116/54       Blood Pressure (Exit) 104/56       Heart Rate (Admit) 98 bpm       Heart Rate (Exercise) 110 bpm       Heart Rate (Exit) 99 bpm       Oxygen Saturation (Admit) 95 %       Oxygen Saturation (Exercise) 89 %       Oxygen Saturation (Exit) 95 %       Rating of Perceived Exertion (Exercise) 19       Perceived Dyspnea (Exercise) 3       Symptoms SOB              Exercise Comments:   Exercise Goals and Review:  Exercise Goals    Row Name 10/18/20 1558             Exercise Goals   Increase Physical Activity Yes       Intervention Provide advice, education, support and counseling about physical activity/exercise needs.;Develop an individualized exercise prescription for aerobic and resistive training based on initial evaluation findings, risk stratification, comorbidities and participant's personal goals.       Expected Outcomes Short Term: Attend rehab on a regular basis to increase amount of physical activity.;Long Term: Add in home exercise to make exercise part of routine and to increase amount of physical activity.;Long Term: Exercising regularly at least 3-5 days a week.       Increase Strength and Stamina Yes       Intervention Provide advice, education, support and counseling about physical activity/exercise needs.;Develop an individualized exercise prescription for aerobic and resistive training based on initial evaluation findings, risk  stratification, comorbidities and participant's personal goals.       Expected Outcomes Short Term: Increase workloads from initial exercise prescription for resistance, speed, and METs.;Short Term: Perform resistance training exercises routinely during rehab and add in resistance training at home;Long Term: Improve cardiorespiratory fitness, muscular endurance and strength as measured by increased METs and functional capacity (6MWT)       Able to understand and use rate of perceived exertion (RPE) scale Yes       Intervention Provide education and explanation on how to use RPE scale       Expected Outcomes Short Term: Able to use RPE daily in rehab to express subjective intensity level;Long Term:  Able to use RPE to guide intensity level when exercising independently       Able to understand and use Dyspnea scale Yes       Intervention Provide education and explanation on how to use Dyspnea scale       Expected Outcomes Short Term: Able to use Dyspnea scale daily in rehab to express subjective  sense of shortness of breath during exertion;Long Term: Able to use Dyspnea scale to guide intensity level when exercising independently       Knowledge and understanding of Target Heart Rate Range (THRR) Yes       Intervention Provide education and explanation of THRR including how the numbers were predicted and where they are located for reference       Expected Outcomes Short Term: Able to state/look up THRR;Short Term: Able to use daily as guideline for intensity in rehab;Long Term: Able to use THRR to govern intensity when exercising independently       Able to check pulse independently Yes       Intervention Review the importance of being able to check your own pulse for safety during independent exercise       Expected Outcomes Short Term: Able to explain why pulse checking is important during independent exercise;Long Term: Able to check pulse independently and accurately       Understanding of Exercise  Prescription Yes       Intervention Provide education, explanation, and written materials on patient's individual exercise prescription       Expected Outcomes Short Term: Able to explain program exercise prescription;Long Term: Able to explain home exercise prescription to exercise independently              Exercise Goals Re-Evaluation :   Discharge Exercise Prescription (Final Exercise Prescription Changes):  Exercise Prescription Changes - 10/18/20 1500      Response to Exercise   Blood Pressure (Admit) 108/52    Blood Pressure (Exercise) 116/54    Blood Pressure (Exit) 104/56    Heart Rate (Admit) 98 bpm    Heart Rate (Exercise) 110 bpm    Heart Rate (Exit) 99 bpm    Oxygen Saturation (Admit) 95 %    Oxygen Saturation (Exercise) 89 %    Oxygen Saturation (Exit) 95 %    Rating of Perceived Exertion (Exercise) 19    Perceived Dyspnea (Exercise) 3    Symptoms SOB           Nutrition:  Target Goals: Understanding of nutrition guidelines, daily intake of sodium <1500mg, cholesterol <200mg, calories 30% from fat and 7% or less from saturated fats, daily to have 5 or more servings of fruits and vegetables.  Education: Controlling Sodium/Reading Food Labels -Group verbal and written material supporting the discussion of sodium use in heart healthy nutrition. Review and explanation with models, verbal and written materials for utilization of the food label.   Pulmonary Rehab from 02/21/2017 in [facility] Pulmonary Re[facility]  Date 01/08/17  Educator CR  Instruction Review Code (retired) 2- meets goals/outcomes      Education: General Nutrition Guidelines/Fats and Fiber: -Group instruction provided by verbal, written material, models and posters to present the general guidelines for heart healthy nutrition. Gives an explanation and review of dietary fats and fiber.   Pulmonary Rehab from 11/01/2018 in [facility] and Pulmonary Rehab  Date 10/23/18  Educator LB  Instruction  Review Code 1- Verbalizes Understanding      Biometrics:  Pre Biometrics - 10/18/20 1559      Pre Biometrics   Height 5' 3.5" (1.613 m)    Weight 177 lb 8 oz (80.5 kg)    BMI (Calculated) 30.95    Single Leg Stand 2.31 seconds            Nutrition Therapy Plan and Nutrition Goals:   Nutrition Assessments:  Nutrition Assessments - 10/18/20   1606      MEDFICTS Scores   Pre Score 16           MEDIFICTS Score Key:          ?70 Need to make dietary changes          40-70 Heart Healthy Diet         ? 40 Therapeutic Level Cholesterol Diet  Nutrition Goals Re-Evaluation:   Nutrition Goals Discharge (Final Nutrition Goals Re-Evaluation):   Psychosocial: Target Goals: Acknowledge presence or absence of significant depression and/or stress, maximize coping skills, provide positive support system. Participant is able to verbalize types and ability to use techniques and skills needed for reducing stress and depression.   Education: Depression - Provides group verbal and written instruction on the correlation between heart/lung disease and depressed mood, treatment options, and the stigmas associated with seeking treatment.   Pulmonary Rehab from 11/01/2018 in [facility] and Pulmonary Rehab  Date 10/02/18  Educator KC  Instruction Review Code 1- Verbalizes Understanding      Education: Sleep Hygiene -Provides group verbal and written instruction about how sleep can affect your health.  Define sleep hygiene, discuss sleep cycles and impact of sleep habits. Review good sleep hygiene tips.    Education: Stress and Anxiety: - Provides group verbal and written instruction about the health risks of elevated stress and causes of high stress.  Discuss the correlation between heart/lung disease and anxiety and treatment options. Review healthy ways to manage with stress and anxiety.   Pulmonary Rehab from 11/01/2018 in [facility]iac and Pulmonary Rehab  Date 10/30/18  Educator  KC  Instruction Review Code 1- Verbalizes Understanding      Initial Review & Psychosocial Screening:  Initial Psych Review & Screening - 10/15/20 1307      Initial Review   Current issues with Current Sleep Concerns      Family Dynamics   Good Support System? Yes   wife     Barriers   Psychosocial barriers to participate in program There are no identifiable barriers or psychosocial needs.;The patient should benefit from training in stress management and relaxation.      Screening Interventions   Interventions Encouraged to exercise;To provide support and resources with identified psychosocial needs;Provide feedback about the scores to participant    Expected Outcomes Short Term goal: Utilizing psychosocial counselor, staff and physician to assist with identification of specific Stressors or current issues interfering with healing process. Setting desired goal for each stressor or current issue identified.;Long Term Goal: Stressors or current issues are controlled or eliminated.;Short Term goal: Identification and review with participant of any Quality of Life or Depression concerns found by scoring the questionnaire.;Long Term goal: The participant improves quality of Life and PHQ9 Scores as seen by post scores and/or verbalization of changes           Quality of Life Scores:  Scores of 19 and below usually indicate a poorer quality of life in these areas.  A difference of  2-3 points is a clinically meaningful difference.  A difference of 2-3 points in the total score of the Quality of Life Index has been associated with significant improvement in overall quality of life, self-image, physical symptoms, and general health in studies assessing change in quality of life.  PHQ-9: Recent Review Flowsheet Data    Depression screen PHQ 2/9 10/18/2020 07/12/2020 06/14/2020 09/24/2018 05/08/2018   Decreased Interest 2  0 0 0 0   Down, Depressed, Hopeless 0 0   0 0 0   PHQ - 2 Score 2 0 0 0 0    Altered sleeping 2  - - 0 -   Tired, decreased energy 2  - - 1 -   Change in appetite 1 - - 2 -   Feeling bad or failure about yourself  0 - - 0 -   Trouble concentrating 1 - - 0 -   Moving slowly or fidgety/restless 0 - - 0 -   Suicidal thoughts 0 - - 0 -   PHQ-9 Score 8 - - 3 -   Difficult doing work/chores Somewhat difficult - - Not difficult at all -     Interpretation of Total Score  Total Score Depression Severity:  1-4 = Minimal depression, 5-9 = Mild depression, 10-14 = Moderate depression, 15-19 = Moderately severe depression, 20-27 = Severe depression   Psychosocial Evaluation and Intervention:  Psychosocial Evaluation - 10/15/20 1319      Psychosocial Evaluation & Interventions   Interventions Encouraged to exercise with the program and follow exercise prescription    Comments James [name] is a returning patient who hopes to improve his breathing with exercise while here again. His wife is a huge support. This last year has been difficult with Covid restrictions and he is still receiving treatment for his lung cancer. He states he is overall doing as well as can be besides his breathing affecting his sleep some nights.    Expected Outcomes Short: attend pulmonary rehab for educaiton and exercise. Long: maintain positive self care habits.    Continue Psychosocial Services  Follow up required by staff           Psychosocial Re-Evaluation:   Psychosocial Discharge (Final Psychosocial Re-Evaluation):   Education: Education Goals: Education classes will be provided on a weekly basis, covering required topics. Participant will state understanding/return demonstration of topics presented.  Learning Barriers/Preferences:  Learning Barriers/Preferences - 10/15/20 1307      Learning Barriers/Preferences   Learning Barriers Hearing    Learning Preferences None           General Pulmonary Education Topics:  Infection Prevention: - Provides verbal and written material to  individual with discussion of infection control including proper hand washing and proper equipment cleaning during exercise session.   Pulmonary Rehab from 10/18/2020 in [facility] Cardiac and Pulmonary Rehab  Date 10/18/20  Educator AS  Instruction Review Code 1- Verbalizes Understanding      Falls Prevention: - Provides verbal and written material to individual with discussion of falls prevention and safety.   Pulmonary Rehab from 10/18/2020 in [facility] Cardiac and Pulmonary Rehab  Date 10/18/20  Educator AS  Instruction Review Code 1- Verbalizes Understanding      Chronic Lung Diseases: - Group verbal and written instruction to review updates, respiratory medications, advancements in procedures and treatments. Discuss use of supplemental oxygen including available portable oxygen systems, continuous and intermittent flow rates, concentrators, personal use and safety guidelines. Review proper use of inhaler and spacers. Provide informative websites for self-education.    Pulmonary Rehab from 02/21/2017 in [facility] Cardiac and Pulmonary Rehab  Date 01/10/17  Educator LB  Instruction Review Code (retired) 2- meets Chief Financial Officer: - Provide group verbal and written instruction for methods to conserve energy, plan and organize activities. Instruct on pacing techniques, use of adaptive equipment and posture/positioning to relieve shortness of breath.   Triggers and Exacerbations: - Group verbal and written instruction to review types of environmental triggers  and ways to prevent exacerbations. Discuss weather changes, air quality and the benefits of nasal washing. Review warning signs and symptoms to help prevent infections. Discuss techniques for effective airway clearance, coughing, and vibrations.   Pulmonary Rehab from 02/21/2017 in [facility] Cardiac and Pulmonary Rehab  Date 02/07/17  Educator LB  Instruction Review Code (retired) 2- meets goals/outcomes      AED/CPR: -  Group verbal and written instruction with the use of models to demonstrate the basic use of the AED with the basic ABC's of resuscitation.   Anatomy and Physiology of the Lungs: - Group verbal and written instruction with the use of models to provide basic lung anatomy and physiology related to function, structure and complications of lung disease.   Pulmonary Rehab from 11/01/2018 in [facility] Cardiac and Pulmonary Rehab  Date 11/01/18  Educator JH  Instruction Review Code 1- Verbalizes Understanding      Anatomy & Physiology of the Heart: - Group verbal and written instruction and models provide basic cardiac anatomy and physiology, with the coronary electrical and arterial systems. Review of Valvular disease and Heart Failure   Pulmonary Rehab from 02/21/2017 in [facility] Cardiac and Pulmonary Rehab  Date 02/02/17  Educator CE  Instruction Review Code (retired) 2- meets goals/outcomes      Cardiac Medications: - Group verbal and written instruction to review commonly prescribed medications for heart disease. Reviews the medication, class of the drug, and side effects.   Other: -Provides group and verbal instruction on various topics (see comments)   Knowledge Questionnaire Score:  Knowledge Questionnaire Score - 10/18/20 1605      Knowledge Questionnaire Score   Pre Score 15/18 oxygen            Core Components/Risk Factors/Patient Goals at Admission:  Personal Goals and Risk Factors at Admission - 10/18/20 1606      Core Components/Risk Factors/Patient Goals on Admission    Weight Management Yes;Weight Loss    Intervention Weight Management: Develop a combined nutrition and exercise program designed to reach desired caloric intake, while maintaining appropriate intake of nutrient and fiber, sodium and fats, and appropriate energy expenditure required for the weight goal.;Weight Management: Provide education and appropriate resources to help participant work on and attain dietary  goals.;Weight Management/Obesity: Establish reasonable short term and long term weight goals.    Admit Weight 177 lb 8 oz (80.5 kg)    Goal Weight: Short Term 175 lb (79.4 kg)    Goal Weight: Long Term 170 lb (77.1 kg)    Expected Outcomes Short Term: Continue to assess and modify interventions until short term weight is achieved;Long Term: Adherence to nutrition and physical activity/exercise program aimed toward attainment of established weight goal;Weight Loss: Understanding of general recommendations for a balanced deficit meal plan, which promotes 1-2 lb weight loss per week and includes a negative energy balance of 500-1000 kcal/d;Understanding recommendations for meals to include 15-35% energy as protein, 25-35% energy from fat, 35-60% energy from carbohydrates, less than 200mg of dietary cholesterol, 20-35 gm of total fiber daily;Understanding of distribution of calorie intake throughout the day with the consumption of 4-5 meals/snacks    Improve shortness of breath with ADL's Yes    Intervention Provide education, individualized exercise plan and daily activity instruction to help decrease symptoms of SOB with activities of daily living.    Expected Outcomes Short Term: Improve cardiorespiratory fitness to achieve a reduction of symptoms when performing ADLs;Long Term: Be able to perform more ADLs without symptoms or delay   the onset of symptoms    Heart Failure Yes    Intervention Provide a combined exercise and nutrition program that is supplemented with education, support and counseling about heart failure. Directed toward relieving symptoms such as shortness of breath, decreased exercise tolerance, and extremity edema.    Expected Outcomes Improve functional capacity of life;Short term: Attendance in program 2-3 days a week with increased exercise capacity. Reported lower sodium intake. Reported increased fruit and vegetable intake. Reports medication compliance.;Short term: Daily weights  obtained and reported for increase. Utilizing diuretic protocols set by physician.;Long term: Adoption of self-care skills and reduction of barriers for early signs and symptoms recognition and intervention leading to self-care maintenance.    Hypertension Yes    Intervention Provide education on lifestyle modifcations including regular physical activity/exercise, weight management, moderate sodium restriction and increased consumption of fresh fruit, vegetables, and low fat dairy, alcohol moderation, and smoking cessation.;Monitor prescription use compliance.    Lipids Yes    Intervention Provide education and support for participant on nutrition & aerobic/resistive exercise along with prescribed medications to achieve LDL <80m, HDL >43m    Expected Outcomes Short Term: Participant states understanding of desired cholesterol values and is compliant with medications prescribed. Participant is following exercise prescription and nutrition guidelines.;Long Term: Cholesterol controlled with medications as prescribed, with individualized exercise RX and with personalized nutrition plan. Value goals: LDL < 7070mHDL > 40 mg.           Education:Diabetes - Individual verbal and written instruction to review signs/symptoms of diabetes, desired ranges of glucose level fasting, after meals and with exercise. Acknowledge that pre and post exercise glucose checks will be done for 3 sessions at entry of program.   Education: Know Your Numbers and Risk Factors: -Group verbal and written instruction about important numbers in your health.  Discussion of what are risk factors and how they play a role in the disease process.  Review of Cholesterol, Blood Pressure, Diabetes, and BMI and the role they play in your overall health.   Core Components/Risk Factors/Patient Goals Review:    Core Components/Risk Factors/Patient Goals at Discharge (Final Review):    ITP Comments:  ITP Comments    Row Name  10/15/20 1303           ITP Comments Initial telephone encounter completed. [facility]. EP orientation scheduled for 11/1 2:30pm              Comments: initial ITP

## 2020-10-18 NOTE — Patient Instructions (Signed)
Patient Instructions  Patient Details  Name: James [name] MRN: [id number] Date of Birth: 10-Aug-1929 Referring Provider:  Ottie Glazier, MD  Below are your personal goals for exercise, nutrition, and risk factors. Our goal is to help you stay on track towards obtaining and maintaining these goals. We will be discussing your progress on these goals with you throughout the program.  Initial Exercise Prescription:  Initial Exercise Prescription - 10/18/20 1500      Date of Initial Exercise RX and Referring Provider   Date 10/18/20    Referring Provider Aleskerov      Treadmill   MPH 1    Grade 0    Minutes 15    METs 1.77      Recumbant Bike   Level 1    RPM 60    Minutes 15    METs 1.5      NuStep   Level 1    SPM 80    Minutes 15    METs 1.5      Arm Ergometer   Level 1    RPM 30    Minutes 15    METs 1.5      T5 Nustep   Level 1    SPM 80    Minutes 15    METs 1.5      Prescription Details   Frequency (times per week) 3    Duration Progress to 30 minutes of continuous aerobic without signs/symptoms of physical distress      Intensity   THRR 40-80% of Max Heartrate 111-124    Ratings of Perceived Exertion 11-13    Perceived Dyspnea 0-4      Progression   Progression Continue to progress workloads to maintain intensity without signs/symptoms of physical distress.      Resistance Training   Training Prescription Yes    Weight 3 lb    Reps 10-15           Exercise Goals: Frequency: Be able to perform aerobic exercise two to three times per week in program working toward 2-5 days per week of home exercise.  Intensity: Work with a perceived exertion of 11 (fairly light) - 15 (hard) while following your exercise prescription.  We will make changes to your prescription with you as you progress through the program.   Duration: Be able to do 30 to 45 minutes of continuous aerobic exercise in addition to a 5 minute warm-up and a 5 minute cool-down  routine.   Nutrition Goals: Your personal nutrition goals will be established when you do your nutrition analysis with the dietician.  The following are general nutrition guidelines to follow: Cholesterol < 200mg /day Sodium < 1500mg /day Fiber: Men over 50 yrs - 30 grams per day  Personal Goals:  Personal Goals and Risk Factors at Admission - 10/18/20 1606      Core Components/Risk Factors/Patient Goals on Admission    Weight Management Yes;Weight Loss    Intervention Weight Management: Develop a combined nutrition and exercise program designed to reach desired caloric intake, while maintaining appropriate intake of nutrient and fiber, sodium and fats, and appropriate energy expenditure required for the weight goal.;Weight Management: Provide education and appropriate resources to help participant work on and attain dietary goals.;Weight Management/Obesity: Establish reasonable short term and long term weight goals.    Admit Weight 177 lb 8 oz (80.5 kg)    Goal Weight: Short Term 175 lb (79.4 kg)    Goal Weight: Long Term 170 lb (77.1  kg)    Expected Outcomes Short Term: Continue to assess and modify interventions until short term weight is achieved;Long Term: Adherence to nutrition and physical activity/exercise program aimed toward attainment of established weight goal;Weight Loss: Understanding of general recommendations for a balanced deficit meal plan, which promotes 1-2 lb weight loss per week and includes a negative energy balance of [phone] kcal/d;Understanding recommendations for meals to include 15-35% energy as protein, 25-35% energy from fat, 35-60% energy from carbohydrates, less than 200mg  of dietary cholesterol, 20-35 gm of total fiber daily;Understanding of distribution of calorie intake throughout the day with the consumption of 4-5 meals/snacks    Improve shortness of breath with ADL's Yes    Intervention Provide education, individualized exercise plan and daily activity  instruction to help decrease symptoms of SOB with activities of daily living.    Expected Outcomes Short Term: Improve cardiorespiratory fitness to achieve a reduction of symptoms when performing ADLs;Long Term: Be able to perform more ADLs without symptoms or delay the onset of symptoms    Heart Failure Yes    Intervention Provide a combined exercise and nutrition program that is supplemented with education, support and counseling about heart failure. Directed toward relieving symptoms such as shortness of breath, decreased exercise tolerance, and extremity edema.    Expected Outcomes Improve functional capacity of life;Short term: Attendance in program 2-3 days a week with increased exercise capacity. Reported lower sodium intake. Reported increased fruit and vegetable intake. Reports medication compliance.;Short term: Daily weights obtained and reported for increase. Utilizing diuretic protocols set by physician.;Long term: Adoption of self-care skills and reduction of barriers for early signs and symptoms recognition and intervention leading to self-care maintenance.    Hypertension Yes    Intervention Provide education on lifestyle modifcations including regular physical activity/exercise, weight management, moderate sodium restriction and increased consumption of fresh fruit, vegetables, and low fat dairy, alcohol moderation, and smoking cessation.;Monitor prescription use compliance.    Lipids Yes    Intervention Provide education and support for participant on nutrition & aerobic/resistive exercise along with prescribed medications to achieve LDL 70mg , HDL >40mg .    Expected Outcomes Short Term: Participant states understanding of desired cholesterol values and is compliant with medications prescribed. Participant is following exercise prescription and nutrition guidelines.;Long Term: Cholesterol controlled with medications as prescribed, with individualized exercise RX and with personalized  nutrition plan. Value goals: LDL < 70mg , HDL > 40 mg.           Tobacco Use Initial Evaluation: Social History   Tobacco Use  Smoking Status Former Smoker  . Packs/day: 1.00  . Years: 11.00  . Pack years: 11.00  . Types: Cigarettes  . Quit date: 02/12/1984  . Years since quitting: 36.7  Smokeless Tobacco Never Used  Tobacco Comment   quit smoking 30 yrs ago    Exercise Goals and Review:  Exercise Goals    Row Name 10/18/20 1558             Exercise Goals   Increase Physical Activity Yes       Intervention Provide advice, education, support and counseling about physical activity/exercise needs.;Develop an individualized exercise prescription for aerobic and resistive training based on initial evaluation findings, risk stratification, comorbidities and participant's personal goals.       Expected Outcomes Short Term: Attend rehab on a regular basis to increase amount of physical activity.;Long Term: Add in home exercise to make exercise part of routine and to increase amount of physical activity.;Long Term: Exercising regularly  at least 3-5 days a week.       Increase Strength and Stamina Yes       Intervention Provide advice, education, support and counseling about physical activity/exercise needs.;Develop an individualized exercise prescription for aerobic and resistive training based on initial evaluation findings, risk stratification, comorbidities and participant's personal goals.       Expected Outcomes Short Term: Increase workloads from initial exercise prescription for resistance, speed, and METs.;Short Term: Perform resistance training exercises routinely during rehab and add in resistance training at home;Long Term: Improve cardiorespiratory fitness, muscular endurance and strength as measured by increased METs and functional capacity (6MWT)       Able to understand and use rate of perceived exertion (RPE) scale Yes       Intervention Provide education and explanation on  how to use RPE scale       Expected Outcomes Short Term: Able to use RPE daily in rehab to express subjective intensity level;Long Term:  Able to use RPE to guide intensity level when exercising independently       Able to understand and use Dyspnea scale Yes       Intervention Provide education and explanation on how to use Dyspnea scale       Expected Outcomes Short Term: Able to use Dyspnea scale daily in rehab to express subjective sense of shortness of breath during exertion;Long Term: Able to use Dyspnea scale to guide intensity level when exercising independently       Knowledge and understanding of Target Heart Rate Range (THRR) Yes       Intervention Provide education and explanation of THRR including how the numbers were predicted and where they are located for reference       Expected Outcomes Short Term: Able to state/look up THRR;Short Term: Able to use daily as guideline for intensity in rehab;Long Term: Able to use THRR to govern intensity when exercising independently       Able to check pulse independently Yes       Intervention Review the importance of being able to check your own pulse for safety during independent exercise       Expected Outcomes Short Term: Able to explain why pulse checking is important during independent exercise;Long Term: Able to check pulse independently and accurately       Understanding of Exercise Prescription Yes       Intervention Provide education, explanation, and written materials on patient's individual exercise prescription       Expected Outcomes Short Term: Able to explain program exercise prescription;Long Term: Able to explain home exercise prescription to exercise independently              Copy of goals given to participant.

## 2020-10-19 ENCOUNTER — Other Ambulatory Visit: Payer: Self-pay | Admitting: Family Medicine

## 2020-10-19 DIAGNOSIS — J449 Chronic obstructive pulmonary disease, unspecified: Secondary | ICD-10-CM | POA: Diagnosis not present

## 2020-10-19 NOTE — Telephone Encounter (Signed)
Requested Prescriptions  Pending Prescriptions Disp Refills  . montelukast (SINGULAIR) 10 MG tablet [Pharmacy Med Name: MONTELUKAST SODIUM 10 MG TAB] 90 tablet 1    Sig: TAKE 1 TABLET BY MOUTH AT BEDTIME     Pulmonology:  Leukotriene Inhibitors Passed - 10/19/2020 11:43 AM      Passed - Valid encounter within last 12 months    Recent Outpatient Visits          2 months ago Dyspnea on exertion   [facility] Birdie Sons, MD   4 months ago Chronic obstructive pulmonary disease, unspecified COPD type [facility])   [facility] Birdie Sons, MD   7 months ago COPD with acute exacerbation [facility])   [place], [place]   11 months ago Cough   [facility] Birdie Sons, MD   1 year ago COPD exacerbation [facility])   [facility] Birdie Sons, MD      Future Appointments            In 1 month Fisher, Kirstie Peri, MD [facility], [place]

## 2020-10-20 ENCOUNTER — Encounter: Payer: Self-pay | Admitting: *Deleted

## 2020-10-20 DIAGNOSIS — J449 Chronic obstructive pulmonary disease, unspecified: Secondary | ICD-10-CM

## 2020-10-20 NOTE — Progress Notes (Signed)
Pulmonary Individual Treatment Plan  Patient Details  Name: James [name] MRN: [id number] Date of Birth: [birth year] Referring Provider:     Pulmonary Rehab from 10/18/2020 in [facility] Cardiac and Pulmonary Rehab  Referring Provider Aleskerov      Initial Encounter Date:    Pulmonary Rehab from 10/18/2020 in [facility] Cardiac and Pulmonary Rehab  Date 10/18/20      Visit Diagnosis: Chronic obstructive pulmonary disease, unspecified COPD type (HCC)  Patient's Home Medications on Admission:  Current Outpatient Medications:  .  albuterol (VENTOLIN HFA) 108 (90 Base) MCG/ACT inhaler, Inhale 2 puffs into the lungs every 6 (six) hours as needed., Disp: , Rfl:  .  atorvastatin (LIPITOR) 80 MG tablet, TAKE 1 TABLET BY MOUTH DAILY AT 6PM, Disp: 90 tablet, Rfl: 3 .  azelastine (OPTIVAR) 0.05 % ophthalmic solution, APPLY 1 DROP INTO THE EYE TWICE DAILY ASDIRECTED, Disp: 6 mL, Rfl: 5 .  azithromycin (ZITHROMAX) 250 MG tablet, Take 250 mg by mouth 3 (three) times a week., Disp: , Rfl:  .  beta carotene w/minerals (OCUVITE) tablet, Take 1 tablet by mouth daily., Disp: , Rfl:  .  budesonide (PULMICORT) 0.5 MG/2ML nebulizer solution, Take 2 mLs (0.5 mg total) by nebulization 2 (two) times daily. Dx:j44.9, Disp: 75 mL, Rfl: 12 .  chlorpheniramine-HYDROcodone (TUSSIONEX) 10-8 MG/5ML SUER, TAKE 1 TEASPOONFUL EVERY 12 HOURS AS NEEDED FOR COUGH, Disp: 115 mL, Rfl: 0 .  ELIQUIS 5 MG TABS tablet, TAKE 1 TABLET BY MOUTH TWICE DAILY, Disp: 180 tablet, Rfl: 4 .  fluticasone (FLONASE) 50 MCG/ACT nasal spray, Place 2 sprays into both nostrils 2 (two) times daily., Disp: 48 g, Rfl: 3 .  furosemide (LASIX) 40 MG tablet, Take 40 mg by mouth daily., Disp: , Rfl:  .  ipratropium-albuterol (DUONEB) 0.5-2.5 (3) MG/3ML SOLN, Take 3 mLs by nebulization 4 (four) times daily. (Patient not taking: Reported on 07/12/2020), Disp: , Rfl:  .  lansoprazole (PREVACID) 30 MG capsule, TAKE 1 CAPSULE BY MOUTH EVERY DAY, Disp: 90 capsule, Rfl:  3 .  LORazepam (ATIVAN) 1 MG tablet, TAKE 1 TABLET BY MOUTH AT BEDTIME, Disp: 30 tablet, Rfl: 5 .  Magnesium 400 MG CAPS, Take 1 capsule by mouth daily. , Disp: , Rfl:  .  metolazone (ZAROXOLYN) 5 MG tablet, Take 5 mg by mouth daily., Disp: , Rfl:  .  metoprolol tartrate (LOPRESSOR) 50 MG tablet, TAKE ONE TABLET TWICE DAILY, Disp: 60 tablet, Rfl: 0 .  montelukast (SINGULAIR) 10 MG tablet, TAKE 1 TABLET BY MOUTH AT BEDTIME, Disp: 90 tablet, Rfl: 0 .  MULTIPLE VITAMIN PO, Take 1 tablet by mouth daily. , Disp: , Rfl:  .  Omega-3 Fatty Acids (FISH OIL) 1000 MG CAPS, Take 1 capsule daily by mouth., Disp: , Rfl:  .  potassium chloride (KLOR-CON) 10 MEQ tablet, Take 10 mEq by mouth daily., Disp: , Rfl:  .  traZODone (DESYREL) 100 MG tablet, Take 100 mg by mouth at bedtime., Disp: , Rfl:   Past Medical History: Past Medical History:  Diagnosis Date  . Arthritis   . Back pain   . CAD (coronary artery disease)   . CAP (community acquired pneumonia) 11/06/2017  . Cataract    right  . COPD (chronic obstructive pulmonary disease) (HCC)    SPiriva and SYmbicort daily. Albuterol as needed  . Diverticulosis   . Dyspnea    with exertion  . Enlarged prostate    takes Flomax daily  . GERD (gastroesophageal reflux disease)   . History  of chicken pox   . History of gout   . History of measles   . History of mumps   . HOH (hard of hearing)   . Hyperglycemia   . Hyperlipidemia    takes Atorvastatin daily  . Hypertension    takes Metoprolol daily as well as Lotensin HCT  . Hypotension   . Joint pain   . Microscopic colitis   . PAF (paroxysmal atrial fibrillation) (HCC), RVR 03/14/2017  . Pneumonia   . Prostate cancer (HCC)   . Stroke (HCC)    TIA  . TIA (transient ischemic attack)   . Weakness    numbness and tingling.mainly on right    Tobacco Use: Social History   Tobacco Use  Smoking Status Former Smoker  . Packs/day: 1.00  . Years: 11.00  . Pack years: 11.00  . Types: Cigarettes   . Quit date: 02/12/1984  . Years since quitting: 36.7  Smokeless Tobacco Never Used  Tobacco Comment   quit smoking 30 yrs ago    Labs: Recent Review Flowsheet Data    Labs for ITP Cardiac and Pulmonary Rehab Latest Ref Rng & Units 10/05/2014 10/07/2014 10/31/2017 12/03/2018 07/13/2019   Cholestrol 0 - 200 mg/dL - 104 106 99 -   LDLCALC 0 - 99 mg/dL - 40 45 34 -   HDL >40 mg/dL - 52 52 55 -   Trlycerides <150 mg/dL - 61 47 51 -   Hemoglobin A1c 4.8 - 5.6 % 5.7 5.7 6.1(H) - 6.1(H)   HCO3 20.0 - 28.0 mmol/L - - - - 30.1(H)   O2SAT % - - - - 75.8       Pulmonary Assessment Scores:  Pulmonary Assessment Scores    Row Name 10/18/20 1600         ADL UCSD   SOB Score total 59     Rest 1     Walk 3     Stairs 4     Bath 2     Dress 3     Shop 2       CAT Score   CAT Score 19       mMRC Score   mMRC Score 1            UCSD: Self-administered rating of dyspnea associated with activities of daily living (ADLs) 6-point scale (0 = "not at all" to 5 = "maximal or unable to do because of breathlessness")  Scoring Scores range from 0 to 120.  Minimally important difference is 5 units  CAT: CAT can identify the health impairment of COPD patients and is better correlated with disease progression.  CAT has a scoring range of zero to 40. The CAT score is classified into four groups of low (less than 10), medium (10 - 20), high (21-30) and very high (31-40) based on the impact level of disease on health status. A CAT score over 10 suggests significant symptoms.  A worsening CAT score could be explained by an exacerbation, poor medication adherence, poor inhaler technique, or progression of COPD or comorbid conditions.  CAT MCID is 2 points  mMRC: mMRC (Modified Medical Research Council) Dyspnea Scale is used to assess the degree of baseline functional disability in patients of respiratory disease due to dyspnea. No minimal important difference is established. A decrease in score  of 1 point or greater is considered a positive change.   Pulmonary Function Assessment:   Exercise Target Goals: Exercise Program Goal: Individual exercise prescription set using   results from initial 6 min walk test and THRR while considering  patient's activity barriers and safety.   Exercise Prescription Goal: Initial exercise prescription builds to 30-45 minutes a day of aerobic activity, 2-3 days per week.  Home exercise guidelines will be given to patient during program as part of exercise prescription that the participant will acknowledge.  Education: Aerobic Exercise & Resistance Training: - Gives group verbal and written instruction on the various components of exercise. Focuses on aerobic and resistive training programs and the benefits of this training and how to safely progress through these programs..   Pulmonary Rehab from 02/21/2017 in [facility] Cardiac and Pulmonary Rehab  Date 01/17/17  Educator [facility]  Instruction Review Code (retired) 2- meets goals/outcomes      Education: Exercise & Equipment Safety: - Individual verbal instruction and demonstration of equipment use and safety with use of the equipment.   Pulmonary Rehab from 10/18/2020 in [facility] Cardiac and Pulmonary Rehab  Date 10/18/20  Educator AS  Instruction Review Code 1- Verbalizes Understanding      Education: Exercise Physiology & General Exercise Guidelines: - Group verbal and written instruction with models to review the exercise physiology of the cardiovascular system and associated critical values. Provides general exercise guidelines with specific guidelines to those with heart or lung disease.    Pulmonary Rehab from 02/21/2017 in [facility] Cardiac and Pulmonary Rehab  Date 12/20/16  Educator [facility]  Instruction Review Code (retired) 2- meets goals/outcomes      Education: Flexibility, Insurance underwriter, Mind/Body Relaxation: Provides group verbal/written instruction on the benefits of flexibility and balance training,  including mind/body exercise modes such as yoga, pilates and tai chi.  Demonstration and skill practice provided.   Pulmonary Rehab from 11/01/2018 in [facility] Cardiac and Pulmonary Rehab  Date 10/09/18  Educator AS  Instruction Review Code 1- Verbalizes Understanding      Activity Barriers & Risk Stratification:  Activity Barriers & Cardiac Risk Stratification - 10/15/20 1305      Activity Barriers & Cardiac Risk Stratification   Activity Barriers Back Problems;Arthritis;Shortness of Breath           6 Minute Walk:  6 Minute Walk    Row Name 10/18/20 1546         6 Minute Walk   Phase Initial     Distance 495 feet     Walk Time 5.3 minutes     # of Rest Breaks 1     MPH 1.06     METS 1.77     RPE 19     Perceived Dyspnea  3     VO2 Peak 0.6     Symptoms Yes (comment)     Comments shortness of breath     Resting HR 98 bpm     Resting BP 108/52     Resting Oxygen Saturation  95 %     Exercise Oxygen Saturation  during 6 min walk 89 %     Max Ex. HR 110 bpm     Max Ex. BP 116/54     2 Minute Post BP 104/56       Interval HR   1 Minute HR 98     2 Minute HR 104     3 Minute HR 108     4 Minute HR 108     5 Minute HR 109     6 Minute HR 110     2 Minute Post HR 99     Interval Heart  Rate? Yes       Interval Oxygen   Interval Oxygen? Yes     Baseline Oxygen Saturation % 95 %     1 Minute Oxygen Saturation % 93 %     1 Minute Liters of Oxygen 0 L     2 Minute Oxygen Saturation % 90 %     2 Minute Liters of Oxygen 0 L     3 Minute Oxygen Saturation % 90 %     3 Minute Liters of Oxygen 0 L     4 Minute Oxygen Saturation % 93 %     4 Minute Liters of Oxygen 0 L     5 Minute Oxygen Saturation % 93 %     5 Minute Liters of Oxygen 0 L     6 Minute Oxygen Saturation % 91 %     6 Minute Liters of Oxygen 0 L     2 Minute Post Oxygen Saturation % 95 %     2 Minute Post Liters of Oxygen 0 L           Oxygen Initial Assessment:  Oxygen Initial Assessment -  10/15/20 1304      Home Oxygen   Home Oxygen Device Home Concentrator    Sleep Oxygen Prescription Continuous    Liters per minute 2    Home Exercise Oxygen Prescription None    Home Resting Oxygen Prescription None    Compliance with Home Oxygen Use Yes      Initial 6 min Walk   Oxygen Used None      Program Oxygen Prescription   Program Oxygen Prescription None      Intervention   Short Term Goals To learn and demonstrate proper pursed lip breathing techniques or other breathing techniques.;To learn and understand importance of maintaining oxygen saturations>88%;To learn and understand importance of monitoring SPO2 with pulse oximeter and demonstrate accurate use of the pulse oximeter.;To learn and demonstrate proper use of respiratory medications    Long  Term Goals Verbalizes importance of monitoring SPO2 with pulse oximeter and return demonstration;Maintenance of O2 saturations>88%;Exhibits proper breathing techniques, such as pursed lip breathing or other method taught during program session;Compliance with respiratory medication;Demonstrates proper use of MDI's           Oxygen Re-Evaluation:   Oxygen Discharge (Final Oxygen Re-Evaluation):   Initial Exercise Prescription:  Initial Exercise Prescription - 10/18/20 1500      Date of Initial Exercise RX and Referring Provider   Date 10/18/20    Referring Provider Aleskerov      Treadmill   MPH 1    Grade 0    Minutes 15    METs 1.77      Recumbant Bike   Level 1    RPM 60    Minutes 15    METs 1.5      NuStep   Level 1    SPM 80    Minutes 15    METs 1.5      Arm Ergometer   Level 1    RPM 30    Minutes 15    METs 1.5      T5 Nustep   Level 1    SPM 80    Minutes 15    METs 1.5      Prescription Details   Frequency (times per week) 3    Duration Progress to 30 minutes of continuous aerobic without signs/symptoms of physical distress        Intensity   THRR 40-80% of Max Heartrate 111-124     Ratings of Perceived Exertion 11-13    Perceived Dyspnea 0-4      Progression   Progression Continue to progress workloads to maintain intensity without signs/symptoms of physical distress.      Resistance Training   Training Prescription Yes    Weight 3 lb    Reps 10-15           Perform Capillary Blood Glucose checks as needed.  Exercise Prescription Changes:  Exercise Prescription Changes    Row Name 10/18/20 1500             Response to Exercise   Blood Pressure (Admit) 108/52       Blood Pressure (Exercise) 116/54       Blood Pressure (Exit) 104/56       Heart Rate (Admit) 98 bpm       Heart Rate (Exercise) 110 bpm       Heart Rate (Exit) 99 bpm       Oxygen Saturation (Admit) 95 %       Oxygen Saturation (Exercise) 89 %       Oxygen Saturation (Exit) 95 %       Rating of Perceived Exertion (Exercise) 19       Perceived Dyspnea (Exercise) 3       Symptoms SOB              Exercise Comments:   Exercise Goals and Review:  Exercise Goals    Row Name 10/18/20 1558             Exercise Goals   Increase Physical Activity Yes       Intervention Provide advice, education, support and counseling about physical activity/exercise needs.;Develop an individualized exercise prescription for aerobic and resistive training based on initial evaluation findings, risk stratification, comorbidities and participant's personal goals.       Expected Outcomes Short Term: Attend rehab on a regular basis to increase amount of physical activity.;Long Term: Add in home exercise to make exercise part of routine and to increase amount of physical activity.;Long Term: Exercising regularly at least 3-5 days a week.       Increase Strength and Stamina Yes       Intervention Provide advice, education, support and counseling about physical activity/exercise needs.;Develop an individualized exercise prescription for aerobic and resistive training based on initial evaluation findings, risk  stratification, comorbidities and participant's personal goals.       Expected Outcomes Short Term: Increase workloads from initial exercise prescription for resistance, speed, and METs.;Short Term: Perform resistance training exercises routinely during rehab and add in resistance training at home;Long Term: Improve cardiorespiratory fitness, muscular endurance and strength as measured by increased METs and functional capacity (6MWT)       Able to understand and use rate of perceived exertion (RPE) scale Yes       Intervention Provide education and explanation on how to use RPE scale       Expected Outcomes Short Term: Able to use RPE daily in rehab to express subjective intensity level;Long Term:  Able to use RPE to guide intensity level when exercising independently       Able to understand and use Dyspnea scale Yes       Intervention Provide education and explanation on how to use Dyspnea scale       Expected Outcomes Short Term: Able to use Dyspnea scale daily in rehab to express subjective   sense of shortness of breath during exertion;Long Term: Able to use Dyspnea scale to guide intensity level when exercising independently       Knowledge and understanding of Target Heart Rate Range (THRR) Yes       Intervention Provide education and explanation of THRR including how the numbers were predicted and where they are located for reference       Expected Outcomes Short Term: Able to state/look up THRR;Short Term: Able to use daily as guideline for intensity in rehab;Long Term: Able to use THRR to govern intensity when exercising independently       Able to check pulse independently Yes       Intervention Review the importance of being able to check your own pulse for safety during independent exercise       Expected Outcomes Short Term: Able to explain why pulse checking is important during independent exercise;Long Term: Able to check pulse independently and accurately       Understanding of Exercise  Prescription Yes       Intervention Provide education, explanation, and written materials on patient's individual exercise prescription       Expected Outcomes Short Term: Able to explain program exercise prescription;Long Term: Able to explain home exercise prescription to exercise independently              Exercise Goals Re-Evaluation :   Discharge Exercise Prescription (Final Exercise Prescription Changes):  Exercise Prescription Changes - 10/18/20 1500      Response to Exercise   Blood Pressure (Admit) 108/52    Blood Pressure (Exercise) 116/54    Blood Pressure (Exit) 104/56    Heart Rate (Admit) 98 bpm    Heart Rate (Exercise) 110 bpm    Heart Rate (Exit) 99 bpm    Oxygen Saturation (Admit) 95 %    Oxygen Saturation (Exercise) 89 %    Oxygen Saturation (Exit) 95 %    Rating of Perceived Exertion (Exercise) 19    Perceived Dyspnea (Exercise) 3    Symptoms SOB           Nutrition:  Target Goals: Understanding of nutrition guidelines, daily intake of sodium <1500mg, cholesterol <200mg, calories 30% from fat and 7% or less from saturated fats, daily to have 5 or more servings of fruits and vegetables.  Education: Controlling Sodium/Reading Food Labels -Group verbal and written material supporting the discussion of sodium use in heart healthy nutrition. Review and explanation with models, verbal and written materials for utilization of the food label.   Pulmonary Rehab from 02/21/2017 in [facility] Cardiac and Pulmonary Rehab  Date 01/08/17  Educator CR  Instruction Review Code (retired) 2- meets goals/outcomes      Education: General Nutrition Guidelines/Fats and Fiber: -Group instruction provided by verbal, written material, models and posters to present the general guidelines for heart healthy nutrition. Gives an explanation and review of dietary fats and fiber.   Pulmonary Rehab from 11/01/2018 in [facility] Cardiac and Pulmonary Rehab  Date 10/23/18  Educator LB  Instruction  Review Code 1- Verbalizes Understanding      Biometrics:  Pre Biometrics - 10/18/20 1559      Pre Biometrics   Height 5' 3.5" (1.613 m)    Weight 177 lb 8 oz (80.5 kg)    BMI (Calculated) 30.95    Single Leg Stand 2.31 seconds            Nutrition Therapy Plan and Nutrition Goals:   Nutrition Assessments:  Nutrition Assessments - 10/18/20   1606      MEDFICTS Scores   Pre Score 16           MEDIFICTS Score Key:          ?70 Need to make dietary changes          40-70 Heart Healthy Diet         ? 40 Therapeutic Level Cholesterol Diet  Nutrition Goals Re-Evaluation:   Nutrition Goals Discharge (Final Nutrition Goals Re-Evaluation):   Psychosocial: Target Goals: Acknowledge presence or absence of significant depression and/or stress, maximize coping skills, provide positive support system. Participant is able to verbalize types and ability to use techniques and skills needed for reducing stress and depression.   Education: Depression - Provides group verbal and written instruction on the correlation between heart/lung disease and depressed mood, treatment options, and the stigmas associated with seeking treatment.   Pulmonary Rehab from 11/01/2018 in [facility] Cardiac and Pulmonary Rehab  Date 10/02/18  Educator KC  Instruction Review Code 1- Verbalizes Understanding      Education: Sleep Hygiene -Provides group verbal and written instruction about how sleep can affect your health.  Define sleep hygiene, discuss sleep cycles and impact of sleep habits. Review good sleep hygiene tips.    Education: Stress and Anxiety: - Provides group verbal and written instruction about the health risks of elevated stress and causes of high stress.  Discuss the correlation between heart/lung disease and anxiety and treatment options. Review healthy ways to manage with stress and anxiety.   Pulmonary Rehab from 11/01/2018 in [facility] Cardiac and Pulmonary Rehab  Date 10/30/18  Educator  KC  Instruction Review Code 1- Verbalizes Understanding      Initial Review & Psychosocial Screening:  Initial Psych Review & Screening - 10/15/20 1307      Initial Review   Current issues with Current Sleep Concerns      Family Dynamics   Good Support System? Yes   wife     Barriers   Psychosocial barriers to participate in program There are no identifiable barriers or psychosocial needs.;The patient should benefit from training in stress management and relaxation.      Screening Interventions   Interventions Encouraged to exercise;To provide support and resources with identified psychosocial needs;Provide feedback about the scores to participant    Expected Outcomes Short Term goal: Utilizing psychosocial counselor, staff and physician to assist with identification of specific Stressors or current issues interfering with healing process. Setting desired goal for each stressor or current issue identified.;Long Term Goal: Stressors or current issues are controlled or eliminated.;Short Term goal: Identification and review with participant of any Quality of Life or Depression concerns found by scoring the questionnaire.;Long Term goal: The participant improves quality of Life and PHQ9 Scores as seen by post scores and/or verbalization of changes           Quality of Life Scores:  Scores of 19 and below usually indicate a poorer quality of life in these areas.  A difference of  2-3 points is a clinically meaningful difference.  A difference of 2-3 points in the total score of the Quality of Life Index has been associated with significant improvement in overall quality of life, self-image, physical symptoms, and general health in studies assessing change in quality of life.  PHQ-9: Recent Review Flowsheet Data    Depression screen PHQ 2/9 10/18/2020 07/12/2020 06/14/2020 09/24/2018 05/08/2018   Decreased Interest 2  0 0 0 0   Down, Depressed, Hopeless 0 0   0 0 0   PHQ - 2 Score 2 0 0 0 0    Altered sleeping 2  - - 0 -   Tired, decreased energy 2  - - 1 -   Change in appetite 1 - - 2 -   Feeling bad or failure about yourself  0 - - 0 -   Trouble concentrating 1 - - 0 -   Moving slowly or fidgety/restless 0 - - 0 -   Suicidal thoughts 0 - - 0 -   PHQ-9 Score 8 - - 3 -   Difficult doing work/chores Somewhat difficult - - Not difficult at all -     Interpretation of Total Score  Total Score Depression Severity:  1-4 = Minimal depression, 5-9 = Mild depression, 10-14 = Moderate depression, 15-19 = Moderately severe depression, 20-27 = Severe depression   Psychosocial Evaluation and Intervention:  Psychosocial Evaluation - 10/15/20 1319      Psychosocial Evaluation & Interventions   Interventions Encouraged to exercise with the program and follow exercise prescription    Comments Macguire is a returning patient who hopes to improve his breathing with exercise while here again. His wife is a huge support. This last year has been difficult with Covid restrictions and he is still receiving treatment for his lung cancer. He states he is overall doing as well as can be besides his breathing affecting his sleep some nights.    Expected Outcomes Short: attend pulmonary rehab for educaiton and exercise. Long: maintain positive self care habits.    Continue Psychosocial Services  Follow up required by staff           Psychosocial Re-Evaluation:   Psychosocial Discharge (Final Psychosocial Re-Evaluation):   Education: Education Goals: Education classes will be provided on a weekly basis, covering required topics. Participant will state understanding/return demonstration of topics presented.  Learning Barriers/Preferences:  Learning Barriers/Preferences - 10/15/20 1307      Learning Barriers/Preferences   Learning Barriers Hearing    Learning Preferences None           General Pulmonary Education Topics:  Infection Prevention: - Provides verbal and written material to  individual with discussion of infection control including proper hand washing and proper equipment cleaning during exercise session.   Pulmonary Rehab from 10/18/2020 in [facility] Cardiac and Pulmonary Rehab  Date 10/18/20  Educator AS  Instruction Review Code 1- Verbalizes Understanding      Falls Prevention: - Provides verbal and written material to individual with discussion of falls prevention and safety.   Pulmonary Rehab from 10/18/2020 in [facility] Cardiac and Pulmonary Rehab  Date 10/18/20  Educator AS  Instruction Review Code 1- Verbalizes Understanding      Chronic Lung Diseases: - Group verbal and written instruction to review updates, respiratory medications, advancements in procedures and treatments. Discuss use of supplemental oxygen including available portable oxygen systems, continuous and intermittent flow rates, concentrators, personal use and safety guidelines. Review proper use of inhaler and spacers. Provide informative websites for self-education.    Pulmonary Rehab from 02/21/2017 in [facility]  Date 01/10/17  Educator LB  Instruction Review Code (retired) 2- meets Chief Financial Officer: - Provide group verbal and written instruction for methods to conserve energy, plan and organize activities. Instruct on pacing techniques, use of adaptive equipment and posture/positioning to relieve shortness of breath.   Triggers and Exacerbations: - Group verbal and written instruction to review types of environmental triggers  and ways to prevent exacerbations. Discuss weather changes, air quality and the benefits of nasal washing. Review warning signs and symptoms to help prevent infections. Discuss techniques for effective airway clearance, coughing, and vibrations.   Pulmonary Rehab from 02/21/2017 in [facility] Pulmonary Re[facility]  Date 02/07/17  Educator LB  Instruction Review Code (retired) 2- meets goals/outcomes      AED/CPR: -  Group verbal and written instruction with the use of models to demonstrate the basic use of the AED with the basic ABC's of resuscitation.   Anatomy and Physiology of the Lungs: - Group verbal and written instruction with the use of models to provide basic lung anatomy and physiology related to function, structure and complications of lung disease.   Pulmonary Rehab from 11/01/2018 in [facility]iac and Pulmonary Rehab  Date 11/01/18  Educator JH  Instruction Review Code 1- Verbalizes Understanding      Anatomy & Physiology of the Heart: - Group verbal and written instruction and models provide basic cardiac anatomy and physiology, with the coronary electrical and arterial systems. Review of Valvular disease and Heart Failure   Pulmonary Rehab from 02/21/2017 in [facility] Cardiac and Pulmonary Rehab  Date 02/02/17  Educator CE  Instruction Review Code (retired) 2- meets goals/outcomes      Cardiac Medications: - Group verbal and written instruction to review commonly prescribed medications for heart disease. Reviews the medication, class of the drug, and side effects.   Other: -Provides group and verbal instruction on various topics (see comments)   Knowledge Questionnaire Score:  Knowledge Questionnaire Score - 10/18/20 1605      Knowledge Questionnaire Score   Pre Score 15/18 oxygen            Core Components/Risk Factors/Patient Goals at Admission:  Personal Goals and Risk Factors at Admission - 10/18/20 1606      Core Components/Risk Factors/Patient Goals on Admission    Weight Management Yes;Weight Loss    Intervention Weight Management: Develop a combined nutrition and exercise program designed to reach desired caloric intake, while maintaining appropriate intake of nutrient and fiber, sodium and fats, and appropriate energy expenditure required for the weight goal.;Weight Management: Provide education and appropriate resources to help participant work on and attain dietary  goals.;Weight Management/Obesity: Establish reasonable short term and long term weight goals.    Admit Weight 177 lb 8 oz (80.5 kg)    Goal Weight: Short Term 175 lb (79.4 kg)    Goal Weight: Long Term 170 lb (77.1 kg)    Expected Outcomes Short Term: Continue to assess and modify interventions until short term weight is achieved;Long Term: Adherence to nutrition and physical activity/exercise program aimed toward attainment of established weight goal;Weight Loss: Understanding of general recommendations for a balanced deficit meal plan, which promotes 1-2 lb weight loss per week and includes a negative energy balance of 500-1000 kcal/d;Understanding recommendations for meals to include 15-35% energy as protein, 25-35% energy from fat, 35-60% energy from carbohydrates, less than 200mg of dietary cholesterol, 20-35 gm of total fiber daily;Understanding of distribution of calorie intake throughout the day with the consumption of 4-5 meals/snacks    Improve shortness of breath with ADL's Yes    Intervention Provide education, individualized exercise plan and daily activity instruction to help decrease symptoms of SOB with activities of daily living.    Expected Outcomes Short Term: Improve cardiorespiratory fitness to achieve a reduction of symptoms when performing ADLs;Long Term: Be able to perform more ADLs without symptoms or delay   the onset of symptoms    Heart Failure Yes    Intervention Provide a combined exercise and nutrition program that is supplemented with education, support and counseling about heart failure. Directed toward relieving symptoms such as shortness of breath, decreased exercise tolerance, and extremity edema.    Expected Outcomes Improve functional capacity of life;Short term: Attendance in program 2-3 days a week with increased exercise capacity. Reported lower sodium intake. Reported increased fruit and vegetable intake. Reports medication compliance.;Short term: Daily weights  obtained and reported for increase. Utilizing diuretic protocols set by physician.;Long term: Adoption of self-care skills and reduction of barriers for early signs and symptoms recognition and intervention leading to self-care maintenance.    Hypertension Yes    Intervention Provide education on lifestyle modifcations including regular physical activity/exercise, weight management, moderate sodium restriction and increased consumption of fresh fruit, vegetables, and low fat dairy, alcohol moderation, and smoking cessation.;Monitor prescription use compliance.    Lipids Yes    Intervention Provide education and support for participant on nutrition & aerobic/resistive exercise along with prescribed medications to achieve LDL '70mg'$ , HDL >$Remo'40mg'NsVVp$ .    Expected Outcomes Short Term: Participant states understanding of desired cholesterol values and is compliant with medications prescribed. Participant is following exercise prescription and nutrition guidelines.;Long Term: Cholesterol controlled with medications as prescribed, with individualized exercise RX and with personalized nutrition plan. Value goals: LDL < $Rem'70mg'GiTF$ , HDL > 40 mg.           Education:Diabetes - Individual verbal and written instruction to review signs/symptoms of diabetes, desired ranges of glucose level fasting, after meals and with exercise. Acknowledge that pre and post exercise glucose checks will be done for 3 sessions at entry of program.   Education: Know Your Numbers and Risk Factors: -Group verbal and written instruction about important numbers in your health.  Discussion of what are risk factors and how they play a role in the disease process.  Review of Cholesterol, Blood Pressure, Diabetes, and BMI and the role they play in your overall health.   Core Components/Risk Factors/Patient Goals Review:    Core Components/Risk Factors/Patient Goals at Discharge (Final Review):    ITP Comments:  ITP Comments    Row Name  10/15/20 1303 10/18/20 1611 10/20/20 0704       ITP Comments Initial telephone encounter completed. Diagnosis can be found in [facility] 10/7. EP orientation scheduled for 11/1 2:30pm Completed 6MWT and gym orientation. Initial ITP created and sent for review to Dr. Emily Filbert, Medical Director. 30 Day review completed. Medical Director ITP review done, changes made as directed, and signed approval by Medical Director.            Comments:

## 2020-10-25 ENCOUNTER — Telehealth: Payer: Self-pay | Admitting: *Deleted

## 2020-10-25 ENCOUNTER — Ambulatory Visit: Payer: Medicare Other | Admitting: Adult Health

## 2020-10-25 ENCOUNTER — Encounter: Payer: Self-pay | Admitting: Adult Health

## 2020-10-25 ENCOUNTER — Other Ambulatory Visit: Payer: Self-pay

## 2020-10-25 ENCOUNTER — Encounter: Payer: Self-pay | Admitting: *Deleted

## 2020-10-25 VITALS — BP 120/56 | HR 80 | Temp 98.4°F | Resp 15 | Wt 179.8 lb

## 2020-10-25 DIAGNOSIS — J449 Chronic obstructive pulmonary disease, unspecified: Secondary | ICD-10-CM

## 2020-10-25 DIAGNOSIS — J441 Chronic obstructive pulmonary disease with (acute) exacerbation: Secondary | ICD-10-CM

## 2020-10-25 DIAGNOSIS — R601 Generalized edema: Secondary | ICD-10-CM

## 2020-10-25 DIAGNOSIS — M109 Gout, unspecified: Secondary | ICD-10-CM

## 2020-10-25 DIAGNOSIS — I5022 Chronic systolic (congestive) heart failure: Secondary | ICD-10-CM | POA: Diagnosis not present

## 2020-10-25 DIAGNOSIS — R0989 Other specified symptoms and signs involving the circulatory and respiratory systems: Secondary | ICD-10-CM

## 2020-10-25 DIAGNOSIS — M79672 Pain in left foot: Secondary | ICD-10-CM

## 2020-10-25 MED ORDER — PREDNISONE 10 MG (21) PO TBPK: ORAL_TABLET | ORAL | 0 refills | Status: DC

## 2020-10-25 MED ORDER — AMOXICILLIN-POT CLAVULANATE 875-125 MG PO TABS: 1.0000 | ORAL_TABLET | Freq: Two times a day (BID) | ORAL | 0 refills | Status: DC

## 2020-10-25 NOTE — Telephone Encounter (Signed)
James [name] called to let us know that he will be out this week with a gout flare up.

## 2020-10-25 NOTE — Patient Instructions (Addendum)
Chronic Obstructive Pulmonary Disease Exacerbation Chronic obstructive pulmonary disease (COPD) is a long-term (chronic) lung problem. In COPD, the flow of air from the lungs is limited. COPD exacerbations are times that breathing gets worse and you need more than your normal treatment. Without treatment, they can be life threatening. If they happen often, your lungs can become more damaged. If your COPD gets worse, your doctor may treat you with:  Medicines.  Oxygen.  Different ways to clear your airway, such as using a mask. Follow these instructions at home: Medicines  Take over-the-counter and prescription medicines only as told by your doctor.  If you take an antibiotic or steroid medicine, do not stop taking the medicine even if you start to feel better.  Keep up with shots (vaccinations) as told by your doctor. Be sure to get a yearly (annual) flu shot. Lifestyle  Do not smoke. If you need help quitting, ask your doctor.  Eat healthy foods.  Exercise regularly.  Get plenty of sleep.  Avoid tobacco smoke and other things that can bother your lungs.  Wash your hands often with soap and water. This will help keep you from getting an infection. If you cannot use soap and water, use hand sanitizer.  During flu season, avoid areas that are crowded with people. General instructions  Drink enough fluid to keep your pee (urine) clear or pale yellow. Do not do this if your doctor has told you not to.  Use a cool mist machine (vaporizer).  If you use oxygen or a machine that turns medicine into a mist (nebulizer), continue to use it as told.  Follow all instructions for rehabilitation. These are steps you can take to make your body work better.  Keep all follow-up visits as told by your doctor. This is important. Contact a doctor if:  Your COPD symptoms get worse than normal. Get help right away if:  You are short of breath and it gets worse.  You have trouble  talking.  You have chest pain.  You cough up blood.  You have a fever.  You keep throwing up (vomiting).  You feel weak or you pass out (faint).  You feel confused.  You are not able to sleep because of your symptoms.  You are not able to do daily activities. Summary  COPD exacerbations are times that breathing gets worse and you need more treatment than normal.  COPD exacerbations can be very serious and may cause your lungs to become more damaged.  Do not smoke. If you need help quitting, ask your doctor.  Stay up-to-date on your shots. Get a flu shot every year. This information is not intended to replace advice given to you by your health care provider. Make sure you discuss any questions you have with your health care provider. Document Revised: 11/16/2017 Document Reviewed: 01/08/2017 Elsevier Patient Education  [place]. Edema  Edema is when you have too much fluid in your body or under your skin. Edema may make your legs, feet, and ankles swell up. Swelling is also common in looser tissues, like around your eyes. This is a common condition. It gets more common as you get older. There are many possible causes of edema. Eating too much salt (sodium) and being on your feet or sitting for a long time can cause edema in your legs, feet, and ankles. Hot weather may make edema worse. Edema is usually painless. Your skin may look swollen or shiny. Follow these instructions at home:  Keep the swollen body part raised (elevated) above the level of your heart when you are sitting or lying down.  Do not sit still or stand for a long time.  Do not wear tight clothes. Do not wear garters on your upper legs.  Exercise your legs. This can help the swelling go down.  Wear elastic bandages or support stockings as told by your doctor.  Eat a low-salt (low-sodium) diet to reduce fluid as told by your doctor.  Depending on the cause of your swelling, you may need to limit  how much fluid you drink (fluid restriction).  Take over-the-counter and prescription medicines only as told by your doctor. Contact a doctor if:  Treatment is not working.  You have heart, liver, or kidney disease and have symptoms of edema.  You have sudden and unexplained weight gain. Get help right away if:  You have shortness of breath or chest pain.  You cannot breathe when you lie down.  You have pain, redness, or warmth in the swollen areas.  You have heart, liver, or kidney disease and get edema all of a sudden.  You have a fever and your symptoms get worse all of a sudden. Summary  Edema is when you have too much fluid in your body or under your skin.  Edema may make your legs, feet, and ankles swell up. Swelling is also common in looser tissues, like around your eyes.  Raise (elevate) the swollen body part above the level of your heart when you are sitting or lying down.  Follow your doctor's instructions about diet and how much fluid you can drink (fluid restriction). This information is not intended to replace advice given to you by your health care provider. Make sure you discuss any questions you have with your health care provider. Document Revised: 12/07/2017 Document Reviewed: 12/22/2016 Elsevier Patient Education  [place]. Gout  Gout is painful swelling of your joints. Gout is a type of arthritis. It is caused by having too much uric acid in your body. Uric acid is a chemical that is made when your body breaks down substances called purines. If your body has too much uric acid, sharp crystals can form and build up in your joints. This causes pain and swelling. Gout attacks can happen quickly and be very painful (acute gout). Over time, the attacks can affect more joints and happen more often (chronic gout). What are the causes?  Too much uric acid in your blood. This can happen because: ? Your kidneys do not remove enough uric acid from your  blood. ? Your body makes too much uric acid. ? You eat too many foods that are high in purines. These foods include organ meats, some seafood, and beer.  Trauma or stress. What increases the risk?  Having a family history of gout.  Being male and middle-aged.  Being male and having gone through menopause.  Being very overweight (obese).  Drinking alcohol, especially beer.  Not having enough water in the body (being dehydrated).  Losing weight too quickly.  Having an organ transplant.  Having lead poisoning.  Taking certain medicines.  Having kidney disease.  Having a skin condition called psoriasis. What are the signs or symptoms? An attack of acute gout usually happens in just one joint. The most common place is the big toe. Attacks often start at night. Other joints that may be affected include joints of the feet, ankle, knee, fingers, wrist, or elbow. Symptoms of an attack  may include:  Very bad pain.  Warmth.  Swelling.  Stiffness.  Shiny, red, or purple skin.  Tenderness. The affected joint may be very painful to touch.  Chills and fever. Chronic gout may cause symptoms more often. More joints may be involved. You may also have white or yellow lumps (tophi) on your hands or feet or in other areas near your joints. How is this treated?  Treatment for this condition has two phases: treating an acute attack and preventing future attacks.  Acute gout treatment may include: ? NSAIDs. ? Steroids. These are taken by mouth or injected into a joint. ? Colchicine. This medicine relieves pain and swelling. It can be given by mouth or through an IV tube.  Preventive treatment may include: ? Taking small doses of NSAIDs or colchicine daily. ? Using a medicine that reduces uric acid levels in your blood. ? Making changes to your diet. You may need to see a food expert (dietitian) about what to eat and drink to prevent gout. Follow these instructions at  home: During a gout attack   If told, put ice on the painful area: ? Put ice in a plastic bag. ? Place a towel between your skin and the bag. ? Leave the ice on for 20 minutes, 2-3 times a day.  Raise (elevate) the painful joint above the level of your heart as often as you can.  Rest the joint as much as possible. If the joint is in your leg, you may be given crutches.  Follow instructions from your doctor about what you cannot eat or drink. Avoiding future gout attacks  Eat a low-purine diet. Avoid foods and drinks such as: ? Liver. ? Kidney. ? Anchovies. ? Asparagus. ? Herring. ? Mushrooms. ? Mussels. ? Beer.  Stay at a healthy weight. If you want to lose weight, talk with your doctor. Do not lose weight too fast.  Start or continue an exercise plan as told by your doctor. Eating and drinking  Drink enough fluids to keep your pee (urine) pale yellow.  If you drink alcohol: ? Limit how much you use to:  0-1 drink a day for women.  0-2 drinks a day for men. ? Be aware of how much alcohol is in your drink. In the [place] one drink equals one 12 oz bottle of beer (355 mL), one 5 oz glass of wine (148 mL), or one 1 oz glass of hard liquor (44 mL). General instructions  Take over-the-counter and prescription medicines only as told by your doctor.  Do not drive or use heavy machinery while taking prescription pain medicine.  Return to your normal activities as told by your doctor. Ask your doctor what activities are safe for you.  Keep all follow-up visits as told by your doctor. This is important. Contact a doctor if:  You have another gout attack.  You still have symptoms of a gout attack after 10 days of treatment.  You have problems (side effects) because of your medicines.  You have chills or a fever.  You have burning pain when you pee (urinate).  You have pain in your lower back or belly. Get help right away if:  You have very bad pain.  Your pain  cannot be controlled.  You cannot pee. Summary  Gout is painful swelling of the joints.  The most common site of pain is the big toe, but it can affect other joints.  Medicines and avoiding some foods can help to prevent  and treat gout attacks. This information is not intended to replace advice given to you by your health care provider. Make sure you discuss any questions you have with your health care provider. Document Revised: 06/26/2018 Document Reviewed: 06/26/2018 Elsevier Patient Education  [place].

## 2020-10-25 NOTE — Progress Notes (Signed)
Established patient visit   Patient: James [name]   DOB: [birth year]   86 y.o. Male  MRN: [id number] Visit Date: 10/25/2020  Today's healthcare provider: Marcille Buffy, FNP   Chief Complaint  Patient presents with  . Foot Pain   Subjective    Foot Injury  There was no injury mechanism. The pain is present in the left foot. The quality of the pain is described as burning. The pain is mild. The pain has been constant since onset. Associated symptoms include numbness and tingling. Pertinent negatives include no inability to bear weight, loss of motion, loss of sensation or muscle weakness.    He reports big toe left foot pain, swelling and tender. He has history of gout in past. Onset Friday. Denies any injury.   Patient  denies any fever,chills, rash, chest pain, shortness of breath, nausea, vomiting, or diarrhea.   Denies dizziness, lightheadedness, pre syncopal or syncopal episodes.     Past Medical History:  Diagnosis Date  . Arthritis   . Back pain   . CAD (coronary artery disease)   . CAP (community acquired pneumonia) 11/06/2017  . Cataract    right  . COPD (chronic obstructive pulmonary disease) ([place])    SPiriva and SYmbicort daily. Albuterol as needed  . Diverticulosis   . Dyspnea    with exertion  . Enlarged prostate    takes Flomax daily  . GERD (gastroesophageal reflux disease)   . History of chicken pox   . History of gout   . History of measles   . History of mumps   . HOH (hard of hearing)   . Hyperglycemia   . Hyperlipidemia    takes Atorvastatin daily  . Hypertension    takes Metoprolol daily as well as Lotensin HCT  . Hypotension   . Joint pain   . Microscopic colitis   . PAF (paroxysmal atrial fibrillation) ([place]), RVR 03/14/2017  . Pneumonia   . Prostate cancer ([place])   . Stroke [facility])    TIA  . TIA (transient ischemic attack)   . Weakness    numbness and tingling.mainly on right   Past Surgical History:  Procedure Laterality  Date  . ANTERIOR CERVICAL DECOMP/DISCECTOMY FUSION N/A 03/07/2017   Procedure: ANTERIOR CERVICAL DECOMPRESSION/DISCECTOMY FUSION CERVICAL THREE- CERVICAL FOUR, CERVICAL FOUR- CERVICAL FIVE;  Surgeon: Newman Pies, MD;  Location: [facility];  Service: Neurosurgery;  Laterality: N/A;  ANTERIOR CERVICAL DECOMPRESSION/DISCECTOMY FUSION CERVICAL 3- CERVICAL 4, CERVIACL 4- CERVICAL 5  . APPENDECTOMY  1950  . CARDIAC CATHETERIZATION  05/29/2013   EF=40-45%. Moderate pulmonary hypertension. Infero/ lateral hypokinesis  . cataract surgery Left   . COLONOSCOPY    . CORONARY ARTERY BYPASS GRAFT  2011   x 5  . ESOPHAGOGASTRODUODENOSCOPY (EGD) WITH PROPOFOL N/A 07/01/2018   Procedure: ESOPHAGOGASTRODUODENOSCOPY (EGD) WITH PROPOFOL;  Surgeon: Lin Landsman, MD;  Location: [facility];  Service: Gastroenterology;  Laterality: N/A;  . EYE SURGERY    . FLEXIBLE BRONCHOSCOPY N/A 11/19/2017   Procedure: FLEXIBLE BRONCHOSCOPY;  Surgeon: Laverle Hobby, MD;  Location: [facility];  Service: Pulmonary;  Laterality: N/A;  . MRI BRAIN  06/07/2013   Mild chronic involutional changes. No acute abnormalities  . MRI of neck    . Myocardial Perfusion Scan  05/29/2013   Abnormal myocardial perfusion image consistent with myocardial infarction  . PROSTATE SURGERY  11/18/2012   radiation seed  . TOTAL HIP ARTHROPLASTY Left 1997   hip joint replacement  Medications: Outpatient Medications Prior to Visit  Medication Sig  . albuterol (VENTOLIN HFA) 108 (90 Base) MCG/ACT inhaler Inhale 2 puffs into the lungs every 6 (six) hours as needed.  [name] atorvastatin (LIPITOR) 80 MG tablet TAKE 1 TABLET BY MOUTH DAILY AT 6PM  . azelastine (OPTIVAR) 0.05 % ophthalmic solution APPLY 1 DROP INTO THE EYE TWICE DAILY ASDIRECTED  . azithromycin (ZITHROMAX) 250 MG tablet Take 250 mg by mouth 3 (three) times a week.  . beta carotene w/minerals (OCUVITE) tablet Take 1 tablet by mouth daily.  . budesonide (PULMICORT) 0.5  MG/2ML nebulizer solution Take 2 mLs (0.5 mg total) by nebulization 2 (two) times daily. Dx:j44.9  . chlorpheniramine-HYDROcodone (TUSSIONEX) 10-8 MG/5ML SUER TAKE 1 TEASPOONFUL EVERY 12 HOURS AS NEEDED FOR COUGH  . ELIQUIS 5 MG TABS tablet TAKE 1 TABLET BY MOUTH TWICE DAILY  . fluticasone (FLONASE) 50 MCG/ACT nasal spray Place 2 sprays into both nostrils 2 (two) times daily.  . furosemide (LASIX) 40 MG tablet Take 40 mg by mouth daily.  [name] ipratropium-albuterol (DUONEB) 0.5-2.5 (3) MG/3ML SOLN Take 3 mLs by nebulization 4 (four) times daily.   . lansoprazole (PREVACID) 30 MG capsule TAKE 1 CAPSULE BY MOUTH EVERY DAY  . LORazepam (ATIVAN) 1 MG tablet TAKE 1 TABLET BY MOUTH AT BEDTIME  . Magnesium 400 MG CAPS Take 1 capsule by mouth daily.   . metolazone (ZAROXOLYN) 5 MG tablet Take 5 mg by mouth daily.  . metoprolol tartrate (LOPRESSOR) 50 MG tablet TAKE ONE TABLET TWICE DAILY  . montelukast (SINGULAIR) 10 MG tablet TAKE 1 TABLET BY MOUTH AT BEDTIME  . MULTIPLE VITAMIN PO Take 1 tablet by mouth daily.   . Omega-3 Fatty Acids (FISH OIL) 1000 MG CAPS Take 1 capsule daily by mouth.  . potassium chloride (KLOR-CON) 10 MEQ tablet Take 10 mEq by mouth daily.  . tamsulosin (FLOMAX) 0.4 MG CAPS capsule Take 0.4 mg by mouth daily.  . traZODone (DESYREL) 100 MG tablet Take 100 mg by mouth at bedtime.   No facility-administered medications prior to visit.    Review of Systems  Constitutional: Negative.   HENT: Negative.   Respiratory: Positive for shortness of breath. Negative for apnea, cough, choking, chest tightness, wheezing and stridor.   Cardiovascular: Negative.   Gastrointestinal: Negative.   Genitourinary: Negative.   Musculoskeletal: Positive for arthralgias (left foot pain, great toe). Negative for back pain, gait problem, joint swelling, neck pain and neck stiffness.  Neurological: Positive for tingling and numbness. Negative for dizziness, tremors, seizures, syncope, facial asymmetry,  speech difficulty, weakness, light-headedness and headaches.  Psychiatric/Behavioral: Negative.       Objective    BP (!) 120/56   Pulse 80   Temp 98.4 F (36.9 C) (Oral)   Resp 15   Wt 179 lb 12.8 oz (81.6 kg)   BMI 31.35 kg/m  BP Readings from Last 3 Encounters:  10/25/20 (!) 120/56  08/09/20 111/69  06/14/20 (!) 112/52   Wt Readings from Last 3 Encounters:  10/25/20 179 lb 12.8 oz (81.6 kg)  10/18/20 177 lb 8 oz (80.5 kg)  08/09/20 170 lb (77.1 kg)      Physical Exam Vitals reviewed.  Constitutional:      General: He is not in acute distress.    Appearance: Normal appearance. He is not ill-appearing, toxic-appearing or diaphoretic.  HENT:     Head: Normocephalic and atraumatic.     Right Ear: Tympanic membrane, ear canal and external ear normal. There is no  impacted cerumen.     Left Ear: Tympanic membrane, ear canal and external ear normal. There is no impacted cerumen.     Nose: Nose normal. No congestion or rhinorrhea.     Mouth/Throat:     Mouth: Mucous membranes are moist.     Pharynx: No oropharyngeal exudate or posterior oropharyngeal erythema.  Eyes:     General: No scleral icterus.       Right eye: No discharge.        Left eye: No discharge.     Conjunctiva/sclera: Conjunctivae normal.  Neck:     Vascular: No carotid bruit.  Cardiovascular:     Rate and Rhythm: Normal rate and regular rhythm.     Pulses: Normal pulses.     Heart sounds: Normal heart sounds. No murmur heard.  No friction rub. No gallop.   Pulmonary:     Effort: No respiratory distress.     Breath sounds: No stridor. Examination of the right-upper field reveals wheezing. Examination of the left-upper field reveals wheezing. Wheezing (mild expiratory wheeze) present. No rhonchi or rales.  Chest:     Chest wall: No tenderness.  Abdominal:     General: There is no distension.     Palpations: Abdomen is soft.     Tenderness: There is no abdominal tenderness.  Musculoskeletal:         General: Normal range of motion.     Cervical back: Normal range of motion and neck supple. No rigidity or tenderness.  Lymphadenopathy:     Cervical: No cervical adenopathy.  Skin:    General: Skin is warm.     Coloration: Skin is not jaundiced or pale.     Findings: No bruising, erythema, lesion or rash.  Neurological:     General: No focal deficit present.     Mental Status: He is alert and oriented to person, place, and time.     Motor: No weakness.     Gait: Gait normal.  Psychiatric:        Mood and Affect: Mood normal.        Behavior: Behavior normal.        Thought Content: Thought content normal.        Judgment: Judgment normal.      Results for orders placed or performed in visit on 10/25/20  Comprehensive Metabolic Panel (CMET)  Result Value Ref Range   Glucose 122 (H) 65 - 99 mg/dL   BUN 16 8 - 27 mg/dL   Creatinine, Ser 1.13 0.76 - 1.27 mg/dL   GFR calc non Af Amer 57 (L) >59 mL/min/1.73   GFR calc Af Amer 66 >59 mL/min/1.73   BUN/Creatinine Ratio 14 10 - 24   Sodium 140 134 - 144 mmol/L   Potassium 4.7 3.5 - 5.2 mmol/L   Chloride 98 96 - 106 mmol/L   CO2 29 20 - 29 mmol/L   Calcium 9.3 8.6 - 10.2 mg/dL   Total Protein 6.7 6.0 - 8.5 g/dL   Albumin 4.2 3.6 - 4.6 g/dL   Globulin, Total 2.5 1.5 - 4.5 g/dL   Albumin/Globulin Ratio 1.7 1.2 - 2.2   Bilirubin Total 0.6 0.0 - 1.2 mg/dL   Alkaline Phosphatase 126 (H) 44 - 121 IU/L   AST 56 (H) 0 - 40 IU/L   ALT 49 (H) 0 - 44 IU/L  CBC with Differential/Platelet  Result Value Ref Range   WBC 6.9 3.4 - 10.8 x10E3/uL   RBC 4.50 4.14 - 5.80  x10E6/uL   Hemoglobin 13.1 13.0 - 17.7 g/dL   Hematocrit 40.0 37.5 - 51.0 %   MCV 89 79 - 97 fL   MCH 29.1 26.6 - 33.0 pg   MCHC 32.8 31 - 35 g/dL   RDW 13.3 11.6 - 15.4 %   Platelets 178 150 - 450 x10E3/uL   Neutrophils 85 Not Estab. %   Lymphs 11 Not Estab. %   Monocytes 4 Not Estab. %   Eos 0 Not Estab. %   Basos 0 Not Estab. %   Neutrophils Absolute 5.7 1.40 - 7.00  x10E3/uL   Lymphocytes Absolute 0.8 0 - 3 x10E3/uL   Monocytes Absolute 0.3 0 - 0 x10E3/uL   EOS (ABSOLUTE) 0.0 0.0 - 0.4 x10E3/uL   Basophils Absolute 0.0 0 - 0 x10E3/uL   Immature Granulocytes 0 Not Estab. %   Immature Grans (Abs) 0.0 0.0 - 0.1 x10E3/uL  B Nat Peptide  Result Value Ref Range   BNP 231.2 (H) 0.0 - 100.0 pg/mL  Uric acid  Result Value Ref Range   Uric Acid 5.2 3.8 - 8.4 mg/dL    Assessment & Plan     COPD exacerbation (HCC)  Left foot pain - Plan: Comprehensive Metabolic Panel (CMET), CBC with Differential/Platelet, predniSONE (STERAPRED UNI-PAK 21 TAB) 10 MG (21) TBPK tablet, DG Foot Complete Left, CANCELED: Uric acid  Generalized edema - Plan: Comprehensive Metabolic Panel (CMET), CBC with Differential/Platelet, B Nat Peptide  Chronic systolic heart failure (HCC) - Plan: DG Chest 2 View  Gouty arthritis of left great toe - Plan: predniSONE (STERAPRED UNI-PAK 21 TAB) 10 MG (21) TBPK tablet, Uric acid  Chest congestion - Plan: amoxicillin-clavulanate (AUGMENTIN) 875-125 MG tablet  Chronic obstructive pulmonary disease, unspecified COPD type (HCC)   Meds ordered this encounter  Medications  . predniSONE (STERAPRED UNI-PAK 21 TAB) 10 MG (21) TBPK tablet    Sig: PO: Take 6 tablets on day 1:Take 5 tablets day 2:Take 4 tablets day 3: Take 3 tablets day 4:Take 2 tablets day five: 5 Take 1 tablet day 6    Dispense:  21 tablet    Refill:  0  . amoxicillin-clavulanate (AUGMENTIN) 875-125 MG tablet    Sig: Take 1 tablet by mouth 2 (two) times daily.    Dispense:  20 tablet    Refill:  0   Red Flags discussed. The patient was given clear instructions to go to ER or return to medical center if any red flags develop, symptoms do not improve, worsen or new problems develop. They verbalized understanding.   Return in about 1 week (around 11/01/2020), or if symptoms worsen or fail to improve, for at any time for any worsening symptoms, Go to Emergency room/ urgent care  if worse.       Marcille Buffy, [place] [phone] (phone) [phone] (fax)  [place]

## 2020-10-26 DIAGNOSIS — M79672 Pain in left foot: Secondary | ICD-10-CM | POA: Insufficient documentation

## 2020-10-26 DIAGNOSIS — R0989 Other specified symptoms and signs involving the circulatory and respiratory systems: Secondary | ICD-10-CM | POA: Insufficient documentation

## 2020-10-26 DIAGNOSIS — R601 Generalized edema: Secondary | ICD-10-CM | POA: Insufficient documentation

## 2020-10-26 LAB — CBC WITH DIFFERENTIAL/PLATELET
Basophils Absolute: 0 10*3/uL (ref 0.0–0.2)
Basos: 0 %
EOS (ABSOLUTE): 0 10*3/uL (ref 0.0–0.4)
Eos: 0 %
Hematocrit: 40 % (ref 37.5–51.0)
Hemoglobin: 13.1 g/dL (ref 13.0–17.7)
Immature Grans (Abs): 0 10*3/uL (ref 0.0–0.1)
Immature Granulocytes: 0 %
Lymphocytes Absolute: 0.8 10*3/uL (ref 0.7–3.1)
Lymphs: 11 %
MCH: 29.1 pg (ref 26.6–33.0)
MCHC: 32.8 g/dL (ref 31.5–35.7)
MCV: 89 fL (ref 79–97)
Monocytes Absolute: 0.3 10*3/uL (ref 0.1–0.9)
Monocytes: 4 %
Neutrophils Absolute: 5.7 10*3/uL (ref 1.4–7.0)
Neutrophils: 85 %
Platelets: 178 10*3/uL (ref 150–450)
RBC: 4.5 x10E6/uL (ref 4.14–5.80)
RDW: 13.3 % (ref 11.6–15.4)
WBC: 6.9 10*3/uL (ref 3.4–10.8)

## 2020-10-26 LAB — COMPREHENSIVE METABOLIC PANEL
ALT: 49 IU/L — ABNORMAL HIGH (ref 0–44)
AST: 56 IU/L — ABNORMAL HIGH (ref 0–40)
Albumin/Globulin Ratio: 1.7 (ref 1.2–2.2)
Albumin: 4.2 g/dL (ref 3.6–4.6)
Alkaline Phosphatase: 126 IU/L — ABNORMAL HIGH (ref 44–121)
BUN/Creatinine Ratio: 14 (ref 10–24)
BUN: 16 mg/dL (ref 8–27)
Bilirubin Total: 0.6 mg/dL (ref 0.0–1.2)
CO2: 29 mmol/L (ref 20–29)
Calcium: 9.3 mg/dL (ref 8.6–10.2)
Chloride: 98 mmol/L (ref 96–106)
Creatinine, Ser: 1.13 mg/dL (ref 0.76–1.27)
GFR calc Af Amer: 66 mL/min/{1.73_m2} (ref >59)
GFR calc non Af Amer: 57 mL/min/{1.73_m2} — ABNORMAL LOW (ref >59)
Globulin, Total: 2.5 g/dL (ref 1.5–4.5)
Glucose: 122 mg/dL — ABNORMAL HIGH (ref 65–99)
Potassium: 4.7 mmol/L (ref 3.5–5.2)
Sodium: 140 mmol/L (ref 134–144)
Total Protein: 6.7 g/dL (ref 6.0–8.5)

## 2020-10-26 LAB — URIC ACID: Uric Acid: 5.2 mg/dL (ref 3.8–8.4)

## 2020-10-26 LAB — BRAIN NATRIURETIC PEPTIDE: BNP: 231.2 pg/mL — ABNORMAL HIGH (ref 0.0–100.0)

## 2020-10-26 NOTE — Progress Notes (Addendum)
Glucose elevated, was not fasting. Uric acid within normal limits.  BNP slightly increased, given his symptoms, will increase Lasix to 60 mg(1.5 tablets- he has 40mg )  for 2 days, and then return to 40 mg daily. Return if any symptoms worsening, persisting or changing at anytime.  Schedule folllow up with Birdie Sons, MD in one months

## 2020-11-02 ENCOUNTER — Other Ambulatory Visit: Payer: Self-pay

## 2020-11-02 ENCOUNTER — Encounter: Payer: PRIVATE HEALTH INSURANCE | Admitting: *Deleted

## 2020-11-02 DIAGNOSIS — J449 Chronic obstructive pulmonary disease, unspecified: Secondary | ICD-10-CM

## 2020-11-02 NOTE — Progress Notes (Signed)
Daily Session Note  Patient Details  Name: MACAULAY REICHER MRN: [id number] Date of Birth: [birth year] Referring Provider:     Pulmonary Rehab from 10/18/2020 in [facility]  Referring Provider Lanney Gins      Encounter Date: 11/02/2020  Check In:  Session Check In - 11/02/20 1021      Check-In   Supervising physician immediately available to respond to emergencies See telemetry face sheet for immediately available ER MD    Location [facility]-Cardiac & Pulmonary Rehab    Staff Present Heath Lark, RN, BSN, CCRP;Amanda Sommer, BA, ACSM CEP, Exercise Physiologist;Kara Eliezer Bottom, MS Exercise Physiologist    Virtual Visit No    Medication changes reported     No    Fall or balance concerns reported    No    Warm-up and Cool-down Performed on first and last piece of equipment    Resistance Training Performed Yes    VAD Patient? No    PAD/SET Patient? No      Pain Assessment   Currently in Pain? No/denies              Social History   Tobacco Use  Smoking Status Former Smoker  . Packs/day: 1.00  . Years: 11.00  . Pack years: 11.00  . Types: Cigarettes  . Quit date: 02/12/1984  . Years since quitting: 36.7  Smokeless Tobacco Never Used  Tobacco Comment   quit smoking 30 yrs ago    Goals Met:  Proper associated with RPD/PD & O2 Sat Exercise tolerated well Personal goals reviewed No report of cardiac concerns or symptoms  Goals Unmet:  Not Applicable  Comments: First full day of exercise!  Patient was oriented to gym and equipment including functions, settings, policies, and procedures.  Patient's individual exercise prescription and treatment plan were reviewed.  All starting workloads were established based on the results of the 6 minute walk test done at initial orientation visit.  The plan for exercise progression was also introduced and progression will be customized based on patient's performance and goals.    Dr. Emily Filbert is Medical Director  for [place] and [facility].

## 2020-11-04 ENCOUNTER — Other Ambulatory Visit: Payer: Self-pay

## 2020-11-04 DIAGNOSIS — R0609 Other forms of dyspnea: Secondary | ICD-10-CM

## 2020-11-04 DIAGNOSIS — R0600 Dyspnea, unspecified: Secondary | ICD-10-CM

## 2020-11-04 DIAGNOSIS — J449 Chronic obstructive pulmonary disease, unspecified: Secondary | ICD-10-CM | POA: Diagnosis not present

## 2020-11-04 NOTE — Progress Notes (Signed)
Daily Session Note  Patient Details  Name: James [name] MRN: [id number] Date of Birth: [birth year] Referring Provider:     Pulmonary Rehab from 10/18/2020 in [facility] and Pulmonary Rehab  Referring Provider Lanney Gins      Encounter Date: 11/04/2020  Check In:  Session Check In - 11/04/20 1007      Check-In   Supervising physician immediately available to respond to emergencies See telemetry face sheet for immediately available ER MD    Location [facility]    Staff Present Birdie Sons, MPA, RN;Melissa Caiola RDN, Rowe Pavy, BA, ACSM CEP, Exercise Physiologist    Virtual Visit No    Medication changes reported     No    Fall or balance concerns reported    No    Tobacco Cessation No Change    Warm-up and Cool-down Performed on first and last piece of equipment    Resistance Training Performed Yes    VAD Patient? No    PAD/SET Patient? No      Pain Assessment   Currently in Pain? No/denies              Social History   Tobacco Use  Smoking Status Former Smoker  . Packs/day: 1.00  . Years: 11.00  . Pack years: 11.00  . Types: Cigarettes  . Quit date: 02/12/1984  . Years since quitting: 36.7  Smokeless Tobacco Never Used  Tobacco Comment   quit smoking 30 yrs ago    Goals Met:  Independence with exercise equipment Exercise tolerated well No report of cardiac concerns or symptoms Strength training completed today  Goals Unmet:  Not Applicable  Comments: Pt able to follow exercise prescription today without complaint.  Will continue to monitor for progression.    Dr. Emily Filbert is Medical Director for [place] and [facility].

## 2020-11-06 DIAGNOSIS — J449 Chronic obstructive pulmonary disease, unspecified: Secondary | ICD-10-CM | POA: Diagnosis not present

## 2020-11-16 ENCOUNTER — Other Ambulatory Visit: Payer: Self-pay

## 2020-11-16 DIAGNOSIS — J449 Chronic obstructive pulmonary disease, unspecified: Secondary | ICD-10-CM | POA: Diagnosis not present

## 2020-11-16 DIAGNOSIS — R0600 Dyspnea, unspecified: Secondary | ICD-10-CM

## 2020-11-16 DIAGNOSIS — R0609 Other forms of dyspnea: Secondary | ICD-10-CM

## 2020-11-16 NOTE — Progress Notes (Signed)
Daily Session Note  Patient Details  Name: VALENTE FOSBERG MRN: [id number] Date of Birth: May 09, 1929 Referring Provider:     Pulmonary Rehab from 10/18/2020 in [facility]monary Rehab  Referring Provider Lanney Gins      Encounter Date: 11/16/2020  Check In:  Session Check In - 11/16/20 1115      Check-In   Supervising physician immediately available to respond to emergencies See telemetry face sheet for immediately available ER MD    Staff Present Basilia Jumbo, RN, BSN;Melissa Caiola RDN, LDN;Joseph Lou Miner, MS Exercise Physiologist;Amanda Oletta Darter, [place], ACSM CEP, Exercise Physiologist    Virtual Visit No    Medication changes reported     No    Fall or balance concerns reported    No    Tobacco Cessation No Change    Warm-up and Cool-down Performed on first and last piece of equipment    Resistance Training Performed Yes    VAD Patient? No    PAD/SET Patient? No      Pain Assessment   Currently in Pain? No/denies              Social History   Tobacco Use  Smoking Status Former Smoker  . Packs/day: 1.00  . Years: 11.00  . Pack years: 11.00  . Types: Cigarettes  . Quit date: 02/12/1984  . Years since quitting: 36.7  Smokeless Tobacco Never Used  Tobacco Comment   quit smoking 30 yrs ago    Goals Met:  Independence with exercise equipment Exercise tolerated well Strength training completed today  Goals Unmet:  Not Applicable  Comments: Pt able to follow exercise prescription today without complaint.  Will continue to monitor for progression.   Dr. Emily Filbert is Medical Director for [place] and [facility].

## 2020-11-17 ENCOUNTER — Encounter: Payer: Self-pay | Admitting: *Deleted

## 2020-11-17 DIAGNOSIS — J449 Chronic obstructive pulmonary disease, unspecified: Secondary | ICD-10-CM | POA: Diagnosis not present

## 2020-11-17 NOTE — Progress Notes (Signed)
Pulmonary Individual Treatment Plan  Patient Details  Name: James [name] MRN: [id number] Date of Birth: 02/26/1929 Referring Provider:     Pulmonary Rehab from 10/18/2020 in [facility] Cardiac and Pulmonary Rehab  Referring Provider Aleskerov      Initial Encounter Date:    Pulmonary Rehab from 10/18/2020 in [facility] Cardiac and Pulmonary Rehab  Date 10/18/20      Visit Diagnosis: Chronic obstructive pulmonary disease, unspecified COPD type ([place])  Patient's Home Medications on Admission:  Current Outpatient Medications:  .  albuterol (VENTOLIN HFA) 108 (90 Base) MCG/ACT inhaler, Inhale 2 puffs into the lungs every 6 (six) hours as needed., Disp: , Rfl:  .  amoxicillin-clavulanate (AUGMENTIN) 875-125 MG tablet, Take 1 tablet by mouth 2 (two) times daily., Disp: 20 tablet, Rfl: 0 .  atorvastatin (LIPITOR) 80 MG tablet, TAKE 1 TABLET BY MOUTH DAILY AT 6PM, Disp: 90 tablet, Rfl: 3 .  azelastine (OPTIVAR) 0.05 % ophthalmic solution, APPLY 1 DROP INTO THE EYE TWICE DAILY ASDIRECTED, Disp: 6 mL, Rfl: 5 .  azithromycin (ZITHROMAX) 250 MG tablet, Take 250 mg by mouth 3 (three) times a week., Disp: , Rfl:  .  beta carotene w/minerals (OCUVITE) tablet, Take 1 tablet by mouth daily., Disp: , Rfl:  .  budesonide (PULMICORT) 0.5 MG/2ML nebulizer solution, Take 2 mLs (0.5 mg total) by nebulization 2 (two) times daily. Dx:j44.9, Disp: 75 mL, Rfl: 12 .  chlorpheniramine-HYDROcodone (TUSSIONEX) 10-8 MG/5ML SUER, TAKE 1 TEASPOONFUL EVERY 12 HOURS AS NEEDED FOR COUGH, Disp: 115 mL, Rfl: 0 .  ELIQUIS 5 MG TABS tablet, TAKE 1 TABLET BY MOUTH TWICE DAILY, Disp: 180 tablet, Rfl: 4 .  fluticasone (FLONASE) 50 MCG/ACT nasal spray, Place 2 sprays into both nostrils 2 (two) times daily., Disp: 48 g, Rfl: 3 .  furosemide (LASIX) 40 MG tablet, Take 40 mg by mouth daily., Disp: , Rfl:  .  ipratropium-albuterol (DUONEB) 0.5-2.5 (3) MG/3ML SOLN, Take 3 mLs by nebulization 4 (four) times daily. , Disp: , Rfl:  .  lansoprazole  (PREVACID) 30 MG capsule, TAKE 1 CAPSULE BY MOUTH EVERY DAY, Disp: 90 capsule, Rfl: 3 .  LORazepam (ATIVAN) 1 MG tablet, TAKE 1 TABLET BY MOUTH AT BEDTIME, Disp: 30 tablet, Rfl: 5 .  Magnesium 400 MG CAPS, Take 1 capsule by mouth daily. , Disp: , Rfl:  .  metolazone (ZAROXOLYN) 5 MG tablet, Take 5 mg by mouth daily., Disp: , Rfl:  .  metoprolol tartrate (LOPRESSOR) 50 MG tablet, TAKE ONE TABLET TWICE DAILY, Disp: 60 tablet, Rfl: 0 .  montelukast (SINGULAIR) 10 MG tablet, TAKE 1 TABLET BY MOUTH AT BEDTIME, Disp: 90 tablet, Rfl: 0 .  MULTIPLE VITAMIN PO, Take 1 tablet by mouth daily. , Disp: , Rfl:  .  Omega-3 Fatty Acids (FISH OIL) 1000 MG CAPS, Take 1 capsule daily by mouth., Disp: , Rfl:  .  potassium chloride (KLOR-CON) 10 MEQ tablet, Take 10 mEq by mouth daily., Disp: , Rfl:  .  predniSONE (STERAPRED UNI-PAK 21 TAB) 10 MG (21) TBPK tablet, PO: Take 6 tablets on day 1:Take 5 tablets day 2:Take 4 tablets day 3: Take 3 tablets day 4:Take 2 tablets day five: 5 Take 1 tablet day 6, Disp: 21 tablet, Rfl: 0 .  tamsulosin (FLOMAX) 0.4 MG CAPS capsule, Take 0.4 mg by mouth daily., Disp: , Rfl:   Past Medical History: Past Medical History:  Diagnosis Date  . Arthritis   . Back pain   . CAD (coronary artery disease)   .  CAP (community acquired pneumonia) 11/06/2017  . Cataract    right  . COPD (chronic obstructive pulmonary disease) ([place])    SPiriva and SYmbicort daily. Albuterol as needed  . Diverticulosis   . Dyspnea    with exertion  . Enlarged prostate    takes Flomax daily  . GERD (gastroesophageal reflux disease)   . History of chicken pox   . History of gout   . History of measles   . History of mumps   . HOH (hard of hearing)   . Hyperglycemia   . Hyperlipidemia    takes Atorvastatin daily  . Hypertension    takes Metoprolol daily as well as Lotensin HCT  . Hypotension   . Joint pain   . Microscopic colitis   . PAF (paroxysmal atrial fibrillation) ([place]), RVR 03/14/2017  .  Pneumonia   . Prostate cancer ([place])   . Stroke [facility])    TIA  . TIA (transient ischemic attack)   . Weakness    numbness and tingling.mainly on right    Tobacco Use: Social History   Tobacco Use  Smoking Status Former Smoker  . Packs/day: 1.00  . Years: 11.00  . Pack years: 11.00  . Types: Cigarettes  . Quit date: 02/12/1984  . Years since quitting: 36.7  Smokeless Tobacco Never Used  Tobacco Comment   quit smoking 30 yrs ago    Labs: Recent Review Flowsheet Data    Labs for ITP Cardiac and Pulmonary Rehab Latest Ref Rng & Units 10/05/2014 10/07/2014 10/31/2017 12/03/2018 07/13/2019   Cholestrol 0 - 200 mg/dL - 104 106 99 -   LDLCALC 0 - 99 mg/dL - 40 45 34 -   HDL >40 mg/dL - 52 52 55 -   Trlycerides <150 mg/dL - 61 47 51 -   Hemoglobin A1c 4.8 - 5.6 % 5.7 5.7 6.1(H) - 6.1(H)   HCO3 20.0 - 28.0 mmol/L - - - - 30.1(H)   O2SAT % - - - - 75.8       Pulmonary Assessment Scores:  Pulmonary Assessment Scores    Row Name 10/18/20 1600         ADL UCSD   SOB Score total 59     Rest 1     Walk 3     Stairs 4     Bath 2     Dress 3     Shop 2       CAT Score   CAT Score 19       mMRC Score   mMRC Score 1            UCSD: Self-administered rating of dyspnea associated with activities of daily living (ADLs) 6-point scale (0 = "not at all" to 5 = "maximal or unable to do because of breathlessness")  Scoring Scores range from 0 to 120.  Minimally important difference is 5 units  CAT: CAT can identify the health impairment of COPD patients and is better correlated with disease progression.  CAT has a scoring range of zero to 40. The CAT score is classified into four groups of low (less than 10), medium (10 - 20), high (21-30) and very high (31-40) based on the impact level of disease on health status. A CAT score over 10 suggests significant symptoms.  A worsening CAT score could be explained by an exacerbation, poor medication adherence, poor inhaler technique,  or progression of COPD or comorbid conditions.  CAT MCID is 2 points  mMRC: mMRC (  [place]) Dyspnea Scale is used to assess the degree of baseline functional disability in patients of respiratory disease due to dyspnea. No minimal important difference is established. A decrease in score of 1 point or greater is considered a positive change.   Pulmonary Function Assessment:   Exercise Target Goals: Exercise Program Goal: Individual exercise prescription set using results from initial 6 min walk test and THRR while considering  patient's activity barriers and safety.   Exercise Prescription Goal: Initial exercise prescription builds to 30-45 minutes a day of aerobic activity, 2-3 days per week.  Home exercise guidelines will be given to patient during program as part of exercise prescription that the participant will acknowledge.  Education: Aerobic Exercise: - Group verbal and visual presentation on the components of exercise prescription. Introduces F.I.T.T principle from ACSM for exercise prescriptions.  Reviews F.I.T.T. principles of aerobic exercise including progression. Written material given at graduation.   Pulmonary Rehab from 02/21/2017 in [facility] Cardiac and Pulmonary Rehab  Date 01/17/17  Educator [facility]  Instruction Review Code (retired) 2- meets goals/outcomes      Education: Resistance Exercise: - Group verbal and visual presentation on the components of exercise prescription. Introduces F.I.T.T principle from ACSM for exercise prescriptions  Reviews F.I.T.T. principles of resistance exercise including progression. Written material given at graduation.    Education: Exercise & Equipment Safety: - Individual verbal instruction and demonstration of equipment use and safety with use of the equipment.   Pulmonary Rehab from 11/04/2020 in [facility] Cardiac and Pulmonary Rehab  Date 10/18/20  Educator AS  Instruction Review Code 1- Verbalizes Understanding       Education: Exercise Physiology & General Exercise Guidelines: - Group verbal and written instruction with models to review the exercise physiology of the cardiovascular system and associated critical values. Provides general exercise guidelines with specific guidelines to those with heart or lung disease.    Pulmonary Rehab from 02/21/2017 in [facility] Cardiac and Pulmonary Rehab  Date 12/20/16  Educator [facility]  Instruction Review Code (retired) 2- meets goals/outcomes      Education: Flexibility, Balance, Mind/Body Relaxation: - Group verbal and visual presentation with interactive activity on the components of exercise prescription. Introduces F.I.T.T principle from ACSM for exercise prescriptions. Reviews F.I.T.T. principles of flexibility and balance exercise training including progression. Also discusses the mind body connection.  Reviews various relaxation techniques to help reduce and manage stress (i.e. Deep breathing, progressive muscle relaxation, and visualization). Balance handout provided to take home. Written material given at graduation.   Pulmonary Rehab from 11/01/2018 in [facility] Cardiac and Pulmonary Rehab  Date 10/09/18  Educator AS  Instruction Review Code 1- Verbalizes Understanding      Activity Barriers & Risk Stratification:  Activity Barriers & Cardiac Risk Stratification - 10/15/20 1305      Activity Barriers & Cardiac Risk Stratification   Activity Barriers Back Problems;Arthritis;Shortness of Breath           6 Minute Walk:  6 Minute Walk    Row Name 10/18/20 1546         6 Minute Walk   Phase Initial     Distance 495 feet     Walk Time 5.3 minutes     # of Rest Breaks 1     MPH 1.06     METS 1.77     RPE 19     Perceived Dyspnea  3     VO2 Peak 0.6     Symptoms Yes (comment)  Comments shortness of breath     Resting HR 98 bpm     Resting BP 108/52     Resting Oxygen Saturation  95 %     Exercise Oxygen Saturation  during 6 min walk 89 %      Max Ex. HR 110 bpm     Max Ex. BP 116/54     2 Minute Post BP 104/56       Interval HR   1 Minute HR 98     2 Minute HR 104     3 Minute HR 108     4 Minute HR 108     5 Minute HR 109     6 Minute HR 110     2 Minute Post HR 99     Interval Heart Rate? Yes       Interval Oxygen   Interval Oxygen? Yes     Baseline Oxygen Saturation % 95 %     1 Minute Oxygen Saturation % 93 %     1 Minute Liters of Oxygen 0 L     2 Minute Oxygen Saturation % 90 %     2 Minute Liters of Oxygen 0 L     3 Minute Oxygen Saturation % 90 %     3 Minute Liters of Oxygen 0 L     4 Minute Oxygen Saturation % 93 %     4 Minute Liters of Oxygen 0 L     5 Minute Oxygen Saturation % 93 %     5 Minute Liters of Oxygen 0 L     6 Minute Oxygen Saturation % 91 %     6 Minute Liters of Oxygen 0 L     2 Minute Post Oxygen Saturation % 95 %     2 Minute Post Liters of Oxygen 0 L           Oxygen Initial Assessment:  Oxygen Initial Assessment - 11/02/20 1023      Home Oxygen   Home Oxygen Device Home Concentrator    Sleep Oxygen Prescription Continuous    Liters per minute 2    Home Exercise Oxygen Prescription None    Home Resting Oxygen Prescription None    Compliance with Home Oxygen Use Yes      Intervention   Short Term Goals To learn and demonstrate proper pursed lip breathing techniques or other breathing techniques.    Long  Term Goals --           Oxygen Re-Evaluation:  Oxygen Re-Evaluation    [place] Name 11/02/20 1025             Program Oxygen Prescription   Program Oxygen Prescription None         Home Oxygen   Sleep Oxygen Prescription Continuous       Liters per minute 2       Home Exercise Oxygen Prescription None       Home Resting Oxygen Prescription None       Compliance with Home Oxygen Use Yes         Goals/Expected Outcomes   Short Term Goals To learn and demonstrate proper pursed lip breathing techniques or other breathing techniques.       Long  Term Goals  Exhibits proper breathing techniques, such as pursed lip breathing or other method taught during program session       Comments Reviewed PLB technique with pt.  Talked about how it  works and it's importance in maintaining their exercise saturations.       Goals/Expected Outcomes Short: Become more profiecient at using PLB.   Long: Become independent at using PLB.              Oxygen Discharge (Final Oxygen Re-Evaluation):  Oxygen Re-Evaluation - 11/02/20 1025      Program Oxygen Prescription   Program Oxygen Prescription None      Home Oxygen   Sleep Oxygen Prescription Continuous    Liters per minute 2    Home Exercise Oxygen Prescription None    Home Resting Oxygen Prescription None    Compliance with Home Oxygen Use Yes      Goals/Expected Outcomes   Short Term Goals To learn and demonstrate proper pursed lip breathing techniques or other breathing techniques.    Long  Term Goals Exhibits proper breathing techniques, such as pursed lip breathing or other method taught during program session    Comments Reviewed PLB technique with pt.  Talked about how it works and it's importance in maintaining their exercise saturations.    Goals/Expected Outcomes Short: Become more profiecient at using PLB.   Long: Become independent at using PLB.           Initial Exercise Prescription:  Initial Exercise Prescription - 10/18/20 1500      Date of Initial Exercise RX and Referring Provider   Date 10/18/20    Referring Provider Aleskerov      Treadmill   MPH 1    Grade 0    Minutes 15    METs 1.77      Recumbant Bike   Level 1    RPM 60    Minutes 15    METs 1.5      NuStep   Level 1    SPM 80    Minutes 15    METs 1.5      Arm Ergometer   Level 1    RPM 30    Minutes 15    METs 1.5      T5 Nustep   Level 1    SPM 80    Minutes 15    METs 1.5      Prescription Details   Frequency (times per week) 3    Duration Progress to 30 minutes of continuous aerobic  without signs/symptoms of physical distress      Intensity   THRR 40-80% of Max Heartrate 111-124    Ratings of Perceived Exertion 11-13    Perceived Dyspnea 0-4      Progression   Progression Continue to progress workloads to maintain intensity without signs/symptoms of physical distress.      Resistance Training   Training Prescription Yes    Weight 3 lb    Reps 10-15           Perform Capillary Blood Glucose checks as needed.  Exercise Prescription Changes:  Exercise Prescription Changes    Row Name 10/18/20 1500             Response to Exercise   Blood Pressure (Admit) 108/52       Blood Pressure (Exercise) 116/54       Blood Pressure (Exit) 104/56       Heart Rate (Admit) 98 bpm       Heart Rate (Exercise) 110 bpm       Heart Rate (Exit) 99 bpm       Oxygen Saturation (Admit) 95 %  Oxygen Saturation (Exercise) 89 %       Oxygen Saturation (Exit) 95 %       Rating of Perceived Exertion (Exercise) 19       Perceived Dyspnea (Exercise) 3       Symptoms SOB              Exercise Comments:  Exercise Comments    Row Name 11/02/20 1022           Exercise Comments First full day of exercise!  Patient was oriented to gym and equipment including functions, settings, policies, and procedures.  Patient's individual exercise prescription and treatment plan were reviewed.  All starting workloads were established based on the results of the 6 minute walk test done at initial orientation visit.  The plan for exercise progression was also introduced and progression will be customized based on patient's performance and goals.              Exercise Goals and Review:  Exercise Goals    Row Name 10/18/20 1558             Exercise Goals   Increase Physical Activity Yes       Intervention Provide advice, education, support and counseling about physical activity/exercise needs.;Develop an individualized exercise prescription for aerobic and resistive training  based on initial evaluation findings, risk stratification, comorbidities and participant's personal goals.       Expected Outcomes Short Term: Attend rehab on a regular basis to increase amount of physical activity.;Long Term: Add in home exercise to make exercise part of routine and to increase amount of physical activity.;Long Term: Exercising regularly at least 3-5 days a week.       Increase Strength and Stamina Yes       Intervention Provide advice, education, support and counseling about physical activity/exercise needs.;Develop an individualized exercise prescription for aerobic and resistive training based on initial evaluation findings, risk stratification, comorbidities and participant's personal goals.       Expected Outcomes Short Term: Increase workloads from initial exercise prescription for resistance, speed, and METs.;Short Term: Perform resistance training exercises routinely during rehab and add in resistance training at home;Long Term: Improve cardiorespiratory fitness, muscular endurance and strength as measured by increased METs and functional capacity (6MWT)       Able to understand and use rate of perceived exertion (RPE) scale Yes       Intervention Provide education and explanation on how to use RPE scale       Expected Outcomes Short Term: Able to use RPE daily in rehab to express subjective intensity level;Long Term:  Able to use RPE to guide intensity level when exercising independently       Able to understand and use Dyspnea scale Yes       Intervention Provide education and explanation on how to use Dyspnea scale       Expected Outcomes Short Term: Able to use Dyspnea scale daily in rehab to express subjective sense of shortness of breath during exertion;Long Term: Able to use Dyspnea scale to guide intensity level when exercising independently       Knowledge and understanding of Target Heart Rate Range (THRR) Yes       Intervention Provide education and explanation of  THRR including how the numbers were predicted and where they are located for reference       Expected Outcomes Short Term: Able to state/look up THRR;Short Term: Able to use daily as guideline for intensity in  rehab;Long Term: Able to use THRR to govern intensity when exercising independently       Able to check pulse independently Yes       Intervention Review the importance of being able to check your own pulse for safety during independent exercise       Expected Outcomes Short Term: Able to explain why pulse checking is important during independent exercise;Long Term: Able to check pulse independently and accurately       Understanding of Exercise Prescription Yes       Intervention Provide education, explanation, and written materials on patient's individual exercise prescription       Expected Outcomes Short Term: Able to explain program exercise prescription;Long Term: Able to explain home exercise prescription to exercise independently              Exercise Goals Re-Evaluation :  Exercise Goals Re-Evaluation    Row Name 11/02/20 1022             Exercise Goal Re-Evaluation   Exercise Goals Review Able to understand and use rate of perceived exertion (RPE) scale;Able to understand and use Dyspnea scale;Knowledge and understanding of Target Heart Rate Range (THRR);Understanding of Exercise Prescription       Comments Reviewed RPE and dyspnea scales, THR and program prescription with pt today.  Pt voiced understanding and was given a copy of goals to take home.       Expected Outcomes Short: Use RPE daily to regulate intensity. Long: Follow program prescription in THR.              Discharge Exercise Prescription (Final Exercise Prescription Changes):  Exercise Prescription Changes - 10/18/20 1500      Response to Exercise   Blood Pressure (Admit) 108/52    Blood Pressure (Exercise) 116/54    Blood Pressure (Exit) 104/56    Heart Rate (Admit) 98 bpm    Heart Rate (Exercise)  110 bpm    Heart Rate (Exit) 99 bpm    Oxygen Saturation (Admit) 95 %    Oxygen Saturation (Exercise) 89 %    Oxygen Saturation (Exit) 95 %    Rating of Perceived Exertion (Exercise) 19    Perceived Dyspnea (Exercise) 3    Symptoms SOB           Nutrition:  Target Goals: Understanding of nutrition guidelines, daily intake of sodium 1500mg , cholesterol 200mg , calories 30% from fat and 7% or less from saturated fats, daily to have 5 or more servings of fruits and vegetables.  Education: All About Nutrition: -Group instruction provided by verbal, written material, interactive activities, discussions, models, and posters to present general guidelines for heart healthy nutrition including fat, fiber, MyPlate, the role of sodium in heart healthy nutrition, utilization of the nutrition label, and utilization of this knowledge for meal planning. Follow up email sent as well. Written material given at graduation.   Pulmonary Rehab from 11/01/2018 in [facility] Cardiac and Pulmonary Rehab  Date 10/23/18  Educator [facility]  Instruction Review Code 1- Verbalizes Understanding      Biometrics:  Pre Biometrics - 10/18/20 1559      Pre Biometrics   Height 5' 3.5" (1.613 m)    Weight 177 lb 8 oz (80.5 kg)    BMI (Calculated) 30.95    Single Leg Stand 2.31 seconds            Nutrition Therapy Plan and Nutrition Goals:   Nutrition Assessments:  Nutrition Assessments - 10/18/20 1606  MEDFICTS Scores   Pre Score 16          MEDIFICTS Score Key:  ?70 Need to make dietary changes   40-70 Heart Healthy Diet  ? 40 Therapeutic Level Cholesterol Diet   Picture Your Plate Scores:  <01 Unhealthy dietary pattern with much room for improvement.  41-50 Dietary pattern unlikely to meet recommendations for good health and room for improvement.  51-60 More healthful dietary pattern, with some room for improvement.   >60 Healthy dietary pattern, although there may be some specific  behaviors that could be improved.   Nutrition Goals Re-Evaluation:   Nutrition Goals Discharge (Final Nutrition Goals Re-Evaluation):   Psychosocial: Target Goals: Acknowledge presence or absence of significant depression and/or stress, maximize coping skills, provide positive support system. Participant is able to verbalize types and ability to use techniques and skills needed for reducing stress and depression.   Education: Stress, Anxiety, and Depression - Group verbal and visual presentation to define topics covered.  Reviews how body is impacted by stress, anxiety, and depression.  Also discusses healthy ways to reduce stress and to treat/manage anxiety and depression.  Written material given at graduation.   Pulmonary Rehab from 11/01/2018 in [facility] Cardiac and Pulmonary Rehab  Date 10/02/18  Educator [facility]  Instruction Review Code 1- [employer] Understanding      Education: Sleep Hygiene -Provides group verbal and written instruction about how sleep can affect your health.  Define sleep hygiene, discuss sleep cycles and impact of sleep habits. Review good sleep hygiene tips.    Initial Review & Psychosocial Screening:  Initial Psych Review & Screening - 10/15/20 1307      Initial Review   Current issues with Current Sleep Concerns      Family Dynamics   Good Support System? Yes   wife     Barriers   Psychosocial barriers to participate in program There are no identifiable barriers or psychosocial needs.;The patient should benefit from training in stress management and relaxation.      Screening Interventions   Interventions Encouraged to exercise;To provide support and resources with identified psychosocial needs;Provide feedback about the scores to participant    Expected Outcomes Short Term goal: Utilizing psychosocial counselor, staff and physician to assist with identification of specific Stressors or current issues interfering with healing process. Setting desired goal  for each stressor or current issue identified.;Long Term Goal: Stressors or current issues are controlled or eliminated.;Short Term goal: Identification and review with participant of any Quality of Life or Depression concerns found by scoring the questionnaire.;Long Term goal: The participant improves quality of Life and PHQ9 Scores as seen by post scores and/or verbalization of changes           Quality of Life Scores:  Scores of 19 and below usually indicate a poorer quality of life in these areas.  A difference of  2-3 points is a clinically meaningful difference.  A difference of 2-3 points in the total score of the Quality of Life Index has been associated with significant improvement in overall quality of life, self-image, physical symptoms, and general health in studies assessing change in quality of life.  PHQ-9: Recent Review Flowsheet Data    Depression screen [facility] 2/9 10/18/2020 07/12/2020 06/14/2020 09/24/2018 05/08/2018   Decreased Interest 2  0 0 0 0   Down, Depressed, Hopeless 0 0 0 0 0   PHQ - 2 Score 2 0 0 0 0   Altered sleeping 2  - - 0 -  Tired, decreased energy 2  - - 1 -   Change in appetite 1 - - 2 -   Feeling bad or failure about yourself  0 - - 0 -   Trouble concentrating 1 - - 0 -   Moving slowly or fidgety/restless 0 - - 0 -   Suicidal thoughts 0 - - 0 -   PHQ-9 Score 8 - - 3 -   Difficult doing work/chores Somewhat difficult - - Not difficult at all -     Interpretation of Total Score  Total Score Depression Severity:  1-4 = Minimal depression, 5-9 = Mild depression, 10-14 = Moderate depression, 15-19 = Moderately severe depression, 20-27 = Severe depression   Psychosocial Evaluation and Intervention:  Psychosocial Evaluation - 10/15/20 1319      Psychosocial Evaluation & Interventions   Interventions Encouraged to exercise with the program and follow exercise prescription    Comments Lavalle is a returning patient who hopes to improve his breathing with  exercise while here again. His wife is a huge support. This last year has been difficult with Covid restrictions and he is still receiving treatment for his lung cancer. He states he is overall doing as well as can be besides his breathing affecting his sleep some nights.    Expected Outcomes Short: attend pulmonary rehab for educaiton and exercise. Long: maintain positive self care habits.    Continue Psychosocial Services  Follow up required by staff           Psychosocial Re-Evaluation:   Psychosocial Discharge (Final Psychosocial Re-Evaluation):   Education: Education Goals: Education classes will be provided on a weekly basis, covering required topics. Participant will state understanding/return demonstration of topics presented.  Learning Barriers/Preferences:  Learning Barriers/Preferences - 10/15/20 1307      Learning Barriers/Preferences   Learning Barriers Hearing    Learning Preferences None           General Pulmonary Education Topics:  Infection Prevention: - Provides verbal and written material to individual with discussion of infection control including proper hand washing and proper equipment cleaning during exercise session.   Pulmonary Rehab from 11/04/2020 in [facility] Cardiac and Pulmonary Rehab  Date 10/18/20  Educator AS  Instruction Review Code 1- Verbalizes Understanding      Falls Prevention: - Provides verbal and written material to individual with discussion of falls prevention and safety.   Pulmonary Rehab from 11/04/2020 in [facility] Cardiac and Pulmonary Rehab  Date 10/18/20  Educator AS  Instruction Review Code 1- Verbalizes Understanding      Chronic Lung Disease Review: - Group verbal instruction with posters, models, PowerPoint presentations and videos,  to review new updates, new respiratory medications, new advancements in procedures and treatments. Providing information on websites and "800" numbers for continued self-education. Includes  information about supplement oxygen, available portable oxygen systems, continuous and intermittent flow rates, oxygen safety, concentrators, and Medicare reimbursement for oxygen. Explanation of Pulmonary Drugs, including class, frequency, complications, importance of spacers, rinsing mouth after steroid MDI's, and proper cleaning methods for nebulizers. Review of basic lung anatomy and physiology related to function, structure, and complications of lung disease. Review of risk factors. Discussion about methods for diagnosing sleep apnea and types of masks and machines for OSA. Includes a review of the use of types of environmental controls: home humidity, furnaces, filters, dust mite/pet prevention, HEPA vacuums. Discussion about weather changes, air quality and the benefits of nasal washing. Instruction on Warning signs, infection symptoms, calling MD promptly, preventive  modes, and value of vaccinations. Review of effective airway clearance, coughing and/or vibration techniques. Emphasizing that all should Create an Action Plan. Written material given at graduation.   Pulmonary Rehab from 02/21/2017 in [facility] Cardiac and Pulmonary Rehab  Date 01/10/17  Educator LB  Instruction Review Code (retired) 2- meets goals/outcomes      AED/CPR: - Group verbal and written instruction with the use of models to demonstrate the basic use of the AED with the basic ABC's of resuscitation.    Anatomy and Cardiac Procedures: - Group verbal and visual presentation and models provide information about basic cardiac anatomy and function. Reviews the testing methods done to diagnose heart disease and the outcomes of the test results. Describes the treatment choices: Medical Management, Angioplasty, or Coronary Bypass Surgery for treating various heart conditions including Myocardial Infarction, Angina, Valve Disease, and Cardiac Arrhythmias.  Written material given at graduation.   Pulmonary Rehab from 02/21/2017 in [facility]  Cardiac and Pulmonary Rehab  Date 02/02/17  Educator CE  Instruction Review Code (retired) 2- meets goals/outcomes      Medication Safety: - Group verbal and visual instruction to review commonly prescribed medications for heart and lung disease. Reviews the medication, class of the drug, and side effects. Includes the steps to properly store meds and maintain the prescription regimen.  Written material given at graduation.   Pulmonary Rehab from 11/04/2020 in [facility] Cardiac and Pulmonary Rehab  Date 11/04/20  Educator [facility]  Instruction Review Code 1- Verbalizes Understanding      Other: -Provides group and verbal instruction on various topics (see comments)   Knowledge Questionnaire Score:  Knowledge Questionnaire Score - 10/18/20 1605      Knowledge Questionnaire Score   Pre Score 15/18 oxygen            Core Components/Risk Factors/Patient Goals at Admission:  Personal Goals and Risk Factors at Admission - 10/18/20 1606      Core Components/Risk Factors/Patient Goals on Admission    Weight Management Yes;Weight Loss    Intervention Weight Management: Develop a combined nutrition and exercise program designed to reach desired caloric intake, while maintaining appropriate intake of nutrient and fiber, sodium and fats, and appropriate energy expenditure required for the weight goal.;Weight Management: Provide education and appropriate resources to help participant work on and attain dietary goals.;Weight Management/Obesity: Establish reasonable short term and long term weight goals.    Admit Weight 177 lb 8 oz (80.5 kg)    Goal Weight: Short Term 175 lb (79.4 kg)    Goal Weight: Long Term 170 lb (77.1 kg)    Expected Outcomes Short Term: Continue to assess and modify interventions until short term weight is achieved;Long Term: Adherence to nutrition and physical activity/exercise program aimed toward attainment of established weight goal;Weight Loss: Understanding of general  recommendations for a balanced deficit meal plan, which promotes 1-2 lb weight loss per week and includes a negative energy balance of [phone] kcal/d;Understanding recommendations for meals to include 15-35% energy as protein, 25-35% energy from fat, 35-60% energy from carbohydrates, less than 200mg  of dietary cholesterol, 20-35 gm of total fiber daily;Understanding of distribution of calorie intake throughout the day with the consumption of 4-5 meals/snacks    Improve shortness of breath with ADL's Yes    Intervention Provide education, individualized exercise plan and daily activity instruction to help decrease symptoms of SOB with activities of daily living.    Expected Outcomes Short Term: Improve cardiorespiratory fitness to achieve a reduction of symptoms when performing  ADLs;Long Term: Be able to perform more ADLs without symptoms or delay the onset of symptoms    Heart Failure Yes    Intervention Provide a combined exercise and nutrition program that is supplemented with education, support and counseling about heart failure. Directed toward relieving symptoms such as shortness of breath, decreased exercise tolerance, and extremity edema.    Expected Outcomes Improve functional capacity of life;Short term: Attendance in program 2-3 days a week with increased exercise capacity. Reported lower sodium intake. Reported increased fruit and vegetable intake. Reports medication compliance.;Short term: Daily weights obtained and reported for increase. Utilizing diuretic protocols set by physician.;Long term: Adoption of self-care skills and reduction of barriers for early signs and symptoms recognition and intervention leading to self-care maintenance.    Hypertension Yes    Intervention Provide education on lifestyle modifcations including regular physical activity/exercise, weight management, moderate sodium restriction and increased consumption of fresh fruit, vegetables, and low fat dairy, alcohol  moderation, and smoking cessation.;Monitor prescription use compliance.    Lipids Yes    Intervention Provide education and support for participant on nutrition & aerobic/resistive exercise along with prescribed medications to achieve LDL 70mg , HDL >40mg .    Expected Outcomes Short Term: Participant states understanding of desired cholesterol values and is compliant with medications prescribed. Participant is following exercise prescription and nutrition guidelines.;Long Term: Cholesterol controlled with medications as prescribed, with individualized exercise RX and with personalized nutrition plan. Value goals: LDL < 70mg , HDL > 40 mg.           Education:Diabetes - Individual verbal and written instruction to review signs/symptoms of diabetes, desired ranges of glucose level fasting, after meals and with exercise. Acknowledge that pre and post exercise glucose checks will be done for 3 sessions at entry of program.   Know Your Numbers and Heart Failure: - Group verbal and visual instruction to discuss disease risk factors for cardiac and pulmonary disease and treatment options.  Reviews associated critical values for Overweight/Obesity, Hypertension, Cholesterol, and Diabetes.  Discusses basics of heart failure: signs/symptoms and treatments.  Introduces Heart Failure Zone chart for action plan for heart failure.  Written material given at graduation.   Core Components/Risk Factors/Patient Goals Review:    Core Components/Risk Factors/Patient Goals at Discharge (Final Review):    ITP Comments:  ITP Comments    Row Name 10/15/20 1303 10/18/20 1611 10/20/20 0704 10/25/20 1128 11/02/20 1022   ITP Comments Initial telephone encounter completed. Diagnosis can be found in [facility] 10/7. EP orientation scheduled for 11/1 2:30pm Completed 6MWT and gym orientation. Initial ITP created and sent for review to Dr. Emily Filbert, Medical Director. 30 Day review completed. Medical Director ITP review done,  changes made as directed, and signed approval by Medical Director. Lenorris called to let us know that he will be out this week with a gout flare up. First full day of exercise!  Patient was oriented to gym and equipment including functions, settings, policies, and procedures.  Patient's individual exercise prescription and treatment plan were reviewed.  All starting workloads were established based on the results of the 6 minute walk test done at initial orientation visit.  The plan for exercise progression was also introduced and progression will be customized based on patient's performance and goals.   [place] Name 11/17/20 0905           ITP Comments 30 Day review completed. Medical Director ITP review done, changes made as directed, and signed approval by Medical Director.  Comments:

## 2020-11-18 ENCOUNTER — Other Ambulatory Visit: Payer: Self-pay

## 2020-11-18 ENCOUNTER — Encounter: Payer: Medicare Other | Attending: Pulmonary Disease

## 2020-11-18 DIAGNOSIS — J449 Chronic obstructive pulmonary disease, unspecified: Secondary | ICD-10-CM | POA: Diagnosis not present

## 2020-11-18 DIAGNOSIS — R0609 Other forms of dyspnea: Secondary | ICD-10-CM

## 2020-11-18 DIAGNOSIS — R0600 Dyspnea, unspecified: Secondary | ICD-10-CM | POA: Insufficient documentation

## 2020-11-18 NOTE — Progress Notes (Signed)
Daily Session Note  Patient Details  Name: CODY ALBUS MRN: [id number] Date of Birth: 1929-01-01 Referring Provider:     Pulmonary Rehab from 10/18/2020 in [facility] Cardiac and Pulmonary Rehab  Referring Provider Lanney Gins      Encounter Date: 11/18/2020  Check In:  Session Check In - 11/18/20 1056      Check-In   Supervising physician immediately available to respond to emergencies See telemetry face sheet for immediately available ER MD    Location [facility]-Cardiac & Pulmonary Rehab    Staff Present Birdie Sons, MPA, RN;Melissa Caiola RDN, Rowe Pavy, BA, ACSM CEP, Exercise Physiologist;Meredith Sherryll Burger, RN BSN    Virtual Visit No    Medication changes reported     No    Fall or balance concerns reported    No    Tobacco Cessation No Change    Warm-up and Cool-down Performed on first and last piece of equipment    Resistance Training Performed Yes    VAD Patient? No    PAD/SET Patient? No      Pain Assessment   Currently in Pain? No/denies              Social History   Tobacco Use  Smoking Status Former Smoker  . Packs/day: 1.00  . Years: 11.00  . Pack years: 11.00  . Types: Cigarettes  . Quit date: 02/12/1984  . Years since quitting: 36.7  Smokeless Tobacco Never Used  Tobacco Comment   quit smoking 30 yrs ago    Goals Met:  Independence with exercise equipment Exercise tolerated well No report of cardiac concerns or symptoms Strength training completed today  Goals Unmet:  Not Applicable  Comments: Pt able to follow exercise prescription today without complaint.  Will continue to monitor for progression.    Dr. Emily Filbert is Medical Director for [place] and [facility].

## 2020-11-25 ENCOUNTER — Other Ambulatory Visit: Payer: Self-pay

## 2020-11-25 DIAGNOSIS — R0600 Dyspnea, unspecified: Secondary | ICD-10-CM | POA: Diagnosis not present

## 2020-11-25 DIAGNOSIS — J449 Chronic obstructive pulmonary disease, unspecified: Secondary | ICD-10-CM | POA: Diagnosis not present

## 2020-11-25 DIAGNOSIS — R0609 Other forms of dyspnea: Secondary | ICD-10-CM

## 2020-11-25 NOTE — Progress Notes (Signed)
Daily Session Note  Patient Details  Name: James [name] MRN: [id number] Date of Birth: 1929/03/20 Referring Provider:   Flowsheet Row Pulmonary Rehab from 10/18/2020 in [facility] Cardiac and Pulmonary Rehab  Referring Provider Lanney Gins      Encounter Date: 11/25/2020  Check In:  Session Check In - 11/25/20 0955      Check-In   Supervising physician immediately available to respond to emergencies See telemetry face sheet for immediately available ER MD    Location [facility]-Cardiac & Pulmonary Rehab    Staff Present Birdie Sons, MPA, RN;Melissa Caiola RDN, Rowe Pavy, BA, ACSM CEP, Exercise Physiologist;Jessica [place], MA, RCEP, CCRP, CCET    Virtual Visit No    Medication changes reported     No    Fall or balance concerns reported    No    Tobacco Cessation No Change    Warm-up and Cool-down Performed on first and last piece of equipment    Resistance Training Performed Yes    VAD Patient? No    PAD/SET Patient? No      Pain Assessment   Currently in Pain? No/denies              Social History   Tobacco Use  Smoking Status Former Smoker  . Packs/day: 1.00  . Years: 11.00  . Pack years: 11.00  . Types: Cigarettes  . Quit date: 02/12/1984  . Years since quitting: 36.8  Smokeless Tobacco Never Used  Tobacco Comment   quit smoking 30 yrs ago    Goals Met:  Independence with exercise equipment Exercise tolerated well No report of cardiac concerns or symptoms Strength training completed today  Goals Unmet:  Not Applicable  Comments: Pt able to follow exercise prescription today without complaint.  Will continue to monitor for progression.    Dr. Emily Filbert is Medical Director for [place] and [facility].

## 2020-11-27 ENCOUNTER — Other Ambulatory Visit: Payer: Self-pay | Admitting: Family Medicine

## 2020-11-27 DIAGNOSIS — R053 Chronic cough: Secondary | ICD-10-CM

## 2020-11-27 NOTE — Telephone Encounter (Signed)
Requested medication (s) are due for refill today: yes  Requested medication (s) are on the active medication list: yes  Last refill:  08/09/20  Future visit scheduled: yes  Notes to clinic:  med not assigned to a protocol   Requested Prescriptions  Pending Prescriptions Disp Refills   chlorpheniramine-HYDROcodone ([place]) 10-8 MG/5ML [facility] [Pharmacy Med Name: HYDROCOD POLST-CPM POLST ER 10-8 MG] 115 mL     Sig: TAKE ONE TEASPOONFUL (5ML) EVERY TWELVE HOURS IF NEEDED FOR COUGH      Off-Protocol Failed - 11/27/2020 11:43 AM      Failed - Medication not assigned to a protocol, review manually.      Passed - Valid encounter within last 12 months    Recent Outpatient Visits           1 month ago COPD exacerbation ([place])   [place], Kelby Aline, FNP   3 months ago Dyspnea on exertion   [facility] Birdie Sons, MD   5 months ago Chronic obstructive pulmonary disease, unspecified COPD type [facility])   [facility] Birdie Sons, MD   8 months ago COPD with acute exacerbation [facility])   [facility] [place], Wendee Beavers, [place]   1 year ago Cough   [facility] Birdie Sons, MD       Future Appointments             In 2 weeks Fisher, Kirstie Peri, MD [facility] [place]

## 2020-11-29 ENCOUNTER — Telehealth: Payer: Self-pay

## 2020-11-29 NOTE — Telephone Encounter (Signed)
James [name] called [name] to let us know that he will be out due to breathing issues and has made a pulmonologist appointment tomorrow morning. He does not believe he will be back to rehab before the new year. We will see what his pulmonologist says about returning, however, if he won't be back before the new year we will have to give away his spot and keep him on a medical hold.

## 2020-11-30 DIAGNOSIS — J432 Centrilobular emphysema: Secondary | ICD-10-CM | POA: Diagnosis not present

## 2020-12-01 ENCOUNTER — Telehealth: Payer: Self-pay

## 2020-12-01 NOTE — Telephone Encounter (Signed)
Called James [name] to check in regarding his pulmonary appointment. James [name] doesn't feel he will be back to rehab before the new year due to his breathing issues. He has a chest CT scheduled 01/20/21 to investigate further. He will let us know what is going on and when he will return. We will put him on a medical hold at this time and keep his spot in the program. We will have to give up his time slot at this time.

## 2020-12-06 DIAGNOSIS — J449 Chronic obstructive pulmonary disease, unspecified: Secondary | ICD-10-CM | POA: Diagnosis not present

## 2020-12-07 DIAGNOSIS — I5020 Unspecified systolic (congestive) heart failure: Secondary | ICD-10-CM | POA: Diagnosis not present

## 2020-12-07 DIAGNOSIS — E7800 Pure hypercholesterolemia, unspecified: Secondary | ICD-10-CM | POA: Diagnosis not present

## 2020-12-07 DIAGNOSIS — I480 Paroxysmal atrial fibrillation: Secondary | ICD-10-CM | POA: Diagnosis not present

## 2020-12-07 DIAGNOSIS — I2510 Atherosclerotic heart disease of native coronary artery without angina pectoris: Secondary | ICD-10-CM | POA: Diagnosis not present

## 2020-12-14 ENCOUNTER — Ambulatory Visit
Admission: RE | Admit: 2020-12-14 | Discharge: 2020-12-14 | Disposition: A | Discharge status: Home / Self Care | Payer: Medicare Other | Source: Ambulatory Visit | Attending: Family Medicine | Admitting: Family Medicine

## 2020-12-14 ENCOUNTER — Other Ambulatory Visit: Payer: Self-pay

## 2020-12-14 ENCOUNTER — Ambulatory Visit
Admission: RE | Admit: 2020-12-14 | Discharge: 2020-12-14 | Disposition: A | Discharge status: Home / Self Care | Payer: Medicare Other | Attending: Family Medicine | Admitting: Family Medicine

## 2020-12-14 ENCOUNTER — Ambulatory Visit: Payer: Medicare Other | Admitting: Family Medicine

## 2020-12-14 ENCOUNTER — Encounter: Payer: Self-pay | Admitting: Family Medicine

## 2020-12-14 VITALS — BP 100/54 | HR 70 | Temp 98.3°F | Resp 18 | Ht 64.0 in | Wt 177.0 lb

## 2020-12-14 DIAGNOSIS — R0609 Other forms of dyspnea: Secondary | ICD-10-CM

## 2020-12-14 DIAGNOSIS — R0601 Orthopnea: Secondary | ICD-10-CM

## 2020-12-14 DIAGNOSIS — R059 Cough, unspecified: Secondary | ICD-10-CM | POA: Insufficient documentation

## 2020-12-14 DIAGNOSIS — I5022 Chronic systolic (congestive) heart failure: Secondary | ICD-10-CM

## 2020-12-14 DIAGNOSIS — R0600 Dyspnea, unspecified: Secondary | ICD-10-CM | POA: Diagnosis not present

## 2020-12-14 DIAGNOSIS — R0989 Other specified symptoms and signs involving the circulatory and respiratory systems: Secondary | ICD-10-CM

## 2020-12-14 DIAGNOSIS — R0602 Shortness of breath: Secondary | ICD-10-CM | POA: Diagnosis not present

## 2020-12-14 DIAGNOSIS — J811 Chronic pulmonary edema: Secondary | ICD-10-CM | POA: Diagnosis not present

## 2020-12-14 DIAGNOSIS — J90 Pleural effusion, not elsewhere classified: Secondary | ICD-10-CM | POA: Diagnosis not present

## 2020-12-14 NOTE — Progress Notes (Signed)
I,April Miller,acting as a scribe for Lelon Huh, MD.,have documented all relevant documentation on the behalf of Lelon Huh, MD,as directed by  Lelon Huh, MD while in the presence of Lelon Huh, MD.   Established patient visit   Patient: James [name]   DOB: 03/21/1929   86 y.o. Male  MRN: [id number] Visit Date: 12/14/2020  Today's healthcare provider: Lelon Huh, MD   Chief Complaint  Patient presents with  . Follow-up  . Hyperlipidemia  . Gastroesophageal Reflux  . Atrial Fibrillation  . COPD   Subjective    HPI  Follow up for COPD:  The patient was last seen for this 6 weeks ago by Laverna Peace, FNP for COPD exacerbation. Changes made at last visit include treating with Augmentin and Prednisone. He has since had follow up with Dr. Lanney Gins. No changes were made in inhalers. He reports being referred to pulmonary rehab, but he hasn't been able to tolerate it. [name]   He reports good compliance with treatment. He feels that condition is Unchanged. He is not having side effects. none  --------------------------------------------------------------------  Follow up for Chronic systolic heart failure:  The patient was last seen for this 6 months ago. Changes made at last visit include none. He had been off benazepril/hctz since he was started on furosemide, but he anticipated going back on it when he finishes furosemide.  He was seen by [facility] 6 weeks ago with dyspnea and elevated BNP. Furosemide was increased for a few days. He's not sure if it helped his breathing.  He reports good compliance with treatment. He feels that condition is Unchanged. He is not having side effects. None  He states he is still short of breath all the time, worse when lying back.   Still gets short of breath with minimal exertion. Is worse when lying down. Improves but does not resolve when sitting up. Has persistent cough and sensation of chest congestion.   --------------------------------------------------------------------  Lipid/Cholesterol, Follow-up  Last lipid panel Other pertinent labs  Lab Results  Component Value Date   CHOL 99 12/03/2018   HDL 55 12/03/2018   LDLCALC 34 12/03/2018   TRIG 51 12/03/2018   CHOLHDL 1.8 12/03/2018   Lab Results  Component Value Date   ALT 49 (H) 10/25/2020   AST 56 (H) 10/25/2020   PLT 178 10/25/2020   TSH 0.673 07/13/2019     He was last seen for this 6 months ago.  Management since that visit includes continue same medication.  He reports good compliance with treatment. He is not having side effects. none  Symptoms: No chest pain No chest pressure/discomfort  No dyspnea No lower extremity edema  No numbness or tingling of extremity No orthopnea  No palpitations No paroxysmal nocturnal dyspnea  No speech difficulty No syncope   Current diet: in general, an "unhealthy" diet Current exercise: none  The ASCVD Risk score ([place]., et al., 2013) failed to calculate for the following reasons:   The 2013 ASCVD risk score is only valid for ages 76 to 11   The patient has a prior MI or stroke diagnosis  --------------------------------------------------------------------  Follow up for GERD:  The patient was last seen for this 6 months ago. Changes made at last visit include none. Symptoms were very well controlled with esomeprazole.  He reports good compliance with treatment. He feels that condition is Unchanged. He is not having side effects. none  --------------------------------------------------------------------  Follow up for Atrial Fibrillation:  The patient was last seen for this 6 months ago. Changes made at last visit include none.  Rate was well controlled. On NOAC and was tolerating well.  He reports good compliance with treatment. He feels that condition is Unchanged. He is not having side effects.  none  --------------------------------------------------------------------     Medications: Outpatient Medications Prior to Visit  Medication Sig  . albuterol (VENTOLIN HFA) 108 (90 Base) MCG/ACT inhaler Inhale 2 puffs into the lungs every 6 (six) hours as needed.  [name] atorvastatin (LIPITOR) 80 MG tablet TAKE 1 TABLET BY MOUTH DAILY AT 6PM  . azelastine (OPTIVAR) 0.05 % ophthalmic solution APPLY 1 DROP INTO THE EYE TWICE DAILY ASDIRECTED  . beta carotene w/minerals (OCUVITE) tablet Take 1 tablet by mouth daily.  . budesonide (PULMICORT) 0.5 MG/2ML nebulizer solution Take 2 mLs (0.5 mg total) by nebulization 2 (two) times daily. Dx:j44.9  . chlorpheniramine-HYDROcodone (TUSSIONEX) 10-8 MG/5ML SUER TAKE ONE TEASPOONFUL (5ML) EVERY TWELVE HOURS IF NEEDED FOR COUGH  . ELIQUIS 5 MG TABS tablet TAKE 1 TABLET BY MOUTH TWICE DAILY  . fluticasone (FLONASE) 50 MCG/ACT nasal spray Place 2 sprays into both nostrils 2 (two) times daily.  . furosemide (LASIX) 40 MG tablet Take 40 mg by mouth daily.  [name] ipratropium-albuterol (DUONEB) 0.5-2.5 (3) MG/3ML SOLN Take 3 mLs by nebulization 4 (four) times daily.   . lansoprazole (PREVACID) 30 MG capsule TAKE 1 CAPSULE BY MOUTH EVERY DAY  . LORazepam (ATIVAN) 1 MG tablet TAKE 1 TABLET BY MOUTH AT BEDTIME  . Magnesium 400 MG CAPS Take 1 capsule by mouth daily.   . metolazone (ZAROXOLYN) 5 MG tablet Take 5 mg by mouth daily.  . metoprolol tartrate (LOPRESSOR) 50 MG tablet TAKE ONE TABLET TWICE DAILY  . montelukast (SINGULAIR) 10 MG tablet TAKE 1 TABLET BY MOUTH AT BEDTIME  . MULTIPLE VITAMIN PO Take 1 tablet by mouth daily.   . Omega-3 Fatty Acids (FISH OIL) 1000 MG CAPS Take 1 capsule daily by mouth.  . tamsulosin (FLOMAX) 0.4 MG CAPS capsule Take 0.4 mg by mouth daily.  [name] amoxicillin-clavulanate (AUGMENTIN) 875-125 MG tablet Take 1 tablet by mouth 2 (two) times daily. (Patient not taking: Reported on 12/14/2020)  . azithromycin (ZITHROMAX) 250 MG tablet Take 250  mg by mouth 3 (three) times a week. (Patient not taking: Reported on 12/14/2020)  . potassium chloride (KLOR-CON) 10 MEQ tablet Take 10 mEq by mouth daily. (Patient not taking: Reported on 12/14/2020)  . predniSONE (STERAPRED UNI-PAK 21 TAB) 10 MG (21) TBPK tablet PO: Take 6 tablets on day 1:Take 5 tablets day 2:Take 4 tablets day 3: Take 3 tablets day 4:Take 2 tablets day five: 5 Take 1 tablet day 6 (Patient not taking: Reported on 12/14/2020)   No facility-administered medications prior to visit.    Review of Systems  Constitutional: Negative for appetite change, chills and fever.  Respiratory: Negative for chest tightness, shortness of breath and wheezing.   Cardiovascular: Negative for chest pain and palpitations.  Gastrointestinal: Negative for abdominal pain, nausea and vomiting.      Objective    BP (!) 100/54 (BP Location: Right Arm, Patient Position: Sitting, Cuff Size: Large)   Pulse 70   Temp 98.3 F (36.8 C) (Oral)   Resp 18   Ht 5\' 4"  (1.626 m)   Wt 177 lb (80.3 kg)   SpO2 96%   BMI 30.38 kg/m    Physical Exam    General: Appearance:    Overweight male in no  acute distress  Eyes:    PERRL, conjunctiva/corneas clear, EOM's intact       Lungs:     Scattered rhonchi, more so in left upper lung fields.   Heart:    Normal heart rate. Irregularly irregular rhythm. No murmurs, rubs, or gallops.   MS:   All extremities are intact. 2+   Neurologic:   Awake, alert, oriented x 3. No apparent focal neurological           defect.         Assessment & Plan     1. Cough  - DG Chest 2 View; Future  2. Dyspnea on exertion  - DG Chest 2 View; Future  3. Orthopnea Multifactorial. He has significant lung and cardiac disease. Anticipate increasing diuretics after reviewing XR.   - DG Chest 2 View; Future  4. Chest congestion   5. Chronic systolic heart failure (HCC)         The entirety of the information documented in the History of Present Illness, Review of  Systems and Physical Exam were personally obtained by me. Portions of this information were initially documented by the CMA and reviewed by me for thoroughness and accuracy.      Lelon Huh, MD  [facility] [phone] (phone) [phone] (fax)  [place]

## 2020-12-14 NOTE — Patient Instructions (Addendum)
.   Please review the attached list of medications and notify my office if there are any errors.   You can take OTC Mucinex (guaifenesin) for chest or sinus congestion and to help your cough

## 2020-12-15 ENCOUNTER — Encounter: Payer: Self-pay | Admitting: *Deleted

## 2020-12-15 DIAGNOSIS — J449 Chronic obstructive pulmonary disease, unspecified: Secondary | ICD-10-CM

## 2020-12-15 NOTE — Progress Notes (Signed)
Pulmonary Individual Treatment Plan  Patient Details  Name: James [name] MRN: [id number] Date of Birth: 1929/08/20 Referring Provider:   Flowsheet Row Pulmonary Rehab from 10/18/2020 in [facility] Cardiac and Pulmonary Rehab  Referring Provider Lanney Gins      Initial Encounter Date:  Flowsheet Row Pulmonary Rehab from 10/18/2020 in [facility] Cardiac and Pulmonary Rehab  Date 10/18/20      Visit Diagnosis: Chronic obstructive pulmonary disease, unspecified COPD type ([place])  Patient's Home Medications on Admission:  Current Outpatient Medications:  .  albuterol (VENTOLIN HFA) 108 (90 Base) MCG/ACT inhaler, Inhale 2 puffs into the lungs every 6 (six) hours as needed., Disp: , Rfl:  .  amoxicillin-clavulanate (AUGMENTIN) 875-125 MG tablet, Take 1 tablet by mouth 2 (two) times daily. (Patient not taking: Reported on 12/14/2020), Disp: 20 tablet, Rfl: 0 .  atorvastatin (LIPITOR) 80 MG tablet, TAKE 1 TABLET BY MOUTH DAILY AT 6PM, Disp: 90 tablet, Rfl: 3 .  azelastine (OPTIVAR) 0.05 % ophthalmic solution, APPLY 1 DROP INTO THE EYE TWICE DAILY ASDIRECTED, Disp: 6 mL, Rfl: 5 .  azithromycin (ZITHROMAX) 250 MG tablet, Take 250 mg by mouth 3 (three) times a week. (Patient not taking: Reported on 12/14/2020), Disp: , Rfl:  .  beta carotene w/minerals (OCUVITE) tablet, Take 1 tablet by mouth daily., Disp: , Rfl:  .  budesonide (PULMICORT) 0.5 MG/2ML nebulizer solution, Take 2 mLs (0.5 mg total) by nebulization 2 (two) times daily. Dx:j44.9, Disp: 75 mL, Rfl: 12 .  chlorpheniramine-HYDROcodone (TUSSIONEX) 10-8 MG/5ML SUER, TAKE ONE TEASPOONFUL (5ML) EVERY TWELVE HOURS IF NEEDED FOR COUGH, Disp: 115 mL, Rfl: 0 .  ELIQUIS 5 MG TABS tablet, TAKE 1 TABLET BY MOUTH TWICE DAILY, Disp: 180 tablet, Rfl: 4 .  fluticasone (FLONASE) 50 MCG/ACT nasal spray, Place 2 sprays into both nostrils 2 (two) times daily., Disp: 48 g, Rfl: 3 .  furosemide (LASIX) 40 MG tablet, Take 40 mg by mouth daily., Disp: , Rfl:  .   ipratropium-albuterol (DUONEB) 0.5-2.5 (3) MG/3ML SOLN, Take 3 mLs by nebulization 4 (four) times daily. , Disp: , Rfl:  .  lansoprazole (PREVACID) 30 MG capsule, TAKE 1 CAPSULE BY MOUTH EVERY DAY, Disp: 90 capsule, Rfl: 3 .  LORazepam (ATIVAN) 1 MG tablet, TAKE 1 TABLET BY MOUTH AT BEDTIME, Disp: 30 tablet, Rfl: 5 .  Magnesium 400 MG CAPS, Take 1 capsule by mouth daily. , Disp: , Rfl:  .  metolazone (ZAROXOLYN) 5 MG tablet, Take 5 mg by mouth daily., Disp: , Rfl:  .  metoprolol tartrate (LOPRESSOR) 50 MG tablet, TAKE ONE TABLET TWICE DAILY, Disp: 60 tablet, Rfl: 0 .  montelukast (SINGULAIR) 10 MG tablet, TAKE 1 TABLET BY MOUTH AT BEDTIME, Disp: 90 tablet, Rfl: 0 .  MULTIPLE VITAMIN PO, Take 1 tablet by mouth daily. , Disp: , Rfl:  .  Omega-3 Fatty Acids (FISH OIL) 1000 MG CAPS, Take 1 capsule daily by mouth., Disp: , Rfl:  .  predniSONE (STERAPRED UNI-PAK 21 TAB) 10 MG (21) TBPK tablet, PO: Take 6 tablets on day 1:Take 5 tablets day 2:Take 4 tablets day 3: Take 3 tablets day 4:Take 2 tablets day five: 5 Take 1 tablet day 6 (Patient not taking: Reported on 12/14/2020), Disp: 21 tablet, Rfl: 0 .  tamsulosin (FLOMAX) 0.4 MG CAPS capsule, Take 0.4 mg by mouth daily., Disp: , Rfl:   Past Medical History: Past Medical History:  Diagnosis Date  . Arthritis   . Back pain   . CAD (coronary artery disease)   .  CAP (community acquired pneumonia) 11/06/2017  . Cataract    right  . COPD (chronic obstructive pulmonary disease) ([place])    SPiriva and SYmbicort daily. Albuterol as needed  . Diverticulosis   . Dyspnea    with exertion  . Enlarged prostate    takes Flomax daily  . GERD (gastroesophageal reflux disease)   . History of chicken pox   . History of gout   . History of measles   . History of mumps   . HOH (hard of hearing)   . Hyperglycemia   . Hyperlipidemia    takes Atorvastatin daily  . Hypertension    takes Metoprolol daily as well as Lotensin HCT  . Hypotension   . Joint pain    . Microscopic colitis   . PAF (paroxysmal atrial fibrillation) ([place]), RVR 03/14/2017  . Pneumonia   . Prostate cancer ([place])   . Stroke [facility])    TIA  . TIA (transient ischemic attack)   . Weakness    numbness and tingling.mainly on right    Tobacco Use: Social History   Tobacco Use  Smoking Status Former Smoker  . Packs/day: 1.00  . Years: 11.00  . Pack years: 11.00  . Types: Cigarettes  . Quit date: 02/12/1984  . Years since quitting: 36.8  Smokeless Tobacco Never Used  Tobacco Comment   quit smoking 30 yrs ago    Labs: Recent Review Flowsheet Data    Labs for ITP Cardiac and Pulmonary Rehab Latest Ref Rng & Units 10/05/2014 10/07/2014 10/31/2017 12/03/2018 07/13/2019   Cholestrol 0 - 200 mg/dL - 104 106 99 -   LDLCALC 0 - 99 mg/dL - 40 45 34 -   HDL >40 mg/dL - 52 52 55 -   Trlycerides <150 mg/dL - 61 47 51 -   Hemoglobin A1c 4.8 - 5.6 % 5.7 5.7 6.1(H) - 6.1(H)   HCO3 20.0 - 28.0 mmol/L - - - - 30.1(H)   O2SAT % - - - - 75.8       Pulmonary Assessment Scores:  Pulmonary Assessment Scores    Row Name 10/18/20 1600         ADL UCSD   SOB Score total 59     Rest 1     Walk 3     Stairs 4     Bath 2     Dress 3     Shop 2           CAT Score   CAT Score 19           mMRC Score   mMRC Score 1            UCSD: Self-administered rating of dyspnea associated with activities of daily living (ADLs) 6-point scale (0 = "not at all" to 5 = "maximal or unable to do because of breathlessness")  Scoring Scores range from 0 to 120.  Minimally important difference is 5 units  CAT: CAT can identify the health impairment of COPD patients and is better correlated with disease progression.  CAT has a scoring range of zero to 40. The CAT score is classified into four groups of low (less than 10), medium (10 - 20), high (21-30) and very high (31-40) based on the impact level of disease on health status. A CAT score over 10 suggests significant symptoms.  A worsening  CAT score could be explained by an exacerbation, poor medication adherence, poor inhaler technique, or progression of COPD or comorbid conditions.  CAT MCID is 2 points  mMRC: mMRC (Modified Medical Research Council) Dyspnea Scale is used to assess the degree of baseline functional disability in patients of respiratory disease due to dyspnea. No minimal important difference is established. A decrease in score of 1 point or greater is considered a positive change.   Pulmonary Function Assessment:   Exercise Target Goals: Exercise Program Goal: Individual exercise prescription set using results from initial 6 min walk test and THRR while considering  patient's activity barriers and safety.   Exercise Prescription Goal: Initial exercise prescription builds to 30-45 minutes a day of aerobic activity, 2-3 days per week.  Home exercise guidelines will be given to patient during program as part of exercise prescription that the participant will acknowledge.  Education: Aerobic Exercise: - Group verbal and visual presentation on the components of exercise prescription. Introduces F.I.T.T principle from ACSM for exercise prescriptions.  Reviews F.I.T.T. principles of aerobic exercise including progression. Written material given at graduation. Flowsheet Row Pulmonary Rehab from 02/21/2017 in [facility] Cardiac and Pulmonary Rehab  Date 01/17/17  Educator [facility]  Instruction Review Code (retired) 2- meets goals/outcomes      Education: Resistance Exercise: - Group verbal and visual presentation on the components of exercise prescription. Introduces F.I.T.T principle from ACSM for exercise prescriptions  Reviews F.I.T.T. principles of resistance exercise including progression. Written material given at graduation.    Education: Exercise & Equipment Safety: - Individual verbal instruction and demonstration of equipment use and safety with use of the equipment. Flowsheet Row Pulmonary Rehab from 11/04/2020 in  [facility] Cardiac and Pulmonary Rehab  Date 10/18/20  Educator AS  Instruction Review Code 1- Verbalizes Understanding      Education: Exercise Physiology & General Exercise Guidelines: - Group verbal and written instruction with models to review the exercise physiology of the cardiovascular system and associated critical values. Provides general exercise guidelines with specific guidelines to those with heart or lung disease.  Flowsheet Row Pulmonary Rehab from 02/21/2017 in [facility] Cardiac and Pulmonary Rehab  Date 12/20/16  Educator [facility]  Instruction Review Code (retired) 2- meets goals/outcomes      Education: Flexibility, Balance, Mind/Body Relaxation: - Group verbal and visual presentation with interactive activity on the components of exercise prescription. Introduces F.I.T.T principle from ACSM for exercise prescriptions. Reviews F.I.T.T. principles of flexibility and balance exercise training including progression. Also discusses the mind body connection.  Reviews various relaxation techniques to help reduce and manage stress (i.e. Deep breathing, progressive muscle relaxation, and visualization). Balance handout provided to take home. Written material given at graduation. Flowsheet Row Pulmonary Rehab from 11/01/2018 in [facility] Cardiac and Pulmonary Rehab  Date 10/09/18  Educator AS  Instruction Review Code 1- Verbalizes Understanding      Activity Barriers & Risk Stratification:  Activity Barriers & Cardiac Risk Stratification - 10/15/20 1305      Activity Barriers & Cardiac Risk Stratification   Activity Barriers Back Problems;Arthritis;Shortness of Breath           6 Minute Walk:  6 Minute Walk    Row Name 10/18/20 1546         6 Minute Walk   Phase Initial     Distance 495 feet     Walk Time 5.3 minutes     # of Rest Breaks 1     MPH 1.06     METS 1.77     RPE 19     Perceived Dyspnea  3     VO2 Peak 0.6  Symptoms Yes (comment)     Comments shortness of breath      Resting HR 98 bpm     Resting BP 108/52     Resting Oxygen Saturation  95 %     Exercise Oxygen Saturation  during 6 min walk 89 %     Max Ex. HR 110 bpm     Max Ex. BP 116/54     2 Minute Post BP 104/56           Interval HR   1 Minute HR 98     2 Minute HR 104     3 Minute HR 108     4 Minute HR 108     5 Minute HR 109     6 Minute HR 110     2 Minute Post HR 99     Interval Heart Rate? Yes           Interval Oxygen   Interval Oxygen? Yes     Baseline Oxygen Saturation % 95 %     1 Minute Oxygen Saturation % 93 %     1 Minute Liters of Oxygen 0 L     2 Minute Oxygen Saturation % 90 %     2 Minute Liters of Oxygen 0 L     3 Minute Oxygen Saturation % 90 %     3 Minute Liters of Oxygen 0 L     4 Minute Oxygen Saturation % 93 %     4 Minute Liters of Oxygen 0 L     5 Minute Oxygen Saturation % 93 %     5 Minute Liters of Oxygen 0 L     6 Minute Oxygen Saturation % 91 %     6 Minute Liters of Oxygen 0 L     2 Minute Post Oxygen Saturation % 95 %     2 Minute Post Liters of Oxygen 0 L           Oxygen Initial Assessment:  Oxygen Initial Assessment - 11/02/20 1023      Home Oxygen   Home Oxygen Device Home Concentrator    Sleep Oxygen Prescription Continuous    Liters per minute 2    Home Exercise Oxygen Prescription None    Home Resting Oxygen Prescription None    Compliance with Home Oxygen Use Yes      Intervention   Short Term Goals To learn and demonstrate proper pursed lip breathing techniques or other breathing techniques.    Long  Term Goals --           Oxygen Re-Evaluation:  Oxygen Re-Evaluation    [place] Name 11/02/20 1025             Program Oxygen Prescription   Program Oxygen Prescription None               Home Oxygen   Sleep Oxygen Prescription Continuous       Liters per minute 2       Home Exercise Oxygen Prescription None       Home Resting Oxygen Prescription None       Compliance with Home Oxygen Use Yes                Goals/Expected Outcomes   Short Term Goals To learn and demonstrate proper pursed lip breathing techniques or other breathing techniques.       Long  Term Goals Exhibits proper breathing techniques, such as  pursed lip breathing or other method taught during program session       Comments Reviewed PLB technique with pt.  Talked about how it works and it's importance in maintaining their exercise saturations.       Goals/Expected Outcomes Short: Become more profiecient at using PLB.   Long: Become independent at using PLB.              Oxygen Discharge (Final Oxygen Re-Evaluation):  Oxygen Re-Evaluation - 11/02/20 1025      Program Oxygen Prescription   Program Oxygen Prescription None      Home Oxygen   Sleep Oxygen Prescription Continuous    Liters per minute 2    Home Exercise Oxygen Prescription None    Home Resting Oxygen Prescription None    Compliance with Home Oxygen Use Yes      Goals/Expected Outcomes   Short Term Goals To learn and demonstrate proper pursed lip breathing techniques or other breathing techniques.    Long  Term Goals Exhibits proper breathing techniques, such as pursed lip breathing or other method taught during program session    Comments Reviewed PLB technique with pt.  Talked about how it works and it's importance in maintaining their exercise saturations.    Goals/Expected Outcomes Short: Become more profiecient at using PLB.   Long: Become independent at using PLB.           Initial Exercise Prescription:  Initial Exercise Prescription - 10/18/20 1500      Date of Initial Exercise RX and Referring Provider   Date 10/18/20    Referring Provider Aleskerov      Treadmill   MPH 1    Grade 0    Minutes 15    METs 1.77      Recumbant Bike   Level 1    RPM 60    Minutes 15    METs 1.5      NuStep   Level 1    SPM 80    Minutes 15    METs 1.5      Arm Ergometer   Level 1    RPM 30    Minutes 15    METs 1.5      T5 Nustep   Level  1    SPM 80    Minutes 15    METs 1.5      Prescription Details   Frequency (times per week) 3    Duration Progress to 30 minutes of continuous aerobic without signs/symptoms of physical distress      Intensity   THRR 40-80% of Max Heartrate 111-124    Ratings of Perceived Exertion 11-13    Perceived Dyspnea 0-4      Progression   Progression Continue to progress workloads to maintain intensity without signs/symptoms of physical distress.      Resistance Training   Training Prescription Yes    Weight 3 lb    Reps 10-15           Perform Capillary Blood Glucose checks as needed.  Exercise Prescription Changes:  Exercise Prescription Changes    Row Name 10/18/20 1500 11/17/20 1200 11/30/20 0900         Response to Exercise   Blood Pressure (Admit) 108/52 112/66 106/56     Blood Pressure (Exercise) 116/54 110/60 122/62     Blood Pressure (Exit) 104/56 114/64 108/60     Heart Rate (Admit) 98 bpm 95 bpm 77 bpm     Heart  Rate (Exercise) 110 bpm 104 bpm 113 bpm     Heart Rate (Exit) 99 bpm 89 bpm 68 bpm     Oxygen Saturation (Admit) 95 % 89 % 91 %     Oxygen Saturation (Exercise) 89 % 90 % 94 %     Oxygen Saturation (Exit) 95 % 93 % 96 %     Rating of Perceived Exertion (Exercise) 19 12 12      Perceived Dyspnea (Exercise) 3 2 3      Symptoms SOB SOB SOB     Comments -- 3rd exercise day --     Duration -- Progress to 30 minutes of  aerobic without signs/symptoms of physical distress Progress to 30 minutes of  aerobic without signs/symptoms of physical distress     Intensity -- THRR unchanged THRR unchanged           Progression   Progression -- Continue to progress workloads to maintain intensity without signs/symptoms of physical distress. Continue to progress workloads to maintain intensity without signs/symptoms of physical distress.     Average METs -- 2.55 1.77           Resistance Training   Training Prescription -- Yes Yes     Weight --  3lb 3 lb     Reps --  10-15 10-15           Interval Training   Interval Training -- No No           Treadmill   MPH -- -- 0.9     Grade -- -- 0     Minutes -- -- 15     METs -- -- 1.77           Recumbant Bike   Level -- 1 --     RPM -- 60 --     Minutes -- 15 --     METs -- 2.97 --           NuStep   Level -- 1 --     SPM -- 80 --     Minutes -- 15 --     METs -- 2.1 --            Exercise Comments:  Exercise Comments    Row Name 11/02/20 1022           Exercise Comments First full day of exercise!  Patient was oriented to gym and equipment including functions, settings, policies, and procedures.  Patient's individual exercise prescription and treatment plan were reviewed.  All starting workloads were established based on the results of the 6 minute walk test done at initial orientation visit.  The plan for exercise progression was also introduced and progression will be customized based on patient's performance and goals.              Exercise Goals and Review:  Exercise Goals    Row Name 10/18/20 1558             Exercise Goals   Increase Physical Activity Yes       Intervention Provide advice, education, support and counseling about physical activity/exercise needs.;Develop an individualized exercise prescription for aerobic and resistive training based on initial evaluation findings, risk stratification, comorbidities and participant's personal goals.       Expected Outcomes Short Term: Attend rehab on a regular basis to increase amount of physical activity.;Long Term: Add in home exercise to make exercise part of routine and to increase amount of physical activity.;Long Term:  Exercising regularly at least 3-5 days a week.       Increase Strength and Stamina Yes       Intervention Provide advice, education, support and counseling about physical activity/exercise needs.;Develop an individualized exercise prescription for aerobic and resistive training based on initial  evaluation findings, risk stratification, comorbidities and participant's personal goals.       Expected Outcomes Short Term: Increase workloads from initial exercise prescription for resistance, speed, and METs.;Short Term: Perform resistance training exercises routinely during rehab and add in resistance training at home;Long Term: Improve cardiorespiratory fitness, muscular endurance and strength as measured by increased METs and functional capacity (6MWT)       Able to understand and use rate of perceived exertion (RPE) scale Yes       Intervention Provide education and explanation on how to use RPE scale       Expected Outcomes Short Term: Able to use RPE daily in rehab to express subjective intensity level;Long Term:  Able to use RPE to guide intensity level when exercising independently       Able to understand and use Dyspnea scale Yes       Intervention Provide education and explanation on how to use Dyspnea scale       Expected Outcomes Short Term: Able to use Dyspnea scale daily in rehab to express subjective sense of shortness of breath during exertion;Long Term: Able to use Dyspnea scale to guide intensity level when exercising independently       Knowledge and understanding of Target Heart Rate Range (THRR) Yes       Intervention Provide education and explanation of THRR including how the numbers were predicted and where they are located for reference       Expected Outcomes Short Term: Able to state/look up THRR;Short Term: Able to use daily as guideline for intensity in rehab;Long Term: Able to use THRR to govern intensity when exercising independently       Able to check pulse independently Yes       Intervention Review the importance of being able to check your own pulse for safety during independent exercise       Expected Outcomes Short Term: Able to explain why pulse checking is important during independent exercise;Long Term: Able to check pulse independently and accurately        Understanding of Exercise Prescription Yes       Intervention Provide education, explanation, and written materials on patient's individual exercise prescription       Expected Outcomes Short Term: Able to explain program exercise prescription;Long Term: Able to explain home exercise prescription to exercise independently              Exercise Goals Re-Evaluation :  Exercise Goals Re-Evaluation    Row Name 11/02/20 1022 11/17/20 1232 11/30/20 0906         Exercise Goal Re-Evaluation   Exercise Goals Review Able to understand and use rate of perceived exertion (RPE) scale;Able to understand and use Dyspnea scale;Knowledge and understanding of Target Heart Rate Range (THRR);Understanding of Exercise Prescription Increase Physical Activity;Increase Strength and Stamina Increase Physical Activity;Increase Strength and Stamina     Comments Reviewed RPE and dyspnea scales, THR and program prescription with pt today.  Pt voiced understanding and was given a copy of goals to take home. Richie has done well so far with exercise.  Lowest oxygen has been 89%.  Staff will monitor progress. Orren has had more SOB recently and is seeing his  Dr.  Aldean Ast will follow up when he returns     Expected Outcomes Short: Use RPE daily to regulate intensity. Long: Follow program prescription in THR. Short:  attend consistently Long: increase overall stamina Short: check in with Dr about increased SOB Long: get back to [facility]            Discharge Exercise Prescription (Final Exercise Prescription Changes):  Exercise Prescription Changes - 11/30/20 0900      Response to Exercise   Blood Pressure (Admit) 106/56    Blood Pressure (Exercise) 122/62    Blood Pressure (Exit) 108/60    Heart Rate (Admit) 77 bpm    Heart Rate (Exercise) 113 bpm    Heart Rate (Exit) 68 bpm    Oxygen Saturation (Admit) 91 %    Oxygen Saturation (Exercise) 94 %    Oxygen Saturation (Exit) 96 %    Rating of Perceived Exertion (Exercise) 12     Perceived Dyspnea (Exercise) 3    Symptoms SOB    Duration Progress to 30 minutes of  aerobic without signs/symptoms of physical distress    Intensity THRR unchanged      Progression   Progression Continue to progress workloads to maintain intensity without signs/symptoms of physical distress.    Average METs 1.77      Resistance Training   Training Prescription Yes    Weight 3 lb    Reps 10-15      Interval Training   Interval Training No      Treadmill   MPH 0.9    Grade 0    Minutes 15    METs 1.77           Nutrition:  Target Goals: Understanding of nutrition guidelines, daily intake of sodium 1500mg , cholesterol 200mg , calories 30% from fat and 7% or less from saturated fats, daily to have 5 or more servings of fruits and vegetables.  Education: All About Nutrition: -Group instruction provided by verbal, written material, interactive activities, discussions, models, and posters to present general guidelines for heart healthy nutrition including fat, fiber, MyPlate, the role of sodium in heart healthy nutrition, utilization of the nutrition label, and utilization of this knowledge for meal planning. Follow up email sent as well. Written material given at graduation. Flowsheet Row Pulmonary Rehab from 11/01/2018 in [facility] Cardiac and Pulmonary Rehab  Date 10/23/18  Educator LB  Instruction Review Code 1- Verbalizes Understanding      Biometrics:  Pre Biometrics - 10/18/20 1559      Pre Biometrics   Height 5' 3.5" (1.613 m)    Weight 177 lb 8 oz (80.5 kg)    BMI (Calculated) 30.95    Single Leg Stand 2.31 seconds            Nutrition Therapy Plan and Nutrition Goals:   Nutrition Assessments:  Nutrition Assessments - 10/18/20 1606      MEDFICTS Scores   Pre Score 16          MEDIFICTS Score Key:  ?70 Need to make dietary changes   40-70 Heart Healthy Diet  ? 40 Therapeutic Level Cholesterol Diet   Picture Your Plate Scores:  <60  Unhealthy dietary pattern with much room for improvement.  41-50 Dietary pattern unlikely to meet recommendations for good health and room for improvement.  51-60 More healthful dietary pattern, with some room for improvement.   >60 Healthy dietary pattern, although there may be some specific behaviors that could be improved.   Nutrition Goals  Re-Evaluation:   Nutrition Goals Discharge (Final Nutrition Goals Re-Evaluation):   Psychosocial: Target Goals: Acknowledge presence or absence of significant depression and/or stress, maximize coping skills, provide positive support system. Participant is able to verbalize types and ability to use techniques and skills needed for reducing stress and depression.   Education: Stress, Anxiety, and Depression - Group verbal and visual presentation to define topics covered.  Reviews how body is impacted by stress, anxiety, and depression.  Also discusses healthy ways to reduce stress and to treat/manage anxiety and depression.  Written material given at graduation. Flowsheet Row Pulmonary Rehab from 11/01/2018 in [facility] Cardiac and Pulmonary Rehab  Date 10/02/18  Educator [facility]  Instruction Review Code 1- [employer] Understanding      Education: Sleep Hygiene -Provides group verbal and written instruction about how sleep can affect your health.  Define sleep hygiene, discuss sleep cycles and impact of sleep habits. Review good sleep hygiene tips.    Initial Review & Psychosocial Screening:  Initial Psych Review & Screening - 10/15/20 1307      Initial Review   Current issues with Current Sleep Concerns      Family Dynamics   Good Support System? Yes   wife     Barriers   Psychosocial barriers to participate in program There are no identifiable barriers or psychosocial needs.;The patient should benefit from training in stress management and relaxation.      Screening Interventions   Interventions Encouraged to exercise;To provide support and  resources with identified psychosocial needs;Provide feedback about the scores to participant    Expected Outcomes Short Term goal: Utilizing psychosocial counselor, staff and physician to assist with identification of specific Stressors or current issues interfering with healing process. Setting desired goal for each stressor or current issue identified.;Long Term Goal: Stressors or current issues are controlled or eliminated.;Short Term goal: Identification and review with participant of any Quality of Life or Depression concerns found by scoring the questionnaire.;Long Term goal: The participant improves quality of Life and PHQ9 Scores as seen by post scores and/or verbalization of changes           Quality of Life Scores:  Scores of 19 and below usually indicate a poorer quality of life in these areas.  A difference of  2-3 points is a clinically meaningful difference.  A difference of 2-3 points in the total score of the Quality of Life Index has been associated with significant improvement in overall quality of life, self-image, physical symptoms, and general health in studies assessing change in quality of life.  PHQ-9: Recent Review Flowsheet Data    Depression screen [facility] 2/9 12/14/2020 10/18/2020 07/12/2020 06/14/2020 09/24/2018   Decreased Interest 0 2  0 0 0   Down, Depressed, Hopeless 0 0 0 0 0   PHQ - 2 Score 0 2 0 0 0   Altered sleeping 2 2  - - 0   Tired, decreased energy 2 2  - - 1   Change in appetite 0 1 - - 2   Feeling bad or failure about yourself  1 0 - - 0   Trouble concentrating 0 1 - - 0   Moving slowly or fidgety/restless 2 0 - - 0   Suicidal thoughts 0 0 - - 0   PHQ-9 Score 7 8 - - 3   Difficult doing work/chores Very difficult Somewhat difficult - - Not difficult at all     Interpretation of Total Score  Total Score Depression Severity:  1-4 = Minimal depression, 5-9 = Mild depression, 10-14 = Moderate depression, 15-19 = Moderately severe depression, 20-27 =  Severe depression   Psychosocial Evaluation and Intervention:  Psychosocial Evaluation - 10/15/20 1319      Psychosocial Evaluation & Interventions   Interventions Encouraged to exercise with the program and follow exercise prescription    Comments Jama is a returning patient who hopes to improve his breathing with exercise while here again. His wife is a huge support. This last year has been difficult with Covid restrictions and he is still receiving treatment for his lung cancer. He states he is overall doing as well as can be besides his breathing affecting his sleep some nights.    Expected Outcomes Short: attend pulmonary rehab for educaiton and exercise. Long: maintain positive self care habits.    Continue Psychosocial Services  Follow up required by staff           Psychosocial Re-Evaluation:   Psychosocial Discharge (Final Psychosocial Re-Evaluation):   Education: Education Goals: Education classes will be provided on a weekly basis, covering required topics. Participant will state understanding/return demonstration of topics presented.  Learning Barriers/Preferences:  Learning Barriers/Preferences - 10/15/20 1307      Learning Barriers/Preferences   Learning Barriers Hearing    Learning Preferences None           General Pulmonary Education Topics:  Infection Prevention: - Provides verbal and written material to individual with discussion of infection control including proper hand washing and proper equipment cleaning during exercise session. Flowsheet Row Pulmonary Rehab from 11/04/2020 in [facility] Cardiac and Pulmonary Rehab  Date 10/18/20  Educator AS  Instruction Review Code 1- Verbalizes Understanding      Falls Prevention: - Provides verbal and written material to individual with discussion of falls prevention and safety. Flowsheet Row Pulmonary Rehab from 11/04/2020 in [facility] Cardiac and Pulmonary Rehab  Date 10/18/20  Educator AS  Instruction Review Code  1- Verbalizes Understanding      Chronic Lung Disease Review: - Group verbal instruction with posters, models, PowerPoint presentations and videos,  to review new updates, new respiratory medications, new advancements in procedures and treatments. Providing information on websites and "800" numbers for continued self-education. Includes information about supplement oxygen, available portable oxygen systems, continuous and intermittent flow rates, oxygen safety, concentrators, and Medicare reimbursement for oxygen. Explanation of Pulmonary Drugs, including class, frequency, complications, importance of spacers, rinsing mouth after steroid MDI's, and proper cleaning methods for nebulizers. Review of basic lung anatomy and physiology related to function, structure, and complications of lung disease. Review of risk factors. Discussion about methods for diagnosing sleep apnea and types of masks and machines for OSA. Includes a review of the use of types of environmental controls: home humidity, furnaces, filters, dust mite/pet prevention, HEPA vacuums. Discussion about weather changes, air quality and the benefits of nasal washing. Instruction on Warning signs, infection symptoms, calling MD promptly, preventive modes, and value of vaccinations. Review of effective airway clearance, coughing and/or vibration techniques. Emphasizing that all should Create an Action Plan. Written material given at graduation. Flowsheet Row Pulmonary Rehab from 02/21/2017 in [facility] Cardiac and Pulmonary Rehab  Date 01/10/17  Educator LB  Instruction Review Code (retired) 2- meets goals/outcomes      AED/CPR: - Group verbal and written instruction with the use of models to demonstrate the basic use of the AED with the basic ABC's of resuscitation.    Anatomy and Cardiac Procedures: - Group verbal and visual presentation and models  provide information about basic cardiac anatomy and function. Reviews the testing methods done to  diagnose heart disease and the outcomes of the test results. Describes the treatment choices: Medical Management, Angioplasty, or Coronary Bypass Surgery for treating various heart conditions including Myocardial Infarction, Angina, Valve Disease, and Cardiac Arrhythmias.  Written material given at graduation. Flowsheet Row Pulmonary Rehab from 02/21/2017 in [facility] Cardiac and Pulmonary Rehab  Date 02/02/17  Educator CE  Instruction Review Code (retired) 2- meets goals/outcomes      Medication Safety: - Group verbal and visual instruction to review commonly prescribed medications for heart and lung disease. Reviews the medication, class of the drug, and side effects. Includes the steps to properly store meds and maintain the prescription regimen.  Written material given at graduation. Flowsheet Row Pulmonary Rehab from 11/04/2020 in [facility] Cardiac and Pulmonary Rehab  Date 11/04/20  Educator [facility]  Instruction Review Code 1- Verbalizes Understanding      Other: -Provides group and verbal instruction on various topics (see comments)   Knowledge Questionnaire Score:  Knowledge Questionnaire Score - 10/18/20 1605      Knowledge Questionnaire Score   Pre Score 15/18 oxygen            Core Components/Risk Factors/Patient Goals at Admission:  Personal Goals and Risk Factors at Admission - 10/18/20 1606      Core Components/Risk Factors/Patient Goals on Admission    Weight Management Yes;Weight Loss    Intervention Weight Management: Develop a combined nutrition and exercise program designed to reach desired caloric intake, while maintaining appropriate intake of nutrient and fiber, sodium and fats, and appropriate energy expenditure required for the weight goal.;Weight Management: Provide education and appropriate resources to help participant work on and attain dietary goals.;Weight Management/Obesity: Establish reasonable short term and long term weight goals.    Admit Weight 177 lb 8 oz  (80.5 kg)    Goal Weight: Short Term 175 lb (79.4 kg)    Goal Weight: Long Term 170 lb (77.1 kg)    Expected Outcomes Short Term: Continue to assess and modify interventions until short term weight is achieved;Long Term: Adherence to nutrition and physical activity/exercise program aimed toward attainment of established weight goal;Weight Loss: Understanding of general recommendations for a balanced deficit meal plan, which promotes 1-2 lb weight loss per week and includes a negative energy balance of [phone] kcal/d;Understanding recommendations for meals to include 15-35% energy as protein, 25-35% energy from fat, 35-60% energy from carbohydrates, less than 200mg  of dietary cholesterol, 20-35 gm of total fiber daily;Understanding of distribution of calorie intake throughout the day with the consumption of 4-5 meals/snacks    Improve shortness of breath with ADL's Yes    Intervention Provide education, individualized exercise plan and daily activity instruction to help decrease symptoms of SOB with activities of daily living.    Expected Outcomes Short Term: Improve cardiorespiratory fitness to achieve a reduction of symptoms when performing ADLs;Long Term: Be able to perform more ADLs without symptoms or delay the onset of symptoms    Heart Failure Yes    Intervention Provide a combined exercise and nutrition program that is supplemented with education, support and counseling about heart failure. Directed toward relieving symptoms such as shortness of breath, decreased exercise tolerance, and extremity edema.    Expected Outcomes Improve functional capacity of life;Short term: Attendance in program 2-3 days a week with increased exercise capacity. Reported lower sodium intake. Reported increased fruit and vegetable intake. Reports medication compliance.;Short term: Daily weights obtained and  reported for increase. Utilizing diuretic protocols set by physician.;Long term: Adoption of self-care skills and  reduction of barriers for early signs and symptoms recognition and intervention leading to self-care maintenance.    Hypertension Yes    Intervention Provide education on lifestyle modifcations including regular physical activity/exercise, weight management, moderate sodium restriction and increased consumption of fresh fruit, vegetables, and low fat dairy, alcohol moderation, and smoking cessation.;Monitor prescription use compliance.    Lipids Yes    Intervention Provide education and support for participant on nutrition & aerobic/resistive exercise along with prescribed medications to achieve LDL 70mg , HDL >40mg .    Expected Outcomes Short Term: Participant states understanding of desired cholesterol values and is compliant with medications prescribed. Participant is following exercise prescription and nutrition guidelines.;Long Term: Cholesterol controlled with medications as prescribed, with individualized exercise RX and with personalized nutrition plan. Value goals: LDL < 70mg , HDL > 40 mg.           Education:Diabetes - Individual verbal and written instruction to review signs/symptoms of diabetes, desired ranges of glucose level fasting, after meals and with exercise. Acknowledge that pre and post exercise glucose checks will be done for 3 sessions at entry of program.   Know Your Numbers and Heart Failure: - Group verbal and visual instruction to discuss disease risk factors for cardiac and pulmonary disease and treatment options.  Reviews associated critical values for Overweight/Obesity, Hypertension, Cholesterol, and Diabetes.  Discusses basics of heart failure: signs/symptoms and treatments.  Introduces Heart Failure Zone chart for action plan for heart failure.  Written material given at graduation.   Core Components/Risk Factors/Patient Goals Review:    Core Components/Risk Factors/Patient Goals at Discharge (Final Review):    ITP Comments:  ITP Comments    Row Name  10/15/20 1303 10/18/20 1611 10/20/20 0704 10/25/20 1128 11/02/20 1022   ITP Comments Initial telephone encounter completed. Diagnosis can be found in [facility] 10/7. EP orientation scheduled for 11/1 2:30pm Completed 6MWT and gym orientation. Initial ITP created and sent for review to Dr. Emily Filbert, Medical Director. 30 Day review completed. Medical Director ITP review done, changes made as directed, and signed approval by Medical Director. Birdie called to let us know that he will be out this week with a gout flare up. First full day of exercise!  Patient was oriented to gym and equipment including functions, settings, policies, and procedures.  Patient's individual exercise prescription and treatment plan were reviewed.  All starting workloads were established based on the results of the 6 minute walk test done at initial orientation visit.  The plan for exercise progression was also introduced and progression will be customized based on patient's performance and goals.   Row Name 11/17/20 0905 11/29/20 0918 12/01/20 1502 12/15/20 0518     ITP Comments 30 Day review completed. Medical Director ITP review done, changes made as directed, and signed approval by Medical Director. Romey called [name] to let us know that he will be out due to breathing issues and has made a pulmonologist appointment tomorrow morning. He does not believe he will be back to rehab before the new year. We will see what his pulmonologist says about returning, however, if he won't be back before the new year we will have to give away his spot and keep him on a medical hold. Called Santana to check in regarding his pulmonary appointment. Vidit doesn't feel he will be back to rehab before the new year due to his breathing issues. He has a chest  CT scheduled 01/20/21 to investigate further. He will let us know what is going on and when he will return. We will put him on a medical hold at this time and keep his spot in the program. We will have to give up his  time slot at this time. 30 Day review completed. Medical Director ITP review done, changes made as directed, and signed approval by Medical Director.  Remains out for medical reasons           Comments:

## 2020-12-18 DIAGNOSIS — J449 Chronic obstructive pulmonary disease, unspecified: Secondary | ICD-10-CM | POA: Diagnosis not present

## 2020-12-20 ENCOUNTER — Telehealth: Payer: Self-pay

## 2020-12-20 DIAGNOSIS — I5022 Chronic systolic (congestive) heart failure: Secondary | ICD-10-CM

## 2020-12-20 MED ORDER — TORSEMIDE 20 MG PO TABS
20.0000 mg | ORAL_TABLET | Freq: Every day | ORAL | 1 refills | Status: DC
Start: 2020-12-20 — End: 2021-01-13

## 2020-12-20 NOTE — Telephone Encounter (Signed)
Copied from [place] [id number]. Topic: General - Inquiry >> Dec 20, 2020  3:16 PM Greggory Keen D wrote: Reason for CRM: Pt called saying he increased the lasic but he is still having some swelling . He also has some gout going on and would like a refill on the gout medication.  CB#  [phone]  Pharmacy:  [facility]

## 2020-12-20 NOTE — Telephone Encounter (Signed)
Please review. Thanks!  

## 2020-12-20 NOTE — Telephone Encounter (Signed)
Recommend he CHANGE furosemide to torsemide. He can start by taking once a day. Have sent prescription to [facility] .

## 2020-12-21 NOTE — Telephone Encounter (Signed)
Patient advised and verbalized understanding 

## 2021-01-06 DIAGNOSIS — J449 Chronic obstructive pulmonary disease, unspecified: Secondary | ICD-10-CM | POA: Diagnosis not present

## 2021-01-12 ENCOUNTER — Encounter: Payer: Self-pay | Admitting: *Deleted

## 2021-01-12 DIAGNOSIS — J449 Chronic obstructive pulmonary disease, unspecified: Secondary | ICD-10-CM

## 2021-01-12 NOTE — Progress Notes (Signed)
Pulmonary Individual Treatment Plan  Patient Details  Name: James [name] MRN: [id number] Date of Birth: Jul 29, 1929 Referring Provider:   Flowsheet Row Pulmonary Rehab from 10/18/2020 in [facility] Cardiac and Pulmonary Rehab  Referring Provider Lanney Gins      Initial Encounter Date:  Flowsheet Row Pulmonary Rehab from 10/18/2020 in [facility] Cardiac and Pulmonary Rehab  Date 10/18/20      Visit Diagnosis: Chronic obstructive pulmonary disease, unspecified COPD type (James [name])  Patient's Home Medications on Admission:  Current Outpatient Medications:  .  albuterol (VENTOLIN HFA) 108 (90 Base) MCG/ACT inhaler, Inhale 2 puffs into the lungs every 6 (six) hours as needed., Disp: , Rfl:  .  atorvastatin (LIPITOR) 80 MG tablet, TAKE 1 TABLET BY MOUTH DAILY AT 6PM, Disp: 90 tablet, Rfl: 3 .  azelastine (OPTIVAR) 0.05 % ophthalmic solution, APPLY 1 DROP INTO THE EYE TWICE DAILY ASDIRECTED, Disp: 6 mL, Rfl: 5 .  beta carotene w/minerals (OCUVITE) tablet, Take 1 tablet by mouth daily., Disp: , Rfl:  .  budesonide (PULMICORT) 0.5 MG/2ML nebulizer solution, Take 2 mLs (0.5 mg total) by nebulization 2 (two) times daily. Dx:j44.9, Disp: 75 mL, Rfl: 12 .  chlorpheniramine-HYDROcodone (TUSSIONEX) 10-8 MG/5ML SUER, TAKE ONE TEASPOONFUL (5ML) EVERY TWELVE HOURS IF NEEDED FOR COUGH, Disp: 115 mL, Rfl: 0 .  ELIQUIS 5 MG TABS tablet, TAKE 1 TABLET BY MOUTH TWICE DAILY, Disp: 180 tablet, Rfl: 4 .  fluticasone (FLONASE) 50 MCG/ACT nasal spray, Place 2 sprays into both nostrils 2 (two) times daily., Disp: 48 g, Rfl: 3 .  ipratropium-albuterol (DUONEB) 0.5-2.5 (3) MG/3ML SOLN, Take 3 mLs by nebulization 4 (four) times daily. , Disp: , Rfl:  .  lansoprazole (PREVACID) 30 MG capsule, TAKE 1 CAPSULE BY MOUTH EVERY DAY, Disp: 90 capsule, Rfl: 3 .  LORazepam (ATIVAN) 1 MG tablet, TAKE 1 TABLET BY MOUTH AT BEDTIME, Disp: 30 tablet, Rfl: 5 .  Magnesium 400 MG CAPS, Take 1 capsule by mouth daily. , Disp: , Rfl:  .  metolazone  (ZAROXOLYN) 5 MG tablet, Take 5 mg by mouth daily., Disp: , Rfl:  .  metoprolol tartrate (LOPRESSOR) 50 MG tablet, TAKE ONE TABLET TWICE DAILY, Disp: 60 tablet, Rfl: 0 .  montelukast (SINGULAIR) 10 MG tablet, TAKE 1 TABLET BY MOUTH AT BEDTIME, Disp: 90 tablet, Rfl: 0 .  MULTIPLE VITAMIN PO, Take 1 tablet by mouth daily. , Disp: , Rfl:  .  Omega-3 Fatty Acids (FISH OIL) 1000 MG CAPS, Take 1 capsule daily by mouth., Disp: , Rfl:  .  tamsulosin (FLOMAX) 0.4 MG CAPS capsule, Take 0.4 mg by mouth daily., Disp: , Rfl:  .  torsemide (DEMADEX) 20 MG tablet, Take 1 tablet (20 mg total) by mouth daily. Take in PLACE of furosemide, Disp: 30 tablet, Rfl: 1  Past Medical History: Past Medical History:  Diagnosis Date  . Arthritis   . Back pain   . CAD (coronary artery disease)   . CAP (community acquired pneumonia) 11/06/2017  . Cataract    right  . COPD (chronic obstructive pulmonary disease) ([place])    SPiriva and SYmbicort daily. Albuterol as needed  . Diverticulosis   . Dyspnea    with exertion  . Enlarged prostate    takes Flomax daily  . GERD (gastroesophageal reflux disease)   . History of chicken pox   . History of gout   . History of measles   . History of mumps   . HOH (hard of hearing)   . Hyperglycemia   .  Hyperlipidemia    takes Atorvastatin daily  . Hypertension    takes Metoprolol daily as well as Lotensin HCT  . Hypotension   . Joint pain   . Microscopic colitis   . PAF (paroxysmal atrial fibrillation) ([place]), RVR 03/14/2017  . Pneumonia   . Prostate cancer ([place])   . Stroke [facility])    TIA  . TIA (transient ischemic attack)   . Weakness    numbness and tingling.mainly on right    Tobacco Use: Social History   Tobacco Use  Smoking Status Former Smoker  . Packs/day: 1.00  . Years: 11.00  . Pack years: 11.00  . Types: Cigarettes  . Quit date: 02/12/1984  . Years since quitting: 36.9  Smokeless Tobacco Never Used  Tobacco Comment   quit smoking 30 yrs ago     Labs: Recent Review Flowsheet Data    Labs for ITP Cardiac and Pulmonary Rehab Latest Ref Rng & Units 10/05/2014 10/07/2014 10/31/2017 12/03/2018 07/13/2019   Cholestrol 0 - 200 mg/dL - 104 106 99 -   LDLCALC 0 - 99 mg/dL - 40 45 34 -   HDL >40 mg/dL - 52 52 55 -   Trlycerides <150 mg/dL - 61 47 51 -   Hemoglobin A1c 4.8 - 5.6 % 5.7 5.7 6.1(H) - 6.1(H)   HCO3 20.0 - 28.0 mmol/L - - - - 30.1(H)   O2SAT % - - - - 75.8       Pulmonary Assessment Scores:  Pulmonary Assessment Scores    Row Name 10/18/20 1600         ADL UCSD   SOB Score total 59     Rest 1     Walk 3     Stairs 4     Bath 2     Dress 3     Shop 2           CAT Score   CAT Score 19           mMRC Score   mMRC Score 1            UCSD: Self-administered rating of dyspnea associated with activities of daily living (ADLs) 6-point scale (0 = "not at all" to 5 = "maximal or unable to do because of breathlessness")  Scoring Scores range from 0 to 120.  Minimally important difference is 5 units  CAT: CAT can identify the health impairment of COPD patients and is better correlated with disease progression.  CAT has a scoring range of zero to 40. The CAT score is classified into four groups of low (less than 10), medium (10 - 20), high (21-30) and very high (31-40) based on the impact level of disease on health status. A CAT score over 10 suggests significant symptoms.  A worsening CAT score could be explained by an exacerbation, poor medication adherence, poor inhaler technique, or progression of COPD or comorbid conditions.  CAT MCID is 2 points  mMRC: mMRC (Modified Medical Research Council) Dyspnea Scale is used to assess the degree of baseline functional disability in patients of respiratory disease due to dyspnea. No minimal important difference is established. A decrease in score of 1 point or greater is considered a positive change.   Pulmonary Function Assessment:   Exercise Target  Goals: Exercise Program Goal: Individual exercise prescription set using results from initial 6 min walk test and THRR while considering  patient's activity barriers and safety.   Exercise Prescription Goal: Initial exercise prescription builds to  30-45 minutes a day of aerobic activity, 2-3 days per week.  Home exercise guidelines will be given to patient during program as part of exercise prescription that the participant will acknowledge.  Education: Aerobic Exercise: - Group verbal and visual presentation on the components of exercise prescription. Introduces F.I.T.T principle from ACSM for exercise prescriptions.  Reviews F.I.T.T. principles of aerobic exercise including progression. Written material given at graduation. Flowsheet Row Pulmonary Rehab from 02/21/2017 in [facility] Cardiac and Pulmonary Rehab  Date 01/17/17  Educator [facility]  Instruction Review Code (retired) 2- meets goals/outcomes      Education: Resistance Exercise: - Group verbal and visual presentation on the components of exercise prescription. Introduces F.I.T.T principle from ACSM for exercise prescriptions  Reviews F.I.T.T. principles of resistance exercise including progression. Written material given at graduation.    Education: Exercise & Equipment Safety: - Individual verbal instruction and demonstration of equipment use and safety with use of the equipment. Flowsheet Row Pulmonary Rehab from 11/04/2020 in [facility] Cardiac and Pulmonary Rehab  Date 10/18/20  Educator AS  Instruction Review Code 1- Verbalizes Understanding      Education: Exercise Physiology & General Exercise Guidelines: - Group verbal and written instruction with models to review the exercise physiology of the cardiovascular system and associated critical values. Provides general exercise guidelines with specific guidelines to those with heart or lung disease.  Flowsheet Row Pulmonary Rehab from 02/21/2017 in [facility] Cardiac and Pulmonary Rehab  Date  12/20/16  Educator [facility]  Instruction Review Code (retired) 2- meets goals/outcomes      Education: Flexibility, Balance, Mind/Body Relaxation: - Group verbal and visual presentation with interactive activity on the components of exercise prescription. Introduces F.I.T.T principle from ACSM for exercise prescriptions. Reviews F.I.T.T. principles of flexibility and balance exercise training including progression. Also discusses the mind body connection.  Reviews various relaxation techniques to help reduce and manage stress (i.e. Deep breathing, progressive muscle relaxation, and visualization). Balance handout provided to take home. Written material given at graduation. Flowsheet Row Pulmonary Rehab from 11/01/2018 in [facility] Cardiac and Pulmonary Rehab  Date 10/09/18  Educator AS  Instruction Review Code 1- Verbalizes Understanding      Activity Barriers & Risk Stratification:  Activity Barriers & Cardiac Risk Stratification - 10/15/20 1305      Activity Barriers & Cardiac Risk Stratification   Activity Barriers Back Problems;Arthritis;Shortness of Breath           6 Minute Walk:  6 Minute Walk    Row Name 10/18/20 1546         6 Minute Walk   Phase Initial     Distance 495 feet     Walk Time 5.3 minutes     # of Rest Breaks 1     MPH 1.06     METS 1.77     RPE 19     Perceived Dyspnea  3     VO2 Peak 0.6     Symptoms Yes (comment)     Comments shortness of breath     Resting HR 98 bpm     Resting BP 108/52     Resting Oxygen Saturation  95 %     Exercise Oxygen Saturation  during 6 min walk 89 %     Max Ex. HR 110 bpm     Max Ex. BP 116/54     2 Minute Post BP 104/56           Interval HR   1 Minute HR 98  2 Minute HR 104     3 Minute HR 108     4 Minute HR 108     5 Minute HR 109     6 Minute HR 110     2 Minute Post HR 99     Interval Heart Rate? Yes           Interval Oxygen   Interval Oxygen? Yes     Baseline Oxygen Saturation % 95 %     1 Minute  Oxygen Saturation % 93 %     1 Minute Liters of Oxygen 0 L     2 Minute Oxygen Saturation % 90 %     2 Minute Liters of Oxygen 0 L     3 Minute Oxygen Saturation % 90 %     3 Minute Liters of Oxygen 0 L     4 Minute Oxygen Saturation % 93 %     4 Minute Liters of Oxygen 0 L     5 Minute Oxygen Saturation % 93 %     5 Minute Liters of Oxygen 0 L     6 Minute Oxygen Saturation % 91 %     6 Minute Liters of Oxygen 0 L     2 Minute Post Oxygen Saturation % 95 %     2 Minute Post Liters of Oxygen 0 L           Oxygen Initial Assessment:  Oxygen Initial Assessment - 11/02/20 1023      Home Oxygen   Home Oxygen Device Home Concentrator    Sleep Oxygen Prescription Continuous    Liters per minute 2    Home Exercise Oxygen Prescription None    Home Resting Oxygen Prescription None    Compliance with Home Oxygen Use Yes      Intervention   Short Term Goals To learn and demonstrate proper pursed lip breathing techniques or other breathing techniques.    Long  Term Goals --           Oxygen Re-Evaluation:  Oxygen Re-Evaluation    [place] Name 11/02/20 1025             Program Oxygen Prescription   Program Oxygen Prescription None               Home Oxygen   Sleep Oxygen Prescription Continuous       Liters per minute 2       Home Exercise Oxygen Prescription None       Home Resting Oxygen Prescription None       Compliance with Home Oxygen Use Yes               Goals/Expected Outcomes   Short Term Goals To learn and demonstrate proper pursed lip breathing techniques or other breathing techniques.       Long  Term Goals Exhibits proper breathing techniques, such as pursed lip breathing or other method taught during program session       Comments Reviewed PLB technique with pt.  Talked about how it works and it's importance in maintaining their exercise saturations.       Goals/Expected Outcomes Short: Become more profiecient at using PLB.   Long: Become independent at  using PLB.              Oxygen Discharge (Final Oxygen Re-Evaluation):  Oxygen Re-Evaluation - 11/02/20 1025      Program Oxygen Prescription   Program Oxygen Prescription None  Home Oxygen   Sleep Oxygen Prescription Continuous    Liters per minute 2    Home Exercise Oxygen Prescription None    Home Resting Oxygen Prescription None    Compliance with Home Oxygen Use Yes      Goals/Expected Outcomes   Short Term Goals To learn and demonstrate proper pursed lip breathing techniques or other breathing techniques.    Long  Term Goals Exhibits proper breathing techniques, such as pursed lip breathing or other method taught during program session    Comments Reviewed PLB technique with pt.  Talked about how it works and it's importance in maintaining their exercise saturations.    Goals/Expected Outcomes Short: Become more profiecient at using PLB.   Long: Become independent at using PLB.           Initial Exercise Prescription:  Initial Exercise Prescription - 10/18/20 1500      Date of Initial Exercise RX and Referring Provider   Date 10/18/20    Referring Provider James [name]      Treadmill   MPH 1    Grade 0    Minutes 15    METs 1.77      Recumbant Bike   Level 1    RPM 60    Minutes 15    METs 1.5      NuStep   Level 1    SPM 80    Minutes 15    METs 1.5      Arm Ergometer   Level 1    RPM 30    Minutes 15    METs 1.5      T5 Nustep   Level 1    SPM 80    Minutes 15    METs 1.5      Prescription Details   Frequency (times per week) 3    Duration Progress to 30 minutes of continuous aerobic without signs/symptoms of physical distress      Intensity   THRR 40-80% of Max Heartrate 111-124    Ratings of Perceived Exertion 11-13    Perceived Dyspnea 0-4      Progression   Progression Continue to progress workloads to maintain intensity without signs/symptoms of physical distress.      Resistance Training   Training Prescription Yes     Weight 3 lb    Reps 10-15           Perform Capillary Blood Glucose checks as needed.  Exercise Prescription Changes:  Exercise Prescription Changes    Row Name 10/18/20 1500 11/17/20 1200 11/30/20 0900         Response to Exercise   Blood Pressure (Admit) 108/52 112/66 106/56     Blood Pressure (Exercise) 116/54 110/60 122/62     Blood Pressure (Exit) 104/56 114/64 108/60     Heart Rate (Admit) 98 bpm 95 bpm 77 bpm     Heart Rate (Exercise) 110 bpm 104 bpm 113 bpm     Heart Rate (Exit) 99 bpm 89 bpm 68 bpm     Oxygen Saturation (Admit) 95 % 89 % 91 %     Oxygen Saturation (Exercise) 89 % 90 % 94 %     Oxygen Saturation (Exit) 95 % 93 % 96 %     Rating of Perceived Exertion (Exercise) 19 12 12      Perceived Dyspnea (Exercise) 3 2 3      Symptoms SOB SOB SOB     Comments - 3rd exercise day -  Duration - Progress to 30 minutes of  aerobic without signs/symptoms of physical distress Progress to 30 minutes of  aerobic without signs/symptoms of physical distress     Intensity - THRR unchanged THRR unchanged           Progression   Progression - Continue to progress workloads to maintain intensity without signs/symptoms of physical distress. Continue to progress workloads to maintain intensity without signs/symptoms of physical distress.     Average METs - 2.55 1.77           Resistance Training   Training Prescription - Yes Yes     Weight -  3lb 3 lb     Reps - 10-15 10-15           Interval Training   Interval Training - No No           Treadmill   MPH - - 0.9     Grade - - 0     Minutes - - 15     METs - - 1.77           Recumbant Bike   Level - 1 -     RPM - 60 -     Minutes - 15 -     METs - 2.97 -           NuStep   Level - 1 -     SPM - 80 -     Minutes - 15 -     METs - 2.1 -            Exercise Comments:  Exercise Comments    Row Name 11/02/20 1022           Exercise Comments First full day of exercise!  Patient was oriented to gym  and equipment including functions, settings, policies, and procedures.  Patient's individual exercise prescription and treatment plan were reviewed.  All starting workloads were established based on the results of the 6 minute walk test done at initial orientation visit.  The plan for exercise progression was also introduced and progression will be customized based on patient's performance and goals.              Exercise Goals and Review:  Exercise Goals    Row Name 10/18/20 1558             Exercise Goals   Increase Physical Activity Yes       Intervention Provide advice, education, support and counseling about physical activity/exercise needs.;Develop an individualized exercise prescription for aerobic and resistive training based on initial evaluation findings, risk stratification, comorbidities and participant's personal goals.       Expected Outcomes Short Term: Attend rehab on a regular basis to increase amount of physical activity.;Long Term: Add in home exercise to make exercise part of routine and to increase amount of physical activity.;Long Term: Exercising regularly at least 3-5 days a week.       Increase Strength and Stamina Yes       Intervention Provide advice, education, support and counseling about physical activity/exercise needs.;Develop an individualized exercise prescription for aerobic and resistive training based on initial evaluation findings, risk stratification, comorbidities and participant's personal goals.       Expected Outcomes Short Term: Increase workloads from initial exercise prescription for resistance, speed, and METs.;Short Term: Perform resistance training exercises routinely during rehab and add in resistance training at home;Long Term: Improve cardiorespiratory fitness, muscular endurance and strength as measured by  increased METs and functional capacity (6MWT)       Able to understand and use rate of perceived exertion (RPE) scale Yes        Intervention Provide education and explanation on how to use RPE scale       Expected Outcomes Short Term: Able to use RPE daily in rehab to express subjective intensity level;Long Term:  Able to use RPE to guide intensity level when exercising independently       Able to understand and use Dyspnea scale Yes       Intervention Provide education and explanation on how to use Dyspnea scale       Expected Outcomes Short Term: Able to use Dyspnea scale daily in rehab to express subjective sense of shortness of breath during exertion;Long Term: Able to use Dyspnea scale to guide intensity level when exercising independently       Knowledge and understanding of Target Heart Rate Range (THRR) Yes       Intervention Provide education and explanation of THRR including how the numbers were predicted and where they are located for reference       Expected Outcomes Short Term: Able to state/look up THRR;Short Term: Able to use daily as guideline for intensity in rehab;Long Term: Able to use THRR to govern intensity when exercising independently       Able to check pulse independently Yes       Intervention Review the importance of being able to check your own pulse for safety during independent exercise       Expected Outcomes Short Term: Able to explain why pulse checking is important during independent exercise;Long Term: Able to check pulse independently and accurately       Understanding of Exercise Prescription Yes       Intervention Provide education, explanation, and written materials on patient's individual exercise prescription       Expected Outcomes Short Term: Able to explain program exercise prescription;Long Term: Able to explain home exercise prescription to exercise independently              Exercise Goals Re-Evaluation :  Exercise Goals Re-Evaluation    Row Name 11/02/20 1022 11/17/20 1232 11/30/20 0906         Exercise Goal Re-Evaluation   Exercise Goals Review Able to understand and  use rate of perceived exertion (RPE) scale;Able to understand and use Dyspnea scale;Knowledge and understanding of Target Heart Rate Range (THRR);Understanding of Exercise Prescription Increase Physical Activity;Increase Strength and Stamina Increase Physical Activity;Increase Strength and Stamina     Comments Reviewed RPE and dyspnea scales, THR and program prescription with pt today.  Pt voiced understanding and was given a copy of goals to take home. James [name] has done well so far with exercise.  Lowest oxygen has been 89%.  Staff will monitor progress. James [name] has had more SOB recently and is seeing his Dr.  Aldean Ast will follow up when he returns     Expected Outcomes Short: Use RPE daily to regulate intensity. Long: Follow program prescription in THR. Short:  attend consistently Long: increase overall stamina Short: check in with Dr about increased SOB Long: get back to [facility]            Discharge Exercise Prescription (Final Exercise Prescription Changes):  Exercise Prescription Changes - 11/30/20 0900      Response to Exercise   Blood Pressure (Admit) 106/56    Blood Pressure (Exercise) 122/62    Blood Pressure (Exit) 108/60  Heart Rate (Admit) 77 bpm    Heart Rate (Exercise) 113 bpm    Heart Rate (Exit) 68 bpm    Oxygen Saturation (Admit) 91 %    Oxygen Saturation (Exercise) 94 %    Oxygen Saturation (Exit) 96 %    Rating of Perceived Exertion (Exercise) 12    Perceived Dyspnea (Exercise) 3    Symptoms SOB    Duration Progress to 30 minutes of  aerobic without signs/symptoms of physical distress    Intensity THRR unchanged      Progression   Progression Continue to progress workloads to maintain intensity without signs/symptoms of physical distress.    Average METs 1.77      Resistance Training   Training Prescription Yes    Weight 3 lb    Reps 10-15      Interval Training   Interval Training No      Treadmill   MPH 0.9    Grade 0    Minutes 15    METs 1.77            Nutrition:  Target Goals: Understanding of nutrition guidelines, daily intake of sodium 1500mg , cholesterol 200mg , calories 30% from fat and 7% or less from saturated fats, daily to have 5 or more servings of fruits and vegetables.  Education: All About Nutrition: -Group instruction provided by verbal, written material, interactive activities, discussions, models, and posters to present general guidelines for heart healthy nutrition including fat, fiber, MyPlate, the role of sodium in heart healthy nutrition, utilization of the nutrition label, and utilization of this knowledge for meal planning. Follow up email sent as well. Written material given at graduation. Flowsheet Row Pulmonary Rehab from 11/01/2018 in [facility] Cardiac and Pulmonary Rehab  Date 10/23/18  Educator [facility]  Instruction Review Code 1- Verbalizes Understanding      Biometrics:  Pre Biometrics - 10/18/20 1559      Pre Biometrics   Height 5' 3.5" (1.613 m)    Weight 177 lb 8 oz (80.5 kg)    BMI (Calculated) 30.95    Single Leg Stand 2.31 seconds            Nutrition Therapy Plan and Nutrition Goals:   Nutrition Assessments:  Nutrition Assessments - 10/18/20 1606      MEDFICTS Scores   Pre Score 16          MEDIFICTS Score Key:  ?70 Need to make dietary changes   40-70 Heart Healthy Diet  ? 40 Therapeutic Level Cholesterol Diet   Picture Your Plate Scores:  <29 Unhealthy dietary pattern with much room for improvement.  41-50 Dietary pattern unlikely to meet recommendations for good health and room for improvement.  51-60 More healthful dietary pattern, with some room for improvement.   >60 Healthy dietary pattern, although there may be some specific behaviors that could be improved.   Nutrition Goals Re-Evaluation:   Nutrition Goals Discharge (Final Nutrition Goals Re-Evaluation):   Psychosocial: Target Goals: Acknowledge presence or absence of significant depression and/or stress,  maximize coping skills, provide positive support system. Participant is able to verbalize types and ability to use techniques and skills needed for reducing stress and depression.   Education: Stress, Anxiety, and Depression - Group verbal and visual presentation to define topics covered.  Reviews how body is impacted by stress, anxiety, and depression.  Also discusses healthy ways to reduce stress and to treat/manage anxiety and depression.  Written material given at graduation. Flowsheet Row Pulmonary Rehab from 11/01/2018 in  [place] Cardiac and Pulmonary Rehab  Date 10/02/18  Educator [facility]  Instruction Review Code 1- [employer] Understanding      Education: Sleep Hygiene -Provides group verbal and written instruction about how sleep can affect your health.  Define sleep hygiene, discuss sleep cycles and impact of sleep habits. Review good sleep hygiene tips.    Initial Review & Psychosocial Screening:  Initial Psych Review & Screening - 10/15/20 1307      Initial Review   Current issues with Current Sleep Concerns      Family Dynamics   Good Support System? Yes   wife     Barriers   Psychosocial barriers to participate in program There are no identifiable barriers or psychosocial needs.;The patient should benefit from training in stress management and relaxation.      Screening Interventions   Interventions Encouraged to exercise;To provide support and resources with identified psychosocial needs;Provide feedback about the scores to participant    Expected Outcomes Short Term goal: Utilizing psychosocial counselor, staff and physician to assist with identification of specific Stressors or current issues interfering with healing process. Setting desired goal for each stressor or current issue identified.;Long Term Goal: Stressors or current issues are controlled or eliminated.;Short Term goal: Identification and review with participant of any Quality of Life or Depression concerns found by  scoring the questionnaire.;Long Term goal: The participant improves quality of Life and PHQ9 Scores as seen by post scores and/or verbalization of changes           Quality of Life Scores:  Scores of 19 and below usually indicate a poorer quality of life in these areas.  A difference of  2-3 points is a clinically meaningful difference.  A difference of 2-3 points in the total score of the Quality of Life Index has been associated with significant improvement in overall quality of life, self-image, physical symptoms, and general health in studies assessing change in quality of life.  PHQ-9: Recent Review Flowsheet Data    Depression screen [facility] 2/9 12/14/2020 10/18/2020 07/12/2020 06/14/2020 09/24/2018   Decreased Interest 0 2  0 0 0   Down, Depressed, Hopeless 0 0 0 0 0   PHQ - 2 Score 0 2 0 0 0   Altered sleeping 2 2  - - 0   Tired, decreased energy 2 2  - - 1   Change in appetite 0 1 - - 2   Feeling bad or failure about yourself  1 0 - - 0   Trouble concentrating 0 1 - - 0   Moving slowly or fidgety/restless 2 0 - - 0   Suicidal thoughts 0 0 - - 0   PHQ-9 Score 7 8 - - 3   Difficult doing work/chores Very difficult Somewhat difficult - - Not difficult at all     Interpretation of Total Score  Total Score Depression Severity:  1-4 = Minimal depression, 5-9 = Mild depression, 10-14 = Moderate depression, 15-19 = Moderately severe depression, 20-27 = Severe depression   Psychosocial Evaluation and Intervention:  Psychosocial Evaluation - 10/15/20 1319      Psychosocial Evaluation & Interventions   Interventions Encouraged to exercise with the program and follow exercise prescription    Comments Janari is a returning patient who hopes to improve his breathing with exercise while here again. His wife is a huge support. This last year has been difficult with Covid restrictions and he is still receiving treatment for his lung cancer. He states he is overall  doing as well as can be besides  his breathing affecting his sleep some nights.    Expected Outcomes Short: attend pulmonary rehab for educaiton and exercise. Long: maintain positive self care habits.    Continue Psychosocial Services  Follow up required by staff           Psychosocial Re-Evaluation:   Psychosocial Discharge (Final Psychosocial Re-Evaluation):   Education: Education Goals: Education classes will be provided on a weekly basis, covering required topics. Participant will state understanding/return demonstration of topics presented.  Learning Barriers/Preferences:  Learning Barriers/Preferences - 10/15/20 1307      Learning Barriers/Preferences   Learning Barriers Hearing    Learning Preferences None           General Pulmonary Education Topics:  Infection Prevention: - Provides verbal and written material to individual with discussion of infection control including proper hand washing and proper equipment cleaning during exercise session. Flowsheet Row Pulmonary Rehab from 11/04/2020 in [facility] Cardiac and Pulmonary Rehab  Date 10/18/20  Educator AS  Instruction Review Code 1- Verbalizes Understanding      Falls Prevention: - Provides verbal and written material to individual with discussion of falls prevention and safety. Flowsheet Row Pulmonary Rehab from 11/04/2020 in [facility] Cardiac and Pulmonary Rehab  Date 10/18/20  Educator AS  Instruction Review Code 1- Verbalizes Understanding      Chronic Lung Disease Review: - Group verbal instruction with posters, models, PowerPoint presentations and videos,  to review new updates, new respiratory medications, new advancements in procedures and treatments. Providing information on websites and "800" numbers for continued self-education. Includes information about supplement oxygen, available portable oxygen systems, continuous and intermittent flow rates, oxygen safety, concentrators, and Medicare reimbursement for oxygen. Explanation of Pulmonary  Drugs, including class, frequency, complications, importance of spacers, rinsing mouth after steroid MDI's, and proper cleaning methods for nebulizers. Review of basic lung anatomy and physiology related to function, structure, and complications of lung disease. Review of risk factors. Discussion about methods for diagnosing sleep apnea and types of masks and machines for OSA. Includes a review of the use of types of environmental controls: home humidity, furnaces, filters, dust mite/pet prevention, HEPA vacuums. Discussion about weather changes, air quality and the benefits of nasal washing. Instruction on Warning signs, infection symptoms, calling MD promptly, preventive modes, and value of vaccinations. Review of effective airway clearance, coughing and/or vibration techniques. Emphasizing that all should Create an Action Plan. Written material given at graduation. Flowsheet Row Pulmonary Rehab from 02/21/2017 in [name]  Date 01/10/17  Educator LB  Instruction Review Code (retired) 2- meets goals/outcomes      AED/CPR: - Group verbal and written instruction with the use of models to demonstrate the basic use of the AED with the basic ABC's of resuscitation.    Anatomy and Cardiac Procedures: - Group verbal and visual presentation and models provide information about basic cardiac anatomy and function. Reviews the testing methods done to diagnose heart disease and the outcomes of the test results. Describes the treatment choices: Medical Management, Angioplasty, or Coronary Bypass Surgery for treating various heart conditions including Myocardial Infarction, Angina, Valve Disease, and Cardiac Arrhythmias.  Written material given at graduation. Flowsheet Row Pulmonary Rehab from 02/21/2017 in [facility] Cardiac and Pulmonary Rehab  Date 02/02/17  Educator CE  Instruction Review Code (retired) 2- meets goals/outcomes      Medication Safety: - Group verbal and visual instruction  to review commonly prescribed medications for heart and lung disease.  Reviews the medication, class of the drug, and side effects. Includes the steps to properly store meds and maintain the prescription regimen.  Written material given at graduation. Flowsheet Row Pulmonary Rehab from 11/04/2020 in [facility] Cardiac and Pulmonary Rehab  Date 11/04/20  Educator [facility]  Instruction Review Code 1- Verbalizes Understanding      Other: -Provides group and verbal instruction on various topics (see comments)   Knowledge Questionnaire Score:  Knowledge Questionnaire Score - 10/18/20 1605      Knowledge Questionnaire Score   Pre Score 15/18 oxygen            Core Components/Risk Factors/Patient Goals at Admission:  Personal Goals and Risk Factors at Admission - 10/18/20 1606      Core Components/Risk Factors/Patient Goals on Admission    Weight Management Yes;Weight Loss    Intervention Weight Management: Develop a combined nutrition and exercise program designed to reach desired caloric intake, while maintaining appropriate intake of nutrient and fiber, sodium and fats, and appropriate energy expenditure required for the weight goal.;Weight Management: Provide education and appropriate resources to help participant work on and attain dietary goals.;Weight Management/Obesity: Establish reasonable short term and long term weight goals.    Admit Weight 177 lb 8 oz (80.5 kg)    Goal Weight: Short Term 175 lb (79.4 kg)    Goal Weight: Long Term 170 lb (77.1 kg)    Expected Outcomes Short Term: Continue to assess and modify interventions until short term weight is achieved;Long Term: Adherence to nutrition and physical activity/exercise program aimed toward attainment of established weight goal;Weight Loss: Understanding of general recommendations for a balanced deficit meal plan, which promotes 1-2 lb weight loss per week and includes a negative energy balance of [phone] kcal/d;Understanding  recommendations for meals to include 15-35% energy as protein, 25-35% energy from fat, 35-60% energy from carbohydrates, less than 200mg  of dietary cholesterol, 20-35 gm of total fiber daily;Understanding of distribution of calorie intake throughout the day with the consumption of 4-5 meals/snacks    Improve shortness of breath with ADL's Yes    Intervention Provide education, individualized exercise plan and daily activity instruction to help decrease symptoms of SOB with activities of daily living.    Expected Outcomes Short Term: Improve cardiorespiratory fitness to achieve a reduction of symptoms when performing ADLs;Long Term: Be able to perform more ADLs without symptoms or delay the onset of symptoms    Heart Failure Yes    Intervention Provide a combined exercise and nutrition program that is supplemented with education, support and counseling about heart failure. Directed toward relieving symptoms such as shortness of breath, decreased exercise tolerance, and extremity edema.    Expected Outcomes Improve functional capacity of life;Short term: Attendance in program 2-3 days a week with increased exercise capacity. Reported lower sodium intake. Reported increased fruit and vegetable intake. Reports medication compliance.;Short term: Daily weights obtained and reported for increase. Utilizing diuretic protocols set by physician.;Long term: Adoption of self-care skills and reduction of barriers for early signs and symptoms recognition and intervention leading to self-care maintenance.    Hypertension Yes    Intervention Provide education on lifestyle modifcations including regular physical activity/exercise, weight management, moderate sodium restriction and increased consumption of fresh fruit, vegetables, and low fat dairy, alcohol moderation, and smoking cessation.;Monitor prescription use compliance.    Lipids Yes    Intervention Provide education and support for participant on nutrition &  aerobic/resistive exercise along with prescribed medications to achieve LDL 70mg , HDL >40mg .  Expected Outcomes Short Term: Participant states understanding of desired cholesterol values and is compliant with medications prescribed. Participant is following exercise prescription and nutrition guidelines.;Long Term: Cholesterol controlled with medications as prescribed, with individualized exercise RX and with personalized nutrition plan. Value goals: LDL < 70mg , HDL > 40 mg.           Education:Diabetes - Individual verbal and written instruction to review signs/symptoms of diabetes, desired ranges of glucose level fasting, after meals and with exercise. Acknowledge that pre and post exercise glucose checks will be done for 3 sessions at entry of program.   Know Your Numbers and Heart Failure: - Group verbal and visual instruction to discuss disease risk factors for cardiac and pulmonary disease and treatment options.  Reviews associated critical values for Overweight/Obesity, Hypertension, Cholesterol, and Diabetes.  Discusses basics of heart failure: signs/symptoms and treatments.  Introduces Heart Failure Zone chart for action plan for heart failure.  Written material given at graduation.   Core Components/Risk Factors/Patient Goals Review:    Core Components/Risk Factors/Patient Goals at Discharge (Final Review):    ITP Comments:  ITP Comments    Row Name 10/15/20 1303 10/18/20 1611 10/20/20 0704 10/25/20 1128 11/02/20 1022   ITP Comments Initial telephone encounter completed. Diagnosis can be found in [facility] 10/7. EP orientation scheduled for 11/1 2:30pm Completed 6MWT and gym orientation. Initial ITP created and sent for review to Dr. Emily Filbert, Medical Director. 30 Day review completed. Medical Director ITP review done, changes made as directed, and signed approval by Medical Director. [name] called to let us know that he will be out this week with a gout flare up. First full day of  exercise!  Patient was oriented to gym and equipment including functions, settings, policies, and procedures.  Patient's individual exercise prescription and treatment plan were reviewed.  All starting workloads were established based on the results of the 6 minute walk test done at initial orientation visit.  The plan for exercise progression was also introduced and progression will be customized based on patient's performance and goals.   [name] Name 11/17/20 0905 11/29/20 0918 12/01/20 1502 12/15/20 0518 01/12/21 0841   ITP Comments 30 Day review completed. Medical Director ITP review done, changes made as directed, and signed approval by Medical Director. [name] called [name] to let us know that he will be out due to breathing issues and has made a pulmonologist appointment tomorrow morning. He does not believe he will be back to rehab before the new year. We will see what his pulmonologist says about returning, however, if he won't be back before the new year we will have to give away his spot and keep him on a medical hold. Called [name] to check in regarding his pulmonary appointment. [name] doesn't feel he will be back to rehab before the new year due to his breathing issues. He has a chest CT scheduled 01/20/21 to investigate further. He will let us know what is going on and when he will return. We will put him on a medical hold at this time and keep his spot in the program. We will have to give up his time slot at this time. 30 Day review completed. Medical Director ITP review done, changes made as directed, and signed approval by Medical Director.  Remains out for medical reasons 30 Day review completed. Medical Director ITP review done, changes made as directed, and signed approval by Medical Director.          Comments:

## 2021-01-13 ENCOUNTER — Other Ambulatory Visit: Payer: Self-pay | Admitting: Family Medicine

## 2021-01-13 DIAGNOSIS — I5022 Chronic systolic (congestive) heart failure: Secondary | ICD-10-CM

## 2021-01-18 DIAGNOSIS — J449 Chronic obstructive pulmonary disease, unspecified: Secondary | ICD-10-CM | POA: Diagnosis not present

## 2021-01-20 ENCOUNTER — Other Ambulatory Visit: Payer: Self-pay

## 2021-01-20 ENCOUNTER — Ambulatory Visit
Admission: RE | Admit: 2021-01-20 | Discharge: 2021-01-20 | Disposition: A | Discharge status: Home / Self Care | Payer: Medicare Other | Source: Ambulatory Visit | Attending: Radiation Oncology | Admitting: Radiation Oncology

## 2021-01-20 DIAGNOSIS — J432 Centrilobular emphysema: Secondary | ICD-10-CM | POA: Diagnosis not present

## 2021-01-20 DIAGNOSIS — J9811 Atelectasis: Secondary | ICD-10-CM | POA: Diagnosis not present

## 2021-01-20 DIAGNOSIS — C3431 Malignant neoplasm of lower lobe, right bronchus or lung: Secondary | ICD-10-CM | POA: Insufficient documentation

## 2021-01-20 DIAGNOSIS — C3491 Malignant neoplasm of unspecified part of right bronchus or lung: Secondary | ICD-10-CM | POA: Diagnosis not present

## 2021-01-20 DIAGNOSIS — J90 Pleural effusion, not elsewhere classified: Secondary | ICD-10-CM | POA: Diagnosis not present

## 2021-01-20 LAB — POCT I-STAT CREATININE: Creatinine, Ser: 1.3 mg/dL — ABNORMAL HIGH (ref 0.61–1.24)

## 2021-01-20 MED ORDER — IOHEXOL 300 MG/ML  SOLN
75.0000 mL | Freq: Once | INTRAMUSCULAR | Status: AC | PRN
  Administered 2021-01-20: 75 mL via INTRAVENOUS

## 2021-01-25 DIAGNOSIS — Z85118 Personal history of other malignant neoplasm of bronchus and lung: Secondary | ICD-10-CM | POA: Diagnosis not present

## 2021-01-28 ENCOUNTER — Ambulatory Visit
Admission: RE | Admit: 2021-01-28 | Discharge: 2021-01-28 | Disposition: A | Discharge status: Home / Self Care | Payer: Medicare Other | Source: Ambulatory Visit | Attending: Radiation Oncology | Admitting: Radiation Oncology

## 2021-01-28 ENCOUNTER — Other Ambulatory Visit: Payer: Self-pay | Admitting: Licensed Clinical Social Worker

## 2021-01-28 ENCOUNTER — Encounter: Payer: Self-pay | Admitting: Radiation Oncology

## 2021-01-28 VITALS — BP 137/66 | HR 91 | Wt 178.4 lb

## 2021-01-28 DIAGNOSIS — Z87891 Personal history of nicotine dependence: Secondary | ICD-10-CM | POA: Diagnosis not present

## 2021-01-28 DIAGNOSIS — C3431 Malignant neoplasm of lower lobe, right bronchus or lung: Secondary | ICD-10-CM | POA: Diagnosis not present

## 2021-01-28 DIAGNOSIS — Z08 Encounter for follow-up examination after completed treatment for malignant neoplasm: Secondary | ICD-10-CM | POA: Diagnosis not present

## 2021-01-28 NOTE — Progress Notes (Signed)
Radiation Oncology Follow up Note  Name: [name]   Date:   01/28/2021 MRN:  [id number] DOB: [birth year]    This 87 y.o. 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  REFERRING PROVIDER: Birdie Sons, MD  HPI: Patient is an 87 year old male now out 3 years having completed radiation therapy to his right lower lobe for stage IIb adenocarcinoma seen today in routine follow-up he is doing fairly well.  He continues to have problems with his breathing secondary to his COPD and prior treatment.  He specifically [place] cough hemoptysis or chest tightness..  He had a recent CT scan showing increase in small right pleural effusion right lower lobe aeration secondary likely to compressive atelectasis no specific evidence of any progressive disease or malignancy.  COMPLICATIONS OF TREATMENT: none  FOLLOW UP COMPLIANCE: keeps appointments   PHYSICAL EXAM:  BP 137/66   Pulse 91   Wt 178 lb 6 oz (80.9 kg)   SpO2 95% Comment: room air  BMI 30.62 kg/m  Elderly male in NAD.  Well-developed well-nourished patient in NAD. HEENT reveals PERLA, EOMI, discs not visualized.  Oral cavity is clear. No oral mucosal lesions are identified. Neck is clear without evidence of cervical or supraclavicular adenopathy. Lungs are clear to A&P. Cardiac examination is essentially unremarkable with regular rate and rhythm without murmur rub or thrill. Abdomen is benign with no organomegaly or masses noted. Motor sensory and DTR levels are equal and symmetric in the upper and lower extremities. Cranial nerves II through XII are grossly intact. Proprioception is intact. No peripheral adenopathy or edema is identified. No motor or sensory levels are noted. Crude visual fields are within normal range.  RADIOLOGY RESULTS: CT scan reviewed compatible with above-stated findings  PLAN: The present time patient is  stable with no evidence of active disease.  Based on his advanced age COPD and other factors continues to have problems with his breathing which is his main concern.  I have assured him his CT scan is stable.  I have asked to see him back in 1 year for follow-up with a CT scan at that time.  Patient knows to call with any concerns.  I would like to take this opportunity to thank you for allowing me to participate in the care of your patient.[name], MD

## 2021-02-06 DIAGNOSIS — J449 Chronic obstructive pulmonary disease, unspecified: Secondary | ICD-10-CM | POA: Diagnosis not present

## 2021-02-08 ENCOUNTER — Encounter: Payer: Self-pay | Admitting: *Deleted

## 2021-02-08 DIAGNOSIS — J449 Chronic obstructive pulmonary disease, unspecified: Secondary | ICD-10-CM

## 2021-02-09 ENCOUNTER — Encounter: Payer: Self-pay | Admitting: *Deleted

## 2021-02-09 DIAGNOSIS — J449 Chronic obstructive pulmonary disease, unspecified: Secondary | ICD-10-CM

## 2021-02-09 NOTE — Progress Notes (Signed)
Pulmonary Individual Treatment Plan  Patient Details  Name: RANSOM NICKSON MRN: [id number] Date of Birth: 1929/11/27 Referring Provider:   Flowsheet Row Pulmonary Rehab from 10/18/2020 in [facility] Cardiac and Pulmonary Rehab  Referring Provider Lanney Gins      Initial Encounter Date:  Flowsheet Row Pulmonary Rehab from 10/18/2020 in [facility] Cardiac and Pulmonary Rehab  Date 10/18/20      Visit Diagnosis: Chronic obstructive pulmonary disease, unspecified COPD type ([place])  Patient's Home Medications on Admission:  Current Outpatient Medications:  .  albuterol (VENTOLIN HFA) 108 (90 Base) MCG/ACT inhaler, Inhale 2 puffs into the lungs every 6 (six) hours as needed., Disp: , Rfl:  .  atorvastatin (LIPITOR) 80 MG tablet, TAKE 1 TABLET BY MOUTH DAILY AT 6PM, Disp: 90 tablet, Rfl: 3 .  azelastine (OPTIVAR) 0.05 % ophthalmic solution, APPLY 1 DROP INTO THE EYE TWICE DAILY ASDIRECTED, Disp: 6 mL, Rfl: 5 .  beta carotene w/minerals (OCUVITE) tablet, Take 1 tablet by mouth daily., Disp: , Rfl:  .  budesonide (PULMICORT) 0.5 MG/2ML nebulizer solution, Take 2 mLs (0.5 mg total) by nebulization 2 (two) times daily. Dx:j44.9, Disp: 75 mL, Rfl: 12 .  chlorpheniramine-HYDROcodone (TUSSIONEX) 10-8 MG/5ML SUER, TAKE ONE TEASPOONFUL (5ML) EVERY TWELVE HOURS IF NEEDED FOR COUGH, Disp: 115 mL, Rfl: 0 .  ELIQUIS 5 MG TABS tablet, TAKE 1 TABLET BY MOUTH TWICE DAILY, Disp: 180 tablet, Rfl: 4 .  fluticasone (FLONASE) 50 MCG/ACT nasal spray, Place 2 sprays into both nostrils 2 (two) times daily., Disp: 48 g, Rfl: 3 .  ipratropium-albuterol (DUONEB) 0.5-2.5 (3) MG/3ML SOLN, Take 3 mLs by nebulization 4 (four) times daily. , Disp: , Rfl:  .  lansoprazole (PREVACID) 30 MG capsule, TAKE 1 CAPSULE BY MOUTH EVERY DAY, Disp: 90 capsule, Rfl: 3 .  LORazepam (ATIVAN) 1 MG tablet, TAKE 1 TABLET BY MOUTH AT BEDTIME, Disp: 30 tablet, Rfl: 5 .  Magnesium 400 MG CAPS, Take 1 capsule by mouth daily. , Disp: , Rfl:  .  metolazone  (ZAROXOLYN) 5 MG tablet, Take 5 mg by mouth daily., Disp: , Rfl:  .  metoprolol tartrate (LOPRESSOR) 50 MG tablet, TAKE ONE TABLET TWICE DAILY, Disp: 60 tablet, Rfl: 0 .  montelukast (SINGULAIR) 10 MG tablet, TAKE 1 TABLET BY MOUTH AT BEDTIME, Disp: 90 tablet, Rfl: 0 .  MULTIPLE VITAMIN PO, Take 1 tablet by mouth daily. , Disp: , Rfl:  .  Omega-3 Fatty Acids (FISH OIL) 1000 MG CAPS, Take 1 capsule daily by mouth., Disp: , Rfl:  .  predniSONE (DELTASONE) 5 MG tablet, Take 5 mg by mouth daily., Disp: , Rfl:  .  tamsulosin (FLOMAX) 0.4 MG CAPS capsule, Take 0.4 mg by mouth daily., Disp: , Rfl:  .  torsemide (DEMADEX) 20 MG tablet, TAKE 1 TABLET BY MOUTH DAILY. TAKE IN PLACE OF FUROSEMIDE, Disp: 30 tablet, Rfl: 1  Past Medical History: Past Medical History:  Diagnosis Date  . Arthritis   . Back pain   . CAD (coronary artery disease)   . CAP (community acquired pneumonia) 11/06/2017  . Cataract    right  . COPD (chronic obstructive pulmonary disease) ([place])    SPiriva and SYmbicort daily. Albuterol as needed  . Diverticulosis   . Dyspnea    with exertion  . Enlarged prostate    takes Flomax daily  . GERD (gastroesophageal reflux disease)   . History of chicken pox   . History of gout   . History of measles   . History of  mumps   . HOH (hard of hearing)   . Hyperglycemia   . Hyperlipidemia    takes Atorvastatin daily  . Hypertension    takes Metoprolol daily as well as Lotensin HCT  . Hypotension   . Joint pain   . Microscopic colitis   . PAF (paroxysmal atrial fibrillation) ([place]), RVR 03/14/2017  . Pneumonia   . Prostate cancer ([place])   . Stroke [facility])    TIA  . TIA (transient ischemic attack)   . Weakness    numbness and tingling.mainly on right    Tobacco Use: Social History   Tobacco Use  Smoking Status Former Smoker  . Packs/day: 1.00  . Years: 11.00  . Pack years: 11.00  . Types: Cigarettes  . Quit date: 02/12/1984  . Years since quitting: 37.0  Smokeless  Tobacco Never Used  Tobacco Comment   quit smoking 30 yrs ago    Labs: Recent Review Flowsheet Data    Labs for ITP Cardiac and Pulmonary Rehab Latest Ref Rng & Units 10/05/2014 10/07/2014 10/31/2017 12/03/2018 07/13/2019   Cholestrol 0 - 200 mg/dL - 104 106 99 -   LDLCALC 0 - 99 mg/dL - 40 45 34 -   HDL >40 mg/dL - 52 52 55 -   Trlycerides <150 mg/dL - 61 47 51 -   Hemoglobin A1c 4.8 - 5.6 % 5.7 5.7 6.1(H) - 6.1(H)   HCO3 20.0 - 28.0 mmol/L - - - - 30.1(H)   O2SAT % - - - - 75.8       Pulmonary Assessment Scores:  Pulmonary Assessment Scores    Row Name 10/18/20 1600         ADL UCSD   SOB Score total 59     Rest 1     Walk 3     Stairs 4     Bath 2     Dress 3     Shop 2           CAT Score   CAT Score 19           mMRC Score   mMRC Score 1            UCSD: Self-administered rating of dyspnea associated with activities of daily living (ADLs) 6-point scale (0 = "not at all" to 5 = "maximal or unable to do because of breathlessness")  Scoring Scores range from 0 to 120.  Minimally important difference is 5 units  CAT: CAT can identify the health impairment of COPD patients and is better correlated with disease progression.  CAT has a scoring range of zero to 40. The CAT score is classified into four groups of low (less than 10), medium (10 - 20), high (21-30) and very high (31-40) based on the impact level of disease on health status. A CAT score over 10 suggests significant symptoms.  A worsening CAT score could be explained by an exacerbation, poor medication adherence, poor inhaler technique, or progression of COPD or comorbid conditions.  CAT MCID is 2 points  mMRC: mMRC (Modified Medical Research Council) Dyspnea Scale is used to assess the degree of baseline functional disability in patients of respiratory disease due to dyspnea. No minimal important difference is established. A decrease in score of 1 point or greater is considered a positive change.    Pulmonary Function Assessment:   Exercise Target Goals: Exercise Program Goal: Individual exercise prescription set using results from initial 6 min walk test and THRR while considering  patient's activity barriers and safety.   Exercise Prescription Goal: Initial exercise prescription builds to 30-45 minutes a day of aerobic activity, 2-3 days per week.  Home exercise guidelines will be given to patient during program as part of exercise prescription that the participant will acknowledge.  Education: Aerobic Exercise: - Group verbal and visual presentation on the components of exercise prescription. Introduces F.I.T.T principle from ACSM for exercise prescriptions.  Reviews F.I.T.T. principles of aerobic exercise including progression. Written material given at graduation. Flowsheet Row Pulmonary Rehab from 02/21/2017 in [facility] Cardiac and Pulmonary Rehab  Date 01/17/17  Educator [facility]  Instruction Review Code (retired) 2- meets goals/outcomes      Education: Resistance Exercise: - Group verbal and visual presentation on the components of exercise prescription. Introduces F.I.T.T principle from ACSM for exercise prescriptions  Reviews F.I.T.T. principles of resistance exercise including progression. Written material given at graduation.    Education: Exercise & Equipment Safety: - Individual verbal instruction and demonstration of equipment use and safety with use of the equipment. Flowsheet Row Pulmonary Rehab from 11/04/2020 in [facility] Cardiac and Pulmonary Rehab  Date 10/18/20  Educator AS  Instruction Review Code 1- Verbalizes Understanding      Education: Exercise Physiology & General Exercise Guidelines: - Group verbal and written instruction with models to review the exercise physiology of the cardiovascular system and associated critical values. Provides general exercise guidelines with specific guidelines to those with heart or lung disease.  Flowsheet Row Pulmonary Rehab from  02/21/2017 in [facility] Cardiac and Pulmonary Rehab  Date 12/20/16  Educator [facility]  Instruction Review Code (retired) 2- meets goals/outcomes      Education: Flexibility, Balance, Mind/Body Relaxation: - Group verbal and visual presentation with interactive activity on the components of exercise prescription. Introduces F.I.T.T principle from ACSM for exercise prescriptions. Reviews F.I.T.T. principles of flexibility and balance exercise training including progression. Also discusses the mind body connection.  Reviews various relaxation techniques to help reduce and manage stress (i.e. Deep breathing, progressive muscle relaxation, and visualization). Balance handout provided to take home. Written material given at graduation. Flowsheet Row Pulmonary Rehab from 11/01/2018 in [facility] Cardiac and Pulmonary Rehab  Date 10/09/18  Educator AS  Instruction Review Code 1- Verbalizes Understanding      Activity Barriers & Risk Stratification:  Activity Barriers & Cardiac Risk Stratification - 10/15/20 1305      Activity Barriers & Cardiac Risk Stratification   Activity Barriers Back Problems;Arthritis;Shortness of Breath           6 Minute Walk:  6 Minute Walk    Row Name 10/18/20 1546         6 Minute Walk   Phase Initial     Distance 495 feet     Walk Time 5.3 minutes     # of Rest Breaks 1     MPH 1.06     METS 1.77     RPE 19     Perceived Dyspnea  3     VO2 Peak 0.6     Symptoms Yes (comment)     Comments shortness of breath     Resting HR 98 bpm     Resting BP 108/52     Resting Oxygen Saturation  95 %     Exercise Oxygen Saturation  during 6 min walk 89 %     Max Ex. HR 110 bpm     Max Ex. BP 116/54     2 Minute Post BP 104/56  Interval HR   1 Minute HR 98     2 Minute HR 104     3 Minute HR 108     4 Minute HR 108     5 Minute HR 109     6 Minute HR 110     2 Minute Post HR 99     Interval Heart Rate? Yes           Interval Oxygen   Interval Oxygen? Yes      Baseline Oxygen Saturation % 95 %     1 Minute Oxygen Saturation % 93 %     1 Minute Liters of Oxygen 0 L     2 Minute Oxygen Saturation % 90 %     2 Minute Liters of Oxygen 0 L     3 Minute Oxygen Saturation % 90 %     3 Minute Liters of Oxygen 0 L     4 Minute Oxygen Saturation % 93 %     4 Minute Liters of Oxygen 0 L     5 Minute Oxygen Saturation % 93 %     5 Minute Liters of Oxygen 0 L     6 Minute Oxygen Saturation % 91 %     6 Minute Liters of Oxygen 0 L     2 Minute Post Oxygen Saturation % 95 %     2 Minute Post Liters of Oxygen 0 L           Oxygen Initial Assessment:  Oxygen Initial Assessment - 11/02/20 1023      Home Oxygen   Home Oxygen Device Home Concentrator    Sleep Oxygen Prescription Continuous    Liters per minute 2    Home Exercise Oxygen Prescription None    Home Resting Oxygen Prescription None    Compliance with Home Oxygen Use Yes      Intervention   Short Term Goals To learn and demonstrate proper pursed lip breathing techniques or other breathing techniques.    Long  Term Goals --           Oxygen Re-Evaluation:  Oxygen Re-Evaluation    [place] Name 11/02/20 1025             Program Oxygen Prescription   Program Oxygen Prescription None               Home Oxygen   Sleep Oxygen Prescription Continuous       Liters per minute 2       Home Exercise Oxygen Prescription None       Home Resting Oxygen Prescription None       Compliance with Home Oxygen Use Yes               Goals/Expected Outcomes   Short Term Goals To learn and demonstrate proper pursed lip breathing techniques or other breathing techniques.       Long  Term Goals Exhibits proper breathing techniques, such as pursed lip breathing or other method taught during program session       Comments Reviewed PLB technique with pt.  Talked about how it works and it's importance in maintaining their exercise saturations.       Goals/Expected Outcomes Short: Become more  profiecient at using PLB.   Long: Become independent at using PLB.              Oxygen Discharge (Final Oxygen Re-Evaluation):  Oxygen Re-Evaluation - 11/02/20 1025  Program Oxygen Prescription   Program Oxygen Prescription None      Home Oxygen   Sleep Oxygen Prescription Continuous    Liters per minute 2    Home Exercise Oxygen Prescription None    Home Resting Oxygen Prescription None    Compliance with Home Oxygen Use Yes      Goals/Expected Outcomes   Short Term Goals To learn and demonstrate proper pursed lip breathing techniques or other breathing techniques.    Long  Term Goals Exhibits proper breathing techniques, such as pursed lip breathing or other method taught during program session    Comments Reviewed PLB technique with pt.  Talked about how it works and it's importance in maintaining their exercise saturations.    Goals/Expected Outcomes Short: Become more profiecient at using PLB.   Long: Become independent at using PLB.           Initial Exercise Prescription:  Initial Exercise Prescription - 10/18/20 1500      Date of Initial Exercise RX and Referring Provider   Date 10/18/20    Referring Provider Aleskerov      Treadmill   MPH 1    Grade 0    Minutes 15    METs 1.77      Recumbant Bike   Level 1    RPM 60    Minutes 15    METs 1.5      NuStep   Level 1    SPM 80    Minutes 15    METs 1.5      Arm Ergometer   Level 1    RPM 30    Minutes 15    METs 1.5      T5 Nustep   Level 1    SPM 80    Minutes 15    METs 1.5      Prescription Details   Frequency (times per week) 3    Duration Progress to 30 minutes of continuous aerobic without signs/symptoms of physical distress      Intensity   THRR 40-80% of Max Heartrate 111-124    Ratings of Perceived Exertion 11-13    Perceived Dyspnea 0-4      Progression   Progression Continue to progress workloads to maintain intensity without signs/symptoms of physical distress.       Resistance Training   Training Prescription Yes    Weight 3 lb    Reps 10-15           Perform Capillary Blood Glucose checks as needed.  Exercise Prescription Changes:  Exercise Prescription Changes    Row Name 10/18/20 1500 11/17/20 1200 11/30/20 0900         Response to Exercise   Blood Pressure (Admit) 108/52 112/66 106/56     Blood Pressure (Exercise) 116/54 110/60 122/62     Blood Pressure (Exit) 104/56 114/64 108/60     Heart Rate (Admit) 98 bpm 95 bpm 77 bpm     Heart Rate (Exercise) 110 bpm 104 bpm 113 bpm     Heart Rate (Exit) 99 bpm 89 bpm 68 bpm     Oxygen Saturation (Admit) 95 % 89 % 91 %     Oxygen Saturation (Exercise) 89 % 90 % 94 %     Oxygen Saturation (Exit) 95 % 93 % 96 %     Rating of Perceived Exertion (Exercise) 19 12 12      Perceived Dyspnea (Exercise) 3 2 3      Symptoms SOB  SOB SOB     Comments - 3rd exercise day -     Duration - Progress to 30 minutes of  aerobic without signs/symptoms of physical distress Progress to 30 minutes of  aerobic without signs/symptoms of physical distress     Intensity - THRR unchanged THRR unchanged           Progression   Progression - Continue to progress workloads to maintain intensity without signs/symptoms of physical distress. Continue to progress workloads to maintain intensity without signs/symptoms of physical distress.     Average METs - 2.55 1.77           Resistance Training   Training Prescription - Yes Yes     Weight -  3lb 3 lb     Reps - 10-15 10-15           Interval Training   Interval Training - No No           Treadmill   MPH - - 0.9     Grade - - 0     Minutes - - 15     METs - - 1.77           Recumbant Bike   Level - 1 -     RPM - 60 -     Minutes - 15 -     METs - 2.97 -           NuStep   Level - 1 -     SPM - 80 -     Minutes - 15 -     METs - 2.1 -            Exercise Comments:  Exercise Comments    Row Name 11/02/20 1022           Exercise Comments First  full day of exercise!  Patient was oriented to gym and equipment including functions, settings, policies, and procedures.  Patient's individual exercise prescription and treatment plan were reviewed.  All starting workloads were established based on the results of the 6 minute walk test done at initial orientation visit.  The plan for exercise progression was also introduced and progression will be customized based on patient's performance and goals.              Exercise Goals and Review:  Exercise Goals    Row Name 10/18/20 1558             Exercise Goals   Increase Physical Activity Yes       Intervention Provide advice, education, support and counseling about physical activity/exercise needs.;Develop an individualized exercise prescription for aerobic and resistive training based on initial evaluation findings, risk stratification, comorbidities and participant's personal goals.       Expected Outcomes Short Term: Attend rehab on a regular basis to increase amount of physical activity.;Long Term: Add in home exercise to make exercise part of routine and to increase amount of physical activity.;Long Term: Exercising regularly at least 3-5 days a week.       Increase Strength and Stamina Yes       Intervention Provide advice, education, support and counseling about physical activity/exercise needs.;Develop an individualized exercise prescription for aerobic and resistive training based on initial evaluation findings, risk stratification, comorbidities and participant's personal goals.       Expected Outcomes Short Term: Increase workloads from initial exercise prescription for resistance, speed, and METs.;Short Term: Perform resistance training exercises routinely during rehab and add  in resistance training at home;Long Term: Improve cardiorespiratory fitness, muscular endurance and strength as measured by increased METs and functional capacity (6MWT)       Able to understand and use rate of  perceived exertion (RPE) scale Yes       Intervention Provide education and explanation on how to use RPE scale       Expected Outcomes Short Term: Able to use RPE daily in rehab to express subjective intensity level;Long Term:  Able to use RPE to guide intensity level when exercising independently       Able to understand and use Dyspnea scale Yes       Intervention Provide education and explanation on how to use Dyspnea scale       Expected Outcomes Short Term: Able to use Dyspnea scale daily in rehab to express subjective sense of shortness of breath during exertion;Long Term: Able to use Dyspnea scale to guide intensity level when exercising independently       Knowledge and understanding of Target Heart Rate Range (THRR) Yes       Intervention Provide education and explanation of THRR including how the numbers were predicted and where they are located for reference       Expected Outcomes Short Term: Able to state/look up THRR;Short Term: Able to use daily as guideline for intensity in rehab;Long Term: Able to use THRR to govern intensity when exercising independently       Able to check pulse independently Yes       Intervention Review the importance of being able to check your own pulse for safety during independent exercise       Expected Outcomes Short Term: Able to explain why pulse checking is important during independent exercise;Long Term: Able to check pulse independently and accurately       Understanding of Exercise Prescription Yes       Intervention Provide education, explanation, and written materials on patient's individual exercise prescription       Expected Outcomes Short Term: Able to explain program exercise prescription;Long Term: Able to explain home exercise prescription to exercise independently              Exercise Goals Re-Evaluation :  Exercise Goals Re-Evaluation    Row Name 11/02/20 1022 11/17/20 1232 11/30/20 0906 01/12/21 1319 02/08/21 1606     Exercise  Goal Re-Evaluation   Exercise Goals Review Able to understand and use rate of perceived exertion (RPE) scale;Able to understand and use Dyspnea scale;Knowledge and understanding of Target Heart Rate Range (THRR);Understanding of Exercise Prescription Increase Physical Activity;Increase Strength and Stamina Increase Physical Activity;Increase Strength and Stamina - -   Comments Reviewed RPE and dyspnea scales, THR and program prescription with pt today.  Pt voiced understanding and was given a copy of goals to take home. Bartley has done well so far with exercise.  Lowest oxygen has been 89%.  Staff will monitor progress. Zyheir has had more SOB recently and is seeing his Dr.  Aldean Ast will follow up when he returns Continues to be on medical hold for CT scan next month. Continues to be out   Expected Outcomes Short: Use RPE daily to regulate intensity. Long: Follow program prescription in THR. Short:  attend consistently Long: increase overall stamina Short: check in with Dr about increased SOB Long: get back to [facility] - -          Discharge Exercise Prescription (Final Exercise Prescription Changes):  Exercise Prescription Changes - 11/30/20 0900  Response to Exercise   Blood Pressure (Admit) 106/56    Blood Pressure (Exercise) 122/62    Blood Pressure (Exit) 108/60    Heart Rate (Admit) 77 bpm    Heart Rate (Exercise) 113 bpm    Heart Rate (Exit) 68 bpm    Oxygen Saturation (Admit) 91 %    Oxygen Saturation (Exercise) 94 %    Oxygen Saturation (Exit) 96 %    Rating of Perceived Exertion (Exercise) 12    Perceived Dyspnea (Exercise) 3    Symptoms SOB    Duration Progress to 30 minutes of  aerobic without signs/symptoms of physical distress    Intensity THRR unchanged      Progression   Progression Continue to progress workloads to maintain intensity without signs/symptoms of physical distress.    Average METs 1.77      Resistance Training   Training Prescription Yes    Weight 3 lb    Reps  10-15      Interval Training   Interval Training No      Treadmill   MPH 0.9    Grade 0    Minutes 15    METs 1.77           Nutrition:  Target Goals: Understanding of nutrition guidelines, daily intake of sodium 1500mg , cholesterol 200mg , calories 30% from fat and 7% or less from saturated fats, daily to have 5 or more servings of fruits and vegetables.  Education: All About Nutrition: -Group instruction provided by verbal, written material, interactive activities, discussions, models, and posters to present general guidelines for heart healthy nutrition including fat, fiber, MyPlate, the role of sodium in heart healthy nutrition, utilization of the nutrition label, and utilization of this knowledge for meal planning. Follow up email sent as well. Written material given at graduation. Flowsheet Row Pulmonary Rehab from 11/01/2018 in [facility] Cardiac and Pulmonary Rehab  Date 10/23/18  Educator [facility]  Instruction Review Code 1- Verbalizes Understanding      Biometrics:  Pre Biometrics - 10/18/20 1559      Pre Biometrics   Height 5' 3.5" (1.613 m)    Weight 177 lb 8 oz (80.5 kg)    BMI (Calculated) 30.95    Single Leg Stand 2.31 seconds            Nutrition Therapy Plan and Nutrition Goals:   Nutrition Assessments:  Nutrition Assessments - 10/18/20 1606      MEDFICTS Scores   Pre Score 16          MEDIFICTS Score Key:  ?70 Need to make dietary changes   40-70 Heart Healthy Diet  ? 40 Therapeutic Level Cholesterol Diet   Picture Your Plate Scores:  <76 Unhealthy dietary pattern with much room for improvement.  41-50 Dietary pattern unlikely to meet recommendations for good health and room for improvement.  51-60 More healthful dietary pattern, with some room for improvement.   >60 Healthy dietary pattern, although there may be some specific behaviors that could be improved.   Nutrition Goals Re-Evaluation:   Nutrition Goals Discharge (Final  Nutrition Goals Re-Evaluation):   Psychosocial: Target Goals: Acknowledge presence or absence of significant depression and/or stress, maximize coping skills, provide positive support system. Participant is able to verbalize types and ability to use techniques and skills needed for reducing stress and depression.   Education: Stress, Anxiety, and Depression - Group verbal and visual presentation to define topics covered.  Reviews how body is impacted by stress, anxiety, and depression.  Also discusses healthy ways to reduce stress and to treat/manage anxiety and depression.  Written material given at graduation. Flowsheet Row Pulmonary Rehab from 11/01/2018 in [facility] Cardiac and Pulmonary Rehab  Date 10/02/18  Educator [facility]  Instruction Review Code 1- [employer] [employer] Understanding      Education: Sleep Hygiene -Provides group verbal and written instruction about how sleep can affect your health.  Define sleep hygiene, discuss sleep cycles and impact of sleep habits. Review good sleep hygiene tips.    Initial Review & Psychosocial Screening:  Initial Psych Review & Screening - 10/15/20 1307      Initial Review   Current issues with Current Sleep Concerns      Family Dynamics   Good Support System? Yes   wife     Barriers   Psychosocial barriers to participate in program There are no identifiable barriers or psychosocial needs.;The patient should benefit from training in stress management and relaxation.      Screening Interventions   Interventions Encouraged to exercise;To provide support and resources with identified psychosocial needs;Provide feedback about the scores to participant    Expected Outcomes Short Term goal: Utilizing psychosocial counselor, staff and physician to assist with identification of specific Stressors or current issues interfering with healing process. Setting desired goal for each stressor or current issue identified.;Long Term Goal: Stressors or current issues are  controlled or eliminated.;Short Term goal: Identification and review with participant of any Quality of Life or Depression concerns found by scoring the questionnaire.;Long Term goal: The participant improves quality of Life and PHQ9 Scores as seen by post scores and/or verbalization of changes           Quality of Life Scores:  Scores of 19 and below usually indicate a poorer quality of life in these areas.  A difference of  2-3 points is a clinically meaningful difference.  A difference of 2-3 points in the total score of the Quality of Life Index has been associated with significant improvement in overall quality of life, self-image, physical symptoms, and general health in studies assessing change in quality of life.  PHQ-9: Recent Review Flowsheet Data    Depression screen [facility] 2/9 12/14/2020 10/18/2020 07/12/2020 06/14/2020 09/24/2018   Decreased Interest 0 2  0 0 0   Down, Depressed, Hopeless 0 0 0 0 0   PHQ - 2 Score 0 2 0 0 0   Altered sleeping 2 2  - - 0   Tired, decreased energy 2 2  - - 1   Change in appetite 0 1 - - 2   Feeling bad or failure about yourself  1 0 - - 0   Trouble concentrating 0 1 - - 0   Moving slowly or fidgety/restless 2 0 - - 0   Suicidal thoughts 0 0 - - 0   PHQ-9 Score 7 8 - - 3   Difficult doing work/chores Very difficult Somewhat difficult - - Not difficult at all     Interpretation of Total Score  Total Score Depression Severity:  1-4 = Minimal depression, 5-9 = Mild depression, 10-14 = Moderate depression, 15-19 = Moderately severe depression, 20-27 = Severe depression   Psychosocial Evaluation and Intervention:  Psychosocial Evaluation - 10/15/20 1319      Psychosocial Evaluation & Interventions   Interventions Encouraged to exercise with the program and follow exercise prescription    Comments Garth is a returning patient who hopes to improve his breathing with exercise while here again. His wife is a  huge support. This last year has been  difficult with Covid restrictions and he is still receiving treatment for his lung cancer. He states he is overall doing as well as can be besides his breathing affecting his sleep some nights.    Expected Outcomes Short: attend pulmonary rehab for educaiton and exercise. Long: maintain positive self care habits.    Continue Psychosocial Services  Follow up required by staff           Psychosocial Re-Evaluation:   Psychosocial Discharge (Final Psychosocial Re-Evaluation):   Education: Education Goals: Education classes will be provided on a weekly basis, covering required topics. Participant will state understanding/return demonstration of topics presented.  Learning Barriers/Preferences:  Learning Barriers/Preferences - 10/15/20 1307      Learning Barriers/Preferences   Learning Barriers Hearing    Learning Preferences None           General Pulmonary Education Topics:  Infection Prevention: - Provides verbal and written material to individual with discussion of infection control including proper hand washing and proper equipment cleaning during exercise session. Flowsheet Row Pulmonary Rehab from 11/04/2020 in [facility] Cardiac and Pulmonary Rehab  Date 10/18/20  Educator AS  Instruction Review Code 1- Verbalizes Understanding      Falls Prevention: - Provides verbal and written material to individual with discussion of falls prevention and safety. Flowsheet Row Pulmonary Rehab from 11/04/2020 in [facility] Cardiac and Pulmonary Rehab  Date 10/18/20  Educator AS  Instruction Review Code 1- Verbalizes Understanding      Chronic Lung Disease Review: - Group verbal instruction with posters, models, PowerPoint presentations and videos,  to review new updates, new respiratory medications, new advancements in procedures and treatments. Providing information on websites and "800" numbers for continued self-education. Includes information about supplement oxygen, available portable  oxygen systems, continuous and intermittent flow rates, oxygen safety, concentrators, and Medicare reimbursement for oxygen. Explanation of Pulmonary Drugs, including class, frequency, complications, importance of spacers, rinsing mouth after steroid MDI's, and proper cleaning methods for nebulizers. Review of basic lung anatomy and physiology related to function, structure, and complications of lung disease. Review of risk factors. Discussion about methods for diagnosing sleep apnea and types of masks and machines for OSA. Includes a review of the use of types of environmental controls: home humidity, furnaces, filters, dust mite/pet prevention, HEPA vacuums. Discussion about weather changes, air quality and the benefits of nasal washing. Instruction on Warning signs, infection symptoms, calling MD promptly, preventive modes, and value of vaccinations. Review of effective airway clearance, coughing and/or vibration techniques. Emphasizing that all should Create an Action Plan. Written material given at graduation. Flowsheet Row Pulmonary Rehab from 02/21/2017 in [facility] Cardiac and Pulmonary Rehab  Date 01/10/17  Educator LB  Instruction Review Code (retired) 2- meets goals/outcomes      AED/CPR: - Group verbal and written instruction with the use of models to demonstrate the basic use of the AED with the basic ABC's of resuscitation.    Anatomy and Cardiac Procedures: - Group verbal and visual presentation and models provide information about basic cardiac anatomy and function. Reviews the testing methods done to diagnose heart disease and the outcomes of the test results. Describes the treatment choices: Medical Management, Angioplasty, or Coronary Bypass Surgery for treating various heart conditions including Myocardial Infarction, Angina, Valve Disease, and Cardiac Arrhythmias.  Written material given at graduation. Flowsheet Row Pulmonary Rehab from 02/21/2017 in [facility] Cardiac and Pulmonary Rehab  Date  02/02/17  Educator CE  Instruction Review Code (retired)  2- meets goals/outcomes      Medication Safety: - Group verbal and visual instruction to review commonly prescribed medications for heart and lung disease. Reviews the medication, class of the drug, and side effects. Includes the steps to properly store meds and maintain the prescription regimen.  Written material given at graduation. Flowsheet Row Pulmonary Rehab from 11/04/2020 in [facility] Cardiac and Pulmonary Rehab  Date 11/04/20  Educator [facility]  Instruction Review Code 1- Verbalizes Understanding      Other: -Provides group and verbal instruction on various topics (see comments)   Knowledge Questionnaire Score:  Knowledge Questionnaire Score - 10/18/20 1605      Knowledge Questionnaire Score   Pre Score 15/18 oxygen            Core Components/Risk Factors/Patient Goals at Admission:  Personal Goals and Risk Factors at Admission - 10/18/20 1606      Core Components/Risk Factors/Patient Goals on Admission    Weight Management Yes;Weight Loss    Intervention Weight Management: Develop a combined nutrition and exercise program designed to reach desired caloric intake, while maintaining appropriate intake of nutrient and fiber, sodium and fats, and appropriate energy expenditure required for the weight goal.;Weight Management: Provide education and appropriate resources to help participant work on and attain dietary goals.;Weight Management/Obesity: Establish reasonable short term and long term weight goals.    Admit Weight 177 lb 8 oz (80.5 kg)    Goal Weight: Short Term 175 lb (79.4 kg)    Goal Weight: Long Term 170 lb (77.1 kg)    Expected Outcomes Short Term: Continue to assess and modify interventions until short term weight is achieved;Long Term: Adherence to nutrition and physical activity/exercise program aimed toward attainment of established weight goal;Weight Loss: Understanding of general recommendations for a  balanced deficit meal plan, which promotes 1-2 lb weight loss per week and includes a negative energy balance of [phone] kcal/d;Understanding recommendations for meals to include 15-35% energy as protein, 25-35% energy from fat, 35-60% energy from carbohydrates, less than 200mg  of dietary cholesterol, 20-35 gm of total fiber daily;Understanding of distribution of calorie intake throughout the day with the consumption of 4-5 meals/snacks    Improve shortness of breath with ADL's Yes    Intervention Provide education, individualized exercise plan and daily activity instruction to help decrease symptoms of SOB with activities of daily living.    Expected Outcomes Short Term: Improve cardiorespiratory fitness to achieve a reduction of symptoms when performing ADLs;Long Term: Be able to perform more ADLs without symptoms or delay the onset of symptoms    Heart Failure Yes    Intervention Provide a combined exercise and nutrition program that is supplemented with education, support and counseling about heart failure. Directed toward relieving symptoms such as shortness of breath, decreased exercise tolerance, and extremity edema.    Expected Outcomes Improve functional capacity of life;Short term: Attendance in program 2-3 days a week with increased exercise capacity. Reported lower sodium intake. Reported increased fruit and vegetable intake. Reports medication compliance.;Short term: Daily weights obtained and reported for increase. Utilizing diuretic protocols set by physician.;Long term: Adoption of self-care skills and reduction of barriers for early signs and symptoms recognition and intervention leading to self-care maintenance.    Hypertension Yes    Intervention Provide education on lifestyle modifcations including regular physical activity/exercise, weight management, moderate sodium restriction and increased consumption of fresh fruit, vegetables, and low fat dairy, alcohol moderation, and smoking  cessation.;Monitor prescription use compliance.    Lipids Yes  Intervention Provide education and support for participant on nutrition & aerobic/resistive exercise along with prescribed medications to achieve LDL 70mg , HDL >40mg .    Expected Outcomes Short Term: Participant states understanding of desired cholesterol values and is compliant with medications prescribed. Participant is following exercise prescription and nutrition guidelines.;Long Term: Cholesterol controlled with medications as prescribed, with individualized exercise RX and with personalized nutrition plan. Value goals: LDL < 70mg , HDL > 40 mg.           Education:Diabetes - Individual verbal and written instruction to review signs/symptoms of diabetes, desired ranges of glucose level fasting, after meals and with exercise. Acknowledge that pre and post exercise glucose checks will be done for 3 sessions at entry of program.   Know Your Numbers and Heart Failure: - Group verbal and visual instruction to discuss disease risk factors for cardiac and pulmonary disease and treatment options.  Reviews associated critical values for Overweight/Obesity, Hypertension, Cholesterol, and Diabetes.  Discusses basics of heart failure: signs/symptoms and treatments.  Introduces Heart Failure Zone chart for action plan for heart failure.  Written material given at graduation.   Core Components/Risk Factors/Patient Goals Review:    Core Components/Risk Factors/Patient Goals at Discharge (Final Review):    ITP Comments:  ITP Comments    Row Name 10/15/20 1303 10/18/20 1611 10/20/20 0704 10/25/20 1128 11/02/20 1022   ITP Comments Initial telephone encounter completed. Diagnosis can be found in [facility] 10/7. EP orientation scheduled for 11/1 2:30pm Completed 6MWT and gym orientation. Initial ITP created and sent for review to Dr. Emily Filbert, Medical Director. 30 Day review completed. Medical Director ITP review done, changes made as directed,  and signed approval by Medical Director. Mohamud called to let us know that he will be out this week with a gout flare up. First full day of exercise!  Patient was oriented to gym and equipment including functions, settings, policies, and procedures.  Patient's individual exercise prescription and treatment plan were reviewed.  All starting workloads were established based on the results of the 6 minute walk test done at initial orientation visit.  The plan for exercise progression was also introduced and progression will be customized based on patient's performance and goals.   [place] Name 11/17/20 0905 11/29/20 0918 12/01/20 1502 12/15/20 0518 01/12/21 0841   ITP Comments 30 Day review completed. Medical Director ITP review done, changes made as directed, and signed approval by Medical Director. Serenity called [name] to let us know that he will be out due to breathing issues and has made a pulmonologist appointment tomorrow morning. He does not believe he will be back to rehab before the new year. We will see what his pulmonologist says about returning, however, if he won't be back before the new year we will have to give away his spot and keep him on a medical hold. Called Mickal to check in regarding his pulmonary appointment. Kuzey doesn't feel he will be back to rehab before the new year due to his breathing issues. He has a chest CT scheduled 01/20/21 to investigate further. He will let us know what is going on and when he will return. We will put him on a medical hold at this time and keep his spot in the program. We will have to give up his time slot at this time. 30 Day review completed. Medical Director ITP review done, changes made as directed, and signed approval by Medical Director.  Remains out for medical reasons 30 Day review completed. Medical Director  ITP review done, changes made as directed, and signed approval by Medical Director.   [place] Name 02/08/21 1604 02/09/21 0604         ITP Comments Cleared to  return to rehab, but called out today with scooter issues 30 Day review completed. Medical Director ITP review done, changes made as directed, and signed approval by Medical Director.             Comments:

## 2021-02-13 ENCOUNTER — Other Ambulatory Visit: Payer: Self-pay | Admitting: Family Medicine

## 2021-02-15 DIAGNOSIS — J449 Chronic obstructive pulmonary disease, unspecified: Secondary | ICD-10-CM | POA: Diagnosis not present

## 2021-02-17 ENCOUNTER — Ambulatory Visit: Payer: PRIVATE HEALTH INSURANCE

## 2021-02-22 ENCOUNTER — Ambulatory Visit: Payer: PRIVATE HEALTH INSURANCE

## 2021-02-22 ENCOUNTER — Encounter: Payer: Self-pay | Admitting: *Deleted

## 2021-02-22 DIAGNOSIS — J449 Chronic obstructive pulmonary disease, unspecified: Secondary | ICD-10-CM

## 2021-02-22 NOTE — Progress Notes (Signed)
Pulmonary Individual Treatment Plan  Patient Details  Name: James [name] MRN: [id number] Date of Birth: July 19, 1929 Referring Provider:   Flowsheet Row Pulmonary Rehab from 10/18/2020 in [facility] Cardiac and Pulmonary Rehab  Referring Provider Lanney Gins      Initial Encounter Date:  Flowsheet Row Pulmonary Rehab from 10/18/2020 in [facility] Cardiac and Pulmonary Rehab  Date 10/18/20      Visit Diagnosis: Chronic obstructive pulmonary disease, unspecified COPD type ([place])  Patient's Home Medications on Admission:  Current Outpatient Medications:  .  albuterol (VENTOLIN HFA) 108 (90 Base) MCG/ACT inhaler, Inhale 2 puffs into the lungs every 6 (six) hours as needed., Disp: , Rfl:  .  atorvastatin (LIPITOR) 80 MG tablet, TAKE 1 TABLET BY MOUTH DAILY AT 6PM, Disp: 90 tablet, Rfl: 3 .  azelastine (OPTIVAR) 0.05 % ophthalmic solution, APPLY 1 DROP INTO THE EYE TWICE DAILY ASDIRECTED, Disp: 6 mL, Rfl: 5 .  beta carotene w/minerals (OCUVITE) tablet, Take 1 tablet by mouth daily., Disp: , Rfl:  .  budesonide (PULMICORT) 0.5 MG/2ML nebulizer solution, Take 2 mLs (0.5 mg total) by nebulization 2 (two) times daily. Dx:j44.9, Disp: 75 mL, Rfl: 12 .  chlorpheniramine-HYDROcodone (TUSSIONEX) 10-8 MG/5ML SUER, TAKE ONE TEASPOONFUL (5ML) EVERY TWELVE HOURS IF NEEDED FOR COUGH, Disp: 115 mL, Rfl: 0 .  ELIQUIS 5 MG TABS tablet, TAKE 1 TABLET BY MOUTH TWICE DAILY, Disp: 180 tablet, Rfl: 4 .  fluticasone (FLONASE) 50 MCG/ACT nasal spray, Place 2 sprays into both nostrils 2 (two) times daily., Disp: 48 g, Rfl: 3 .  ipratropium-albuterol (DUONEB) 0.5-2.5 (3) MG/3ML SOLN, Take 3 mLs by nebulization 4 (four) times daily. , Disp: , Rfl:  .  lansoprazole (PREVACID) 30 MG capsule, TAKE 1 CAPSULE BY MOUTH EVERY DAY, Disp: 90 capsule, Rfl: 3 .  LORazepam (ATIVAN) 1 MG tablet, TAKE 1 TABLET BY MOUTH AT BEDTIME, Disp: 30 tablet, Rfl: 5 .  Magnesium 400 MG CAPS, Take 1 capsule by mouth daily. , Disp: , Rfl:  .  metolazone  (ZAROXOLYN) 5 MG tablet, Take 5 mg by mouth daily., Disp: , Rfl:  .  metoprolol tartrate (LOPRESSOR) 50 MG tablet, TAKE ONE TABLET TWICE DAILY, Disp: 60 tablet, Rfl: 0 .  montelukast (SINGULAIR) 10 MG tablet, TAKE 1 TABLET BY MOUTH AT BEDTIME, Disp: 90 tablet, Rfl: 0 .  MULTIPLE VITAMIN PO, Take 1 tablet by mouth daily. , Disp: , Rfl:  .  Omega-3 Fatty Acids (FISH OIL) 1000 MG CAPS, Take 1 capsule daily by mouth., Disp: , Rfl:  .  predniSONE (DELTASONE) 5 MG tablet, Take 5 mg by mouth daily., Disp: , Rfl:  .  tamsulosin (FLOMAX) 0.4 MG CAPS capsule, Take 0.4 mg by mouth daily., Disp: , Rfl:  .  torsemide (DEMADEX) 20 MG tablet, TAKE 1 TABLET BY MOUTH DAILY. TAKE IN PLACE OF FUROSEMIDE, Disp: 30 tablet, Rfl: 1  Past Medical History: Past Medical History:  Diagnosis Date  . Arthritis   . Back pain   . CAD (coronary artery disease)   . CAP (community acquired pneumonia) 11/06/2017  . Cataract    right  . COPD (chronic obstructive pulmonary disease) ([place])    SPiriva and SYmbicort daily. Albuterol as needed  . Diverticulosis   . Dyspnea    with exertion  . Enlarged prostate    takes Flomax daily  . GERD (gastroesophageal reflux disease)   . History of chicken pox   . History of gout   . History of measles   . History of  mumps   . HOH (hard of hearing)   . Hyperglycemia   . Hyperlipidemia    takes Atorvastatin daily  . Hypertension    takes Metoprolol daily as well as Lotensin HCT  . Hypotension   . Joint pain   . Microscopic colitis   . PAF (paroxysmal atrial fibrillation) ([place]), RVR 03/14/2017  . Pneumonia   . Prostate cancer ([place])   . Stroke [facility])    TIA  . TIA (transient ischemic attack)   . Weakness    numbness and tingling.mainly on right    Tobacco Use: Social History   Tobacco Use  Smoking Status Former Smoker  . Packs/day: 1.00  . Years: 11.00  . Pack years: 11.00  . Types: Cigarettes  . Quit date: 02/12/1984  . Years since quitting: 37.0  Smokeless  Tobacco Never Used  Tobacco Comment   quit smoking 30 yrs ago    Labs: Recent Review Flowsheet Data    Labs for ITP Cardiac and Pulmonary Rehab Latest Ref Rng & Units 10/05/2014 10/07/2014 10/31/2017 12/03/2018 07/13/2019   Cholestrol 0 - 200 mg/dL - 104 106 99 -   LDLCALC 0 - 99 mg/dL - 40 45 34 -   HDL >40 mg/dL - 52 52 55 -   Trlycerides <150 mg/dL - 61 47 51 -   Hemoglobin A1c 4.8 - 5.6 % 5.7 5.7 6.1(H) - 6.1(H)   HCO3 20.0 - 28.0 mmol/L - - - - 30.1(H)   O2SAT % - - - - 75.8       Pulmonary Assessment Scores:  Pulmonary Assessment Scores    Row Name 10/18/20 1600         ADL UCSD   SOB Score total 59     Rest 1     Walk 3     Stairs 4     Bath 2     Dress 3     Shop 2           CAT Score   CAT Score 19           mMRC Score   mMRC Score 1            UCSD: Self-administered rating of dyspnea associated with activities of daily living (ADLs) 6-point scale (0 = "not at all" to 5 = "maximal or unable to do because of breathlessness")  Scoring Scores range from 0 to 120.  Minimally important difference is 5 units  CAT: CAT can identify the health impairment of COPD patients and is better correlated with disease progression.  CAT has a scoring range of zero to 40. The CAT score is classified into four groups of low (less than 10), medium (10 - 20), high (21-30) and very high (31-40) based on the impact level of disease on health status. A CAT score over 10 suggests significant symptoms.  A worsening CAT score could be explained by an exacerbation, poor medication adherence, poor inhaler technique, or progression of COPD or comorbid conditions.  CAT MCID is 2 points  mMRC: mMRC (Modified Medical Research Council) Dyspnea Scale is used to assess the degree of baseline functional disability in patients of respiratory disease due to dyspnea. No minimal important difference is established. A decrease in score of 1 point or greater is considered a positive change.    Pulmonary Function Assessment:   Exercise Target Goals: Exercise Program Goal: Individual exercise prescription set using results from initial 6 min walk test and THRR while considering  patient's activity barriers and safety.   Exercise Prescription Goal: Initial exercise prescription builds to 30-45 minutes a day of aerobic activity, 2-3 days per week.  Home exercise guidelines will be given to patient during program as part of exercise prescription that the participant will acknowledge.  Education: Aerobic Exercise: - Group verbal and visual presentation on the components of exercise prescription. Introduces F.I.T.T principle from ACSM for exercise prescriptions.  Reviews F.I.T.T. principles of aerobic exercise including progression. Written material given at graduation. Flowsheet Row Pulmonary Rehab from 02/21/2017 in [facility] Cardiac and Pulmonary Rehab  Date 01/17/17  Educator [facility]  Instruction Review Code (retired) 2- meets goals/outcomes      Education: Resistance Exercise: - Group verbal and visual presentation on the components of exercise prescription. Introduces F.I.T.T principle from ACSM for exercise prescriptions  Reviews F.I.T.T. principles of resistance exercise including progression. Written material given at graduation.    Education: Exercise & Equipment Safety: - Individual verbal instruction and demonstration of equipment use and safety with use of the equipment. Flowsheet Row Pulmonary Rehab from 11/04/2020 in [facility] Cardiac and Pulmonary Rehab  Date 10/18/20  Educator AS  Instruction Review Code 1- Verbalizes Understanding      Education: Exercise Physiology & General Exercise Guidelines: - Group verbal and written instruction with models to review the exercise physiology of the cardiovascular system and associated critical values. Provides general exercise guidelines with specific guidelines to those with heart or lung disease.  Flowsheet Row Pulmonary Rehab from  02/21/2017 in [facility] Cardiac and Pulmonary Rehab  Date 12/20/16  Educator [facility]  Instruction Review Code (retired) 2- meets goals/outcomes      Education: Flexibility, Balance, Mind/Body Relaxation: - Group verbal and visual presentation with interactive activity on the components of exercise prescription. Introduces F.I.T.T principle from ACSM for exercise prescriptions. Reviews F.I.T.T. principles of flexibility and balance exercise training including progression. Also discusses the mind body connection.  Reviews various relaxation techniques to help reduce and manage stress (i.e. Deep breathing, progressive muscle relaxation, and visualization). Balance handout provided to take home. Written material given at graduation. Flowsheet Row Pulmonary Rehab from 11/01/2018 in [facility] Cardiac and Pulmonary Rehab  Date 10/09/18  Educator AS  Instruction Review Code 1- Verbalizes Understanding      Activity Barriers & Risk Stratification:  Activity Barriers & Cardiac Risk Stratification - 10/15/20 1305      Activity Barriers & Cardiac Risk Stratification   Activity Barriers Back Problems;Arthritis;Shortness of Breath           6 Minute Walk:  6 Minute Walk    Row Name 10/18/20 1546         6 Minute Walk   Phase Initial     Distance 495 feet     Walk Time 5.3 minutes     # of Rest Breaks 1     MPH 1.06     METS 1.77     RPE 19     Perceived Dyspnea  3     VO2 Peak 0.6     Symptoms Yes (comment)     Comments shortness of breath     Resting HR 98 bpm     Resting BP 108/52     Resting Oxygen Saturation  95 %     Exercise Oxygen Saturation  during 6 min walk 89 %     Max Ex. HR 110 bpm     Max Ex. BP 116/54     2 Minute Post BP 104/56  Interval HR   1 Minute HR 98     2 Minute HR 104     3 Minute HR 108     4 Minute HR 108     5 Minute HR 109     6 Minute HR 110     2 Minute Post HR 99     Interval Heart Rate? Yes           Interval Oxygen   Interval Oxygen? Yes      Baseline Oxygen Saturation % 95 %     1 Minute Oxygen Saturation % 93 %     1 Minute Liters of Oxygen 0 L     2 Minute Oxygen Saturation % 90 %     2 Minute Liters of Oxygen 0 L     3 Minute Oxygen Saturation % 90 %     3 Minute Liters of Oxygen 0 L     4 Minute Oxygen Saturation % 93 %     4 Minute Liters of Oxygen 0 L     5 Minute Oxygen Saturation % 93 %     5 Minute Liters of Oxygen 0 L     6 Minute Oxygen Saturation % 91 %     6 Minute Liters of Oxygen 0 L     2 Minute Post Oxygen Saturation % 95 %     2 Minute Post Liters of Oxygen 0 L           Oxygen Initial Assessment:  Oxygen Initial Assessment - 11/02/20 1023      Home Oxygen   Home Oxygen Device Home Concentrator    Sleep Oxygen Prescription Continuous    Liters per minute 2    Home Exercise Oxygen Prescription None    Home Resting Oxygen Prescription None    Compliance with Home Oxygen Use Yes      Intervention   Short Term Goals To learn and demonstrate proper pursed lip breathing techniques or other breathing techniques.    Long  Term Goals --           Oxygen Re-Evaluation:  Oxygen Re-Evaluation    [place] Name 11/02/20 1025             Program Oxygen Prescription   Program Oxygen Prescription None               Home Oxygen   Sleep Oxygen Prescription Continuous       Liters per minute 2       Home Exercise Oxygen Prescription None       Home Resting Oxygen Prescription None       Compliance with Home Oxygen Use Yes               Goals/Expected Outcomes   Short Term Goals To learn and demonstrate proper pursed lip breathing techniques or other breathing techniques.       Long  Term Goals Exhibits proper breathing techniques, such as pursed lip breathing or other method taught during program session       Comments Reviewed PLB technique with pt.  Talked about how it works and it's importance in maintaining their exercise saturations.       Goals/Expected Outcomes Short: Become more  profiecient at using PLB.   Long: Become independent at using PLB.              Oxygen Discharge (Final Oxygen Re-Evaluation):  Oxygen Re-Evaluation - 11/02/20 1025  Program Oxygen Prescription   Program Oxygen Prescription None      Home Oxygen   Sleep Oxygen Prescription Continuous    Liters per minute 2    Home Exercise Oxygen Prescription None    Home Resting Oxygen Prescription None    Compliance with Home Oxygen Use Yes      Goals/Expected Outcomes   Short Term Goals To learn and demonstrate proper pursed lip breathing techniques or other breathing techniques.    Long  Term Goals Exhibits proper breathing techniques, such as pursed lip breathing or other method taught during program session    Comments Reviewed PLB technique with pt.  Talked about how it works and it's importance in maintaining their exercise saturations.    Goals/Expected Outcomes Short: Become more profiecient at using PLB.   Long: Become independent at using PLB.           Initial Exercise Prescription:  Initial Exercise Prescription - 10/18/20 1500      Date of Initial Exercise RX and Referring Provider   Date 10/18/20    Referring Provider Aleskerov      Treadmill   MPH 1    Grade 0    Minutes 15    METs 1.77      Recumbant Bike   Level 1    RPM 60    Minutes 15    METs 1.5      NuStep   Level 1    SPM 80    Minutes 15    METs 1.5      Arm Ergometer   Level 1    RPM 30    Minutes 15    METs 1.5      T5 Nustep   Level 1    SPM 80    Minutes 15    METs 1.5      Prescription Details   Frequency (times per week) 3    Duration Progress to 30 minutes of continuous aerobic without signs/symptoms of physical distress      Intensity   THRR 40-80% of Max Heartrate 111-124    Ratings of Perceived Exertion 11-13    Perceived Dyspnea 0-4      Progression   Progression Continue to progress workloads to maintain intensity without signs/symptoms of physical distress.       Resistance Training   Training Prescription Yes    Weight 3 lb    Reps 10-15           Perform Capillary Blood Glucose checks as needed.  Exercise Prescription Changes:  Exercise Prescription Changes    Row Name 10/18/20 1500 11/17/20 1200 11/30/20 0900         Response to Exercise   Blood Pressure (Admit) 108/52 112/66 106/56     Blood Pressure (Exercise) 116/54 110/60 122/62     Blood Pressure (Exit) 104/56 114/64 108/60     Heart Rate (Admit) 98 bpm 95 bpm 77 bpm     Heart Rate (Exercise) 110 bpm 104 bpm 113 bpm     Heart Rate (Exit) 99 bpm 89 bpm 68 bpm     Oxygen Saturation (Admit) 95 % 89 % 91 %     Oxygen Saturation (Exercise) 89 % 90 % 94 %     Oxygen Saturation (Exit) 95 % 93 % 96 %     Rating of Perceived Exertion (Exercise) 19 12 12      Perceived Dyspnea (Exercise) 3 2 3      Symptoms SOB  SOB SOB     Comments -- 3rd exercise day --     Duration -- Progress to 30 minutes of  aerobic without signs/symptoms of physical distress Progress to 30 minutes of  aerobic without signs/symptoms of physical distress     Intensity -- THRR unchanged THRR unchanged           Progression   Progression -- Continue to progress workloads to maintain intensity without signs/symptoms of physical distress. Continue to progress workloads to maintain intensity without signs/symptoms of physical distress.     Average METs -- 2.55 1.77           Resistance Training   Training Prescription -- Yes Yes     Weight --  3lb 3 lb     Reps -- 10-15 10-15           Interval Training   Interval Training -- No No           Treadmill   MPH -- -- 0.9     Grade -- -- 0     Minutes -- -- 15     METs -- -- 1.77           Recumbant Bike   Level -- 1 --     RPM -- 60 --     Minutes -- 15 --     METs -- 2.97 --           NuStep   Level -- 1 --     SPM -- 80 --     Minutes -- 15 --     METs -- 2.1 --            Exercise Comments:  Exercise Comments    Row Name 11/02/20 1022            Exercise Comments First full day of exercise!  Patient was oriented to gym and equipment including functions, settings, policies, and procedures.  Patient's individual exercise prescription and treatment plan were reviewed.  All starting workloads were established based on the results of the 6 minute walk test done at initial orientation visit.  The plan for exercise progression was also introduced and progression will be customized based on patient's performance and goals.              Exercise Goals and Review:  Exercise Goals    Row Name 10/18/20 1558             Exercise Goals   Increase Physical Activity Yes       Intervention Provide advice, education, support and counseling about physical activity/exercise needs.;Develop an individualized exercise prescription for aerobic and resistive training based on initial evaluation findings, risk stratification, comorbidities and participant's personal goals.       Expected Outcomes Short Term: Attend rehab on a regular basis to increase amount of physical activity.;Long Term: Add in home exercise to make exercise part of routine and to increase amount of physical activity.;Long Term: Exercising regularly at least 3-5 days a week.       Increase Strength and Stamina Yes       Intervention Provide advice, education, support and counseling about physical activity/exercise needs.;Develop an individualized exercise prescription for aerobic and resistive training based on initial evaluation findings, risk stratification, comorbidities and participant's personal goals.       Expected Outcomes Short Term: Increase workloads from initial exercise prescription for resistance, speed, and METs.;Short Term: Perform resistance training exercises routinely during rehab and add  in resistance training at home;Long Term: Improve cardiorespiratory fitness, muscular endurance and strength as measured by increased METs and functional capacity (6MWT)        Able to understand and use rate of perceived exertion (RPE) scale Yes       Intervention Provide education and explanation on how to use RPE scale       Expected Outcomes Short Term: Able to use RPE daily in rehab to express subjective intensity level;Long Term:  Able to use RPE to guide intensity level when exercising independently       Able to understand and use Dyspnea scale Yes       Intervention Provide education and explanation on how to use Dyspnea scale       Expected Outcomes Short Term: Able to use Dyspnea scale daily in rehab to express subjective sense of shortness of breath during exertion;Long Term: Able to use Dyspnea scale to guide intensity level when exercising independently       Knowledge and understanding of Target Heart Rate Range (THRR) Yes       Intervention Provide education and explanation of THRR including how the numbers were predicted and where they are located for reference       Expected Outcomes Short Term: Able to state/look up THRR;Short Term: Able to use daily as guideline for intensity in rehab;Long Term: Able to use THRR to govern intensity when exercising independently       Able to check pulse independently Yes       Intervention Review the importance of being able to check your own pulse for safety during independent exercise       Expected Outcomes Short Term: Able to explain why pulse checking is important during independent exercise;Long Term: Able to check pulse independently and accurately       Understanding of Exercise Prescription Yes       Intervention Provide education, explanation, and written materials on patient's individual exercise prescription       Expected Outcomes Short Term: Able to explain program exercise prescription;Long Term: Able to explain home exercise prescription to exercise independently              Exercise Goals Re-Evaluation :  Exercise Goals Re-Evaluation    Row Name 11/02/20 1022 11/17/20 1232 11/30/20 0906 01/12/21  1319 02/08/21 1606     Exercise Goal Re-Evaluation   Exercise Goals Review Able to understand and use rate of perceived exertion (RPE) scale;Able to understand and use Dyspnea scale;Knowledge and understanding of Target Heart Rate Range (THRR);Understanding of Exercise Prescription Increase Physical Activity;Increase Strength and Stamina Increase Physical Activity;Increase Strength and Stamina -- --   Comments Reviewed RPE and dyspnea scales, THR and program prescription with pt today.  Pt voiced understanding and was given a copy of goals to take home. Daxon has done well so far with exercise.  Lowest oxygen has been 89%.  Staff will monitor progress. Benson has had more SOB recently and is seeing his Dr.  Aldean Ast will follow up when he returns Continues to be on medical hold for CT scan next month. Continues to be out   Expected Outcomes Short: Use RPE daily to regulate intensity. Long: Follow program prescription in THR. Short:  attend consistently Long: increase overall stamina Short: check in with Dr about increased SOB Long: get back to [facility] -- --          Discharge Exercise Prescription (Final Exercise Prescription Changes):  Exercise Prescription Changes - 11/30/20 0900  Response to Exercise   Blood Pressure (Admit) 106/56    Blood Pressure (Exercise) 122/62    Blood Pressure (Exit) 108/60    Heart Rate (Admit) 77 bpm    Heart Rate (Exercise) 113 bpm    Heart Rate (Exit) 68 bpm    Oxygen Saturation (Admit) 91 %    Oxygen Saturation (Exercise) 94 %    Oxygen Saturation (Exit) 96 %    Rating of Perceived Exertion (Exercise) 12    Perceived Dyspnea (Exercise) 3    Symptoms SOB    Duration Progress to 30 minutes of  aerobic without signs/symptoms of physical distress    Intensity THRR unchanged      Progression   Progression Continue to progress workloads to maintain intensity without signs/symptoms of physical distress.    Average METs 1.77      Resistance Training   Training  Prescription Yes    Weight 3 lb    Reps 10-15      Interval Training   Interval Training No      Treadmill   MPH 0.9    Grade 0    Minutes 15    METs 1.77           Nutrition:  Target Goals: Understanding of nutrition guidelines, daily intake of sodium 1500mg , cholesterol 200mg , calories 30% from fat and 7% or less from saturated fats, daily to have 5 or more servings of fruits and vegetables.  Education: All About Nutrition: -Group instruction provided by verbal, written material, interactive activities, discussions, models, and posters to present general guidelines for heart healthy nutrition including fat, fiber, MyPlate, the role of sodium in heart healthy nutrition, utilization of the nutrition label, and utilization of this knowledge for meal planning. Follow up email sent as well. Written material given at graduation. Flowsheet Row Pulmonary Rehab from 11/01/2018 in [facility] Cardiac and Pulmonary Rehab  Date 10/23/18  Educator LB  Instruction Review Code 1- Verbalizes Understanding      Biometrics:  Pre Biometrics - 10/18/20 1559      Pre Biometrics   Height 5' 3.5" (1.613 m)    Weight 177 lb 8 oz (80.5 kg)    BMI (Calculated) 30.95    Single Leg Stand 2.31 seconds            Nutrition Therapy Plan and Nutrition Goals:   Nutrition Assessments:  Nutrition Assessments - 10/18/20 1606      MEDFICTS Scores   Pre Score 16          MEDIFICTS Score Key:  ?70 Need to make dietary changes   40-70 Heart Healthy Diet  ? 40 Therapeutic Level Cholesterol Diet   Picture Your Plate Scores:  <50 Unhealthy dietary pattern with much room for improvement.  41-50 Dietary pattern unlikely to meet recommendations for good health and room for improvement.  51-60 More healthful dietary pattern, with some room for improvement.   >60 Healthy dietary pattern, although there may be some specific behaviors that could be improved.   Nutrition Goals  Re-Evaluation:   Nutrition Goals Discharge (Final Nutrition Goals Re-Evaluation):   Psychosocial: Target Goals: Acknowledge presence or absence of significant depression and/or stress, maximize coping skills, provide positive support system. Participant is able to verbalize types and ability to use techniques and skills needed for reducing stress and depression.   Education: Stress, Anxiety, and Depression - Group verbal and visual presentation to define topics covered.  Reviews how body is impacted by stress, anxiety, and depression.  Also discusses healthy ways to reduce stress and to treat/manage anxiety and depression.  Written material given at graduation. Flowsheet Row Pulmonary Rehab from 11/01/2018 in [facility] Cardiac and Pulmonary Rehab  Date 10/02/18  Educator [facility]  Instruction Review Code 1- [employer] Understanding      Education: Sleep Hygiene -Provides group verbal and written instruction about how sleep can affect your health.  Define sleep hygiene, discuss sleep cycles and impact of sleep habits. Review good sleep hygiene tips.    Initial Review & Psychosocial Screening:  Initial Psych Review & Screening - 10/15/20 1307      Initial Review   Current issues with Current Sleep Concerns      Family Dynamics   Good Support System? Yes   wife     Barriers   Psychosocial barriers to participate in program There are no identifiable barriers or psychosocial needs.;The patient should benefit from training in stress management and relaxation.      Screening Interventions   Interventions Encouraged to exercise;To provide support and resources with identified psychosocial needs;Provide feedback about the scores to participant    Expected Outcomes Short Term goal: Utilizing psychosocial counselor, staff and physician to assist with identification of specific Stressors or current issues interfering with healing process. Setting desired goal for each stressor or current issue  identified.;Long Term Goal: Stressors or current issues are controlled or eliminated.;Short Term goal: Identification and review with participant of any Quality of Life or Depression concerns found by scoring the questionnaire.;Long Term goal: The participant improves quality of Life and PHQ9 Scores as seen by post scores and/or verbalization of changes           Quality of Life Scores:  Scores of 19 and below usually indicate a poorer quality of life in these areas.  A difference of  2-3 points is a clinically meaningful difference.  A difference of 2-3 points in the total score of the Quality of Life Index has been associated with significant improvement in overall quality of life, self-image, physical symptoms, and general health in studies assessing change in quality of life.  PHQ-9: Recent Review Flowsheet Data    Depression screen [facility] 2/9 12/14/2020 10/18/2020 07/12/2020 06/14/2020 09/24/2018   Decreased Interest 0 2  0 0 0   Down, Depressed, Hopeless 0 0 0 0 0   PHQ - 2 Score 0 2 0 0 0   Altered sleeping 2 2  - - 0   Tired, decreased energy 2 2  - - 1   Change in appetite 0 1 - - 2   Feeling bad or failure about yourself  1 0 - - 0   Trouble concentrating 0 1 - - 0   Moving slowly or fidgety/restless 2 0 - - 0   Suicidal thoughts 0 0 - - 0   PHQ-9 Score 7 8 - - 3   Difficult doing work/chores Very difficult Somewhat difficult - - Not difficult at all     Interpretation of Total Score  Total Score Depression Severity:  1-4 = Minimal depression, 5-9 = Mild depression, 10-14 = Moderate depression, 15-19 = Moderately severe depression, 20-27 = Severe depression   Psychosocial Evaluation and Intervention:  Psychosocial Evaluation - 10/15/20 1319      Psychosocial Evaluation & Interventions   Interventions Encouraged to exercise with the program and follow exercise prescription    Comments Dayten is a returning patient who hopes to improve his breathing with exercise while here again.  His wife is  a huge support. This last year has been difficult with Covid restrictions and he is still receiving treatment for his lung cancer. He states he is overall doing as well as can be besides his breathing affecting his sleep some nights.    Expected Outcomes Short: attend pulmonary rehab for educaiton and exercise. Long: maintain positive self care habits.    Continue Psychosocial Services  Follow up required by staff           Psychosocial Re-Evaluation:   Psychosocial Discharge (Final Psychosocial Re-Evaluation):   Education: Education Goals: Education classes will be provided on a weekly basis, covering required topics. Participant will state understanding/return demonstration of topics presented.  Learning Barriers/Preferences:  Learning Barriers/Preferences - 10/15/20 1307      Learning Barriers/Preferences   Learning Barriers Hearing    Learning Preferences None           General Pulmonary Education Topics:  Infection Prevention: - Provides verbal and written material to individual with discussion of infection control including proper hand washing and proper equipment cleaning during exercise session. Flowsheet Row Pulmonary Rehab from 11/04/2020 in [facility] Cardiac and Pulmonary Rehab  Date 10/18/20  Educator AS  Instruction Review Code 1- Verbalizes Understanding      Falls Prevention: - Provides verbal and written material to individual with discussion of falls prevention and safety. Flowsheet Row Pulmonary Rehab from 11/04/2020 in [facility] Cardiac and Pulmonary Rehab  Date 10/18/20  Educator AS  Instruction Review Code 1- Verbalizes Understanding      Chronic Lung Disease Review: - Group verbal instruction with posters, models, PowerPoint presentations and videos,  to review new updates, new respiratory medications, new advancements in procedures and treatments. Providing information on websites and "800" numbers for continued self-education. Includes  information about supplement oxygen, available portable oxygen systems, continuous and intermittent flow rates, oxygen safety, concentrators, and Medicare reimbursement for oxygen. Explanation of Pulmonary Drugs, including class, frequency, complications, importance of spacers, rinsing mouth after steroid MDI's, and proper cleaning methods for nebulizers. Review of basic lung anatomy and physiology related to function, structure, and complications of lung disease. Review of risk factors. Discussion about methods for diagnosing sleep apnea and types of masks and machines for OSA. Includes a review of the use of types of environmental controls: home humidity, furnaces, filters, dust mite/pet prevention, HEPA vacuums. Discussion about weather changes, air quality and the benefits of nasal washing. Instruction on Warning signs, infection symptoms, calling MD promptly, preventive modes, and value of vaccinations. Review of effective airway clearance, coughing and/or vibration techniques. Emphasizing that all should Create an Action Plan. Written material given at graduation. Flowsheet Row Pulmonary Rehab from 02/21/2017 in [facility] Cardiac and Pulmonary Rehab  Date 01/10/17  Educator LB  Instruction Review Code (retired) 2- meets goals/outcomes      AED/CPR: - Group verbal and written instruction with the use of models to demonstrate the basic use of the AED with the basic ABC's of resuscitation.    Anatomy and Cardiac Procedures: - Group verbal and visual presentation and models provide information about basic cardiac anatomy and function. Reviews the testing methods done to diagnose heart disease and the outcomes of the test results. Describes the treatment choices: Medical Management, Angioplasty, or Coronary Bypass Surgery for treating various heart conditions including Myocardial Infarction, Angina, Valve Disease, and Cardiac Arrhythmias.  Written material given at graduation. Flowsheet Row Pulmonary Rehab  from 02/21/2017 in [facility] Cardiac and Pulmonary Rehab  Date 02/02/17  Educator CE  Instruction Review Code (retired)  2- meets goals/outcomes      Medication Safety: - Group verbal and visual instruction to review commonly prescribed medications for heart and lung disease. Reviews the medication, class of the drug, and side effects. Includes the steps to properly store meds and maintain the prescription regimen.  Written material given at graduation. Flowsheet Row Pulmonary Rehab from 11/04/2020 in [facility] Cardiac and Pulmonary Rehab  Date 11/04/20  Educator [facility]  Instruction Review Code 1- Verbalizes Understanding      Other: -Provides group and verbal instruction on various topics (see comments)   Knowledge Questionnaire Score:  Knowledge Questionnaire Score - 10/18/20 1605      Knowledge Questionnaire Score   Pre Score 15/18 oxygen            Core Components/Risk Factors/Patient Goals at Admission:  Personal Goals and Risk Factors at Admission - 10/18/20 1606      Core Components/Risk Factors/Patient Goals on Admission    Weight Management Yes;Weight Loss    Intervention Weight Management: Develop a combined nutrition and exercise program designed to reach desired caloric intake, while maintaining appropriate intake of nutrient and fiber, sodium and fats, and appropriate energy expenditure required for the weight goal.;Weight Management: Provide education and appropriate resources to help participant work on and attain dietary goals.;Weight Management/Obesity: Establish reasonable short term and long term weight goals.    Admit Weight 177 lb 8 oz (80.5 kg)    Goal Weight: Short Term 175 lb (79.4 kg)    Goal Weight: Long Term 170 lb (77.1 kg)    Expected Outcomes Short Term: Continue to assess and modify interventions until short term weight is achieved;Long Term: Adherence to nutrition and physical activity/exercise program aimed toward attainment of established weight goal;Weight  Loss: Understanding of general recommendations for a balanced deficit meal plan, which promotes 1-2 lb weight loss per week and includes a negative energy balance of [phone] kcal/d;Understanding recommendations for meals to include 15-35% energy as protein, 25-35% energy from fat, 35-60% energy from carbohydrates, less than 200mg  of dietary cholesterol, 20-35 gm of total fiber daily;Understanding of distribution of calorie intake throughout the day with the consumption of 4-5 meals/snacks    Improve shortness of breath with ADL's Yes    Intervention Provide education, individualized exercise plan and daily activity instruction to help decrease symptoms of SOB with activities of daily living.    Expected Outcomes Short Term: Improve cardiorespiratory fitness to achieve a reduction of symptoms when performing ADLs;Long Term: Be able to perform more ADLs without symptoms or delay the onset of symptoms    Heart Failure Yes    Intervention Provide a combined exercise and nutrition program that is supplemented with education, support and counseling about heart failure. Directed toward relieving symptoms such as shortness of breath, decreased exercise tolerance, and extremity edema.    Expected Outcomes Improve functional capacity of life;Short term: Attendance in program 2-3 days a week with increased exercise capacity. Reported lower sodium intake. Reported increased fruit and vegetable intake. Reports medication compliance.;Short term: Daily weights obtained and reported for increase. Utilizing diuretic protocols set by physician.;Long term: Adoption of self-care skills and reduction of barriers for early signs and symptoms recognition and intervention leading to self-care maintenance.    Hypertension Yes    Intervention Provide education on lifestyle modifcations including regular physical activity/exercise, weight management, moderate sodium restriction and increased consumption of fresh fruit, vegetables,  and low fat dairy, alcohol moderation, and smoking cessation.;Monitor prescription use compliance.    Lipids Yes  Intervention Provide education and support for participant on nutrition & aerobic/resistive exercise along with prescribed medications to achieve LDL 70mg , HDL >40mg .    Expected Outcomes Short Term: Participant states understanding of desired cholesterol values and is compliant with medications prescribed. Participant is following exercise prescription and nutrition guidelines.;Long Term: Cholesterol controlled with medications as prescribed, with individualized exercise RX and with personalized nutrition plan. Value goals: LDL < 70mg , HDL > 40 mg.           Education:Diabetes - Individual verbal and written instruction to review signs/symptoms of diabetes, desired ranges of glucose level fasting, after meals and with exercise. Acknowledge that pre and post exercise glucose checks will be done for 3 sessions at entry of program.   Know Your Numbers and Heart Failure: - Group verbal and visual instruction to discuss disease risk factors for cardiac and pulmonary disease and treatment options.  Reviews associated critical values for Overweight/Obesity, Hypertension, Cholesterol, and Diabetes.  Discusses basics of heart failure: signs/symptoms and treatments.  Introduces Heart Failure Zone chart for action plan for heart failure.  Written material given at graduation.   Core Components/Risk Factors/Patient Goals Review:    Core Components/Risk Factors/Patient Goals at Discharge (Final Review):    ITP Comments:  ITP Comments    Row Name 10/15/20 1303 10/18/20 1611 10/20/20 0704 10/25/20 1128 11/02/20 1022   ITP Comments Initial telephone encounter completed. Diagnosis can be found in [facility] 10/7. EP orientation scheduled for 11/1 2:30pm Completed 6MWT and gym orientation. Initial ITP created and sent for review to Dr. Emily Filbert, Medical Director. 30 Day review completed. Medical  Director ITP review done, changes made as directed, and signed approval by Medical Director. Odin called to let us know that he will be out this week with a gout flare up. First full day of exercise!  Patient was oriented to gym and equipment including functions, settings, policies, and procedures.  Patient's individual exercise prescription and treatment plan were reviewed.  All starting workloads were established based on the results of the 6 minute walk test done at initial orientation visit.  The plan for exercise progression was also introduced and progression will be customized based on patient's performance and goals.   [place] Name 11/17/20 0905 11/29/20 0918 12/01/20 1502 12/15/20 0518 01/12/21 0841   ITP Comments 30 Day review completed. Medical Director ITP review done, changes made as directed, and signed approval by Medical Director. Naftuli called [name] to let us know that he will be out due to breathing issues and has made a pulmonologist appointment tomorrow morning. He does not believe he will be back to rehab before the new year. We will see what his pulmonologist says about returning, however, if he won't be back before the new year we will have to give away his spot and keep him on a medical hold. Called Rawn to check in regarding his pulmonary appointment. Juanangel doesn't feel he will be back to rehab before the new year due to his breathing issues. He has a chest CT scheduled 01/20/21 to investigate further. He will let us know what is going on and when he will return. We will put him on a medical hold at this time and keep his spot in the program. We will have to give up his time slot at this time. 30 Day review completed. Medical Director ITP review done, changes made as directed, and signed approval by Medical Director.  Remains out for medical reasons 30 Day review completed. Medical Director  ITP review done, changes made as directed, and signed approval by Medical Director.   [place] Name 02/08/21 1604  02/09/21 0604 02/22/21 0833       ITP Comments Cleared to return to rehab, but called out today with scooter issues 30 Day review completed. Medical Director ITP review done, changes made as directed, and signed approval by Medical Director. Dawon's wife called.  He continues to have problems with his breathing and is not comfortable driving to rehab.  He would like ot be discharged at this time.            Comments: Discharge ITP

## 2021-02-22 NOTE — Progress Notes (Signed)
Discharge Progress Report  Patient Details  Name: James [name] MRN: [id number] Date of Birth: 1929-12-15 Referring Provider:   Flowsheet Row Pulmonary Rehab from 10/18/2020 in [facility] Cardiac and Pulmonary Rehab  Referring Provider Aleskerov       Number of Visits: 6  Reason for Discharge:  Early Exit:  Personal and Lack of attendance  Smoking History:  Social History   Tobacco Use  Smoking Status Former Smoker  . Packs/day: 1.00  . Years: 11.00  . Pack years: 11.00  . Types: Cigarettes  . Quit date: 02/12/1984  . Years since quitting: 37.0  Smokeless Tobacco Never Used  Tobacco Comment   quit smoking 30 yrs ago    Diagnosis:  Chronic obstructive pulmonary disease, unspecified COPD type ([place])  ADL UCSD:  Pulmonary Assessment Scores    Row Name 10/18/20 1600         ADL UCSD   SOB Score total 59     Rest 1     Walk 3     Stairs 4     Bath 2     Dress 3     Shop 2           CAT Score   CAT Score 19           mMRC Score   mMRC Score 1            Initial Exercise Prescription:  Initial Exercise Prescription - 10/18/20 1500      Date of Initial Exercise RX and Referring Provider   Date 10/18/20    Referring Provider Aleskerov      Treadmill   MPH 1    Grade 0    Minutes 15    METs 1.77      Recumbant Bike   Level 1    RPM 60    Minutes 15    METs 1.5      NuStep   Level 1    SPM 80    Minutes 15    METs 1.5      Arm Ergometer   Level 1    RPM 30    Minutes 15    METs 1.5      T5 Nustep   Level 1    SPM 80    Minutes 15    METs 1.5      Prescription Details   Frequency (times per week) 3    Duration Progress to 30 minutes of continuous aerobic without signs/symptoms of physical distress      Intensity   THRR 40-80% of Max Heartrate 111-124    Ratings of Perceived Exertion 11-13    Perceived Dyspnea 0-4      Progression   Progression Continue to progress workloads to maintain intensity without signs/symptoms of physical  distress.      Resistance Training   Training Prescription Yes    Weight 3 lb    Reps 10-15           Discharge Exercise Prescription (Final Exercise Prescription Changes):  Exercise Prescription Changes - 11/30/20 0900      Response to Exercise   Blood Pressure (Admit) 106/56    Blood Pressure (Exercise) 122/62    Blood Pressure (Exit) 108/60    Heart Rate (Admit) 77 bpm    Heart Rate (Exercise) 113 bpm    Heart Rate (Exit) 68 bpm    Oxygen Saturation (Admit) 91 %    Oxygen Saturation (Exercise) 94 %  Oxygen Saturation (Exit) 96 %    Rating of Perceived Exertion (Exercise) 12    Perceived Dyspnea (Exercise) 3    Symptoms SOB    Duration Progress to 30 minutes of  aerobic without signs/symptoms of physical distress    Intensity THRR unchanged      Progression   Progression Continue to progress workloads to maintain intensity without signs/symptoms of physical distress.    Average METs 1.77      Resistance Training   Training Prescription Yes    Weight 3 lb    Reps 10-15      Interval Training   Interval Training No      Treadmill   MPH 0.9    Grade 0    Minutes 15    METs 1.77           Functional Capacity:  6 Minute Walk    Row Name 10/18/20 1546         6 Minute Walk   Phase Initial     Distance 495 feet     Walk Time 5.3 minutes     # of Rest Breaks 1     MPH 1.06     METS 1.77     RPE 19     Perceived Dyspnea  3     VO2 Peak 0.6     Symptoms Yes (comment)     Comments shortness of breath     Resting HR 98 bpm     Resting BP 108/52     Resting Oxygen Saturation  95 %     Exercise Oxygen Saturation  during 6 min walk 89 %     Max Ex. HR 110 bpm     Max Ex. BP 116/54     2 Minute Post BP 104/56           Interval HR   1 Minute HR 98     2 Minute HR 104     3 Minute HR 108     4 Minute HR 108     5 Minute HR 109     6 Minute HR 110     2 Minute Post HR 99     Interval Heart Rate? Yes           Interval Oxygen   Interval  Oxygen? Yes     Baseline Oxygen Saturation % 95 %     1 Minute Oxygen Saturation % 93 %     1 Minute Liters of Oxygen 0 L     2 Minute Oxygen Saturation % 90 %     2 Minute Liters of Oxygen 0 L     3 Minute Oxygen Saturation % 90 %     3 Minute Liters of Oxygen 0 L     4 Minute Oxygen Saturation % 93 %     4 Minute Liters of Oxygen 0 L     5 Minute Oxygen Saturation % 93 %     5 Minute Liters of Oxygen 0 L     6 Minute Oxygen Saturation % 91 %     6 Minute Liters of Oxygen 0 L     2 Minute Post Oxygen Saturation % 95 %     2 Minute Post Liters of Oxygen 0 L            Psychological, QOL, Others - Outcomes: PHQ 2/9: Depression screen [facility] 2/9 12/14/2020 10/18/2020 07/12/2020 06/14/2020 09/24/2018  Decreased Interest 0  2 0 0 0  Down, Depressed, Hopeless 0 0 0 0 0  PHQ - 2 Score 0 2 0 0 0  Altered sleeping 2 2 - - 0  Tired, decreased energy 2 2 - - 1  Change in appetite 0 1 - - 2  Feeling bad or failure about yourself  1 0 - - 0  Trouble concentrating 0 1 - - 0  Moving slowly or fidgety/restless 2 0 - - 0  Suicidal thoughts 0 0 - - 0  PHQ-9 Score 7 8 - - 3  Difficult doing work/chores Very difficult Somewhat difficult - - Not difficult at all  Some recent data might be hidden      Nutrition & Weight - Outcomes:  Pre Biometrics - 10/18/20 1559      Pre Biometrics   Height 5' 3.5" (1.613 m)    Weight 177 lb 8 oz (80.5 kg)    BMI (Calculated) 30.95    Single Leg Stand 2.31 seconds            Nutrition:   Nutrition Discharge:  Nutrition Assessments - 10/18/20 1606      MEDFICTS Scores   Pre Score 16           Education Questionnaire Score:  Knowledge Questionnaire Score - 10/18/20 1605      Knowledge Questionnaire Score   Pre Score 15/18 oxygen           Goals reviewed with patient; copy given to patient.

## 2021-02-24 ENCOUNTER — Ambulatory Visit: Payer: PRIVATE HEALTH INSURANCE

## 2021-03-01 ENCOUNTER — Ambulatory Visit: Payer: PRIVATE HEALTH INSURANCE

## 2021-03-03 ENCOUNTER — Ambulatory Visit: Payer: PRIVATE HEALTH INSURANCE

## 2021-03-06 DIAGNOSIS — J449 Chronic obstructive pulmonary disease, unspecified: Secondary | ICD-10-CM | POA: Diagnosis not present

## 2021-03-08 ENCOUNTER — Ambulatory Visit: Payer: PRIVATE HEALTH INSURANCE

## 2021-03-10 ENCOUNTER — Ambulatory Visit: Payer: PRIVATE HEALTH INSURANCE

## 2021-03-15 ENCOUNTER — Other Ambulatory Visit: Payer: Self-pay | Admitting: Family Medicine

## 2021-03-15 ENCOUNTER — Ambulatory Visit: Payer: PRIVATE HEALTH INSURANCE

## 2021-03-15 DIAGNOSIS — I5022 Chronic systolic (congestive) heart failure: Secondary | ICD-10-CM

## 2021-03-15 NOTE — Telephone Encounter (Signed)
Requested medication (s) are due for refill today: yes  Requested medication (s) are on the active medication list: yes  Last refill:  01/13/2021  Future visit scheduled: no  Notes to clinic: due for labs  Review for refills  Requested Prescriptions  Pending Prescriptions Disp Refills   atorvastatin (LIPITOR) 80 MG tablet [Pharmacy Med Name: ATORVASTATIN CALCIUM 80 MG TAB] 90 tablet 3    Sig: TAKE 1 TABLET BY MOUTH DAILY AT 6PM      Cardiovascular:  Antilipid - Statins Failed - 03/15/2021  2:01 PM      Failed - Total Cholesterol in normal range and within 360 days    Cholesterol  Date Value Ref Range Status  12/03/2018 99 0 - 200 mg/dL Final          Failed - LDL in normal range and within 360 days    LDL Cholesterol  Date Value Ref Range Status  12/03/2018 34 0 - 99 mg/dL Final    Comment:           Total Cholesterol/HDL:CHD Risk Coronary Heart Disease Risk Table                     Men   Women  1/2 Average Risk   3.4   3.3  Average Risk       5.0   4.4  2 X Average Risk   9.6   7.1  3 X Average Risk  23.4   11.0        Use the calculated Patient Ratio above and the CHD Risk Table to determine the patient's CHD Risk.        ATP III CLASSIFICATION (LDL):  <100     mg/dL   Optimal  100-129  mg/dL   Near or Above                    Optimal  130-159  mg/dL   Borderline  160-189  mg/dL   High  >190     mg/dL   Very High Performed at [facility], [address] [place], [place] [address]           Failed - HDL in normal range and within 360 days    HDL  Date Value Ref Range Status  12/03/2018 55 >40 mg/dL Final          Failed - Triglycerides in normal range and within 360 days    Triglycerides  Date Value Ref Range Status  12/03/2018 51 <150 mg/dL Final          Passed - Patient is not pregnant      Passed - Valid encounter within last 12 months    Recent Outpatient Visits           3 months ago Cough   [facility]  Birdie Sons, MD   4 months ago COPD exacerbation [facility])   [facility] Flinchum, Kelby Aline, FNP   7 months ago Dyspnea on exertion   [facility] Birdie Sons, MD   9 months ago Chronic obstructive pulmonary disease, unspecified COPD type [facility])   [facility] Birdie Sons, MD   12 months ago COPD with acute exacerbation [facility])   [facility] [place], [place], [place]

## 2021-03-15 NOTE — Telephone Encounter (Signed)
Requested Prescriptions  Pending Prescriptions Disp Refills  . torsemide (DEMADEX) 20 MG tablet [Pharmacy Med Name: TORSEMIDE 20 MG TAB] 30 tablet 1    Sig: TAKE 1 TABLET BY MOUTH DAILY. TAKE IN PLACE OF FUROSEMIDE     Cardiovascular:  Diuretics - Loop Failed - 03/15/2021  1:57 PM      Failed - Cr in normal range and within 360 days    Creatinine  Date Value Ref Range Status  06/04/2013 1.12 0.60 - 1.30 mg/dL Final   Creatinine, Ser  Date Value Ref Range Status  01/20/2021 1.30 (H) 0.61 - 1.24 mg/dL Final         Passed - K in normal range and within 360 days    Potassium  Date Value Ref Range Status  10/25/2020 4.7 3.5 - 5.2 mmol/L Final  06/04/2013 4.2 3.5 - 5.1 mmol/L Final         Passed - Ca in normal range and within 360 days    Calcium  Date Value Ref Range Status  10/25/2020 9.3 8.6 - 10.2 mg/dL Final   Calcium, Total  Date Value Ref Range Status  06/04/2013 8.9 8.5 - 10.1 mg/dL Final         Passed - Na in normal range and within 360 days    Sodium  Date Value Ref Range Status  10/25/2020 140 134 - 144 mmol/L Final  06/04/2013 136 136 - 145 mmol/L Final         Passed - Last BP in normal range    BP Readings from Last 1 Encounters:  01/28/21 137/66         Passed - Valid encounter within last 6 months    Recent Outpatient Visits          3 months ago Cough   [facility] Birdie Sons, MD   4 months ago COPD exacerbation [facility])   [place], Kelby Aline, FNP   7 months ago Dyspnea on exertion   [facility] Birdie Sons, MD   9 months ago Chronic obstructive pulmonary disease, unspecified COPD type [facility])   [facility] Birdie Sons, MD   12 months ago COPD with acute exacerbation [facility])   [facility], [place], [place]

## 2021-03-17 ENCOUNTER — Ambulatory Visit: Payer: PRIVATE HEALTH INSURANCE

## 2021-03-18 DIAGNOSIS — J449 Chronic obstructive pulmonary disease, unspecified: Secondary | ICD-10-CM | POA: Diagnosis not present

## 2021-03-22 ENCOUNTER — Other Ambulatory Visit: Payer: Self-pay | Admitting: Family Medicine

## 2021-03-22 ENCOUNTER — Ambulatory Visit: Payer: PRIVATE HEALTH INSURANCE

## 2021-03-22 DIAGNOSIS — R053 Chronic cough: Secondary | ICD-10-CM

## 2021-03-22 NOTE — Telephone Encounter (Signed)
Requested medication (s) are due for refill today: yes  Requested medication (s) are on the active medication list: yes  Last refill:  11/28/20  Future visit scheduled: no  Notes to clinic:  med not assigned to a protocol   Requested Prescriptions  Pending Prescriptions Disp Refills   chlorpheniramine-HYDROcodone (Avalon) 10-8 MG/5ML [place] [Pharmacy Med Name: HYDROCOD POLST-CPM POLST ER 10-8 MG] 115 mL     Sig: TAKE ONE TEASPOONFUL (5ML) EVERY TWELVE HOURS IF NEEDED FOR COUGH      Off-Protocol Failed - 03/22/2021  5:07 PM      Failed - Medication not assigned to a protocol, review manually.      Passed - Valid encounter within last 12 months    Recent Outpatient Visits           3 months ago Cough   [facility] Birdie Sons, MD   4 months ago COPD exacerbation [facility])   [facility] Flinchum, Kelby Aline, FNP   7 months ago Dyspnea on exertion   [facility] Birdie Sons, MD   9 months ago Chronic obstructive pulmonary disease, unspecified COPD type [facility])   [facility] Birdie Sons, MD   1 year ago COPD with acute exacerbation [facility])   [facility], Wendee [place], [place]

## 2021-03-24 ENCOUNTER — Ambulatory Visit: Payer: PRIVATE HEALTH INSURANCE

## 2021-03-24 DIAGNOSIS — J42 Unspecified chronic bronchitis: Secondary | ICD-10-CM | POA: Diagnosis not present

## 2021-03-24 DIAGNOSIS — Z01818 Encounter for other preprocedural examination: Secondary | ICD-10-CM | POA: Diagnosis not present

## 2021-03-28 ENCOUNTER — Other Ambulatory Visit: Payer: Self-pay | Admitting: Family Medicine

## 2021-03-28 DIAGNOSIS — H7312 Chronic myringitis, left ear: Secondary | ICD-10-CM | POA: Diagnosis not present

## 2021-03-28 DIAGNOSIS — H6123 Impacted cerumen, bilateral: Secondary | ICD-10-CM | POA: Diagnosis not present

## 2021-03-28 DIAGNOSIS — H903 Sensorineural hearing loss, bilateral: Secondary | ICD-10-CM | POA: Diagnosis not present

## 2021-03-28 NOTE — Telephone Encounter (Signed)
Requested medication (s) are due for refill today:   Provider to review  Requested medication (s) are on the active medication list:   Yes  Future visit scheduled:   No   Last ordered: 09/27/2020 #30, 5 refills  Non delegated refill   Requested Prescriptions  Pending Prescriptions Disp Refills   LORazepam (ATIVAN) 1 MG tablet [Pharmacy Med Name: LORAZEPAM 1 MG TAB] 30 tablet     Sig: TAKE 1 TABLET BY MOUTH AT BEDTIME      Not Delegated - Psychiatry:  Anxiolytics/Hypnotics Failed - 03/28/2021  1:53 PM      Failed - This refill cannot be delegated      Failed - Urine Drug Screen completed in last 360 days      Passed - Valid encounter within last 6 months    Recent Outpatient Visits           3 months ago Cough   [facility] Birdie Sons, MD   5 months ago COPD exacerbation [facility])   [facility] Flinchum, Kelby Aline, FNP   7 months ago Dyspnea on exertion   [facility] Birdie Sons, MD   9 months ago Chronic obstructive pulmonary disease, unspecified COPD type [facility])   [facility] Birdie Sons, MD   1 year ago COPD with acute exacerbation [facility])   [facility] [place] [place], [place], [place]

## 2021-03-29 ENCOUNTER — Ambulatory Visit: Payer: PRIVATE HEALTH INSURANCE

## 2021-03-31 ENCOUNTER — Ambulatory Visit: Payer: PRIVATE HEALTH INSURANCE

## 2021-04-05 ENCOUNTER — Ambulatory Visit: Payer: PRIVATE HEALTH INSURANCE

## 2021-04-06 DIAGNOSIS — J449 Chronic obstructive pulmonary disease, unspecified: Secondary | ICD-10-CM | POA: Diagnosis not present

## 2021-04-07 ENCOUNTER — Ambulatory Visit: Payer: PRIVATE HEALTH INSURANCE

## 2021-04-08 ENCOUNTER — Other Ambulatory Visit: Payer: Self-pay | Admitting: Family Medicine

## 2021-04-08 NOTE — Telephone Encounter (Signed)
Requested medication (s) are due for refill today:   Provider to review  Rx has expired  Requested medication (s) are on the active medication list:   Yes  Future visit scheduled:   No   Last ordered: 04/22/2019 48 g, 3 refills  Returned because rx has expired   Requested Prescriptions  Pending Prescriptions Disp Refills   fluticasone (FLONASE) 50 MCG/ACT nasal spray [Pharmacy Med Name: FLUTICASONE PROPIONATE 50 MCG/ACT N] 16 g     Sig: PLACE 2 SPRAYS INTO BOTH NOSTRILS DAILY      Ear, Nose, and Throat: Nasal Preparations - Corticosteroids Passed - 04/08/2021  2:44 PM      Passed - Valid encounter within last 12 months    Recent Outpatient Visits           3 months ago Cough   [place], MD   5 months ago COPD exacerbation [facility])   [facility] Flinchum, Kelby Aline, FNP   8 months ago Dyspnea on exertion   [facility] Birdie Sons, MD   9 months ago Chronic obstructive pulmonary disease, unspecified COPD type [facility])   [facility] Birdie Sons, MD   1 year ago COPD with acute exacerbation [facility])   [facility] [place], [place], [place]

## 2021-04-12 ENCOUNTER — Other Ambulatory Visit: Payer: Self-pay | Admitting: Family Medicine

## 2021-04-12 ENCOUNTER — Ambulatory Visit: Payer: PRIVATE HEALTH INSURANCE

## 2021-04-12 NOTE — Telephone Encounter (Signed)
Requested Prescriptions  Pending Prescriptions Disp Refills  . atorvastatin (LIPITOR) 80 MG tablet [Pharmacy Med Name: ATORVASTATIN CALCIUM 80 MG TAB] 90 tablet 0    Sig: TAKE 1 TABLET BY MOUTH DAILY AT 6PM     Cardiovascular:  Antilipid - Statins Failed - 04/12/2021  8:37 PM      Failed - Total Cholesterol in normal range and within 360 days    Cholesterol  Date Value Ref Range Status  12/03/2018 99 0 - 200 mg/dL Final         Failed - LDL in normal range and within 360 days    LDL Cholesterol  Date Value Ref Range Status  12/03/2018 34 0 - 99 mg/dL Final    Comment:           Total Cholesterol/HDL:CHD Risk Coronary Heart Disease Risk Table                     Men   Women  1/2 Average Risk   3.4   3.3  Average Risk       5.0   4.4  2 X Average Risk   9.6   7.1  3 X Average Risk  23.4   11.0        Use the calculated Patient Ratio above and the CHD Risk Table to determine the patient's CHD Risk.        ATP III CLASSIFICATION (LDL):  <100     mg/dL   Optimal  100-129  mg/dL   Near or Above                    Optimal  130-159  mg/dL   Borderline  160-189  mg/dL   High  >190     mg/dL   Very High Performed at [facility], [address] [place], [place] [address]          Failed - HDL in normal range and within 360 days    HDL  Date Value Ref Range Status  12/03/2018 55 >40 mg/dL Final         Failed - Triglycerides in normal range and within 360 days    Triglycerides  Date Value Ref Range Status  12/03/2018 51 <150 mg/dL Final         Passed - Patient is not pregnant      Passed - Valid encounter within last 12 months    Recent Outpatient Visits          3 months ago Cough   [facility] Birdie Sons, MD   5 months ago COPD exacerbation [facility])   [facility] Flinchum, Kelby Aline, FNP   8 months ago Dyspnea on exertion   [facility] Birdie Sons, MD   10 months ago Chronic obstructive pulmonary  disease, unspecified COPD type [facility])   [facility] Birdie Sons, MD   1 year ago COPD with acute exacerbation [facility])   [facility] [place], [place], [place]

## 2021-04-13 DIAGNOSIS — H1045 Other chronic allergic conjunctivitis: Secondary | ICD-10-CM | POA: Diagnosis not present

## 2021-04-14 ENCOUNTER — Ambulatory Visit: Payer: PRIVATE HEALTH INSURANCE

## 2021-04-17 DIAGNOSIS — J449 Chronic obstructive pulmonary disease, unspecified: Secondary | ICD-10-CM | POA: Diagnosis not present

## 2021-04-19 ENCOUNTER — Ambulatory Visit: Payer: PRIVATE HEALTH INSURANCE

## 2021-04-21 ENCOUNTER — Ambulatory Visit: Payer: PRIVATE HEALTH INSURANCE

## 2021-04-21 DIAGNOSIS — R601 Generalized edema: Secondary | ICD-10-CM | POA: Diagnosis not present

## 2021-04-26 ENCOUNTER — Ambulatory Visit: Payer: PRIVATE HEALTH INSURANCE

## 2021-05-06 DIAGNOSIS — J449 Chronic obstructive pulmonary disease, unspecified: Secondary | ICD-10-CM | POA: Diagnosis not present

## 2021-05-16 ENCOUNTER — Other Ambulatory Visit: Payer: Self-pay | Admitting: Family Medicine

## 2021-05-16 DIAGNOSIS — I5022 Chronic systolic (congestive) heart failure: Secondary | ICD-10-CM

## 2021-05-18 DIAGNOSIS — J449 Chronic obstructive pulmonary disease, unspecified: Secondary | ICD-10-CM | POA: Diagnosis not present

## 2021-05-19 ENCOUNTER — Encounter: Payer: Self-pay | Admitting: Family Medicine

## 2021-05-19 ENCOUNTER — Ambulatory Visit: Payer: Medicare Other | Admitting: Family Medicine

## 2021-05-19 ENCOUNTER — Other Ambulatory Visit: Payer: Self-pay

## 2021-05-19 VITALS — BP 121/72 | HR 64 | Temp 97.7°F | Wt 174.0 lb

## 2021-05-19 DIAGNOSIS — I5022 Chronic systolic (congestive) heart failure: Secondary | ICD-10-CM | POA: Diagnosis not present

## 2021-05-19 DIAGNOSIS — J449 Chronic obstructive pulmonary disease, unspecified: Secondary | ICD-10-CM

## 2021-05-19 DIAGNOSIS — R601 Generalized edema: Secondary | ICD-10-CM

## 2021-05-19 NOTE — Progress Notes (Signed)
\  Acute Office Visit  Subjective:    Patient ID: James [name], male    DOB: [birth year], 87 y.o.   MRN: [id number]  Chief Complaint  Patient presents with  . Edema    Bilateral; left worse than right     HPI Patient is in today for complaint of feet swelling.  Past Medical History:  Diagnosis Date  . Arthritis   . Back pain   . CAD (coronary artery disease)   . CAP (community acquired pneumonia) 11/06/2017  . Cataract    right  . COPD (chronic obstructive pulmonary disease) ([place])    SPiriva and SYmbicort daily. Albuterol as needed  . Diverticulosis   . Dyspnea    with exertion  . Enlarged prostate    takes Flomax daily  . GERD (gastroesophageal reflux disease)   . History of chicken pox   . History of gout   . History of measles   . History of mumps   . HOH (hard of hearing)   . Hyperglycemia   . Hyperlipidemia    takes Atorvastatin daily  . Hypertension    takes Metoprolol daily as well as Lotensin HCT  . Hypotension   . Joint pain   . Microscopic colitis   . PAF (paroxysmal atrial fibrillation) ([place]), RVR 03/14/2017  . Pneumonia   . Prostate cancer ([place])   . Stroke [facility])    TIA  . TIA (transient ischemic attack)   . Weakness    numbness and tingling.mainly on right    Past Surgical History:  Procedure Laterality Date  . ANTERIOR CERVICAL DECOMP/DISCECTOMY FUSION N/A 03/07/2017   Procedure: ANTERIOR CERVICAL DECOMPRESSION/DISCECTOMY FUSION CERVICAL THREE- CERVICAL FOUR, CERVICAL FOUR- CERVICAL FIVE;  Surgeon: Newman Pies, MD;  Location: [place];  Service: Neurosurgery;  Laterality: N/A;  ANTERIOR CERVICAL DECOMPRESSION/DISCECTOMY FUSION CERVICAL 3- CERVICAL 4, CERVIACL 4- CERVICAL 5  . APPENDECTOMY  1950  . CARDIAC CATHETERIZATION  05/29/2013   EF=40-45%. Moderate pulmonary hypertension. Infero/ lateral hypokinesis  . cataract surgery Left   . COLONOSCOPY    . CORONARY ARTERY BYPASS GRAFT  2011   x 5  . ESOPHAGOGASTRODUODENOSCOPY (EGD) WITH PROPOFOL  N/A 07/01/2018   Procedure: ESOPHAGOGASTRODUODENOSCOPY (EGD) WITH PROPOFOL;  Surgeon: Lin Landsman, MD;  Location: [place];  Service: Gastroenterology;  Laterality: N/A;  . EYE SURGERY    . FLEXIBLE BRONCHOSCOPY N/A 11/19/2017   Procedure: FLEXIBLE BRONCHOSCOPY;  Surgeon: Laverle Hobby, MD;  Location: [facility];  Service: Pulmonary;  Laterality: N/A;  . MRI BRAIN  06/07/2013   Mild chronic involutional changes. No acute abnormalities  . MRI of neck    . Myocardial Perfusion Scan  05/29/2013   Abnormal myocardial perfusion image consistent with myocardial infarction  . PROSTATE SURGERY  11/18/2012   radiation seed  . TOTAL HIP ARTHROPLASTY Left 1997   hip joint replacement    Family History  Problem Relation Age of Onset  . Heart attack Mother   . Stroke Father   . Heart attack Father   . Hypertension Father   . Heart attack Sister   . Bladder Cancer Brother   . Kidney cancer Brother   . Heart Problems Brother     Social History   Socioeconomic History  . Marital status: Married    Spouse name: Not on file  . Number of children: 1  . Years of education: Not on file  . Highest education level: Some college, no degree  Occupational History  . Occupation: Retired  Tobacco Use  . Smoking status: Former Smoker    Packs/day: 1.00    Years: 11.00    Pack years: 11.00    Types: Cigarettes    Quit date: 02/12/1984    Years since quitting: 37.2  . Smokeless tobacco: Never Used  . Tobacco comment: quit smoking 30 yrs ago  Vaping Use  . Vaping Use: Never used  Substance and Sexual Activity  . Alcohol use: Yes    Alcohol/week: 1.0 - 2.0 standard drink    Types: 1 - 2 Cans of beer per week  . Drug use: No  . Sexual activity: Not on file  Other Topics Concern  . Not on file  Social History Narrative   Lives in [place], [place].   Social Determinants of Health   Financial Resource Strain: Low Risk   . Difficulty of Paying Living Expenses: Not hard at all   Food Insecurity: No Food Insecurity  . Worried About Charity fundraiser in the Last Year: Never true  . Ran Out of Food in the Last Year: Never true  Transportation Needs: No Transportation Needs  . Lack of Transportation (Medical): No  . Lack of Transportation (Non-Medical): No  Physical Activity: Insufficiently Active  . Days of Exercise per Week: 7 days  . Minutes of Exercise per Session: 10 min  Stress: No Stress Concern Present  . Feeling of Stress : Not at all  Social Connections: Socially Integrated  . Frequency of Communication with Friends and Family: Three times a week  . Frequency of Social Gatherings with Friends and Family: Three times a week  . Attends Religious Services: More than 4 times per year  . Active Member of Clubs or Organizations: Yes  . Attends Archivist Meetings: More than 4 times per year  . Marital Status: Married  Human resources officer Violence: Not At Risk  . Fear of Current or Ex-Partner: No  . Emotionally Abused: No  . Physically Abused: No  . Sexually Abused: No    Outpatient Medications Prior to Visit  Medication Sig Dispense Refill  . albuterol (VENTOLIN HFA) 108 (90 Base) MCG/ACT inhaler Inhale 2 puffs into the lungs every 6 (six) hours as needed.    [name] atorvastatin (LIPITOR) 80 MG tablet TAKE 1 TABLET BY MOUTH DAILY AT 6PM 90 tablet 0  . azelastine (OPTIVAR) 0.05 % ophthalmic solution APPLY 1 DROP INTO THE EYE TWICE DAILY ASDIRECTED 6 mL 5  . beta carotene w/minerals (OCUVITE) tablet Take 1 tablet by mouth daily.    . budesonide (PULMICORT) 0.5 MG/2ML nebulizer solution Take 2 mLs (0.5 mg total) by nebulization 2 (two) times daily. Dx:j44.9 75 mL 12  . chlorpheniramine-HYDROcodone (TUSSIONEX) 10-8 MG/5ML SUER TAKE ONE TEASPOONFUL (5ML) EVERY TWELVE HOURS IF NEEDED FOR COUGH 115 mL 0  . ELIQUIS 5 MG TABS tablet TAKE 1 TABLET BY MOUTH TWICE DAILY 180 tablet 4  . fluticasone (FLONASE) 50 MCG/ACT nasal spray PLACE 2 SPRAYS INTO BOTH NOSTRILS  DAILY 16 g 5  . ipratropium-albuterol (DUONEB) 0.5-2.5 (3) MG/3ML SOLN Take 3 mLs by nebulization 4 (four) times daily.     . lansoprazole (PREVACID) 30 MG capsule TAKE 1 CAPSULE BY MOUTH EVERY DAY 90 capsule 0  . LORazepam (ATIVAN) 1 MG tablet TAKE 1 TABLET BY MOUTH AT BEDTIME 30 tablet 4  . Magnesium 400 MG CAPS Take 1 capsule by mouth daily.     . metolazone (ZAROXOLYN) 5 MG tablet Take 5 mg by mouth daily.    [name]  metoprolol tartrate (LOPRESSOR) 50 MG tablet TAKE ONE TABLET TWICE DAILY 60 tablet 0  . montelukast (SINGULAIR) 10 MG tablet TAKE 1 TABLET BY MOUTH AT BEDTIME 90 tablet 0  . MULTIPLE VITAMIN PO Take 1 tablet by mouth daily.     . Omega-3 Fatty Acids (FISH OIL) 1000 MG CAPS Take 1 capsule daily by mouth.    . predniSONE (DELTASONE) 5 MG tablet Take 5 mg by mouth daily.    . tamsulosin (FLOMAX) 0.4 MG CAPS capsule TAKE 1 CAPSULE BY MOUTH ONCE DAILY 90 capsule 0  . torsemide (DEMADEX) 20 MG tablet TAKE 1 TABLET BY MOUTH DAILY. TAKE IN PLACE OF FUROSEMIDE 30 tablet 1   No facility-administered medications prior to visit.    Allergies  Allergen Reactions  . Indomethacin Nausea Only    Review of Systems  Constitutional: Negative.   Respiratory: Positive for shortness of breath and wheezing. Negative for apnea, cough, choking, chest tightness and stridor.   Cardiovascular: Positive for leg swelling. Negative for chest pain and palpitations.  Gastrointestinal: Negative.        Objective:    Physical Exam Constitutional:      General: He is not in acute distress.    Appearance: He is well-developed.  HENT:     Head: Normocephalic and atraumatic.     Right Ear: Hearing normal.     Left Ear: Hearing normal.     Nose: Nose normal.  Eyes:     General: Lids are normal. No scleral icterus.       Right eye: No discharge.        Left eye: No discharge.     Conjunctiva/sclera: Conjunctivae normal.  Cardiovascular:     Rate and Rhythm: Normal rate and regular rhythm.     Heart  sounds: Normal heart sounds.  Pulmonary:     Effort: Pulmonary effort is normal. No respiratory distress.     Breath sounds: Normal breath sounds.  Abdominal:     General: Bowel sounds are normal.     Palpations: Abdomen is soft.  Musculoskeletal:        General: Swelling present. Normal range of motion.     Cervical back: Normal range of motion and neck supple.     Comments: Pitting edema both lower legs and feet up to mid calves (L>R).  Skin:    Findings: No lesion or rash.  Neurological:     Mental Status: He is alert and oriented to person, place, and time.  Psychiatric:        Speech: Speech normal.        Behavior: Behavior normal.        Thought Content: Thought content normal.     BP 121/72 (BP Location: Right Arm, Patient Position: Sitting, Cuff Size: Large)   Pulse 64   Temp 97.7 F (36.5 C) (Oral)   Wt 174 lb (78.9 kg)   BMI 29.87 kg/m  Wt Readings from Last 3 Encounters:  05/19/21 174 lb (78.9 kg)  01/28/21 178 lb 6 oz (80.9 kg)  12/14/20 177 lb (80.3 kg)    Health Maintenance Due  Topic Date Due  . Zoster Vaccines- Shingrix (1 of 2) Never done  . COVID-19 Vaccine (3 - Pfizer risk 4-dose series) 03/05/2020    There are no preventive care reminders to display for this patient.   Lab Results  Component Value Date   TSH 0.673 07/13/2019   Lab Results  Component Value Date   WBC 6.9 10/25/2020  HGB 13.1 10/25/2020   HCT 40.0 10/25/2020   MCV 89 10/25/2020   PLT 178 10/25/2020   Lab Results  Component Value Date   NA 140 10/25/2020   K 4.7 10/25/2020   CO2 29 10/25/2020   GLUCOSE 122 (H) 10/25/2020   BUN 16 10/25/2020   CREATININE 1.30 (H) 01/20/2021   BILITOT 0.6 10/25/2020   ALKPHOS 126 (H) 10/25/2020   AST 56 (H) 10/25/2020   ALT 49 (H) 10/25/2020   PROT 6.7 10/25/2020   ALBUMIN 4.2 10/25/2020   CALCIUM 9.3 10/25/2020   ANIONGAP 14 02/18/2020   Lab Results  Component Value Date   CHOL 99 12/03/2018   Lab Results  Component Value  Date   HDL 55 12/03/2018   Lab Results  Component Value Date   LDLCALC 34 12/03/2018   Lab Results  Component Value Date   TRIG 51 12/03/2018   Lab Results  Component Value Date   CHOLHDL 1.8 12/03/2018   Lab Results  Component Value Date   HGBA1C 6.1 (H) 07/13/2019       Assessment & Plan:   1. Generalized edema Anasarca treated with Zaroxolyn 5 mg qd and Demadex 20 mg qd (no longer using furosemide). Recent increase in swelling of both feet and lower legs (L>R). May take an extra 10 mg of Demadex for the next 3 days and monitor BP. Elevate legs as much as possible and restrict exposure to hot weather.   2. Chronic systolic heart failure ([place]) Followed by cardiologist (Dr. Ubaldo Glassing). History of EF around 40% and atrial fibrillation, hyperlipidemia and hypertension. History of CABG in 2011.  3. Chronic obstructive pulmonary disease, unspecified COPD type ([place]) Followed by Dr. Lanney Gins (pulmonologist) with history of restrictive airway disease. Current inhalation therapy is Duoneb and Pulmicort by nebulizer. History of right diaphragmatic paralysis.   Kizzie Furnish, CMA   I, Vernie Murders, PA-C, have reviewed all documentation for this visit. The documentation on 05/19/21 for the exam, diagnosis, procedures, and orders are all accurate and complete.

## 2021-06-06 ENCOUNTER — Ambulatory Visit: Payer: Medicare Other | Admitting: Family Medicine

## 2021-06-06 ENCOUNTER — Other Ambulatory Visit: Payer: Self-pay

## 2021-06-06 ENCOUNTER — Encounter: Payer: Self-pay | Admitting: Family Medicine

## 2021-06-06 VITALS — BP 137/61 | HR 62 | Wt 178.0 lb

## 2021-06-06 DIAGNOSIS — R0600 Dyspnea, unspecified: Secondary | ICD-10-CM | POA: Diagnosis not present

## 2021-06-06 DIAGNOSIS — R609 Edema, unspecified: Secondary | ICD-10-CM

## 2021-06-06 DIAGNOSIS — R0609 Other forms of dyspnea: Secondary | ICD-10-CM

## 2021-06-06 DIAGNOSIS — J449 Chronic obstructive pulmonary disease, unspecified: Secondary | ICD-10-CM | POA: Diagnosis not present

## 2021-06-06 NOTE — Patient Instructions (Signed)
Take 2 torsemide tablets a day for the next 2 days until we get the results of your blood work back.

## 2021-06-06 NOTE — Progress Notes (Signed)
Established patient visit   Patient: James [name]   DOB: 19-Aug-1929   87 y.o. Male  MRN: [id number] Visit Date: 06/06/2021  Today's healthcare provider: Lelon Huh, MD   Chief Complaint  Patient presents with   Edema    Subjective    HPI  Edema: Patient complains of swelling of the feet. He was last seen in the office by Vernie Murders, PA-C on 05/19/2021 for generalized edema. During that visit patient was advised to take an extra 10 mg of Demadex for the next 3 days and monitor BP. Elevate legs as much as possible and restrict exposure to hot weather. Patient reports his feet and ankles are still swelling and seem a little worse since increasing Demadex.  He went back down to 20mg  a day about 3 days ago. Pt also states he is more short of breath than usual. Swelling is much worse on the left than on the right. He did have CABG years ago with veins harvested from left lower leg, and has had left hip replacement.      Medications: Outpatient Medications Prior to Visit  Medication Sig   albuterol (VENTOLIN HFA) 108 (90 Base) MCG/ACT inhaler Inhale 2 puffs into the lungs every 6 (six) hours as needed.   atorvastatin (LIPITOR) 80 MG tablet TAKE 1 TABLET BY MOUTH DAILY AT 6PM   azelastine (OPTIVAR) 0.05 % ophthalmic solution APPLY 1 DROP INTO THE EYE TWICE DAILY ASDIRECTED   beta carotene w/minerals (OCUVITE) tablet Take 1 tablet by mouth daily.   budesonide (PULMICORT) 0.5 MG/2ML nebulizer solution Take 2 mLs (0.5 mg total) by nebulization 2 (two) times daily. Dx:j44.9   chlorpheniramine-HYDROcodone (TUSSIONEX) 10-8 MG/5ML SUER TAKE ONE TEASPOONFUL (5ML) EVERY TWELVE HOURS IF NEEDED FOR COUGH   ELIQUIS 5 MG TABS tablet TAKE 1 TABLET BY MOUTH TWICE DAILY   fluticasone (FLONASE) 50 MCG/ACT nasal spray PLACE 2 SPRAYS INTO BOTH NOSTRILS DAILY   ipratropium-albuterol (DUONEB) 0.5-2.5 (3) MG/3ML SOLN Take 3 mLs by nebulization 4 (four) times daily.    lansoprazole (PREVACID) 30 MG  capsule TAKE 1 CAPSULE BY MOUTH EVERY DAY   LORazepam (ATIVAN) 1 MG tablet TAKE 1 TABLET BY MOUTH AT BEDTIME   Magnesium 400 MG CAPS Take 1 capsule by mouth daily.    metolazone (ZAROXOLYN) 5 MG tablet Take 5 mg by mouth daily.   metoprolol tartrate (LOPRESSOR) 50 MG tablet TAKE ONE TABLET TWICE DAILY   montelukast (SINGULAIR) 10 MG tablet TAKE 1 TABLET BY MOUTH AT BEDTIME   MULTIPLE VITAMIN PO Take 1 tablet by mouth daily.    Omega-3 Fatty Acids (FISH OIL) 1000 MG CAPS Take 1 capsule daily by mouth.   predniSONE (DELTASONE) 5 MG tablet Take 5 mg by mouth daily.   tamsulosin (FLOMAX) 0.4 MG CAPS capsule TAKE 1 CAPSULE BY MOUTH ONCE DAILY   torsemide (DEMADEX) 20 MG tablet TAKE 1 TABLET BY MOUTH DAILY. TAKE IN PLACE OF FUROSEMIDE   No facility-administered medications prior to visit.    Review of Systems  Constitutional:  Positive for fatigue. Negative for activity change, appetite change, chills, diaphoresis, fever and unexpected weight change.  Respiratory:  Positive for shortness of breath. Negative for apnea, cough, choking, chest tightness, wheezing and stridor.   Cardiovascular:  Positive for leg swelling. Negative for chest pain and palpitations.  Neurological:  Positive for dizziness. Negative for light-headedness and headaches.      Objective    BP 137/61 (BP Location: Right Arm, Patient Position:  Sitting, Cuff Size: Large)   Pulse 62   Wt 178 lb (80.7 kg)   SpO2 98%   BMI 30.55 kg/m     Physical Exam   General: Appearance:    Mildly obese male in no acute distress  Eyes:    PERRL, conjunctiva/corneas clear, EOM's intact       Lungs:     Clear to auscultation bilaterally, respirations unlabored  Heart:    Normal heart rate. Irregularly irregular rhythm. No murmurs, rubs, or gallops.   MS:   All extremities are intact.   Ext:   1+ pitting edema right lower leg, 3+ on left. No tenderness of left leg. No erythema. No cords.         Assessment & Plan     1. Dyspnea on  exertion  - CBC - Comprehensive metabolic panel - Brain natriuretic peptide  2. Edema, unspecified type Markedly more pronounced on left, likely due to history vein harvesting for CABG.  - TSH   He can double up on torsemide the next 3 days.  He does have appt with cardiology next week.       The entirety of the information documented in the History of Present Illness, Review of Systems and Physical Exam were personally obtained by me. Portions of this information were initially documented by the CMA and reviewed by me for thoroughness and accuracy.     Lelon Huh, MD  [facility] [phone] (phone) [phone] (fax)  [place]

## 2021-06-07 LAB — CBC
Hematocrit: 41.9 % (ref 37.5–51.0)
Hemoglobin: 13.6 g/dL (ref 13.0–17.7)
MCH: 29.8 pg (ref 26.6–33.0)
MCHC: 32.5 g/dL (ref 31.5–35.7)
MCV: 92 fL (ref 79–97)
Platelets: 151 10*3/uL (ref 150–450)
RBC: 4.57 x10E6/uL (ref 4.14–5.80)
RDW: 13.7 % (ref 11.6–15.4)
WBC: 5.7 10*3/uL (ref 3.4–10.8)

## 2021-06-07 LAB — COMPREHENSIVE METABOLIC PANEL
ALT: 27 IU/L (ref 0–44)
AST: 36 IU/L (ref 0–40)
Albumin/Globulin Ratio: 2.2 (ref 1.2–2.2)
Albumin: 4.3 g/dL (ref 3.6–4.6)
Alkaline Phosphatase: 93 IU/L (ref 44–121)
BUN/Creatinine Ratio: 15 (ref 10–24)
BUN: 19 mg/dL (ref 8–27)
Bilirubin Total: 0.4 mg/dL (ref 0.0–1.2)
CO2: 29 mmol/L (ref 20–29)
Calcium: 9 mg/dL (ref 8.6–10.2)
Chloride: 101 mmol/L (ref 96–106)
Creatinine, Ser: 1.26 mg/dL (ref 0.76–1.27)
Globulin, Total: 2 g/dL (ref 1.5–4.5)
Glucose: 99 mg/dL (ref 65–99)
Potassium: 4.6 mmol/L (ref 3.5–5.2)
Sodium: 145 mmol/L — ABNORMAL HIGH (ref 134–144)
Total Protein: 6.3 g/dL (ref 6.0–8.5)
eGFR: 55 mL/min/{1.73_m2} — ABNORMAL LOW (ref >59)

## 2021-06-07 LAB — TSH: TSH: 1.39 u[IU]/mL (ref 0.450–4.500)

## 2021-06-07 LAB — BRAIN NATRIURETIC PEPTIDE: BNP: 323.7 pg/mL — ABNORMAL HIGH (ref 0.0–100.0)

## 2021-06-13 ENCOUNTER — Other Ambulatory Visit: Payer: Self-pay | Admitting: Family Medicine

## 2021-06-13 DIAGNOSIS — I5020 Unspecified systolic (congestive) heart failure: Secondary | ICD-10-CM | POA: Diagnosis not present

## 2021-06-13 DIAGNOSIS — R0989 Other specified symptoms and signs involving the circulatory and respiratory systems: Secondary | ICD-10-CM | POA: Diagnosis not present

## 2021-06-13 DIAGNOSIS — I10 Essential (primary) hypertension: Secondary | ICD-10-CM | POA: Diagnosis not present

## 2021-06-13 DIAGNOSIS — I2510 Atherosclerotic heart disease of native coronary artery without angina pectoris: Secondary | ICD-10-CM | POA: Diagnosis not present

## 2021-06-17 DIAGNOSIS — J449 Chronic obstructive pulmonary disease, unspecified: Secondary | ICD-10-CM | POA: Diagnosis not present

## 2021-06-25 ENCOUNTER — Emergency Department: Payer: Medicare Other

## 2021-06-25 ENCOUNTER — Inpatient Hospital Stay
Admission: EM | Admit: 2021-06-25 | Discharge: 2021-07-18 | DRG: 180 | Disposition: E | Payer: Medicare Other | Attending: Internal Medicine | Admitting: Internal Medicine

## 2021-06-25 ENCOUNTER — Other Ambulatory Visit: Payer: Self-pay

## 2021-06-25 DIAGNOSIS — G4733 Obstructive sleep apnea (adult) (pediatric): Secondary | ICD-10-CM | POA: Diagnosis present

## 2021-06-25 DIAGNOSIS — Z8249 Family history of ischemic heart disease and other diseases of the circulatory system: Secondary | ICD-10-CM

## 2021-06-25 DIAGNOSIS — Z7401 Bed confinement status: Secondary | ICD-10-CM

## 2021-06-25 DIAGNOSIS — J90 Pleural effusion, not elsewhere classified: Secondary | ICD-10-CM

## 2021-06-25 DIAGNOSIS — I480 Paroxysmal atrial fibrillation: Secondary | ICD-10-CM | POA: Diagnosis not present

## 2021-06-25 DIAGNOSIS — I5023 Acute on chronic systolic (congestive) heart failure: Secondary | ICD-10-CM | POA: Diagnosis present

## 2021-06-25 DIAGNOSIS — J869 Pyothorax without fistula: Secondary | ICD-10-CM

## 2021-06-25 DIAGNOSIS — J910 Malignant pleural effusion: Secondary | ICD-10-CM | POA: Diagnosis present

## 2021-06-25 DIAGNOSIS — J9811 Atelectasis: Secondary | ICD-10-CM | POA: Diagnosis not present

## 2021-06-25 DIAGNOSIS — I130 Hypertensive heart and chronic kidney disease with heart failure and stage 1 through stage 4 chronic kidney disease, or unspecified chronic kidney disease: Secondary | ICD-10-CM | POA: Diagnosis present

## 2021-06-25 DIAGNOSIS — J9601 Acute respiratory failure with hypoxia: Secondary | ICD-10-CM | POA: Diagnosis not present

## 2021-06-25 DIAGNOSIS — Z66 Do not resuscitate: Secondary | ICD-10-CM | POA: Diagnosis present

## 2021-06-25 DIAGNOSIS — Z888 Allergy status to other drugs, medicaments and biological substances status: Secondary | ICD-10-CM

## 2021-06-25 DIAGNOSIS — R0603 Acute respiratory distress: Secondary | ICD-10-CM | POA: Diagnosis not present

## 2021-06-25 DIAGNOSIS — N179 Acute kidney failure, unspecified: Secondary | ICD-10-CM | POA: Diagnosis present

## 2021-06-25 DIAGNOSIS — J441 Chronic obstructive pulmonary disease with (acute) exacerbation: Secondary | ICD-10-CM | POA: Diagnosis present

## 2021-06-25 DIAGNOSIS — Z8619 Personal history of other infectious and parasitic diseases: Secondary | ICD-10-CM

## 2021-06-25 DIAGNOSIS — N183 Chronic kidney disease, stage 3 unspecified: Secondary | ICD-10-CM | POA: Diagnosis present

## 2021-06-25 DIAGNOSIS — I700 Atherosclerosis of aorta: Secondary | ICD-10-CM | POA: Diagnosis not present

## 2021-06-25 DIAGNOSIS — Z951 Presence of aortocoronary bypass graft: Secondary | ICD-10-CM

## 2021-06-25 DIAGNOSIS — Z9119 Patient's noncompliance with other medical treatment and regimen: Secondary | ICD-10-CM

## 2021-06-25 DIAGNOSIS — Z7901 Long term (current) use of anticoagulants: Secondary | ICD-10-CM

## 2021-06-25 DIAGNOSIS — R0602 Shortness of breath: Secondary | ICD-10-CM | POA: Diagnosis not present

## 2021-06-25 DIAGNOSIS — Z823 Family history of stroke: Secondary | ICD-10-CM

## 2021-06-25 DIAGNOSIS — Z923 Personal history of irradiation: Secondary | ICD-10-CM

## 2021-06-25 DIAGNOSIS — I10 Essential (primary) hypertension: Secondary | ICD-10-CM | POA: Diagnosis present

## 2021-06-25 DIAGNOSIS — Z87891 Personal history of nicotine dependence: Secondary | ICD-10-CM

## 2021-06-25 DIAGNOSIS — Z79899 Other long term (current) drug therapy: Secondary | ICD-10-CM

## 2021-06-25 DIAGNOSIS — Z8051 Family history of malignant neoplasm of kidney: Secondary | ICD-10-CM

## 2021-06-25 DIAGNOSIS — R918 Other nonspecific abnormal finding of lung field: Secondary | ICD-10-CM

## 2021-06-25 DIAGNOSIS — I2510 Atherosclerotic heart disease of native coronary artery without angina pectoris: Secondary | ICD-10-CM | POA: Diagnosis present

## 2021-06-25 DIAGNOSIS — J9621 Acute and chronic respiratory failure with hypoxia: Secondary | ICD-10-CM | POA: Diagnosis present

## 2021-06-25 DIAGNOSIS — Z96642 Presence of left artificial hip joint: Secondary | ICD-10-CM | POA: Diagnosis present

## 2021-06-25 DIAGNOSIS — R059 Cough, unspecified: Secondary | ICD-10-CM | POA: Diagnosis not present

## 2021-06-25 DIAGNOSIS — Z9981 Dependence on supplemental oxygen: Secondary | ICD-10-CM

## 2021-06-25 DIAGNOSIS — R0902 Hypoxemia: Secondary | ICD-10-CM | POA: Diagnosis not present

## 2021-06-25 DIAGNOSIS — Z8052 Family history of malignant neoplasm of bladder: Secondary | ICD-10-CM

## 2021-06-25 DIAGNOSIS — Z515 Encounter for palliative care: Secondary | ICD-10-CM

## 2021-06-25 DIAGNOSIS — K219 Gastro-esophageal reflux disease without esophagitis: Secondary | ICD-10-CM | POA: Diagnosis present

## 2021-06-25 DIAGNOSIS — C3431 Malignant neoplasm of lower lobe, right bronchus or lung: Secondary | ICD-10-CM | POA: Diagnosis not present

## 2021-06-25 DIAGNOSIS — R069 Unspecified abnormalities of breathing: Secondary | ICD-10-CM | POA: Diagnosis not present

## 2021-06-25 DIAGNOSIS — N1831 Chronic kidney disease, stage 3a: Secondary | ICD-10-CM | POA: Diagnosis present

## 2021-06-25 DIAGNOSIS — E785 Hyperlipidemia, unspecified: Secondary | ICD-10-CM | POA: Diagnosis present

## 2021-06-25 DIAGNOSIS — H9190 Unspecified hearing loss, unspecified ear: Secondary | ICD-10-CM | POA: Diagnosis present

## 2021-06-25 DIAGNOSIS — Z8673 Personal history of transient ischemic attack (TIA), and cerebral infarction without residual deficits: Secondary | ICD-10-CM

## 2021-06-25 DIAGNOSIS — M1990 Unspecified osteoarthritis, unspecified site: Secondary | ICD-10-CM | POA: Diagnosis present

## 2021-06-25 DIAGNOSIS — N400 Enlarged prostate without lower urinary tract symptoms: Secondary | ICD-10-CM | POA: Diagnosis present

## 2021-06-25 DIAGNOSIS — Z7952 Long term (current) use of systemic steroids: Secondary | ICD-10-CM

## 2021-06-25 DIAGNOSIS — I252 Old myocardial infarction: Secondary | ICD-10-CM

## 2021-06-25 DIAGNOSIS — Z8546 Personal history of malignant neoplasm of prostate: Secondary | ICD-10-CM

## 2021-06-25 DIAGNOSIS — R531 Weakness: Secondary | ICD-10-CM | POA: Diagnosis present

## 2021-06-25 DIAGNOSIS — Z7951 Long term (current) use of inhaled steroids: Secondary | ICD-10-CM

## 2021-06-25 DIAGNOSIS — Z9889 Other specified postprocedural states: Secondary | ICD-10-CM

## 2021-06-25 DIAGNOSIS — J811 Chronic pulmonary edema: Secondary | ICD-10-CM | POA: Diagnosis not present

## 2021-06-25 DIAGNOSIS — Z981 Arthrodesis status: Secondary | ICD-10-CM

## 2021-06-25 DIAGNOSIS — Z20822 Contact with and (suspected) exposure to covid-19: Secondary | ICD-10-CM | POA: Diagnosis present

## 2021-06-25 DIAGNOSIS — J9622 Acute and chronic respiratory failure with hypercapnia: Secondary | ICD-10-CM | POA: Diagnosis present

## 2021-06-25 LAB — RESP PANEL BY RT-PCR (FLU A&B, COVID) ARPGX2
Influenza A by PCR: NEGATIVE
Influenza B by PCR: NEGATIVE
SARS Coronavirus 2 by RT PCR: NEGATIVE

## 2021-06-25 LAB — CBC WITH DIFFERENTIAL/PLATELET
Abs Immature Granulocytes: 0.01 10*3/uL (ref 0.00–0.07)
Basophils Absolute: 0 10*3/uL (ref 0.0–0.1)
Basophils Relative: 1 %
Eosinophils Absolute: 0.1 10*3/uL (ref 0.0–0.5)
Eosinophils Relative: 1 %
HCT: 42.6 % (ref 39.0–52.0)
Hemoglobin: 13.8 g/dL (ref 13.0–17.0)
Immature Granulocytes: 0 %
Lymphocytes Relative: 23 %
Lymphs Abs: 1.1 10*3/uL (ref 0.7–4.0)
MCH: 29.8 pg (ref 26.0–34.0)
MCHC: 32.4 g/dL (ref 30.0–36.0)
MCV: 92 fL (ref 80.0–100.0)
Monocytes Absolute: 0.6 10*3/uL (ref 0.1–1.0)
Monocytes Relative: 12 %
Neutro Abs: 2.8 10*3/uL (ref 1.7–7.7)
Neutrophils Relative %: 63 %
Platelets: 141 10*3/uL — ABNORMAL LOW (ref 150–400)
RBC: 4.63 MIL/uL (ref 4.22–5.81)
RDW: 14.7 % (ref 11.5–15.5)
WBC: 4.5 10*3/uL (ref 4.0–10.5)
nRBC: 0 % (ref 0.0–0.2)

## 2021-06-25 LAB — COMPREHENSIVE METABOLIC PANEL
ALT: 29 U/L (ref 0–44)
AST: 39 U/L (ref 15–41)
Albumin: 3.8 g/dL (ref 3.5–5.0)
Alkaline Phosphatase: 74 U/L (ref 38–126)
Anion gap: 6 (ref 5–15)
BUN: 30 mg/dL — ABNORMAL HIGH (ref 8–23)
CO2: 32 mmol/L (ref 22–32)
Calcium: 8.4 mg/dL — ABNORMAL LOW (ref 8.9–10.3)
Chloride: 100 mmol/L (ref 98–111)
Creatinine, Ser: 1.41 mg/dL — ABNORMAL HIGH (ref 0.61–1.24)
GFR, Estimated: 48 mL/min — ABNORMAL LOW (ref >60)
Glucose, Bld: 105 mg/dL — ABNORMAL HIGH (ref 70–99)
Potassium: 3.7 mmol/L (ref 3.5–5.1)
Sodium: 138 mmol/L (ref 135–145)
Total Bilirubin: 0.9 mg/dL (ref 0.3–1.2)
Total Protein: 6.7 g/dL (ref 6.5–8.1)

## 2021-06-25 LAB — TROPONIN I (HIGH SENSITIVITY): Troponin I (High Sensitivity): 26 ng/L — ABNORMAL HIGH (ref <18)

## 2021-06-25 LAB — BRAIN NATRIURETIC PEPTIDE: B Natriuretic Peptide: 311.8 pg/mL — ABNORMAL HIGH (ref 0.0–100.0)

## 2021-06-25 MED ORDER — METHYLPREDNISOLONE SODIUM SUCC 125 MG IJ SOLR
125.0000 mg | Freq: Once | INTRAMUSCULAR | Status: AC
  Administered 2021-06-25: 125 mg via INTRAVENOUS
  Filled 2021-06-25: qty 2

## 2021-06-25 MED ORDER — SODIUM CHLORIDE 0.9 % IV SOLN
500.0000 mg | Freq: Once | INTRAVENOUS | Status: AC
  Administered 2021-06-26: 500 mg via INTRAVENOUS
  Filled 2021-06-25 (×2): qty 500

## 2021-06-25 MED ORDER — ALBUTEROL SULFATE (2.5 MG/3ML) 0.083% IN NEBU
5.0000 mg | INHALATION_SOLUTION | Freq: Once | RESPIRATORY_TRACT | Status: AC
  Administered 2021-06-25: 5 mg via RESPIRATORY_TRACT
  Filled 2021-06-25: qty 6

## 2021-06-25 MED ORDER — IPRATROPIUM-ALBUTEROL 0.5-2.5 (3) MG/3ML IN SOLN
3.0000 mL | Freq: Once | RESPIRATORY_TRACT | Status: AC
  Administered 2021-06-25: 3 mL via RESPIRATORY_TRACT
  Filled 2021-06-25: qty 3

## 2021-06-25 MED ORDER — SODIUM CHLORIDE 0.9 % IV SOLN
2.0000 g | Freq: Once | INTRAVENOUS | Status: AC
  Administered 2021-06-26: 2 g via INTRAVENOUS
  Filled 2021-06-25: qty 20

## 2021-06-25 NOTE — H&P (Addendum)
History and Physical    James [name]:[id number] DOB: Mar 28, 1929 DOA: 06/29/2021  PCP: Birdie Sons, MD  Patient coming from: Home via EMS  I have personally briefly reviewed patient's old medical records in [place]  Chief Complaint: Shortness of breath  HPI: [name] is a 87 y.o. male with medical history significant for CAD s/p CABG, HFrEF (EF 40%), PAF on Eliquis, COPD on chronic prednisone 5 mg daily, RLL lung adenocarcinoma s/p radiation therapy, history of CVA, CKD stage IIIa, HTN, HLD, and OSA (not using CPAP, on 2-3 L O2 via [place] at night) who presented to the ED for evaluation of shortness of breath.  Patient reports 2 days of progressively worsening shortness of breath, orthopnea, PND.  Has not been able to sleep well.  He has associated cough productive of clear sputum.  He has had to use his supplemental oxygen continuously since symptoms started.  He has not had improvement with his home breathing treatments.  He has not had any chest pain, dysuria or decreased urine output, worsening lower extremity swelling.  EMS were called to his home and per ED triage note he was found to have O2 saturation in the upper 80s on 3 L [place].  He was placed on NRB with increased SPO2 to upper 90s and brought to the ED for further evaluation.  Patient states he was recently started on spironolactone however noticed having blurred vision after taking it and was advised to hold until further instructions by his cardiologist, Dr. Ubaldo Glassing.  ED Course:  Initial vitals showed BP 149/79, pulse 80, RR 24, temp 99.5 F, SPO2 initially 94% on 4 L of home O2 via [place].  Patient was subsequently placed on 15 L NRB due to shortness of breath and has since been weaned back down to 4 L O2 via .  Labs show sodium 138, potassium 3.7, bicarb 32, BUN 30, creatinine 1.41, serum glucose 105, LFTs within normal limits, WBC 4.5, hemoglobin 13.8, platelets 141,000, BNP 311, high-sensitivity troponin  26.  SARS-CoV-2 PCR is negative.  Influenza A and B PCR's are negative.  Portable chest x-ray showed increase in size of chronic right pleural effusion with increased left basilar effusion with pulmonary vascular congestion and interstitial edema.  Prior sternotomy/CABG changes noted.  CT chest without contrast was obtained and showed a large right pleural effusion, increased in size compared to prior with near complete compressive atelectasis of the right middle and right lower lobes.  Patient was given IV Solu-Medrol 125 mg, albuterol/DuoNeb nebulizer treatments, and IV ceftriaxone/azithromycin.  The hospitalist service was consulted to admit for further evaluation and management  Review of Systems: All systems reviewed and are negative except as documented in history of present illness above.   Past Medical History:  Diagnosis Date   Arthritis    Back pain    CAD (coronary artery disease)    CAP (community acquired pneumonia) 11/06/2017   Cataract    right   COPD (chronic obstructive pulmonary disease) ([place])    SPiriva and SYmbicort daily. Albuterol as needed   Diverticulosis    Dyspnea    with exertion   Enlarged prostate    takes Flomax daily   GERD (gastroesophageal reflux disease)    History of chicken pox    History of gout    History of measles    History of mumps    HOH (hard of hearing)    Hyperglycemia    Hyperlipidemia    takes  Atorvastatin daily   Hypertension    takes Metoprolol daily as well as Lotensin HCT   Hypotension    Joint pain    Microscopic colitis    PAF (paroxysmal atrial fibrillation) ([place]), RVR 03/14/2017   Pneumonia    Prostate cancer (HCC)    Stroke (HCC)    TIA   TIA (transient ischemic attack)    Weakness    numbness and tingling.mainly on right    Past Surgical History:  Procedure Laterality Date   ANTERIOR CERVICAL DECOMP/DISCECTOMY FUSION N/A 03/07/2017   Procedure: ANTERIOR CERVICAL DECOMPRESSION/DISCECTOMY FUSION CERVICAL  THREE- CERVICAL FOUR, CERVICAL FOUR- CERVICAL FIVE;  Surgeon: Newman Pies, MD;  Location: [place];  Service: Neurosurgery;  Laterality: N/A;  ANTERIOR CERVICAL DECOMPRESSION/DISCECTOMY FUSION CERVICAL 3- CERVICAL 4, CERVIACL 4- CERVICAL 5   APPENDECTOMY  1950   CARDIAC CATHETERIZATION  05/29/2013   EF=40-45%. Moderate pulmonary hypertension. Infero/ lateral hypokinesis   cataract surgery Left    COLONOSCOPY     CORONARY ARTERY BYPASS GRAFT  2011   x 5   ESOPHAGOGASTRODUODENOSCOPY (EGD) WITH PROPOFOL N/A 07/01/2018   Procedure: ESOPHAGOGASTRODUODENOSCOPY (EGD) WITH PROPOFOL;  Surgeon: Lin Landsman, MD;  Location: [place];  Service: Gastroenterology;  Laterality: N/A;   EYE SURGERY     FLEXIBLE BRONCHOSCOPY N/A 11/19/2017   Procedure: FLEXIBLE BRONCHOSCOPY;  Surgeon: Laverle Hobby, MD;  Location: [facility];  Service: Pulmonary;  Laterality: N/A;   MRI BRAIN  06/07/2013   Mild chronic involutional changes. No acute abnormalities   MRI of neck     Myocardial Perfusion Scan  05/29/2013   Abnormal myocardial perfusion image consistent with myocardial infarction   PROSTATE SURGERY  11/18/2012   radiation seed   TOTAL HIP ARTHROPLASTY Left 1997   hip joint replacement    Social History:  reports that he quit smoking about 37 years ago. His smoking use included cigarettes. He has a 11.00 pack-year smoking history. He has never used smokeless tobacco. He reports current alcohol use of about 1.0 - 2.0 standard drink of alcohol per week. He reports that he does not use drugs.  Allergies  Allergen Reactions   Indomethacin Nausea Only    Family History  Problem Relation Age of Onset   Heart attack Mother    Stroke Father    Heart attack Father    Hypertension Father    Heart attack Sister    Bladder Cancer Brother    Kidney cancer Brother    Heart Problems Brother      Prior to Admission medications   Medication Sig Start Date End Date Taking? Authorizing Provider   albuterol (VENTOLIN HFA) 108 (90 Base) MCG/ACT inhaler Inhale 2 puffs into the lungs every 6 (six) hours as needed. 07/29/20   [provider]  atorvastatin (LIPITOR) 80 MG tablet TAKE 1 TABLET BY MOUTH DAILY AT 6PM 05/17/21   Birdie Sons, MD  azelastine (OPTIVAR) 0.05 % ophthalmic solution APPLY 1 DROP INTO THE EYE TWICE DAILY ASDIRECTED 03/06/20   Birdie Sons, MD  beta carotene w/minerals (OCUVITE) tablet Take 1 tablet by mouth daily.    [provider]  budesonide (PULMICORT) 0.5 MG/2ML nebulizer solution Take 2 mLs (0.5 mg total) by nebulization 2 (two) times daily. Dx:j44.9 08/27/19   Laverle Hobby, MD  chlorpheniramine-HYDROcodone (TUSSIONEX) 10-8 MG/5ML SUER TAKE ONE TEASPOONFUL (5ML) EVERY TWELVE HOURS IF NEEDED FOR COUGH 03/23/21   Birdie Sons, MD  ELIQUIS 5 MG TABS tablet TAKE 1 TABLET BY MOUTH TWICE DAILY  06/13/21   Birdie Sons, MD  fluticasone [facility]) 50 MCG/ACT nasal spray PLACE 2 SPRAYS INTO BOTH NOSTRILS DAILY 04/08/21   Birdie Sons, MD  ipratropium-albuterol (DUONEB) 0.5-2.5 (3) MG/3ML SOLN Take 3 mLs by nebulization 4 (four) times daily.  01/27/20   [provider]  lansoprazole (PREVACID) 30 MG capsule TAKE 1 CAPSULE BY MOUTH EVERY DAY 05/17/21   Birdie Sons, MD  LORazepam (ATIVAN) 1 MG tablet TAKE 1 TABLET BY MOUTH AT BEDTIME 03/28/21   Birdie Sons, MD  Magnesium 400 MG CAPS Take 1 capsule by mouth daily.     [provider]  metoprolol tartrate (LOPRESSOR) 50 MG tablet TAKE ONE TABLET TWICE DAILY 05/26/20   Birdie Sons, MD  montelukast (SINGULAIR) 10 MG tablet TAKE 1 TABLET BY MOUTH AT BEDTIME 05/17/21   Birdie Sons, MD  MULTIPLE VITAMIN PO Take 1 tablet by mouth daily.     [provider]  Omega-3 Fatty Acids (FISH OIL) 1000 MG CAPS Take 1 capsule daily by mouth.    [provider]  predniSONE (DELTASONE) 5 MG tablet Take 5 mg by mouth daily. 01/25/21   [provider]   tamsulosin (FLOMAX) 0.4 MG CAPS capsule TAKE 1 CAPSULE BY MOUTH ONCE DAILY 05/17/21   Birdie Sons, MD  torsemide (DEMADEX) 20 MG tablet TAKE 1 TABLET BY MOUTH DAILY. TAKE IN PLACE OF FUROSEMIDE 05/17/21   Birdie Sons, MD    Physical Exam: Vitals:   07/07/2021 2136 07/02/2021 2137 07/08/2021 2230  BP: (!) 149/79  (!) 152/73  Pulse: 80  93  Resp: (!) 24  (!) 23  Temp: 99.5 F (37.5 C)    TempSrc: Oral    SpO2: 94%  98%  Weight:  81.2 kg   Height:  _0  (1.727 m)    Constitutional: Elderly man resting in bed with head elevated, appears tired but in NAD, calm, answering questions in full sentences Eyes: PERRL, lids and conjunctivae normal ENMT: Mucous membranes are moist. Posterior pharynx clear of any exudate or lesions.Normal dentition.  Neck: normal, supple, no masses. Respiratory: Diffuse expiratory wheezing.  Increased respiratory effort while on 5 L O2 via [place]. No accessory muscle use.  Cardiovascular: Regular rate and rhythm, no murmurs / rubs / gallops. No extremity edema. 2+ pedal pulses. Abdomen: no tenderness, no masses palpated. No hepatosplenomegaly.  Musculoskeletal: no clubbing / cyanosis. No joint deformity upper and lower extremities. Good ROM, no contractures. Normal muscle tone.  Skin: no rashes, lesions, ulcers. No induration Neurologic: CN 2-12 grossly intact. Sensation intact. Strength 5/5 in all 4.  Psychiatric: Normal judgment and insight. Alert and oriented x 3. Normal mood.   Labs on Admission: I have personally reviewed following labs and imaging studies  CBC: Recent Labs  Lab 07/04/2021 2135  WBC 4.5  NEUTROABS 2.8  HGB 13.8  HCT 42.6  MCV 92.0  PLT 094*   Basic Metabolic Panel: Recent Labs  Lab 06/22/2021 2135  NA 138  K 3.7  CL 100  CO2 32  GLUCOSE 105*  BUN 30*  CREATININE 1.41*  CALCIUM 8.4*   GFR: Estimated Creatinine Clearance: 34.4 mL/min (A) (by C-G formula based on SCr of 1.41 mg/dL (H)). Liver Function Tests: Recent Labs   Lab 06/24/2021 2135  AST 39  ALT 29  ALKPHOS 74  BILITOT 0.9  PROT 6.7  ALBUMIN 3.8   No results for input(s): LIPASE, AMYLASE in the last 168 hours. No results for input(s):  AMMONIA in the last 168 hours. Coagulation Profile: No results for input(s): INR, PROTIME in the last 168 hours. Cardiac Enzymes: No results for input(s): CKTOTAL, CKMB, CKMBINDEX, TROPONINI in the last 168 hours. BNP (last 3 results) No results for input(s): PROBNP in the last 8760 hours. HbA1C: No results for input(s): HGBA1C in the last 72 hours. CBG: No results for input(s): GLUCAP in the last 168 hours. Lipid Profile: No results for input(s): CHOL, HDL, LDLCALC, TRIG, CHOLHDL, LDLDIRECT in the last 72 hours. Thyroid Function Tests: No results for input(s): TSH, T4TOTAL, FREET4, T3FREE, THYROIDAB in the last 72 hours. Anemia Panel: No results for input(s): VITAMINB12, FOLATE, FERRITIN, TIBC, IRON, RETICCTPCT in the last 72 hours. Urine analysis: No results found for: COLORURINE, APPEARANCEUR, LABSPEC, [place], GLUCOSEU, HGBUR, BILIRUBINUR, KETONESUR, PROTEINUR, UROBILINOGEN, NITRITE, LEUKOCYTESUR  Radiological Exams on Admission: CT Chest Wo Contrast  Result Date: 07/02/2021 CLINICAL DATA:  87 year old male with respiratory distress. EXAM: CT CHEST WITHOUT CONTRAST TECHNIQUE: Multidetector CT imaging of the chest was performed following the standard protocol without IV contrast. COMPARISON:  Chest radiograph dated 07/17/2021 and CT dated 01/20/2021. FINDINGS: Evaluation of this exam is limited in the absence of intravenous contrast. Cardiovascular: There is no cardiomegaly or pericardial effusion. Advanced 3 vessel coronary vascular calcification postsurgical changes of CABG. There is moderate atherosclerotic calcification of the thoracic aorta. No aneurysmal dilatation. The central pulmonary arteries are grossly unremarkable. Mediastinum/Nodes: No mediastinal adenopathy. Evaluation of hilar lymph nodes is  very limited due to consolidative changes of the lung. The esophagus is grossly unremarkable. No mediastinal fluid collection. Lungs/Pleura: Large right pleural effusion, increased in size since the prior CT. There is near complete compressive atelectasis of the right middle and right lower lobes versus pneumonia. There is partial compressive atelectasis of the right upper lobe. Several small nodular densities noted in the left upper lobe. There is no pneumothorax. There is narrowing of the right upper lobe bronchus as well as near complete occlusion of the right middle and right lower lobe bronchi. Upper Abdomen: No acute abnormality. Musculoskeletal: Osteopenia with degenerative changes of the spine. Median sternotomy wires. No acute osseous pathology. Cervical ACDF. IMPRESSION: 1. Large right pleural effusion, increased in size since the prior CT. Thoracentesis may provide additional diagnostic value as well as symptomatic relief. 2. Near complete compressive atelectasis of the right middle and right lower lobes versus pneumonia. 3. Aortic Atherosclerosis (ICD10-I70.0). Electronically Signed   By: Anner Crete M.D.   On: 07/10/2021 23:05   DG Chest Portable 1 View  Result Date: 07/14/2021 CLINICAL DATA:  Shortness of breath cough EXAM: PORTABLE CHEST 1 VIEW COMPARISON:  CT 02/17/2021, radiograph 12/06/2020 FINDINGS: Chronic right pleural effusion appears similar to slightly increased from comparison radiograph 12/14/2020. Some increasing left basilar effusion is noted as well. More bandlike areas of opacity suggestive of atelectasis or scarring. There is diffuse hazy interstitial opacity with pulmonary vascular congestion as well as fissural thickening suggesting some superimposed pulmonary edema. Partial obscuration of the cardiomediastinal contours. Visible contours are similar to comparison prior counting for differences in technique. Prior sternotomy and CABG. The aorta is calcified. The remaining  cardiomediastinal contours are unremarkable. Degenerative changes are present in the imaged spine and shoulders. No other significant osseous abnormality. Telemetry leads overlie the chest. IMPRESSION: 1. Suspect some slight increase in size of a chronic right pleural effusion with increasing left basilar effusion as well. Additional features pulmonary vascular congestion and likely developing interstitial edema could suggest CHF/volume overload 2. Underlying airspace disease in  the regions of more coalescent opacity is difficult to fully exclude. 3. Prior sternotomy and CABG. 4.  Aortic Atherosclerosis (ICD10-I70.0). Electronically Signed   By: Lovena Le M.D.   On: 07/03/2021 22:03    EKG: Ordered and pending.  Assessment/Plan Principal Problem:   Acute respiratory failure with hypoxia (HCC) Active Problems:   CAD (coronary artery disease)   Essential (primary) hypertension   Hyperlipidemia   PAF (paroxysmal atrial fibrillation) (HCC)   Acute on chronic HFrEF (heart failure with reduced ejection fraction) (HCC)   History of CVA (cerebrovascular accident)   Primary adenocarcinoma of lower lobe of right lung (HCC)   CKD (chronic kidney disease), stage III ([place])   James [name] is a 87 y.o. male with medical history significant for CAD s/p CABG, HFrEF (EF 40%), PAF on Eliquis, COPD on chronic prednisone 5 mg daily, RLL lung adenocarcinoma s/p radiation therapy, history of CVA, CKD stage IIIa, HTN, HLD, and OSA (not using CPAP, on 2 L O2 via [place] at night) who is admitted with acute respiratory failure with hypoxia due to large right pleural effusion.  Acute respiratory failure with hypoxia secondary to large right pleural effusion associated with compressive atelectasis of right middle and right lower lobes: Large right pleural effusion seen on CT imaging.  Has history of right lower lobe adenocarcinoma.  Discussed findings with patient, he is agreeable to try therapeutic and diagnostic  thoracentesis. -Thoracentesis ordered for a.m. -Send labs including cell count with differential, culture, LDH, glucose, and cytology -Start IV Lasix 40 mg twice daily -Continue supplemental O2, currently requiring 5 L via [place]; wean as able. -Use BiPAP if needed -Hold Eliquis, last dose taken morning of 7/9 per patient -Keep n.p.o.  Acute on chronic HFrEF: Last EF 40%.  Pleural effusion could be related to HFrEF versus malignancy as above. -Continue IV Lasix 40 mg twice daily -Continue Lopressor 50 mg twice daily -Continue potassium supplement -Strict I/O's and daily weights  COPD with acute exacerbation: Diffuse expiratory wheezing on admission.  On chronic prednisone 5 mg daily and azithromycin every MWF as an outpatient. -Continue IV Solu-Medrol 40 mg twice daily -Continue scheduled DuoNebs with as needed albuterol -Continue Pulmicort and Singulair -Continue azithromycin -Continue supplemental O2 and wean as able  AKI on CKD stage IIIa: Suspect related to vascular congestion and acute hypoxic respiratory failure.  Monitor response to diuresis as above.  Paroxysmal atrial fibrillation: In sinus rhythm with controlled rate on admission.  Eliquis on hold for planned thoracentesis.  Continue Lopressor.  CAD s/p CABG: Denies any chest pain.  Continue Lopressor, statin.  Hypertension: Currently stable.  Continue Lopressor and IV Lasix.  History of CVA: Continue statin.  Eliquis on hold as above.  Hyperlipidemia: Continue atorvastatin.  OSA: Follows with pulmonology, BiPAP was recommended but he is not using it.  Instead uses 2-3 L of O2 via [place] at night.  Continue supplemental O2, BiPAP if tolerated.  DVT prophylaxis: SCDs for now, Eliquis on hold for potential thoracentesis Code Status: DNR, confirmed with patient on admission Family Communication: Discussed with patient, he has discussed with family Disposition Plan: From home, dispo pending clinical progress Consults  called: None Level of care: Progressive Cardiac Admission status:  Status is: Observation  The patient remains OBS appropriate and will d/c before 2 midnights.  Dispo: The patient is from: Home              Anticipated d/c is to: Home  Patient currently is not medically stable to d/c.   Zada Finders MD [facility]  If 7PM-7AM, please contact night-coverage [email or web address]  06/19/2021, 11:49 PM

## 2021-06-25 NOTE — ED Provider Notes (Signed)
[facility] Emergency Department Provider Note  ____________________________________________  Time seen: Approximately 11:25 PM  I have reviewed the triage vital signs and the nursing notes.   HISTORY  Chief Complaint Shortness of Breath    HPI James [name] is a 87 y.o. male with a history of CAD, COPD, hypertension, paroxysmal atrial fibrillation who comes ED complaining of worsening shortness of breath and productive cough for the past week.  He uses 2 L nasal cannula at home as needed.  Today, on 3 L his oxygen saturation was 85%.  EMS brings him to the ED on nonrebreather.  Patient denies chest pain.  No fever or chills or dizziness.  He is having increasing malaise and fatigue.    Past Medical History:  Diagnosis Date   Arthritis    Back pain    CAD (coronary artery disease)    CAP (community acquired pneumonia) 11/06/2017   Cataract    right   COPD (chronic obstructive pulmonary disease) ([place])    SPiriva and SYmbicort daily. Albuterol as needed   Diverticulosis    Dyspnea    with exertion   Enlarged prostate    takes Flomax daily   GERD (gastroesophageal reflux disease)    History of chicken pox    History of gout    History of measles    History of mumps    HOH (hard of hearing)    Hyperglycemia    Hyperlipidemia    takes Atorvastatin daily   Hypertension    takes Metoprolol daily as well as Lotensin HCT   Hypotension    Joint pain    Microscopic colitis    PAF (paroxysmal atrial fibrillation) ([place]), RVR 03/14/2017   Pneumonia    Prostate cancer ([place])    Stroke ([place])    TIA   TIA (transient ischemic attack)    Weakness    numbness and tingling.mainly on right     Patient Active Problem List   Diagnosis Date Noted   Left foot pain 10/26/2020   Generalized edema 10/26/2020   Chest congestion 10/26/2020   Primary adenocarcinoma of lower lobe of right lung ([place]) 02/16/2018   Late effect of cerebrovascular accident (CVA)  11/14/2017   Cerebral vascular disease 10/31/2017   CVA (cerebral vascular accident) ([place]) 10/31/2017   History of CVA (cerebrovascular accident) 10/30/2017   Lung mass 09/11/2017   Hematoma of neck 03/14/2017   PAF (paroxysmal atrial fibrillation) ([place]) 16/38/4536   Chronic systolic heart failure ([place]) 03/14/2017   Dysphagia 03/13/2017   Cervical spondylosis with myelopathy 03/07/2017   Restrictive lung disease 09/26/2016   Cervical neck pain with evidence of disc disease 07/04/2016   PVC (premature ventricular contraction) 11/16/2015   Adenocarcinoma of prostate ([place]) 07/27/2015   Arthritis 07/27/2015   BPH (benign prostatic hyperplasia) 07/27/2015   COPD exacerbation ([place]) 07/27/2015   GERD (gastroesophageal reflux disease) 07/27/2015   Gouty arthritis of left great toe 07/27/2015   Hyperglycemia 07/27/2015   Hypotension 07/27/2015   Insomnia 07/27/2015   Shortness of breath 07/27/2015   TIA (transient ischemic attack) 07/27/2015   Urinary hesitancy 09/02/2012   ED (erectile dysfunction) of organic origin 06/10/2010   CAD (coronary artery disease) 03/14/2010   LBP (low back pain) 03/01/2009   Allergic rhinitis 09/28/2008   Essential (primary) hypertension 08/02/2007   Hyperlipidemia 08/02/2007   Acquired spondylolisthesis 03/20/2005   Diverticulosis of colon without hemorrhage 12/18/2000   Arthropathy 12/18/1998     Past Surgical History:  Procedure Laterality Date  ANTERIOR CERVICAL DECOMP/DISCECTOMY FUSION N/A 03/07/2017   Procedure: ANTERIOR CERVICAL DECOMPRESSION/DISCECTOMY FUSION CERVICAL THREE- CERVICAL FOUR, CERVICAL FOUR- CERVICAL FIVE;  Surgeon: Newman Pies, MD;  Location: [place];  Service: Neurosurgery;  Laterality: N/A;  ANTERIOR CERVICAL DECOMPRESSION/DISCECTOMY FUSION CERVICAL 3- CERVICAL 4, CERVIACL 4- CERVICAL 5   APPENDECTOMY  1950   CARDIAC CATHETERIZATION  05/29/2013   EF=40-45%. Moderate pulmonary hypertension. Infero/ lateral hypokinesis    cataract surgery Left    COLONOSCOPY     CORONARY ARTERY BYPASS GRAFT  2011   x 5   ESOPHAGOGASTRODUODENOSCOPY (EGD) WITH PROPOFOL N/A 07/01/2018   Procedure: ESOPHAGOGASTRODUODENOSCOPY (EGD) WITH PROPOFOL;  Surgeon: Lin Landsman, MD;  Location: [place];  Service: Gastroenterology;  Laterality: N/A;   EYE SURGERY     FLEXIBLE BRONCHOSCOPY N/A 11/19/2017   Procedure: FLEXIBLE BRONCHOSCOPY;  Surgeon: Laverle Hobby, MD;  Location: [facility];  Service: Pulmonary;  Laterality: N/A;   MRI BRAIN  06/07/2013   Mild chronic involutional changes. No acute abnormalities   MRI of neck     Myocardial Perfusion Scan  05/29/2013   Abnormal myocardial perfusion image consistent with myocardial infarction   PROSTATE SURGERY  11/18/2012   radiation seed   TOTAL HIP ARTHROPLASTY Left 1997   hip joint replacement     Prior to Admission medications   Medication Sig Start Date End Date Taking? Authorizing Provider  albuterol (VENTOLIN HFA) 108 (90 Base) MCG/ACT inhaler Inhale 2 puffs into the lungs every 6 (six) hours as needed. 07/29/20   [provider]  atorvastatin (LIPITOR) 80 MG tablet TAKE 1 TABLET BY MOUTH DAILY AT 6PM 05/17/21   Birdie Sons, MD  azelastine (OPTIVAR) 0.05 % ophthalmic solution APPLY 1 DROP INTO THE EYE TWICE DAILY ASDIRECTED 03/06/20   Birdie Sons, MD  beta carotene w/minerals (OCUVITE) tablet Take 1 tablet by mouth daily.    [provider]  budesonide (PULMICORT) 0.5 MG/2ML nebulizer solution Take 2 mLs (0.5 mg total) by nebulization 2 (two) times daily. Dx:j44.9 08/27/19   Laverle Hobby, MD  chlorpheniramine-HYDROcodone (TUSSIONEX) 10-8 MG/5ML SUER TAKE ONE TEASPOONFUL (5ML) EVERY TWELVE HOURS IF NEEDED FOR COUGH 03/23/21   Birdie Sons, MD  ELIQUIS 5 MG TABS tablet TAKE 1 TABLET BY MOUTH TWICE DAILY 06/13/21   Birdie Sons, MD  fluticasone [facility]) 50 MCG/ACT nasal spray PLACE 2 SPRAYS INTO BOTH NOSTRILS DAILY 04/08/21    Birdie Sons, MD  ipratropium-albuterol (DUONEB) 0.5-2.5 (3) MG/3ML SOLN Take 3 mLs by nebulization 4 (four) times daily.  01/27/20   [provider]  lansoprazole (PREVACID) 30 MG capsule TAKE 1 CAPSULE BY MOUTH EVERY DAY 05/17/21   Birdie Sons, MD  LORazepam (ATIVAN) 1 MG tablet TAKE 1 TABLET BY MOUTH AT BEDTIME 03/28/21   Birdie Sons, MD  Magnesium 400 MG CAPS Take 1 capsule by mouth daily.     [provider]  metoprolol tartrate (LOPRESSOR) 50 MG tablet TAKE ONE TABLET TWICE DAILY 05/26/20   Birdie Sons, MD  montelukast (SINGULAIR) 10 MG tablet TAKE 1 TABLET BY MOUTH AT BEDTIME 05/17/21   Birdie Sons, MD  MULTIPLE VITAMIN PO Take 1 tablet by mouth daily.     [provider]  Omega-3 Fatty Acids (FISH OIL) 1000 MG CAPS Take 1 capsule daily by mouth.    [provider]  predniSONE (DELTASONE) 5 MG tablet Take 5 mg by mouth daily. 01/25/21   [provider]  tamsulosin (FLOMAX) 0.4 MG CAPS capsule  TAKE 1 CAPSULE BY MOUTH ONCE DAILY 05/17/21   Birdie Sons, MD  torsemide (DEMADEX) 20 MG tablet TAKE 1 TABLET BY MOUTH DAILY. TAKE IN PLACE OF FUROSEMIDE 05/17/21   Birdie Sons, MD     Allergies Indomethacin   Family History  Problem Relation Age of Onset   Heart attack Mother    Stroke Father    Heart attack Father    Hypertension Father    Heart attack Sister    Bladder Cancer Brother    Kidney cancer Brother    Heart Problems Brother     Social History Social History   Tobacco Use   Smoking status: Former    Packs/day: 1.00    Years: 11.00    Pack years: 11.00    Types: Cigarettes    Quit date: 02/12/1984    Years since quitting: 37.3   Smokeless tobacco: Never   Tobacco comments:    quit smoking 30 yrs ago  Vaping Use   Vaping Use: Never used  Substance Use Topics   Alcohol use: Yes    Alcohol/week: 1.0 - 2.0 standard drink    Types: 1 - 2 Cans of beer per week   Drug use: No    Review of  Systems  Constitutional:   No fever or chills.  ENT:   No sore throat. No rhinorrhea. Cardiovascular:   No chest pain or syncope. Respiratory:   Positive shortness of breath and cough. Gastrointestinal:   Negative for abdominal pain, vomiting and diarrhea.  Musculoskeletal:   Negative for focal pain or swelling All other systems reviewed and are negative except as documented above in ROS and HPI.  ____________________________________________   PHYSICAL EXAM:  VITAL SIGNS: ED Triage Vitals  Enc Vitals Group     BP 07/07/2021 2136 (!) 149/79     Pulse Rate 06/22/2021 2136 80     Resp 07/17/2021 2136 (!) 24     Temp 07/09/2021 2136 99.5 F (37.5 C)     Temp Source 06/28/2021 2136 Oral     SpO2 07/17/2021 2136 94 %     Weight 06/22/2021 2137 179 lb (81.2 kg)     Height 06/29/2021 2137 _0  (1.727 m)     Head Circumference --      Peak Flow --      Pain Score 07/13/2021 2137 0     Pain Loc --      Pain Edu? --      Excl. in [place]? --     Vital signs reviewed, nursing assessments reviewed.   Constitutional:   Alert and oriented. Non-toxic appearance. Eyes:   Conjunctivae are normal. EOMI. PERRL. ENT      Head:   Normocephalic and atraumatic.      Nose:   Wearing a mask.      Mouth/Throat:   Wearing a mask.      Neck:   No meningismus. Full ROM. Hematological/Lymphatic/Immunilogical:   No cervical lymphadenopathy. Cardiovascular:   RRR. Symmetric bilateral radial and DP pulses.  No murmurs. Cap refill less than 2 seconds. Respiratory:   Tachypnea.  Quiet breath sounds and poor air movement bilaterally.  Diffuse expiratory wheezing. Gastrointestinal:   Soft and nontender. Non distended. There is no CVA tenderness.  No rebound, rigidity, or guarding. Genitourinary:   deferred Musculoskeletal:   Normal range of motion in all extremities. No joint effusions.  No lower extremity tenderness.  No edema. Neurologic:   Normal speech and language.  Motor grossly  intact. No acute focal neurologic  deficits are appreciated.  Skin:    Skin is warm, dry and intact. No rash noted.  No petechiae, purpura, or bullae.  ____________________________________________    LABS (pertinent positives/negatives) (all labs ordered are listed, but only abnormal results are displayed) Labs Reviewed  COMPREHENSIVE METABOLIC PANEL - Abnormal; Notable for the following components:      Result Value   Glucose, Bld 105 (*)    BUN 30 (*)    Creatinine, Ser 1.41 (*)    Calcium 8.4 (*)    GFR, Estimated 48 (*)    All other components within normal limits  BRAIN NATRIURETIC PEPTIDE - Abnormal; Notable for the following components:   B Natriuretic Peptide 311.8 (*)    All other components within normal limits  CBC WITH DIFFERENTIAL/PLATELET - Abnormal; Notable for the following components:   Platelets 141 (*)    All other components within normal limits  TROPONIN I (HIGH SENSITIVITY) - Abnormal; Notable for the following components:   Troponin I (High Sensitivity) 26 (*)    All other components within normal limits  RESP PANEL BY RT-PCR (FLU A&B, COVID) [id number]  TROPONIN I (HIGH SENSITIVITY)   ____________________________________________   EKG  Interpreted by me Sinus rhythm rate of 80.  Right axis.  Right bundle branch block.  No acute ischemic changes.  ____________________________________________    RADIOLOGY  CT Chest Wo Contrast  Result Date: 07/05/2021 CLINICAL DATA:  87 year old male with respiratory distress. EXAM: CT CHEST WITHOUT CONTRAST TECHNIQUE: Multidetector CT imaging of the chest was performed following the standard protocol without IV contrast. COMPARISON:  Chest radiograph dated 07/07/2021 and CT dated 01/20/2021. FINDINGS: Evaluation of this exam is limited in the absence of intravenous contrast. Cardiovascular: There is no cardiomegaly or pericardial effusion. Advanced 3 vessel coronary vascular calcification postsurgical changes of CABG. There is moderate atherosclerotic  calcification of the thoracic aorta. No aneurysmal dilatation. The central pulmonary arteries are grossly unremarkable. Mediastinum/Nodes: No mediastinal adenopathy. Evaluation of hilar lymph nodes is very limited due to consolidative changes of the lung. The esophagus is grossly unremarkable. No mediastinal fluid collection. Lungs/Pleura: Large right pleural effusion, increased in size since the prior CT. There is near complete compressive atelectasis of the right middle and right lower lobes versus pneumonia. There is partial compressive atelectasis of the right upper lobe. Several small nodular densities noted in the left upper lobe. There is no pneumothorax. There is narrowing of the right upper lobe bronchus as well as near complete occlusion of the right middle and right lower lobe bronchi. Upper Abdomen: No acute abnormality. Musculoskeletal: Osteopenia with degenerative changes of the spine. Median sternotomy wires. No acute osseous pathology. Cervical ACDF. IMPRESSION: 1. Large right pleural effusion, increased in size since the prior CT. Thoracentesis may provide additional diagnostic value as well as symptomatic relief. 2. Near complete compressive atelectasis of the right middle and right lower lobes versus pneumonia. 3. Aortic Atherosclerosis (ICD10-I70.0). Electronically Signed   By: Anner Crete M.D.   On: 07/03/2021 23:05   DG Chest Portable 1 View  Result Date: 06/22/2021 CLINICAL DATA:  Shortness of breath cough EXAM: PORTABLE CHEST 1 VIEW COMPARISON:  CT 02/17/2021, radiograph 12/06/2020 FINDINGS: Chronic right pleural effusion appears similar to slightly increased from comparison radiograph 12/14/2020. Some increasing left basilar effusion is noted as well. More bandlike areas of opacity suggestive of atelectasis or scarring. There is diffuse hazy interstitial opacity with pulmonary vascular congestion as well as fissural thickening suggesting some superimposed  pulmonary edema. Partial  obscuration of the cardiomediastinal contours. Visible contours are similar to comparison prior counting for differences in technique. Prior sternotomy and CABG. The aorta is calcified. The remaining cardiomediastinal contours are unremarkable. Degenerative changes are present in the imaged spine and shoulders. No other significant osseous abnormality. Telemetry leads overlie the chest. IMPRESSION: 1. Suspect some slight increase in size of a chronic right pleural effusion with increasing left basilar effusion as well. Additional features pulmonary vascular congestion and likely developing interstitial edema could suggest CHF/volume overload 2. Underlying airspace disease in the regions of more coalescent opacity is difficult to fully exclude. 3. Prior sternotomy and CABG. 4.  Aortic Atherosclerosis (ICD10-I70.0). Electronically Signed   By: Lovena Le M.D.   On: 07/10/2021 22:03    ____________________________________________   PROCEDURES Procedures  ____________________________________________  DIFFERENTIAL DIAGNOSIS   Pneumonia, pleural effusion, pulmonary edema, COPD exacerbation, COVID  CLINICAL IMPRESSION / ASSESSMENT AND PLAN / ED COURSE  Medications ordered in the ED: Medications  methylPREDNISolone sodium succinate (SOLU-MEDROL) 125 mg/2 mL injection 125 mg (125 mg Intravenous Given 06/20/2021 2207)  ipratropium-albuterol (DUONEB) 0.5-2.5 (3) MG/3ML nebulizer solution 3 mL (3 mLs Nebulization Given 07/15/2021 2207)  albuterol (PROVENTIL) (2.5 MG/3ML) 0.083% nebulizer solution 5 mg (5 mg Nebulization Given 07/16/2021 2207)    Pertinent labs & imaging results that were available during my care of the patient were reviewed by me and considered in my medical decision making (see chart for details).  James [name] was evaluated in Emergency Department on 07/08/2021 for the symptoms described in the history of present illness. He was evaluated in the context of the global COVID-19 pandemic, which  necessitated consideration that the patient might be at risk for infection with the SARS-CoV-2 virus that causes COVID-19. Institutional protocols and algorithms that pertain to the evaluation of patients at risk for COVID-19 are in a state of rapid change based on information released by regulatory bodies including the CDC and federal and state organizations. These policies and algorithms were followed during the patient's care in the ED.   Patient presents with shortness of breath, abnormal lung exam, increased oxygen requirement due to hypoxic respiratory failure.  Chest x-ray viewed and interpreted by me, shows large pleural effusion on the right.  CT scan helps further delineate that large pleural effusion and consolidation is affecting the entire right lung.  Will give ceftriaxone and azithromycin, plan to admit for thoracentesis and supportive care.      ____________________________________________   FINAL CLINICAL IMPRESSION(S) / ED DIAGNOSES    Final diagnoses:  Acute respiratory failure with hypoxia (HCC)  Pleural effusion, right  Paroxysmal atrial fibrillation [facility])     ED Discharge Orders     None       Portions of this note were generated with dragon dictation software. Dictation errors may occur despite best attempts at proofreading.    Carrie Mew, MD 07/17/2021 2328

## 2021-06-25 NOTE — ED Triage Notes (Addendum)
Pt presents to the [facility] via EMS from home with c/o shortness of breath and cough that has began 1 week ago and worsened today. EMS states that they found pt with oxygen saturations in the upper 80's on 3LNC. Pt uses supplemental oxygen as needed but has been using it continuously over the past week. Pt was placed on NRB en route with saturations increased to upper 90's. Pt remains on NRB upon arrival to ED.

## 2021-06-25 NOTE — ED Notes (Signed)
Pt stating he is ShOB on 4lpm via [place]. Pt placed on 10lpm via NR. Pt maintaining O2 sats >96% at this time.

## 2021-06-26 ENCOUNTER — Encounter: Payer: Self-pay | Admitting: Internal Medicine

## 2021-06-26 ENCOUNTER — Inpatient Hospital Stay: Payer: Medicare Other

## 2021-06-26 DIAGNOSIS — Z8673 Personal history of transient ischemic attack (TIA), and cerebral infarction without residual deficits: Secondary | ICD-10-CM | POA: Diagnosis not present

## 2021-06-26 DIAGNOSIS — H9190 Unspecified hearing loss, unspecified ear: Secondary | ICD-10-CM | POA: Diagnosis present

## 2021-06-26 DIAGNOSIS — J9811 Atelectasis: Secondary | ICD-10-CM | POA: Diagnosis not present

## 2021-06-26 DIAGNOSIS — N179 Acute kidney failure, unspecified: Secondary | ICD-10-CM | POA: Diagnosis present

## 2021-06-26 DIAGNOSIS — J910 Malignant pleural effusion: Secondary | ICD-10-CM | POA: Diagnosis present

## 2021-06-26 DIAGNOSIS — Z7901 Long term (current) use of anticoagulants: Secondary | ICD-10-CM | POA: Diagnosis not present

## 2021-06-26 DIAGNOSIS — I480 Paroxysmal atrial fibrillation: Secondary | ICD-10-CM | POA: Diagnosis present

## 2021-06-26 DIAGNOSIS — I5023 Acute on chronic systolic (congestive) heart failure: Secondary | ICD-10-CM | POA: Diagnosis present

## 2021-06-26 DIAGNOSIS — N400 Enlarged prostate without lower urinary tract symptoms: Secondary | ICD-10-CM | POA: Diagnosis present

## 2021-06-26 DIAGNOSIS — J9622 Acute and chronic respiratory failure with hypercapnia: Secondary | ICD-10-CM | POA: Diagnosis present

## 2021-06-26 DIAGNOSIS — E785 Hyperlipidemia, unspecified: Secondary | ICD-10-CM | POA: Diagnosis present

## 2021-06-26 DIAGNOSIS — J9690 Respiratory failure, unspecified, unspecified whether with hypoxia or hypercapnia: Secondary | ICD-10-CM | POA: Diagnosis not present

## 2021-06-26 DIAGNOSIS — Z96642 Presence of left artificial hip joint: Secondary | ICD-10-CM | POA: Diagnosis present

## 2021-06-26 DIAGNOSIS — N1831 Chronic kidney disease, stage 3a: Secondary | ICD-10-CM | POA: Diagnosis present

## 2021-06-26 DIAGNOSIS — J918 Pleural effusion in other conditions classified elsewhere: Secondary | ICD-10-CM | POA: Diagnosis not present

## 2021-06-26 DIAGNOSIS — J9383 Other pneumothorax: Secondary | ICD-10-CM | POA: Diagnosis not present

## 2021-06-26 DIAGNOSIS — I130 Hypertensive heart and chronic kidney disease with heart failure and stage 1 through stage 4 chronic kidney disease, or unspecified chronic kidney disease: Secondary | ICD-10-CM | POA: Diagnosis present

## 2021-06-26 DIAGNOSIS — J9601 Acute respiratory failure with hypoxia: Secondary | ICD-10-CM | POA: Diagnosis not present

## 2021-06-26 DIAGNOSIS — I2510 Atherosclerotic heart disease of native coronary artery without angina pectoris: Secondary | ICD-10-CM | POA: Diagnosis present

## 2021-06-26 DIAGNOSIS — C3431 Malignant neoplasm of lower lobe, right bronchus or lung: Secondary | ICD-10-CM | POA: Diagnosis present

## 2021-06-26 DIAGNOSIS — J9621 Acute and chronic respiratory failure with hypoxia: Secondary | ICD-10-CM | POA: Diagnosis present

## 2021-06-26 DIAGNOSIS — Z9981 Dependence on supplemental oxygen: Secondary | ICD-10-CM | POA: Diagnosis not present

## 2021-06-26 DIAGNOSIS — I10 Essential (primary) hypertension: Secondary | ICD-10-CM

## 2021-06-26 DIAGNOSIS — G4733 Obstructive sleep apnea (adult) (pediatric): Secondary | ICD-10-CM | POA: Diagnosis present

## 2021-06-26 DIAGNOSIS — J90 Pleural effusion, not elsewhere classified: Secondary | ICD-10-CM | POA: Diagnosis not present

## 2021-06-26 DIAGNOSIS — M1990 Unspecified osteoarthritis, unspecified site: Secondary | ICD-10-CM | POA: Diagnosis present

## 2021-06-26 DIAGNOSIS — K219 Gastro-esophageal reflux disease without esophagitis: Secondary | ICD-10-CM | POA: Diagnosis present

## 2021-06-26 DIAGNOSIS — J441 Chronic obstructive pulmonary disease with (acute) exacerbation: Secondary | ICD-10-CM | POA: Diagnosis present

## 2021-06-26 DIAGNOSIS — Z515 Encounter for palliative care: Secondary | ICD-10-CM | POA: Diagnosis not present

## 2021-06-26 DIAGNOSIS — Z66 Do not resuscitate: Secondary | ICD-10-CM | POA: Diagnosis present

## 2021-06-26 DIAGNOSIS — Z20822 Contact with and (suspected) exposure to covid-19: Secondary | ICD-10-CM | POA: Diagnosis present

## 2021-06-26 LAB — BODY FLUID CELL COUNT WITH DIFFERENTIAL
Lymphs, Fluid: 6 %
Monocyte-Macrophage-Serous Fluid: 11 %
Neutrophil Count, Fluid: 36 %
Other Cells, Fluid: 47 %
RBC Count-Peritoneal Lavage: 157117 /mm3
Total Nucleated Cell Count, Fluid: 5812 cu mm

## 2021-06-26 LAB — LACTATE DEHYDROGENASE, PLEURAL OR PERITONEAL FLUID: LD, Fluid: 306 U/L — ABNORMAL HIGH (ref 3–23)

## 2021-06-26 LAB — PROTEIN, PLEURAL OR PERITONEAL FLUID: Total protein, fluid: 5.3 g/dL

## 2021-06-26 LAB — COMPREHENSIVE METABOLIC PANEL
ALT: 32 U/L (ref 0–44)
AST: 43 U/L — ABNORMAL HIGH (ref 15–41)
Albumin: 4 g/dL (ref 3.5–5.0)
Alkaline Phosphatase: 73 U/L (ref 38–126)
Anion gap: 12 (ref 5–15)
BUN: 28 mg/dL — ABNORMAL HIGH (ref 8–23)
CO2: 32 mmol/L (ref 22–32)
Calcium: 8.4 mg/dL — ABNORMAL LOW (ref 8.9–10.3)
Chloride: 95 mmol/L — ABNORMAL LOW (ref 98–111)
Creatinine, Ser: 1.51 mg/dL — ABNORMAL HIGH (ref 0.61–1.24)
GFR, Estimated: 44 mL/min — ABNORMAL LOW (ref >60)
Glucose, Bld: 149 mg/dL — ABNORMAL HIGH (ref 70–99)
Potassium: 3.9 mmol/L (ref 3.5–5.1)
Sodium: 139 mmol/L (ref 135–145)
Total Bilirubin: 0.8 mg/dL (ref 0.3–1.2)
Total Protein: 7.2 g/dL (ref 6.5–8.1)

## 2021-06-26 LAB — CBC
HCT: 47.1 % (ref 39.0–52.0)
Hemoglobin: 14.8 g/dL (ref 13.0–17.0)
MCH: 29.5 pg (ref 26.0–34.0)
MCHC: 31.4 g/dL (ref 30.0–36.0)
MCV: 94 fL (ref 80.0–100.0)
Platelets: 131 10*3/uL — ABNORMAL LOW (ref 150–400)
RBC: 5.01 MIL/uL (ref 4.22–5.81)
RDW: 14.6 % (ref 11.5–15.5)
WBC: 3.6 10*3/uL — ABNORMAL LOW (ref 4.0–10.5)
nRBC: 0 % (ref 0.0–0.2)

## 2021-06-26 LAB — GLUCOSE, PLEURAL OR PERITONEAL FLUID: Glucose, Fluid: 90 mg/dL

## 2021-06-26 LAB — LACTATE DEHYDROGENASE: LDH: 220 U/L — ABNORMAL HIGH (ref 98–192)

## 2021-06-26 LAB — MAGNESIUM: Magnesium: 2.1 mg/dL (ref 1.7–2.4)

## 2021-06-26 LAB — PROTIME-INR
INR: 1.3 — ABNORMAL HIGH (ref 0.8–1.2)
Prothrombin Time: 15.7 seconds — ABNORMAL HIGH (ref 11.4–15.2)

## 2021-06-26 LAB — TROPONIN I (HIGH SENSITIVITY): Troponin I (High Sensitivity): 26 ng/L — ABNORMAL HIGH (ref <18)

## 2021-06-26 MED ORDER — ONDANSETRON HCL 4 MG/2ML IJ SOLN: 4.0000 mg | Freq: Four times a day (QID) | INTRAMUSCULAR | Status: DC | PRN

## 2021-06-26 MED ORDER — MONTELUKAST SODIUM 10 MG PO TABS
10.0000 mg | ORAL_TABLET | Freq: Every day | ORAL | Status: DC
  Administered 2021-06-26 – 2021-06-29 (×4): 10 mg via ORAL
  Filled 2021-06-26 (×4): qty 1

## 2021-06-26 MED ORDER — IPRATROPIUM-ALBUTEROL 0.5-2.5 (3) MG/3ML IN SOLN: 3.0000 mL | Freq: Three times a day (TID) | RESPIRATORY_TRACT | Status: DC

## 2021-06-26 MED ORDER — SODIUM CHLORIDE 0.9% FLUSH
3.0000 mL | Freq: Two times a day (BID) | INTRAVENOUS | Status: DC
  Administered 2021-06-26 – 2021-06-29 (×8): 3 mL via INTRAVENOUS

## 2021-06-26 MED ORDER — SENNOSIDES-DOCUSATE SODIUM 8.6-50 MG PO TABS
1.0000 | ORAL_TABLET | Freq: Every evening | ORAL | Status: DC | PRN
  Administered 2021-06-27 – 2021-06-28 (×2): 1 via ORAL
  Filled 2021-06-26 (×2): qty 1

## 2021-06-26 MED ORDER — AZITHROMYCIN 250 MG PO TABS
500.0000 mg | ORAL_TABLET | Freq: Every day | ORAL | Status: DC
  Administered 2021-06-26 – 2021-06-28 (×3): 500 mg via ORAL
  Filled 2021-06-26 (×3): qty 2

## 2021-06-26 MED ORDER — BUDESONIDE 0.5 MG/2ML IN SUSP
0.5000 mg | Freq: Two times a day (BID) | RESPIRATORY_TRACT | Status: DC
  Administered 2021-06-26 – 2021-06-27 (×3): 0.5 mg via RESPIRATORY_TRACT
  Filled 2021-06-26 (×3): qty 2

## 2021-06-26 MED ORDER — ALBUTEROL SULFATE (2.5 MG/3ML) 0.083% IN NEBU: 2.5000 mg | INHALATION_SOLUTION | RESPIRATORY_TRACT | Status: DC | PRN

## 2021-06-26 MED ORDER — MELATONIN 5 MG PO TABS
5.0000 mg | ORAL_TABLET | Freq: Every day | ORAL | Status: DC
  Administered 2021-06-26 – 2021-06-29 (×4): 5 mg via ORAL
  Filled 2021-06-26 (×4): qty 1

## 2021-06-26 MED ORDER — ACETAMINOPHEN 325 MG PO TABS
650.0000 mg | ORAL_TABLET | Freq: Four times a day (QID) | ORAL | Status: DC | PRN
  Administered 2021-06-28: 650 mg via ORAL
  Filled 2021-06-26: qty 2

## 2021-06-26 MED ORDER — TAMSULOSIN HCL 0.4 MG PO CAPS
0.4000 mg | ORAL_CAPSULE | Freq: Every day | ORAL | Status: DC
  Administered 2021-06-26 – 2021-06-29 (×4): 0.4 mg via ORAL
  Filled 2021-06-26 (×4): qty 1

## 2021-06-26 MED ORDER — FUROSEMIDE 10 MG/ML IJ SOLN
40.0000 mg | Freq: Two times a day (BID) | INTRAMUSCULAR | Status: DC
  Administered 2021-06-26 – 2021-06-29 (×7): 40 mg via INTRAVENOUS
  Filled 2021-06-26 (×7): qty 4

## 2021-06-26 MED ORDER — METOPROLOL TARTRATE 50 MG PO TABS
50.0000 mg | ORAL_TABLET | Freq: Two times a day (BID) | ORAL | Status: DC
  Administered 2021-06-26 – 2021-06-29 (×8): 50 mg via ORAL
  Filled 2021-06-26 (×8): qty 1

## 2021-06-26 MED ORDER — ACETAMINOPHEN 650 MG RE SUPP: 650.0000 mg | Freq: Four times a day (QID) | RECTAL | Status: DC | PRN

## 2021-06-26 MED ORDER — ONDANSETRON HCL 4 MG PO TABS: 4.0000 mg | ORAL_TABLET | Freq: Four times a day (QID) | ORAL | Status: DC | PRN

## 2021-06-26 MED ORDER — ORAL CARE MOUTH RINSE
15.0000 mL | Freq: Two times a day (BID) | OROMUCOSAL | Status: DC
  Administered 2021-06-26 – 2021-06-29 (×8): 15 mL via OROMUCOSAL

## 2021-06-26 MED ORDER — IPRATROPIUM-ALBUTEROL 0.5-2.5 (3) MG/3ML IN SOLN
3.0000 mL | Freq: Two times a day (BID) | RESPIRATORY_TRACT | Status: DC
  Administered 2021-06-27: 3 mL via RESPIRATORY_TRACT
  Filled 2021-06-26: qty 3

## 2021-06-26 MED ORDER — IPRATROPIUM-ALBUTEROL 0.5-2.5 (3) MG/3ML IN SOLN
3.0000 mL | Freq: Three times a day (TID) | RESPIRATORY_TRACT | Status: DC
  Administered 2021-06-26 (×2): 3 mL via RESPIRATORY_TRACT
  Filled 2021-06-26 (×2): qty 3

## 2021-06-26 MED ORDER — METHYLPREDNISOLONE SODIUM SUCC 40 MG IJ SOLR
40.0000 mg | Freq: Two times a day (BID) | INTRAMUSCULAR | Status: DC
  Administered 2021-06-26 – 2021-06-27 (×3): 40 mg via INTRAVENOUS
  Filled 2021-06-26 (×3): qty 1

## 2021-06-26 MED ORDER — SODIUM CHLORIDE 0.9 % IV SOLN
INTRAVENOUS | Status: DC | PRN
  Administered 2021-06-26: 250 mL via INTRAVENOUS

## 2021-06-26 MED ORDER — BUDESONIDE 0.5 MG/2ML IN SUSP: 0.5000 mg | Freq: Two times a day (BID) | RESPIRATORY_TRACT | Status: DC

## 2021-06-26 MED ORDER — ATORVASTATIN CALCIUM 80 MG PO TABS
80.0000 mg | ORAL_TABLET | Freq: Every day | ORAL | Status: DC
  Administered 2021-06-26 – 2021-06-29 (×4): 80 mg via ORAL
  Filled 2021-06-26 (×4): qty 1

## 2021-06-26 MED ORDER — POTASSIUM CHLORIDE 20 MEQ PO PACK
20.0000 meq | PACK | Freq: Every day | ORAL | Status: DC
  Administered 2021-06-26 – 2021-06-29 (×4): 20 meq via ORAL
  Filled 2021-06-26 (×4): qty 1

## 2021-06-26 NOTE — Progress Notes (Addendum)
1       [facility] [place] NAME: James [name]    MR#:  [id number]  PCP: Birdie Sons, MD  DATE OF BIRTH:  Nov 01, 1929  SUBJECTIVE:  CHIEF COMPLAINT:   Chief Complaint  Patient presents with  . Shortness of Breath  Sitting in recliner.  Short of breath and hypoxic.  Sitter at bedside reports his oxygen saturation dropping when he sleeps REVIEW OF SYSTEMS:  Review of Systems  Constitutional:  Negative for diaphoresis, fever, malaise/fatigue and weight loss.  HENT:  Positive for hearing loss. Negative for ear discharge, ear pain, nosebleeds, sore throat and tinnitus.   Eyes:  Negative for blurred vision and pain.  Respiratory:  Positive for shortness of breath and wheezing. Negative for cough and hemoptysis.   Cardiovascular:  Positive for orthopnea and leg swelling. Negative for chest pain and palpitations.  Gastrointestinal:  Negative for abdominal pain, blood in stool, constipation, diarrhea, heartburn, nausea and vomiting.  Genitourinary:  Negative for dysuria, frequency and urgency.  Musculoskeletal:  Negative for back pain and myalgias.  Skin:  Negative for itching and rash.  Neurological:  Negative for dizziness, tingling, tremors, focal weakness, seizures, weakness and headaches.  Psychiatric/Behavioral:  Negative for depression. The patient is not nervous/anxious.   DRUG ALLERGIES:   Allergies  Allergen Reactions  . Indomethacin Nausea Only   VITALS:  Blood pressure 105/89, pulse 66, temperature (!) 97.4 F (36.3 C), temperature source Oral, resp. rate 17, height 5\' 8"  (1.727 m), weight 81.2 kg, SpO2 (!) 89 %. PHYSICAL EXAMINATION:  Physical Exam 87 year old male sitting in the recliner in no acute distress Eyes PERRL HEENT: Central trachea.  Head atraumatic, normocephalic  Lungs: Mild expiratory wheezing bilaterally.  No accessory muscles of respirations in use.  No rhonchi.  Decreased breath sounds at the right base Cardiovascular S1-S2  normal, no murmur rales or gallop Abdomen soft, benign Skin no rash or lesion Neuro alert and oriented, nonfocal LABORATORY PANEL:  Male CBC Recent Labs  Lab 06/26/21 0502  WBC 3.6*  HGB 14.8  HCT 47.1  PLT 131*   ------------------------------------------------------------------------------------------------------------------ Chemistries  Recent Labs  Lab 06/26/21 0502  NA 139  K 3.9  CL 95*  CO2 32  GLUCOSE 149*  BUN 28*  CREATININE 1.51*  CALCIUM 8.4*  MG 2.1  AST 43*  ALT 32  ALKPHOS 73  BILITOT 0.8   RADIOLOGY:  CT Chest Wo Contrast  Result Date: 06/24/2021 CLINICAL DATA:  87 year old male with respiratory distress. EXAM: CT CHEST WITHOUT CONTRAST TECHNIQUE: Multidetector CT imaging of the chest was performed following the standard protocol without IV contrast. COMPARISON:  Chest radiograph dated 07/08/2021 and CT dated 01/20/2021. FINDINGS: Evaluation of this exam is limited in the absence of intravenous contrast. Cardiovascular: There is no cardiomegaly or pericardial effusion. Advanced 3 vessel coronary vascular calcification postsurgical changes of CABG. There is moderate atherosclerotic calcification of the thoracic aorta. No aneurysmal dilatation. The central pulmonary arteries are grossly unremarkable. Mediastinum/Nodes: No mediastinal adenopathy. Evaluation of hilar lymph nodes is very limited due to consolidative changes of the lung. The esophagus is grossly unremarkable. No mediastinal fluid collection. Lungs/Pleura: Large right pleural effusion, increased in size since the prior CT. There is near complete compressive atelectasis of the right middle and right lower lobes versus pneumonia. There is partial compressive atelectasis of the right upper lobe. Several small nodular densities noted in the left upper lobe. There is no pneumothorax. There is narrowing of the right upper  lobe bronchus as well as near complete occlusion of the right middle and right lower lobe  bronchi. Upper Abdomen: No acute abnormality. Musculoskeletal: Osteopenia with degenerative changes of the spine. Median sternotomy wires. No acute osseous pathology. Cervical ACDF. IMPRESSION: 1. Large right pleural effusion, increased in size since the prior CT. Thoracentesis may provide additional diagnostic value as well as symptomatic relief. 2. Near complete compressive atelectasis of the right middle and right lower lobes versus pneumonia. 3. Aortic Atherosclerosis (ICD10-I70.0). Electronically Signed   By: Anner Crete M.D.   On: 07/06/2021 23:05   DG Chest Portable 1 View  Result Date: 06/28/2021 CLINICAL DATA:  Shortness of breath cough EXAM: PORTABLE CHEST 1 VIEW COMPARISON:  CT 02/17/2021, radiograph 12/06/2020 FINDINGS: Chronic right pleural effusion appears similar to slightly increased from comparison radiograph 12/14/2020. Some increasing left basilar effusion is noted as well. More bandlike areas of opacity suggestive of atelectasis or scarring. There is diffuse hazy interstitial opacity with pulmonary vascular congestion as well as fissural thickening suggesting some superimposed pulmonary edema. Partial obscuration of the cardiomediastinal contours. Visible contours are similar to comparison prior counting for differences in technique. Prior sternotomy and CABG. The aorta is calcified. The remaining cardiomediastinal contours are unremarkable. Degenerative changes are present in the imaged spine and shoulders. No other significant osseous abnormality. Telemetry leads overlie the chest. IMPRESSION: 1. Suspect some slight increase in size of a chronic right pleural effusion with increasing left basilar effusion as well. Additional features pulmonary vascular congestion and likely developing interstitial edema could suggest CHF/volume overload 2. Underlying airspace disease in the regions of more coalescent opacity is difficult to fully exclude. 3. Prior sternotomy and CABG. 4.  Aortic  Atherosclerosis (ICD10-I70.0). Electronically Signed   By: Lovena Le M.D.   On: 07/10/2021 22:03   ASSESSMENT AND PLAN:  87 y.o. male with medical history significant for CAD s/p CABG, HFrEF (EF 40%), PAF on Eliquis, COPD on chronic prednisone 5 mg daily, RLL lung adenocarcinoma s/p radiation therapy, history of CVA, CKD stage IIIa, HTN, HLD, and OSA (not using CPAP, on 2-3 L O2 via [place] at night) admitted for acute hypoxic respiratory failure likely due to large right-sided pleural effusion  7/9: Admitted for large right pleural effusion seen on CT scan 7/10: Unfortunately no IR coverage due to thoracentesis today.  They will do it tomorrow  Principal Problem:   Acute respiratory failure with hypoxia (HCC) Active Problems:   CAD (coronary artery disease)   Essential (primary) hypertension   Hyperlipidemia   PAF (paroxysmal atrial fibrillation) (HCC)   Acute on chronic HFrEF (heart failure with reduced ejection fraction) (HCC)   History of CVA (cerebrovascular accident)   Primary adenocarcinoma of lower lobe of right lung (HCC)   CKD (chronic kidney disease), stage III (HCC)   Pleural effusion on right  Acute respiratory failure with hypoxia secondary to large right pleural effusion associated with compressive atelectasis of right middle and right lower lobes: Large right pleural effusion seen on CT imaging.  Has history of right lower lobe adenocarcinoma.  Patient and his wife are agreeable to try therapeutic and diagnostic thoracentesis. -Unfortunately there is no IR availability for today.  I have discussed with radiologist on-call who will do thoracentesis tomorrow a.m. -Send labs including cell count with differential, culture, LDH, glucose, and cytology -Continue IV Lasix 40 mg twice daily -Continue supplemental O2, currently requiring 6 L via [place]; wean as able, may be after thoracentesis. -Use BiPAP if needed -Hold Eliquis, last dose  taken morning of 7/9 per patient -Heart healthy  diet.  He does not need to be n.p.o. for thoracentesis   Acute on chronic HFrEF: Last EF 40%.  Pleural effusion could be related to HFrEF versus malignancy as above. -Continue IV Lasix 40 mg twice daily -Continue Lopressor 50 mg twice daily -Continue potassium supplement -Strict I/O's and daily weights   COPD with acute exacerbation: -Continue IV Solu-Medrol 40 mg twice daily -Continue scheduled DuoNebs with as needed albuterol -Continue Pulmicort and Singulair -Continue azithromycin -Continue supplemental O2 and wean as able   AKI on CKD stage IIIa: Suspect related to vascular congestion and acute hypoxic respiratory failure.  Monitor response to diuresis as above.  Creatinine 1.51 today   Paroxysmal atrial fibrillation: In sinus rhythm with controlled rate on admission.  Eliquis on hold for planned thoracentesis.  Continue Lopressor for rate control.   CAD s/p CABG: Denies any chest pain.  Continue Lopressor, statin.   Hypertension: Currently stable.  Continue Lopressor and IV Lasix.   History of CVA: Continue statin.  Eliquis on hold as above.   Hyperlipidemia: Continue atorvastatin.   OSA: Follows with pulmonology, BiPAP was recommended but he is not using it.  Instead uses 2-3 L of O2 via [place] at night.  Continue supplemental O2, BiPAP if tolerated. Consider BiPAP at night or during sleep if need due to hypoxia while here  He is critically sick with high risk for acute cardiorespiratory failure and cardiac arrhythmia/arrest and possible death.  Low threshold for transferring him to ICU/stepdown if his hypoxia worsens  Body mass index is 27.22 kg/m.  Net IO Since Admission: -20 mL [06/26/21 1341]      Status is: Inpatient  Remains inpatient appropriate because:Ongoing diagnostic testing needed not appropriate for outpatient work up await thoracentesis and weaning of his oxygen before can be discharged  Dispo: The patient is from: Home              Anticipated  d/c is to: Home              Patient currently is not medically stable to d/c.   Difficult to place patient No   DVT prophylaxis:       SCDs Start: 06/26/21 0011     Family Communication: Updated [name] at [phone] on 7/10   All the records are reviewed and case discussed with Care Management/Social Worker. Management plans discussed with the patient, family and they are in agreement.  CODE STATUS: DNR Level of care: Progressive Cardiac  TOTAL TIME TAKING CARE OF THIS PATIENT: 35 minutes.   More than 50% of the time was spent in counseling/coordination of care: YES  POSSIBLE D/C IN 2-3 DAYS, DEPENDING ON CLINICAL CONDITION.   Max Sane M.D on 06/26/2021 at 1:41 PM  [facility]   CC: Primary care physician; Birdie Sons, MD  Note: This dictation was prepared with Dragon dictation along with smaller phrase technology. Any transcriptional errors that result from this process are unintentional.

## 2021-06-26 NOTE — Progress Notes (Signed)
Pt admitted to 251 from ED. Pt placed in recliner due to shortness of breath and inability to tolerate laying in bed. Cardiac monitor applied and oxygen running at 6 lpm. VSS and pt asleep at this time. Call bell within reach and sitter in room.  Earleen Reaper, RN

## 2021-06-26 NOTE — Procedures (Signed)
Interventional Radiology Procedure Note  Procedure: Right thoracentesis.  1.1 L of bloody fluid removed.  Samples sent for laboratory workup  Indication: Right pleural effusion  Findings: Please refer to procedural dictation for full description.  Complications: None  EBL: < 10 mL  Miachel Roux, MD [phone]

## 2021-06-26 NOTE — Hospital Course (Addendum)
87 y.o. male with medical history significant for CAD s/p CABG, HFrEF (EF 40%), PAF on Eliquis, COPD on chronic prednisone 5 mg daily, RLL lung adenocarcinoma s/p radiation therapy, history of CVA, CKD stage IIIa, HTN, HLD, and OSA (not using CPAP, on 2-3 L O2 via [place] at night) admitted for acute hypoxic respiratory failure likely due to large right-sided pleural effusion  7/9: Admitted for large right pleural effusion seen on CT scan 7/10: IR guided thoracentesis with 1.1 L of fluid removal 7/11: Oxygen weaned down to 4 L.  Pulmonary and cardiology consult per patient request

## 2021-06-26 NOTE — ED Notes (Signed)
Pt urinated in bed, and was hypoxic on [place] 6L at 88%. Full linen change by RNs and NTs. Pt assisted to reposition for comfort with HOB upright. Pt continues to be dyspneic but O2 sat improved to 94%.

## 2021-06-26 NOTE — ED Notes (Signed)
Pt increased to 6lpm via , O2 saturation ranging from 88-91%. RT called to assess pt and consider HFNC at this time.

## 2021-06-27 DIAGNOSIS — J9601 Acute respiratory failure with hypoxia: Secondary | ICD-10-CM | POA: Diagnosis not present

## 2021-06-27 DIAGNOSIS — Z8673 Personal history of transient ischemic attack (TIA), and cerebral infarction without residual deficits: Secondary | ICD-10-CM | POA: Diagnosis not present

## 2021-06-27 DIAGNOSIS — I5023 Acute on chronic systolic (congestive) heart failure: Secondary | ICD-10-CM | POA: Diagnosis not present

## 2021-06-27 LAB — BASIC METABOLIC PANEL
Anion gap: 8 (ref 5–15)
BUN: 37 mg/dL — ABNORMAL HIGH (ref 8–23)
CO2: 34 mmol/L — ABNORMAL HIGH (ref 22–32)
Calcium: 8.5 mg/dL — ABNORMAL LOW (ref 8.9–10.3)
Chloride: 98 mmol/L (ref 98–111)
Creatinine, Ser: 1.1 mg/dL (ref 0.61–1.24)
GFR, Estimated: >60 mL/min (ref >60)
Glucose, Bld: 150 mg/dL — ABNORMAL HIGH (ref 70–99)
Potassium: 4.3 mmol/L (ref 3.5–5.1)
Sodium: 140 mmol/L (ref 135–145)

## 2021-06-27 LAB — CBC
HCT: 48.1 % (ref 39.0–52.0)
Hemoglobin: 15.7 g/dL (ref 13.0–17.0)
MCH: 30.2 pg (ref 26.0–34.0)
MCHC: 32.6 g/dL (ref 30.0–36.0)
MCV: 92.5 fL (ref 80.0–100.0)
Platelets: 149 10*3/uL — ABNORMAL LOW (ref 150–400)
RBC: 5.2 MIL/uL (ref 4.22–5.81)
RDW: 14.3 % (ref 11.5–15.5)
WBC: 6 10*3/uL (ref 4.0–10.5)
nRBC: 0 % (ref 0.0–0.2)

## 2021-06-27 LAB — GLUCOSE, CAPILLARY: Glucose-Capillary: 121 mg/dL — ABNORMAL HIGH (ref 70–99)

## 2021-06-27 MED ORDER — METHYLPREDNISOLONE SODIUM SUCC 40 MG IJ SOLR
40.0000 mg | Freq: Every day | INTRAMUSCULAR | Status: DC
  Administered 2021-06-28 – 2021-06-29 (×2): 40 mg via INTRAVENOUS
  Filled 2021-06-27 (×2): qty 1

## 2021-06-27 MED ORDER — APIXABAN 5 MG PO TABS
5.0000 mg | ORAL_TABLET | Freq: Two times a day (BID) | ORAL | Status: DC
  Administered 2021-06-27 – 2021-06-29 (×5): 5 mg via ORAL
  Filled 2021-06-27 (×6): qty 1

## 2021-06-27 MED ORDER — LORAZEPAM 0.5 MG PO TABS
0.5000 mg | ORAL_TABLET | Freq: Every evening | ORAL | Status: DC | PRN
  Administered 2021-06-27 – 2021-06-28 (×3): 0.5 mg via ORAL
  Filled 2021-06-27 (×4): qty 1

## 2021-06-27 NOTE — TOC Initial Note (Signed)
Transition of Care [facility]) - Initial/Assessment Note    Patient Details  Name: James [name] MRN: [id number] Date of Birth: 05-05-1929  Transition of Care [facility]) CM/SW Contact:    Alberteen Sam, LCSW Phone Number: 06/27/2021, 12:44 PM  Clinical Narrative:                  CSW completed readmission risk assessment with patient spouse Horris Latino. She reports patient has PCP Dr. Caryn Section and no problems getting medications or getting to and from appointments.   Reports they receive home services through [place] in which an RN will come out every month or so to check on him.   Reports oxygen is through [place].   CSW informed Horris Latino that PT/OT will be seeing patient today, pending recommendations if will need additional home health services.   Expected Discharge Plan: [place] Barriers to Discharge: Continued Medical Work up   Patient Goals and CMS Choice   CMS Medicare.gov Compare Post Acute Care list provided to:: Patient Represenative (must comment) (spouse Horris Latino)    Expected Discharge Plan and Services Expected Discharge Plan: [place]       Living arrangements for the past 2 months: [place]                                      Prior Living Arrangements/Services Living arrangements for the past 2 months: Single Family Home Lives with:: Spouse                   Activities of Daily Living Home Assistive Devices/Equipment: Oxygen ADL Screening (condition at time of admission) Patient's cognitive ability adequate to safely complete daily activities?: Yes Is the patient deaf or have difficulty hearing?: No Does the patient have difficulty seeing, even when wearing glasses/contacts?: No Does the patient have difficulty concentrating, remembering, or making decisions?: No Patient able to express need for assistance with ADLs?: Yes Does the patient have difficulty dressing or bathing?: No Independently performs ADLs?:  Yes (appropriate for developmental age) Does the patient have difficulty walking or climbing stairs?: Yes Weakness of Legs: Both Weakness of Arms/Hands: None  Permission Sought/Granted                  Emotional Assessment              Admission diagnosis:  Paroxysmal atrial fibrillation (HCC) [I48.0] Pleural effusion [J90] Pleural effusion, right [J90] Acute respiratory failure with hypoxia ([place]) [J96.01] Patient Active Problem List   Diagnosis Date Noted   Acute respiratory failure with hypoxia ([place]) 07/11/2021   CKD (chronic kidney disease), stage III ([place]) 06/27/2021   Pleural effusion on right 06/17/2021   Left foot pain 10/26/2020   Generalized edema 10/26/2020   Chest congestion 10/26/2020   AKI (acute kidney injury) ([place]) 12/02/2018   Primary adenocarcinoma of lower lobe of right lung ([place]) 02/16/2018   Late effect of cerebrovascular accident (CVA) 11/14/2017   Cerebral vascular disease 10/31/2017   CVA (cerebral vascular accident) ([place]) 10/31/2017   History of CVA (cerebrovascular accident) 10/30/2017   Lung mass 09/11/2017   Hematoma of neck 03/14/2017   PAF (paroxysmal atrial fibrillation) ([place]) 03/14/2017   Acute on chronic HFrEF (heart failure with reduced ejection fraction) ([place]) 03/14/2017   Dysphagia 03/13/2017   Cervical spondylosis with myelopathy 03/07/2017   Restrictive lung disease 09/26/2016   Cervical neck  pain with evidence of disc disease 07/04/2016   PVC (premature ventricular contraction) 11/16/2015   Adenocarcinoma of prostate ([place]) 07/27/2015   Arthritis 07/27/2015   BPH (benign prostatic hyperplasia) 07/27/2015   COPD exacerbation ([place]) 07/27/2015   GERD (gastroesophageal reflux disease) 07/27/2015   Gouty arthritis of left great toe 07/27/2015   Hyperglycemia 07/27/2015   Hypotension 07/27/2015   Insomnia 07/27/2015   Shortness of breath 07/27/2015   TIA (transient ischemic attack) 07/27/2015   Urinary hesitancy 09/02/2012    ED (erectile dysfunction) of organic origin 06/10/2010   CAD (coronary artery disease) 03/14/2010   LBP (low back pain) 03/01/2009   Allergic rhinitis 09/28/2008   Essential (primary) hypertension 08/02/2007   Hyperlipidemia 08/02/2007   Acquired spondylolisthesis 03/20/2005   Diverticulosis of colon without hemorrhage 12/18/2000   Arthropathy 12/18/1998   PCP:  Birdie Sons, MD Pharmacy:   [place], [place] - [address] [place] [address] Phone: [phone] Fax: [phone]  [facility]) [place], [place] [address] Phone: [phone] Fax: [phone]     Social Determinants of Health (SDOH) Interventions    Readmission Risk Interventions No flowsheet data found.

## 2021-06-27 NOTE — Consult Note (Signed)
Cardiology Consultation Note    Patient ID: [name], MRN: [id number], DOB/AGE: [birth year] 87 y.o. Admit date: 07/03/2021   Date of Consult: 06/27/2021 Primary Physician: Birdie Sons, MD Primary Cardiologist: Dr. Ubaldo Glassing  Chief Complaint: sob Reason for Consultation: chf/pleural effusions Requesting MD: Dr. Max Sane  HPI: James [name] is a 87 y.o. male with history of atrial fibrillation, HF R EF with a known ejection fraction of 37%, history of coronary disease status post coronary artery bypass grafting and more recently treated medically, history of right lower lobe lung adenocarcinoma status post radiation therapy, history of CVA, hypertension, hyperlipidemia, sleep apnea noncompliant with CPAP on 2 L of oxygen at night, history of a CVA, history of peripheral edema who was admitted with increasing shortness of breath and noted to have a large right pleural effusion with evidence of probable pulmonary vascular congestion.  He underwent thoracentesis with removal of 1.1 L yesterday.  He is feeling somewhat better but still has shortness of breath.  Denies orthopnea or PND.  He denies chest chest pain.  An attempt was to place him on spironolactone to help with his edema however this caused blurred vision as this was held.  Patient was started on Lasix and thoracentesis was carried out as discussed above.  Eliquis was held.  Patient had borderline elevated but flat troponins at 26x2.  Past Medical History:  Diagnosis Date   Arthritis    Back pain    CAD (coronary artery disease)    CAP (community acquired pneumonia) 11/06/2017   Cataract    right   COPD (chronic obstructive pulmonary disease) ([place])    SPiriva and SYmbicort daily. Albuterol as needed   Diverticulosis    Dyspnea    with exertion   Enlarged prostate    takes Flomax daily   GERD (gastroesophageal reflux disease)    History of chicken pox    History of gout    History of measles    History of mumps    HOH (hard  of hearing)    Hyperglycemia    Hyperlipidemia    takes Atorvastatin daily   Hypertension    takes Metoprolol daily as well as Lotensin HCT   Hypotension    Joint pain    Microscopic colitis    PAF (paroxysmal atrial fibrillation) ([place]), RVR 03/14/2017   Pneumonia    Prostate cancer ([place])    Stroke ([place])    TIA   TIA (transient ischemic attack)    Weakness    numbness and tingling.mainly on right      Surgical History:  Past Surgical History:  Procedure Laterality Date   ANTERIOR CERVICAL DECOMP/DISCECTOMY FUSION N/A 03/07/2017   Procedure: ANTERIOR CERVICAL DECOMPRESSION/DISCECTOMY FUSION CERVICAL THREE- CERVICAL FOUR, CERVICAL FOUR- CERVICAL FIVE;  Surgeon: Newman Pies, MD;  Location: [facility];  Service: Neurosurgery;  Laterality: N/A;  ANTERIOR CERVICAL DECOMPRESSION/DISCECTOMY FUSION CERVICAL 3- CERVICAL 4, CERVIACL 4- CERVICAL 5   APPENDECTOMY  1950   CARDIAC CATHETERIZATION  05/29/2013   EF=40-45%. Moderate pulmonary hypertension. Infero/ lateral hypokinesis   cataract surgery Left    COLONOSCOPY     CORONARY ARTERY BYPASS GRAFT  2011   x 5   ESOPHAGOGASTRODUODENOSCOPY (EGD) WITH PROPOFOL N/A 07/01/2018   Procedure: ESOPHAGOGASTRODUODENOSCOPY (EGD) WITH PROPOFOL;  Surgeon: Lin Landsman, MD;  Location: [facility];  Service: Gastroenterology;  Laterality: N/A;   EYE SURGERY     FLEXIBLE BRONCHOSCOPY N/A 11/19/2017   Procedure: FLEXIBLE BRONCHOSCOPY;  Surgeon: Laverle Hobby,  MD;  Location: [facility];  Service: Pulmonary;  Laterality: N/A;   MRI BRAIN  06/07/2013   Mild chronic involutional changes. No acute abnormalities   MRI of neck     Myocardial Perfusion Scan  05/29/2013   Abnormal myocardial perfusion image consistent with myocardial infarction   PROSTATE SURGERY  11/18/2012   radiation seed   TOTAL HIP ARTHROPLASTY Left 1997   hip joint replacement     Home Meds: Prior to Admission medications   Medication Sig Start Date End Date Taking?  Authorizing Provider  beta carotene w/minerals (OCUVITE) tablet Take 1 tablet by mouth daily.   Yes [provider]  budesonide (PULMICORT) 0.5 MG/2ML nebulizer solution Take 2 mLs (0.5 mg total) by nebulization 2 (two) times daily. Dx:j44.9 08/27/19  Yes Laverle Hobby, MD  ELIQUIS 5 MG TABS tablet TAKE 1 TABLET BY MOUTH TWICE DAILY Patient taking differently: Take 5 mg by mouth 2 (two) times daily. 06/13/21  Yes Birdie Sons, MD  fluticasone (FLONASE) 50 MCG/ACT nasal spray PLACE 2 SPRAYS INTO BOTH NOSTRILS DAILY Patient taking differently: Place 2 sprays into both nostrils daily. 04/08/21  Yes Birdie Sons, MD  lansoprazole (PREVACID) 30 MG capsule TAKE 1 CAPSULE BY MOUTH EVERY DAY Patient taking differently: Take 30 mg by mouth daily at 12 noon. 05/17/21  Yes Birdie Sons, MD  LORazepam (ATIVAN) 1 MG tablet TAKE 1 TABLET BY MOUTH AT BEDTIME Patient taking differently: Take 1 mg by mouth at bedtime. 03/28/21  Yes Birdie Sons, MD  Magnesium 400 MG CAPS Take 1 capsule by mouth daily.    Yes [provider]  metoprolol tartrate (LOPRESSOR) 50 MG tablet TAKE ONE TABLET TWICE DAILY Patient taking differently: Take 50 mg by mouth 2 (two) times daily. 05/26/20  Yes Birdie Sons, MD  montelukast (SINGULAIR) 10 MG tablet TAKE 1 TABLET BY MOUTH AT BEDTIME Patient taking differently: Take 10 mg by mouth at bedtime. 05/17/21  Yes Birdie Sons, MD  MULTIPLE VITAMIN PO Take 1 tablet by mouth daily.    Yes [provider]  Omega-3 Fatty Acids (FISH OIL) 1000 MG CAPS Take 1 capsule daily by mouth.   Yes [provider]  spironolactone (ALDACTONE) 25 MG tablet Take 25 mg by mouth daily. 06/13/21  Yes [provider]  tamsulosin (FLOMAX) 0.4 MG CAPS capsule TAKE 1 CAPSULE BY MOUTH ONCE DAILY Patient taking differently: Take 0.4 mg by mouth daily. 05/17/21  Yes Birdie Sons, MD  torsemide (DEMADEX) 20 MG tablet TAKE 1 TABLET BY MOUTH DAILY.  TAKE IN PLACE OF FUROSEMIDE Patient taking differently: Take 20 mg by mouth daily. 05/17/21  Yes Birdie Sons, MD  albuterol (VENTOLIN HFA) 108 (90 Base) MCG/ACT inhaler Inhale 2 puffs into the lungs every 6 (six) hours as needed. 07/29/20   [provider]  atorvastatin (LIPITOR) 80 MG tablet TAKE 1 TABLET BY MOUTH DAILY AT 6PM Patient taking differently: Take 80 mg by mouth daily. TAKE 1 TABLET BY MOUTH DAILY AT 6PM 05/17/21   Birdie Sons, MD  azithromycin (ZITHROMAX) 250 MG tablet Take 250 mg by mouth 3 (three) times a week. 06/21/21   [provider]  Difluprednate 0.05 % EMUL Apply 1 drop to eye 4 (four) times daily. or one week. Then use twice a day for one week. Patient not taking: Reported on 06/26/2021 04/18/21   [provider]  ipratropium-albuterol (DUONEB) 0.5-2.5 (3) MG/3ML SOLN Take 3 mLs by nebulization 4 (four)  times daily.  01/27/20   [provider]  predniSONE (DELTASONE) 5 MG tablet Take 5 mg by mouth daily. 01/25/21   [provider]    Inpatient Medications:   atorvastatin  80 mg Oral Daily   azithromycin  500 mg Oral Daily   budesonide  0.5 mg Nebulization BID   furosemide  40 mg Intravenous Q12H   ipratropium-albuterol  3 mL Nebulization BID   mouth rinse  15 mL Mouth Rinse BID   melatonin  5 mg Oral QHS   methylPREDNISolone (SOLU-MEDROL) injection  40 mg Intravenous Q12H   metoprolol tartrate  50 mg Oral BID   montelukast  10 mg Oral QHS   potassium chloride  20 mEq Oral Daily   sodium chloride flush  3 mL Intravenous Q12H   tamsulosin  0.4 mg Oral Daily    sodium chloride Stopped (06/26/21 1416)    Allergies:  Allergies  Allergen Reactions   Indomethacin Nausea Only    Social History   Socioeconomic History   Marital status: Married    Spouse name: Not on file   Number of children: 1   Years of education: Not on file   Highest education level: Some college, no degree  Occupational History   Occupation:  Retired  Tobacco Use   Smoking status: Former    Packs/day: 1.00    Years: 11.00    Pack years: 11.00    Types: Cigarettes    Quit date: 02/12/1984    Years since quitting: 37.3   Smokeless tobacco: Never   Tobacco comments:    quit smoking 30 yrs ago  Vaping Use   Vaping Use: Never used  Substance and Sexual Activity   Alcohol use: Yes    Alcohol/week: 1.0 - 2.0 standard drink    Types: 1 - 2 Cans of beer per week   Drug use: No   Sexual activity: Not on file  Other Topics Concern   Not on file  Social History Narrative   Lives in [place], [place].   Social Determinants of Health   Financial Resource Strain: Low Risk    Difficulty of Paying Living Expenses: Not hard at all  Food Insecurity: No Food Insecurity   Worried About Charity fundraiser in the Last Year: Never true   [place] in the Last Year: Never true  Transportation Needs: No Transportation Needs   Lack of Transportation (Medical): No   Lack of Transportation (Non-Medical): No  Physical Activity: Insufficiently Active   Days of Exercise per Week: 7 days   Minutes of Exercise per Session: 10 min  Stress: No Stress Concern Present   Feeling of Stress : Not at all  Social Connections: Socially Integrated   Frequency of Communication with Friends and Family: Three times a week   Frequency of Social Gatherings with Friends and Family: Three times a week   Attends Religious Services: More than 4 times per year   Active Member of Clubs or Organizations: Yes   Attends Music therapist: More than 4 times per year   Marital Status: Married  Human resources officer Violence: Not At Risk   Fear of Current or Ex-Partner: No   Emotionally Abused: No   Physically Abused: No   Sexually Abused: No     Family History  Problem Relation Age of Onset   Heart attack Mother    Stroke Father    Heart attack Father    Hypertension Father  Heart attack Sister    Bladder Cancer Brother    Kidney cancer  Brother    Heart Problems Brother      Review of Systems: A 12-system review of systems was performed and is negative except as noted in the HPI.  Labs: No results for input(s): CKTOTAL, CKMB, TROPONINI in the last 72 hours. Lab Results  Component Value Date   WBC 6.0 06/27/2021   HGB 15.7 06/27/2021   HCT 48.1 06/27/2021   MCV 92.5 06/27/2021   PLT 149 (L) 06/27/2021    Recent Labs  Lab 06/26/21 0502 06/27/21 0536  NA 139 140  K 3.9 4.3  CL 95* 98  CO2 32 34*  BUN 28* 37*  CREATININE 1.51* 1.10  CALCIUM 8.4* 8.5*  PROT 7.2  --   BILITOT 0.8  --   ALKPHOS 73  --   ALT 32  --   AST 43*  --   GLUCOSE 149* 150*   Lab Results  Component Value Date   CHOL 99 12/03/2018   HDL 55 12/03/2018   LDLCALC 34 12/03/2018   TRIG 51 12/03/2018   No results found for: DDIMER  Radiology/Studies:  CT Chest Wo Contrast  Result Date: 07/14/2021 CLINICAL DATA:  87 year old male with respiratory distress. EXAM: CT CHEST WITHOUT CONTRAST TECHNIQUE: Multidetector CT imaging of the chest was performed following the standard protocol without IV contrast. COMPARISON:  Chest radiograph dated 07/05/2021 and CT dated 01/20/2021. FINDINGS: Evaluation of this exam is limited in the absence of intravenous contrast. Cardiovascular: There is no cardiomegaly or pericardial effusion. Advanced 3 vessel coronary vascular calcification postsurgical changes of CABG. There is moderate atherosclerotic calcification of the thoracic aorta. No aneurysmal dilatation. The central pulmonary arteries are grossly unremarkable. Mediastinum/Nodes: No mediastinal adenopathy. Evaluation of hilar lymph nodes is very limited due to consolidative changes of the lung. The esophagus is grossly unremarkable. No mediastinal fluid collection. Lungs/Pleura: Large right pleural effusion, increased in size since the prior CT. There is near complete compressive atelectasis of the right middle and right lower lobes versus pneumonia. There  is partial compressive atelectasis of the right upper lobe. Several small nodular densities noted in the left upper lobe. There is no pneumothorax. There is narrowing of the right upper lobe bronchus as well as near complete occlusion of the right middle and right lower lobe bronchi. Upper Abdomen: No acute abnormality. Musculoskeletal: Osteopenia with degenerative changes of the spine. Median sternotomy wires. No acute osseous pathology. Cervical ACDF. IMPRESSION: 1. Large right pleural effusion, increased in size since the prior CT. Thoracentesis may provide additional diagnostic value as well as symptomatic relief. 2. Near complete compressive atelectasis of the right middle and right lower lobes versus pneumonia. 3. Aortic Atherosclerosis (ICD10-I70.0). Electronically Signed   By: Anner Crete M.D.   On: 06/30/2021 23:05   DG Chest Port 1 View  Result Date: 06/26/2021 CLINICAL DATA:  Status post right thoracentesis EXAM: PORTABLE CHEST 1 VIEW COMPARISON:  06/29/2021 FINDINGS: Unchanged cardiomegaly. No pulmonary vascular congestion. Postsurgical changes of CABG again seen. Interval decrease of right pleural effusion. No pneumothorax. IMPRESSION: No pneumothorax status post right thoracentesis. Electronically Signed   By: Miachel Roux M.D.   On: 06/26/2021 14:55   DG Chest Portable 1 View  Result Date: 06/23/2021 CLINICAL DATA:  Shortness of breath cough EXAM: PORTABLE CHEST 1 VIEW COMPARISON:  CT 02/17/2021, radiograph 12/06/2020 FINDINGS: Chronic right pleural effusion appears similar to slightly increased from comparison radiograph 12/14/2020. Some increasing left basilar effusion  is noted as well. More bandlike areas of opacity suggestive of atelectasis or scarring. There is diffuse hazy interstitial opacity with pulmonary vascular congestion as well as fissural thickening suggesting some superimposed pulmonary edema. Partial obscuration of the cardiomediastinal contours. Visible contours are  similar to comparison prior counting for differences in technique. Prior sternotomy and CABG. The aorta is calcified. The remaining cardiomediastinal contours are unremarkable. Degenerative changes are present in the imaged spine and shoulders. No other significant osseous abnormality. Telemetry leads overlie the chest. IMPRESSION: 1. Suspect some slight increase in size of a chronic right pleural effusion with increasing left basilar effusion as well. Additional features pulmonary vascular congestion and likely developing interstitial edema could suggest CHF/volume overload 2. Underlying airspace disease in the regions of more coalescent opacity is difficult to fully exclude. 3. Prior sternotomy and CABG. 4.  Aortic Atherosclerosis (ICD10-I70.0). Electronically Signed   By: Lovena Le M.D.   On: 07/12/2021 22:03   US THORACENTESIS ASP PLEURAL SPACE W/IMG GUIDE  Result Date: 06/26/2021 INDICATION: Acute respiratory distress Right pleural effusion EXAM: ULTRASOUND GUIDED RIGHT THORACENTESIS MEDICATIONS: None. COMPLICATIONS: None immediate. PROCEDURE: An ultrasound guided thoracentesis was thoroughly discussed with the patient and questions answered. The benefits, risks, alternatives and complications were also discussed. The patient understands and wishes to proceed with the procedure. Written consent was obtained. Ultrasound was performed to localize and mark an adequate pocket of fluid in the right chest. The area was then prepped and draped in the normal sterile fashion. 1% Lidocaine was used for local anesthesia. Under ultrasound guidance a 6 Fr Safe-T-Centesis catheter was introduced. Thoracentesis was performed. The catheter was removed and a dressing applied. FINDINGS: A total of approximately 1.1 L of bloody fluid was removed. Samples were sent to the laboratory as requested by the clinical team. IMPRESSION: Successful ultrasound guided right thoracentesis yielding 1.1 L of pleural fluid. Electronically  Signed   By: Miachel Roux M.D.   On: 06/26/2021 14:48    Wt Readings from Last 3 Encounters:  06/26/21 81.2 kg  06/06/21 80.7 kg  05/19/21 78.9 kg    EKG: Sinus rhythm with right bundle branch block  Physical Exam:  Blood pressure 131/80, pulse 67, temperature 98.2 F (36.8 C), temperature source Oral, resp. rate 20, height _0  (1.727 m), weight 81.2 kg, SpO2 98 %. Body mass index is 27.22 kg/m. General: Well developed, well nourished, in no acute distress. Head: Normocephalic, atraumatic, sclera non-icteric, no xanthomas, nares are without discharge.  Neck: Negative for carotid bruits. JVD not elevated. Lungs: Decreased breath sounds bilaterally. Heart: RRR with S1 S2.  1/6 to 2/6 systolic murmur Abdomen: Soft, non-tender, non-distended with normoactive bowel sounds. No hepatomegaly. No rebound/guarding. No obvious abdominal masses. Msk:  Strength and tone appear normal for age. Extremities: No clubbing or cyanosis.  1-2+ edema bilaterally.  Distal pedal pulses are 2+ and equal bilaterally. Neuro: Alert and oriented X 3. No facial asymmetry. No focal deficit. Moves all extremities spontaneously. Psych:  Responds to questions appropriately with a normal affect.     Assessment and Plan  87 y.o. male with history of atrial fibrillation, HF R EF with a known ejection fraction of 37%, history of coronary disease status post coronary artery bypass grafting and more recently treated medically, history of right lower lobe lung adenocarcinoma status post radiation therapy, history of CVA, hypertension, hyperlipidemia, sleep apnea noncompliant with CPAP on 2 L of oxygen at night, history of a CVA, history of peripheral edema who was admitted with  increasing shortness of breath and noted to have a large right pleural effusion with evidence of probable pulmonary vascular congestion  1.  Congestive heart failure-patient with a known EF that is mild to moderately reduced at 40%.  Unilateral  pleural effusion is more likely consistent with his right lung adenocarcinoma however would continue with careful diuresis following hemodynamics and renal function postthoracentesis.  2.  Coronary artery disease-appears stable at present  3.  Atrial fibrillation-currently stable.  Continue with rate control with metoprolol and resume Eliquis when pulmonary status and procedures stabilized.  4.  Acute renal insufficiency-improved today.  We will continue to follow while diuresing.  5.  Pleural effusion-appreciate pulmonary's assistance.  We will continue to follow.  Signed, Teodoro Spray MD 06/27/2021, 7:33 AM Pager: [phone]

## 2021-06-27 NOTE — Progress Notes (Signed)
1        at [place] NAME: James [name]    MR#:  [id number]  PCP: Birdie Sons, MD  DATE OF BIRTH:  July 12, 1929  SUBJECTIVE:  CHIEF COMPLAINT:   Chief Complaint  Patient presents with   Shortness of Breath  Sitting in recliner.  Hard of hearing.  Reports feeling better after thoracentesis.  Still requiring 4 L oxygen and some shortness of breath REVIEW OF SYSTEMS:  Review of Systems  Constitutional:  Negative for diaphoresis, fever, malaise/fatigue and weight loss.  HENT:  Positive for hearing loss. Negative for ear discharge, ear pain, nosebleeds, sore throat and tinnitus.   Eyes:  Negative for blurred vision and pain.  Respiratory:  Positive for shortness of breath and wheezing. Negative for cough and hemoptysis.   Cardiovascular:  Positive for orthopnea and leg swelling. Negative for chest pain and palpitations.  Gastrointestinal:  Negative for abdominal pain, blood in stool, constipation, diarrhea, heartburn, nausea and vomiting.  Genitourinary:  Negative for dysuria, frequency and urgency.  Musculoskeletal:  Negative for back pain and myalgias.  Skin:  Negative for itching and rash.  Neurological:  Negative for dizziness, tingling, tremors, focal weakness, seizures, weakness and headaches.  Psychiatric/Behavioral:  Negative for depression. The patient is not nervous/anxious.   DRUG ALLERGIES:   Allergies  Allergen Reactions   Indomethacin Nausea Only   VITALS:  Blood pressure 114/75, pulse 84, temperature 97.9 F (36.6 C), temperature source Oral, resp. rate 18, height 5\' 8"  (1.727 m), weight 81.2 kg, SpO2 97 %. PHYSICAL EXAMINATION:  Physical Exam 87 year old male sitting in the recliner in no acute distress Eyes PERRL HEENT: Central trachea.  Head atraumatic, normocephalic  Lungs: Mild expiratory wheezing bilaterally.  No accessory muscles of respirations in use.  No rhonchi.  Decreased breath sounds at the right base Cardiovascular  S1-S2 normal, no murmur rales or gallop Abdomen soft, benign Skin no rash or lesion Neuro alert and oriented, nonfocal LABORATORY PANEL:  Male CBC Recent Labs  Lab 06/27/21 0536  WBC 6.0  HGB 15.7  HCT 48.1  PLT 149*   ------------------------------------------------------------------------------------------------------------------ Chemistries  Recent Labs  Lab 06/26/21 0502 06/27/21 0536  NA 139 140  K 3.9 4.3  CL 95* 98  CO2 32 34*  GLUCOSE 149* 150*  BUN 28* 37*  CREATININE 1.51* 1.10  CALCIUM 8.4* 8.5*  MG 2.1  --   AST 43*  --   ALT 32  --   ALKPHOS 73  --   BILITOT 0.8  --    RADIOLOGY:  No results found. ASSESSMENT AND PLAN:  87 y.o. male with medical history significant for CAD s/p CABG, HFrEF (EF 40%), PAF on Eliquis, COPD on chronic prednisone 5 mg daily, RLL lung adenocarcinoma s/p radiation therapy, history of CVA, CKD stage IIIa, HTN, HLD, and OSA (not using CPAP, on 2-3 L O2 via [place] at night) admitted for acute hypoxic respiratory failure likely due to large right-sided pleural effusion  7/9: Admitted for large right pleural effusion seen on CT scan 7/10: IR guided thoracentesis with 1.1 L of fluid removal 7/11: Oxygen weaned down to 4 L.  Pulmonary and cardiology consult per patient request  Principal Problem:   Acute respiratory failure with hypoxia ([place]) Active Problems:   CAD (coronary artery disease)   Essential (primary) hypertension   Hyperlipidemia   PAF (paroxysmal atrial fibrillation) (HCC)   Acute on chronic HFrEF (heart failure with reduced ejection fraction) ([place])   History  of CVA (cerebrovascular accident)   Primary adenocarcinoma of lower lobe of right lung (HCC)   CKD (chronic kidney disease), stage III (HCC)   Pleural effusion on right  Acute respiratory failure with hypoxia secondary to large right pleural effusion associated with compressive atelectasis of right middle and right lower lobes: Large right pleural effusion seen on  CT imaging.  Has history of right lower lobe adenocarcinoma.  -Continue IV Lasix 40 mg twice daily -Continue supplemental O2, oxygen weaned to 4 L (from 6 L) s/p thoracentesis on 7/10 -Resume Eliquis -S/p thoracentesis and removal of 1.1 L of bloody fluid.  Pending fluid studies -Consult pulmonary   Acute on chronic HFrEF: Last EF 40%.  Pleural effusion could be related to HFrEF versus malignancy as above. -Continue IV Lasix 40 mg twice daily -Continue Lopressor 50 mg twice daily -Continue potassium supplement -Strict I/O's and daily weights Net IO Since Admission: -548.96 mL [06/27/21 1456]  -Cardiology consult   COPD with acute exacerbation: -Continue IV Solu-Medrol 40 mg twice daily -Continue scheduled DuoNebs with as needed albuterol -Continue Pulmicort and Singulair -Continue azithromycin -Continue supplemental O2 and wean as able   AKI on CKD stage IIIa: Suspect related to vascular congestion and acute hypoxic respiratory failure.  Monitor response to diuresis as above.  Creatinine 1.1 today   Paroxysmal atrial fibrillation: In sinus rhythm with controlled rate on admission.  Eliquis restarted on 7/11.  Continue Lopressor for rate control.   CAD s/p CABG: Denies any chest pain.  Continue Lopressor, statin.   Hypertension: Currently stable.  Continue Lopressor and IV Lasix.   History of CVA: Continue statin.  Eliquis on hold as above.   Hyperlipidemia: Continue atorvastatin.   OSA: Follows with pulmonology, BiPAP was recommended but he is not using it.  Instead uses 2-3 L of O2 via [place] at night.  Continue supplemental O2, BiPAP if tolerated. Consider BiPAP at night or during sleep if need due to hypoxia while here  Generalized weakness PT, OT consult  Body mass index is 27.22 kg/m.  Net IO Since Admission: -548.96 mL [06/27/21 1454]      Status is: Inpatient  Remains inpatient appropriate because:Ongoing diagnostic testing needed not appropriate for  outpatient work up  weaning of his oxygen before can be discharged  Dispo: The patient is from: Home              Anticipated d/c is to: Home with home health depending on PT, OT eval              Patient currently is not medically stable to d/c.   Difficult to place patient No   DVT prophylaxis:       SCDs Start: 06/26/21 0011     Family Communication: Updated [name] at [phone] on 7/10. She didn't answer on 7/11   All the records are reviewed and case discussed with Care Management/Social Worker. Management plans discussed with the patient, family and they are in agreement.  CODE STATUS: DNR Level of care: Progressive Cardiac  TOTAL TIME TAKING CARE OF THIS PATIENT: 35 minutes.   More than 50% of the time was spent in counseling/coordination of care: YES  POSSIBLE D/C IN 2-3 DAYS, DEPENDING ON CLINICAL CONDITION.   Max Sane M.D on 06/27/2021 at 2:54 PM  [facility]   CC: Primary care physician; Birdie Sons, MD  Note: This dictation was prepared with Dragon dictation along with smaller phrase technology. Any transcriptional errors that result from this  process are unintentional.

## 2021-06-27 NOTE — Consult Note (Addendum)
Pulmonary Medicine          Date: 06/27/2021,   MRN# [id number] James [name] [birth year]     AdmissionWeight: 81.2 kg                 CurrentWeight: 81.2 kg   Referring physician: Dr Manuella Ghazi.   CHIEF COMPLAINT:   Acute hypoxemic respiratory failure   HISTORY OF PRESENT ILLNESS   This is a pleasant 87 yo male with hx of CAD, AF, CABG, Advanced COPD on O2 at home, hx of prostate CA lives with wife at home.  He shares that he was in [place] and was able to get booster shot for COVID.  He felt unwell after this with increased O2 requirement and worsening dyspnea.  He was admitted for this and had right pleural effusion drained with bloody fluid on aspirate.  He remains with dyspnea and PCCM consulted for further evaluation and management.    PAST MEDICAL HISTORY   Past Medical History:  Diagnosis Date  . Arthritis   . Back pain   . CAD (coronary artery disease)   . CAP (community acquired pneumonia) 11/06/2017  . Cataract    right  . COPD (chronic obstructive pulmonary disease) ([place])    SPiriva and SYmbicort daily. Albuterol as needed  . Diverticulosis   . Dyspnea    with exertion  . Enlarged prostate    takes Flomax daily  . GERD (gastroesophageal reflux disease)   . History of chicken pox   . History of gout   . History of measles   . History of mumps   . HOH (hard of hearing)   . Hyperglycemia   . Hyperlipidemia    takes Atorvastatin daily  . Hypertension    takes Metoprolol daily as well as Lotensin HCT  . Hypotension   . Joint pain   . Microscopic colitis   . PAF (paroxysmal atrial fibrillation) ([place]), RVR 03/14/2017  . Pneumonia   . Prostate cancer ([place])   . Stroke [facility])    TIA  . TIA (transient ischemic attack)   . Weakness    numbness and tingling.mainly on right     SURGICAL HISTORY   Past Surgical History:  Procedure Laterality Date  . ANTERIOR CERVICAL DECOMP/DISCECTOMY FUSION N/A 03/07/2017   Procedure: ANTERIOR CERVICAL  DECOMPRESSION/DISCECTOMY FUSION CERVICAL THREE- CERVICAL FOUR, CERVICAL FOUR- CERVICAL FIVE;  Surgeon: Newman Pies, MD;  Location: [facility];  Service: Neurosurgery;  Laterality: N/A;  ANTERIOR CERVICAL DECOMPRESSION/DISCECTOMY FUSION CERVICAL 3- CERVICAL 4, CERVIACL 4- CERVICAL 5  . APPENDECTOMY  1950  . CARDIAC CATHETERIZATION  05/29/2013   EF=40-45%. Moderate pulmonary hypertension. Infero/ lateral hypokinesis  . cataract surgery Left   . COLONOSCOPY    . CORONARY ARTERY BYPASS GRAFT  2011   x 5  . ESOPHAGOGASTRODUODENOSCOPY (EGD) WITH PROPOFOL N/A 07/01/2018   Procedure: ESOPHAGOGASTRODUODENOSCOPY (EGD) WITH PROPOFOL;  Surgeon: Lin Landsman, MD;  Location: [facility];  Service: Gastroenterology;  Laterality: N/A;  . EYE SURGERY    . FLEXIBLE BRONCHOSCOPY N/A 11/19/2017   Procedure: FLEXIBLE BRONCHOSCOPY;  Surgeon: Laverle Hobby, MD;  Location: [facility];  Service: Pulmonary;  Laterality: N/A;  . MRI BRAIN  06/07/2013   Mild chronic involutional changes. No acute abnormalities  . MRI of neck    . Myocardial Perfusion Scan  05/29/2013   Abnormal myocardial perfusion image consistent with myocardial infarction  . PROSTATE SURGERY  11/18/2012   radiation seed  . TOTAL HIP ARTHROPLASTY Left 1997  hip joint replacement     FAMILY HISTORY   Family History  Problem Relation Age of Onset  . Heart attack Mother   . Stroke Father   . Heart attack Father   . Hypertension Father   . Heart attack Sister   . Bladder Cancer Brother   . Kidney cancer Brother   . Heart Problems Brother      SOCIAL HISTORY   Social History   Tobacco Use  . Smoking status: Former    Packs/day: 1.00    Years: 11.00    Pack years: 11.00    Types: Cigarettes    Quit date: 02/12/1984    Years since quitting: 37.3  . Smokeless tobacco: Never  . Tobacco comments:    quit smoking 30 yrs ago  Vaping Use  . Vaping Use: Never used  Substance Use Topics  . Alcohol use: Yes     Alcohol/week: 1.0 - 2.0 standard drink    Types: 1 - 2 Cans of beer per week  . Drug use: No     MEDICATIONS    Home Medication:    Current Medication:  Current Facility-Administered Medications:  .  0.9 %  sodium chloride infusion, , Intravenous, PRN, Max Sane, MD, Stopped at 06/26/21 1416 .  acetaminophen (TYLENOL) tablet 650 mg, 650 mg, Oral, Q6H PRN **OR** acetaminophen (TYLENOL) suppository 650 mg, 650 mg, Rectal, Q6H PRN, Posey Pronto, Vishal R, MD .  albuterol (PROVENTIL) (2.5 MG/3ML) 0.083% nebulizer solution 2.5 mg, 2.5 mg, Nebulization, Q2H PRN, Posey Pronto, Vishal R, MD .  atorvastatin (LIPITOR) tablet 80 mg, 80 mg, Oral, Daily, Zada Finders R, MD, 80 mg at 06/26/21 1813 .  azithromycin (ZITHROMAX) tablet 500 mg, 500 mg, Oral, Daily, Zada Finders R, MD, 500 mg at 06/26/21 0959 .  budesonide (PULMICORT) nebulizer solution 0.5 mg, 0.5 mg, Nebulization, BID, Zada Finders R, MD, 0.5 mg at 06/27/21 0803 .  furosemide (LASIX) injection 40 mg, 40 mg, Intravenous, Q12H, Zada Finders R, MD, 40 mg at 06/27/21 0033 .  ipratropium-albuterol (DUONEB) 0.5-2.5 (3) MG/3ML nebulizer solution 3 mL, 3 mL, Nebulization, BID, Max Sane, MD, 3 mL at 06/27/21 0803 .  LORazepam (ATIVAN) tablet 0.5 mg, 0.5 mg, Oral, QHS PRN, Athena Masse, MD, 0.5 mg at 06/27/21 0240 .  MEDLINE mouth rinse, 15 mL, Mouth Rinse, BID, Zada Finders R, MD, 15 mL at 06/26/21 2249 .  melatonin tablet 5 mg, 5 mg, Oral, QHS, Zada Finders R, MD, 5 mg at 06/26/21 2248 .  methylPREDNISolone sodium succinate (SOLU-MEDROL) 40 mg/mL injection 40 mg, 40 mg, Intravenous, Q12H, Zada Finders R, MD, 40 mg at 06/26/21 1948 .  metoprolol tartrate (LOPRESSOR) tablet 50 mg, 50 mg, Oral, BID, Zada Finders R, MD, 50 mg at 06/26/21 2248 .  montelukast (SINGULAIR) tablet 10 mg, 10 mg, Oral, QHS, Zada Finders R, MD, 10 mg at 06/26/21 2248 .  ondansetron (ZOFRAN) tablet 4 mg, 4 mg, Oral, Q6H PRN **OR** ondansetron (ZOFRAN) injection 4 mg, 4 mg,  Intravenous, Q6H PRN, Posey Pronto, Vishal R, MD .  potassium chloride (KLOR-CON) packet 20 mEq, 20 mEq, Oral, Daily, Zada Finders R, MD, 20 mEq at 06/26/21 1000 .  senna-docusate (Senokot-S) tablet 1 tablet, 1 tablet, Oral, QHS PRN, Posey Pronto, Vishal R, MD .  sodium chloride flush (NS) 0.9 % injection 3 mL, 3 mL, Intravenous, Q12H, Zada Finders R, MD, 3 mL at 06/26/21 2249 .  tamsulosin (FLOMAX) capsule 0.4 mg, 0.4 mg, Oral, Daily, Posey Pronto, Vishal R, MD, 0.4 mg at  06/26/21 0959    ALLERGIES   Indomethacin     REVIEW OF SYSTEMS    Review of Systems:  Gen:  Denies  fever, sweats, chills weigh loss  HEENT: Denies blurred vision, double vision, ear pain, eye pain, hearing loss, nose bleeds, sore throat Cardiac:  No dizziness, chest pain or heaviness, chest tightness,edema Resp:   Denies cough or sputum porduction, shortness of breath,wheezing, hemoptysis,  Gi: Denies swallowing difficulty, stomach pain, nausea or vomiting, diarrhea, constipation, bowel incontinence Gu:  Denies bladder incontinence, burning urine Ext:   Denies Joint pain, stiffness or swelling Skin: Denies  skin rash, easy bruising or bleeding or hives Endoc:  Denies polyuria, polydipsia , polyphagia or weight change Psych:   Denies depression, insomnia or hallucinations   Other:  All other systems negative   VS: BP (!) 150/80 (BP Location: Left Arm)   Pulse 84   Temp 97.8 F (36.6 C) (Oral)   Resp 18   Ht _0  (1.727 m)   Wt 81.2 kg   SpO2 97%   BMI 27.22 kg/m      PHYSICAL EXAM    GENERAL:NAD, no fevers, chills, no weakness no fatigue HEAD: Normocephalic, atraumatic.  EYES: Pupils equal, round, reactive to light. Extraocular muscles intact. No scleral icterus.  MOUTH: Moist mucosal membrane. Dentition intact. No abscess noted.  EAR, NOSE, THROAT: Clear without exudates. No external lesions.  NECK: Supple. No thyromegaly. No nodules. No JVD.  PULMONARY: Diffuse coarse rhonchi right sided  +wheezes CARDIOVASCULAR: S1 and S2. Regular rate and rhythm. No murmurs, rubs, or gallops. No edema. Pedal pulses 2+ bilaterally.  GASTROINTESTINAL: Soft, nontender, nondistended. No masses. Positive bowel sounds. No hepatosplenomegaly.  MUSCULOSKELETAL: No swelling, clubbing, or edema. Range of motion full in all extremities.  NEUROLOGIC: Cranial nerves II through XII are intact. No gross focal neurological deficits. Sensation intact. Reflexes intact.  SKIN: No ulceration, lesions, rashes, or cyanosis. Skin warm and dry. Turgor intact.  PSYCHIATRIC: Mood, affect within normal limits. The patient is awake, alert and oriented x 3. Insight, judgment intact.       IMAGING    CT Chest Wo Contrast  Result Date: 06/26/2021 CLINICAL DATA:  87 year old male with respiratory distress. EXAM: CT CHEST WITHOUT CONTRAST TECHNIQUE: Multidetector CT imaging of the chest was performed following the standard protocol without IV contrast. COMPARISON:  Chest radiograph dated 07/04/2021 and CT dated 01/20/2021. FINDINGS: Evaluation of this exam is limited in the absence of intravenous contrast. Cardiovascular: There is no cardiomegaly or pericardial effusion. Advanced 3 vessel coronary vascular calcification postsurgical changes of CABG. There is moderate atherosclerotic calcification of the thoracic aorta. No aneurysmal dilatation. The central pulmonary arteries are grossly unremarkable. Mediastinum/Nodes: No mediastinal adenopathy. Evaluation of hilar lymph nodes is very limited due to consolidative changes of the lung. The esophagus is grossly unremarkable. No mediastinal fluid collection. Lungs/Pleura: Large right pleural effusion, increased in size since the prior CT. There is near complete compressive atelectasis of the right middle and right lower lobes versus pneumonia. There is partial compressive atelectasis of the right upper lobe. Several small nodular densities noted in the left upper lobe. There is no  pneumothorax. There is narrowing of the right upper lobe bronchus as well as near complete occlusion of the right middle and right lower lobe bronchi. Upper Abdomen: No acute abnormality. Musculoskeletal: Osteopenia with degenerative changes of the spine. Median sternotomy wires. No acute osseous pathology. Cervical ACDF. IMPRESSION: 1. Large right pleural effusion, increased in size since the prior  CT. Thoracentesis may provide additional diagnostic value as well as symptomatic relief. 2. Near complete compressive atelectasis of the right middle and right lower lobes versus pneumonia. 3. Aortic Atherosclerosis (ICD10-I70.0). Electronically Signed   By: Anner Crete M.D.   On: 07/11/2021 23:05   DG Chest Port 1 View  Result Date: 06/26/2021 CLINICAL DATA:  Status post right thoracentesis EXAM: PORTABLE CHEST 1 VIEW COMPARISON:  06/20/2021 FINDINGS: Unchanged cardiomegaly. No pulmonary vascular congestion. Postsurgical changes of CABG again seen. Interval decrease of right pleural effusion. No pneumothorax. IMPRESSION: No pneumothorax status post right thoracentesis. Electronically Signed   By: Miachel Roux M.D.   On: 06/26/2021 14:55   DG Chest Portable 1 View  Result Date: 07/07/2021 CLINICAL DATA:  Shortness of breath cough EXAM: PORTABLE CHEST 1 VIEW COMPARISON:  CT 02/17/2021, radiograph 12/06/2020 FINDINGS: Chronic right pleural effusion appears similar to slightly increased from comparison radiograph 12/14/2020. Some increasing left basilar effusion is noted as well. More bandlike areas of opacity suggestive of atelectasis or scarring. There is diffuse hazy interstitial opacity with pulmonary vascular congestion as well as fissural thickening suggesting some superimposed pulmonary edema. Partial obscuration of the cardiomediastinal contours. Visible contours are similar to comparison prior counting for differences in technique. Prior sternotomy and CABG. The aorta is calcified. The remaining  cardiomediastinal contours are unremarkable. Degenerative changes are present in the imaged spine and shoulders. No other significant osseous abnormality. Telemetry leads overlie the chest. IMPRESSION: 1. Suspect some slight increase in size of a chronic right pleural effusion with increasing left basilar effusion as well. Additional features pulmonary vascular congestion and likely developing interstitial edema could suggest CHF/volume overload 2. Underlying airspace disease in the regions of more coalescent opacity is difficult to fully exclude. 3. Prior sternotomy and CABG. 4.  Aortic Atherosclerosis (ICD10-I70.0). Electronically Signed   By: Lovena Le M.D.   On: 07/08/2021 22:03   US THORACENTESIS ASP PLEURAL SPACE W/IMG GUIDE  Result Date: 06/26/2021 INDICATION: Acute respiratory distress Right pleural effusion EXAM: ULTRASOUND GUIDED RIGHT THORACENTESIS MEDICATIONS: None. COMPLICATIONS: None immediate. PROCEDURE: An ultrasound guided thoracentesis was thoroughly discussed with the patient and questions answered. The benefits, risks, alternatives and complications were also discussed. The patient understands and wishes to proceed with the procedure. Written consent was obtained. Ultrasound was performed to localize and mark an adequate pocket of fluid in the right chest. The area was then prepped and draped in the normal sterile fashion. 1% Lidocaine was used for local anesthesia. Under ultrasound guidance a 6 Fr Safe-T-Centesis catheter was introduced. Thoracentesis was performed. The catheter was removed and a dressing applied. FINDINGS: A total of approximately 1.1 L of bloody fluid was removed. Samples were sent to the laboratory as requested by the clinical team. IMPRESSION: Successful ultrasound guided right thoracentesis yielding 1.1 L of pleural fluid. Electronically Signed   By: Miachel Roux M.D.   On: 06/26/2021 14:48      ASSESSMENT/PLAN   Acute on chronic hypoxemic respiratory failure               -Due to complete atelectasis secondary to large right pleural effusion               -this may be due to CHF as his BNP was slightly elevated                - he had throacentesis with imporvement    Right pleural effusion -moderate to large  S/p thoracentesis - fluid profile with bloodly pleural fluid  possibly due to eliquis BID          - patient will need repeat imaging and may need repeat CT chest and may need pigtail catheter drainage          - he has personal hx of cancer and advanced copd with increased risk for carcinoma - cytology is in process          -glucose is 90 which point away from infectious etiology but pH was not done   Advanced COPD with chronic hypoxemia     -agree with solumedrol - will decrease to 40 daily      - will start xopenex and dc remainder of his nebulizer therapy     Complete atelectasis of right lung    - this may be too large for incentive spirometry     - will upgrade to metaneb daily with saline     Thank you for allowing me to participate in the care of this patient.   Patient/Family are satisfied with care plan and all questions have been answered.   This document was prepared using Dragon voice recognition software and may include unintentional dictation errors.     Ottie Glazier, M.D.  Division of [facility]

## 2021-06-27 NOTE — Plan of Care (Signed)

## 2021-06-28 ENCOUNTER — Inpatient Hospital Stay: Payer: Medicare Other

## 2021-06-28 DIAGNOSIS — J9601 Acute respiratory failure with hypoxia: Secondary | ICD-10-CM | POA: Diagnosis not present

## 2021-06-28 LAB — BASIC METABOLIC PANEL
Anion gap: 7 (ref 5–15)
BUN: 39 mg/dL — ABNORMAL HIGH (ref 8–23)
CO2: 39 mmol/L — ABNORMAL HIGH (ref 22–32)
Calcium: 8.6 mg/dL — ABNORMAL LOW (ref 8.9–10.3)
Chloride: 94 mmol/L — ABNORMAL LOW (ref 98–111)
Creatinine, Ser: 1.26 mg/dL — ABNORMAL HIGH (ref 0.61–1.24)
GFR, Estimated: 55 mL/min — ABNORMAL LOW (ref >60)
Glucose, Bld: 124 mg/dL — ABNORMAL HIGH (ref 70–99)
Potassium: 3.9 mmol/L (ref 3.5–5.1)
Sodium: 140 mmol/L (ref 135–145)

## 2021-06-28 MED ORDER — IPRATROPIUM-ALBUTEROL 0.5-2.5 (3) MG/3ML IN SOLN
3.0000 mL | RESPIRATORY_TRACT | Status: DC | PRN
  Administered 2021-06-28 – 2021-06-29 (×3): 3 mL via RESPIRATORY_TRACT
  Filled 2021-06-28 (×3): qty 3

## 2021-06-28 MED ORDER — GUAIFENESIN-DM 100-10 MG/5ML PO SYRP: 5.0000 mL | ORAL_SOLUTION | ORAL | Status: DC | PRN

## 2021-06-28 MED ORDER — LEVALBUTEROL HCL 1.25 MG/0.5ML IN NEBU
1.2500 mg | INHALATION_SOLUTION | Freq: Three times a day (TID) | RESPIRATORY_TRACT | Status: DC
  Administered 2021-06-28 – 2021-06-30 (×4): 1.25 mg via RESPIRATORY_TRACT
  Filled 2021-06-28 (×7): qty 0.5

## 2021-06-28 MED ORDER — GUAIFENESIN-DM 100-10 MG/5ML PO SYRP
10.0000 mL | ORAL_SOLUTION | ORAL | Status: DC | PRN
  Administered 2021-06-28: 10 mL via ORAL
  Filled 2021-06-28: qty 10

## 2021-06-28 MED ORDER — BENZONATATE 100 MG PO CAPS
200.0000 mg | ORAL_CAPSULE | Freq: Three times a day (TID) | ORAL | Status: DC
  Administered 2021-06-28 – 2021-06-29 (×5): 200 mg via ORAL
  Filled 2021-06-28 (×6): qty 2

## 2021-06-28 MED ORDER — AZITHROMYCIN 250 MG PO TABS
250.0000 mg | ORAL_TABLET | ORAL | Status: DC
  Administered 2021-06-29: 250 mg via ORAL
  Filled 2021-06-28: qty 1

## 2021-06-28 MED ORDER — ALBUTEROL SULFATE (2.5 MG/3ML) 0.083% IN NEBU: 3.0000 mL | INHALATION_SOLUTION | Freq: Four times a day (QID) | RESPIRATORY_TRACT | Status: DC | PRN

## 2021-06-28 NOTE — Progress Notes (Signed)
Patient Name: James [name] Date of Encounter: 06/28/2021  Hospital Problem List     Principal Problem:   Acute respiratory failure with hypoxia [facility]) Active Problems:   CAD (coronary artery disease)   Essential (primary) hypertension   Hyperlipidemia   PAF (paroxysmal atrial fibrillation) (HCC)   Acute on chronic HFrEF (heart failure with reduced ejection fraction) (HCC)   History of CVA (cerebrovascular accident)   Primary adenocarcinoma of lower lobe of right lung (HCC)   CKD (chronic kidney disease), stage III (HCC)   Pleural effusion on right    Patient Profile     87 y.o. male with history of atrial fibrillation, HF R EF with a known ejection fraction of 37%, history of coronary disease status post coronary artery bypass grafting and more recently treated medically, history of right lower lobe lung adenocarcinoma status post radiation therapy, history of CVA, hypertension, hyperlipidemia, sleep apnea noncompliant with CPAP on 2 L of oxygen at night, history of a CVA, history of peripheral edema who was admitted with increasing shortness of breath and noted to have a large right pleural effusion with evidence of probable pulmonary vascular congestion.  He underwent thoracentesis with removal of 1.1 L yesterday.  He is feeling somewhat better but still has shortness of breath.  Denies orthopnea or PND.  He denies chest chest pain.  An attempt was to place him on spironolactone to help with his edema however this caused blurred vision as this was held.  Patient was started on Lasix and thoracentesis was carried out as discussed above.  Eliquis was held.  Patient had borderline elevated but flat troponins at 26x2.  Subjective   Less sob,  Inpatient Medications     apixaban  5 mg Oral BID   atorvastatin  80 mg Oral Daily   azithromycin  500 mg Oral Daily   furosemide  40 mg Intravenous Q12H   mouth rinse  15 mL Mouth Rinse BID   melatonin  5 mg Oral QHS   methylPREDNISolone  (SOLU-MEDROL) injection  40 mg Intravenous Daily   metoprolol tartrate  50 mg Oral BID   montelukast  10 mg Oral QHS   potassium chloride  20 mEq Oral Daily   sodium chloride flush  3 mL Intravenous Q12H   tamsulosin  0.4 mg Oral Daily    Vital Signs    Vitals:   06/27/21 2000 06/28/21 0447 06/28/21 0500 06/28/21 0727  BP: 124/74 140/68  (!) 158/92  Pulse: 72 93  91  Resp:  16  16  Temp: 98 F (36.7 C) 98 F (36.7 C)  (!) 97.4 F (36.3 C)  TempSrc: Oral   Oral  SpO2: 94% 100%  100%  Weight:   68.9 kg   Height:        Intake/Output Summary (Last 24 hours) at 06/28/2021 0738 Last data filed at 06/28/2021 0731 Gross per 24 hour  Intake 1200 ml  Output 850 ml  Net 350 ml   Filed Weights   07/14/2021 2137 06/26/21 0500 06/28/21 0500  Weight: 81.2 kg 81.2 kg 68.9 kg    Physical Exam    GEN: Chronically ill appearing .  HEENT: normal.  Neck: Supple, no JVD, carotid bruits, or masses. Cardiac: RRR, no murmurs, rubs, or gallops. No clubbing, cyanosis, edema.  Radials/DP/PT 2+ and equal bilaterally.  Respiratory: Decreased breath sounds bilaterallyrally. GI: Soft, nontender, nondistended, BS + x 4. MS: no deformity or atrophy. Skin: warm and dry, no rash. Neuro:  Strength and sensation are intact. Psych: Normal affect.  Labs    CBC Recent Labs    06/21/2021 2135 06/26/21 0502 06/27/21 0536  WBC 4.5 3.6* 6.0  NEUTROABS 2.8  --   --   HGB 13.8 14.8 15.7  HCT 42.6 47.1 48.1  MCV 92.0 94.0 92.5  PLT 141* 131* 932*   Basic Metabolic Panel Recent Labs    06/26/21 0502 06/27/21 0536  NA 139 140  K 3.9 4.3  CL 95* 98  CO2 32 34*  GLUCOSE 149* 150*  BUN 28* 37*  CREATININE 1.51* 1.10  CALCIUM 8.4* 8.5*  MG 2.1  --    Liver Function Tests Recent Labs    06/18/2021 2135 06/26/21 0502  AST 39 43*  ALT 29 32  ALKPHOS 74 73  BILITOT 0.9 0.8  PROT 6.7 7.2  ALBUMIN 3.8 4.0   No results for input(s): LIPASE, AMYLASE in the last 72 hours. Cardiac Enzymes No  results for input(s): CKTOTAL, CKMB, CKMBINDEX, TROPONINI in the last 72 hours. BNP Recent Labs    07/06/2021 2135  BNP 311.8*   D-Dimer No results for input(s): DDIMER in the last 72 hours. Hemoglobin A1C No results for input(s): HGBA1C in the last 72 hours. Fasting Lipid Panel No results for input(s): CHOL, HDL, LDLCALC, TRIG, CHOLHDL, LDLDIRECT in the last 72 hours. Thyroid Function Tests No results for input(s): TSH, T4TOTAL, T3FREE, THYROIDAB in the last 72 hours.  Invalid input(s): FREET3  Telemetry    nsr  ECG    nsr  Radiology    CT Chest Wo Contrast  Result Date: 06/20/2021 CLINICAL DATA:  87 year old male with respiratory distress. EXAM: CT CHEST WITHOUT CONTRAST TECHNIQUE: Multidetector CT imaging of the chest was performed following the standard protocol without IV contrast. COMPARISON:  Chest radiograph dated 07/11/2021 and CT dated 01/20/2021. FINDINGS: Evaluation of this exam is limited in the absence of intravenous contrast. Cardiovascular: There is no cardiomegaly or pericardial effusion. Advanced 3 vessel coronary vascular calcification postsurgical changes of CABG. There is moderate atherosclerotic calcification of the thoracic aorta. No aneurysmal dilatation. The central pulmonary arteries are grossly unremarkable. Mediastinum/Nodes: No mediastinal adenopathy. Evaluation of hilar lymph nodes is very limited due to consolidative changes of the lung. The esophagus is grossly unremarkable. No mediastinal fluid collection. Lungs/Pleura: Large right pleural effusion, increased in size since the prior CT. There is near complete compressive atelectasis of the right middle and right lower lobes versus pneumonia. There is partial compressive atelectasis of the right upper lobe. Several small nodular densities noted in the left upper lobe. There is no pneumothorax. There is narrowing of the right upper lobe bronchus as well as near complete occlusion of the right middle and right  lower lobe bronchi. Upper Abdomen: No acute abnormality. Musculoskeletal: Osteopenia with degenerative changes of the spine. Median sternotomy wires. No acute osseous pathology. Cervical ACDF. IMPRESSION: 1. Large right pleural effusion, increased in size since the prior CT. Thoracentesis may provide additional diagnostic value as well as symptomatic relief. 2. Near complete compressive atelectasis of the right middle and right lower lobes versus pneumonia. 3. Aortic Atherosclerosis (ICD10-I70.0). Electronically Signed   By: Anner Crete M.D.   On: 07/09/2021 23:05   DG Chest Port 1 View  Result Date: 06/26/2021 CLINICAL DATA:  Status post right thoracentesis EXAM: PORTABLE CHEST 1 VIEW COMPARISON:  07/06/2021 FINDINGS: Unchanged cardiomegaly. No pulmonary vascular congestion. Postsurgical changes of CABG again seen. Interval decrease of right pleural effusion. No pneumothorax. IMPRESSION: No pneumothorax  status post right thoracentesis. Electronically Signed   By: Miachel Roux M.D.   On: 06/26/2021 14:55   DG Chest Portable 1 View  Result Date: 06/17/2021 CLINICAL DATA:  Shortness of breath cough EXAM: PORTABLE CHEST 1 VIEW COMPARISON:  CT 02/17/2021, radiograph 12/06/2020 FINDINGS: Chronic right pleural effusion appears similar to slightly increased from comparison radiograph 12/14/2020. Some increasing left basilar effusion is noted as well. More bandlike areas of opacity suggestive of atelectasis or scarring. There is diffuse hazy interstitial opacity with pulmonary vascular congestion as well as fissural thickening suggesting some superimposed pulmonary edema. Partial obscuration of the cardiomediastinal contours. Visible contours are similar to comparison prior counting for differences in technique. Prior sternotomy and CABG. The aorta is calcified. The remaining cardiomediastinal contours are unremarkable. Degenerative changes are present in the imaged spine and shoulders. No other significant  osseous abnormality. Telemetry leads overlie the chest. IMPRESSION: 1. Suspect some slight increase in size of a chronic right pleural effusion with increasing left basilar effusion as well. Additional features pulmonary vascular congestion and likely developing interstitial edema could suggest CHF/volume overload 2. Underlying airspace disease in the regions of more coalescent opacity is difficult to fully exclude. 3. Prior sternotomy and CABG. 4.  Aortic Atherosclerosis (ICD10-I70.0). Electronically Signed   By: Lovena Le M.D.   On: 07/10/2021 22:03   US THORACENTESIS ASP PLEURAL SPACE W/IMG GUIDE  Result Date: 06/26/2021 INDICATION: Acute respiratory distress Right pleural effusion EXAM: ULTRASOUND GUIDED RIGHT THORACENTESIS MEDICATIONS: None. COMPLICATIONS: None immediate. PROCEDURE: An ultrasound guided thoracentesis was thoroughly discussed with the patient and questions answered. The benefits, risks, alternatives and complications were also discussed. The patient understands and wishes to proceed with the procedure. Written consent was obtained. Ultrasound was performed to localize and mark an adequate pocket of fluid in the right chest. The area was then prepped and draped in the normal sterile fashion. 1% Lidocaine was used for local anesthesia. Under ultrasound guidance a 6 Fr Safe-T-Centesis catheter was introduced. Thoracentesis was performed. The catheter was removed and a dressing applied. FINDINGS: A total of approximately 1.1 L of bloody fluid was removed. Samples were sent to the laboratory as requested by the clinical team. IMPRESSION: Successful ultrasound guided right thoracentesis yielding 1.1 L of pleural fluid. Electronically Signed   By: Miachel Roux M.D.   On: 06/26/2021 14:48    Assessment & Plan    87 y.o. male with history of atrial fibrillation, HF R EF with a known ejection fraction of 37%, history of coronary disease status post coronary artery bypass grafting and more  recently treated medically, history of right lower lobe lung adenocarcinoma status post radiation therapy, history of CVA, hypertension, hyperlipidemia, sleep apnea noncompliant with CPAP on 2 L of oxygen at night, history of a CVA, history of peripheral edema who was admitted with increasing shortness of breath and noted to have a large right pleural effusion with evidence of probable pulmonary vascular congestion  1.  Congestive heart failure-patient with a known EF that is mild to moderately reduced at 40%.  Unilateral pleural effusion is more likely consistent with his right lung adenocarcinoma however would continue with careful diuresis following hemodynamics and renal function postthoracentesis.  2.  Coronary artery disease-appears stable at present. Denies chest pain. Has ruled out for an mi.   3.  Atrial fibrillation-currently stable.  Continue with rate control with metoprolol and resume Eliquis when pulmonary status and procedures stabilized.  4.  Acute renal insufficiency-improved yesterday. Will recheck met b today.  We will continue to follow while diuresing.  5.  Pleural effusion-appreciate pulmonary's assistance.  We will continue to follow.  Signed, Javier Docker Jacqulyne Gladue MD 06/28/2021, 7:38 AM  Pager: [phone]

## 2021-06-28 NOTE — NC FL2 (Signed)
[place] LEVEL OF CARE SCREENING TOOL     IDENTIFICATION  Patient Name: James [name] Birthdate: 1929-01-09 Sex: male Admission Date (Current Location): 07/09/2021  [facility] and [place] Number:  Engineering geologist and Address:  [facility], [address], [place], [place] [address]      Provider Number: [id number]  Attending Physician Name and Address:  Fritzi Mandes, MD  Relative Name and Phone Number:  Horris Latino (spouse) [phone]    Current Level of Care: Hospital Recommended Level of Care: [place] Prior Approval Number:    Date Approved/Denied:   PASRR Number: [id number]  Discharge Plan: SNF    Current Diagnoses: Patient Active Problem List   Diagnosis Date Noted   Acute respiratory failure with hypoxia ([facility]) 06/24/2021   CKD (chronic kidney disease), stage III ([place]) 06/24/2021   Pleural effusion on right 06/27/2021   Left foot pain 10/26/2020   Generalized edema 10/26/2020   Chest congestion 10/26/2020   AKI (acute kidney injury) ([place]) 12/02/2018   Primary adenocarcinoma of lower lobe of right lung ([place]) 02/16/2018   Late effect of cerebrovascular accident (CVA) 11/14/2017   Cerebral vascular disease 10/31/2017   CVA (cerebral vascular accident) ([place]) 10/31/2017   History of CVA (cerebrovascular accident) 10/30/2017   Lung mass 09/11/2017   Hematoma of neck 03/14/2017   PAF (paroxysmal atrial fibrillation) ([place]) 03/14/2017   Acute on chronic HFrEF (heart failure with reduced ejection fraction) ([place]) 03/14/2017   Dysphagia 03/13/2017   Cervical spondylosis with myelopathy 03/07/2017   Restrictive lung disease 09/26/2016   Cervical neck pain with evidence of disc disease 07/04/2016   PVC (premature ventricular contraction) 11/16/2015   Adenocarcinoma of prostate ([place]) 07/27/2015   Arthritis 07/27/2015   BPH (benign prostatic hyperplasia) 07/27/2015   COPD exacerbation ([place]) 07/27/2015   GERD  (gastroesophageal reflux disease) 07/27/2015   Gouty arthritis of left great toe 07/27/2015   Hyperglycemia 07/27/2015   Hypotension 07/27/2015   Insomnia 07/27/2015   Shortness of breath 07/27/2015   TIA (transient ischemic attack) 07/27/2015   Urinary hesitancy 09/02/2012   ED (erectile dysfunction) of organic origin 06/10/2010   CAD (coronary artery disease) 03/14/2010   LBP (low back pain) 03/01/2009   Allergic rhinitis 09/28/2008   Essential (primary) hypertension 08/02/2007   Hyperlipidemia 08/02/2007   Acquired spondylolisthesis 03/20/2005   Diverticulosis of colon without hemorrhage 12/18/2000   Arthropathy 12/18/1998    Orientation RESPIRATION BLADDER Height & Weight     Self, Time, Situation, Place  O2 (5L nasal cannula) Continent Weight: 151 lb 12.8 oz (68.9 kg) Height:  5\' 8"  (172.7 cm)  BEHAVIORAL SYMPTOMS/MOOD NEUROLOGICAL BOWEL NUTRITION STATUS      Continent Diet (see discharge summary)  AMBULATORY STATUS COMMUNICATION OF NEEDS Skin   Extensive Assist Verbally Normal                       Personal Care Assistance Level of Assistance  Bathing, Feeding, Dressing, Total care Bathing Assistance: Limited assistance Feeding assistance: Independent Dressing Assistance: Limited assistance Total Care Assistance: Maximum assistance   Functional Limitations Info  Hearing, Speech, Sight Sight Info: Adequate Hearing Info: Impaired Speech Info: Adequate    SPECIAL CARE FACTORS FREQUENCY  PT (By licensed PT), OT (By licensed OT)     PT Frequency: min 4x weekly OT Frequency: min 4x weekly            Contractures Contractures Info: Not present    Additional Factors Info  Code Status, Allergies Code Status Info: DNR Allergies Info: Indomethacin           Current Medications (06/28/2021):  This is the current hospital active medication list Current Facility-Administered Medications  Medication Dose Route Frequency Provider Last Rate Last Admin    0.9 %  sodium chloride infusion   Intravenous PRN Max Sane, MD   Stopped at 06/26/21 1416   acetaminophen (TYLENOL) tablet 650 mg  650 mg Oral Q6H PRN Lenore Cordia, MD   650 mg at 06/28/21 2703   Or   acetaminophen (TYLENOL) suppository 650 mg  650 mg Rectal Q6H PRN Lenore Cordia, MD       albuterol (PROVENTIL) (2.5 MG/3ML) 0.083% nebulizer solution 3 mL  3 mL Inhalation Q6H PRN Fritzi Mandes, MD       apixaban Arne Cleveland) tablet 5 mg  5 mg Oral BID Max Sane, MD   5 mg at 06/28/21 0903   atorvastatin (LIPITOR) tablet 80 mg  80 mg Oral Daily Lenore Cordia, MD   80 mg at 06/27/21 1709   azithromycin (ZITHROMAX) tablet 500 mg  500 mg Oral Daily Zada Finders R, MD   500 mg at 06/28/21 5009   benzonatate (TESSALON) capsule 200 mg  200 mg Oral TID Fritzi Mandes, MD   200 mg at 06/28/21 0904   furosemide (LASIX) injection 40 mg  40 mg Intravenous Q12H Zada Finders R, MD   40 mg at 06/28/21 1133   guaiFENesin-dextromethorphan (ROBITUSSIN DM) 100-10 MG/5ML syrup 10 mL  10 mL Oral Q4H PRN Fritzi Mandes, MD   10 mL at 06/28/21 0904   ipratropium-albuterol (DUONEB) 0.5-2.5 (3) MG/3ML nebulizer solution 3 mL  3 mL Nebulization Q4H PRN Fritzi Mandes, MD   3 mL at 06/28/21 1133   levalbuterol (XOPENEX) nebulizer solution 1.25 mg  1.25 mg Nebulization TID Fritzi Mandes, MD       LORazepam (ATIVAN) tablet 0.5 mg  0.5 mg Oral QHS PRN Athena Masse, MD   0.5 mg at 06/27/21 2223   MEDLINE mouth rinse  15 mL Mouth Rinse BID Zada Finders R, MD   15 mL at 06/28/21 1133   melatonin tablet 5 mg  5 mg Oral QHS Zada Finders R, MD   5 mg at 06/27/21 2222   methylPREDNISolone sodium succinate (SOLU-MEDROL) 40 mg/mL injection 40 mg  40 mg Intravenous Daily Ottie Glazier, MD   40 mg at 06/28/21 0903   metoprolol tartrate (LOPRESSOR) tablet 50 mg  50 mg Oral BID Zada Finders R, MD   50 mg at 06/28/21 0903   montelukast (SINGULAIR) tablet 10 mg  10 mg Oral QHS Zada Finders R, MD   10 mg at 06/27/21 2223   ondansetron  (ZOFRAN) tablet 4 mg  4 mg Oral Q6H PRN Lenore Cordia, MD       Or   ondansetron (ZOFRAN) injection 4 mg  4 mg Intravenous Q6H PRN Zada Finders R, MD       potassium chloride (KLOR-CON) packet 20 mEq  20 mEq Oral Daily Zada Finders R, MD   20 mEq at 06/28/21 0903   senna-docusate (Senokot-S) tablet 1 tablet  1 tablet Oral QHS PRN Lenore Cordia, MD   1 tablet at 06/28/21 0904   sodium chloride flush (NS) 0.9 % injection 3 mL  3 mL Intravenous Q12H Zada Finders R, MD   3 mL at 06/28/21 1133   tamsulosin (FLOMAX) capsule 0.4 mg  0.4 mg Oral Daily Posey Pronto,  Cleaster Corin, MD   0.4 mg at 06/28/21 5927     Discharge Medications: Please see discharge summary for a list of discharge medications.  Relevant Imaging Results:  Relevant Lab Results:   Additional Information SSN: [id number]  Alberteen Sam, LCSW

## 2021-06-28 NOTE — Evaluation (Signed)
Physical Therapy Evaluation Patient Details Name: James [name] MRN: [id number] DOB: 08-15-1929 Today's Date: 06/28/2021   History of Present Illness  James [name] is a 87 y.o. male with a history of CAD, COPD, hypertension, paroxysmal atrial fibrillation who comes ED complaining of worsening shortness of breath and productive cough for the past week.  He uses 2 L nasal cannula at home as needed.  Today, on 3 L his oxygen saturation was 85%.  EMS brings him to the ED on nonrebreather. Currently on 5L with sats at 94%.   Clinical Impression  Pt admitted with above diagnosis. Received sitting in recliner hunched over with sitter in room. Agreeable to PT services. Resting vitals WNL HR 83-85 BOM and SPO2 at 95% on 5 L/min. Prior to  mobility pt reports at baseline he is a household Ambulator indep with all functional mobility and ADL's and has his wife to assist at home as needed. Pt placed on 4L/min due to 5 L/min on O2 tank not available. Supervision for STS from recliner with no AD. Intermittent reliance noted on SUE support on counter for support but pt displays ability to statically stand with normal BOS and no AD for 30 sec with no sway or LOB. Pt amb within room with shuffling pattern and continued reliance on objects in room to maintain stability. SOB limiting pt in further mobility so pt returned to recliner. SPO2 at 94% post ambulation on 4 L/min and HR in mid 80's. Pt educated on being at increased risk of falls for relying on objects in room. RN notified to utilize RW to optimize safety within room ambulation. Pt demonstrates deficits in balance and capacity to perform short household distances which is pt's baseline function. At this time pt would benefit from STR but if SOB improves and pt displays safe utilization of RW and improved walking distances, could see D/c recs changing to [facility] PT with spouse assist PRN. Pt currently with functional limitations due to the deficits listed below (see PT  Problem List). Pt will benefit from skilled PT to increase their independence and safety with mobility to allow discharge to the venue listed below.      Follow Up Recommendations SNF    Equipment Recommendations  3in1 (PT);Other (comment) (if pt were to d/c home)    Recommendations for Other Services       Precautions / Restrictions Precautions Precautions: Fall Precaution Comments: Relies on furniture in room intermittently to walk with no AD Restrictions Weight Bearing Restrictions: No      Mobility  Bed Mobility               General bed mobility comments: Pt received and returned to recliner.    Transfers Overall transfer level: Needs assistance Equipment used: None Transfers: Sit to/from Stand Sit to Stand: Supervision         General transfer comment: no AD needed however relies on window sill and other equpiment in room intermittently to improve his stability.  Ambulation/Gait Ambulation/Gait assistance: Min guard Gait Distance (Feet): 16 Feet Assistive device: None Gait Pattern/deviations: Trunk flexed;Narrow base of support;Shuffle     General Gait Details: Intermitent reliance on equipment and counters in room to ambulate with no AD. LImited mostly by reports of SOB.  Stairs            Wheelchair Mobility    Modified Rankin (Stroke Patients Only)       Balance Overall balance assessment: Needs assistance Sitting-balance support: No upper extremity  supported;Feet supported Sitting balance-Leahy Scale: Fair       Standing balance-Leahy Scale: Fair Standing balance comment: Intermittent need of SUE support on surfaces in room for stability. Able to statically stand with no AD with no sway or LOB.                             Pertinent Vitals/Pain Pain Assessment: No/denies pain    Home Living Family/patient expects to be discharged to:: Private residence Living Arrangements: Spouse/significant other Available Help at  Discharge: Family Type of Home: House Home Access: Stairs to enter Entrance Stairs-Rails: Can reach both Entrance Stairs-Number of Steps: 3 Home Layout: One level Home Equipment: Cane - single point;Walker - 2 wheels Additional Comments: Wishes for grab bars and shower seat for returning home and BSC.    Prior Function Level of Independence: Independent         Comments: Pt reports being independent with all functional mobility and ADL's. Has wife at home who can assist as needed.     Hand Dominance   Dominant Hand: Right    Extremity/Trunk Assessment   Upper Extremity Assessment Upper Extremity Assessment: Defer to OT evaluation    Lower Extremity Assessment Lower Extremity Assessment: Generalized weakness    Cervical / Trunk Assessment Cervical / Trunk Assessment: Kyphotic  Communication   Communication: No difficulties;HOH  Cognition Arousal/Alertness: Awake/alert Behavior During Therapy: WFL for tasks assessed/performed Overall Cognitive Status: Within Functional Limits for tasks assessed                                        General Comments      Exercises Other Exercises Other Exercises: Education on not utilizing surfaces in room for stability due to increased risk of falls.   Assessment/Plan    PT Assessment Patient needs continued PT services  PT Problem List Decreased strength;Cardiopulmonary status limiting activity;Decreased activity tolerance;Decreased safety awareness;Decreased mobility       PT Treatment Interventions Gait training;Therapeutic exercise;DME instruction;Balance training;Stair training;Neuromuscular re-education;Functional mobility training;Therapeutic activities;Patient/family education    PT Goals (Current goals can be found in the Care Plan section)  Acute Rehab PT Goals Patient Stated Goal: to go home PT Goal Formulation: With patient Time For Goal Achievement: 07/12/21 Potential to Achieve Goals:  Good    Frequency Min 2X/week   Barriers to discharge        Co-evaluation               AM-PAC PT "6 Clicks" Mobility  Outcome Measure Help needed turning from your back to your side while in a flat bed without using bedrails?: A Lot Help needed moving from lying on your back to sitting on the side of a flat bed without using bedrails?: A Lot Help needed moving to and from a bed to a chair (including a wheelchair)?: A Little Help needed standing up from a chair using your arms (e.g., wheelchair or bedside chair)?: A Little Help needed to walk in hospital room?: A Little Help needed climbing 3-5 steps with a railing? : A Lot 6 Click Score: 15    End of Session Equipment Utilized During Treatment: Gait belt;Oxygen Activity Tolerance: Patient limited by fatigue;Treatment limited secondary to medical complications (Comment) Patient left: in chair;with call bell/phone within reach;with nursing/sitter in room Nurse Communication: Mobility status PT Visit Diagnosis: Unsteadiness on feet (R26.81);Other  abnormalities of gait and mobility (R26.89);Muscle weakness (generalized) (M62.81)    Time: 6301-6010 PT Time Calculation (min) (ACUTE ONLY): 17 min   Charges:   PT Evaluation $PT Eval Moderate Complexity: 1 Mod PT Treatments $Therapeutic Activity: 8-22 mins        Courtnee Myer M. Fairly IV, PT, DPT Physical Therapist- [facility]  06/28/2021, 10:41 AM

## 2021-06-28 NOTE — Progress Notes (Signed)
Pulmonary Medicine          Date: 06/28/2021,   MRN# [id number] James [name] 09/24/1929     AdmissionWeight: 81.2 kg                 CurrentWeight: 68.9 kg   Referring physician: Dr Manuella Ghazi.   CHIEF COMPLAINT:   Acute hypoxemic respiratory failure   HISTORY OF PRESENT ILLNESS   This is a pleasant 87 yo male with hx of CAD, AF, CABG, Advanced COPD on O2 at home, hx of prostate CA lives with wife at home.  He shares that he was in [place] and was able to get booster shot for COVID.  He felt unwell after this with increased O2 requirement and worsening dyspnea.  He was admitted for this and had right pleural effusion drained with bloody fluid on aspirate.  He remains with dyspnea and PCCM consulted for further evaluation and management.   06/28/21-  patient is improved.  He had PT and will need additional rehab on outpatient.  He feels breathing is better.  CXR done this AM looks reassuring with minimal residual pleural fluid. Cardiac status much improved.  There is mild aki this am. Recommend d/c planning patient is close to baseline.   PAST MEDICAL HISTORY   Past Medical History:  Diagnosis Date   Arthritis    Back pain    CAD (coronary artery disease)    CAP (community acquired pneumonia) 11/06/2017   Cataract    right   COPD (chronic obstructive pulmonary disease) ([place])    SPiriva and SYmbicort daily. Albuterol as needed   Diverticulosis    Dyspnea    with exertion   Enlarged prostate    takes Flomax daily   GERD (gastroesophageal reflux disease)    History of chicken pox    History of gout    History of measles    History of mumps    HOH (hard of hearing)    Hyperglycemia    Hyperlipidemia    takes Atorvastatin daily   Hypertension    takes Metoprolol daily as well as Lotensin HCT   Hypotension    Joint pain    Microscopic colitis    PAF (paroxysmal atrial fibrillation) ([place]), RVR 03/14/2017   Pneumonia    Prostate cancer ([place])    Stroke ([place])     TIA   TIA (transient ischemic attack)    Weakness    numbness and tingling.mainly on right     SURGICAL HISTORY   Past Surgical History:  Procedure Laterality Date   ANTERIOR CERVICAL DECOMP/DISCECTOMY FUSION N/A 03/07/2017   Procedure: ANTERIOR CERVICAL DECOMPRESSION/DISCECTOMY FUSION CERVICAL THREE- CERVICAL FOUR, CERVICAL FOUR- CERVICAL FIVE;  Surgeon: Newman Pies, MD;  Location: [facility];  Service: Neurosurgery;  Laterality: N/A;  ANTERIOR CERVICAL DECOMPRESSION/DISCECTOMY FUSION CERVICAL 3- CERVICAL 4, CERVIACL 4- CERVICAL 5   APPENDECTOMY  1950   CARDIAC CATHETERIZATION  05/29/2013   EF=40-45%. Moderate pulmonary hypertension. Infero/ lateral hypokinesis   cataract surgery Left    COLONOSCOPY     CORONARY ARTERY BYPASS GRAFT  2011   x 5   ESOPHAGOGASTRODUODENOSCOPY (EGD) WITH PROPOFOL N/A 07/01/2018   Procedure: ESOPHAGOGASTRODUODENOSCOPY (EGD) WITH PROPOFOL;  Surgeon: Lin Landsman, MD;  Location: [facility];  Service: Gastroenterology;  Laterality: N/A;   EYE SURGERY     FLEXIBLE BRONCHOSCOPY N/A 11/19/2017   Procedure: FLEXIBLE BRONCHOSCOPY;  Surgeon: Laverle Hobby, MD;  Location: [facility];  Service: Pulmonary;  Laterality: N/A;  MRI BRAIN  06/07/2013   Mild chronic involutional changes. No acute abnormalities   MRI of neck     Myocardial Perfusion Scan  05/29/2013   Abnormal myocardial perfusion image consistent with myocardial infarction   PROSTATE SURGERY  11/18/2012   radiation seed   TOTAL HIP ARTHROPLASTY Left 1997   hip joint replacement     FAMILY HISTORY   Family History  Problem Relation Age of Onset   Heart attack Mother    Stroke Father    Heart attack Father    Hypertension Father    Heart attack Sister    Bladder Cancer Brother    Kidney cancer Brother    Heart Problems Brother      SOCIAL HISTORY   Social History   Tobacco Use   Smoking status: Former    Packs/day: 1.00    Years: 11.00    Pack years: 11.00    Types:  Cigarettes    Quit date: 02/12/1984    Years since quitting: 37.4   Smokeless tobacco: Never   Tobacco comments:    quit smoking 30 yrs ago  Vaping Use   Vaping Use: Never used  Substance Use Topics   Alcohol use: Yes    Alcohol/week: 1.0 - 2.0 standard drink    Types: 1 - 2 Cans of beer per week   Drug use: No     MEDICATIONS    Home Medication:    Current Medication:  Current Facility-Administered Medications:    0.9 %  sodium chloride infusion, , Intravenous, PRN, Max Sane, MD, Stopped at 06/26/21 1416   acetaminophen (TYLENOL) tablet 650 mg, 650 mg, Oral, Q6H PRN, 650 mg at 06/28/21 0903 **OR** acetaminophen (TYLENOL) suppository 650 mg, 650 mg, Rectal, Q6H PRN, Lenore Cordia, MD   apixaban (ELIQUIS) tablet 5 mg, 5 mg, Oral, BID, Manuella Ghazi, Vipul, MD, 5 mg at 06/28/21 0903   atorvastatin (LIPITOR) tablet 80 mg, 80 mg, Oral, Daily, Zada Finders R, MD, 80 mg at 06/27/21 1709   azithromycin (ZITHROMAX) tablet 500 mg, 500 mg, Oral, Daily, Posey Pronto, Roxanne Mins R, MD, 500 mg at 06/28/21 6568   benzonatate (TESSALON) capsule 200 mg, 200 mg, Oral, TID, Fritzi Mandes, MD, 200 mg at 06/28/21 0904   furosemide (LASIX) injection 40 mg, 40 mg, Intravenous, Q12H, Zada Finders R, MD, 40 mg at 06/27/21 2232   guaiFENesin-dextromethorphan (ROBITUSSIN DM) 100-10 MG/5ML syrup 10 mL, 10 mL, Oral, Q4H PRN, Fritzi Mandes, MD, 10 mL at 06/28/21 0904   LORazepam (ATIVAN) tablet 0.5 mg, 0.5 mg, Oral, QHS PRN, Judd Gaudier V, MD, 0.5 mg at 06/27/21 2223   MEDLINE mouth rinse, 15 mL, Mouth Rinse, BID, Posey Pronto, Vishal R, MD, 15 mL at 06/27/21 2224   melatonin tablet 5 mg, 5 mg, Oral, QHS, Patel, Vishal R, MD, 5 mg at 06/27/21 2222   methylPREDNISolone sodium succinate (SOLU-MEDROL) 40 mg/mL injection 40 mg, 40 mg, Intravenous, Daily, Lanney Gins, Priya Matsen, MD, 40 mg at 06/28/21 0903   metoprolol tartrate (LOPRESSOR) tablet 50 mg, 50 mg, Oral, BID, Zada Finders R, MD, 50 mg at 06/28/21 0903   montelukast (SINGULAIR)  tablet 10 mg, 10 mg, Oral, QHS, Patel, Vishal R, MD, 10 mg at 06/27/21 2223   ondansetron (ZOFRAN) tablet 4 mg, 4 mg, Oral, Q6H PRN **OR** ondansetron (ZOFRAN) injection 4 mg, 4 mg, Intravenous, Q6H PRN, Posey Pronto, Vishal R, MD   potassium chloride (KLOR-CON) packet 20 mEq, 20 mEq, Oral, Daily, Zada Finders R, MD, 20 mEq at 06/28/21 [phone]  senna-docusate (Senokot-S) tablet 1 tablet, 1 tablet, Oral, QHS PRN, Lenore Cordia, MD, 1 tablet at 06/28/21 0904   sodium chloride flush (NS) 0.9 % injection 3 mL, 3 mL, Intravenous, Q12H, Zada Finders R, MD, 3 mL at 06/27/21 2223   tamsulosin (FLOMAX) capsule 0.4 mg, 0.4 mg, Oral, Daily, Zada Finders R, MD, 0.4 mg at 06/28/21 0272    ALLERGIES   Indomethacin     REVIEW OF SYSTEMS    Review of Systems:  Gen:  Denies  fever, sweats, chills weigh loss  HEENT: Denies blurred vision, double vision, ear pain, eye pain, hearing loss, nose bleeds, sore throat Cardiac:  No dizziness, chest pain or heaviness, chest tightness,edema Resp:   Denies cough or sputum porduction, shortness of breath,wheezing, hemoptysis,  Gi: Denies swallowing difficulty, stomach pain, nausea or vomiting, diarrhea, constipation, bowel incontinence Gu:  Denies bladder incontinence, burning urine Ext:   Denies Joint pain, stiffness or swelling Skin: Denies  skin rash, easy bruising or bleeding or hives Endoc:  Denies polyuria, polydipsia , polyphagia or weight change Psych:   Denies depression, insomnia or hallucinations   Other:  All other systems negative   VS: BP (!) 158/92 (BP Location: Left Arm)   Pulse 91   Temp (!) 97.4 F (36.3 C) (Oral)   Resp 16   Ht _0  (1.727 m)   Wt 68.9 kg   SpO2 98%   BMI 23.08 kg/m      PHYSICAL EXAM    GENERAL:NAD, no fevers, chills, no weakness no fatigue HEAD: Normocephalic, atraumatic.  EYES: Pupils equal, round, reactive to light. Extraocular muscles intact. No scleral icterus.  MOUTH: Moist mucosal membrane. Dentition  intact. No abscess noted.  EAR, NOSE, THROAT: Clear without exudates. No external lesions.  NECK: Supple. No thyromegaly. No nodules. No JVD.  PULMONARY: mild rhonchi b/l CARDIOVASCULAR: S1 and S2. Regular rate and rhythm. No murmurs, rubs, or gallops. No edema. Pedal pulses 2+ bilaterally.  GASTROINTESTINAL: Soft, nontender, nondistended. No masses. Positive bowel sounds. No hepatosplenomegaly.  MUSCULOSKELETAL: No swelling, clubbing, or edema. Range of motion full in all extremities.  NEUROLOGIC: Cranial nerves II through XII are intact. No gross focal neurological deficits. Sensation intact. Reflexes intact.  SKIN: No ulceration, lesions, rashes, or cyanosis. Skin warm and dry. Turgor intact.  PSYCHIATRIC: Mood, affect within normal limits. The patient is awake, alert and oriented x 3. Insight, judgment intact.       IMAGING    CT Chest Wo Contrast  Result Date: 07/09/2021 CLINICAL DATA:  87 year old male with respiratory distress. EXAM: CT CHEST WITHOUT CONTRAST TECHNIQUE: Multidetector CT imaging of the chest was performed following the standard protocol without IV contrast. COMPARISON:  Chest radiograph dated 07/04/2021 and CT dated 01/20/2021. FINDINGS: Evaluation of this exam is limited in the absence of intravenous contrast. Cardiovascular: There is no cardiomegaly or pericardial effusion. Advanced 3 vessel coronary vascular calcification postsurgical changes of CABG. There is moderate atherosclerotic calcification of the thoracic aorta. No aneurysmal dilatation. The central pulmonary arteries are grossly unremarkable. Mediastinum/Nodes: No mediastinal adenopathy. Evaluation of hilar lymph nodes is very limited due to consolidative changes of the lung. The esophagus is grossly unremarkable. No mediastinal fluid collection. Lungs/Pleura: Large right pleural effusion, increased in size since the prior CT. There is near complete compressive atelectasis of the right middle and right lower  lobes versus pneumonia. There is partial compressive atelectasis of the right upper lobe. Several small nodular densities noted in the left upper lobe. There is no  pneumothorax. There is narrowing of the right upper lobe bronchus as well as near complete occlusion of the right middle and right lower lobe bronchi. Upper Abdomen: No acute abnormality. Musculoskeletal: Osteopenia with degenerative changes of the spine. Median sternotomy wires. No acute osseous pathology. Cervical ACDF. IMPRESSION: 1. Large right pleural effusion, increased in size since the prior CT. Thoracentesis may provide additional diagnostic value as well as symptomatic relief. 2. Near complete compressive atelectasis of the right middle and right lower lobes versus pneumonia. 3. Aortic Atherosclerosis (ICD10-I70.0). Electronically Signed   By: Anner Crete M.D.   On: 06/24/2021 23:05   DG Chest Port 1 View  Result Date: 06/26/2021 CLINICAL DATA:  Status post right thoracentesis EXAM: PORTABLE CHEST 1 VIEW COMPARISON:  06/20/2021 FINDINGS: Unchanged cardiomegaly. No pulmonary vascular congestion. Postsurgical changes of CABG again seen. Interval decrease of right pleural effusion. No pneumothorax. IMPRESSION: No pneumothorax status post right thoracentesis. Electronically Signed   By: Miachel Roux M.D.   On: 06/26/2021 14:55   DG Chest Portable 1 View  Result Date: 06/17/2021 CLINICAL DATA:  Shortness of breath cough EXAM: PORTABLE CHEST 1 VIEW COMPARISON:  CT 02/17/2021, radiograph 12/06/2020 FINDINGS: Chronic right pleural effusion appears similar to slightly increased from comparison radiograph 12/14/2020. Some increasing left basilar effusion is noted as well. More bandlike areas of opacity suggestive of atelectasis or scarring. There is diffuse hazy interstitial opacity with pulmonary vascular congestion as well as fissural thickening suggesting some superimposed pulmonary edema. Partial obscuration of the cardiomediastinal  contours. Visible contours are similar to comparison prior counting for differences in technique. Prior sternotomy and CABG. The aorta is calcified. The remaining cardiomediastinal contours are unremarkable. Degenerative changes are present in the imaged spine and shoulders. No other significant osseous abnormality. Telemetry leads overlie the chest. IMPRESSION: 1. Suspect some slight increase in size of a chronic right pleural effusion with increasing left basilar effusion as well. Additional features pulmonary vascular congestion and likely developing interstitial edema could suggest CHF/volume overload 2. Underlying airspace disease in the regions of more coalescent opacity is difficult to fully exclude. 3. Prior sternotomy and CABG. 4.  Aortic Atherosclerosis (ICD10-I70.0). Electronically Signed   By: Lovena Le M.D.   On: 06/22/2021 22:03   US THORACENTESIS ASP PLEURAL SPACE W/IMG GUIDE  Result Date: 06/26/2021 INDICATION: Acute respiratory distress Right pleural effusion EXAM: ULTRASOUND GUIDED RIGHT THORACENTESIS MEDICATIONS: None. COMPLICATIONS: None immediate. PROCEDURE: An ultrasound guided thoracentesis was thoroughly discussed with the patient and questions answered. The benefits, risks, alternatives and complications were also discussed. The patient understands and wishes to proceed with the procedure. Written consent was obtained. Ultrasound was performed to localize and mark an adequate pocket of fluid in the right chest. The area was then prepped and draped in the normal sterile fashion. 1% Lidocaine was used for local anesthesia. Under ultrasound guidance a 6 Fr Safe-T-Centesis catheter was introduced. Thoracentesis was performed. The catheter was removed and a dressing applied. FINDINGS: A total of approximately 1.1 L of bloody fluid was removed. Samples were sent to the laboratory as requested by the clinical team. IMPRESSION: Successful ultrasound guided right thoracentesis yielding 1.1 L  of pleural fluid. Electronically Signed   By: Miachel Roux M.D.   On: 06/26/2021 14:48      ASSESSMENT/PLAN   Acute on chronic hypoxemic respiratory failure              -Due to complete atelectasis secondary to large right pleural effusion               -  this may be due to CHF as his BNP was slightly elevated                - he had throacentesis with imporvement    Right pleural effusion -moderate to large  S/p thoracentesis - fluid profile with bloodly pleural fluid possibly due to eliquis BID          - patient will need repeat imaging and may need repeat CT chest and may need pigtail catheter drainage          - he has personal hx of cancer and advanced copd with increased risk for carcinoma - cytology is in process          -glucose is 90 which point away from infectious etiology but pH was not done   Advanced COPD with chronic hypoxemia     -agree with solumedrol - will decrease to 40 daily      - will start xopenex and dc remainder of his nebulizer therapy     Complete atelectasis of right lung    - this may be too large for incentive spirometry     - will upgrade to metaneb daily with saline     Thank you for allowing me to participate in the care of this patient.   Patient/Family are satisfied with care plan and all questions have been answered.   This document was prepared using Dragon voice recognition software and may include unintentional dictation errors.     Ottie Glazier, M.D.  Division of [place]

## 2021-06-28 NOTE — Progress Notes (Signed)
Pt refused Metaneb tx. Pt states he had a very bad spell after using it this A.M. and is too nervous to use it this evening.

## 2021-06-28 NOTE — Evaluation (Signed)
Occupational Therapy Evaluation Patient Details Name: James [name] MRN: [id number] DOB: [birth year] Today's Date: 06/28/2021    History of Present Illness James [name] is a 87 y.o. male with a history of CAD, COPD, hypertension, paroxysmal atrial fibrillation who comes ED complaining of worsening shortness of breath and productive cough for the past week.  He uses 2 L nasal cannula at home as needed.  Today, on 3 L his oxygen saturation was 85%.  EMS brings him to the ED on nonrebreather. Currently on 5L with sats at 94%.   Clinical Impression   Patient presenting with decreased I in self care, balance, functional mobility/transfers, endurance, and safety awareness. Patient reports living at home with wife PTA independently. Pt has SPC and RW but does not use AD at baseline. He is on 5L O2 this session and endorses 2L at night baseline. Patient seated on [facility] with NT present when therapist enters the room. Pt having BM and needs assistance with clothing management and hygiene. Pt reports feeling very fatigued and SOB. Vitals WNLs. OT called and notified RN. Pt reports use of rescue inhaler at home and RN notified. Pt ambulates back to recliner chair with min HHA and requests to remain in chair. Patient will benefit from acute OT to increase overall independence in the areas of ADLs, functional mobility, and safety awareness in order to safely discharge to next venue of care.     Follow Up Recommendations  SNF;Supervision - Intermittent    Equipment Recommendations  3 in 1 bedside commode;Tub/shower bench       Precautions / Restrictions Precautions Precautions: Fall Precaution Comments: Relies on furniture in room intermittently to walk with no AD Restrictions Weight Bearing Restrictions: No      Mobility Bed Mobility               General bed mobility comments: Pt seated in recliner chair    Transfers Overall transfer level: Needs assistance Equipment used: 1 person hand held  assist Transfers: Sit to/from Stand;Stand Pivot Transfers Sit to Stand: Min guard Stand pivot transfers: Min assist       General transfer comment: Pt reports SOB with self care this session and needing increased assistance for safety    Balance Overall balance assessment: Needs assistance Sitting-balance support: No upper extremity supported;Feet supported Sitting balance-Leahy Scale: Fair       Standing balance-Leahy Scale: Fair Standing balance comment: UE support needed                           ADL either performed or assessed with clinical judgement   ADL Overall ADL's : Needs assistance/impaired     Grooming: Wash/dry hands;Wash/dry face;Sitting;Set up                   Toilet Transfer: Minimal assistance;BSC   Toileting- Clothing Manipulation and Hygiene: Moderate assistance;Sitting/lateral lean Toileting - Clothing Manipulation Details (indicate cue type and reason): assistance for clothing management and hygiene     Functional mobility during ADLs: Minimal assistance General ADL Comments: HH min A for short distance ambulation     Vision Patient Visual Report: No change from baseline              Pertinent Vitals/Pain Pain Assessment: No/denies pain     Hand Dominance Right   Extremity/Trunk Assessment Upper Extremity Assessment Upper Extremity Assessment: Generalized weakness   Lower Extremity Assessment Lower Extremity Assessment: Generalized weakness   Cervical /  Trunk Assessment Cervical / Trunk Assessment: Kyphotic   Communication Communication Communication: No difficulties;HOH   Cognition Arousal/Alertness: Awake/alert Behavior During Therapy: WFL for tasks assessed/performed Overall Cognitive Status: Within Functional Limits for tasks assessed                                        Exercises Other Exercises Other Exercises: Education on not utilizing surfaces in room for stability due to  increased risk of falls.        Home Living Family/patient expects to be discharged to:: Private residence Living Arrangements: Spouse/significant other Available Help at Discharge: Family;Available 24 hours/day Type of Home: House Home Access: Stairs to enter [employer] of Steps: 3 Entrance Stairs-Rails: Can reach both Home Layout: One level     Bathroom Shower/Tub: Teacher, early years/pre: Standard Bathroom Accessibility: Yes   Home Equipment: Cane - single point;Walker - 2 wheels   Additional Comments: Wishes for grab bars and shower seat for returning home and BSC.      Prior Functioning/Environment Level of Independence: Independent        Comments: Pt reports being independent with all functional mobility and ADL's. Has wife at home who can assist as needed.        OT Problem List: Decreased strength;Decreased activity tolerance;Impaired balance (sitting and/or standing);Decreased knowledge of use of DME or AE;Decreased safety awareness;Cardiopulmonary status limiting activity      OT Treatment/Interventions: Self-care/ADL training;Modalities;Balance training;Therapeutic exercise;Therapeutic activities;Energy conservation;DME and/or AE instruction;Patient/family education;Manual therapy    OT Goals(Current goals can be found in the care plan section) Acute Rehab OT Goals Patient Stated Goal: to go home OT Goal Formulation: With patient Time For Goal Achievement: 07/12/21 Potential to Achieve Goals: Fair ADL Goals Pt Will Perform Grooming: with modified independence;standing Pt Will Perform Lower Body Dressing: with supervision;sit to/from stand Pt Will Transfer to Toilet: with supervision;ambulating Pt Will Perform Toileting - Clothing Manipulation and hygiene: with supervision;sit to/from stand  OT Frequency: Min 2X/week   Barriers to D/C:    none known at this time          AM-PAC OT "6 Clicks" Daily Activity     Outcome  Measure Help from another person eating meals?: None Help from another person taking care of personal grooming?: A Little Help from another person toileting, which includes using toliet, bedpan, or urinal?: A Lot Help from another person bathing (including washing, rinsing, drying)?: A Lot Help from another person to put on and taking off regular upper body clothing?: A Little Help from another person to put on and taking off regular lower body clothing?: A Lot 6 Click Score: 16   End of Session Equipment Utilized During Treatment: Oxygen (5Ls) Nurse Communication: Mobility status;Other (comment) (Pt verbalizing SOB and requesting breathing treatment or rescue inhaler)  Activity Tolerance: Patient limited by fatigue Patient left: in chair;with call bell/phone within reach;with nursing/sitter in room  OT Visit Diagnosis: Unsteadiness on feet (R26.81);Muscle weakness (generalized) (M62.81)                Time: 6195-0932 OT Time Calculation (min): 18 min Charges:  OT General Charges $OT Visit: 1 Visit OT Evaluation $OT Eval Moderate Complexity: 1 Mod OT Treatments $Self Care/Home Management : 8-22 mins  Darleen Crocker, MS, OTR/L , CBIS ascom [phone]  06/28/21, 12:02 PM

## 2021-06-28 NOTE — Progress Notes (Signed)
1:1 safety sitter d/c'd. Pt educated on importance of calling staff for assistance. Chair alarm in place. Will continue to monitor.

## 2021-06-28 NOTE — Progress Notes (Signed)
[facility] [place] NAME: James [name]    MR#:  [id number]  DATE OF BIRTH:  Feb 09, 1929  SUBJECTIVE:  sitting out in the chair. Feels short winded. Sats 94 to 95% on 4 L nasal cannula. Reduces chronic 2 to 3 L nasal cannula oxygen. Tolerating PO diet. No family in the room.  REVIEW OF SYSTEMS:   Review of Systems  Constitutional:  Negative for chills, fever and weight loss.  HENT:  Negative for ear discharge, ear pain and nosebleeds.   Eyes:  Negative for blurred vision, pain and discharge.  Respiratory:  Positive for shortness of breath. Negative for sputum production, wheezing and stridor.   Cardiovascular:  Negative for chest pain, palpitations, orthopnea and PND.  Gastrointestinal:  Negative for abdominal pain, diarrhea, nausea and vomiting.  Genitourinary:  Negative for frequency and urgency.  Musculoskeletal:  Negative for back pain and joint pain.  Neurological:  Positive for weakness. Negative for sensory change, speech change and focal weakness.  Psychiatric/Behavioral:  Negative for depression and hallucinations. The patient is not nervous/anxious.   Tolerating Diet:yes Tolerating PT: rehab  DRUG ALLERGIES:   Allergies  Allergen Reactions   Indomethacin Nausea Only    VITALS:  Blood pressure 136/71, pulse 76, temperature 97.8 F (36.6 C), temperature source Oral, resp. rate 20, height 5\' 8"  (1.727 m), weight 68.9 kg, SpO2 97 %.  PHYSICAL EXAMINATION:   Physical Exam  GENERAL:  87 y.o.-year-old patient lying in the bed with no acute distress. Chronically ill and fatigued LUNGS: decreasedbreath sounds bilaterally, no wheezing, rales, rhonchi. No use of accessory muscles of respiration.  CARDIOVASCULAR: S1, S2 normal. No murmurs, rubs, or gallops.  ABDOMEN: Soft, nontender, nondistended. Bowel sounds present. No organomegaly or mass.  EXTREMITIES: No cyanosis, clubbing or edema b/l.    NEUROLOGIC:non focal,  weak PSYCHIATRIC:  patient is alert and oriented x 2.  SKIN: No obvious rash, lesion, or ulcer.   LABORATORY PANEL:  CBC Recent Labs  Lab 06/27/21 0536  WBC 6.0  HGB 15.7  HCT 48.1  PLT 149*    Chemistries  Recent Labs  Lab 06/26/21 0502 06/27/21 0536 06/28/21 0839  NA 139   < > 140  K 3.9   < > 3.9  CL 95*   < > 94*  CO2 32   < > 39*  GLUCOSE 149*   < > 124*  BUN 28*   < > 39*  CREATININE 1.51*   < > 1.26*  CALCIUM 8.4*   < > 8.6*  MG 2.1  --   --   AST 43*  --   --   ALT 32  --   --   ALKPHOS 73  --   --   BILITOT 0.8  --   --    < > = values in this interval not displayed.   Cardiac Enzymes No results for input(s): TROPONINI in the last 168 hours. RADIOLOGY:  DG Chest Port 1 View  Result Date: 06/28/2021 CLINICAL DATA:  87 year old male with cough. Right side empyema status post right side thoracentesis two days ago. EXAM: PORTABLE CHEST 1 VIEW COMPARISON:  Portable chest 06/26/2021 and earlier. FINDINGS: Portable AP upright view at 0939 hours. Right lung ventilation remains substantially improved compared to the CT on 06/28/2021, with possible small volume residual effusion at the right lung base. No pneumothorax or pulmonary edema. Stable cardiac size and mediastinal contours. Prior CABG. Stable and negative left lung. Negative  visible bowel gas pattern. Stable visualized osseous structures. IMPRESSION: 1. Substantially improved right lung ventilation following thoracentesis two days ago with possible small residual right pleural effusion. 2. No new cardiopulmonary abnormality. Electronically Signed   By: Genevie Ann M.D.   On: 06/28/2021 10:15   ASSESSMENT AND PLAN:  87 y.o. male with medical history significant for CAD s/p CABG, HFrEF (EF 40%), PAF on Eliquis, COPD on chronic prednisone 5 mg daily, RLL lung adenocarcinoma s/p radiation therapy, history of CVA, CKD stage IIIa, HTN, HLD, and OSA (not using CPAP, on 2-3 L O2 via [place] at night) admitted for acute hypoxic  respiratory failure likely due to large right-sided pleural effusion.  Acute respiratory failure with hypoxia secondary to large right pleural effusion associated with compressive atelectasis of right middle and right lower lobes: --Large right pleural effusion seen on CT imaging.  Has history of right lower lobe adenocarcinoma. -Continue IV Lasix 40 mg twice daily -Continue supplemental O2, oxygen weaned to 4 L (from 6 L) s/p thoracentesis on 7/10 -Resume Eliquis -S/p thoracentesis and removal of 1.1 L of bloody fluid.  Pending fluid studies -Consult with pulmonary Dr Lanney Gins appreciated   Acute on chronic HFrEF: Last EF 40%.  Pleural effusion could be related to HFrEF versus malignancy as above. -Continue IV Lasix 40 mg twice daily, Lopressor 50 mg twice daily -Continue potassium supplement -Strict I/O's and daily weights Net IO Since Admission: -548.96 mL [06/27/21 1456]  -Cardiology consult with dr Ubaldo Glassing noted   COPD with acute exacerbation: -Continue IV Solu-Medrol 40 mg twice daily, scheduled DuoNebs with as needed albuterol, Pulmicort and Singulair -Continue azithromycin for 5 days -Continue supplemental O2 and wean as able   AKI on CKD stage IIIa: Suspect related to vascular congestion and acute hypoxic respiratory failure.  Monitor response to diuresis as above.  Creatinine 1.1 today   Paroxysmal atrial fibrillation: In sinus rhythm with controlled rate on admission.   --Eliquis restarted on 7/11.   --Continue Lopressor for rate control.   CAD s/p CABG: Denies any chest pain.   --Continue Lopressor, statin.   Hypertension: Currently stable.  Continue Lopressor and IV Lasix.   History of CVA: Continue statin.  Eliquis on hold as above.   Hyperlipidemia: --Continue atorvastatin.   OSA: Follows with pulmonology, BiPAP was recommended but he is not using it.  Instead uses 2-3 L of O2 via [place] at night.  Continue supplemental O2, BiPAP if tolerated. Consider BiPAP at  night or during sleep if need due to hypoxia while here   Generalized weakness PT, OT consult--recommends rehab  Procedures: Family communication :none tdoay Consults :cards and pulm CODE STATUS: DNR DVT Prophylaxis :eliquis Level of care: Progressive Cardiac Status is: Inpatient  Remains inpatient appropriate because:Ongoing diagnostic testing needed not appropriate for outpatient work up  Dispo: The patient is from: Home              Anticipated d/c is to: SNF              Patient currently is not medically stable to d/c.   Difficult to place patient No  patient overall clinically improving slowly. Hopefully should be able to discharge once rehab bed opens up in a day or two.      TOTAL TIME TAKING CARE OF THIS PATIENT: 25 minutes.  >50% time spent on counselling and coordination of care  Note: This dictation was prepared with Dragon dictation along with smaller phrase technology. Any transcriptional errors that result from  this process are unintentional.  Fritzi Mandes M.D    [facility]   CC: Primary care physician; Birdie Sons, MD Patient ID: [name], male   DOB: November 07, 1929, 87 y.o.   MRN: [id number]

## 2021-06-28 NOTE — Progress Notes (Signed)
Pt c/o SOb after working with OT/o2 sats 94% on 4L/Md made aware/new orders for PRN duonebs. Will administer and continue to monitor.

## 2021-06-28 NOTE — TOC Progression Note (Signed)
Transition of Care [facility]) - Progression Note    Patient Details  Name: James [name] MRN: [id number] Date of Birth: 1929-01-21  Transition of Care [facility]) CM/SW [place], [place] Phone Number: 06/28/2021, 2:50 PM  Clinical Narrative:     CSW spoke with patient and son at bedside regarding SNF recommendation. Both in agreement with patient going to SNF, asked CSW to call patient spouse.   CSW updated patient's spouse James [name] with SNF rec, would refer [facility], second choice [facility].   CSW has faxed referrals pending bed offers at this time.   Expected Discharge Plan: Skilled Nursing Facility Barriers to Discharge: Continued Medical Work up  Expected Discharge Plan and Services Expected Discharge Plan: [place] Choice: [place] arrangements for the past 2 months: Single Family Home                                       Social Determinants of Health (SDOH) Interventions    Readmission Risk Interventions Readmission Risk Prevention Plan 06/27/2021  Transportation Screening Complete  PCP or Specialist Appt within 3-5 Days Complete  HRI or [place] Complete  Social Work Consult for [place] Planning/Counseling Complete  Palliative Care Screening Not Applicable  Medication Review Press photographer) Complete  Some recent data might be hidden

## 2021-06-29 ENCOUNTER — Inpatient Hospital Stay: Payer: Medicare Other

## 2021-06-29 ENCOUNTER — Other Ambulatory Visit: Payer: Self-pay | Admitting: Pulmonary Disease

## 2021-06-29 DIAGNOSIS — J9601 Acute respiratory failure with hypoxia: Secondary | ICD-10-CM | POA: Diagnosis not present

## 2021-06-29 LAB — CYTOLOGY - NON PAP

## 2021-06-29 LAB — ACID FAST SMEAR (AFB, MYCOBACTERIA): Acid Fast Smear: NEGATIVE

## 2021-06-29 MED ORDER — FLUTICASONE PROPIONATE 50 MCG/ACT NA SUSP
2.0000 | Freq: Every day | NASAL | Status: DC
  Administered 2021-06-29: 2 via NASAL
  Filled 2021-06-29: qty 16

## 2021-06-29 MED ORDER — OMEGA-3-ACID ETHYL ESTERS 1 G PO CAPS
1.0000 g | ORAL_CAPSULE | Freq: Every day | ORAL | Status: DC
  Administered 2021-06-29: 1 g via ORAL
  Filled 2021-06-29: qty 1

## 2021-06-29 MED ORDER — MAGNESIUM OXIDE -MG SUPPLEMENT 400 (240 MG) MG PO TABS
400.0000 mg | ORAL_TABLET | Freq: Every day | ORAL | Status: DC
  Administered 2021-06-29: 400 mg via ORAL
  Filled 2021-06-29: qty 1

## 2021-06-29 MED ORDER — ADULT MULTIVITAMIN W/MINERALS CH
1.0000 | ORAL_TABLET | Freq: Every day | ORAL | Status: DC
  Administered 2021-06-29: 1 via ORAL
  Filled 2021-06-29: qty 1

## 2021-06-29 MED ORDER — APIXABAN 5 MG PO TABS: 5.0000 mg | ORAL_TABLET | Freq: Two times a day (BID) | ORAL | Status: DC

## 2021-06-29 MED ORDER — DILTIAZEM HCL 25 MG/5ML IV SOLN
10.0000 mg | Freq: Once | INTRAVENOUS | Status: AC
  Administered 2021-06-29: 10 mg via INTRAVENOUS
  Filled 2021-06-29: qty 5

## 2021-06-29 MED ORDER — TORSEMIDE 20 MG PO TABS: 20.0000 mg | ORAL_TABLET | Freq: Every day | ORAL | Status: DC

## 2021-06-29 MED ORDER — DILTIAZEM LOAD VIA INFUSION
10.0000 mg | Freq: Once | INTRAVENOUS | Status: DC
  Filled 2021-06-29: qty 10

## 2021-06-29 MED ORDER — PREDNISONE 20 MG PO TABS: 20.0000 mg | ORAL_TABLET | Freq: Every day | ORAL | Status: DC

## 2021-06-29 MED ORDER — PANTOPRAZOLE SODIUM 20 MG PO TBEC
20.0000 mg | DELAYED_RELEASE_TABLET | Freq: Every day | ORAL | Status: DC
  Administered 2021-06-29: 20 mg via ORAL
  Filled 2021-06-29 (×2): qty 1

## 2021-06-29 MED ORDER — FUROSEMIDE 40 MG PO TABS: 40.0000 mg | ORAL_TABLET | Freq: Every day | ORAL | Status: DC

## 2021-06-29 MED ORDER — PREDNISONE 10 MG PO TABS: 5.0000 mg | ORAL_TABLET | Freq: Every day | ORAL | Status: DC

## 2021-06-29 MED ORDER — LORAZEPAM 1 MG PO TABS: 1.0000 mg | ORAL_TABLET | Freq: Every day | ORAL | Status: DC

## 2021-06-29 MED ORDER — LORAZEPAM 0.5 MG PO TABS
0.5000 mg | ORAL_TABLET | Freq: Three times a day (TID) | ORAL | Status: DC | PRN
  Administered 2021-06-29 – 2021-06-30 (×2): 0.5 mg via ORAL
  Filled 2021-06-29: qty 1

## 2021-06-29 NOTE — Progress Notes (Signed)
Patient is refusing to wear tele box. MD is aware. Will try to place it back on patient later on as soon as he lets [name].

## 2021-06-29 NOTE — Progress Notes (Signed)
Physical Therapy Treatment Patient Details Name: James [name] MRN: [id number] DOB: 04-27-1929 Today's Date: 06/29/2021    History of Present Illness James [name] is a 87 y.o. male with a history of CAD, COPD, hypertension, paroxysmal atrial fibrillation who comes ED complaining of worsening shortness of breath and productive cough for the past week.  He uses 2 L nasal cannula at home as needed.  Today, on 3 L his oxygen saturation was 85%.  EMS brings him to the ED on nonrebreather. Currently on 5L with sats at 94%.    PT Comments    Pt received seated in recliner agreeable to PT services. Pt on 4L/min [place] with O2 sats remaining 96-97%. Supervision to STS to RW. Ambulated with improved stability compared to previous session with no AD, with no sway or imbalance with RW. Amb 50' with noted increased SOB but O2 sats remaining > 90% throughout mobility. Safe t/f and sequencing of RW and hand placement returning to recliner. Remains significantly SOB and required 2 standing rest breaks during walking bout of 40'. 10 minute conversation with pt and son ensued on reasoning for d/c rec of STR to improve pt's overall endurance and strength to optimize safe return to home. Current d/c recs appropriate at this time.    Follow Up Recommendations  SNF     Equipment Recommendations  3in1 (PT);Other (comment)    Recommendations for Other Services       Precautions / Restrictions Precautions Precautions: Fall Precaution Comments: Relies on furniture in room intermittently to walk with no AD Restrictions Weight Bearing Restrictions: No    Mobility  Bed Mobility               General bed mobility comments: Pt seated in recliner chair    Transfers Overall transfer level: Modified independent Equipment used: Rolling walker (2 wheeled) Transfers: Sit to/from Stand Sit to Stand: Min guard            Ambulation/Gait Ambulation/Gait assistance: Min guard Gait Distance (Feet): 30  Feet Assistive device: Rolling walker (2 wheeled) Gait Pattern/deviations: Trunk flexed;Narrow base of support;Shuffle         Stairs             Wheelchair Mobility    Modified Rankin (Stroke Patients Only)       Balance Overall balance assessment: Needs assistance Sitting-balance support: No upper extremity supported;Feet supported Sitting balance-Leahy Scale: Fair       Standing balance-Leahy Scale: Fair Standing balance comment: UE support needed                            Cognition Arousal/Alertness: Awake/alert Behavior During Therapy: WFL for tasks assessed/performed Overall Cognitive Status: Within Functional Limits for tasks assessed                                        Exercises      General Comments        Pertinent Vitals/Pain Pain Assessment: No/denies pain    Home Living                      Prior Function            PT Goals (current goals can now be found in the care plan section) Acute Rehab PT Goals Patient Stated Goal: to go home  PT Goal Formulation: With patient Time For Goal Achievement: 07/12/21 Potential to Achieve Goals: Good Progress towards PT goals: Progressing toward goals    Frequency    Min 2X/week      PT Plan      Co-evaluation              AM-PAC PT "6 Clicks" Mobility   Outcome Measure  Help needed turning from your back to your side while in a flat bed without using bedrails?: A Lot Help needed moving from lying on your back to sitting on the side of a flat bed without using bedrails?: A Lot Help needed moving to and from a bed to a chair (including a wheelchair)?: A Little Help needed standing up from a chair using your arms (e.g., wheelchair or bedside chair)?: A Little Help needed to walk in hospital room?: A Little Help needed climbing 3-5 steps with a railing? : A Lot 6 Click Score: 15    End of Session Equipment Utilized During Treatment: Gait  belt;Oxygen Activity Tolerance: Patient tolerated treatment well;Patient limited by fatigue Patient left: in chair;with call bell/phone within reach;with nursing/sitter in room;with family/visitor present Nurse Communication: Mobility status PT Visit Diagnosis: Unsteadiness on feet (R26.81);Other abnormalities of gait and mobility (R26.89);Muscle weakness (generalized) (M62.81)     Time: 7654-6503 PT Time Calculation (min) (ACUTE ONLY): 27 min  Charges:  $Gait Training: 8-22 mins                     Salem Caster. Fairly IV, PT, DPT Physical Therapist- [facility]  06/29/2021, 4:16 PM

## 2021-06-29 NOTE — Care Management Important Message (Signed)
Important Message  Patient Details  Name: James [name] MRN: [id number] Date of Birth: 04-26-1929   Medicare Important Message Given:  Yes     Dannette Barbara 06/29/2021, 10:17 AM

## 2021-06-29 NOTE — Progress Notes (Addendum)
Pulmonary Medicine          Date: 06/29/2021,   MRN# [id number] James [name] [birth year]     AdmissionWeight: 81.2 kg                 CurrentWeight: 71.5 kg   Referring physician: Dr Manuella Ghazi.   CHIEF COMPLAINT:   Acute hypoxemic respiratory failure   HISTORY OF PRESENT ILLNESS   This is a pleasant 87 yo male with hx of CAD, AF, CABG, Advanced COPD on O2 at home, hx of prostate CA lives with wife at home.  He shares that he was in [place] and was able to get booster shot for COVID.  He felt unwell after this with increased O2 requirement and worsening dyspnea.  He was admitted for this and had right pleural effusion drained with bloody fluid on aspirate.  He remains with dyspnea and PCCM consulted for further evaluation and management.   06/28/21-  patient is improved.  He had PT and will need additional rehab on outpatient.  He feels breathing is better.  CXR done this AM looks reassuring with minimal residual pleural fluid. Cardiac status much improved.  There is mild aki this am. Recommend d/c planning patient is close to baseline.   06/29/21- patient is improved and is close to baseline, he may be dcd home with outpatient follow up. Home health with PT is needed at minimum.  Please continue prednisone 63m as per home regimen and home zithromax.   PAST MEDICAL HISTORY   Past Medical History:  Diagnosis Date   Arthritis    Back pain    CAD (coronary artery disease)    CAP (community acquired pneumonia) 11/06/2017   Cataract    right   COPD (chronic obstructive pulmonary disease) ([place]    SPiriva and SYmbicort daily. Albuterol as needed   Diverticulosis    Dyspnea    with exertion   Enlarged prostate    takes Flomax daily   GERD (gastroesophageal reflux disease)    History of chicken pox    History of gout    History of measles    History of mumps    HOH (hard of hearing)    Hyperglycemia    Hyperlipidemia    takes Atorvastatin daily   Hypertension     takes Metoprolol daily as well as Lotensin HCT   Hypotension    Joint pain    Microscopic colitis    PAF ([place]oxysmal atrial fibrillation) [place] 03/14/2017   Pneumonia    Prostate cancer ([place]    [place]roke ([facility]    TIA   TIA (transient ischemic attack)    Weakness    numbness and tingling.mainly on right     SURGICAL HISTORY   Past Surgical History:  Procedure Laterality Date   ANTERIOR CERVICAL DECOMP/DISCECTOMY FUSION N/A 03/07/2017   Procedure: ANTERIOR CERVICAL DECOMPRESSION/DISCECTOMY FUSION CERVICAL THREE- CERVICAL FOUR, CERVICAL FOUR- CERVICAL FIVE;  Surgeon: JNewman Pies MD;  [facility]  Service: Neurosurgery;  Laterality: N/A;  ANTERIOR CERVICAL DECOMPRESSION/DISCECTOMY FUSION CERVICAL 3- CERVICAL 4, CERVIACL 4- CERVICAL 5   APPENDECTOMY  1950   CARDIAC CATHETERIZATION  05/29/2013   EF=40-45%. Moderate pulmonary hypertension. Infero/ lateral hypokinesis   cataract surgery Left    COLONOSCOPY     CORONARY ARTERY BYPASS GRAFT  2011   x 5   ESOPHAGOGASTRODUODENOSCOPY (EGD) WITH PROPOFOL N/A 07/01/2018   Procedure: ESOPHAGOGASTRODUODENOSCOPY (EGD) WITH PROPOFOL;  Surgeon: VLin Landsman MD;  [place] [facility]  Service: Gastroenterology;  Laterality: N/A;   EYE SURGERY     FLEXIBLE BRONCHOSCOPY N/A 11/19/2017   Procedure: FLEXIBLE BRONCHOSCOPY;  Surgeon: Laverle Hobby, MD;  Location: [facility];  Service: Pulmonary;  Laterality: N/A;   MRI BRAIN  06/07/2013   Mild chronic involutional changes. No acute abnormalities   MRI of neck     Myocardial Perfusion Scan  05/29/2013   Abnormal myocardial perfusion image consistent with myocardial infarction   PROSTATE SURGERY  11/18/2012   radiation seed   TOTAL HIP ARTHROPLASTY Left 1997   hip joint replacement     FAMILY HISTORY   Family History  Problem Relation Age of Onset   Heart attack Mother    Stroke Father    Heart attack Father    Hypertension Father    Heart attack Sister    Bladder  Cancer Brother    Kidney cancer Brother    Heart Problems Brother      SOCIAL HISTORY   Social History   Tobacco Use   Smoking status: Former    Packs/day: 1.00    Years: 11.00    Pack years: 11.00    Types: Cigarettes    Quit date: 02/12/1984    Years since quitting: 37.4   Smokeless tobacco: Never   Tobacco comments:    quit smoking 30 yrs ago  Vaping Use   Vaping Use: Never used  Substance Use Topics   Alcohol use: Yes    Alcohol/week: 1.0 - 2.0 standard drink    Types: 1 - 2 Cans of beer per week   Drug use: No     MEDICATIONS    Home Medication:    Current Medication:  Current Facility-Administered Medications:    0.9 %  sodium chloride infusion, , Intravenous, PRN, Max Sane, MD, Stopped at 06/26/21 1416   acetaminophen (TYLENOL) tablet 650 mg, 650 mg, Oral, Q6H PRN, 650 mg at 06/28/21 0903 **OR** acetaminophen (TYLENOL) suppository 650 mg, 650 mg, Rectal, Q6H PRN, Posey Pronto, Vishal R, MD   albuterol (PROVENTIL) (2.5 MG/3ML) 0.083% nebulizer solution 3 mL, 3 mL, Inhalation, Q6H PRN, Fritzi Mandes, MD   apixaban (ELIQUIS) tablet 5 mg, 5 mg, Oral, BID, Manuella Ghazi, Vipul, MD, 5 mg at 06/28/21 2036   atorvastatin (LIPITOR) tablet 80 mg, 80 mg, Oral, Daily, Zada Finders R, MD, 80 mg at 06/28/21 1707   azithromycin (ZITHROMAX) tablet 250 mg, 250 mg, Oral, Q M,W,F, Posey Pronto, Sona, MD   benzonatate (TESSALON) capsule 200 mg, 200 mg, Oral, TID, Fritzi Mandes, MD, 200 mg at 06/28/21 2035   furosemide (LASIX) injection 40 mg, 40 mg, Intravenous, Q12H, Patel, Vishal R, MD, 40 mg at 06/28/21 2348   guaiFENesin-dextromethorphan (ROBITUSSIN DM) 100-10 MG/5ML syrup 10 mL, 10 mL, Oral, Q4H PRN, Fritzi Mandes, MD, 10 mL at 06/28/21 0904   ipratropium-albuterol (DUONEB) 0.5-2.5 (3) MG/3ML nebulizer solution 3 mL, 3 mL, Nebulization, Q4H PRN, Fritzi Mandes, MD, 3 mL at 06/29/21 0325   levalbuterol (XOPENEX) nebulizer solution 1.25 mg, 1.25 mg, Nebulization, TID, Fritzi Mandes, MD, 1.25 mg at 06/29/21  0751   LORazepam (ATIVAN) tablet 0.5 mg, 0.5 mg, Oral, QHS PRN, Judd Gaudier V, MD, 0.5 mg at 06/28/21 2036   MEDLINE mouth rinse, 15 mL, Mouth Rinse, BID, Zada Finders R, MD, 15 mL at 06/28/21 2037   melatonin tablet 5 mg, 5 mg, Oral, QHS, Patel, Vishal R, MD, 5 mg at 06/28/21 2035   methylPREDNISolone sodium succinate (SOLU-MEDROL) 40 mg/mL injection 40 mg, 40 mg, Intravenous, Daily, Anthonie Lotito,  Luvenia Heller, MD, 40 mg at 06/28/21 0903   metoprolol tartrate (LOPRESSOR) tablet 50 mg, 50 mg, Oral, BID, Zada Finders R, MD, 50 mg at 06/28/21 2117   montelukast (SINGULAIR) tablet 10 mg, 10 mg, Oral, QHS, Zada Finders R, MD, 10 mg at 06/28/21 2036   ondansetron (ZOFRAN) tablet 4 mg, 4 mg, Oral, Q6H PRN **OR** ondansetron (ZOFRAN) injection 4 mg, 4 mg, Intravenous, Q6H PRN, Posey Pronto, Vishal R, MD   potassium chloride (KLOR-CON) packet 20 mEq, 20 mEq, Oral, Daily, Zada Finders R, MD, 20 mEq at 06/28/21 7035   senna-docusate (Senokot-S) tablet 1 tablet, 1 tablet, Oral, QHS PRN, Lenore Cordia, MD, 1 tablet at 06/28/21 0904   sodium chloride flush (NS) 0.9 % injection 3 mL, 3 mL, Intravenous, Q12H, Zada Finders R, MD, 3 mL at 06/28/21 2037   tamsulosin (FLOMAX) capsule 0.4 mg, 0.4 mg, Oral, Daily, Zada Finders R, MD, 0.4 mg at 06/28/21 0093    ALLERGIES   Indomethacin     REVIEW OF SYSTEMS    Review of Systems:  Gen:  Denies  fever, sweats, chills weigh loss  HEENT: Denies blurred vision, double vision, ear pain, eye pain, hearing loss, nose bleeds, sore throat Cardiac:  No dizziness, chest pain or heaviness, chest tightness,edema Resp:   Denies cough or sputum porduction, shortness of breath,wheezing, hemoptysis,  Gi: Denies swallowing difficulty, stomach pain, nausea or vomiting, diarrhea, constipation, bowel incontinence Gu:  Denies bladder incontinence, burning urine Ext:   Denies Joint pain, stiffness or swelling Skin: Denies  skin rash, easy bruising or bleeding or hives Endoc:  Denies  polyuria, polydipsia , polyphagia or weight change Psych:   Denies depression, insomnia or hallucinations   Other:  All other systems negative   VS: BP (!) 150/97 (BP Location: Left Arm)   Pulse (!) 104   Temp 97.9 F (36.6 C) (Oral)   Resp 18   Ht _0  (1.727 m)   Wt 71.5 kg   SpO2 96%   BMI 23.96 kg/m      PHYSICAL EXAM    GENERAL:NAD, no fevers, chills, no weakness no fatigue HEAD: Normocephalic, atraumatic.  EYES: Pupils equal, round, reactive to light. Extraocular muscles intact. No scleral icterus.  MOUTH: Moist mucosal membrane. Dentition intact. No abscess noted.  EAR, NOSE, THROAT: Clear without exudates. No external lesions.  NECK: Supple. No thyromegaly. No nodules. No JVD.  PULMONARY: mild rhonchi b/l CARDIOVASCULAR: S1 and S2. Regular rate and rhythm. No murmurs, rubs, or gallops. No edema. Pedal pulses 2+ bilaterally.  GASTROINTESTINAL: Soft, nontender, nondistended. No masses. Positive bowel sounds. No hepatosplenomegaly.  MUSCULOSKELETAL: No swelling, clubbing, or edema. Range of motion full in all extremities.  NEUROLOGIC: Cranial nerves II through XII are intact. No gross focal neurological deficits. Sensation intact. Reflexes intact.  SKIN: No ulceration, lesions, rashes, or cyanosis. Skin warm and dry. Turgor intact.  PSYCHIATRIC: Mood, affect within normal limits. The patient is awake, alert and oriented x 3. Insight, judgment intact.       IMAGING    CT Chest Wo Contrast  Result Date: 07/08/2021 CLINICAL DATA:  87 year old male with respiratory distress. EXAM: CT CHEST WITHOUT CONTRAST TECHNIQUE: Multidetector CT imaging of the chest was performed following the standard protocol without IV contrast. COMPARISON:  Chest radiograph dated 07/12/2021 and CT dated 01/20/2021. FINDINGS: Evaluation of this exam is limited in the absence of intravenous contrast. Cardiovascular: There is no cardiomegaly or pericardial effusion. Advanced 3 vessel coronary  vascular calcification postsurgical changes of CABG.  There is moderate atherosclerotic calcification of the thoracic aorta. No aneurysmal dilatation. The central pulmonary arteries are grossly unremarkable. Mediastinum/Nodes: No mediastinal adenopathy. Evaluation of hilar lymph nodes is very limited due to consolidative changes of the lung. The esophagus is grossly unremarkable. No mediastinal fluid collection. Lungs/Pleura: Large right pleural effusion, increased in size since the prior CT. There is near complete compressive atelectasis of the right middle and right lower lobes versus pneumonia. There is partial compressive atelectasis of the right upper lobe. Several small nodular densities noted in the left upper lobe. There is no pneumothorax. There is narrowing of the right upper lobe bronchus as well as near complete occlusion of the right middle and right lower lobe bronchi. Upper Abdomen: No acute abnormality. Musculoskeletal: Osteopenia with degenerative changes of the spine. Median sternotomy wires. No acute osseous pathology. Cervical ACDF. IMPRESSION: 1. Large right pleural effusion, increased in size since the prior CT. Thoracentesis may provide additional diagnostic value as well as symptomatic relief. 2. Near complete compressive atelectasis of the right middle and right lower lobes versus pneumonia. 3. Aortic Atherosclerosis (ICD10-I70.0). Electronically Signed   By: Anner Crete M.D.   On: 07/13/2021 23:05   DG Chest Port 1 View  Result Date: 06/28/2021 CLINICAL DATA:  87 year old male with cough. Right side empyema status post right side thoracentesis two days ago. EXAM: PORTABLE CHEST 1 VIEW COMPARISON:  Portable chest 06/26/2021 and earlier. FINDINGS: Portable AP upright view at 0939 hours. Right lung ventilation remains substantially improved compared to the CT on 06/22/2021, with possible small volume residual effusion at the right lung base. No pneumothorax or pulmonary edema. Stable  cardiac size and mediastinal contours. Prior CABG. Stable and negative left lung. Negative visible bowel gas pattern. Stable visualized osseous structures. IMPRESSION: 1. Substantially improved right lung ventilation following thoracentesis two days ago with possible small residual right pleural effusion. 2. No new cardiopulmonary abnormality. Electronically Signed   By: Genevie Ann M.D.   On: 06/28/2021 10:15   DG Chest Port 1 View  Result Date: 06/26/2021 CLINICAL DATA:  Status post right thoracentesis EXAM: PORTABLE CHEST 1 VIEW COMPARISON:  07/05/2021 FINDINGS: Unchanged cardiomegaly. No pulmonary vascular congestion. Postsurgical changes of CABG again seen. Interval decrease of right pleural effusion. No pneumothorax. IMPRESSION: No pneumothorax status post right thoracentesis. Electronically Signed   By: Miachel Roux M.D.   On: 06/26/2021 14:55   DG Chest Portable 1 View  Result Date: 07/15/2021 CLINICAL DATA:  Shortness of breath cough EXAM: PORTABLE CHEST 1 VIEW COMPARISON:  CT 02/17/2021, radiograph 12/06/2020 FINDINGS: Chronic right pleural effusion appears similar to slightly increased from comparison radiograph 12/14/2020. Some increasing left basilar effusion is noted as well. More bandlike areas of opacity suggestive of atelectasis or scarring. There is diffuse hazy interstitial opacity with pulmonary vascular congestion as well as fissural thickening suggesting some superimposed pulmonary edema. Partial obscuration of the cardiomediastinal contours. Visible contours are similar to comparison prior counting for differences in technique. Prior sternotomy and CABG. The aorta is calcified. The remaining cardiomediastinal contours are unremarkable. Degenerative changes are present in the imaged spine and shoulders. No other significant osseous abnormality. Telemetry leads overlie the chest. IMPRESSION: 1. Suspect some slight increase in size of a chronic right pleural effusion with increasing left  basilar effusion as well. Additional features pulmonary vascular congestion and likely developing interstitial edema could suggest CHF/volume overload 2. Underlying airspace disease in the regions of more coalescent opacity is difficult to fully exclude. 3. Prior sternotomy and CABG. 4.  Aortic Atherosclerosis (ICD10-I70.0). Electronically Signed   By: Lovena Le M.D.   On: 07/08/2021 22:03   US THORACENTESIS ASP PLEURAL SPACE W/IMG GUIDE  Result Date: 06/26/2021 INDICATION: Acute respiratory distress Right pleural effusion EXAM: ULTRASOUND GUIDED RIGHT THORACENTESIS MEDICATIONS: None. COMPLICATIONS: None immediate. PROCEDURE: An ultrasound guided thoracentesis was thoroughly discussed with the patient and questions answered. The benefits, risks, alternatives and complications were also discussed. The patient understands and wishes to proceed with the procedure. Written consent was obtained. Ultrasound was performed to localize and mark an adequate pocket of fluid in the right chest. The area was then prepped and draped in the normal sterile fashion. 1% Lidocaine was used for local anesthesia. Under ultrasound guidance a 6 Fr Safe-T-Centesis catheter was introduced. Thoracentesis was performed. The catheter was removed and a dressing applied. FINDINGS: A total of approximately 1.1 L of bloody fluid was removed. Samples were sent to the laboratory as requested by the clinical team. IMPRESSION: Successful ultrasound guided right thoracentesis yielding 1.1 L of pleural fluid. Electronically Signed   By: Miachel Roux M.D.   On: 06/26/2021 14:48         ASSESSMENT/PLAN   Acute on chronic hypoxemic respiratory failure              -Due to complete atelectasis secondary to large right pleural effusion               -this may be due to CHF as his BNP was slightly elevated                - he had throacentesis with imporvement      -06/29/21- clinically improved, able to ambulate around hallway, repeat CXR  then d/c planning   Right pleural effusion -moderate to large  S/p thoracentesis - fluid profile with bloodly pleural fluid possibly due to eliquis BID          - patient will need repeat imaging and may need repeat CT chest and may need pigtail catheter drainage          - he has personal hx of cancer and advanced copd with increased risk for carcinoma - cytology is in process          -glucose is 90 which point away from infectious etiology but pH was not done Malignant pleural effusion-notified oncology and attending physician.  Advanced COPD with chronic hypoxemia     -agree with solumedrol - will decrease to 40 daily      - will start xopenex and dc remainder of his nebulizer therapy     Complete atelectasis of right lung    - this may be too large for incentive spirometry     - will upgrade to metaneb daily with saline    Right pneumothorax-30% -Patient with overall poor prognosis-recommend palliative involvement including consideration of tunneled permanent pleural catheter for relief of pneumothorax and recurrent effusion with malignancy.    Thank you for allowing me to participate in the care of this patient.   Patient/Family are satisfied with care plan and all questions have been answered.   This document was prepared using Dragon voice recognition software and may include unintentional dictation errors.     Ottie Glazier, M.D.  Division of [facility]

## 2021-06-29 NOTE — Progress Notes (Signed)
[name]    MR#:  [id number]  DATE OF BIRTH:  [birth year]  SUBJECTIVE:  sitting out in the chair. Patient son and daughter-in-law at bedside. Tolerating PO diet.  REVIEW OF SYSTEMS:   Review of Systems  Constitutional:  Negative for chills, fever and weight loss.  HENT:  Negative for ear discharge, ear pain and nosebleeds.   Eyes:  Negative for blurred vision, pain and discharge.  Respiratory:  Positive for shortness of breath. Negative for sputum production, wheezing and stridor.   Cardiovascular:  Negative for chest pain, palpitations, orthopnea and PND.  Gastrointestinal:  Negative for abdominal pain, diarrhea, nausea and vomiting.  Genitourinary:  Negative for frequency and urgency.  Musculoskeletal:  Negative for back pain and joint pain.  Neurological:  Positive for weakness. Negative for sensory change, speech change and focal weakness.  Psychiatric/Behavioral:  Negative for depression and hallucinations. The patient is not nervous/anxious.   Tolerating Diet:yes Tolerating PT: rehab  DRUG ALLERGIES:   Allergies  Allergen Reactions   Indomethacin Nausea Only    VITALS:  Blood pressure 132/84, pulse 79, temperature 97.8 F (36.6 C), temperature source Oral, resp. rate 18, height 5\' 8"  (1.727 m), weight 71.5 kg, SpO2 97 %.  PHYSICAL EXAMINATION:   Physical Exam  GENERAL:  87 y.o.-year-old patient lying in the bed with no acute distress. Chronically ill and fatigued LUNGS: decreasedbreath sounds bilaterally, no wheezing, rales, rhonchi. No use of accessory muscles of respiration.  CARDIOVASCULAR: S1, S2 normal. No murmurs, rubs, or gallops.  ABDOMEN: Soft, nontender, nondistended. Bowel sounds present. No organomegaly or mass.  EXTREMITIES: No cyanosis, clubbing or edema b/l.    NEUROLOGIC:non focal, weak PSYCHIATRIC:  patient is alert and oriented x 2.  SKIN: No obvious rash, lesion, or ulcer.    LABORATORY PANEL:  CBC Recent Labs  Lab 06/27/21 0536  WBC 6.0  HGB 15.7  HCT 48.1  PLT 149*     Chemistries  Recent Labs  Lab 06/26/21 0502 06/27/21 0536 06/28/21 0839  NA 139   < > 140  K 3.9   < > 3.9  CL 95*   < > 94*  CO2 32   < > 39*  GLUCOSE 149*   < > 124*  BUN 28*   < > 39*  CREATININE 1.51*   < > 1.26*  CALCIUM 8.4*   < > 8.6*  MG 2.1  --   --   AST 43*  --   --   ALT 32  --   --   ALKPHOS 73  --   --   BILITOT 0.8  --   --    < > = values in this interval not displayed.    Cardiac Enzymes No results for input(s): TROPONINI in the last 168 hours. RADIOLOGY:  DG Chest Port 1 View  Result Date: 06/28/2021 CLINICAL DATA:  87 year old male with cough. Right side empyema status post right side thoracentesis two days ago. EXAM: PORTABLE CHEST 1 VIEW COMPARISON:  Portable chest 06/26/2021 and earlier. FINDINGS: Portable AP upright view at 0939 hours. Right lung ventilation remains substantially improved compared to the CT on 07/08/2021, with possible small volume residual effusion at the right lung base. No pneumothorax or pulmonary edema. Stable cardiac size and mediastinal contours. Prior CABG. Stable and negative left lung. Negative visible bowel gas pattern. Stable visualized osseous structures. IMPRESSION: 1. Substantially improved right lung ventilation following thoracentesis two  days ago with possible small residual right pleural effusion. 2. No new cardiopulmonary abnormality. Electronically Signed   By: Genevie Ann M.D.   On: 06/28/2021 10:15   ASSESSMENT AND PLAN:  87 y.o. male with medical history significant for CAD s/p CABG, HFrEF (EF 40%), PAF on Eliquis, COPD on chronic prednisone 5 mg daily, RLL lung adenocarcinoma s/p radiation therapy, history of CVA, CKD stage IIIa, HTN, HLD, and OSA (not using CPAP, on 2-3 L O2 via [place] at night) admitted for acute hypoxic respiratory failure likely due to large right-sided pleural effusion.  Acute respiratory failure  with hypoxia secondary to large right pleural effusion associated with compressive atelectasis of right middle and right lower lobes: Malignant Pleural effusion/ cytology positive for adenocarcinoma --Large right pleural effusion seen on CT imaging.  Has history of right lower lobe adenocarcinoma treated with radiation therapy -Continue IV Lasix 40 mg twice daily-- change to PO torsemide -Continue supplemental O2, oxygen weaned to 4 L (from 6 L) s/p thoracentesis on 7/10 -Resume Eliquis -S/p thoracentesis and removal of 1.1 L of bloody fluid. Patient's fluid cytology positive for adenocarcinoma consistent with his history of lung cancer. Dr. Rogue Bussing oncology to see patient. -Consult with pulmonary Dr Lanney Gins appreciated   Acute on chronic HFrEF: Last EF 40%.  Pleural effusion could be related to HFrEF versus malignancy as above. - IV Lasix 40 mg twice daily--change to po torsemide, Lopressor 50 mg twice daily -Strict I/O's and daily weights -Cardiology consult with dr Ubaldo Glassing noted   COPD with acute exacerbation: -Continue IV Solu-Medrol 40 mg twice daily,--change to po prednisone 5 mg qd (chronic) -- scheduled DuoNebs with as needed albuterol, Pulmicort and Singulair -Continue azithromycin  MWF (per out pt regimen) -Continue supplemental O2--chronic   AKI on CKD stage IIIa: Suspect related to vascular congestion and acute hypoxic respiratory failure.    Creatinine 1.1 today   Paroxysmal atrial fibrillation: In sinus rhythm with controlled rate on admission.   --Eliquis restarted on 7/11.   --Continue Lopressor for rate control.   CAD s/p CABG: Denies any chest pain.   --Continue Lopressor, statin.   Hypertension: Currently stable.   Cont home meds   History of CVA: Continue statin.     Hyperlipidemia: --Continue atorvastatin.   OSA: Follows with pulmonology, BiPAP was recommended but he is not using it.  Instead uses 2-3 L of O2 via [place] at night.  Continue supplemental  O2, BiPAP if tolerated. Consider BiPAP at night or during sleep if need due to hypoxia while here   Generalized weakness PT, OT consult--recommends rehab  Procedures: Family communication :son and DIL In room--left VM for James [name] wife at home Consults :cards and pulm CODE STATUS: DNR DVT Prophylaxis :eliquis Level of care: Progressive Cardiac Status is: Inpatient   Dispo: The patient is from: Home              Anticipated d/c is to: SNF              Patient currently is not medically stable to d/c.   Difficult to place patient No  patient overall clinically improving slowly. Hopefully should be able to discharge once rehab bed opens up in a day or two.      TOTAL TIME TAKING CARE OF THIS PATIENT: 25 minutes.  >50% time spent on counselling and coordination of care  Note: This dictation was prepared with Dragon dictation along with smaller phrase technology. Any transcriptional errors that result from this process are unintentional.  Fritzi Mandes M.D    [facility]   CC: Primary care physician; Birdie Sons, MD Patient ID: James [name], male   DOB: 29-Mar-1929, 87 y.o.   MRN: [id number]

## 2021-06-29 NOTE — Progress Notes (Signed)
Patient ID: James [name], male   DOB: Jul 13, 1929, 87 y.o.   MRN: [id number]  spoke at length with patient's wife James [name] on the phone. Discussed the results of pleural fluid and malignant cells in it. Wife understands patient has poor prognosis. She is waiting to its discussed with Dr. Rogue Bussing who was seen patient today. Patient was seen by Dr. Grayland Ormond a couple years ago. Given his overall poor functional status was not deemed a candidate for surgery or chemotherapy. He underwent radiation therapy.  will await discharge plan pending oncology evaluation and consultation.

## 2021-06-29 NOTE — Progress Notes (Signed)
Patient had 7 beats of v tach at 0548. Patient is asymptomatic. MD has been made aware.Will continue to monitor patient.

## 2021-06-29 NOTE — Progress Notes (Signed)
Patient Name: James [name] Date of Encounter: 06/29/2021  Hospital Problem List     Principal Problem:   Acute respiratory failure with hypoxia [facility]) Active Problems:   CAD (coronary artery disease)   Essential (primary) hypertension   Hyperlipidemia   PAF (paroxysmal atrial fibrillation) (HCC)   Acute on chronic HFrEF (heart failure with reduced ejection fraction) (HCC)   History of CVA (cerebrovascular accident)   Primary adenocarcinoma of lower lobe of right lung (HCC)   CKD (chronic kidney disease), stage III (HCC)   Pleural effusion on right    Patient Profile        87 y.o. male with history of atrial fibrillation, HF R EF with a known ejection fraction of 37%, history of coronary disease status post coronary artery bypass grafting and more recently treated medically, history of right lower lobe lung adenocarcinoma status post radiation therapy, history of CVA, hypertension, hyperlipidemia, sleep apnea noncompliant with CPAP on 2 L of oxygen at night, history of a CVA, history of peripheral edema who was admitted with increasing shortness of breath and noted to have a large right pleural effusion with evidence of probable pulmonary vascular congestion.  He underwent thoracentesis with removal of 1.1 L yesterday.  He is feeling somewhat better but still has shortness of breath.  Denies orthopnea or PND.  He denies chest chest pain.  An attempt was to place him on spironolactone to help with his edema however this caused blurred vision as this was held.  Patient was started on Lasix and thoracentesis was carried out as discussed above.  Eliquis was held.  Patient had borderline elevated but flat troponins at 26x2.  Subjective  States he is a little short of breath but better  Inpatient Medications     apixaban  5 mg Oral BID   atorvastatin  80 mg Oral Daily   azithromycin  250 mg Oral Q M,W,F   benzonatate  200 mg Oral TID   fluticasone  2 spray Each Nare Daily   levalbuterol   1.25 mg Nebulization TID   magnesium oxide  400 mg Oral Daily   mouth rinse  15 mL Mouth Rinse BID   melatonin  5 mg Oral QHS   metoprolol tartrate  50 mg Oral BID   montelukast  10 mg Oral QHS   multivitamin with minerals  1 tablet Oral Daily   omega-3 acid ethyl esters  1 g Oral Daily   pantoprazole  20 mg Oral Daily   potassium chloride  20 mEq Oral Daily   predniSONE  5 mg Oral Daily   sodium chloride flush  3 mL Intravenous Q12H   tamsulosin  0.4 mg Oral Daily   [START ON 06/30/2021] torsemide  20 mg Oral Daily    Vital Signs    Vitals:   06/29/21 1142 06/29/21 1355 06/29/21 1557 06/29/21 1628  BP: 132/84  125/64 136/87  Pulse: 79  86 (!) 103  Resp:   18 14  Temp:   98.6 F (37 C) 97.8 F (36.6 C)  TempSrc:   Oral Oral  SpO2: 97% 93% 95% 92%  Weight:      Height:        Intake/Output Summary (Last 24 hours) at 06/29/2021 1634 Last data filed at 06/29/2021 1600 Gross per 24 hour  Intake 580 ml  Output 675 ml  Net -95 ml   Filed Weights   06/26/21 0500 06/28/21 0500 06/29/21 0613  Weight: 81.2 kg 68.9 kg 71.5  kg    Physical Exam    GEN: Well nourished, well developed, in no acute distress.  HEENT: normal.  Neck: Supple, no JVD, carotid bruits, or masses. Cardiac: RRR, no murmurs, rubs, or gallops. No clubbing, cyanosis, edema.  Radials/DP/PT 2+ and equal bilaterally.  Respiratory:  Respirations regular and unlabored, clear to auscultation bilaterally. GI: Soft, nontender, nondistended, BS + x 4. MS: no deformity or atrophy. Skin: warm and dry, no rash. Neuro:  Strength and sensation are intact. Psych: Normal affect.  Labs    CBC Recent Labs    06/27/21 0536  WBC 6.0  HGB 15.7  HCT 48.1  MCV 92.5  PLT 762*   Basic Metabolic Panel Recent Labs    06/27/21 0536 06/28/21 0839  NA 140 140  K 4.3 3.9  CL 98 94*  CO2 34* 39*  GLUCOSE 150* 124*  BUN 37* 39*  CREATININE 1.10 1.26*  CALCIUM 8.5* 8.6*   Liver Function Tests No results for  input(s): AST, ALT, ALKPHOS, BILITOT, PROT, ALBUMIN in the last 72 hours. No results for input(s): LIPASE, AMYLASE in the last 72 hours. Cardiac Enzymes No results for input(s): CKTOTAL, CKMB, CKMBINDEX, TROPONINI in the last 72 hours. BNP No results for input(s): BNP in the last 72 hours. D-Dimer No results for input(s): DDIMER in the last 72 hours. Hemoglobin A1C No results for input(s): HGBA1C in the last 72 hours. Fasting Lipid Panel No results for input(s): CHOL, HDL, LDLCALC, TRIG, CHOLHDL, LDLDIRECT in the last 72 hours. Thyroid Function Tests No results for input(s): TSH, T4TOTAL, T3FREE, THYROIDAB in the last 72 hours.  Invalid input(s): FREET3  Telemetry    Sinus rhythm  ECG       Radiology    CT Chest Wo Contrast  Result Date: 06/26/2021 CLINICAL DATA:  87 year old male with respiratory distress. EXAM: CT CHEST WITHOUT CONTRAST TECHNIQUE: Multidetector CT imaging of the chest was performed following the standard protocol without IV contrast. COMPARISON:  Chest radiograph dated 06/19/2021 and CT dated 01/20/2021. FINDINGS: Evaluation of this exam is limited in the absence of intravenous contrast. Cardiovascular: There is no cardiomegaly or pericardial effusion. Advanced 3 vessel coronary vascular calcification postsurgical changes of CABG. There is moderate atherosclerotic calcification of the thoracic aorta. No aneurysmal dilatation. The central pulmonary arteries are grossly unremarkable. Mediastinum/Nodes: No mediastinal adenopathy. Evaluation of hilar lymph nodes is very limited due to consolidative changes of the lung. The esophagus is grossly unremarkable. No mediastinal fluid collection. Lungs/Pleura: Large right pleural effusion, increased in size since the prior CT. There is near complete compressive atelectasis of the right middle and right lower lobes versus pneumonia. There is partial compressive atelectasis of the right upper lobe. Several small nodular densities  noted in the left upper lobe. There is no pneumothorax. There is narrowing of the right upper lobe bronchus as well as near complete occlusion of the right middle and right lower lobe bronchi. Upper Abdomen: No acute abnormality. Musculoskeletal: Osteopenia with degenerative changes of the spine. Median sternotomy wires. No acute osseous pathology. Cervical ACDF. IMPRESSION: 1. Large right pleural effusion, increased in size since the prior CT. Thoracentesis may provide additional diagnostic value as well as symptomatic relief. 2. Near complete compressive atelectasis of the right middle and right lower lobes versus pneumonia. 3. Aortic Atherosclerosis (ICD10-I70.0). Electronically Signed   By: Anner Crete M.D.   On: 07/05/2021 23:05   DG Chest Port 1 View  Addendum Date: 06/29/2021   ADDENDUM REPORT: 06/29/2021 13:37 ADDENDUM: These results  were called by telephone at the time of interpretation on 06/29/2021 at 1:37 pm to provider FUAD ALESKEROV , who verbally acknowledged these results. Electronically Signed   By: Franchot Gallo M.D.   On: 06/29/2021 13:37   Result Date: 06/29/2021 CLINICAL DATA:  Acute respiratory failure with hypoxia EXAM: PORTABLE CHEST 1 VIEW COMPARISON:  06/28/2021 FINDINGS: Right pneumothorax approximately 30% has developed since the prior study. There is a small right effusion and right lower lobe atelectasis. Postop CABG. Mild cardiac enlargement without heart failure. Mild left lower lobe air airspace disease. IMPRESSION: 30% right pneumothorax. Small right effusion and right lower lobe atelectasis. Electronically Signed: By: Franchot Gallo M.D. On: 06/29/2021 13:32   DG Chest Port 1 View  Result Date: 06/28/2021 CLINICAL DATA:  87 year old male with cough. Right side empyema status post right side thoracentesis two days ago. EXAM: PORTABLE CHEST 1 VIEW COMPARISON:  Portable chest 06/26/2021 and earlier. FINDINGS: Portable AP upright view at 0939 hours. Right lung  ventilation remains substantially improved compared to the CT on 07/15/2021, with possible small volume residual effusion at the right lung base. No pneumothorax or pulmonary edema. Stable cardiac size and mediastinal contours. Prior CABG. Stable and negative left lung. Negative visible bowel gas pattern. Stable visualized osseous structures. IMPRESSION: 1. Substantially improved right lung ventilation following thoracentesis two days ago with possible small residual right pleural effusion. 2. No new cardiopulmonary abnormality. Electronically Signed   By: Genevie Ann M.D.   On: 06/28/2021 10:15   DG Chest Port 1 View  Result Date: 06/26/2021 CLINICAL DATA:  Status post right thoracentesis EXAM: PORTABLE CHEST 1 VIEW COMPARISON:  06/28/2021 FINDINGS: Unchanged cardiomegaly. No pulmonary vascular congestion. Postsurgical changes of CABG again seen. Interval decrease of right pleural effusion. No pneumothorax. IMPRESSION: No pneumothorax status post right thoracentesis. Electronically Signed   By: Miachel Roux M.D.   On: 06/26/2021 14:55   DG Chest Portable 1 View  Result Date: 06/29/2021 CLINICAL DATA:  Shortness of breath cough EXAM: PORTABLE CHEST 1 VIEW COMPARISON:  CT 02/17/2021, radiograph 12/06/2020 FINDINGS: Chronic right pleural effusion appears similar to slightly increased from comparison radiograph 12/14/2020. Some increasing left basilar effusion is noted as well. More bandlike areas of opacity suggestive of atelectasis or scarring. There is diffuse hazy interstitial opacity with pulmonary vascular congestion as well as fissural thickening suggesting some superimposed pulmonary edema. Partial obscuration of the cardiomediastinal contours. Visible contours are similar to comparison prior counting for differences in technique. Prior sternotomy and CABG. The aorta is calcified. The remaining cardiomediastinal contours are unremarkable. Degenerative changes are present in the imaged spine and shoulders. No  other significant osseous abnormality. Telemetry leads overlie the chest. IMPRESSION: 1. Suspect some slight increase in size of a chronic right pleural effusion with increasing left basilar effusion as well. Additional features pulmonary vascular congestion and likely developing interstitial edema could suggest CHF/volume overload 2. Underlying airspace disease in the regions of more coalescent opacity is difficult to fully exclude. 3. Prior sternotomy and CABG. 4.  Aortic Atherosclerosis (ICD10-I70.0). Electronically Signed   By: Lovena Le M.D.   On: 06/24/2021 22:03   US THORACENTESIS ASP PLEURAL SPACE W/IMG GUIDE  Result Date: 06/26/2021 INDICATION: Acute respiratory distress Right pleural effusion EXAM: ULTRASOUND GUIDED RIGHT THORACENTESIS MEDICATIONS: None. COMPLICATIONS: None immediate. PROCEDURE: An ultrasound guided thoracentesis was thoroughly discussed with the patient and questions answered. The benefits, risks, alternatives and complications were also discussed. The patient understands and wishes to proceed with the procedure. Written consent was  obtained. Ultrasound was performed to localize and mark an adequate pocket of fluid in the right chest. The area was then prepped and draped in the normal sterile fashion. 1% Lidocaine was used for local anesthesia. Under ultrasound guidance a 6 Fr Safe-T-Centesis catheter was introduced. Thoracentesis was performed. The catheter was removed and a dressing applied. FINDINGS: A total of approximately 1.1 L of bloody fluid was removed. Samples were sent to the laboratory as requested by the clinical team. IMPRESSION: Successful ultrasound guided right thoracentesis yielding 1.1 L of pleural fluid. Electronically Signed   By: Miachel Roux M.D.   On: 06/26/2021 14:48    Assessment & Plan     87 y.o. male with history of atrial fibrillation, HF R EF with a known ejection fraction of 37%, history of coronary disease status post coronary artery bypass  grafting and more recently treated medically, history of right lower lobe lung adenocarcinoma status post radiation therapy, history of CVA, hypertension, hyperlipidemia, sleep apnea noncompliant with CPAP on 2 L of oxygen at night, history of a CVA, history of peripheral edema who was admitted with increasing shortness of breath and noted to have a large right pleural effusion with evidence of probable pulmonary vascular congestion  1.  Congestive heart failure-patient with a known EF that is mild to moderately reduced at 40%.  Unilateral pleural effusion is more likely consistent with his right lung adenocarcinoma.  He is -794 cc.  2.  Coronary artery disease-appears stable at present. Denies chest pain. Has ruled out for an mi.   3.  Atrial fibrillation-currently stable.  Continue with rate control with metoprolol and resume Eliquis when pulmonary status and procedures stabilized.  4.  Acute renal insufficiency-improved yesterday. Will recheck met b today.  We will continue to follow while diuresing.  5.  Pleural effusion-appreciate pulmonary's assistance.  We will continue to follow.  Signed, Javier Docker Elan Mcelvain MD 06/29/2021, 4:34 PM  Pager: [phone]

## 2021-06-29 NOTE — TOC Progression Note (Signed)
Transition of Care [facility]) - Progression Note    Patient Details  Name: James [name] MRN: [id number] Date of Birth: [birth year]  Transition of Care [facility]) CM/SW [place], [place] Phone Number: 06/29/2021, 1:43 PM  Clinical Narrative:     CSW spoke with [facility] they are able to make a bed offer, CSW spoke with patient's spouse James [name] who is in agreement with this plan for discharge to [facility].   CSW has asked [facility] to start insurance auth, pending auth at this time.   Expected Discharge Plan: Skilled Nursing Facility Barriers to Discharge: Continued Medical Work up  Expected Discharge Plan and Services Expected Discharge Plan: [place] Choice: [place] arrangements for the past 2 months: Single Family Home                                       Social Determinants of Health (SDOH) Interventions    Readmission Risk Interventions Readmission Risk Prevention Plan 06/27/2021  Transportation Screening Complete  PCP or Specialist Appt within 3-5 Days Complete  HRI or [place] Complete  Social Work Consult for [place] Planning/Counseling Complete  Palliative Care Screening Not Applicable  Medication Review [employer]) Complete  Some recent data might be hidden

## 2021-06-30 ENCOUNTER — Inpatient Hospital Stay: Payer: Medicare Other

## 2021-06-30 DIAGNOSIS — J9602 Acute respiratory failure with hypercapnia: Secondary | ICD-10-CM

## 2021-06-30 DIAGNOSIS — J90 Pleural effusion, not elsewhere classified: Secondary | ICD-10-CM

## 2021-06-30 DIAGNOSIS — J9601 Acute respiratory failure with hypoxia: Secondary | ICD-10-CM | POA: Diagnosis not present

## 2021-06-30 DIAGNOSIS — J441 Chronic obstructive pulmonary disease with (acute) exacerbation: Secondary | ICD-10-CM

## 2021-06-30 DIAGNOSIS — I480 Paroxysmal atrial fibrillation: Secondary | ICD-10-CM | POA: Diagnosis not present

## 2021-06-30 DIAGNOSIS — Z515 Encounter for palliative care: Secondary | ICD-10-CM | POA: Diagnosis not present

## 2021-06-30 LAB — BODY FLUID CULTURE W GRAM STAIN: Culture: NO GROWTH

## 2021-06-30 LAB — BLOOD GAS, ARTERIAL
Acid-Base Excess: 11.6 mmol/L — ABNORMAL HIGH (ref 0.0–2.0)
Bicarbonate: 44.3 mmol/L — ABNORMAL HIGH (ref 20.0–28.0)
FIO2: 1
O2 Saturation: 97.9 %
Patient temperature: 37
pCO2 arterial: 101 mmHg (ref 32.0–48.0)
pH, Arterial: 7.25 — ABNORMAL LOW (ref 7.350–7.450)
pO2, Arterial: 116 mmHg — ABNORMAL HIGH (ref 83.0–108.0)

## 2021-06-30 MED ORDER — TRAZODONE HCL 50 MG PO TABS
25.0000 mg | ORAL_TABLET | Freq: Every evening | ORAL | Status: DC | PRN
  Administered 2021-06-30: 25 mg via ORAL
  Filled 2021-06-30: qty 1

## 2021-06-30 MED ORDER — LORAZEPAM 2 MG/ML IJ SOLN
1.0000 mg | INTRAMUSCULAR | Status: DC | PRN
  Administered 2021-06-30: 2 mg via INTRAVENOUS
  Filled 2021-06-30: qty 1

## 2021-06-30 MED ORDER — DILTIAZEM HCL 25 MG/5ML IV SOLN
10.0000 mg | Freq: Once | INTRAVENOUS | Status: AC
  Administered 2021-06-30: 10 mg via INTRAVENOUS
  Filled 2021-06-30: qty 5

## 2021-06-30 MED ORDER — FUROSEMIDE 10 MG/ML IJ SOLN
20.0000 mg | Freq: Once | INTRAMUSCULAR | Status: AC
  Administered 2021-06-30: 20 mg via INTRAVENOUS
  Filled 2021-06-30: qty 2

## 2021-06-30 MED ORDER — MORPHINE SULFATE 2 MG/ML IJ SOLN
2.0000 mg | INTRAMUSCULAR | Status: DC | PRN
  Administered 2021-06-30 (×4): 2 mg via INTRAVENOUS
  Filled 2021-06-30: qty 1
  Filled 2021-06-30: qty 2
  Filled 2021-06-30 (×3): qty 1
  Filled 2021-06-30 (×4): qty 2
  Filled 2021-06-30: qty 1

## 2021-06-30 MED ORDER — GLYCOPYRROLATE 0.2 MG/ML IJ SOLN
0.2000 mg | INTRAMUSCULAR | Status: DC | PRN
  Filled 2021-06-30: qty 1

## 2021-06-30 MED ORDER — LORAZEPAM 2 MG/ML IJ SOLN
0.5000 mg | Freq: Once | INTRAMUSCULAR | Status: AC
  Administered 2021-06-30: 0.5 mg via INTRAVENOUS
  Filled 2021-06-30: qty 1

## 2021-06-30 NOTE — Progress Notes (Signed)
OT Cancellation Note  Patient Details Name: James [name] MRN: [id number] DOB: 1929/06/14   Cancelled Treatment:    Reason Eval/Treat Not Completed: Medical issues which prohibited therapy. Pt with red MEWS this morning, HR in 140's at rest, and placed on BiPAP this morning secondary to respiratory distress. OT to hold this morning but will continue to follow pending pt improvement.  Darleen Crocker, MS, OTR/L , CBIS ascom [phone]  06/30/21, 8:55 AM

## 2021-06-30 NOTE — Progress Notes (Signed)
Critical care note:  Date of note 06/30/2021  Subjective: The patient desatted to the 70s on 6 L of O2 by nasal cannula with mild tachypnea.  He is been apparently taking his oxygen off.  Earlier he was significantly agitated and restless.  He was given his night trazodone and later on 0.5 mg of IV Ativan.  He also became tachycardic with a rate of 1 30-1 43 and atrial fibrillation with RVR with a blood pressure of 148/80 and respiratory rate to 23 and his temperature was 96.7.  He was very lethargic and responsive only to sternal rub.  He was then placed on 100% nonrebreather with a pulse oximetry of 95%.  Objective: Physical examination: Generally: Acutely ill elderly Caucasian male in moderate respiratory distress.  He was responsive only to sternal rub. Vital signs as mentioned above. Head - atraumatic, normocephalic.  Pupils - equal, round and reactive to light and accommodation. Extraocular movements are intact. No scleral icterus.  Oropharynx - moist mucous membranes and tongue. No pharyngeal erythema or exudate.  Neck - supple. No JVD. Carotid pulses 2+ bilaterally. No carotid bruits. No palpable thyromegaly or lymphadenopathy. Cardiovascular - regular rate and rhythm. Normal S1 and S2. No murmurs, gallops or rubs.  Lungs -diminished bibasal breath sounds with bibasal crackles. Abdomen - soft and nontender. Positive bowel sounds. No palpable organomegaly or masses.  Extremities - no pitting edema, clubbing or cyanosis.  Neuro - grossly non-focal. Skin - no rashes. GU and rectal exam - deferred.   Labs and notes were reviewed.  Stat ABG showed pH 7.25 HCO3 44.3, PCO2 101, PO2 116 and O2 sat of 97.9% on 1% FiO2 on nonrebreather.  Assessment/plan: 1.  Acute hypoxic and hypercarbic respiratory failure on top of chronic respiratory failure secondary to advanced COPD and right-sided pleural effusion and atelectasis of the right lung. - The patient was placed on BiPAP as a last resort being  DNR/DNI. - We will follow his pulse currently and ABG. - Pulmonary follow-up. - He was given 20 mg of IV Lasix. - We will avoid sedatives.  2.  Atrial fibrillation with rapid ventricular response. - The patient was given a bolus of 10 mg of IV Cardizem that brought his heart rate down to the low 100s 10 1-1 05. - We will utilize IV Cardizem drip as needed.  3.  We will continue the other current plan of care and close monitoring.  Authorized and performed by: Eugenie Norrie, MD Total critical care time: Approximately    30   minutes. Due to a high probability of clinically significant, life-threatening deterioration, the patient required my highest level of preparedness to intervene emergently and I personally spent this critical care time directly and personally managing the patient.  This critical care time included obtaining a history, examining the patient, pulse oximetry, ordering and review of studies, arranging urgent treatment with development of management plan, evaluation of patient's response to treatment, frequent reassessment, and discussions with other providers. This critical care time was performed to assess and manage the high probability of imminent, life-threatening deterioration that could result in multiorgan failure.  It was exclusive of separately billable procedures and treating other patients and teaching time.  Please see MDM section and the rest of the note for further information on patient assessment and treatment.

## 2021-06-30 NOTE — Progress Notes (Signed)
PT Cancellation Note  Patient Details Name: James [name] MRN: [id number] DOB: March 05, 1929   Cancelled Treatment:    Reason Eval/Treat Not Completed: Medical issues which prohibited therapy.  Pt with red MEWS this morning, HR in 140's at rest, and placed on BiPAP this morning secondary to respiratory distress. PT to hold this morning but will continue to follow pending pt improvement.   Gwenlyn Saran, PT, DPT 06/30/21, 10:04 AM

## 2021-06-30 NOTE — Consult Note (Signed)
[place]  [phone] [phone]   Name: James [name] Date: 06/30/2021 MRN: [id number]  DOB: [birth year]  Patient Care Team: Birdie Sons, MD as PCP - General (Family Medicine) Pa, [facility] Dannielle Huh, Luvenia Heller, MD as Consulting Physician (Pulmonary Disease) Ubaldo Glassing Javier Docker, MD as Consulting Physician (Cardiology) Abbie Sons, MD (Urology)    REASON FOR CONSULTATION: James [name] is a 87 y.o. male with multiple medical problems including CAD status post CABG, HFrEF, PAF on Eliquis, COPD on chronic prednisone, OSA on nocturnal O2, history of CVA, and right lower lobe lung adenocarcinoma status post XRT.  Patient was admitted to hospital 07/06/2021 with hypoxic respiratory failure secondary to large right pleural effusion and possible COPD exacerbation.  Patient underwent thoracentesis.  Unfortunately, he decompensated on 06/30/2021 with chest x-ray that showed right-sided pneumothorax and recurrent malignant pleural effusion.  He was placed on BiPAP and after meeting with pulmonology, family decided to transition to comfort care.  SOCIAL HISTORY:     reports that he quit smoking about 37 years ago. His smoking use included cigarettes. He has a 11.00 pack-year smoking history. He has never used smokeless tobacco. He reports current alcohol use of about 1.0 - 2.0 standard drink of alcohol per week. He reports that he does not use drugs.  Patient is married and lives at home with his wife.  He has a son in [place].  ADVANCE DIRECTIVES:  None on file  CODE STATUS: DNR  PAST MEDICAL HISTORY: Past Medical History:  Diagnosis Date   Arthritis    Back pain    CAD (coronary artery disease)    CAP (community acquired pneumonia) 11/06/2017   Cataract    right   COPD (chronic obstructive pulmonary disease) ([place])    SPiriva and SYmbicort daily. Albuterol as needed   Diverticulosis    Dyspnea    with  exertion   Enlarged prostate    takes Flomax daily   GERD (gastroesophageal reflux disease)    History of chicken pox    History of gout    History of measles    History of mumps    HOH (hard of hearing)    Hyperglycemia    Hyperlipidemia    takes Atorvastatin daily   Hypertension    takes Metoprolol daily as well as Lotensin HCT   Hypotension    Joint pain    Microscopic colitis    PAF (paroxysmal atrial fibrillation) ([place]), RVR 03/14/2017   Pneumonia    Prostate cancer ([place])    Stroke ([place])    TIA   TIA (transient ischemic attack)    Weakness    numbness and tingling.mainly on right    PAST SURGICAL HISTORY:  Past Surgical History:  Procedure Laterality Date   ANTERIOR CERVICAL DECOMP/DISCECTOMY FUSION N/A 03/07/2017   Procedure: ANTERIOR CERVICAL DECOMPRESSION/DISCECTOMY FUSION CERVICAL THREE- CERVICAL FOUR, CERVICAL FOUR- CERVICAL FIVE;  Surgeon: Newman Pies, MD;  Location: [facility];  Service: Neurosurgery;  Laterality: N/A;  ANTERIOR CERVICAL DECOMPRESSION/DISCECTOMY FUSION CERVICAL 3- CERVICAL 4, CERVIACL 4- CERVICAL 5   APPENDECTOMY  1950   CARDIAC CATHETERIZATION  05/29/2013   EF=40-45%. Moderate pulmonary hypertension. Infero/ lateral hypokinesis   cataract surgery Left    COLONOSCOPY     CORONARY ARTERY BYPASS GRAFT  2011   x 5   ESOPHAGOGASTRODUODENOSCOPY (EGD) WITH PROPOFOL N/A 07/01/2018   Procedure: ESOPHAGOGASTRODUODENOSCOPY (EGD) WITH PROPOFOL;  Surgeon: Lin Landsman, MD;  Location: [facility]  ENDOSCOPY;  Service: Gastroenterology;  Laterality: N/A;   EYE SURGERY     FLEXIBLE BRONCHOSCOPY N/A 11/19/2017   Procedure: FLEXIBLE BRONCHOSCOPY;  Surgeon: Laverle Hobby, MD;  Location: [facility] ORS;  Service: Pulmonary;  Laterality: N/A;   MRI BRAIN  06/07/2013   Mild chronic involutional changes. No acute abnormalities   MRI of neck     Myocardial Perfusion Scan  05/29/2013   Abnormal myocardial perfusion image consistent with myocardial infarction    PROSTATE SURGERY  11/18/2012   radiation seed   TOTAL HIP ARTHROPLASTY Left 1997   hip joint replacement    HEMATOLOGY/ONCOLOGY HISTORY:  Oncology History   No history exists.    ALLERGIES:  is allergic to indomethacin.  MEDICATIONS:  Current Facility-Administered Medications  Medication Dose Route Frequency Provider Last Rate Last Admin   0.9 %  sodium chloride infusion   Intravenous PRN Max Sane, MD   Stopped at 06/26/21 1416   glycopyrrolate (ROBINUL) injection 0.2 mg  0.2 mg Intravenous Q4H PRN Jamayia Croker, Kirt Boys, NP       LORazepam (ATIVAN) injection 1-2 mg  1-2 mg Intravenous Q4H PRN Khrystyne Arpin, Kirt Boys, NP       morphine 2 MG/ML injection 2-4 mg  2-4 mg Intravenous Q30 min PRN Acheron Sugg, Kirt Boys, NP       ondansetron (ZOFRAN) injection 4 mg  4 mg Intravenous Q6H PRN Lenore Cordia, MD        VITAL SIGNS: BP 120/73 (BP Location: Left Arm)   Pulse (!) 144   Temp (!) 97.1 F (36.2 C)   Resp 20   Ht _0  (1.727 m)   Wt 157 lb 9.6 oz (71.5 kg)   SpO2 91%   BMI 23.96 kg/m  Filed Weights   06/26/21 0500 06/28/21 0500 06/29/21 0613  Weight: 179 lb (81.2 kg) 151 lb 12.8 oz (68.9 kg) 157 lb 9.6 oz (71.5 kg)    Estimated body mass index is 23.96 kg/m as calculated from the following:   Height as of this encounter: _1  (1.727 m).   Weight as of this encounter: 157 lb 9.6 oz (71.5 kg).  LABS: CBC:    Component Value Date/Time   WBC 6.0 06/27/2021 0536   HGB 15.7 06/27/2021 0536   HGB 13.6 06/06/2021 1128   HCT 48.1 06/27/2021 0536   HCT 41.9 06/06/2021 1128   PLT 149 (L) 06/27/2021 0536   PLT 151 06/06/2021 1128   MCV 92.5 06/27/2021 0536   MCV 92 06/06/2021 1128   MCV 87 06/04/2013 1907   NEUTROABS 2.8 06/20/2021 2135   NEUTROABS 5.7 10/25/2020 1059   NEUTROABS 2.9 06/04/2013 1907   LYMPHSABS 1.1 06/28/2021 2135   LYMPHSABS 0.8 10/25/2020 1059   LYMPHSABS 1.7 06/04/2013 1907   MONOABS 0.6 06/26/2021 2135   MONOABS 0.6 06/04/2013 1907   EOSABS 0.1  06/30/2021 2135   EOSABS 0.0 10/25/2020 1059   EOSABS 0.4 06/04/2013 1907   BASOSABS 0.0 06/19/2021 2135   BASOSABS 0.0 10/25/2020 1059   BASOSABS 0.1 06/04/2013 1907   Comprehensive Metabolic Panel:    Component Value Date/Time   NA 140 06/28/2021 0839   NA 145 (H) 06/06/2021 1128   NA 136 06/04/2013 1907   K 3.9 06/28/2021 0839   K 4.2 06/04/2013 1907   CL 94 (L) 06/28/2021 0839   CL 103 06/04/2013 1907   CO2 39 (H) 06/28/2021 0839   CO2 26 06/04/2013 1907   BUN 39 (H) 06/28/2021 0839   BUN  19 06/06/2021 1128   BUN 20 (H) 06/04/2013 1907   CREATININE 1.26 (H) 06/28/2021 0839   CREATININE 1.12 06/04/2013 1907   GLUCOSE 124 (H) 06/28/2021 0839   GLUCOSE 80 06/04/2013 1907   CALCIUM 8.6 (L) 06/28/2021 0839   CALCIUM 8.9 06/04/2013 1907   AST 43 (H) 06/26/2021 0502   ALT 32 06/26/2021 0502   ALKPHOS 73 06/26/2021 0502   BILITOT 0.8 06/26/2021 0502   BILITOT 0.4 06/06/2021 1128   PROT 7.2 06/26/2021 0502   PROT 6.3 06/06/2021 1128   ALBUMIN 4.0 06/26/2021 0502   ALBUMIN 4.3 06/06/2021 1128    RADIOGRAPHIC STUDIES: CT Chest Wo Contrast  Result Date: 07/10/2021 CLINICAL DATA:  87 year old male with respiratory distress. EXAM: CT CHEST WITHOUT CONTRAST TECHNIQUE: Multidetector CT imaging of the chest was performed following the standard protocol without IV contrast. COMPARISON:  Chest radiograph dated 06/22/2021 and CT dated 01/20/2021. FINDINGS: Evaluation of this exam is limited in the absence of intravenous contrast. Cardiovascular: There is no cardiomegaly or pericardial effusion. Advanced 3 vessel coronary vascular calcification postsurgical changes of CABG. There is moderate atherosclerotic calcification of the thoracic aorta. No aneurysmal dilatation. The central pulmonary arteries are grossly unremarkable. Mediastinum/Nodes: No mediastinal adenopathy. Evaluation of hilar lymph nodes is very limited due to consolidative changes of the lung. The esophagus is grossly  unremarkable. No mediastinal fluid collection. Lungs/Pleura: Large right pleural effusion, increased in size since the prior CT. There is near complete compressive atelectasis of the right middle and right lower lobes versus pneumonia. There is partial compressive atelectasis of the right upper lobe. Several small nodular densities noted in the left upper lobe. There is no pneumothorax. There is narrowing of the right upper lobe bronchus as well as near complete occlusion of the right middle and right lower lobe bronchi. Upper Abdomen: No acute abnormality. Musculoskeletal: Osteopenia with degenerative changes of the spine. Median sternotomy wires. No acute osseous pathology. Cervical ACDF. IMPRESSION: 1. Large right pleural effusion, increased in size since the prior CT. Thoracentesis may provide additional diagnostic value as well as symptomatic relief. 2. Near complete compressive atelectasis of the right middle and right lower lobes versus pneumonia. 3. Aortic Atherosclerosis (ICD10-I70.0). Electronically Signed   By: Anner Crete M.D.   On: 06/24/2021 23:05   DG Chest Port 1 View  Result Date: 06/30/2021 CLINICAL DATA:  Pleural effusion EXAM: PORTABLE CHEST 1 VIEW COMPARISON:  06/29/2021 FINDINGS: Right pneumothorax is again noted, unchanged, approximately 30% or greater. Right base atelectasis. Left base atelectasis. Heart is normal size. Prior CABG. IMPRESSION: Moderate to large right pneumothorax of approximately 30% or more, stable since prior study. Bibasilar atelectasis. Electronically Signed   By: Rolm Baptise M.D.   On: 06/30/2021 08:47   DG Chest Port 1 View  Addendum Date: 06/29/2021   ADDENDUM REPORT: 06/29/2021 13:37 ADDENDUM: These results were called by telephone at the time of interpretation on 06/29/2021 at 1:37 pm to provider FUAD ALESKEROV , who verbally acknowledged these results. Electronically Signed   By: Franchot Gallo M.D.   On: 06/29/2021 13:37   Result Date:  06/29/2021 CLINICAL DATA:  Acute respiratory failure with hypoxia EXAM: PORTABLE CHEST 1 VIEW COMPARISON:  06/28/2021 FINDINGS: Right pneumothorax approximately 30% has developed since the prior study. There is a small right effusion and right lower lobe atelectasis. Postop CABG. Mild cardiac enlargement without heart failure. Mild left lower lobe air airspace disease. IMPRESSION: 30% right pneumothorax. Small right effusion and right lower lobe atelectasis. Electronically Signed: By:  Franchot Gallo M.D. On: 06/29/2021 13:32   DG Chest Port 1 View  Result Date: 06/28/2021 CLINICAL DATA:  87 year old male with cough. Right side empyema status post right side thoracentesis two days ago. EXAM: PORTABLE CHEST 1 VIEW COMPARISON:  Portable chest 06/26/2021 and earlier. FINDINGS: Portable AP upright view at 0939 hours. Right lung ventilation remains substantially improved compared to the CT on 06/26/2021, with possible small volume residual effusion at the right lung base. No pneumothorax or pulmonary edema. Stable cardiac size and mediastinal contours. Prior CABG. Stable and negative left lung. Negative visible bowel gas pattern. Stable visualized osseous structures. IMPRESSION: 1. Substantially improved right lung ventilation following thoracentesis two days ago with possible small residual right pleural effusion. 2. No new cardiopulmonary abnormality. Electronically Signed   By: Genevie Ann M.D.   On: 06/28/2021 10:15   DG Chest Port 1 View  Result Date: 06/26/2021 CLINICAL DATA:  Status post right thoracentesis EXAM: PORTABLE CHEST 1 VIEW COMPARISON:  07/12/2021 FINDINGS: Unchanged cardiomegaly. No pulmonary vascular congestion. Postsurgical changes of CABG again seen. Interval decrease of right pleural effusion. No pneumothorax. IMPRESSION: No pneumothorax status post right thoracentesis. Electronically Signed   By: Miachel Roux M.D.   On: 06/26/2021 14:55   DG Chest Portable 1 View  Result Date:  07/04/2021 CLINICAL DATA:  Shortness of breath cough EXAM: PORTABLE CHEST 1 VIEW COMPARISON:  CT 02/17/2021, radiograph 12/06/2020 FINDINGS: Chronic right pleural effusion appears similar to slightly increased from comparison radiograph 12/14/2020. Some increasing left basilar effusion is noted as well. More bandlike areas of opacity suggestive of atelectasis or scarring. There is diffuse hazy interstitial opacity with pulmonary vascular congestion as well as fissural thickening suggesting some superimposed pulmonary edema. Partial obscuration of the cardiomediastinal contours. Visible contours are similar to comparison prior counting for differences in technique. Prior sternotomy and CABG. The aorta is calcified. The remaining cardiomediastinal contours are unremarkable. Degenerative changes are present in the imaged spine and shoulders. No other significant osseous abnormality. Telemetry leads overlie the chest. IMPRESSION: 1. Suspect some slight increase in size of a chronic right pleural effusion with increasing left basilar effusion as well. Additional features pulmonary vascular congestion and likely developing interstitial edema could suggest CHF/volume overload 2. Underlying airspace disease in the regions of more coalescent opacity is difficult to fully exclude. 3. Prior sternotomy and CABG. 4.  Aortic Atherosclerosis (ICD10-I70.0). Electronically Signed   By: Lovena Le M.D.   On: 06/22/2021 22:03   US THORACENTESIS ASP PLEURAL SPACE W/IMG GUIDE  Result Date: 06/26/2021 INDICATION: Acute respiratory distress Right pleural effusion EXAM: ULTRASOUND GUIDED RIGHT THORACENTESIS MEDICATIONS: None. COMPLICATIONS: None immediate. PROCEDURE: An ultrasound guided thoracentesis was thoroughly discussed with the patient and questions answered. The benefits, risks, alternatives and complications were also discussed. The patient understands and wishes to proceed with the procedure. Written consent was obtained.  Ultrasound was performed to localize and mark an adequate pocket of fluid in the right chest. The area was then prepped and draped in the normal sterile fashion. 1% Lidocaine was used for local anesthesia. Under ultrasound guidance a 6 Fr Safe-T-Centesis catheter was introduced. Thoracentesis was performed. The catheter was removed and a dressing applied. FINDINGS: A total of approximately 1.1 L of bloody fluid was removed. Samples were sent to the laboratory as requested by the clinical team. IMPRESSION: Successful ultrasound guided right thoracentesis yielding 1.1 L of pleural fluid. Electronically Signed   By: Miachel Roux M.D.   On: 06/26/2021 14:48    PERFORMANCE  STATUS (ECOG) : 4 - Bedbound  Review of Systems Unable to complete  Physical Exam General: Critically ill-appearing Cardiovascular: Irregular, tacky Pulmonary: Paradoxical breathing, on BiPAP Extremities: no edema, no joint deformities Skin: no rashes Neurological: Unresponsive  IMPRESSION: I met with patient's wife who is at bedside.  She updated me on her conversation with Dr. Lanney Gins this AM.  She says that patient has been "suffering for several years" and she now recognizes that he is imminently terminal and she does not wish for him to suffer any longer.  Her son is in route from [place].  Wife says that she would like to keep patient on BiPAP until her son arrives and then would be interested in transitioning patient to full comfort care including removal of BiPAP.  She discussed her plans to contact funeral home and finalize patient's postmortem care.  She confirms that patient would not want further aggressive measures nor would he want intubation or resuscitation.  Patient is appropriately a DNR/DNI.  PLAN: -Comfort care when family is ready -Comfort care orders entered -Chaplain consult -DNR/DNI  Case and plan discussed with Drs. Rogue Bussing and [employer]	[place].  Discussed with nursing.   Time Total: 30 minutes  Visit  consisted of counseling and education dealing with the complex and emotionally intense issues of symptom management and palliative care in the setting of serious and potentially life-threatening illness.Greater than 50%  of this time was spent counseling and coordinating care related to the above assessment and plan.  Signed by: Altha Harm, PhD, NP-C

## 2021-06-30 NOTE — Progress Notes (Signed)
   06/30/21 0441  Assess: MEWS Score  Temp 97.6 F (36.4 C)  BP (!) 148/80  Pulse Rate (!) 135  ECG Heart Rate (!) 135  Resp (!) 23  Level of Consciousness Unresponsive  SpO2 (!) 85 %  O2 Device Nasal Cannula  Patient Activity (if Appropriate) In chair  O2 Flow Rate (L/min) 4 L/min  Assess: if the MEWS score is Yellow or Red  Were vital signs taken at a resting state? Yes  Focused Assessment Change from prior assessment (see assessment flowsheet)  Does the patient meet 2 or more of the SIRS criteria? No  MEWS guidelines implemented *See Row Information* Yes  Treat  MEWS Interventions Other (Comment);Consulted Respiratory Therapy;Escalated (See documentation below) (Respiratoy. MD, and RRT was notified and came to bedside)  Pain Scale Faces  Faces Pain Scale 0  Take Vital Signs  Increase Vital Sign Frequency  Red: Q 1hr X 4 then Q 4hr X 4, if remains red, continue Q 4hrs  Escalate  MEWS: Escalate Red: discuss with charge nurse/RN and provider, consider discussing with RRT  Notify: Charge Nurse/RN  Name of Charge Nurse/RN Notified Sheryle Spray, RN  Date Charge Nurse/RN Notified 06/30/21  Time Charge Nurse/RN Notified 0441  Notify: Provider  Provider Name/Title Eugenie Norrie, MD  Date Provider Notified 06/30/21  Time Provider Notified [phone]  Notification Type  (secure chat)  Notification Reason Change in status (HR in afib in 120s-140s and unresponsive and low O2 Sat)  Provider response At bedside  Date of Provider Response 06/30/21  Time of Provider Response 0456  Notify: Rapid Response  Name of Rapid Response RN Notified RN notified (Was notified but was not able to make it so [facility] was sent)  Date Rapid Response Notified 06/30/21  Time Rapid Response Notified 0441  Document  Patient Outcome Other (Comment) (Orders in [facility]; patient placed non rebreather mask;)  Progress note created (see row info) Yes

## 2021-06-30 NOTE — Progress Notes (Signed)
[place] [facility] [place] NAME: Aric Jost    MR#:  [id number]  DATE OF BIRTH:  July 10, 1929  SUBJECTIVE:  patient overall had a rough night. Got agitated became hypoxic requiring BiPAP and few rounds of IV Ativan. Went into a fib/tachycardia requiring IV Cardizem. Currently lethargic unable to keep his eyes open. Son and wife in the room.  REVIEW OF SYSTEMS:   Review of Systems  Unable to perform ROS: Mental status change   DRUG ALLERGIES:   Allergies  Allergen Reactions   Indomethacin Nausea Only    VITALS:  Blood pressure 120/73, pulse (!) 144, temperature (!) 97.1 F (36.2 C), resp. rate 20, height 5\' 8"  (1.727 m), weight 71.5 kg, SpO2 91 %.  PHYSICAL EXAMINATION:   Physical Exam  GENERAL:  87 y.o.-year-old patient lying in the bed with  acute distress. Chronically ill and fatigued LUNGS: decreasedbreath sounds bilaterally, no wheezing, rales, rhonchi. No use of accessory muscles of respiration.  CARDIOVASCULAR: S1, S2 normal. No murmurs, rubs, or gallops. Tachycardia ABDOMEN: Soft, nontender, nondistended. Bowel sounds present. No organomegaly or mass.  EXTREMITIES: No cyanosis, clubbing or edema b/l.    NEUROLOGIC:non focal, weak PSYCHIATRIC:  patient is lethargic from sedating meds received earlier  LABORATORY PANEL:  CBC Recent Labs  Lab 06/27/21 0536  WBC 6.0  HGB 15.7  HCT 48.1  PLT 149*     Chemistries  Recent Labs  Lab 06/26/21 0502 06/27/21 0536 06/28/21 0839  NA 139   < > 140  K 3.9   < > 3.9  CL 95*   < > 94*  CO2 32   < > 39*  GLUCOSE 149*   < > 124*  BUN 28*   < > 39*  CREATININE 1.51*   < > 1.26*  CALCIUM 8.4*   < > 8.6*  MG 2.1  --   --   AST 43*  --   --   ALT 32  --   --   ALKPHOS 73  --   --   BILITOT 0.8  --   --    < > = values in this interval not displayed.    Cardiac Enzymes No results for input(s): TROPONINI in the last 168 hours. RADIOLOGY:  DG Chest Port 1 View  Result Date:  06/30/2021 CLINICAL DATA:  Pleural effusion EXAM: PORTABLE CHEST 1 VIEW COMPARISON:  06/29/2021 FINDINGS: Right pneumothorax is again noted, unchanged, approximately 30% or greater. Right base atelectasis. Left base atelectasis. Heart is normal size. Prior CABG. IMPRESSION: Moderate to large right pneumothorax of approximately 30% or more, stable since prior study. Bibasilar atelectasis. Electronically Signed   By: Rolm Baptise M.D.   On: 06/30/2021 08:47   DG Chest Port 1 View  Addendum Date: 06/29/2021   ADDENDUM REPORT: 06/29/2021 13:37 ADDENDUM: These results were called by telephone at the time of interpretation on 06/29/2021 at 1:37 pm to provider FUAD ALESKEROV , who verbally acknowledged these results. Electronically Signed   By: Franchot Gallo M.D.   On: 06/29/2021 13:37   Result Date: 06/29/2021 CLINICAL DATA:  Acute respiratory failure with hypoxia EXAM: PORTABLE CHEST 1 VIEW COMPARISON:  06/28/2021 FINDINGS: Right pneumothorax approximately 30% has developed since the prior study. There is a small right effusion and right lower lobe atelectasis. Postop CABG. Mild cardiac enlargement without heart failure. Mild left lower lobe air airspace disease. IMPRESSION: 30% right pneumothorax. Small right effusion and right lower lobe atelectasis. Electronically  Signed: By: Franchot Gallo M.D. On: 06/29/2021 13:32   ASSESSMENT AND PLAN:  87 y.o. male with medical history significant for CAD s/p CABG, HFrEF (EF 40%), PAF on Eliquis, COPD on chronic prednisone 5 mg daily, RLL lung adenocarcinoma s/p radiation therapy, history of CVA, CKD stage IIIa, HTN, HLD, and OSA (not using CPAP, on 2-3 L O2 via [place] at night) admitted for acute hypoxic respiratory failure likely due to large right-sided pleural effusion.  Acute respiratory failure with hypoxia secondary to large right pleural effusion associated with compressive atelectasis of right middle and right lower lobes: Malignant Pleural effusion/ cytology  positive for adenocarcinoma --Large right pleural effusion seen on CT imaging.  Has history of right lower lobe adenocarcinoma treated with radiation therapy --Consult with pulmonary Dr Lanney Gins appreciated -- seen by oncology. Carries a very poor prognosis. Oncology nurse practitioner and pulmonology along with me discussed with [name] and son in the room. They do understand patient is not doing well may not survive hospital stay. Wife and son are in agreement with comfort care/hospice.   Acute on chronic HFrEF:   COPD with acute exacerbation/end-stage   AKI on CKD stage IIIa: Suspect related to vascular congestion and acute hypoxic respiratory failure.    Creatinine 1.1 today   Paroxysmal atrial fibrillation:   CAD s/p CABG:   Hypertension:   History of CVA:    Hyperlipidemia:    Procedures: Family communication :son and  Horris Latino wife Consults :cards and pulm CODE STATUS: DNR DVT Prophylaxis :eliquis Level of care: Med-Surg Status is: Inpatient   Dispo: The patient is from: Home              Anticipated d/c is to: SNF              Patient currently is not medically stable to d/c.   Difficult to place patient No  patient overall declined overnight. Gnosis. Now on comfort care. Hospice referral made. Wife and son request hospice facility.     TOTAL TIME TAKING CARE OF THIS PATIENT: 25 minutes.  >50% time spent on counselling and coordination of care  Note: This dictation was prepared with Dragon dictation along with smaller phrase technology. Any transcriptional errors that result from this process are unintentional.  Fritzi Mandes M.D    [facility]   CC: Primary care physician; Birdie Sons, MD Patient ID: [name], male   DOB: 08-01-1929, 87 y.o.   MRN: [id number]

## 2021-06-30 NOTE — Progress Notes (Addendum)
[facility]) RN Hospital Liaison Note:  Received request from Pricilla Riffle, [place] for family interest in [place]. Chart and patient information reviewed by [facility] physician. [facility] eligibility confirmed.   Spoke with patient's wife, Horris Latino to confirm interest and explain services.   Unfortunately, [facility] is unable to offer a room today. Family and Pricilla Riffle, LCSW [facility] manager aware hospital liaison will follow up tomorrow or sooner if a room becomes available.   Please do not hesitate to call with hospice related questions.   Thank you,   Bobbie "Loren Racer, [place], BSN [facility] Liaison [phone]

## 2021-06-30 NOTE — Consult Note (Signed)
Discussed Dr. Heron Sabins Borders patient-given acute decline in his respiratory status/plan comfort measures.  Wife son aware.  GB

## 2021-06-30 NOTE — Progress Notes (Addendum)
Patient displayed abdominal breathing and o2 sat stated the level was between 80-85. Charge nurse was notified along with MD, RRT, and Respiratory. All came to bedside. Orders were placed in [facility] and patient is being continuously monitored.   Update: James [name]- Patient was placed on Bipap. Will continue to monitor.   Update: 24- In secure chat, confirmed with MD that wife will be called.

## 2021-06-30 NOTE — Progress Notes (Signed)
   06/30/21 1114  Clinical Encounter Type  Visited With Patient and family together  Visit Type Spiritual support  Referral From Nurse  Consult/Referral To [place] responded to an OR for end of life in room 251A Pt, Mr. James [name]. Pt's wife and friend by his bedside. I provided reflective listening, words of comfort and a ministry of presence. Prayer was given and received at the end of the visit. The pt's friend hugged me and thanked me for the prayer and asked if I could come back to visit them again later, I said yes, I will

## 2021-06-30 NOTE — Progress Notes (Signed)
Pulmonary Medicine          Date: 06/30/2021,   MRN# [id number] James [name] 1929/06/17     AdmissionWeight: 81.2 kg                 CurrentWeight: 71.5 kg   Referring physician: Dr Manuella Ghazi.   CHIEF COMPLAINT:   Acute hypoxemic respiratory failure   HISTORY OF PRESENT ILLNESS   This is a pleasant 87 yo male with hx of CAD, AF, CABG, Advanced COPD on O2 at home, hx of prostate CA lives with wife at home.  He shares that he was in [place] and was able to get booster shot for COVID.  He felt unwell after this with increased O2 requirement and worsening dyspnea.  He was admitted for this and had right pleural effusion drained with bloody fluid on aspirate.  He remains with dyspnea and PCCM consulted for further evaluation and management.   06/28/21-  patient is improved.  He had PT and will need additional rehab on outpatient.  He feels breathing is better.  CXR done this AM looks reassuring with minimal residual pleural fluid. Cardiac status much improved.  There is mild aki this am. Recommend d/c planning patient is close to baseline.   06/29/21- patient is improved and is close to baseline, he may be dcd home with outpatient follow up. Home health with PT is needed at minimum.  Please continue prednisone 44m as per home regimen and home zithromax.   06/30/21- patient is worse today, he is poorly responsive with pneumothorax of right lung and malignant pleural effusion.  He is actively passing away, I met with son and daughter yesterday and spoke with wife today.  Anticipate in hospital death.   Recommed comfort care wife James [name] agreeable wishes to initiate after she and children are at bedside.    PAST MEDICAL HISTORY   Past Medical History:  Diagnosis Date   Arthritis    Back pain    CAD (coronary artery disease)    CAP (community acquired pneumonia) 11/06/2017   Cataract    right   COPD (chronic obstructive pulmonary disease) ([place]    SPiriva and SYmbicort daily.  Albuterol as needed   Diverticulosis    Dyspnea    with exertion   Enlarged prostate    takes Flomax daily   GERD (gastroesophageal reflux disease)    History of chicken pox    History of gout    History of measles    History of mumps    HOH (hard of hearing)    Hyperglycemia    Hyperlipidemia    takes Atorvastatin daily   Hypertension    takes Metoprolol daily as well as Lotensin HCT   Hypotension    Joint pain    Microscopic colitis    PAF (paroxysmal atrial fibrillation) ([place] 03/14/2017   Pneumonia    Prostate cancer ([place]    Stroke ([place]    TIA   TIA (transient ischemic attack)    Weakness    numbness and tingling.mainly on right     SURGICAL HISTORY   Past Surgical History:  Procedure Laterality Date   ANTERIOR CERVICAL DECOMP/DISCECTOMY FUSION N/A 03/07/2017   Procedure: ANTERIOR CERVICAL DECOMPRESSION/DISCECTOMY FUSION CERVICAL THREE- CERVICAL FOUR, CERVICAL FOUR- CERVICAL FIVE;  Surgeon: JNewman Pies MD;  [facility] [facility]  Service: Neurosurgery;  Laterality: N/A;  ANTERIOR CERVICAL DECOMPRESSION/DISCECTOMY FUSION CERVICAL 3- CERVICAL 4, CERVIACL 4- CERVICAL 5   APPENDECTOMY  1950  CARDIAC CATHETERIZATION  05/29/2013   EF=40-45%. Moderate pulmonary hypertension. Infero/ lateral hypokinesis   cataract surgery Left    COLONOSCOPY     CORONARY ARTERY BYPASS GRAFT  2011   x 5   ESOPHAGOGASTRODUODENOSCOPY (EGD) WITH PROPOFOL N/A 07/01/2018   Procedure: ESOPHAGOGASTRODUODENOSCOPY (EGD) WITH PROPOFOL;  Surgeon: Lin Landsman, MD;  Location: [place];  Service: Gastroenterology;  Laterality: N/A;   EYE SURGERY     FLEXIBLE BRONCHOSCOPY N/A 11/19/2017   Procedure: FLEXIBLE BRONCHOSCOPY;  Surgeon: Laverle Hobby, MD;  Location: [facility];  Service: Pulmonary;  Laterality: N/A;   MRI BRAIN  06/07/2013   Mild chronic involutional changes. No acute abnormalities   MRI of neck     Myocardial Perfusion Scan  05/29/2013   Abnormal myocardial  perfusion image consistent with myocardial infarction   PROSTATE SURGERY  11/18/2012   radiation seed   TOTAL HIP ARTHROPLASTY Left 1997   hip joint replacement     FAMILY HISTORY   Family History  Problem Relation Age of Onset   Heart attack Mother    Stroke Father    Heart attack Father    Hypertension Father    Heart attack Sister    Bladder Cancer Brother    Kidney cancer Brother    Heart Problems Brother      SOCIAL HISTORY   Social History   Tobacco Use   Smoking status: Former    Packs/day: 1.00    Years: 11.00    Pack years: 11.00    Types: Cigarettes    Quit date: 02/12/1984    Years since quitting: 37.4   Smokeless tobacco: Never   Tobacco comments:    quit smoking 30 yrs ago  Vaping Use   Vaping Use: Never used  Substance Use Topics   Alcohol use: Yes    Alcohol/week: 1.0 - 2.0 standard drink    Types: 1 - 2 Cans of beer per week   Drug use: No     MEDICATIONS    Home Medication:    Current Medication:  Current Facility-Administered Medications:    0.9 %  sodium chloride infusion, , Intravenous, PRN, Max Sane, MD, Stopped at 06/26/21 1416   acetaminophen (TYLENOL) tablet 650 mg, 650 mg, Oral, Q6H PRN, 650 mg at 06/28/21 0903 **OR** acetaminophen (TYLENOL) suppository 650 mg, 650 mg, Rectal, Q6H PRN, Posey Pronto, Vishal R, MD   albuterol (PROVENTIL) (2.5 MG/3ML) 0.083% nebulizer solution 3 mL, 3 mL, Inhalation, Q6H PRN, Fritzi Mandes, MD   apixaban (ELIQUIS) tablet 5 mg, 5 mg, Oral, BID, Manuella Ghazi, Vipul, MD, 5 mg at 06/29/21 2141   atorvastatin (LIPITOR) tablet 80 mg, 80 mg, Oral, Daily, Zada Finders R, MD, 80 mg at 06/29/21 1733   azithromycin (ZITHROMAX) tablet 250 mg, 250 mg, Oral, Q M,W,F, Fritzi Mandes, MD, 250 mg at 06/29/21 3748   benzonatate (TESSALON) capsule 200 mg, 200 mg, Oral, TID, Fritzi Mandes, MD, 200 mg at 06/29/21 1733   fluticasone (FLONASE) 50 MCG/ACT nasal spray 2 spray, 2 spray, Each Nare, Daily, Fritzi Mandes, MD, 2 spray at 06/29/21  1452   guaiFENesin-dextromethorphan (ROBITUSSIN DM) 100-10 MG/5ML syrup 10 mL, 10 mL, Oral, Q4H PRN, Fritzi Mandes, MD, 10 mL at 06/28/21 0904   ipratropium-albuterol (DUONEB) 0.5-2.5 (3) MG/3ML nebulizer solution 3 mL, 3 mL, Nebulization, Q4H PRN, Fritzi Mandes, MD, 3 mL at 06/29/21 1354   levalbuterol (XOPENEX) nebulizer solution 1.25 mg, 1.25 mg, Nebulization, TID, Fritzi Mandes, MD, 1.25 mg at 06/30/21 0809   LORazepam (ATIVAN) tablet  0.5 mg, 0.5 mg, Oral, TID PRN, Fritzi Mandes, MD, 0.5 mg at 06/30/21 0004   magnesium oxide (MAG-OX) tablet 400 mg, 400 mg, Oral, Daily, Fritzi Mandes, MD, 400 mg at 06/29/21 1451   MEDLINE mouth rinse, 15 mL, Mouth Rinse, BID, Zada Finders R, MD, 15 mL at 06/29/21 2141   melatonin tablet 5 mg, 5 mg, Oral, QHS, Zada Finders R, MD, 5 mg at 06/29/21 2140   metoprolol tartrate (LOPRESSOR) tablet 50 mg, 50 mg, Oral, BID, Zada Finders R, MD, 50 mg at 06/29/21 2141   montelukast (SINGULAIR) tablet 10 mg, 10 mg, Oral, QHS, Zada Finders R, MD, 10 mg at 06/29/21 2141   multivitamin with minerals tablet 1 tablet, 1 tablet, Oral, Daily, Fritzi Mandes, MD, 1 tablet at 06/29/21 1451   omega-3 acid ethyl esters (LOVAZA) capsule 1 g, 1 g, Oral, Daily, Fritzi Mandes, MD, 1 g at 06/29/21 1451   ondansetron (ZOFRAN) tablet 4 mg, 4 mg, Oral, Q6H PRN **OR** ondansetron (ZOFRAN) injection 4 mg, 4 mg, Intravenous, Q6H PRN, Posey Pronto, Cleaster Corin, MD   pantoprazole (PROTONIX) EC tablet 20 mg, 20 mg, Oral, Daily, Fritzi Mandes, MD, 20 mg at 06/29/21 1452   potassium chloride (KLOR-CON) packet 20 mEq, 20 mEq, Oral, Daily, Zada Finders R, MD, 20 mEq at 06/29/21 0940   predniSONE (DELTASONE) tablet 5 mg, 5 mg, Oral, Daily, Fritzi Mandes, MD   senna-docusate (Senokot-S) tablet 1 tablet, 1 tablet, Oral, QHS PRN, Lenore Cordia, MD, 1 tablet at 06/28/21 0904   sodium chloride flush (NS) 0.9 % injection 3 mL, 3 mL, Intravenous, Q12H, Zada Finders R, MD, 3 mL at 06/29/21 2142   tamsulosin (FLOMAX) capsule 0.4 mg,  0.4 mg, Oral, Daily, Zada Finders R, MD, 0.4 mg at 06/29/21 4097   torsemide (DEMADEX) tablet 20 mg, 20 mg, Oral, Daily, Fritzi Mandes, MD   traZODone (DESYREL) tablet 25 mg, 25 mg, Oral, QHS PRN, [place], Jan A, MD, 25 mg at 06/30/21 0127    ALLERGIES   Indomethacin     REVIEW OF SYSTEMS    Review of Systems:  Gen:  Denies  fever, sweats, chills weigh loss  HEENT: Denies blurred vision, double vision, ear pain, eye pain, hearing loss, nose bleeds, sore throat Cardiac:  No dizziness, chest pain or heaviness, chest tightness,edema Resp:   Denies cough or sputum porduction, shortness of breath,wheezing, hemoptysis,  Gi: Denies swallowing difficulty, stomach pain, nausea or vomiting, diarrhea, constipation, bowel incontinence Gu:  Denies bladder incontinence, burning urine Ext:   Denies Joint pain, stiffness or swelling Skin: Denies  skin rash, easy bruising or bleeding or hives Endoc:  Denies polyuria, polydipsia , polyphagia or weight change Psych:   Denies depression, insomnia or hallucinations   Other:  All other systems negative   VS: BP 120/73 (BP Location: Left Arm)   Pulse (!) 144   Temp (!) 97.1 F (36.2 C)   Resp 20   Ht _0  (1.727 m)   Wt 71.5 kg   SpO2 91%   BMI 23.96 kg/m      PHYSICAL EXAM    GENERAL:NAD, no fevers, chills, no weakness no fatigue HEAD: Normocephalic, atraumatic.  EYES: Pupils equal, round, reactive to light. Extraocular muscles intact. No scleral icterus.  MOUTH: Moist mucosal membrane. Dentition intact. No abscess noted.  EAR, NOSE, THROAT: Clear without exudates. No external lesions.  NECK: Supple. No thyromegaly. No nodules. No JVD.  PULMONARY: mild rhonchi b/l CARDIOVASCULAR: S1 and S2. Regular rate and rhythm. No  murmurs, rubs, or gallops. No edema. Pedal pulses 2+ bilaterally.  GASTROINTESTINAL: Soft, nontender, nondistended. No masses. Positive bowel sounds. No hepatosplenomegaly.  MUSCULOSKELETAL: No swelling, clubbing, or  edema. Range of motion full in all extremities.  NEUROLOGIC: Cranial nerves II through XII are intact. No gross focal neurological deficits. Sensation intact. Reflexes intact.  SKIN: No ulceration, lesions, rashes, or cyanosis. Skin warm and dry. Turgor intact.  PSYCHIATRIC: Mood, affect within normal limits. The patient is awake, alert and oriented x 3. Insight, judgment intact.       IMAGING    CT Chest Wo Contrast  Result Date: 07/03/2021 CLINICAL DATA:  87 year old male with respiratory distress. EXAM: CT CHEST WITHOUT CONTRAST TECHNIQUE: Multidetector CT imaging of the chest was performed following the standard protocol without IV contrast. COMPARISON:  Chest radiograph dated 07/03/2021 and CT dated 01/20/2021. FINDINGS: Evaluation of this exam is limited in the absence of intravenous contrast. Cardiovascular: There is no cardiomegaly or pericardial effusion. Advanced 3 vessel coronary vascular calcification postsurgical changes of CABG. There is moderate atherosclerotic calcification of the thoracic aorta. No aneurysmal dilatation. The central pulmonary arteries are grossly unremarkable. Mediastinum/Nodes: No mediastinal adenopathy. Evaluation of hilar lymph nodes is very limited due to consolidative changes of the lung. The esophagus is grossly unremarkable. No mediastinal fluid collection. Lungs/Pleura: Large right pleural effusion, increased in size since the prior CT. There is near complete compressive atelectasis of the right middle and right lower lobes versus pneumonia. There is partial compressive atelectasis of the right upper lobe. Several small nodular densities noted in the left upper lobe. There is no pneumothorax. There is narrowing of the right upper lobe bronchus as well as near complete occlusion of the right middle and right lower lobe bronchi. Upper Abdomen: No acute abnormality. Musculoskeletal: Osteopenia with degenerative changes of the spine. Median sternotomy wires. No acute  osseous pathology. Cervical ACDF. IMPRESSION: 1. Large right pleural effusion, increased in size since the prior CT. Thoracentesis may provide additional diagnostic value as well as symptomatic relief. 2. Near complete compressive atelectasis of the right middle and right lower lobes versus pneumonia. 3. Aortic Atherosclerosis (ICD10-I70.0). Electronically Signed   By: Anner Crete M.D.   On: 06/30/2021 23:05   DG Chest Port 1 View  Addendum Date: 06/29/2021   ADDENDUM REPORT: 06/29/2021 13:37 ADDENDUM: These results were called by telephone at the time of interpretation on 06/29/2021 at 1:37 pm to provider Ica Daye , who verbally acknowledged these results. Electronically Signed   By: Franchot Gallo M.D.   On: 06/29/2021 13:37   Result Date: 06/29/2021 CLINICAL DATA:  Acute respiratory failure with hypoxia EXAM: PORTABLE CHEST 1 VIEW COMPARISON:  06/28/2021 FINDINGS: Right pneumothorax approximately 30% has developed since the prior study. There is a small right effusion and right lower lobe atelectasis. Postop CABG. Mild cardiac enlargement without heart failure. Mild left lower lobe air airspace disease. IMPRESSION: 30% right pneumothorax. Small right effusion and right lower lobe atelectasis. Electronically Signed: By: Franchot Gallo M.D. On: 06/29/2021 13:32   DG Chest Port 1 View  Result Date: 06/28/2021 CLINICAL DATA:  87 year old male with cough. Right side empyema status post right side thoracentesis two days ago. EXAM: PORTABLE CHEST 1 VIEW COMPARISON:  Portable chest 06/26/2021 and earlier. FINDINGS: Portable AP upright view at 0939 hours. Right lung ventilation remains substantially improved compared to the CT on 06/18/2021, with possible small volume residual effusion at the right lung base. No pneumothorax or pulmonary edema. Stable cardiac size and mediastinal contours.  Prior CABG. Stable and negative left lung. Negative visible bowel gas pattern. Stable visualized osseous  structures. IMPRESSION: 1. Substantially improved right lung ventilation following thoracentesis two days ago with possible small residual right pleural effusion. 2. No new cardiopulmonary abnormality. Electronically Signed   By: Genevie Ann M.D.   On: 06/28/2021 10:15   DG Chest Port 1 View  Result Date: 06/26/2021 CLINICAL DATA:  Status post right thoracentesis EXAM: PORTABLE CHEST 1 VIEW COMPARISON:  06/27/2021 FINDINGS: Unchanged cardiomegaly. No pulmonary vascular congestion. Postsurgical changes of CABG again seen. Interval decrease of right pleural effusion. No pneumothorax. IMPRESSION: No pneumothorax status post right thoracentesis. Electronically Signed   By: Miachel Roux M.D.   On: 06/26/2021 14:55   DG Chest Portable 1 View  Result Date: 06/23/2021 CLINICAL DATA:  Shortness of breath cough EXAM: PORTABLE CHEST 1 VIEW COMPARISON:  CT 02/17/2021, radiograph 12/06/2020 FINDINGS: Chronic right pleural effusion appears similar to slightly increased from comparison radiograph 12/14/2020. Some increasing left basilar effusion is noted as well. More bandlike areas of opacity suggestive of atelectasis or scarring. There is diffuse hazy interstitial opacity with pulmonary vascular congestion as well as fissural thickening suggesting some superimposed pulmonary edema. Partial obscuration of the cardiomediastinal contours. Visible contours are similar to comparison prior counting for differences in technique. Prior sternotomy and CABG. The aorta is calcified. The remaining cardiomediastinal contours are unremarkable. Degenerative changes are present in the imaged spine and shoulders. No other significant osseous abnormality. Telemetry leads overlie the chest. IMPRESSION: 1. Suspect some slight increase in size of a chronic right pleural effusion with increasing left basilar effusion as well. Additional features pulmonary vascular congestion and likely developing interstitial edema could suggest CHF/volume  overload 2. Underlying airspace disease in the regions of more coalescent opacity is difficult to fully exclude. 3. Prior sternotomy and CABG. 4.  Aortic Atherosclerosis (ICD10-I70.0). Electronically Signed   By: Lovena Le M.D.   On: 06/28/2021 22:03   US THORACENTESIS ASP PLEURAL SPACE W/IMG GUIDE  Result Date: 06/26/2021 INDICATION: Acute respiratory distress Right pleural effusion EXAM: ULTRASOUND GUIDED RIGHT THORACENTESIS MEDICATIONS: None. COMPLICATIONS: None immediate. PROCEDURE: An ultrasound guided thoracentesis was thoroughly discussed with the patient and questions answered. The benefits, risks, alternatives and complications were also discussed. The patient understands and wishes to proceed with the procedure. Written consent was obtained. Ultrasound was performed to localize and mark an adequate pocket of fluid in the right chest. The area was then prepped and draped in the normal sterile fashion. 1% Lidocaine was used for local anesthesia. Under ultrasound guidance a 6 Fr Safe-T-Centesis catheter was introduced. Thoracentesis was performed. The catheter was removed and a dressing applied. FINDINGS: A total of approximately 1.1 L of bloody fluid was removed. Samples were sent to the laboratory as requested by the clinical team. IMPRESSION: Successful ultrasound guided right thoracentesis yielding 1.1 L of pleural fluid. Electronically Signed   By: Miachel Roux M.D.   On: 06/26/2021 14:48         ASSESSMENT/PLAN   Acute on chronic hypoxemic respiratory failure              -Due to complete atelectasis secondary to large right pleural effusion               -this may be due to CHF as his BNP was slightly elevated             -patient with malignant effusion and pneumotorax  Divide - RECOMMEND COMFORT CARE ONLY  Right pleural effusion -moderate to large  S/p thoracentesis - fluid profile with bloodly pleural fluid possibly due to eliquis BID          - patient  will need repeat imaging and may need repeat CT chest and may need pigtail catheter drainage          - he has personal hx of cancer and advanced copd with increased risk for carcinoma - cytology is in process          -glucose is 90 which point away from infectious etiology but pH was not done Malignant pleural effusion-notified oncology and attending physician.  Advanced COPD with chronic hypoxemia     -agree with solumedrol - will decrease to 40 daily      - will start xopenex and dc remainder of his nebulizer therapy     Complete atelectasis of right lung    - this may be too large for incentive spirometry     - will upgrade to metaneb daily with saline    Right pneumothorax-30% -Patient with overall poor prognosis-recommend palliative involvement including consideration of tunneled permanent pleural catheter for relief of pneumothorax and recurrent effusion with malignancy.    Thank you for allowing me to participate in the care of this patient.   Patient/Family are satisfied with care plan and all questions have been answered.   This document was prepared using Dragon voice recognition software and may include unintentional dictation errors.     Ottie Glazier, M.D.  Division of [facility]

## 2021-07-14 ENCOUNTER — Ambulatory Visit: Payer: Medicare Other

## 2021-07-18 NOTE — Death Summary Note (Signed)
DEATH SUMMARY   Patient Details  Name: James [name] MRN: [id number] DOB: 03/02/1929  Admission/Discharge Information   Admit Date:  Jul 16, 2021  Date of Death: Date of Death: 07/22/21  Time of Death: Time of Death: 0150  Length of Stay: 5  Referring Physician: Birdie Sons, MD   Reason(s) for Hospitalization   shortness of breath Diagnoses  Preliminary cause of death:  Acute on chronic hypoxic respiratory failure secondary to larger right-sided pleural effusion associated with compressive atelectasis. Malignant pleural effusion positive adenocarcinoma acute on chronic systolic heart failure  Secondary Diagnoses (including complications and co-morbidities):  Principal Problem:   Acute respiratory failure with hypoxia (HCC) Active Problems:   CAD (coronary artery disease)   Essential (primary) hypertension   Hyperlipidemia   PAF (paroxysmal atrial fibrillation) (HCC)   Acute on chronic HFrEF (heart failure with reduced ejection fraction) (HCC)   History of CVA (cerebrovascular accident)   Primary adenocarcinoma of lower lobe of right lung (HCC)   CKD (chronic kidney disease), stage III (HCC)   Pleural effusion on right   Palliative care encounter   Brief Hospital Course (including significant findings, care, treatment, and services provided and events leading to death)  87 y.o. male with medical history significant for CAD s/p CABG, HFrEF (EF 40%), PAF on Eliquis, COPD on chronic prednisone 5 mg daily, RLL lung adenocarcinoma s/p radiation therapy, history of CVA, CKD stage IIIa, HTN, HLD, and OSA (not using CPAP, on 2-3 L O2 via [place] at night) admitted for acute hypoxic respiratory failure likely due to large right-sided pleural effusion.   Acute on chronic respiratory failure with hypoxia secondary to large right pleural effusion associated with compressive atelectasis of right middle and right lower lobes: Malignant Pleural effusion/ cytology positive for  adenocarcinoma --Large right pleural effusion seen on CT imaging.  Has history of right lower lobe adenocarcinoma treated with radiation therapy -- seen by oncology. Carries a very poor prognosis. Oncology nurse practitioner and pulmonology along with me discussed with [name] and son in the room. They do understand patient is not doing well may not survive hospital stay. Wife and son are in agreement with comfort care/hospice.   Acute on chronic HFrEF:   COPD with acute exacerbation/end-stage   AKI on CKD stage IIIa: Suspect related to vascular congestion and acute hypoxic respiratory failure.    Creatinine 1.1 today   Paroxysmal atrial fibrillation:   CAD s/p CABG:   Hypertension:   History of CVA:   Hyperlipidemia CODE STATUS: DNR patient was under full comfort care. He died peacefully in the hospital with family at bedside on 715 22 at 0150 hours      Pertinent Labs and Studies  Significant Diagnostic Studies CT Chest Wo Contrast  Result Date: 16-Jul-2021 CLINICAL DATA:  87 year old male with respiratory distress. EXAM: CT CHEST WITHOUT CONTRAST TECHNIQUE: Multidetector CT imaging of the chest was performed following the standard protocol without IV contrast. COMPARISON:  Chest radiograph dated Jul 16, 2021 and CT dated 01/20/2021. FINDINGS: Evaluation of this exam is limited in the absence of intravenous contrast. Cardiovascular: There is no cardiomegaly or pericardial effusion. Advanced 3 vessel coronary vascular calcification postsurgical changes of CABG. There is moderate atherosclerotic calcification of the thoracic aorta. No aneurysmal dilatation. The central pulmonary arteries are grossly unremarkable. Mediastinum/Nodes: No mediastinal adenopathy. Evaluation of hilar lymph nodes is very limited due to consolidative changes of the lung. The esophagus is grossly unremarkable. No mediastinal fluid collection. Lungs/Pleura: Large right pleural effusion, increased in  size since the prior CT. There is near complete compressive atelectasis of the right middle and right lower lobes versus pneumonia. There is partial compressive atelectasis of the right upper lobe. Several small nodular densities noted in the left upper lobe. There is no pneumothorax. There is narrowing of the right upper lobe bronchus as well as near complete occlusion of the right middle and right lower lobe bronchi. Upper Abdomen: No acute abnormality. Musculoskeletal: Osteopenia with degenerative changes of the spine. Median sternotomy wires. No acute osseous pathology. Cervical ACDF. IMPRESSION: 1. Large right pleural effusion, increased in size since the prior CT. Thoracentesis may provide additional diagnostic value as well as symptomatic relief. 2. Near complete compressive atelectasis of the right middle and right lower lobes versus pneumonia. 3. Aortic Atherosclerosis (ICD10-I70.0). Electronically Signed   By: Anner Crete M.D.   On: 06/27/2021 23:05   DG Chest Port 1 View  Result Date: 06/30/2021 CLINICAL DATA:  Pleural effusion EXAM: PORTABLE CHEST 1 VIEW COMPARISON:  06/29/2021 FINDINGS: Right pneumothorax is again noted, unchanged, approximately 30% or greater. Right base atelectasis. Left base atelectasis. Heart is normal size. Prior CABG. IMPRESSION: Moderate to large right pneumothorax of approximately 30% or more, stable since prior study. Bibasilar atelectasis. Electronically Signed   By: Rolm Baptise M.D.   On: 06/30/2021 08:47   DG Chest Port 1 View  Addendum Date: 06/29/2021   ADDENDUM REPORT: 06/29/2021 13:37 ADDENDUM: These results were called by telephone at the time of interpretation on 06/29/2021 at 1:37 pm to provider FUAD ALESKEROV , who verbally acknowledged these results. Electronically Signed   By: Franchot Gallo M.D.   On: 06/29/2021 13:37   Result Date: 06/29/2021 CLINICAL DATA:  Acute respiratory failure with hypoxia EXAM: PORTABLE CHEST 1 VIEW COMPARISON:  06/28/2021  FINDINGS: Right pneumothorax approximately 30% has developed since the prior study. There is a small right effusion and right lower lobe atelectasis. Postop CABG. Mild cardiac enlargement without heart failure. Mild left lower lobe air airspace disease. IMPRESSION: 30% right pneumothorax. Small right effusion and right lower lobe atelectasis. Electronically Signed: By: Franchot Gallo M.D. On: 06/29/2021 13:32   DG Chest Port 1 View  Result Date: 06/28/2021 CLINICAL DATA:  87 year old male with cough. Right side empyema status post right side thoracentesis two days ago. EXAM: PORTABLE CHEST 1 VIEW COMPARISON:  Portable chest 06/26/2021 and earlier. FINDINGS: Portable AP upright view at 0939 hours. Right lung ventilation remains substantially improved compared to the CT on 07/03/2021, with possible small volume residual effusion at the right lung base. No pneumothorax or pulmonary edema. Stable cardiac size and mediastinal contours. Prior CABG. Stable and negative left lung. Negative visible bowel gas pattern. Stable visualized osseous structures. IMPRESSION: 1. Substantially improved right lung ventilation following thoracentesis two days ago with possible small residual right pleural effusion. 2. No new cardiopulmonary abnormality. Electronically Signed   By: Genevie Ann M.D.   On: 06/28/2021 10:15   DG Chest Port 1 View  Result Date: 06/26/2021 CLINICAL DATA:  Status post right thoracentesis EXAM: PORTABLE CHEST 1 VIEW COMPARISON:  06/30/2021 FINDINGS: Unchanged cardiomegaly. No pulmonary vascular congestion. Postsurgical changes of CABG again seen. Interval decrease of right pleural effusion. No pneumothorax. IMPRESSION: No pneumothorax status post right thoracentesis. Electronically Signed   By: Miachel Roux M.D.   On: 06/26/2021 14:55   DG Chest Portable 1 View  Result Date: 06/25/2021 CLINICAL DATA:  Shortness of breath cough EXAM: PORTABLE CHEST 1 VIEW COMPARISON:  CT 02/17/2021, radiograph 12/06/2020  FINDINGS: Chronic right pleural effusion appears similar to slightly increased from comparison radiograph 12/14/2020. Some increasing left basilar effusion is noted as well. More bandlike areas of opacity suggestive of atelectasis or scarring. There is diffuse hazy interstitial opacity with pulmonary vascular congestion as well as fissural thickening suggesting some superimposed pulmonary edema. Partial obscuration of the cardiomediastinal contours. Visible contours are similar to comparison prior counting for differences in technique. Prior sternotomy and CABG. The aorta is calcified. The remaining cardiomediastinal contours are unremarkable. Degenerative changes are present in the imaged spine and shoulders. No other significant osseous abnormality. Telemetry leads overlie the chest. IMPRESSION: 1. Suspect some slight increase in size of a chronic right pleural effusion with increasing left basilar effusion as well. Additional features pulmonary vascular congestion and likely developing interstitial edema could suggest CHF/volume overload 2. Underlying airspace disease in the regions of more coalescent opacity is difficult to fully exclude. 3. Prior sternotomy and CABG. 4.  Aortic Atherosclerosis (ICD10-I70.0). Electronically Signed   By: Lovena Le M.D.   On: 07/04/2021 22:03   US THORACENTESIS ASP PLEURAL SPACE W/IMG GUIDE  Result Date: 06/26/2021 INDICATION: Acute respiratory distress Right pleural effusion EXAM: ULTRASOUND GUIDED RIGHT THORACENTESIS MEDICATIONS: None. COMPLICATIONS: None immediate. PROCEDURE: An ultrasound guided thoracentesis was thoroughly discussed with the patient and questions answered. The benefits, risks, alternatives and complications were also discussed. The patient understands and wishes to proceed with the procedure. Written consent was obtained. Ultrasound was performed to localize and mark an adequate pocket of fluid in the right chest. The area was then prepped and draped  in the normal sterile fashion. 1% Lidocaine was used for local anesthesia. Under ultrasound guidance a 6 Fr Safe-T-Centesis catheter was introduced. Thoracentesis was performed. The catheter was removed and a dressing applied. FINDINGS: A total of approximately 1.1 L of bloody fluid was removed. Samples were sent to the laboratory as requested by the clinical team. IMPRESSION: Successful ultrasound guided right thoracentesis yielding 1.1 L of pleural fluid. Electronically Signed   By: Miachel Roux M.D.   On: 06/26/2021 14:48    Microbiology No results found for this or any previous visit (from the past 240 hour(s)).  Lab Basic Metabolic Panel: No results for input(s): NA, K, CL, CO2, GLUCOSE, BUN, CREATININE, CALCIUM, MG, PHOS in the last 168 hours. Liver Function Tests: No results for input(s): AST, ALT, ALKPHOS, BILITOT, PROT, ALBUMIN in the last 168 hours. No results for input(s): LIPASE, AMYLASE in the last 168 hours. No results for input(s): AMMONIA in the last 168 hours. CBC: No results for input(s): WBC, NEUTROABS, HGB, HCT, MCV, PLT in the last 168 hours. Cardiac Enzymes: No results for input(s): CKTOTAL, CKMB, CKMBINDEX, TROPONINI in the last 168 hours. Sepsis Labs: No results for input(s): PROCALCITON, WBC, LATICACIDVEN in the last 168 hours.  Procedures/Operations     Fritzi Mandes 07/15/2021, 4:14 PM

## 2021-07-18 DEATH — deceased

## 2021-08-10 LAB — ACID FAST CULTURE WITH REFLEXED SENSITIVITIES (MYCOBACTERIA): Acid Fast Culture: NEGATIVE

## 2022-01-25 ENCOUNTER — Ambulatory Visit: Payer: PRIVATE HEALTH INSURANCE

## 2022-01-30 ENCOUNTER — Ambulatory Visit: Payer: PRIVATE HEALTH INSURANCE | Admitting: Radiation Oncology
